# Patient Record
Sex: Female | Born: 1965
Health system: Southern US, Community
[De-identification: ages and names within clinical notes are randomized; demographics above are authoritative.]

## PROBLEM LIST (undated history)

## (undated) DIAGNOSIS — M069 Rheumatoid arthritis, unspecified: Secondary | ICD-10-CM

## (undated) DIAGNOSIS — I10 Essential (primary) hypertension: Secondary | ICD-10-CM

## (undated) DIAGNOSIS — F32A Depression, unspecified: Secondary | ICD-10-CM

## (undated) DIAGNOSIS — M810 Age-related osteoporosis without current pathological fracture: Secondary | ICD-10-CM

## (undated) DIAGNOSIS — D649 Anemia, unspecified: Secondary | ICD-10-CM

## (undated) DIAGNOSIS — F329 Major depressive disorder, single episode, unspecified: Secondary | ICD-10-CM

## (undated) DIAGNOSIS — A599 Trichomoniasis, unspecified: Secondary | ICD-10-CM

## (undated) DIAGNOSIS — G8929 Other chronic pain: Secondary | ICD-10-CM

## (undated) DIAGNOSIS — F4310 Post-traumatic stress disorder, unspecified: Secondary | ICD-10-CM

## (undated) DIAGNOSIS — J449 Chronic obstructive pulmonary disease, unspecified: Secondary | ICD-10-CM

## (undated) DIAGNOSIS — R911 Solitary pulmonary nodule: Secondary | ICD-10-CM

## (undated) HISTORY — DX: Trichomoniasis, unspecified: A59.9

## (undated) HISTORY — PX: TUBAL LIGATION: SHX77

## (undated) HISTORY — PX: ENDOMETRIAL ABLATION: SHX621

## (undated) HISTORY — PX: WISDOM TOOTH EXTRACTION: SHX21

## (undated) HISTORY — DX: Post-traumatic stress disorder, unspecified: F43.10

---

## 1998-01-30 ENCOUNTER — Inpatient Hospital Stay (HOSPITAL_COMMUNITY): Admission: AD | Admit: 1998-01-30 | Discharge: 1998-02-02 | Payer: Self-pay | Admitting: Obstetrics

## 1999-03-04 ENCOUNTER — Inpatient Hospital Stay (HOSPITAL_COMMUNITY): Admission: AD | Admit: 1999-03-04 | Discharge: 1999-03-08 | Payer: Self-pay | Admitting: Otolaryngology

## 2003-02-20 ENCOUNTER — Encounter: Admission: RE | Admit: 2003-02-20 | Discharge: 2003-02-20 | Payer: Self-pay | Admitting: Family Medicine

## 2003-04-06 ENCOUNTER — Emergency Department (HOSPITAL_COMMUNITY): Admission: EM | Admit: 2003-04-06 | Discharge: 2003-04-06 | Payer: Self-pay | Admitting: Emergency Medicine

## 2004-01-22 ENCOUNTER — Ambulatory Visit (HOSPITAL_COMMUNITY): Admission: RE | Admit: 2004-01-22 | Discharge: 2004-01-22 | Payer: Self-pay | Admitting: Family Medicine
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
## 2005-12-28 ENCOUNTER — Emergency Department (HOSPITAL_COMMUNITY): Admission: RE | Admit: 2005-12-28 | Discharge: 2005-12-28 | Payer: Self-pay | Admitting: Emergency Medicine

## 2006-01-26 ENCOUNTER — Emergency Department (HOSPITAL_COMMUNITY): Admission: EM | Admit: 2006-01-26 | Discharge: 2006-01-26 | Payer: Self-pay | Admitting: Emergency Medicine

## 2006-07-02 ENCOUNTER — Emergency Department (HOSPITAL_COMMUNITY): Admission: EM | Admit: 2006-07-02 | Discharge: 2006-07-02 | Payer: Self-pay | Admitting: Emergency Medicine

## 2006-07-14 ENCOUNTER — Ambulatory Visit: Payer: Self-pay | Admitting: Orthopedic Surgery

## 2006-07-28 ENCOUNTER — Emergency Department (HOSPITAL_COMMUNITY): Admission: EM | Admit: 2006-07-28 | Discharge: 2006-07-28 | Payer: Self-pay | Admitting: Emergency Medicine

## 2006-08-04 ENCOUNTER — Ambulatory Visit (HOSPITAL_COMMUNITY): Admission: RE | Admit: 2006-08-04 | Discharge: 2006-08-04 | Payer: Self-pay | Admitting: Family Medicine

## 2006-08-16 ENCOUNTER — Ambulatory Visit (HOSPITAL_COMMUNITY): Admission: RE | Admit: 2006-08-16 | Discharge: 2006-08-16 | Payer: Self-pay | Admitting: Family Medicine

## 2006-09-01 ENCOUNTER — Emergency Department (HOSPITAL_COMMUNITY): Admission: EM | Admit: 2006-09-01 | Discharge: 2006-09-01 | Payer: Self-pay | Admitting: Emergency Medicine

## 2006-10-24 ENCOUNTER — Emergency Department (HOSPITAL_COMMUNITY): Admission: EM | Admit: 2006-10-24 | Discharge: 2006-10-24 | Payer: Self-pay | Admitting: Emergency Medicine

## 2006-11-17 ENCOUNTER — Emergency Department (HOSPITAL_COMMUNITY): Admission: EM | Admit: 2006-11-17 | Discharge: 2006-11-17 | Payer: Self-pay | Admitting: *Deleted

## 2007-04-22 ENCOUNTER — Emergency Department (HOSPITAL_COMMUNITY): Admission: EM | Admit: 2007-04-22 | Discharge: 2007-04-22 | Payer: Self-pay | Admitting: Emergency Medicine

## 2008-01-10 IMAGING — CR DG WRIST COMPLETE 3+V*L*
3 series · 3 of 3 positions shown · non-contrast
Comparison: none

HISTORY: Left wrist pain and swelling

LEFT WRIST 4 VIEWS:
No fracture, dislocation, or bone destruction.
Joint spaces preserved.  
Mineralization normal. 
Soft tissue swelling overlies dorsum of hand on lateral view.

[view not recorded (1 of 3)]
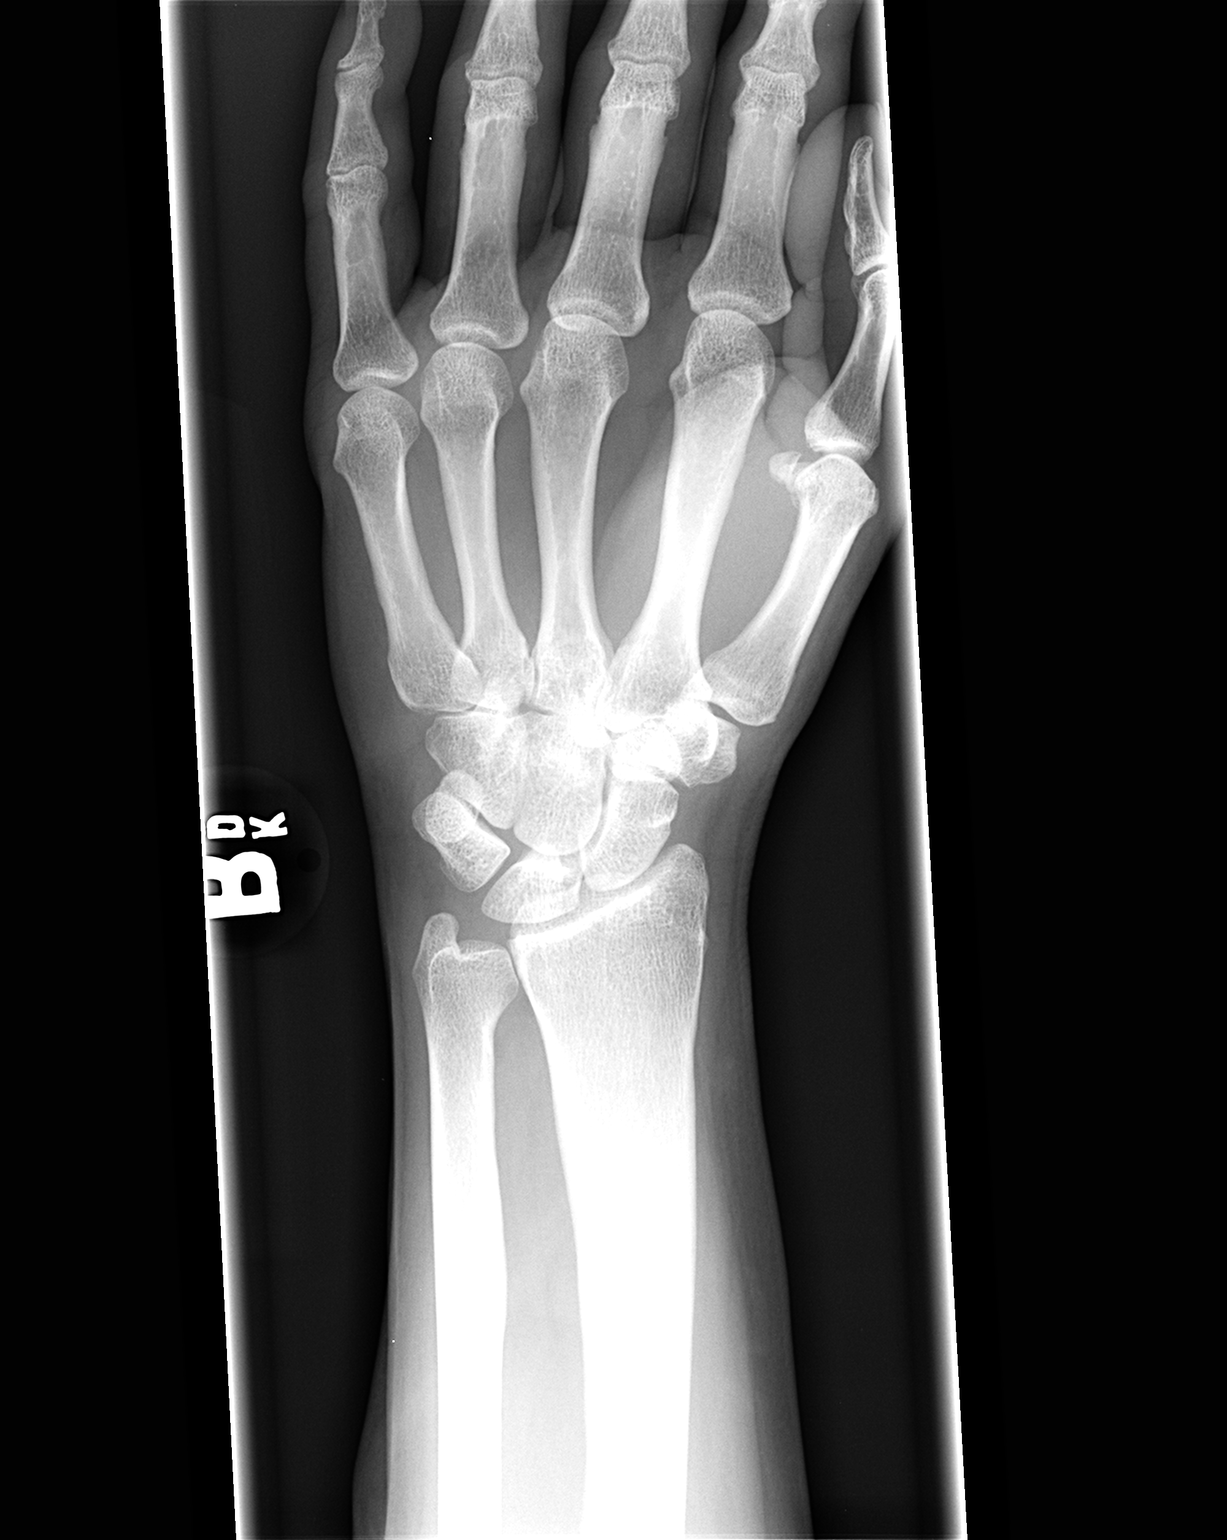

[view not recorded (2 of 3)]
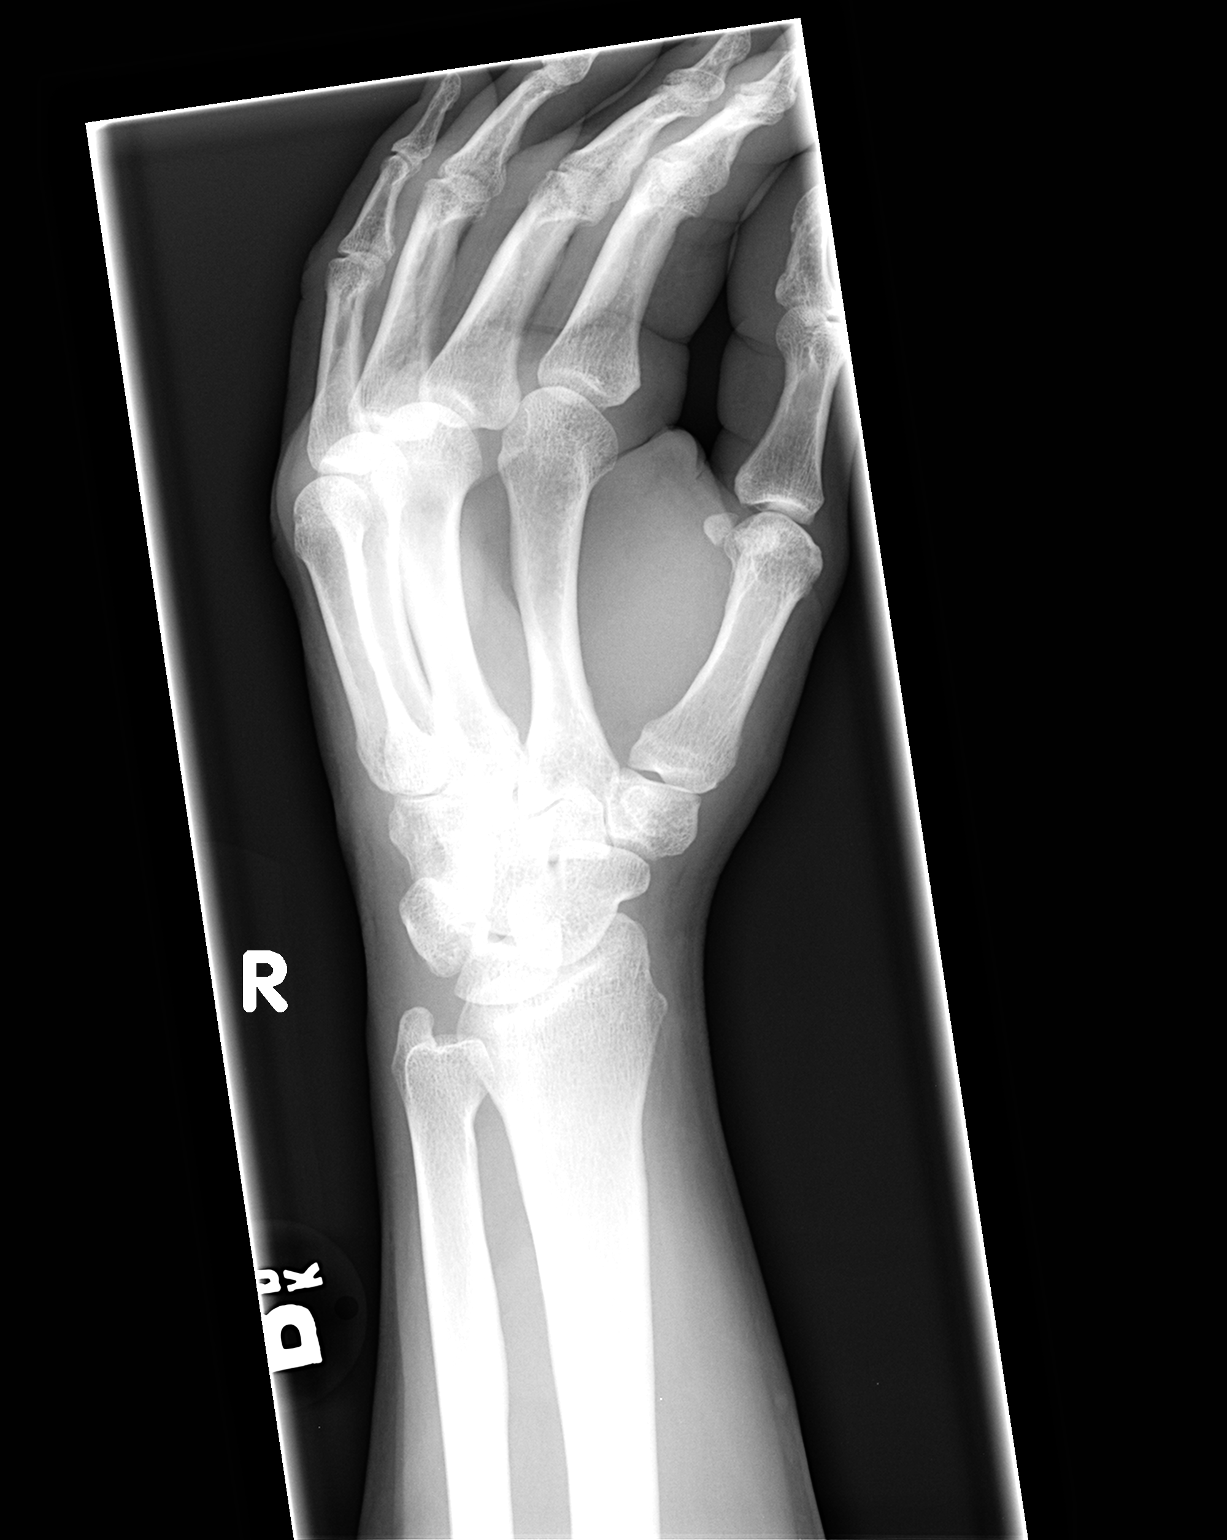

[view not recorded (3 of 3)]
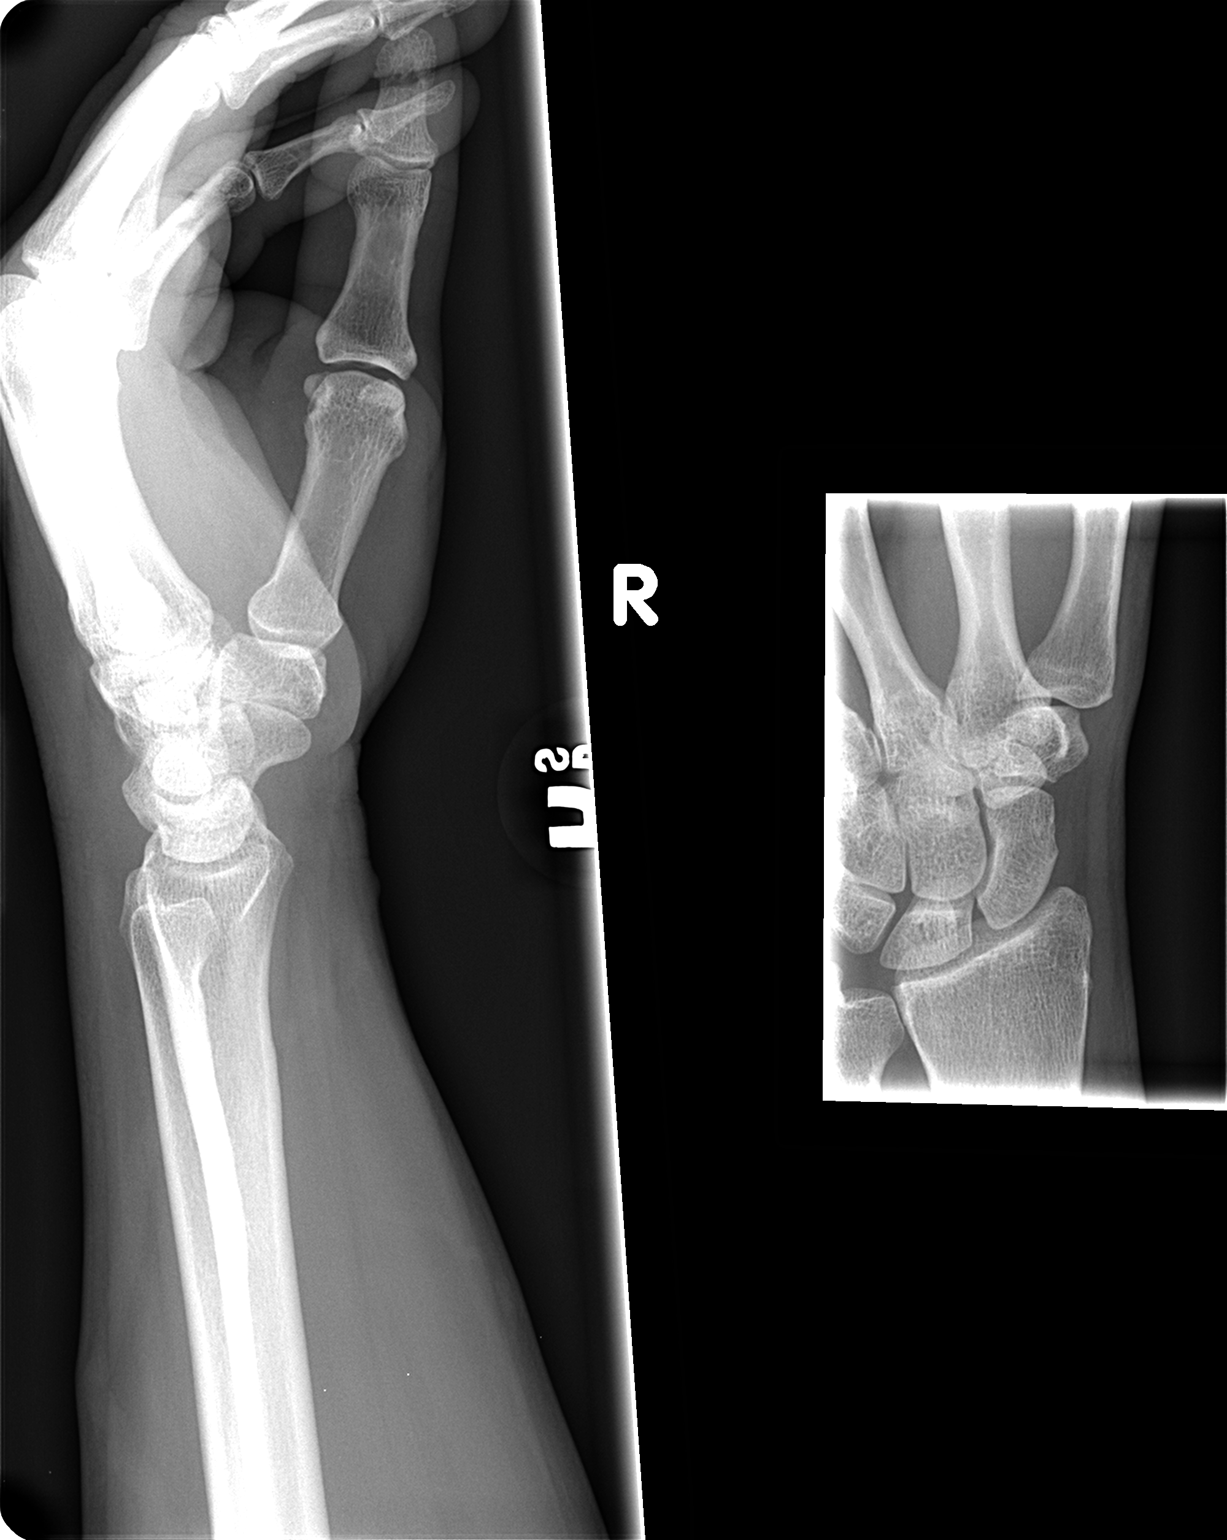

[3 of 3 positions shown; findings below may reference images not displayed]

IMPRESSION: No acute bony abnormalities.

## 2008-03-28 ENCOUNTER — Emergency Department (HOSPITAL_COMMUNITY): Admission: EM | Admit: 2008-03-28 | Discharge: 2008-03-28 | Payer: Self-pay | Admitting: Emergency Medicine

## 2008-05-08 ENCOUNTER — Ambulatory Visit (HOSPITAL_COMMUNITY): Admission: RE | Admit: 2008-05-08 | Discharge: 2008-05-08 | Payer: Self-pay | Admitting: Family Medicine

## 2008-08-16 IMAGING — CR DG HAND COMPLETE 3+V*R*
3 series · 3 of 3 positions shown · non-contrast
Comparison: none

CLINICAL DATA: 41 year-old with arthritis. Bilateral hand and wrist pain and swelling, left greater than right, off and on since [DATE]. No known injury. Possible rheumatoid arthritis.
 RIGHT WRIST ? 4 VIEW:

[view not recorded (1 of 3)]
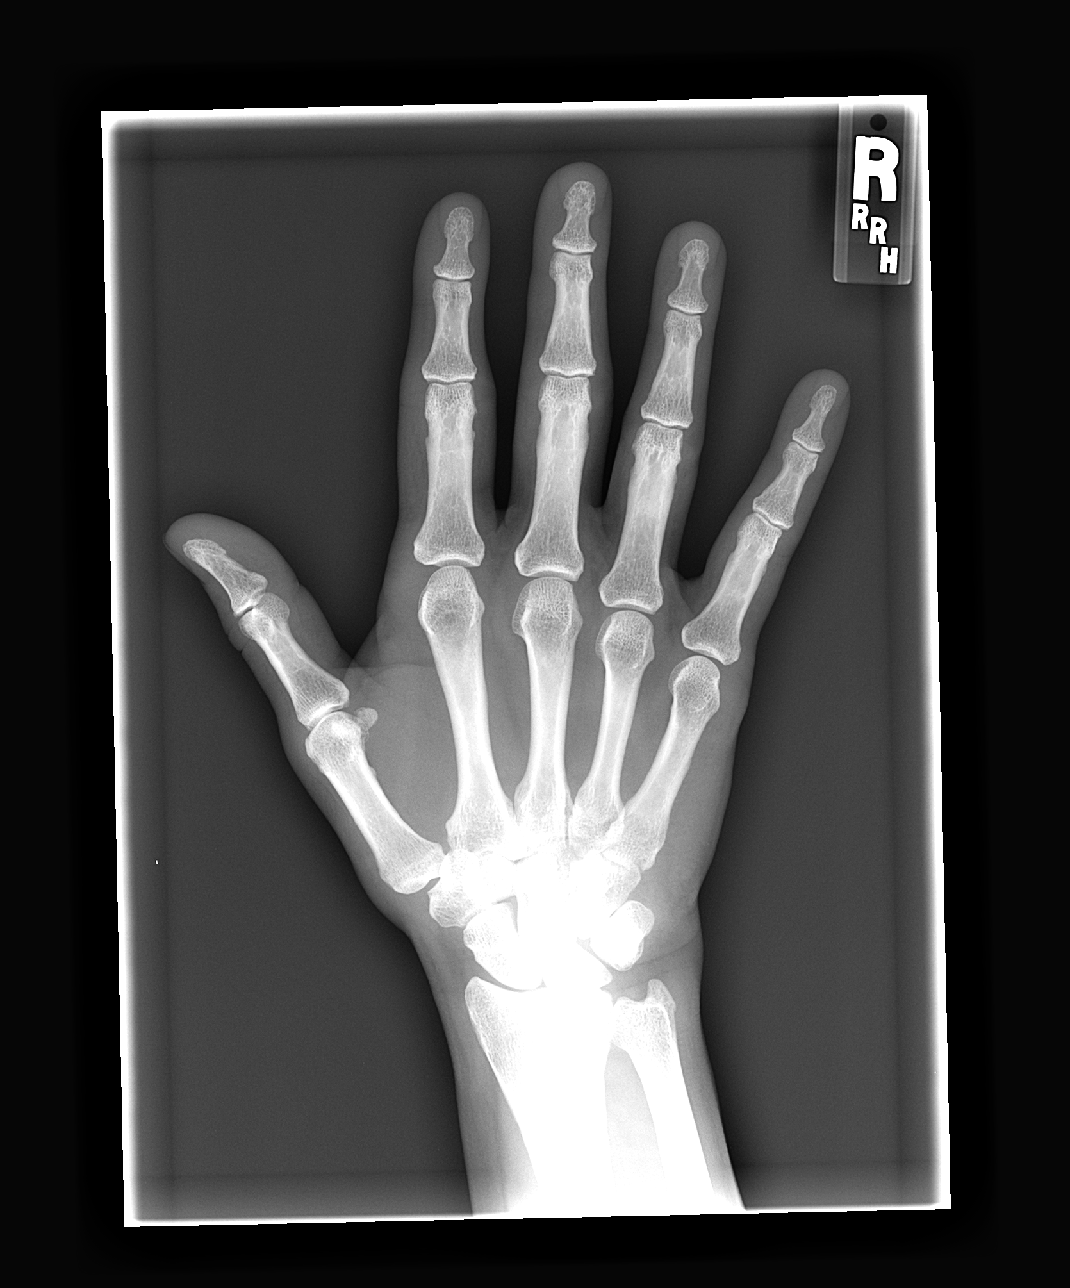

[view not recorded (2 of 3)]
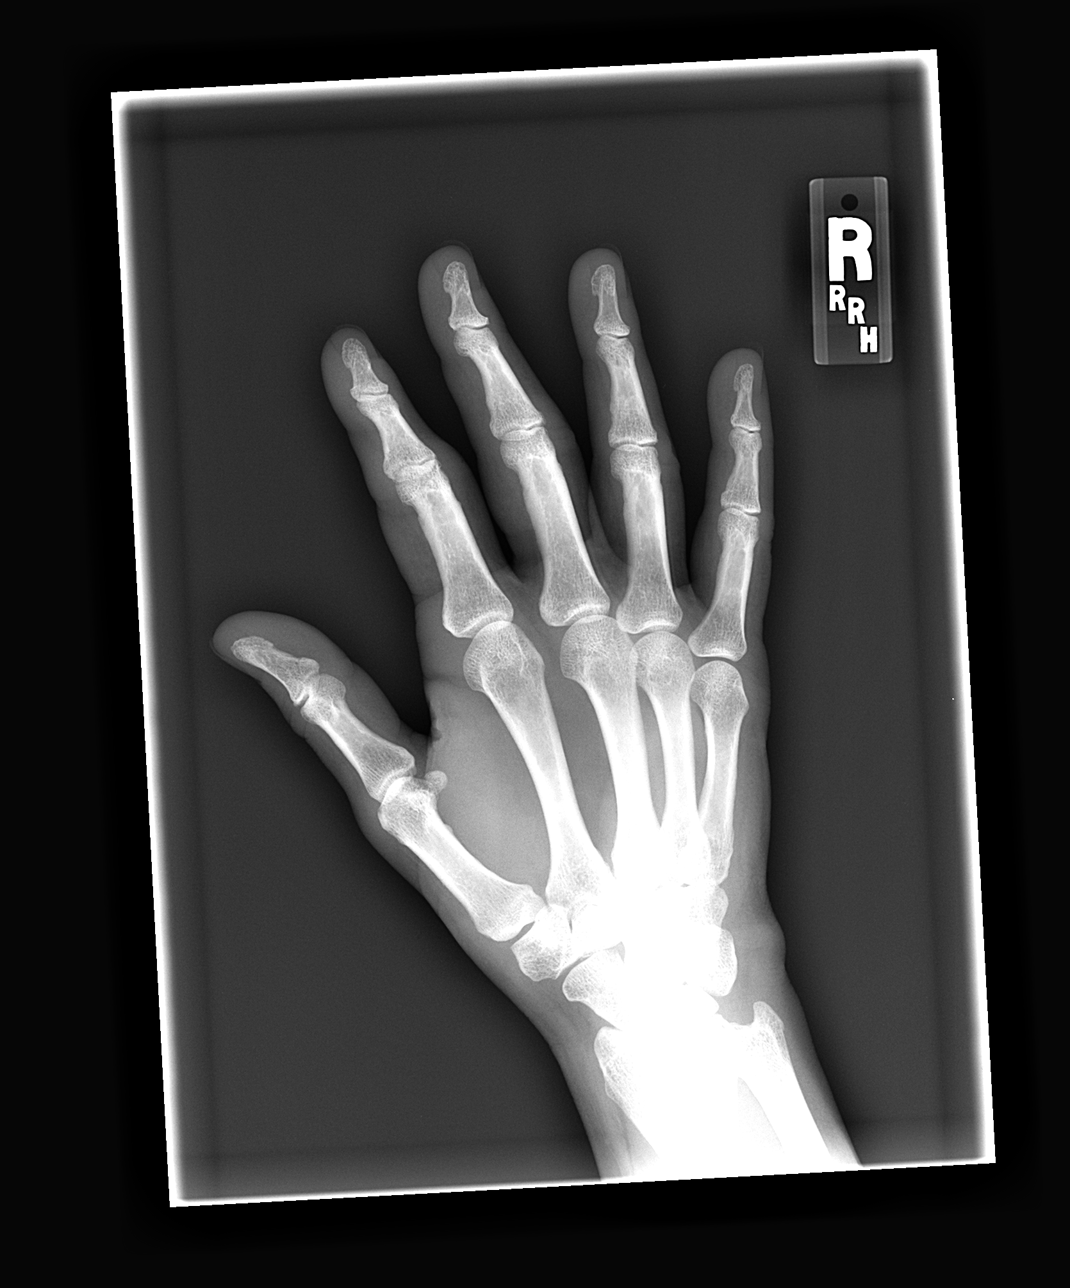

[view not recorded (3 of 3)]
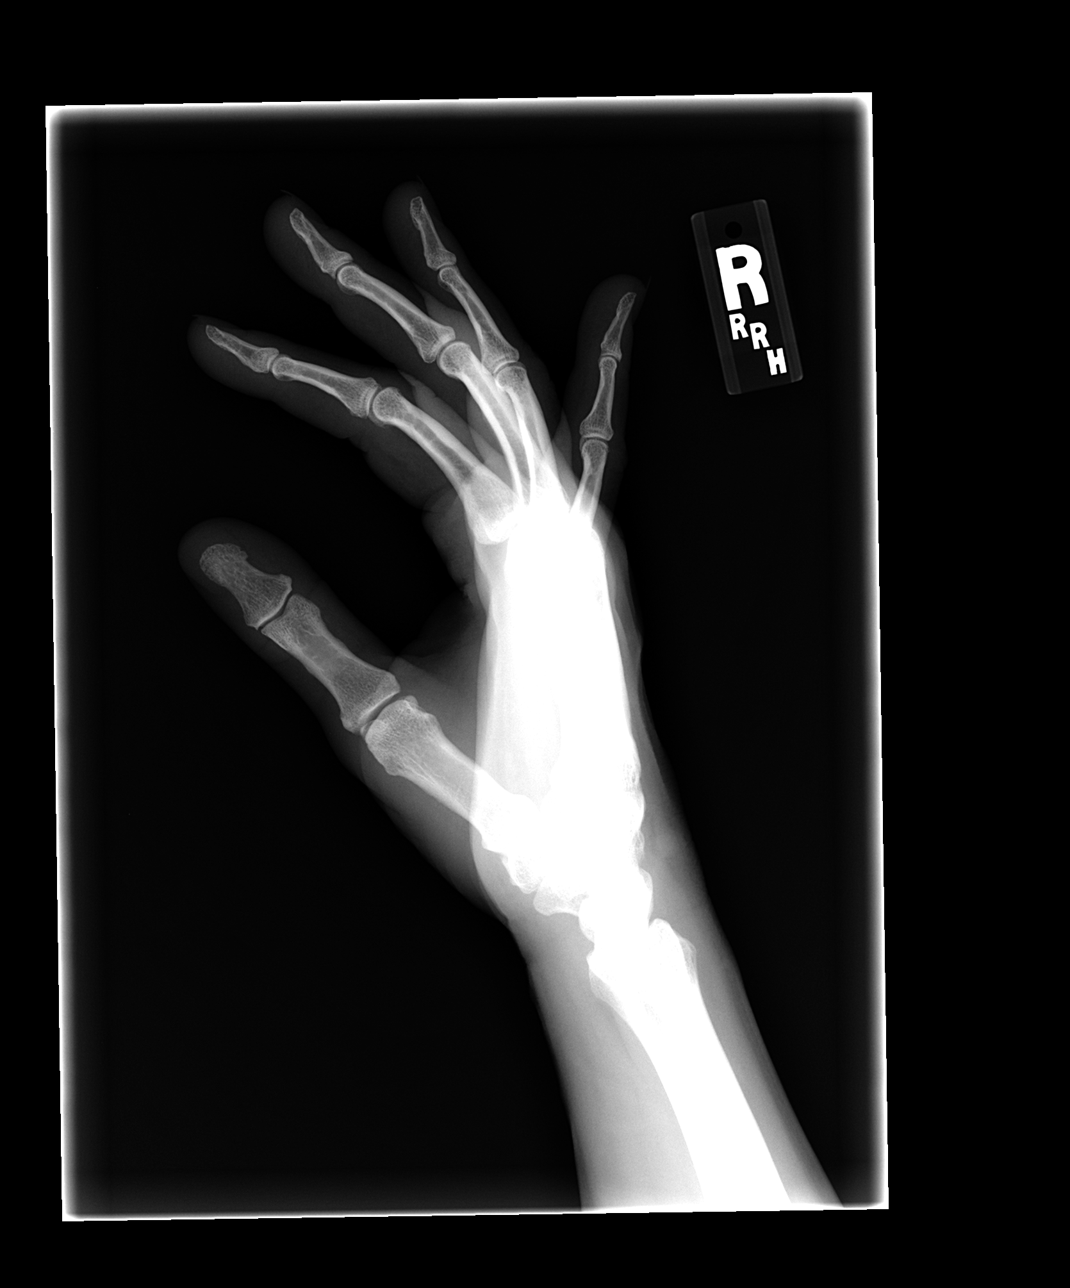

[3 of 3 positions shown; findings below may reference images not displayed]

FINDINGS: There is no evidence of fracture or dislocation.  There is no evidence of arthropathy or other focal bone abnormality.  Soft tissues are unremarkable.
IMPRESSION: Negative.
 RIGHT HAND - 3 VIEW:
 There is no evidence of fracture or dislocation.  There is no evidence of arthropathy or other focal bone abnormality.  Soft tissues are unremarkable.
IMPRESSION: Negative.
 LEFT WRIST - 4 VIEW:
 There is no evidence of fracture or dislocation.  There is no evidence of arthropathy or other focal bone abnormality.  Soft tissues are unremarkable.
IMPRESSION: Negative. 
 LEFT HAND - 3 VIEW:
 There is no evidence of fracture or dislocation.  There is no evidence of arthropathy or other focal bone abnormality.  Soft tissues are unremarkable.
IMPRESSION: Negative.

## 2008-08-16 IMAGING — CR DG WRIST COMPLETE 3+V*R*
2 series · 2 of 2 positions shown · non-contrast
Comparison: none

CLINICAL DATA: 41 year-old with arthritis. Bilateral hand and wrist pain and swelling, left greater than right, off and on since [DATE]. No known injury. Possible rheumatoid arthritis.
 RIGHT WRIST ? 4 VIEW:

[view not recorded (1 of 2)]
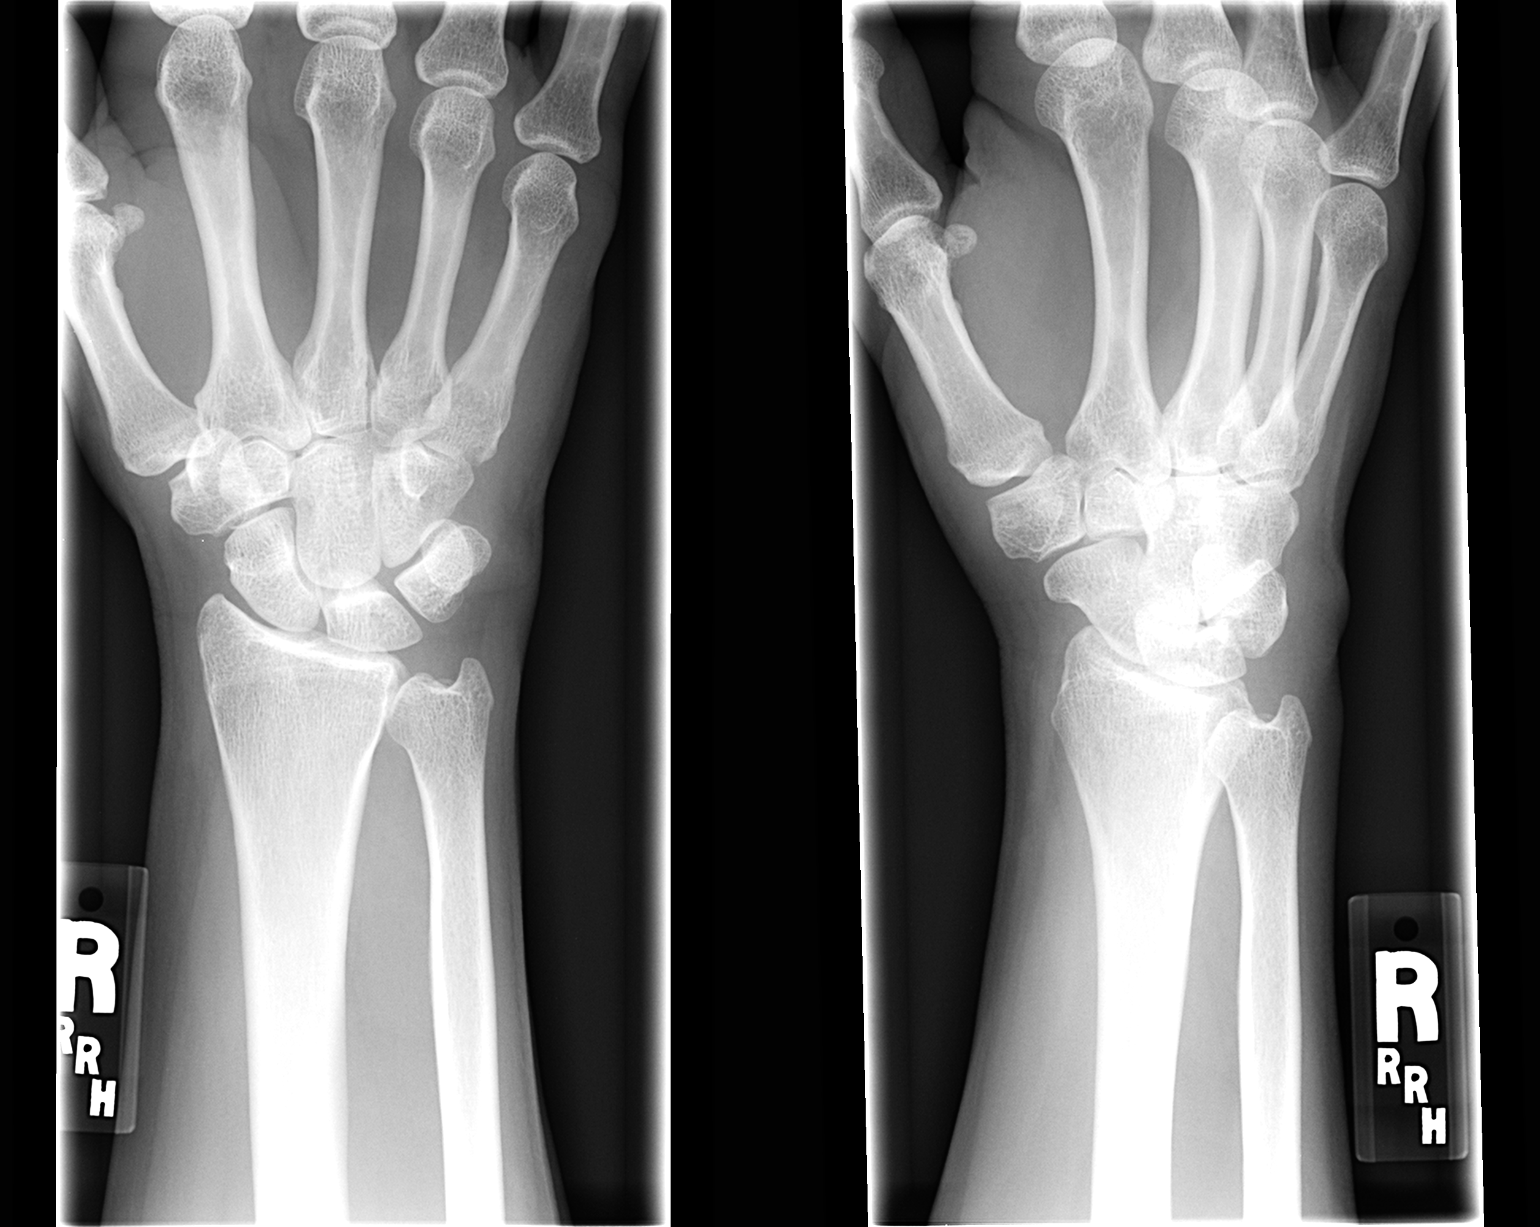

[view not recorded (2 of 2)]
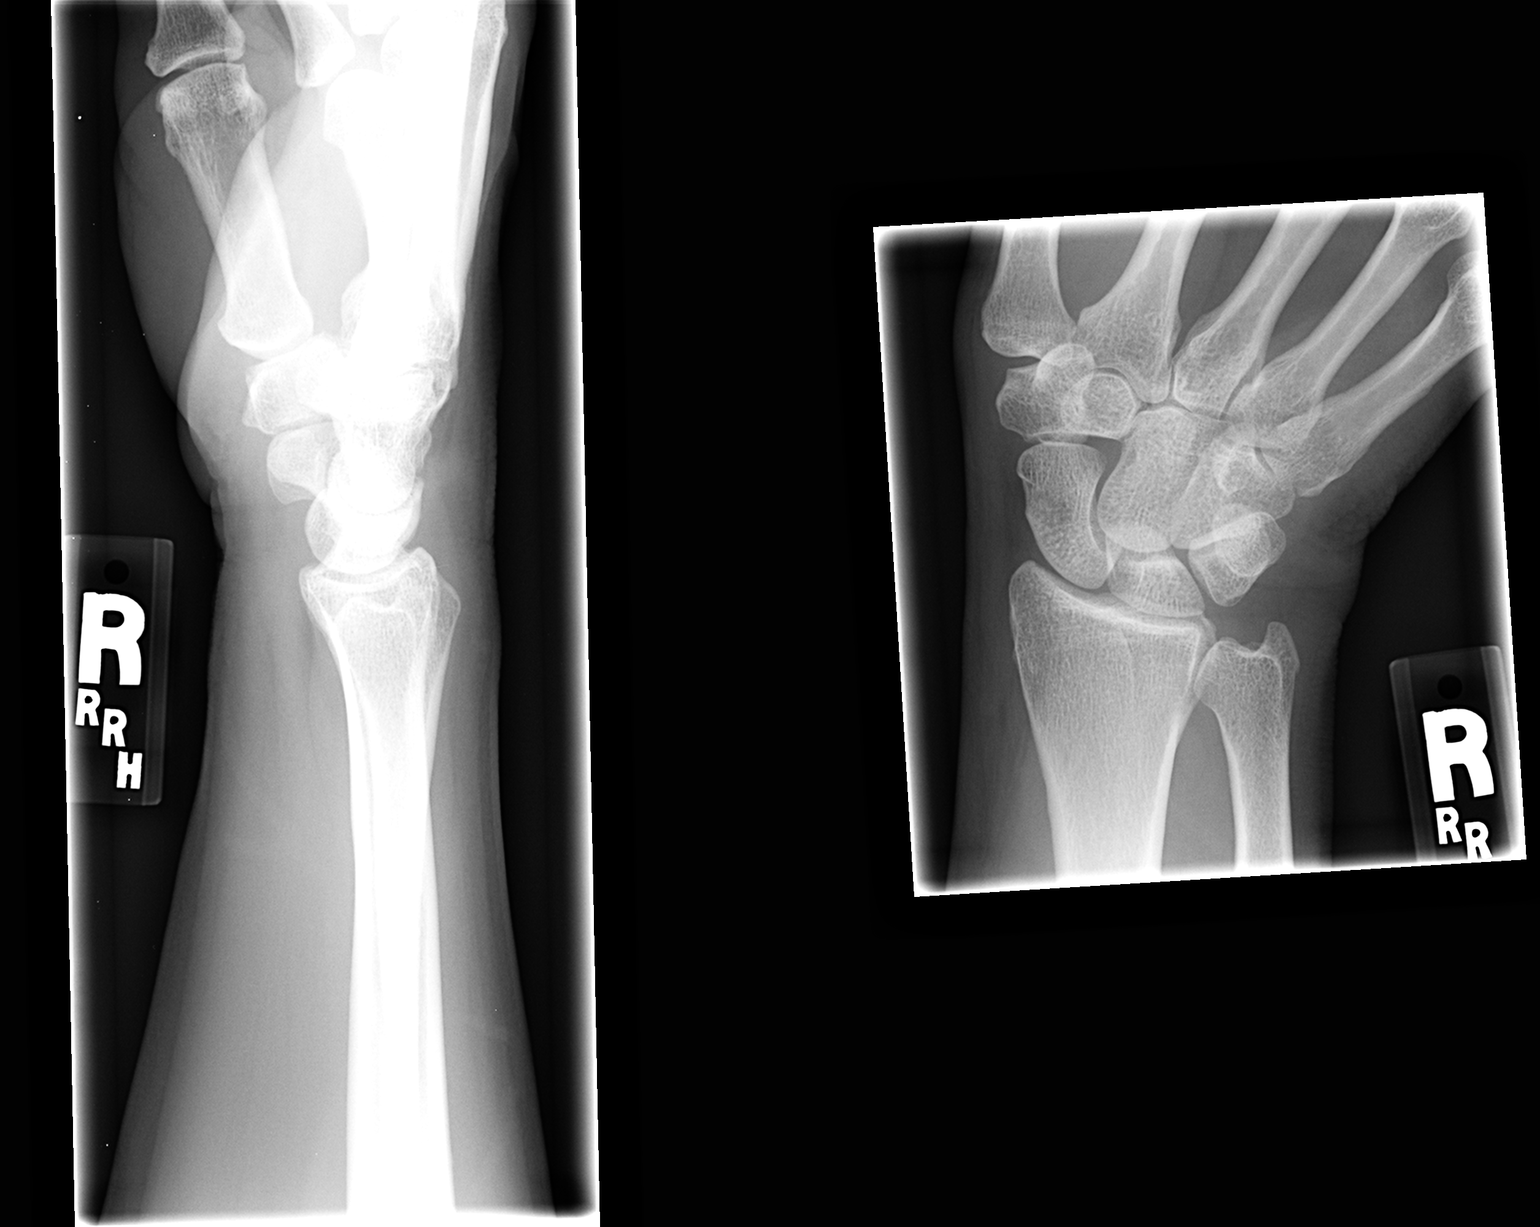

[2 of 2 positions shown; findings below may reference images not displayed]

FINDINGS: There is no evidence of fracture or dislocation.  There is no evidence of arthropathy or other focal bone abnormality.  Soft tissues are unremarkable.
IMPRESSION: Negative.
 RIGHT HAND - 3 VIEW:
 There is no evidence of fracture or dislocation.  There is no evidence of arthropathy or other focal bone abnormality.  Soft tissues are unremarkable.
IMPRESSION: Negative.
 LEFT WRIST - 4 VIEW:
 There is no evidence of fracture or dislocation.  There is no evidence of arthropathy or other focal bone abnormality.  Soft tissues are unremarkable.
IMPRESSION: Negative. 
 LEFT HAND - 3 VIEW:
 There is no evidence of fracture or dislocation.  There is no evidence of arthropathy or other focal bone abnormality.  Soft tissues are unremarkable.
IMPRESSION: Negative.

## 2008-08-16 IMAGING — CR DG HAND COMPLETE 3+V*L*
3 series · 3 of 3 positions shown · non-contrast
Comparison: none

CLINICAL DATA: 41 year-old with arthritis. Bilateral hand and wrist pain and swelling, left greater than right, off and on since [DATE]. No known injury. Possible rheumatoid arthritis.
 RIGHT WRIST ? 4 VIEW:

[view not recorded (1 of 3)]
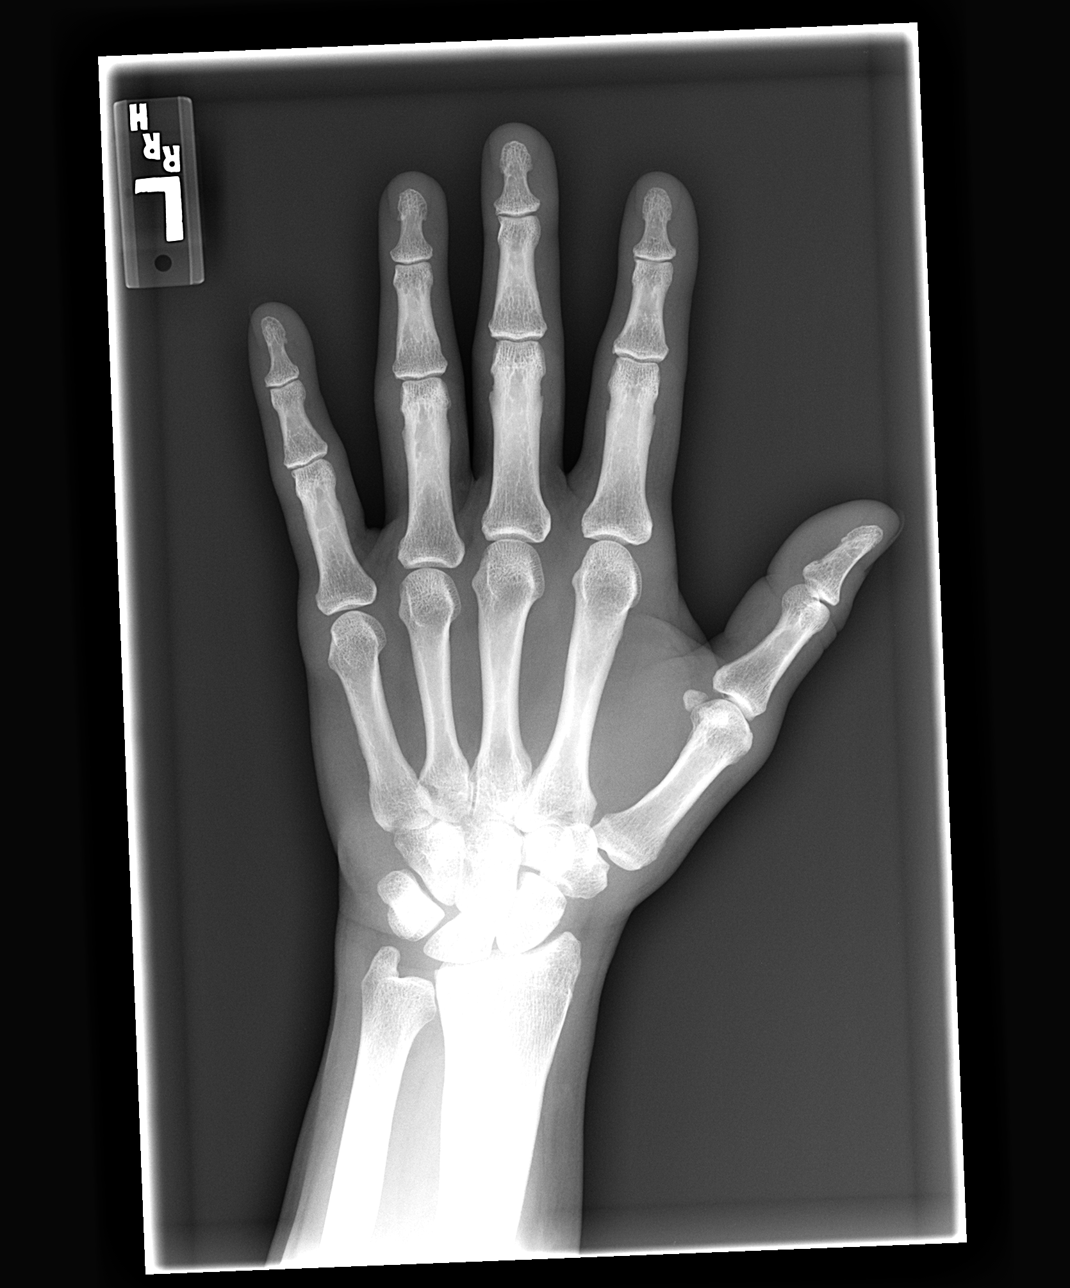

[view not recorded (2 of 3)]
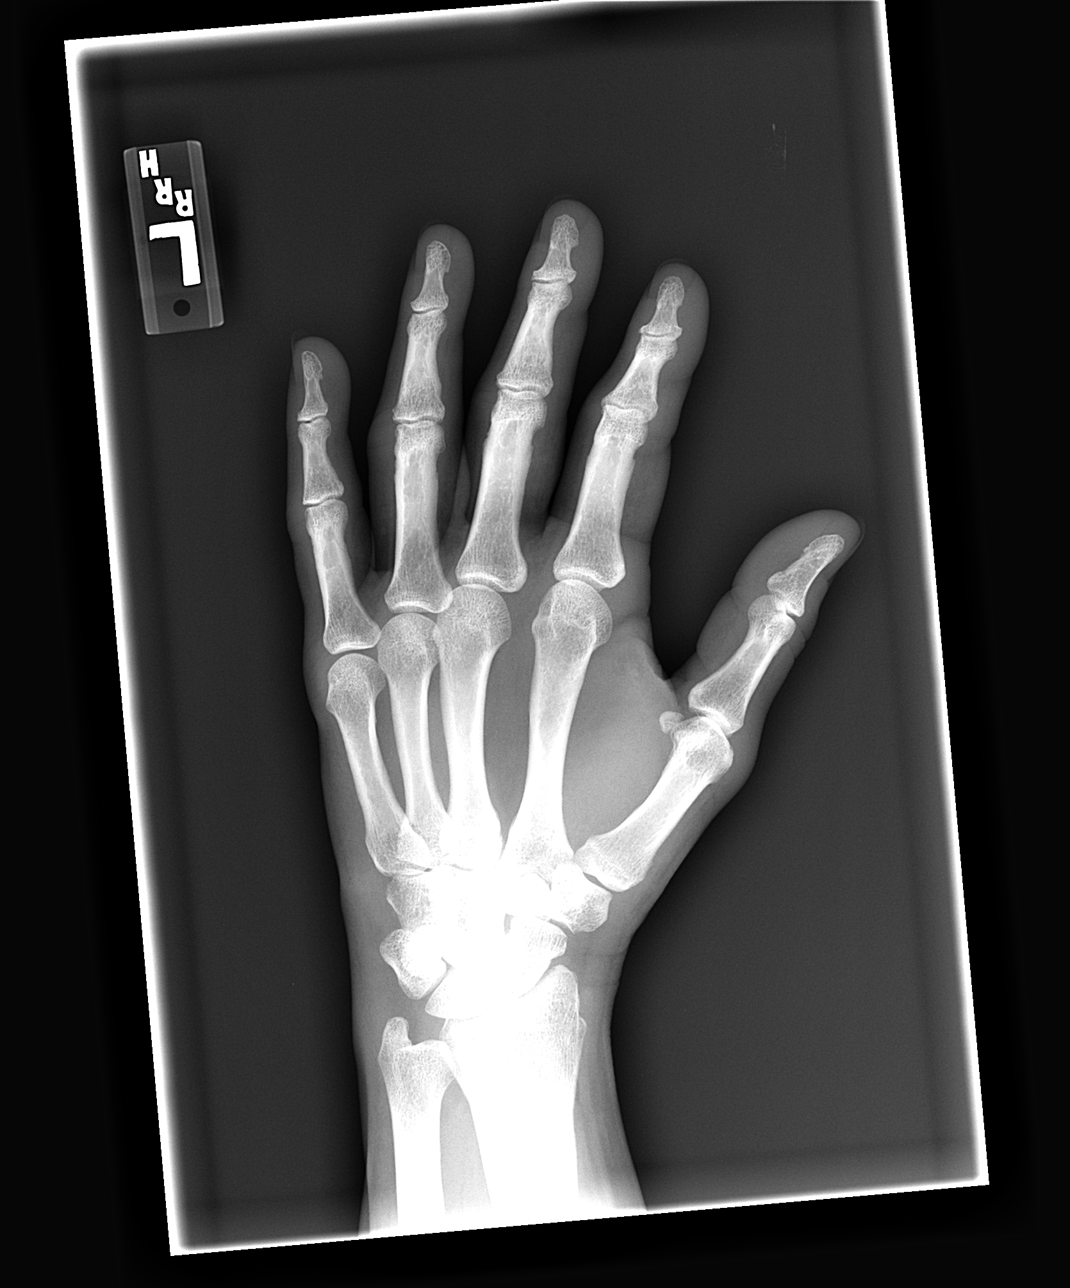

[view not recorded (3 of 3)]
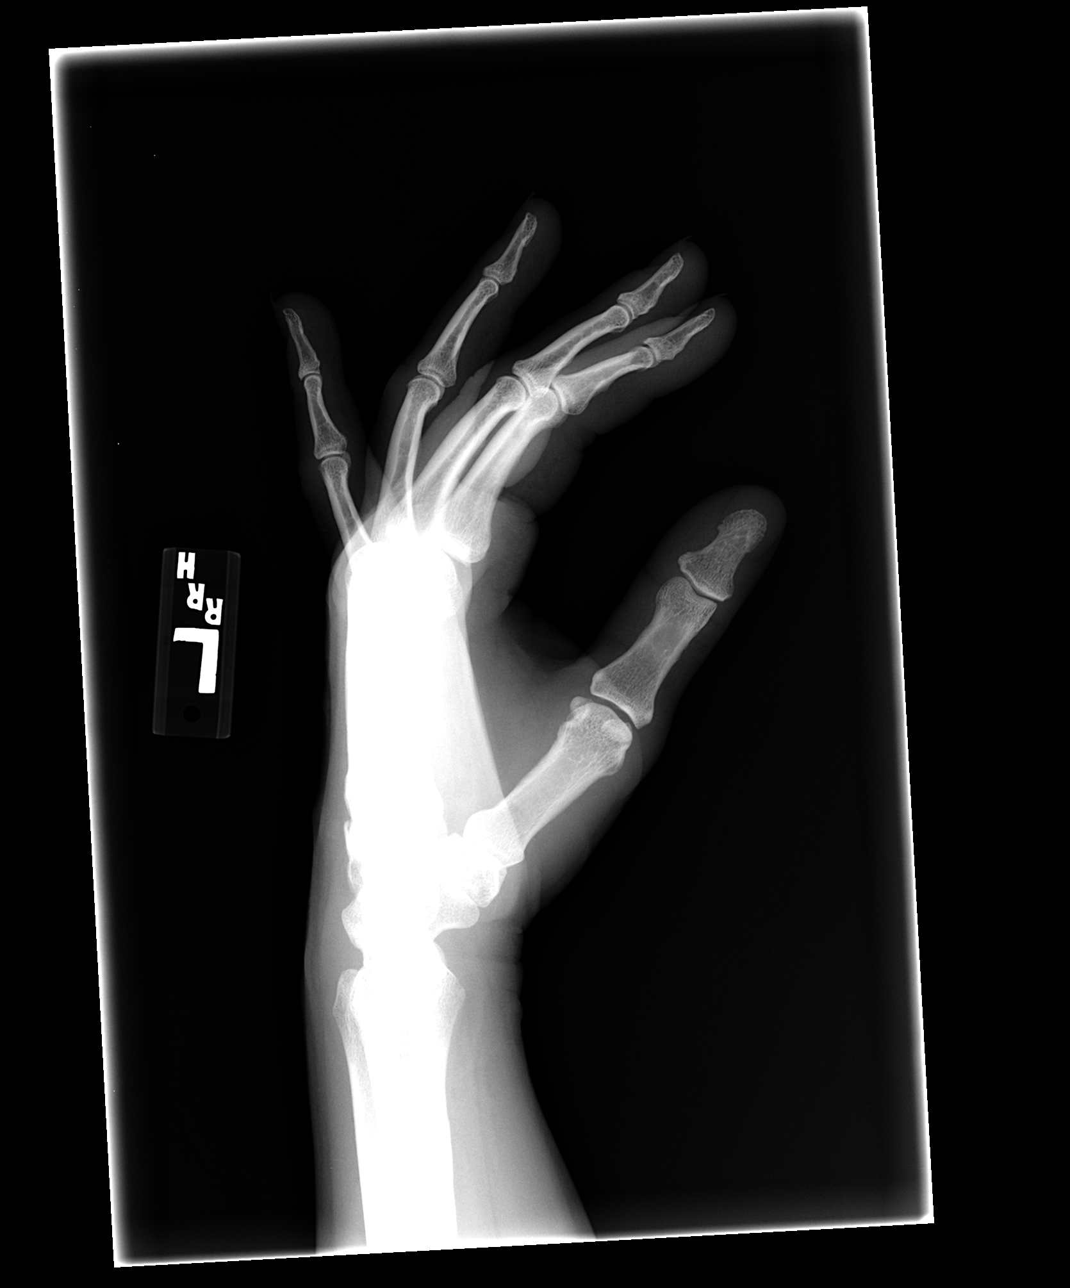

[3 of 3 positions shown; findings below may reference images not displayed]

FINDINGS: There is no evidence of fracture or dislocation.  There is no evidence of arthropathy or other focal bone abnormality.  Soft tissues are unremarkable.
IMPRESSION: Negative.
 RIGHT HAND - 3 VIEW:
 There is no evidence of fracture or dislocation.  There is no evidence of arthropathy or other focal bone abnormality.  Soft tissues are unremarkable.
IMPRESSION: Negative.
 LEFT WRIST - 4 VIEW:
 There is no evidence of fracture or dislocation.  There is no evidence of arthropathy or other focal bone abnormality.  Soft tissues are unremarkable.
IMPRESSION: Negative. 
 LEFT HAND - 3 VIEW:
 There is no evidence of fracture or dislocation.  There is no evidence of arthropathy or other focal bone abnormality.  Soft tissues are unremarkable.
IMPRESSION: Negative.

## 2008-08-28 IMAGING — RF DG BE W/ AIR HIGH DENSITY
12 of 24 series · 12 of 24 positions shown · non-contrast
Comparison: none

HISTORY: Anemia, blood in stool

[Series 2: run · 1 of 1 slices shown (1 of 12)]
[im 1/1]
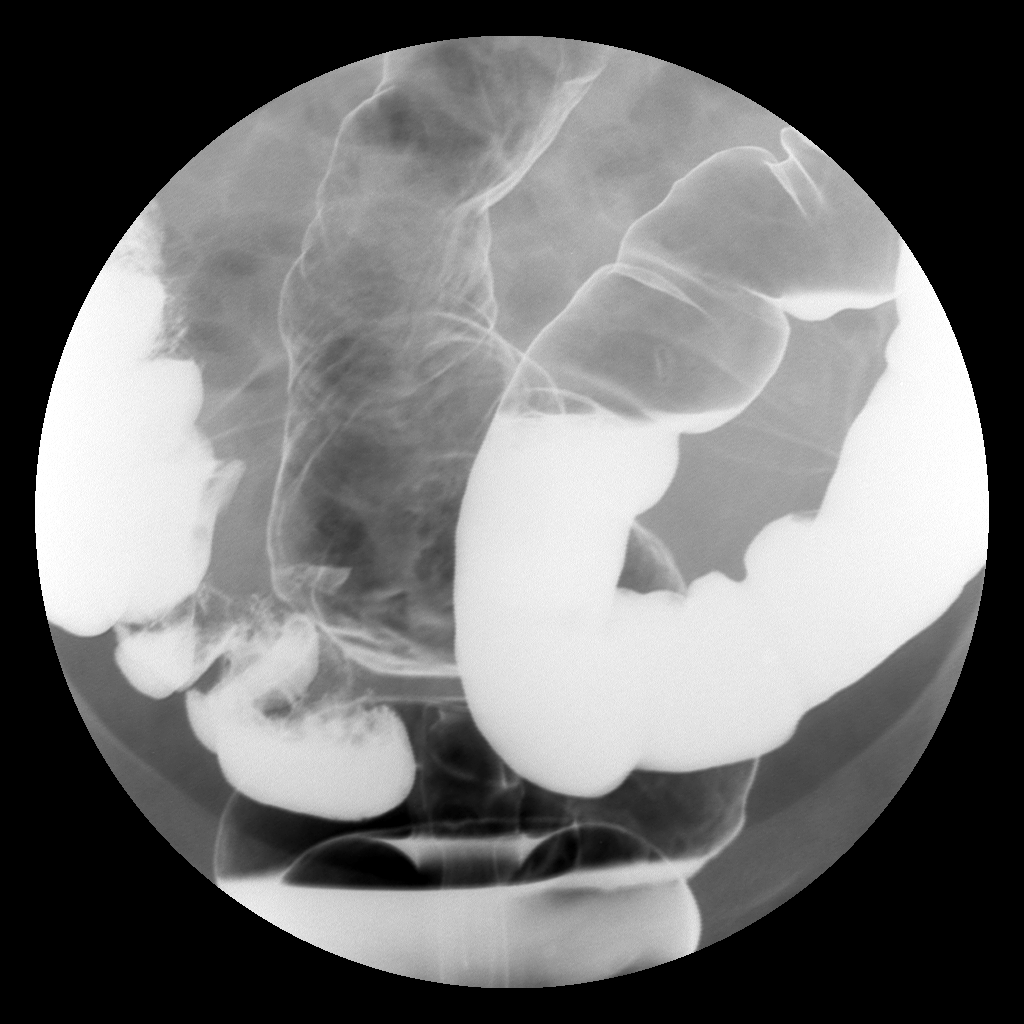

[Series 4: run · 1 of 1 slices shown (2 of 12)]
[im 1/1]
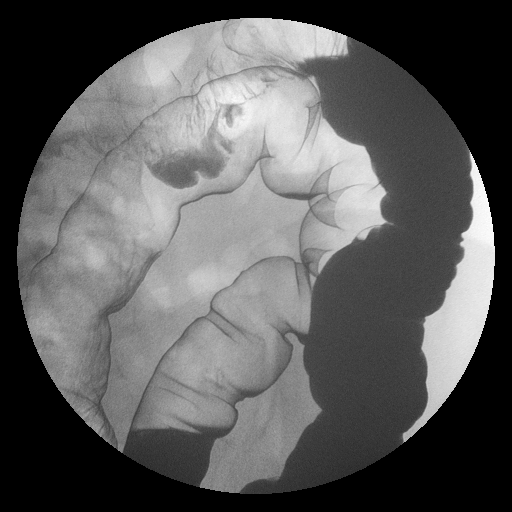

[Series 6: run · 1 of 1 slices shown (3 of 12)]
[im 1/1]
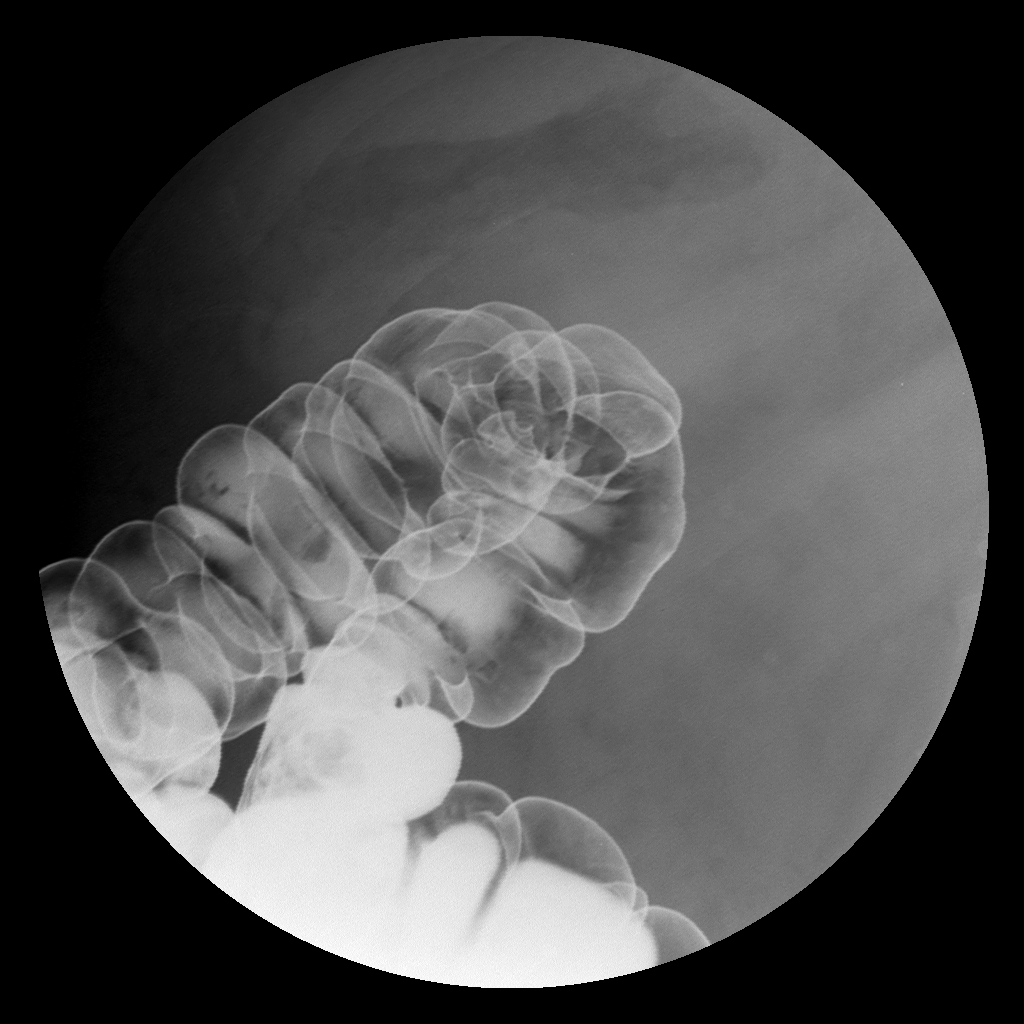

[Series 8: run · 1 of 1 slices shown (4 of 12)]
[im 1/1]
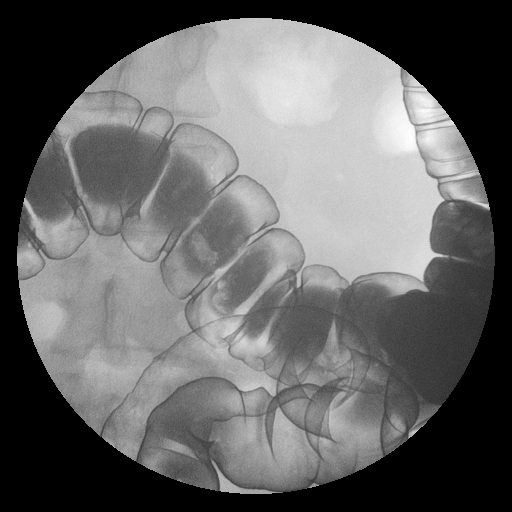

[Series 10: run · 1 of 1 slices shown (5 of 12)]
[im 1/1]
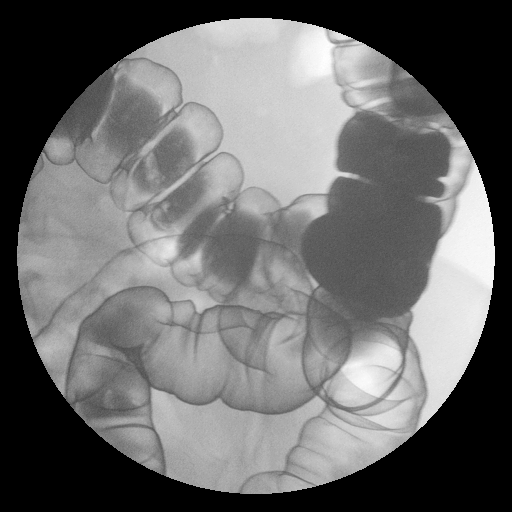

[Series 12: run · 1 of 1 slices shown (6 of 12)]
[im 1/1]
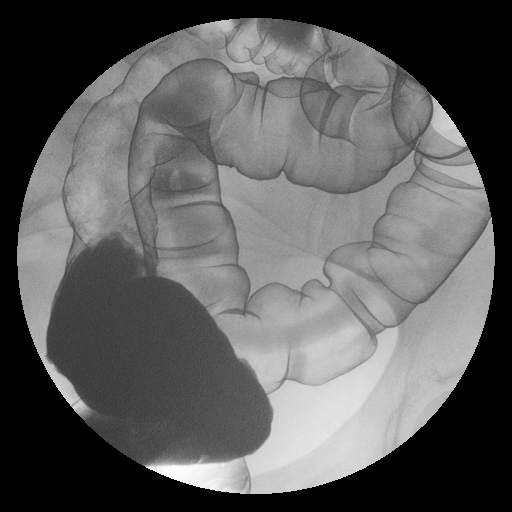

[Series 14: run · 1 of 1 slices shown (7 of 12)]
[im 1/1]
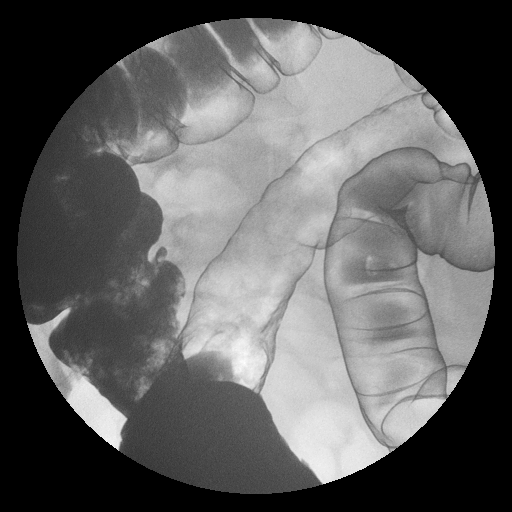

[Series 16: run · 1 of 1 slices shown (8 of 12)]
[im 1/1]
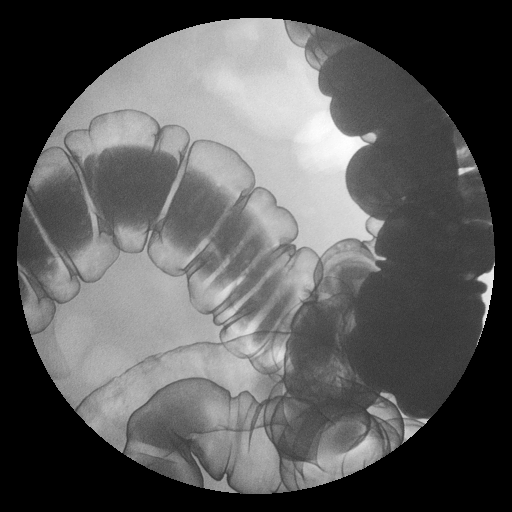

[Series 18: run · 1 of 1 slices shown (9 of 12)]
[im 1/1]
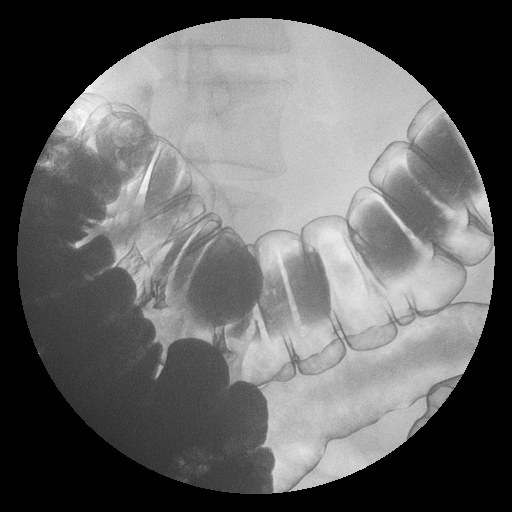

[Series 20: run · 1 of 1 slices shown (10 of 12)]
[im 1/1]
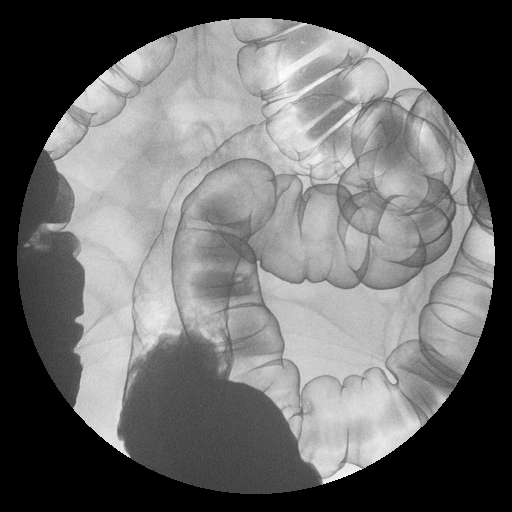

[Series 22: run · 1 of 1 slices shown (11 of 12)]
[im 1/1]
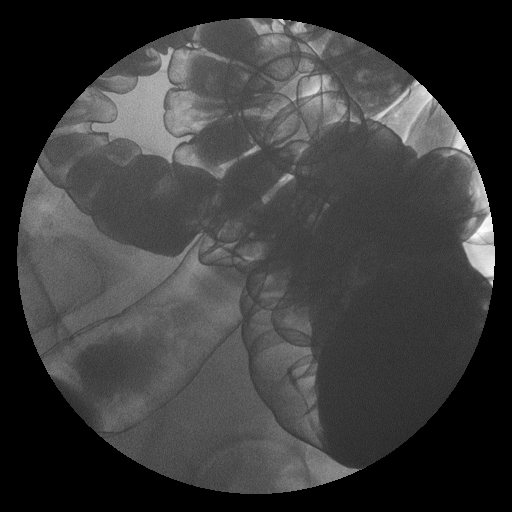

[Series 24: run · 1 of 1 slices shown (12 of 12)]
[im 1/1]
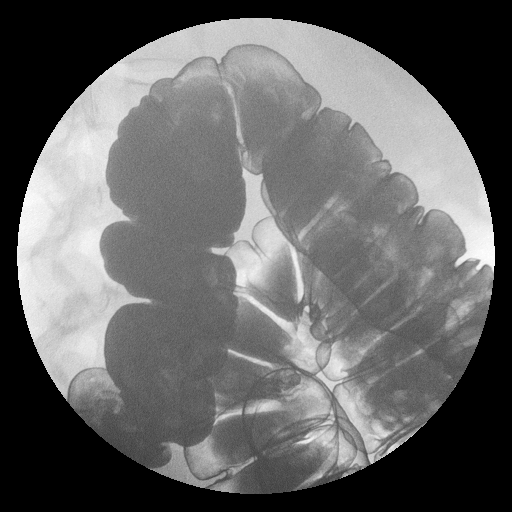

[12 of 24 positions shown; findings below may reference images not displayed]

BARIUM ENEMA WITH AIR WITH HIGH DENSITY:

Normal bowel gas pattern on scout image.
Pelvic phleboliths and question tubal ligation rings.
No bone abnormality.
Routine air-contrast BE performed.

Free reflux of contrast from rectum to tip of cecum without obstruction.
Normal appearing vermiform appendix visualized.
Incompetent ileocecal bowel with reflux of contrast into normal appearing
terminal ileal loops.
Mild stool debris noted in ascending colon through hepatic flexure.
No definite persistent intraluminal filling defects or mass identified.
Mucosa smooth without ulceration or evidence of diverticula.
No significant abnormality seen.
IMPRESSION: Stool retention in right colon.
Otherwise unremarkable air-contrast barium enema.

## 2008-09-13 IMAGING — CR DG SHOULDER 2+V*R*
3 series · 3 of 3 positions shown · non-contrast
Comparison: none

CLINICAL DATA: Fall, right shoulder pain.

RIGHT SHOULDER - 3 VIEW

[view not recorded (1 of 3)]
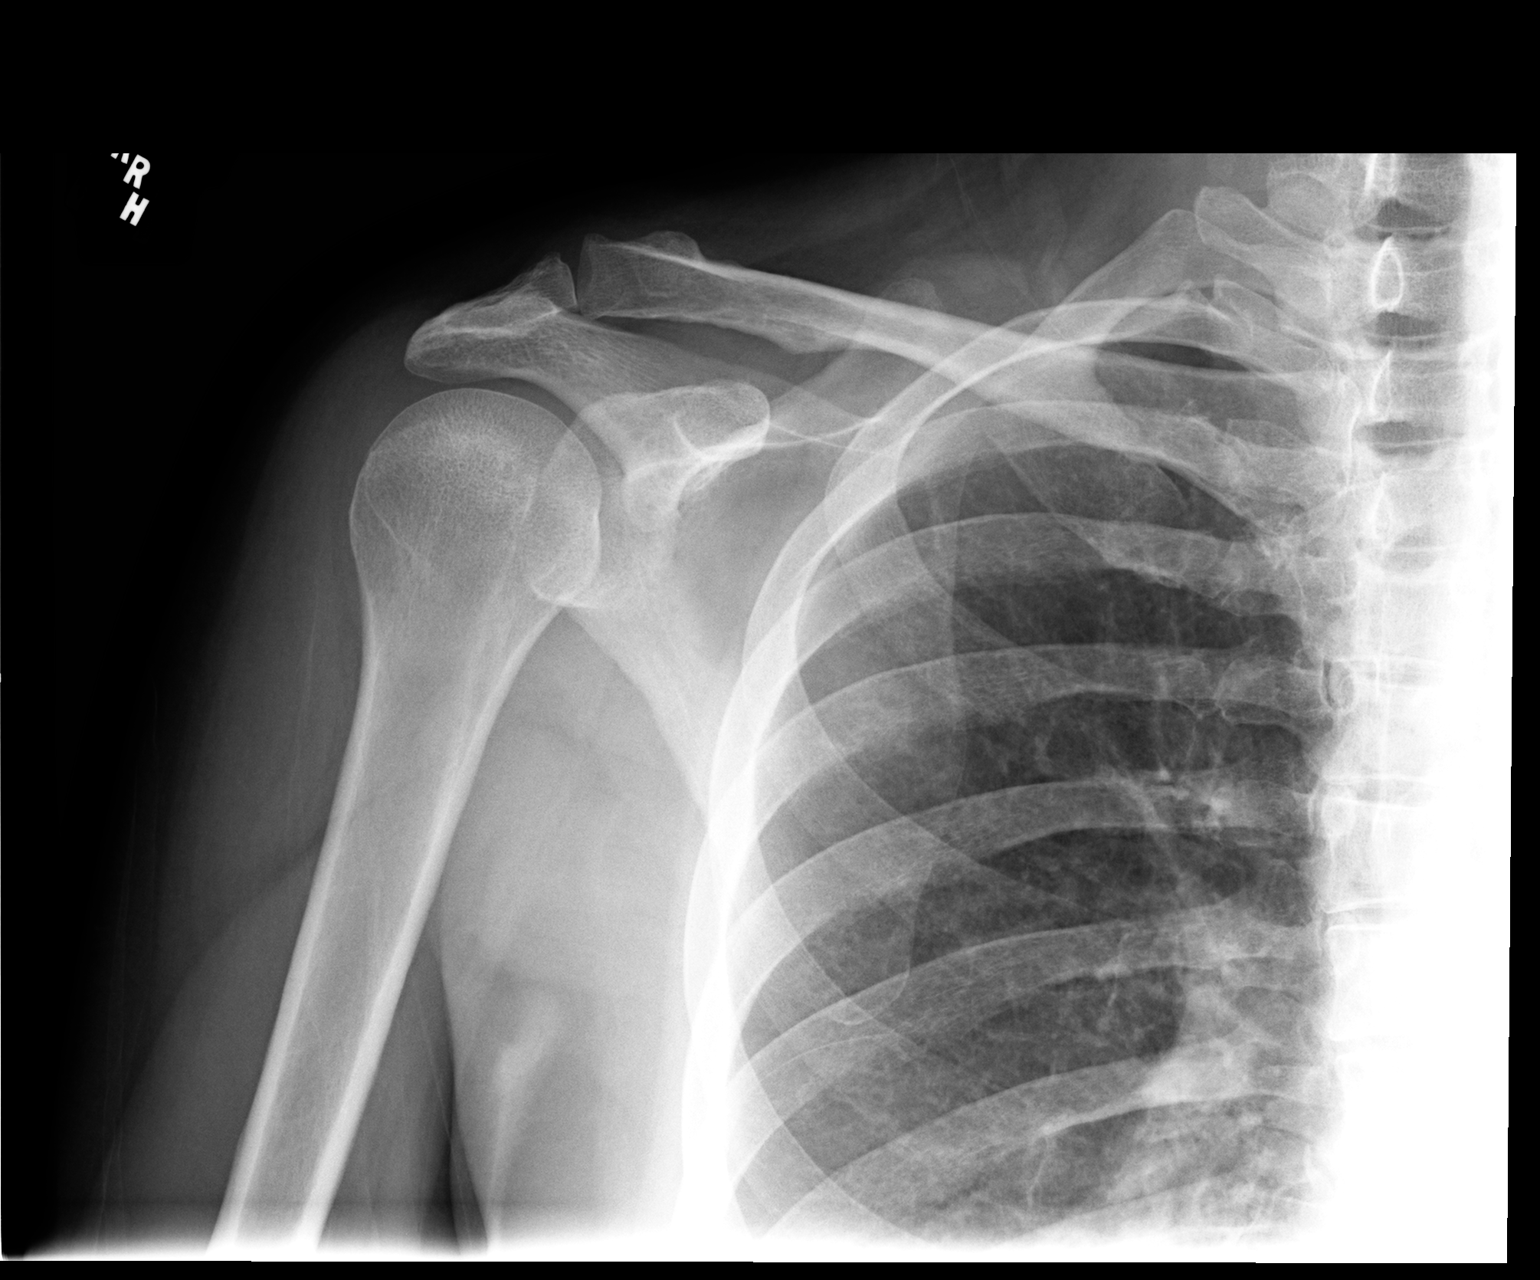

[view not recorded (2 of 3)]
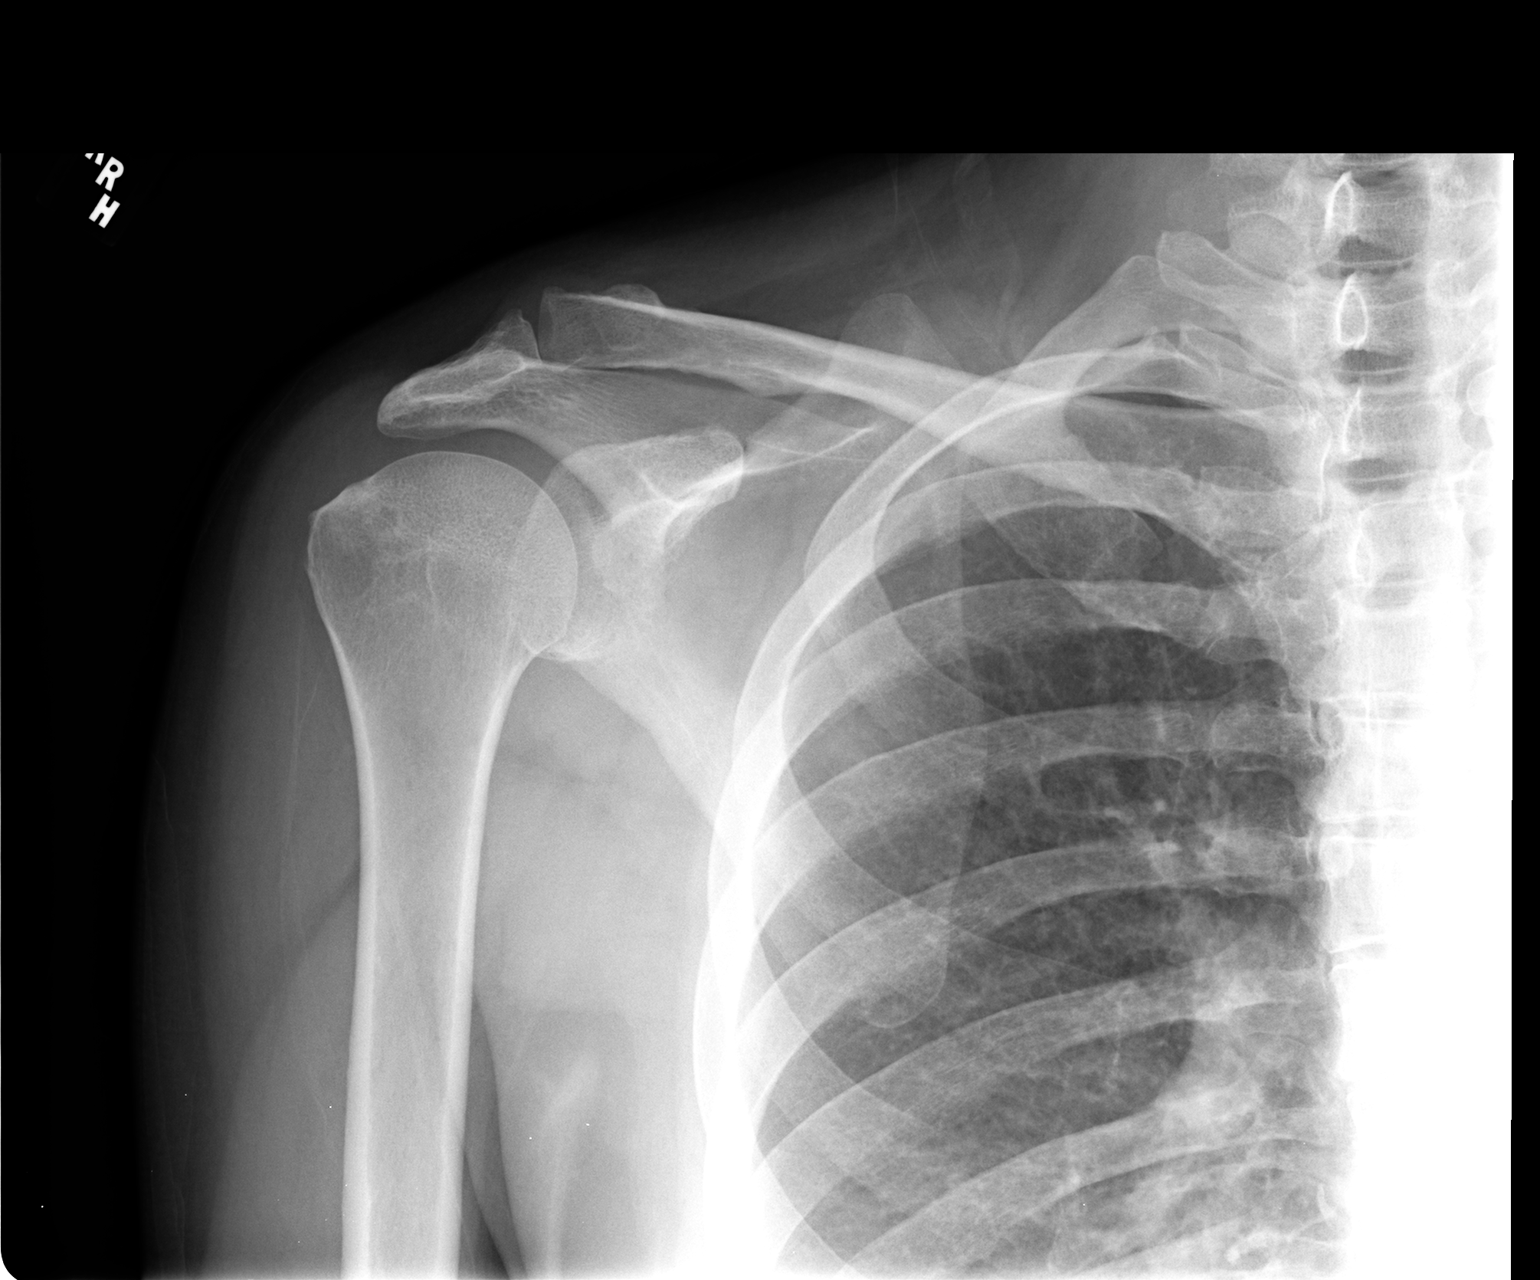

[view not recorded (3 of 3)]
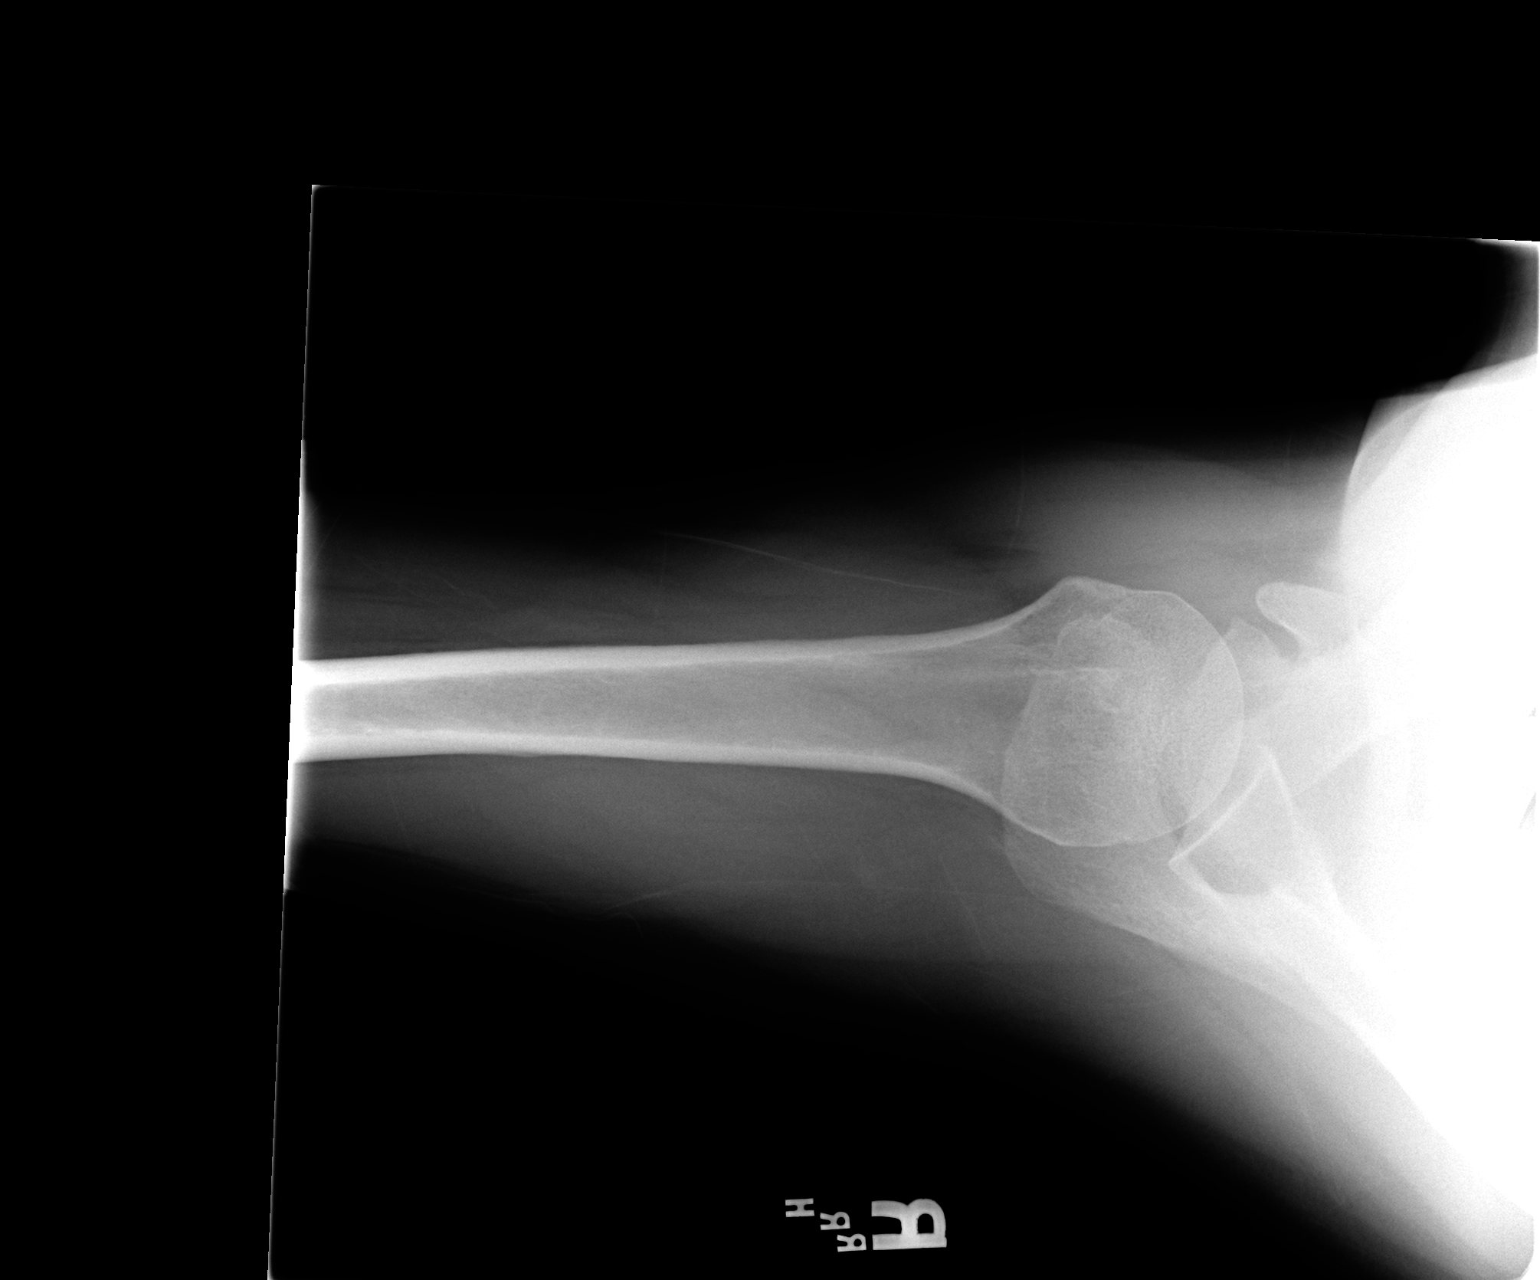

[3 of 3 positions shown; findings below may reference images not displayed]

FINDINGS: Right shoulder located. AC joint osteoarthritis. No fracture.

IMPRESSION

Degenerative change without acute abnormalities.

## 2008-09-13 IMAGING — CR DG KNEE COMPLETE 4+V*L*
4 series · 4 of 4 positions shown · non-contrast
Comparison: None .

CLINICAL DATA: Fall, knee pain.

LEFT KNEE - 4 VIEW

[view not recorded (1 of 4)]
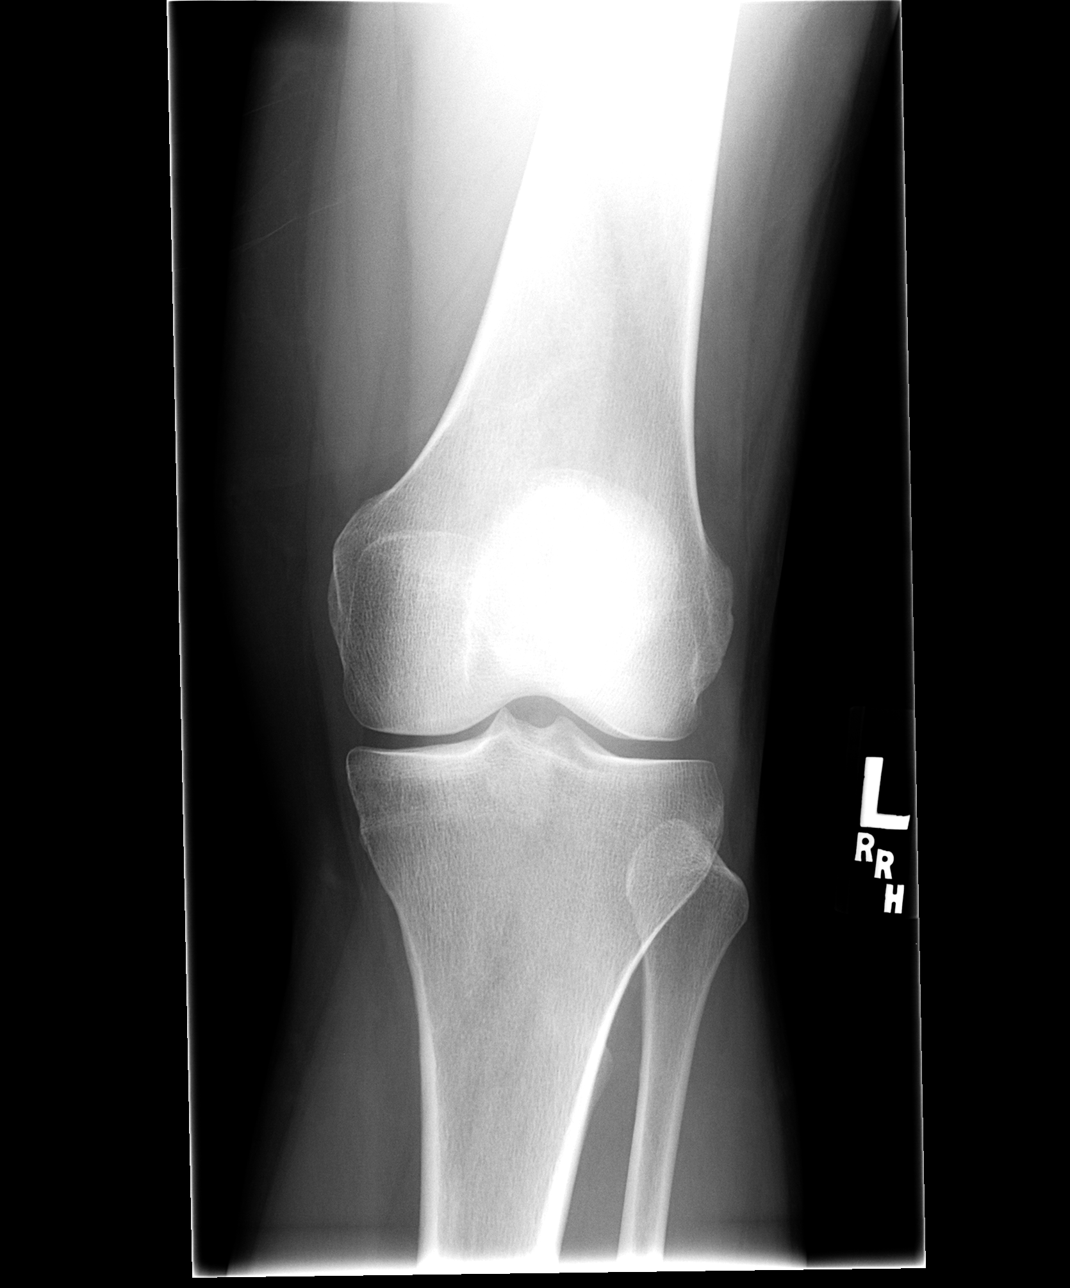

[view not recorded (2 of 4)]
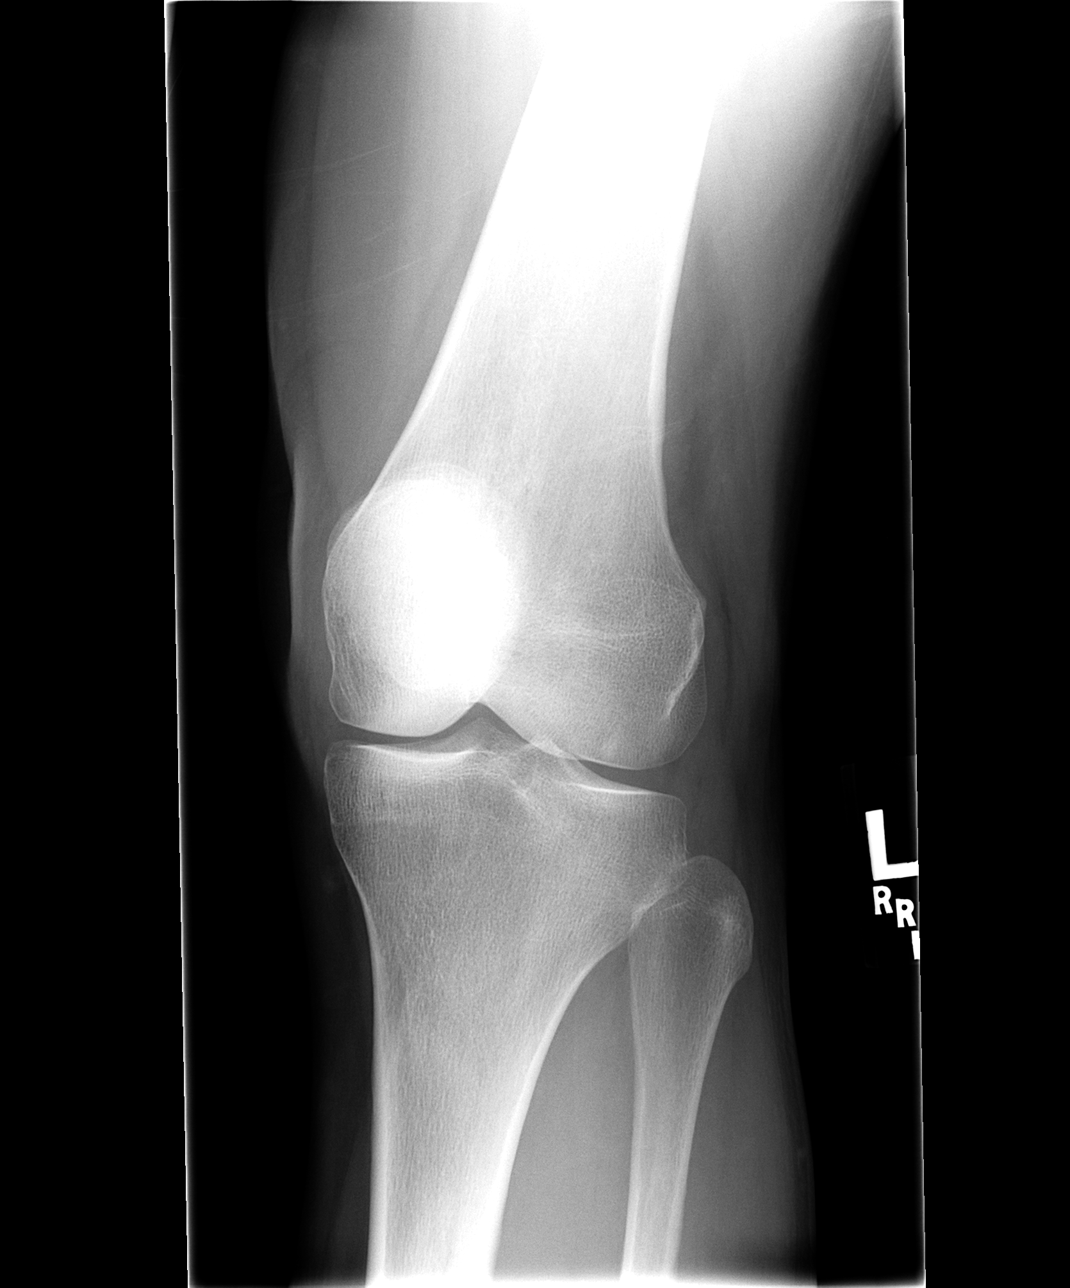

[view not recorded (3 of 4)]
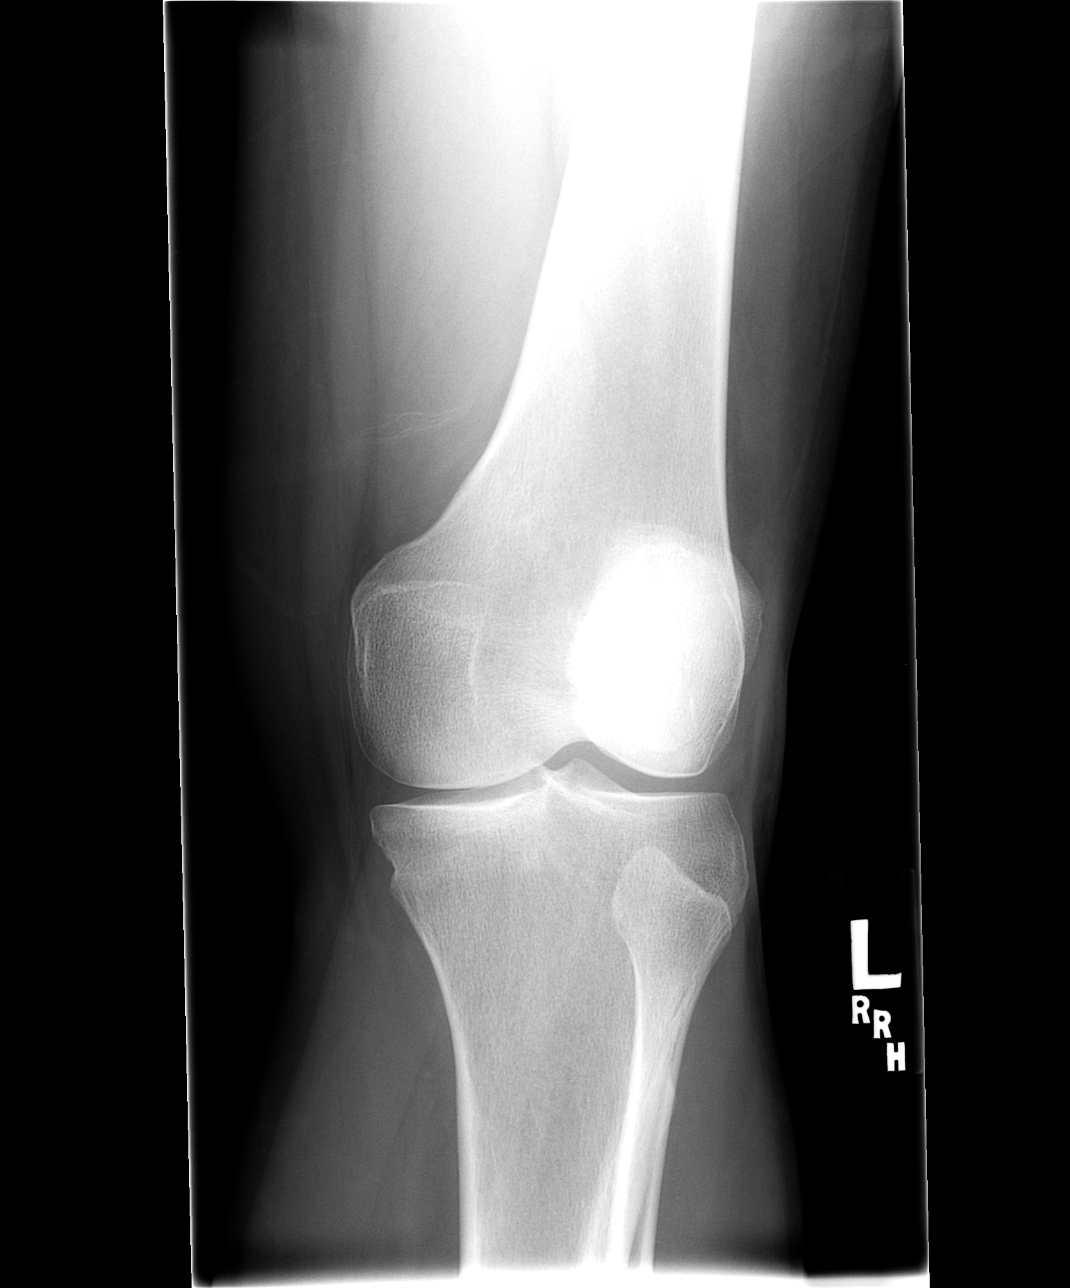

[view not recorded (4 of 4)]
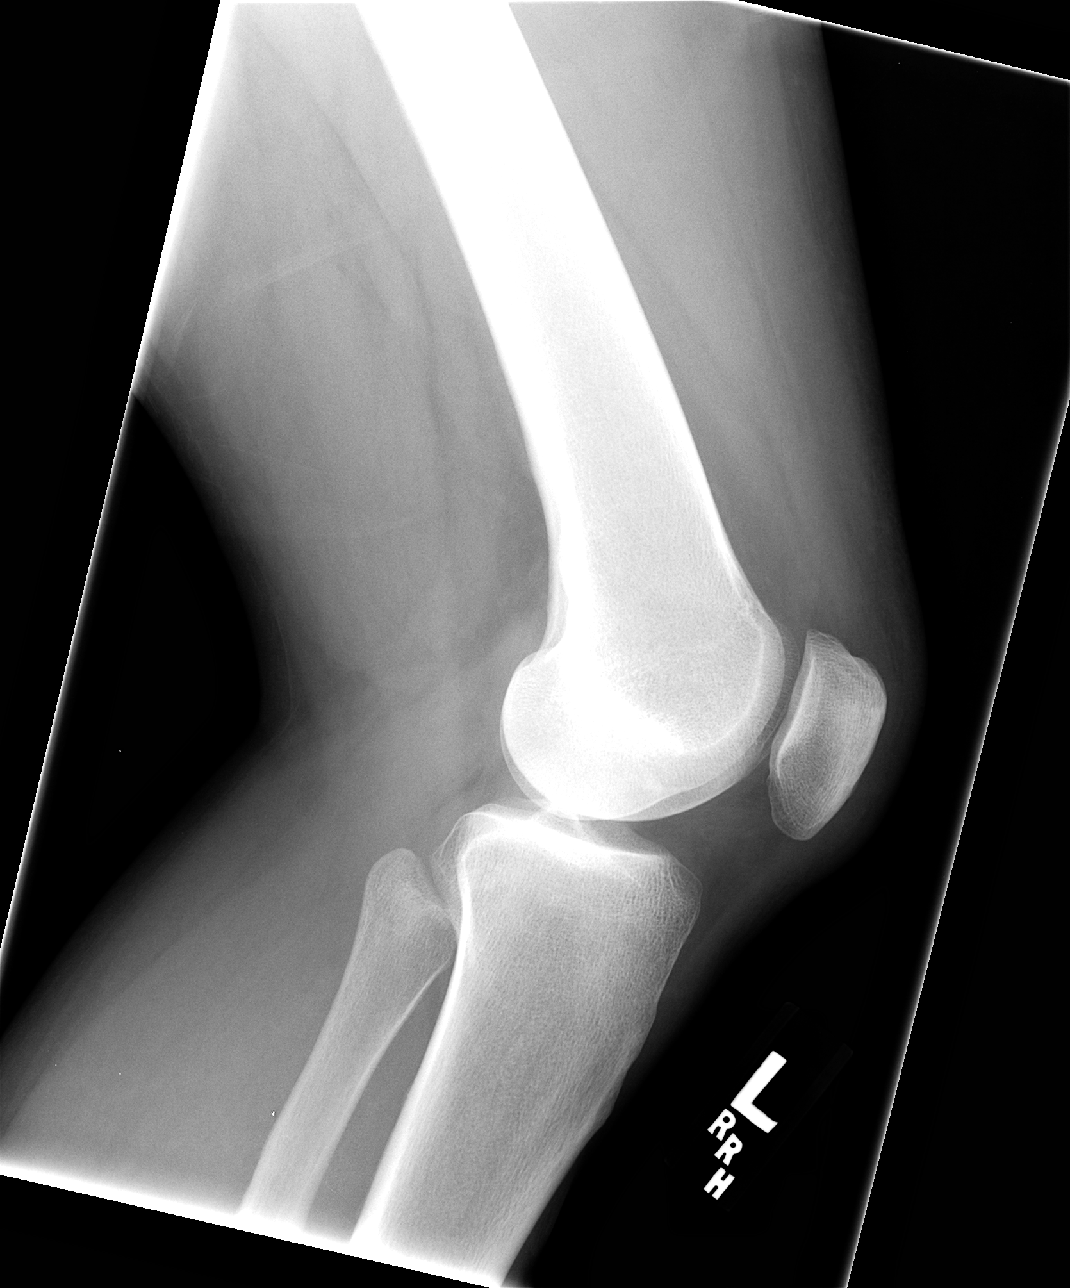

[4 of 4 positions shown; findings below may reference images not displayed]

FINDINGS: Anatomic alignment. No fracture.
**********************************
IMPRESSION
Negative left knee.
**********************************

## 2008-09-30 ENCOUNTER — Emergency Department (HOSPITAL_COMMUNITY): Admission: EM | Admit: 2008-09-30 | Discharge: 2008-09-30 | Payer: Self-pay | Admitting: Emergency Medicine

## 2008-10-28 ENCOUNTER — Emergency Department (HOSPITAL_COMMUNITY): Admission: EM | Admit: 2008-10-28 | Discharge: 2008-10-28 | Payer: Self-pay | Admitting: Emergency Medicine

## 2009-03-23 ENCOUNTER — Emergency Department (HOSPITAL_COMMUNITY): Admission: EM | Admit: 2009-03-23 | Discharge: 2009-03-23 | Payer: Self-pay | Admitting: Emergency Medicine

## 2009-03-28 ENCOUNTER — Emergency Department (HOSPITAL_COMMUNITY): Admission: EM | Admit: 2009-03-28 | Discharge: 2009-03-28 | Payer: Self-pay | Admitting: Emergency Medicine

## 2009-08-29 ENCOUNTER — Emergency Department (HOSPITAL_COMMUNITY): Admission: EM | Admit: 2009-08-29 | Discharge: 2009-08-29 | Payer: Self-pay | Admitting: Emergency Medicine

## 2009-10-23 ENCOUNTER — Emergency Department (HOSPITAL_COMMUNITY): Admission: EM | Admit: 2009-10-23 | Discharge: 2009-10-23 | Payer: Self-pay | Admitting: Emergency Medicine

## 2010-04-10 IMAGING — CR DG HIP (WITH OR WITHOUT PELVIS) 2-3V*L*
3 series · 3 of 3 positions shown · non-contrast
Comparison: None

CLINICAL DATA: Left hip pain

LEFT HIP - COMPLETE 2+ VIEW

[view not recorded (1 of 3)]
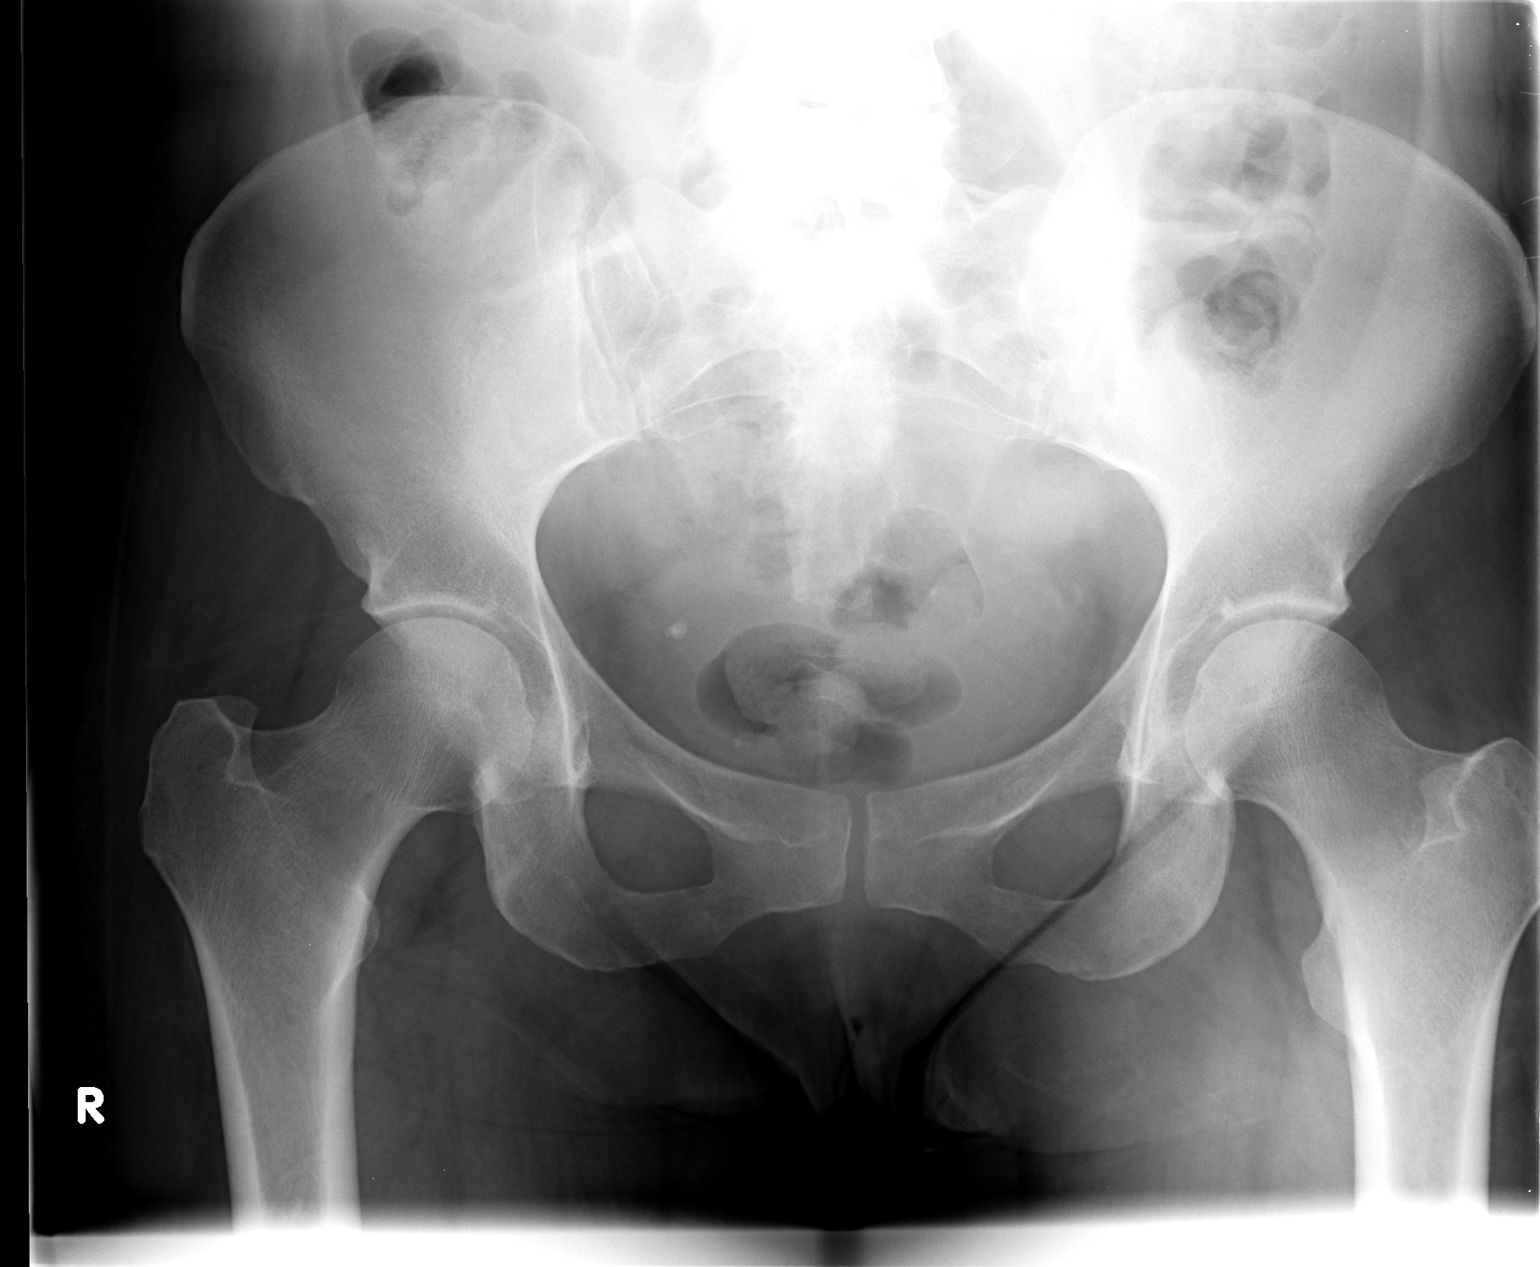

[view not recorded (2 of 3)]
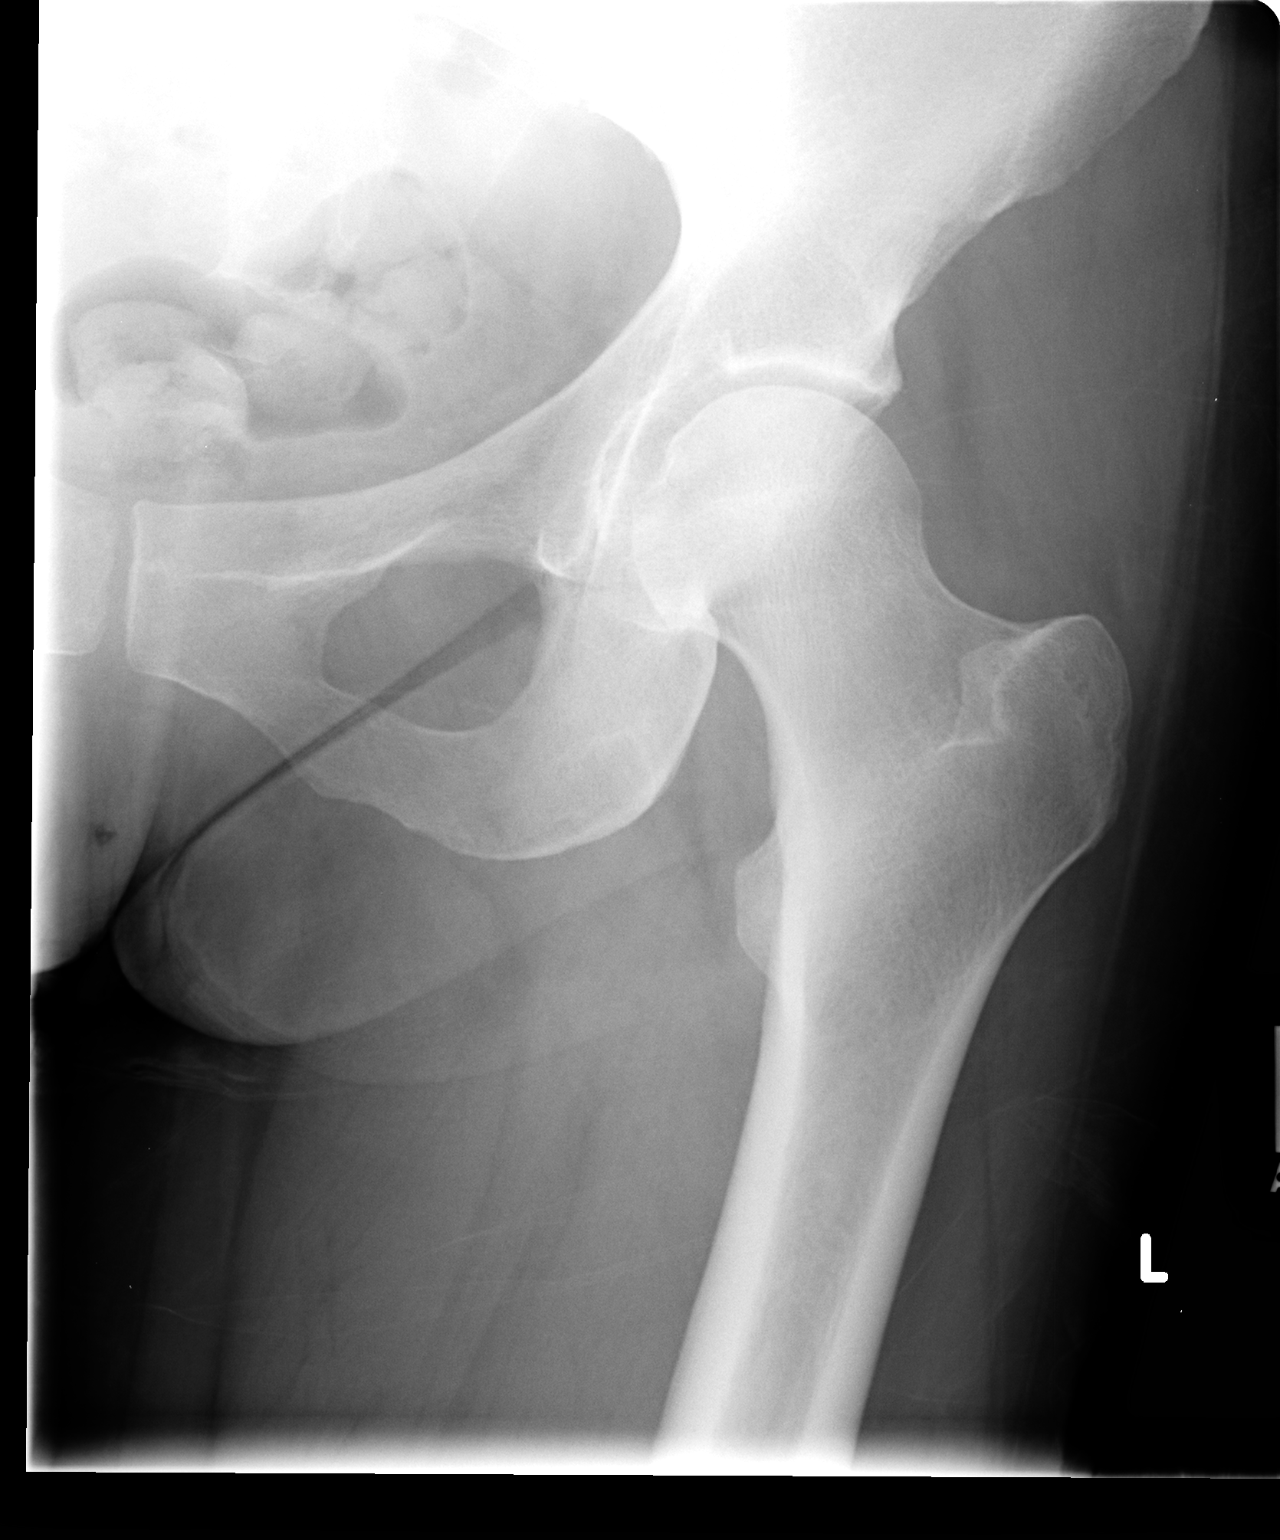

[view not recorded (3 of 3)]
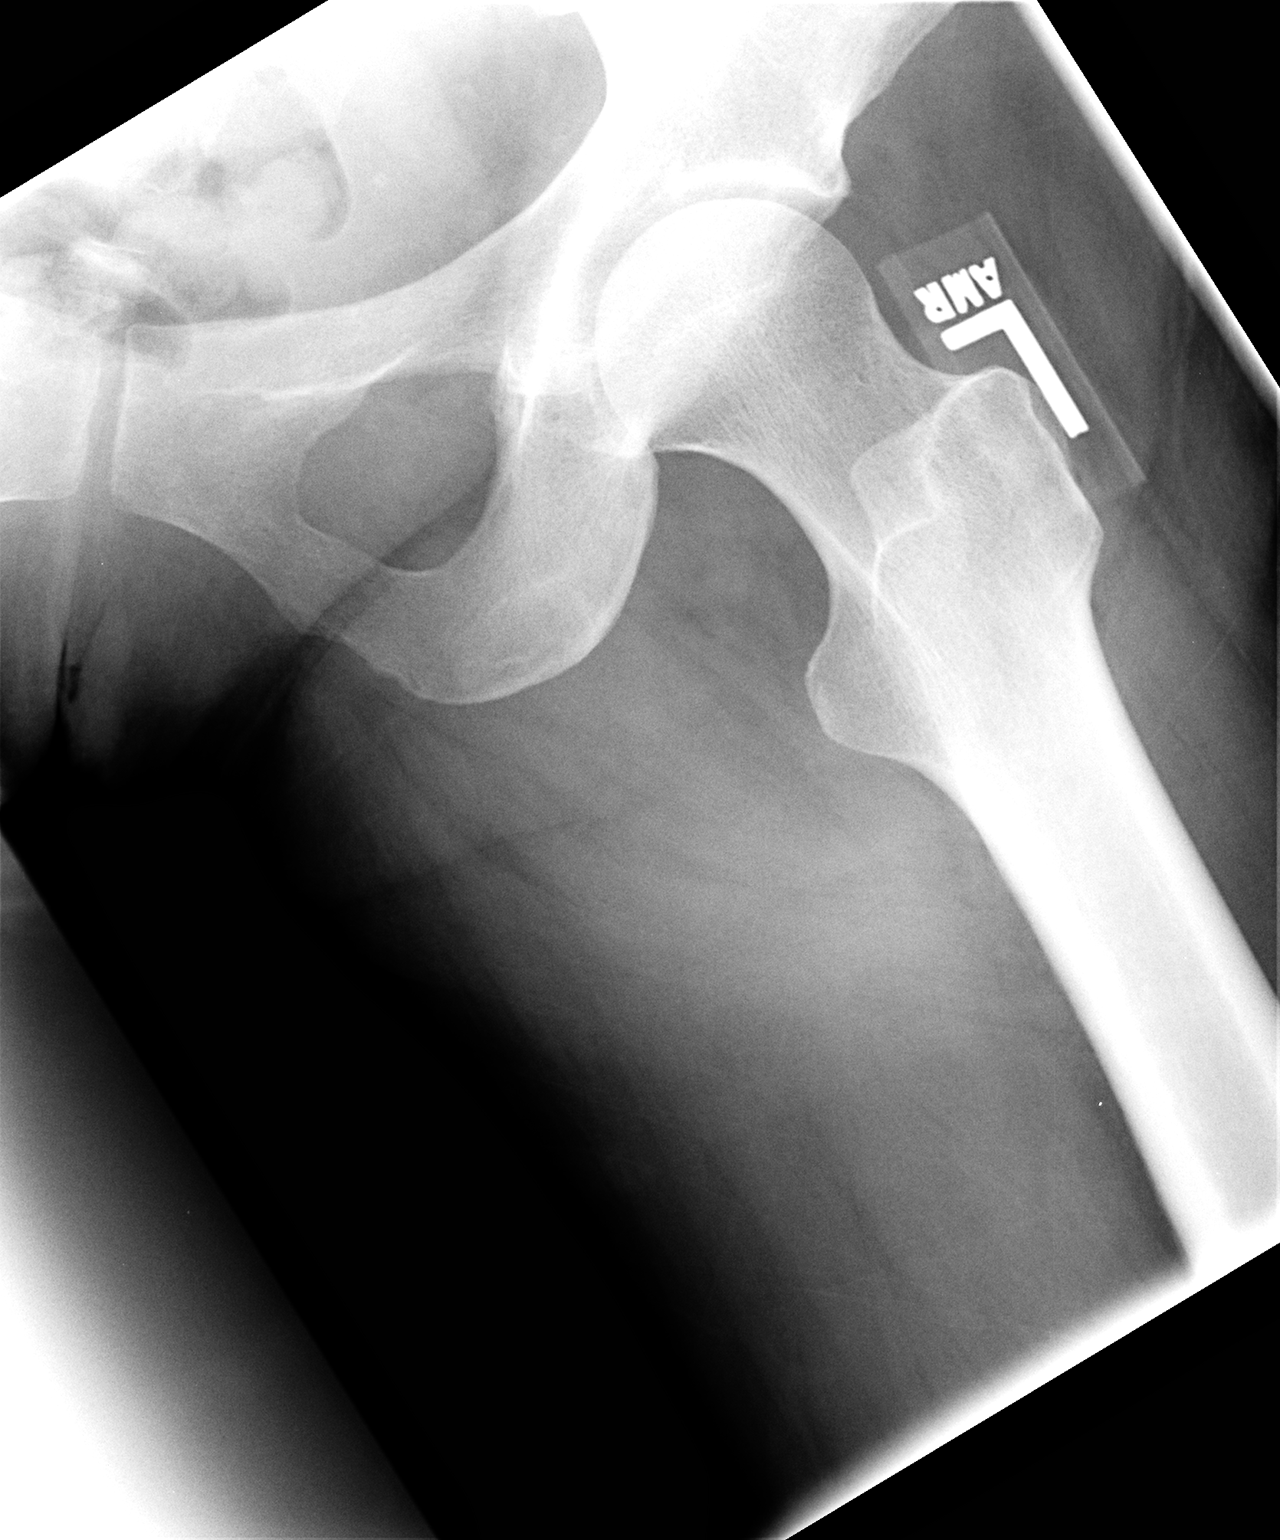

[3 of 3 positions shown; findings below may reference images not displayed]

FINDINGS: Bone mineralization preserved.
Joint spaces symmetric.
No acute fracture, dislocation, or bone destruction.
Bilateral pelvic phleboliths.
Bony pelvis unremarkable.
IMPRESSION: No acute abnormalities.

## 2010-05-05 ENCOUNTER — Emergency Department (HOSPITAL_COMMUNITY): Payer: Medicaid Other

## 2010-05-05 ENCOUNTER — Inpatient Hospital Stay (HOSPITAL_COMMUNITY)
Admission: EM | Admit: 2010-05-05 | Discharge: 2010-05-06 | DRG: 812 | Disposition: A | Discharge status: Home / Self Care | Payer: Medicaid Other | Attending: Obstetrics and Gynecology | Admitting: Obstetrics and Gynecology

## 2010-05-05 DIAGNOSIS — M069 Rheumatoid arthritis, unspecified: Secondary | ICD-10-CM | POA: Diagnosis present

## 2010-05-05 DIAGNOSIS — D500 Iron deficiency anemia secondary to blood loss (chronic): Principal | ICD-10-CM | POA: Diagnosis present

## 2010-05-05 DIAGNOSIS — N920 Excessive and frequent menstruation with regular cycle: Secondary | ICD-10-CM | POA: Diagnosis present

## 2010-05-05 LAB — URINALYSIS, ROUTINE W REFLEX MICROSCOPIC
Ketones, ur: NEGATIVE mg/dL
Leukocytes, UA: NEGATIVE
Nitrite: NEGATIVE
Urobilinogen, UA: 0.2 mg/dL (ref 0.0–1.0)
pH: 6 (ref 5.0–8.0)

## 2010-05-05 LAB — BASIC METABOLIC PANEL
BUN: 6 mg/dL (ref 6–23)
CO2: 25 mEq/L (ref 19–32)
Chloride: 107 mEq/L (ref 96–112)
Creatinine, Ser: 0.49 mg/dL (ref 0.4–1.2)

## 2010-05-05 LAB — CBC
Hemoglobin: 5.8 g/dL — CL (ref 12.0–15.0)
MCH: 17.1 pg — ABNORMAL LOW (ref 26.0–34.0)
MCV: 62.9 fL — ABNORMAL LOW (ref 78.0–100.0)
RBC: 3.4 MIL/uL — ABNORMAL LOW (ref 3.87–5.11)

## 2010-05-05 LAB — DIFFERENTIAL
Lymphocytes Relative: 22 % (ref 12–46)
Lymphs Abs: 1.1 10*3/uL (ref 0.7–4.0)
Monocytes Relative: 8 % (ref 3–12)
Neutro Abs: 3.4 10*3/uL (ref 1.7–7.7)
Neutrophils Relative %: 65 % (ref 43–77)

## 2010-05-05 LAB — POCT PREGNANCY, URINE: Preg Test, Ur: NEGATIVE

## 2010-05-05 LAB — URINE MICROSCOPIC-ADD ON

## 2010-05-05 LAB — ABO/RH: ABO/RH(D): O NEG

## 2010-05-06 LAB — DIFFERENTIAL
Eosinophils Absolute: 0.3 10*3/uL (ref 0.0–0.7)
Eosinophils Relative: 4 % (ref 0–5)
Lymphocytes Relative: 45 % (ref 12–46)
Lymphs Abs: 2.6 10*3/uL (ref 0.7–4.0)
Monocytes Absolute: 0.5 10*3/uL (ref 0.1–1.0)
Monocytes Relative: 9 % (ref 3–12)

## 2010-05-06 LAB — CROSSMATCH
Antibody Screen: NEGATIVE
Unit division: 0

## 2010-05-06 LAB — CBC
HCT: 23.5 % — ABNORMAL LOW (ref 36.0–46.0)
MCH: 19.5 pg — ABNORMAL LOW (ref 26.0–34.0)
MCHC: 29.4 g/dL — ABNORMAL LOW (ref 30.0–36.0)
MCV: 66.6 fL — ABNORMAL LOW (ref 78.0–100.0)
Platelets: 302 10*3/uL (ref 150–400)
RDW: 20.8 % — ABNORMAL HIGH (ref 11.5–15.5)

## 2010-05-27 ENCOUNTER — Other Ambulatory Visit: Payer: Self-pay | Admitting: Obstetrics and Gynecology

## 2010-05-27 ENCOUNTER — Other Ambulatory Visit (HOSPITAL_COMMUNITY)
Admission: RE | Admit: 2010-05-27 | Discharge: 2010-05-27 | Disposition: A | Discharge status: Home / Self Care | Payer: Medicaid Other | Source: Ambulatory Visit | Attending: Obstetrics and Gynecology | Admitting: Obstetrics and Gynecology

## 2010-05-27 DIAGNOSIS — Z01419 Encounter for gynecological examination (general) (routine) without abnormal findings: Secondary | ICD-10-CM | POA: Insufficient documentation

## 2010-05-27 DIAGNOSIS — Z113 Encounter for screening for infections with a predominantly sexual mode of transmission: Secondary | ICD-10-CM | POA: Insufficient documentation

## 2010-05-30 ENCOUNTER — Other Ambulatory Visit: Payer: Self-pay | Admitting: Obstetrics and Gynecology

## 2010-05-30 ENCOUNTER — Encounter (HOSPITAL_COMMUNITY): Payer: Medicaid Other

## 2010-05-30 LAB — BASIC METABOLIC PANEL
BUN: 9 mg/dL (ref 6–23)
Creatinine, Ser: 0.59 mg/dL (ref 0.4–1.2)
GFR calc non Af Amer: >60 mL/min (ref >60)
Glucose, Bld: 86 mg/dL (ref 70–99)

## 2010-05-30 LAB — URINALYSIS, ROUTINE W REFLEX MICROSCOPIC
Bilirubin Urine: NEGATIVE
Ketones, ur: NEGATIVE mg/dL
Nitrite: NEGATIVE
Specific Gravity, Urine: 1.02 (ref 1.005–1.030)
Urobilinogen, UA: 0.2 mg/dL (ref 0.0–1.0)

## 2010-05-30 LAB — URINE MICROSCOPIC-ADD ON

## 2010-05-30 LAB — SURGICAL PCR SCREEN
MRSA, PCR: NEGATIVE
Staphylococcus aureus: NEGATIVE

## 2010-05-30 LAB — CBC
HCT: 32.2 % — ABNORMAL LOW (ref 36.0–46.0)
Hemoglobin: 9.7 g/dL — ABNORMAL LOW (ref 12.0–15.0)
MCHC: 30.1 g/dL (ref 30.0–36.0)
Platelets: 482 10*3/uL — ABNORMAL HIGH (ref 150–400)
WBC: 7.2 10*3/uL (ref 4.0–10.5)

## 2010-05-30 LAB — HCG, QUANTITATIVE, PREGNANCY: hCG, Beta Chain, Quant, S: 1 m[IU]/mL (ref <5)

## 2010-05-30 NOTE — Discharge Summary (Signed)
[facility]  Patient:    [name], [name]                        MRN: [id number] Adm. Date:  [id number] Disc. Date: [id number] Attending:  Lucky Cowboy CC:         Christin Bach, M.D., [place], [place]             Cherly Anderson, D.D.S.             [facility]                           Discharge Summary  DISCHARGE DIAGNOSIS:          Right neck abscess from odontogenic source.  PROCEDURES PERFORMED:         Incision and drainage of right neck abscess, and removal of abscess tooth #31.  HOSPITAL COURSE:              The patient was admitted on March 05, 1999 after progressive swelling in the right neck.  CT scan revealed a 5 cm submandibular abscess.  Patient was admitted to [facility], as she was [redacted] weeks pregnant.  She was taken emergently to the operating room and with close fetal monitoring, incision and drainage of the right submandibular abscess was performed without complication.  A significant amount of purulence was relieved.  Cultures were obtained.  The final culture revealed abundant clostridium Hastiforme.  Patient had drains left in place.  She was observed closely in the intensive care unit.  She was followed by the [facility] teaching service.  She was transferred to a regular room on postoperative day #2.  She was treated with IV Unasyn throughout her hospital stay.  On postoperative day #2, an oral surgery consultation was obtained.  This revealed abscess tooth #31 as being the source of the neck infection.  For this reason, Dr. Retta Mac returned her to the operating suite on March 07, 1999 for removal of tooth #31.  Patients neck continued to improve throughout her hospitalization. Drains were removed on postoperative day #3 and the patient was discharged to home the following morning.  DISCHARGE MEDICATIONS:        She was discharged on Percocet and Augmentin.  DISCHARGE DISPOSITION:        To home.  WOUND CARE:                    She is to keep the wound dry and apply hydrogen peroxide and Bacitracin ointment 2-3 times per day.  FOLLOW-UP:                    She was given an appointment at the [facility] office on March 17, 1999 at 1 p.m. DD:  05/08/99 TD:  05/10/99 Job: [id number]

## 2010-05-30 NOTE — Op Note (Signed)
[facility]  Patient:    [name], [name]                        MRN: [id number] Proc. Date: 03/07/99 Adm. Date:  [id number] Disc. Date: [id number] Attending:  Lucky Cowboy CC:         Cherly Anderson, D.D.S.                           Operative Report  PREOPERATIVE DIAGNOSIS:  Abscessed tooth #31.  POSTOPERATIVE DIAGNOSIS:  Abscessed tooth #31.  PROCEDURE:                    Surgical extraction of tooth #31.  SURGEON:                      Cherly Anderson, D.D.S.  ANESTHESIA:                   General anesthesia.  ESTIMATED BLOOD LOSS:         Minimal.  INDICATIONS:                  I was consulted by the OB/GYN service regarding persistent pain from the right posterior mandible, following drainage of a cervical swelling.  The patient complained of persistent tooth pain which was also sensitive to temperature changes.  She had difficulty sleeping, and therefore it is determined that to provide her with an adequate level of comfort for her final our weeks prior to delivery, the tooth would be extracted under general anesthesia n the operating room at [facility].  DESCRIPTION OF PROCEDURE:     The patient was brought to the operating room and  placed in a supine position.  Once general anesthesia was induced via nasal tracheal intubation, the patient was prepped and draped for a maxillofacial procedure of this type.  Initially 2 cc of 2% Lidocaine with 1:100,000 epinephrine were injected in the right posterior mandible.  Next, a small gingival flap was  reflected.  Tooth #31 was manipulated and extracted without complications.  The  gingiva was sutured over the socket utilizing #3-0 chromic suture.  Gauze was placed over the extraction site.  This essentially concluded the procedure.  The patient was allowed to awaken in the operating room and was extubated without complications.  She was transferred to the post anesthesia care unit in  stable nd satisfactory condition.  From an oral maxillofacial surgery standpoint, the patient is ready to be discharged to home, and she will follow up in my office on a p.r.n. basis. DD:  03/07/99 TD:  03/09/99 Job: [id number]

## 2010-06-01 NOTE — H&P (Signed)
  [name], [name] NO.:  [id number]  MEDICAL RECORD NO.:  [id number]           PATIENT TYPE:  LOCATION:                                 FACILITY:  PHYSICIAN:  Tilda Burrow, M.D. DATE OF BIRTH:  13-Oct-1965  DATE OF ADMISSION: DATE OF DISCHARGE:  LH                             HISTORY & PHYSICAL   Severe menorrhagia with associated anemia.  HISTORY OF PRESENT ILLNESS:  This is a 45 year old female status post tubal ligation in 2001, multipara with a recent hospitalization for severe anemia, requiring transfusion.  She has a heavy menses and chronic anemia, is admitted for hysteroscopy, D and C, endometrial ablation.  She has been seen in our office in follow-up with hemoglobin improved to 9.2 on May 27, 2010, endometrial biopsy has been performed to confirm benign tissue.  She tolerated this quite well.  Plans are to proceed with hysteroscopy, D and C, and endometrial ablation to address the heavy menses.  The patient has had medical review of the procedure with failure rate of 10% quoted to the patient with technical aspects of the procedure reviewed with brochures.  The patient expresses adequate understanding to proceed.  PAST MEDICAL HISTORY:  Positive for attention deficit disorder and chronic anemia.  MEDICATIONS:  No medications at present.  SURGICAL HISTORY:  Childhood wrist surgery, D and C in 1987, oral surgery in 1995, abscess tooth requiring a percutaneous drain through the outer jaw, tubal sterilization in 2001 by Falope rings did by Emelda Fear.  ALLERGIES:  CODEINE causes minor reaction of itching.  FAMILY HISTORY:  Positive for hypertension, diabetes and breast cancer in a maternal cousin at age 27.  PHYSICAL EXAMINATION:  GENERAL:  Shows a healthy Caucasian female, alert and oriented x3. EYES:  Pupils are equal, round, reactive.  Extraocular movements intact. NECK:  Supple. CHEST:  Clear to auscultation. ABDOMEN:  Slim without  masses. EXTERNAL GENITALIA:  Multiparous.  Pap smear recently collected and pending.  Uterus, anteflexed, upper limits normal size, sounding to 10 cm on endometrial biopsy.  Adnexa without masses.  IMPRESSION: 1. Menorrhagia with anemia. 2. Status post permanent sterilization. 3. Status post transfusions x2 units packs cells due to recent severe     menorrhagia.  PLAN:  Hysteroscopy, D and C, endometrial ablation, Jun 04, 2010.     Tilda Burrow, M.D.     JVF/MEDQ  D:  05/29/2010  T:  05/29/2010  Job:  [id number]  cc:   [facility]  Electronically Signed by Christin Bach M.D. on 06/01/2010 95:62:13 PM

## 2010-06-04 ENCOUNTER — Ambulatory Visit (HOSPITAL_COMMUNITY)
Admission: RE | Admit: 2010-06-04 | Discharge: 2010-06-04 | Disposition: A | Discharge status: Home / Self Care | Payer: Medicaid Other | Source: Ambulatory Visit | Attending: Obstetrics and Gynecology | Admitting: Obstetrics and Gynecology

## 2010-06-04 DIAGNOSIS — L909 Atrophic disorder of skin, unspecified: Secondary | ICD-10-CM | POA: Insufficient documentation

## 2010-06-04 DIAGNOSIS — D509 Iron deficiency anemia, unspecified: Secondary | ICD-10-CM | POA: Insufficient documentation

## 2010-06-04 DIAGNOSIS — L919 Hypertrophic disorder of the skin, unspecified: Secondary | ICD-10-CM | POA: Insufficient documentation

## 2010-06-04 DIAGNOSIS — N920 Excessive and frequent menstruation with regular cycle: Secondary | ICD-10-CM | POA: Insufficient documentation

## 2010-06-07 NOTE — Op Note (Signed)
  NAMEDESTINE, ZIRKLE NO.:  [id number]  MEDICAL RECORD NO.:  [id number]           PATIENT TYPE:  O  LOCATION:  [facility]                          FACILITY:  [facility]  PHYSICIAN:  Tilda Burrow, M.D. DATE OF BIRTH:  [birth year]  DATE OF PROCEDURE:  06/04/2010 DATE OF DISCHARGE:                              OPERATIVE REPORT   PREOPERATIVE DIAGNOSES:  Menorrhagia with anemia and upper lip skin tag.  POSTOPERATIVE DIAGNOSES:  Menorrhagia with anemia and upper lip skin tag.  PROCEDURES: 1. Hysteroscopy,  dilatation and curettage, endometrial ablation. 2. Excision of upper lip skin tag.  SURGEON:  Tilda Burrow, MD  ASSISTANT:  None.  ANESTHESIA:  General.  COMPLICATIONS:  General with LMA.  COMPLICATIONS:  None.  FINDINGS:  Uterus sounding to 10 cm preprocedure.  Smooth endometrial cavity with recently negative biopsy in the office.  DETAILS OF PROCEDURE:  The patient was taken to the operating room, prepped and draped, time-out conducted and confirmed by involved parties with procedure to be performed first.  This consisted of prepping the upper lip after time-out, with local anesthesia 1 mL of Marcaine with epinephrine injected beneath the central upper lip skin tag.  This was then trimmed crushing the base of the skin tag and then transecting across it in a vertical fashion.  Silver nitrate was attempted over the spot to address hemostasis, very lightly then a Steri-Strip placed across it for tissue edge control.  The patient tolerated the procedure well with minimal blood loss.  Hysteroscopy, D&C, endometrial ablation was then performed by repositioning, grasping the cervix with single-tooth tenaculum through weighted speculum, sounding the uterus to 10 cm dilating to 23-French, allowing introduction of a rigid 30-degree operative hysteroscope with identification of tubal ostia and no evidence of endometrial cavity pathology.  Curettage was briefly  performed obtaining minimal tissue as expected.  Gynecare Thermachoice III endometrial ablation device was then activated, inserted, and insufflated with size 20 mL of D5W, and the 8-minute thermal ablation sequence completed.  All 28 mL of fluid was recovered.  Paracervical block using 29 mL of Marcaine with epinephrine at 3, 5, 7, and 9 o'clock was then performed and the patient allowed to go to the recovery room in good condition.  Sponge and needle counts correct.     Tilda Burrow, M.D.     JVF/MEDQ  D:  06/04/2010  T:  06/04/2010  Job:  [id number]  cc:   [facility]  Electronically Signed by Christin Bach M.D. on 06/07/2010 11:26:36 PM

## 2010-06-26 ENCOUNTER — Other Ambulatory Visit (HOSPITAL_COMMUNITY): Payer: Self-pay | Admitting: Family Medicine

## 2010-06-26 DIAGNOSIS — Z139 Encounter for screening, unspecified: Secondary | ICD-10-CM

## 2010-07-03 ENCOUNTER — Emergency Department (HOSPITAL_COMMUNITY): Payer: Medicaid Other

## 2010-07-03 ENCOUNTER — Emergency Department (HOSPITAL_COMMUNITY)
Admission: EM | Admit: 2010-07-03 | Discharge: 2010-07-03 | Disposition: A | Discharge status: Home / Self Care | Payer: Medicaid Other | Attending: Emergency Medicine | Admitting: Emergency Medicine

## 2010-07-03 DIAGNOSIS — S1093XA Contusion of unspecified part of neck, initial encounter: Secondary | ICD-10-CM | POA: Insufficient documentation

## 2010-07-03 DIAGNOSIS — M069 Rheumatoid arthritis, unspecified: Secondary | ICD-10-CM | POA: Insufficient documentation

## 2010-07-03 DIAGNOSIS — S0003XA Contusion of scalp, initial encounter: Secondary | ICD-10-CM | POA: Insufficient documentation

## 2010-07-03 DIAGNOSIS — G8929 Other chronic pain: Secondary | ICD-10-CM | POA: Insufficient documentation

## 2010-07-03 DIAGNOSIS — Z862 Personal history of diseases of the blood and blood-forming organs and certain disorders involving the immune mechanism: Secondary | ICD-10-CM | POA: Insufficient documentation

## 2010-07-07 ENCOUNTER — Ambulatory Visit (HOSPITAL_COMMUNITY)
Admission: RE | Admit: 2010-07-07 | Discharge: 2010-07-07 | Disposition: A | Discharge status: Home / Self Care | Payer: Medicaid Other | Source: Ambulatory Visit | Attending: Family Medicine | Admitting: Family Medicine

## 2010-07-07 DIAGNOSIS — Z139 Encounter for screening, unspecified: Secondary | ICD-10-CM

## 2010-07-07 DIAGNOSIS — Z1231 Encounter for screening mammogram for malignant neoplasm of breast: Secondary | ICD-10-CM | POA: Insufficient documentation

## 2010-07-23 NOTE — H&P (Signed)
  NAMEAUNDRA, PUNG NO.:  [id number]  MEDICAL RECORD NO.:  [id number]  LOCATION:                                 FACILITY:  PHYSICIAN:  Tilda Burrow, M.D. DATE OF BIRTH:  05/20/1965  DATE OF ADMISSION: DATE OF DISCHARGE:  LH                             HISTORY & PHYSICAL   ADMISSION DIAGNOSIS:  Menorrhagia, severe anemia.  HISTORY OF PRESENT ILLNESS:  This 45 year old female who presents to the emergency room with a chief complaint of vaginal bleeding.  She has had a progressive history of bright heavy periods that has been documented with anemia in the past.  She is having crampy vaginal bleeding and pain.  Pain rated as 3/10 with moderate discomfort.  She has a history of anemia.  PRIMARY CARE PHYSICIAN:  Melvyn Novas, MD as well as Monica Dubin, MD for arthritis.  FAMILY MEDICAL HISTORY:  Also, notable for chronic pain and anemia.  FAMILY HISTORY:  Benign.  She has a daughter who works at [employer].  SURGICAL HISTORY:  Tubal ligation and dental operation, childhood wrist surgery, D and C in 1987, oral surgery in 1995, abscess teeth requiring percutaneous drain through the outer jaw in the 1990s, tubal sterilization in 2001 by Falope ring application, [facility] physician. She has had no tetanus in those 5 years and barriers to learning.  LABORATORY DATA:  She is admitted with a potassium of 3.6, blood type confirmed as O negative, BUN 6, creatinine 0.49.  Urinalysis negative. Hemoglobin is documented at 5.4, 5.8, hematocrit 21,000, white count 5100.  PHYSICAL EXAMINATION:  GENERAL:  Healthy-appearing Caucasian female, weight approximately 140. EYES:  Pupils equal, round and reactive. NECK:  Supple. CHEST:  Clear to auscultation. ABDOMEN:  Nontender. EXTERNAL GENITALIA:  Normal per emergency room physician.  Uterus nonpurulent.  Cervical exam, uterus seems upper limits normal size. There is a transvaginal  ultrasound which shows uterus 9.1 x 5.3 x 6.4-cm with questionable heterogeneous myometrium, poorly defined endometrium 13 mL in thickness with left and right ovaries normal.  Reason the question of adenomyosis, but otherwise normal.  ASSESSMENT:  Menorrhagia and anemia.  PLAN:  Continue with plans initiated in the emergency room to transfuse 2 units of packed cells and use Megace and a Lysteda (tranexamic acid) to control menses with steady progress toward discharge.     Tilda Burrow, M.D.     JVF/MEDQ  D:  07/13/2010  T:  07/14/2010  Job:  [id number]  Electronically Signed by Christin Bach M.D. on 07/23/2010 02:38:02 PM

## 2010-09-19 ENCOUNTER — Encounter: Payer: Self-pay | Admitting: *Deleted

## 2010-09-19 ENCOUNTER — Emergency Department (HOSPITAL_COMMUNITY)
Admission: EM | Admit: 2010-09-19 | Discharge: 2010-09-19 | Disposition: A | Discharge status: Home / Self Care | Payer: Medicaid Other | Attending: Emergency Medicine | Admitting: Emergency Medicine

## 2010-09-19 ENCOUNTER — Emergency Department (HOSPITAL_COMMUNITY): Payer: Medicaid Other

## 2010-09-19 DIAGNOSIS — A599 Trichomoniasis, unspecified: Secondary | ICD-10-CM | POA: Insufficient documentation

## 2010-09-19 DIAGNOSIS — D649 Anemia, unspecified: Secondary | ICD-10-CM | POA: Insufficient documentation

## 2010-09-19 DIAGNOSIS — M069 Rheumatoid arthritis, unspecified: Secondary | ICD-10-CM | POA: Insufficient documentation

## 2010-09-19 DIAGNOSIS — F17200 Nicotine dependence, unspecified, uncomplicated: Secondary | ICD-10-CM | POA: Insufficient documentation

## 2010-09-19 DIAGNOSIS — R1084 Generalized abdominal pain: Secondary | ICD-10-CM

## 2010-09-19 HISTORY — DX: Anemia, unspecified: D64.9

## 2010-09-19 HISTORY — DX: Rheumatoid arthritis, unspecified: M06.9

## 2010-09-19 LAB — WET PREP, GENITAL: Yeast Wet Prep HPF POC: NONE SEEN

## 2010-09-19 LAB — URINALYSIS, ROUTINE W REFLEX MICROSCOPIC
Bilirubin Urine: NEGATIVE
Nitrite: NEGATIVE
Specific Gravity, Urine: 1.015 (ref 1.005–1.030)
Urobilinogen, UA: 0.2 mg/dL (ref 0.0–1.0)
pH: 5 (ref 5.0–8.0)

## 2010-09-19 LAB — POCT PREGNANCY, URINE: Preg Test, Ur: NEGATIVE

## 2010-09-19 LAB — URINE MICROSCOPIC-ADD ON

## 2010-09-19 MED ORDER — METRONIDAZOLE 500 MG PO TABS
2000.0000 mg | ORAL_TABLET | Freq: Once | ORAL | Status: AC
  Administered 2010-09-19: 2000 mg via ORAL
  Filled 2010-09-19: qty 4

## 2010-09-19 MED ORDER — HYDROMORPHONE HCL 1 MG/ML IJ SOLN
1.0000 mg | Freq: Once | INTRAMUSCULAR | Status: AC
  Administered 2010-09-19: 1 mg via INTRAMUSCULAR
  Filled 2010-09-19: qty 1

## 2010-09-19 MED ORDER — OXYCODONE-ACETAMINOPHEN 5-325 MG PO TABS
1.0000 | ORAL_TABLET | Freq: Once | ORAL | Status: AC
  Administered 2010-09-19: 1 via ORAL
  Filled 2010-09-19: qty 1

## 2010-09-19 MED ORDER — OXYCODONE-ACETAMINOPHEN 5-325 MG PO TABS: 1.0000 | ORAL_TABLET | Freq: Four times a day (QID) | ORAL | Status: AC | PRN

## 2010-09-19 MED ORDER — ONDANSETRON 8 MG PO TBDP
8.0000 mg | ORAL_TABLET | Freq: Once | ORAL | Status: AC
  Administered 2010-09-19: 8 mg via ORAL
  Filled 2010-09-19: qty 1

## 2010-09-19 NOTE — ED Notes (Signed)
Called pt x 1 with no answer at 16:31

## 2010-09-19 NOTE — ED Notes (Signed)
Pt c/o pain in her LLQ abdomen, nausea and vomiting. Pt alert and oriented x 3. Skin warm and dry. Color pink. Breath sounds clear and equal bilaterally. Abdomen soft. Pt c/o tenderness LLQ and states that sometimes it radiates to her back. No active vomiting at this time.

## 2010-09-19 NOTE — ED Provider Notes (Signed)
History     CSN: [id number] Arrival date & time: 09/19/2010  4:34 PM  Chief Complaint  Patient presents with  . Abdominal Pain   Patient is a 45 y.o. female presenting with abdominal pain. The history is provided by the patient.  Abdominal Pain The primary symptoms of the illness include abdominal pain, nausea and vomiting. The primary symptoms of the illness do not include fever, diarrhea, dysuria, vaginal discharge or vaginal bleeding. The current episode started yesterday. The problem has been gradually worsening.  Additional symptoms associated with the illness include frequency. Symptoms associated with the illness do not include constipation.   Pt reports onset of left flank/abd pain yesterday, also reprots it "goes to my rectum at times" but is having normal BM without pain Never had this pain  Past Medical History  Diagnosis Date  . Rheumatoid arthritis   . Chronic anemia     Past Surgical History  Procedure Date  . Tubal ligation   . Endometrial ablation     No family history on file.  History  Substance Use Topics  . Smoking status: Current Everyday Smoker -- 1.0 packs/day    Types: Cigarettes  . Smokeless tobacco: Not on file  . Alcohol Use: No    OB History    Grav Para Term Preterm Abortions TAB SAB Ect Mult Living                  Review of Systems  Constitutional: Negative for fever.  Gastrointestinal: Positive for nausea, vomiting and abdominal pain. Negative for diarrhea and constipation.  Genitourinary: Positive for frequency. Negative for dysuria, vaginal bleeding and vaginal discharge.  All other systems reviewed and are negative.    Physical Exam  BP 119/89  Pulse 81  Temp(Src) 98.4 F (36.9 C) (Oral)  Resp 17  Ht 5\' 6"  (1.676 m)  Wt 150 lb (68.04 kg)  BMI 24.21 kg/m2  SpO2 100%  Physical Exam  CONSTITUTIONAL: Well developed/well nourished HEAD AND FACE: Normocephalic/atraumatic EYES: EOMI/PERRL ENMT: Mucous membranes  moist NECK: supple no meningeal signs SPINE:entire spine nontender CV: S1/S2 noted, no murmurs/rubs/gallops noted LUNGS: Lungs are clear to auscultation bilaterally, no apparent distress ABDOMEN: soft, LLQ  Tenderness, no rebound or guarding is noted WU:JWJX cva tenderness Pelvic - no cmt, no adnexal tenderness/mass.  No vag bleeding/discharge.  Chaperone present NEURO: Pt is awake/alert, moves all extremitiesx4 EXTREMITIES: pulses normal, full ROM SKIN: warm, color normal PSYCH: no abnormalities of mood noted   ED Course  Procedures  MDM Nursing notes reviewed and considered in documentation All labs/vitals reviewed and considered Previous records reviewed and considered   7:09 PM Pt with continued left flank/abd pain, given history, ureteral stone possibility Will get ct imaging  8:24 PM Pt improved Wants to go home Ct shows no acute findings Told about trich, she admits to sexual encounter several months ago with new partner, none since Will f/u on gc/chlam studies  Joya Gaskins, MD 09/19/10 2024

## 2010-09-19 NOTE — ED Notes (Addendum)
C/o onset of sever left-sided abd pain this AM; states pain sharp and radiates to rectum; c/o n/v with pain

## 2010-09-19 NOTE — ED Notes (Signed)
EDP in with pt to do pelvic exam

## 2010-10-20 ENCOUNTER — Emergency Department (HOSPITAL_COMMUNITY): Payer: Medicaid Other

## 2010-10-20 ENCOUNTER — Emergency Department (HOSPITAL_COMMUNITY)
Admission: EM | Admit: 2010-10-20 | Discharge: 2010-10-20 | Disposition: A | Discharge status: Home / Self Care | Payer: Medicaid Other | Attending: Emergency Medicine | Admitting: Emergency Medicine

## 2010-10-20 ENCOUNTER — Encounter (HOSPITAL_COMMUNITY): Payer: Self-pay | Admitting: Emergency Medicine

## 2010-10-20 DIAGNOSIS — M2550 Pain in unspecified joint: Secondary | ICD-10-CM | POA: Insufficient documentation

## 2010-10-20 DIAGNOSIS — F17200 Nicotine dependence, unspecified, uncomplicated: Secondary | ICD-10-CM | POA: Insufficient documentation

## 2010-10-20 DIAGNOSIS — S63509A Unspecified sprain of unspecified wrist, initial encounter: Secondary | ICD-10-CM | POA: Insufficient documentation

## 2010-10-20 DIAGNOSIS — X58XXXA Exposure to other specified factors, initial encounter: Secondary | ICD-10-CM | POA: Insufficient documentation

## 2010-10-20 DIAGNOSIS — IMO0001 Reserved for inherently not codable concepts without codable children: Secondary | ICD-10-CM | POA: Insufficient documentation

## 2010-10-20 DIAGNOSIS — M79609 Pain in unspecified limb: Secondary | ICD-10-CM | POA: Insufficient documentation

## 2010-10-20 DIAGNOSIS — Z79899 Other long term (current) drug therapy: Secondary | ICD-10-CM | POA: Insufficient documentation

## 2010-10-20 DIAGNOSIS — S300XXA Contusion of lower back and pelvis, initial encounter: Secondary | ICD-10-CM | POA: Insufficient documentation

## 2010-10-20 MED ORDER — IBUPROFEN 800 MG PO TABS
800.0000 mg | ORAL_TABLET | Freq: Once | ORAL | Status: AC
  Administered 2010-10-20: 800 mg via ORAL
  Filled 2010-10-20: qty 1

## 2010-10-20 MED ORDER — MORPHINE SULFATE 4 MG/ML IJ SOLN
4.0000 mg | Freq: Once | INTRAMUSCULAR | Status: AC
  Administered 2010-10-20: 4 mg via INTRAMUSCULAR
  Filled 2010-10-20: qty 1

## 2010-10-20 MED ORDER — METHOCARBAMOL 500 MG PO TABS: ORAL_TABLET | ORAL | Status: DC

## 2010-10-20 MED ORDER — HYDROCODONE-ACETAMINOPHEN 7.5-325 MG PO TABS: 1.0000 | ORAL_TABLET | ORAL | Status: AC | PRN

## 2010-10-20 MED ORDER — PROMETHAZINE HCL 12.5 MG PO TABS
12.5000 mg | ORAL_TABLET | Freq: Once | ORAL | Status: AC
  Administered 2010-10-20: 12.5 mg via ORAL
  Filled 2010-10-20: qty 1

## 2010-10-20 NOTE — ED Provider Notes (Signed)
Medical screening examination/treatment/procedure(s) were performed by non-physician practitioner and as supervising physician I was immediately available for consultation/collaboration.   Lakin Rhine L Yazleen Molock, MD 10/20/10 2340 

## 2010-10-20 NOTE — ED Notes (Signed)
Pt slipped and fell pta. Pt c/o pain L hand/elbow and tailbone. Pt uncomfortable sitting. Denies hitting head or loc.

## 2010-10-20 NOTE — ED Provider Notes (Signed)
History     CSN: [id number] Arrival date & time: 10/20/2010  6:32 PM  Chief Complaint  Patient presents with  . Arm Pain    (Consider location/radiation/quality/duration/timing/severity/associated sxs/prior treatment) Patient is a 45 y.o. female presenting with arm pain. The history is provided by the patient.  Arm Pain This is a new problem. The current episode started today. The problem occurs daily. The problem has been gradually worsening. Associated symptoms include arthralgias and myalgias. Pertinent negatives include no abdominal pain, chest pain, coughing or neck pain. The symptoms are aggravated by walking and twisting. She has tried nothing for the symptoms. The treatment provided no relief.    Past Medical History  Diagnosis Date  . Rheumatoid arthritis   . Chronic anemia     Past Surgical History  Procedure Date  . Tubal ligation   . Endometrial ablation     History reviewed. No pertinent family history.  History  Substance Use Topics  . Smoking status: Current Everyday Smoker -- 1.0 packs/day    Types: Cigarettes  . Smokeless tobacco: Not on file  . Alcohol Use: No    OB History    Grav Para Term Preterm Abortions TAB SAB Ect Mult Living                  Review of Systems  Constitutional: Negative for activity change.       All ROS Neg except as noted in HPI  HENT: Negative for nosebleeds and neck pain.   Eyes: Negative for photophobia and discharge.  Respiratory: Negative for cough, shortness of breath and wheezing.   Cardiovascular: Negative for chest pain and palpitations.  Gastrointestinal: Negative for abdominal pain and blood in stool.  Genitourinary: Negative for dysuria, frequency and hematuria.  Musculoskeletal: Positive for myalgias and arthralgias. Negative for back pain.  Skin: Negative.   Neurological: Negative for dizziness, seizures and speech difficulty.  Psychiatric/Behavioral: Negative for hallucinations and confusion.     Allergies  Review of patient's allergies indicates no known allergies.  Home Medications   Current Outpatient Rx  Name Route Sig Dispense Refill  . CALCIUM 600 + D PO Oral Take 1 tablet by mouth daily.      Marland Kitchen FERROUS SULFATE 325 (65 FE) MG PO TABS Oral Take 325 mg by mouth 2 (two) times daily. For anemia     . FOLIC ACID 1 MG PO TABS Oral Take 1 mg by mouth daily.      Marland Kitchen METHOTREXATE 2.5 MG PO TABS Oral Take 2.5 mg by mouth once a week. Take 5 tablets every Friday and Saturday as directed. Caution:Chemotherapy. Protect from light.     Marland Kitchen PREDNISONE 5 MG PO TABS Oral Take 10 mg by mouth daily.        BP 145/83  Pulse 72  Temp(Src) 98.1 F (36.7 C) (Oral)  Resp 20  Ht 5\' 6"  (1.676 m)  Wt 150 lb (68.04 kg)  BMI 24.21 kg/m2  SpO2 100%  Physical Exam  Nursing note and vitals reviewed. Constitutional: She is oriented to person, place, and time. She appears well-developed and well-nourished.  Non-toxic appearance.  HENT:  Head: Normocephalic.  Right Ear: Tympanic membrane and external ear normal.  Left Ear: Tympanic membrane and external ear normal.  Eyes: EOM and lids are normal. Pupils are equal, round, and reactive to light.  Neck: Normal range of motion. Neck supple. Carotid bruit is not present.  Cardiovascular: Normal rate, regular rhythm, normal heart sounds, intact distal pulses and  normal pulses.   Pulmonary/Chest: Breath sounds normal. No respiratory distress.       No rib pain  Abdominal: Soft. Bowel sounds are normal. There is no tenderness. There is no guarding.  Genitourinary:       Pain to palpation of the distal coccyx aarea. No bruising.(chaperone present)  Musculoskeletal: Normal range of motion.       Minimal lumbar area pain. Good ROM of the left shoulder, elbow, and wrist. Mild soreness of the wrist with attempted ROM.  Lymphadenopathy:       Head (right side): No submandibular adenopathy present.       Head (left side): No submandibular adenopathy  present.    She has no cervical adenopathy.  Neurological: She is alert and oriented to person, place, and time. She has normal strength. No cranial nerve deficit or sensory deficit.  Skin: Skin is warm and dry.  Psychiatric: She has a normal mood and affect. Her speech is normal.    ED Course:Xrays  Results reviewed with pt. Plan discussed.  Procedures (including critical care time)  Labs Reviewed - No data to display Dg Sacrum/coccyx  10/20/2010  *RADIOLOGY REPORT*  Clinical Data: Larey Seat 1 hour ago with pain  SACRUM AND COCCYX - 2+ VIEW  Comparison: CT abdomen pelvis of 09/19/2010  Findings: The sacral coccygeal elements are in normal alignment. No acute fracture is seen.  The SI joints appear normal. There is degenerative change in the lower lumbar spine.  IMPRESSION: No fracture.  Degenerative change in the lower lumbar spine.  Original Report Authenticated By: Juline Patch, M.D.   Dg Elbow Complete Left  10/20/2010  *RADIOLOGY REPORT*  Clinical Data: Larey Seat an hour ago with pain  LEFT ELBOW - COMPLETE 3+ VIEW  Comparison: None.  Findings: No acute fracture is seen.  Alignment is normal.  No elbow joint effusion is noted.  IMPRESSION: Negative left elbow.  Original Report Authenticated By: Juline Patch, M.D.   Dg Wrist Complete Left  10/20/2010  *RADIOLOGY REPORT*  Clinical Data: Larey Seat recently with pain  LEFT WRIST - COMPLETE 3+ VIEW  Comparison: None.  Findings: The radiocarpal joint space appears normal.  There is only a slight ulnar negative variance.  The carpal bones are in normal position.  No acute fracture is seen.  IMPRESSION: No acute abnormality.  Original Report Authenticated By: Juline Patch, M.D.     Dx: 1. Contusion to the coccyx   2. Wrist sprain - left   MDM  I have reviewed nursing notes, vital signs, and all appropriate lab and imaging results for this patient.        Kathie Dike, [place] 10/20/10 1946

## 2010-12-23 ENCOUNTER — Encounter (HOSPITAL_COMMUNITY): Payer: Self-pay

## 2010-12-23 ENCOUNTER — Emergency Department (HOSPITAL_COMMUNITY)
Admission: EM | Admit: 2010-12-23 | Discharge: 2010-12-24 | Disposition: A | Discharge status: Home / Self Care | Payer: Medicaid Other | Attending: Emergency Medicine | Admitting: Emergency Medicine

## 2010-12-23 ENCOUNTER — Emergency Department (HOSPITAL_COMMUNITY): Payer: Medicaid Other

## 2010-12-23 DIAGNOSIS — R109 Unspecified abdominal pain: Secondary | ICD-10-CM | POA: Insufficient documentation

## 2010-12-23 DIAGNOSIS — F17200 Nicotine dependence, unspecified, uncomplicated: Secondary | ICD-10-CM | POA: Insufficient documentation

## 2010-12-23 DIAGNOSIS — N9489 Other specified conditions associated with female genital organs and menstrual cycle: Secondary | ICD-10-CM | POA: Insufficient documentation

## 2010-12-23 LAB — URINE MICROSCOPIC-ADD ON

## 2010-12-23 LAB — CBC
HCT: 40.3 % (ref 36.0–46.0)
Hemoglobin: 13.8 g/dL (ref 12.0–15.0)
MCH: 32.3 pg (ref 26.0–34.0)
MCHC: 34.2 g/dL (ref 30.0–36.0)
MCV: 94.4 fL (ref 78.0–100.0)
Platelets: 290 10*3/uL (ref 150–400)
RBC: 4.27 MIL/uL (ref 3.87–5.11)
RDW: 13.8 % (ref 11.5–15.5)
WBC: 5.9 10*3/uL (ref 4.0–10.5)

## 2010-12-23 LAB — URINALYSIS, ROUTINE W REFLEX MICROSCOPIC
Glucose, UA: 250 mg/dL — AB
Ketones, ur: NEGATIVE mg/dL
Leukocytes, UA: NEGATIVE
Nitrite: NEGATIVE
Specific Gravity, Urine: >1.03 — ABNORMAL HIGH (ref 1.005–1.030)
Urobilinogen, UA: 0.2 mg/dL (ref 0.0–1.0)
pH: 5.5 (ref 5.0–8.0)

## 2010-12-23 LAB — COMPREHENSIVE METABOLIC PANEL
ALT: 13 U/L (ref 0–35)
AST: 20 U/L (ref 0–37)
Albumin: 4 g/dL (ref 3.5–5.2)
Alkaline Phosphatase: 69 U/L (ref 39–117)
BUN: 9 mg/dL (ref 6–23)
CO2: 29 mEq/L (ref 19–32)
Calcium: 10 mg/dL (ref 8.4–10.5)
Chloride: 101 mEq/L (ref 96–112)
Creatinine, Ser: 0.65 mg/dL (ref 0.50–1.10)
GFR calc Af Amer: >90 mL/min (ref >90)
GFR calc non Af Amer: >90 mL/min (ref >90)
Glucose, Bld: 96 mg/dL (ref 70–99)
Potassium: 3.5 mEq/L (ref 3.5–5.1)
Sodium: 138 mEq/L (ref 135–145)
Total Bilirubin: 0.4 mg/dL (ref 0.3–1.2)
Total Protein: 7.2 g/dL (ref 6.0–8.3)

## 2010-12-23 LAB — LIPASE, BLOOD: Lipase: 39 U/L (ref 11–59)

## 2010-12-23 MED ORDER — MORPHINE SULFATE 4 MG/ML IJ SOLN
4.0000 mg | Freq: Once | INTRAMUSCULAR | Status: AC
  Administered 2010-12-23: 4 mg via INTRAVENOUS
  Filled 2010-12-23: qty 1

## 2010-12-23 MED ORDER — SODIUM CHLORIDE 0.9 % IV BOLUS (SEPSIS)
1000.0000 mL | Freq: Once | INTRAVENOUS | Status: AC
  Administered 2010-12-23: 1000 mL via INTRAVENOUS

## 2010-12-23 MED ORDER — IOHEXOL 300 MG/ML  SOLN
100.0000 mL | Freq: Once | INTRAMUSCULAR | Status: AC | PRN
  Administered 2010-12-23: 100 mL via INTRAVENOUS

## 2010-12-23 MED ORDER — ONDANSETRON HCL 4 MG/2ML IJ SOLN
4.0000 mg | Freq: Once | INTRAMUSCULAR | Status: AC
  Administered 2010-12-23: 4 mg via INTRAVENOUS
  Filled 2010-12-23: qty 2

## 2010-12-23 NOTE — ED Notes (Signed)
Patient transported to CT 

## 2010-12-23 NOTE — ED Notes (Signed)
Pt returned from CT °

## 2010-12-23 NOTE — ED Notes (Signed)
MD at bedside. 

## 2010-12-23 NOTE — ED Notes (Signed)
Pt reports generalized abdominal pain that started this morning. Pt reports pain radiates down to her vagina and rectum. Pt reports pain 8/10. Positive bowel sounds. Pt reports passing flatus. Pt states that she has burning on urination, and states that she has had a moderate amount of clear discharge. Pt denies any nausea or vomiting at this time.

## 2010-12-23 NOTE — ED Provider Notes (Signed)
History   This chart was scribed for Monica Razor, MD by Clarita Crane. The patient was seen in room APA06/APA06 and the patient's care was started at 8:52PM.   CSN: [id number] Arrival date & time: 12/23/2010  7:22 PM   First MD Initiated Contact with Patient 12/23/10 2009      Chief Complaint  Patient presents with  . Abdominal Pain  . Pelvic Pain    (Consider location/radiation/quality/duration/timing/severity/associated sxs/prior treatment) HPI [name] is a 45 y.o. female who presents to the Emergency Department complaining of waxing and waning moderate to severe lower abdominal radiating to vaginal and rectal regions described as shooting and sharp onset yesterday and persistent since with associated dysuria. States pain is mildly relieved when assuming fetal position and when applying heating pads and is aggravated by nothing. Patient notes she has experienced similar symptoms one time previously several months ago which resolved on its own after several days. Denies fever, chills, vomiting, diarrhea, vaginal bleeding, vaginal discharge. Reports h/o tubal ligation, rheumatoid arthritis, chronic anemia.   [facility]  Past Medical History  Diagnosis Date  . Rheumatoid arthritis   . Chronic anemia     Past Surgical History  Procedure Date  . Tubal ligation   . Endometrial ablation     No family history on file.  History  Substance Use Topics  . Smoking status: Current Everyday Smoker -- 1.0 packs/day    Types: Cigarettes  . Smokeless tobacco: Not on file  . Alcohol Use: No    OB History    Grav Para Term Preterm Abortions TAB SAB Ect Mult Living                  Review of Systems 10 Systems reviewed and are negative for acute change except as noted in the HPI.  Allergies  Review of patient's allergies indicates no known allergies.  Home Medications   Current Outpatient Rx  Name Route Sig Dispense Refill  . CALCIUM 600 + D PO Oral Take 1 tablet  by mouth daily.      Marland Kitchen FERROUS SULFATE 325 (65 FE) MG PO TABS Oral Take 325 mg by mouth 2 (two) times daily. For anemia     . FOLIC ACID 1 MG PO TABS Oral Take 1 mg by mouth daily.      Marland Kitchen HYDROXYCHLOROQUINE SULFATE 200 MG PO TABS Oral Take 600 mg by mouth daily.      Marland Kitchen METHOTREXATE 2.5 MG PO TABS Oral Take 12.5 mg by mouth once a week. Take 5 tablets every Monday and Tuesdays Caution:Chemotherapy. Protect from light.    Marland Kitchen PREDNISONE 5 MG PO TABS Oral Take 10 mg by mouth daily.        BP 125/77  Pulse 75  Temp(Src) 98.2 F (36.8 C) (Oral)  Resp 16  Ht 5\' 6"  (1.676 m)  Wt 135 lb (61.236 kg)  BMI 21.79 kg/m2  SpO2 96%  Physical Exam  Nursing note and vitals reviewed. Constitutional: She is oriented to person, place, and time. She appears well-developed and well-nourished. No distress.  HENT:  Head: Normocephalic and atraumatic.  Eyes: EOM are normal. Pupils are equal, round, and reactive to light.  Neck: Neck supple. No tracheal deviation present.  Cardiovascular: Normal rate and regular rhythm.  Exam reveals no gallop and no friction rub.   No murmur heard. Pulmonary/Chest: Effort normal. No respiratory distress. She has no wheezes.  Abdominal: Soft. She exhibits no distension and no mass. There is tenderness in  the right lower quadrant, suprapubic area and left lower quadrant. There is guarding (voluntary). There is no rebound.  Genitourinary:       Pelvic with normal external genitalia. No concerning lesions noted. Scant vaginal discharge, likely physiologic. Cervix closed without discharge from os or lesions. No cmt, adnexal mass or singificant tenderness. Rectal no external lesions. Good tone. No masses palpation. Soft brown stool. No tenderness.   Musculoskeletal: Normal range of motion. She exhibits no edema.  Neurological: She is alert and oriented to person, place, and time. No sensory deficit.  Skin: Skin is warm and dry.  Psychiatric: She has a normal mood and affect. Her  behavior is normal.    ED Course  Procedures (including critical care time)  DIAGNOSTIC STUDIES: Oxygen Saturation is 99% on room air, normal by my interpretation.    COORDINATION OF CARE:    Labs Reviewed  URINALYSIS, ROUTINE W REFLEX MICROSCOPIC - Abnormal; Notable for the following:    Specific Gravity, Urine >1.030 (*)    Glucose, UA 250 (*)    Hgb urine dipstick MODERATE (*)    Bilirubin Urine SMALL (*)    Protein, ur TRACE (*)    All other components within normal limits  URINE MICROSCOPIC-ADD ON - Abnormal; Notable for the following:    Squamous Epithelial / LPF FEW (*)    All other components within normal limits  WET PREP, GENITAL - Abnormal; Notable for the following:    Clue Cells, Wet Prep FEW (*)    All other components within normal limits  COMPREHENSIVE METABOLIC PANEL  CBC  LIPASE, BLOOD  GC/CHLAMYDIA PROBE AMP, GENITAL   No results found.   1. Abdominal pain       MDM  44yf with abdominal pain. W/u including labs, urine and CT unrevealing. Unclear etiology at this time but feel safe for DC. HD stable. Will refer to GI. Pt already has gyn fu.      I personally preformed the services scribed in my presence. The recorded information has been reviewed and considered. Monica Razor, MD.   Monica Razor, MD 12/24/10 [phone]

## 2010-12-23 NOTE — ED Notes (Signed)
Pt reports lower abd pain that "shoots into her vagina".  Pt reports dysuria when she urinates.  Pt denies any hematuria.

## 2010-12-24 LAB — WET PREP, GENITAL
Trich, Wet Prep: NONE SEEN
WBC, Wet Prep HPF POC: NONE SEEN
Yeast Wet Prep HPF POC: NONE SEEN

## 2010-12-24 NOTE — ED Notes (Signed)
Patient is resting comfortably. 

## 2010-12-25 LAB — GC/CHLAMYDIA PROBE AMP, GENITAL
Chlamydia, DNA Probe: NEGATIVE
GC Probe Amp, Genital: NEGATIVE

## 2010-12-29 ENCOUNTER — Encounter (HOSPITAL_COMMUNITY)
Admission: RE | Admit: 2010-12-29 | Discharge: 2010-12-29 | Disposition: A | Discharge status: Home / Self Care | Payer: Medicaid Other | Source: Ambulatory Visit | Attending: General Surgery | Admitting: General Surgery

## 2010-12-29 ENCOUNTER — Encounter (HOSPITAL_COMMUNITY): Payer: Self-pay

## 2010-12-29 LAB — DIFFERENTIAL
Basophils Absolute: 0 10*3/uL (ref 0.0–0.1)
Basophils Relative: 0 % (ref 0–1)
Eosinophils Relative: 3 % (ref 0–5)
Lymphocytes Relative: 35 % (ref 12–46)
Monocytes Absolute: 0.4 10*3/uL (ref 0.1–1.0)
Neutro Abs: 1.7 10*3/uL (ref 1.7–7.7)

## 2010-12-29 LAB — CBC
HCT: 40.4 % (ref 36.0–46.0)
MCHC: 34.4 g/dL (ref 30.0–36.0)
Platelets: 321 10*3/uL (ref 150–400)
RDW: 13.5 % (ref 11.5–15.5)
WBC: 3.4 10*3/uL — ABNORMAL LOW (ref 4.0–10.5)

## 2010-12-29 LAB — SURGICAL PCR SCREEN: MRSA, PCR: NEGATIVE

## 2010-12-29 LAB — BASIC METABOLIC PANEL
BUN: 5 mg/dL — ABNORMAL LOW (ref 6–23)
Calcium: 9.9 mg/dL (ref 8.4–10.5)
Chloride: 98 mEq/L (ref 96–112)
Creatinine, Ser: 0.65 mg/dL (ref 0.50–1.10)
GFR calc Af Amer: >90 mL/min (ref >90)

## 2010-12-29 NOTE — H&P (Signed)
  NTS SOAP Note  Vital Signs:  Vitals as of: 12/25/2010: Systolic 135: Diastolic 94: Heart Rate 77: Temp 96.50F: Height 3ft 6in: Weight 137Lbs 0 Ounces: Pain Level 8: BMI 22  BMI : 22.11 kg/m2  Subjective: This 45 Years 54 Months old Female presents for of cyst on R breast.  Patient has noted a nodule on the R breast for many months.  Occassionally increases in size and redness.  No sig drainage.  No fever or chills.  Painful when swollen.  No other changes.  No history of breast disease.  Review of Symptoms:  Constitutional:unremarkable Head:unremarkable Eyes:unremarkable Nose/Mouth/Throat:unremarkable Cardiovascular:unremarkable Respiratory:unremarkable Gastrointestinal:unremarkable Genitourinary:unremarkable Musculoskeletal:unremarkable Skin:unremarkable as per HPI Hematolgic/Lymphatic:unremarkable Allergic/Immunologic:unremarkable   Past Medical History:Obtained   Past Medical History  Pregnancy Gravida: 5 Pregnancy Para: 5 Surgical History: Uterin ablation, tubal ligation Medical Problems: RA, HTN Psychiatric History: none Allergies: NKDA Medications: Methotrexate, HCTZ, Folate, Fe, Chantix   Social History:Obtained   Social History  Preferred Language: English ([place]) Race:  White Ethnicity: Not Hispanic / Latino Age: 45 Years 9 Months Marital Status:  S Alcohol: none Recreational drug(s): none   Smoking Status: Current every day smoker reviewed on 12/25/2010 Started Date: 01/12/1985 Packs per day: 1.00   Family History:Obtained   Family History  Is there a family history ZO:XWRUEAV CA, HTN, COPD    Objective Information: General:Well appearing, well nourished in no distress. Skin:no rash or prominent lesions Head:Atraumatic; no masses; no abnormalities Eyes:conjunctiva clear, EOM intact, PERRL Mouth:Mucous membranes moist, no mucosal lesions. Neck:Supple without  lymphadenopathy.  Heart:RRR, no murmur Lungs:CTA bilaterally, no wheezes, rhonchi, rales.  Breathing unlabored.   Superficial nodule or medial R breast.  Nontender.  Mobile. Abdomen:Soft, NT/ND, no HSM, no masses. Extremities:No deformities, clubbing, cyanosis, or edema.   Assessment:  Diagnosis &amp; Procedure: DiagnosisCode: 782.2, ProcedureCode: 40981,    Plan: Soft tissue mass R breast.  Doubt true breast etiology given exam.  More suspicious for cytic change.  Options discussed  Patient Education:Alternative treatments to surgery were discussed with patient (and family).Risks and benefits  of procedure were fully explained to the patient (and family) who gave informed consent. Patient/family questions were addressed.  Follow-up:Pending Surgery

## 2010-12-29 NOTE — Patient Instructions (Signed)
PATIENT INSTRUCTIONS POST-ANESTHESIA  IMMEDIATELY FOLLOWING SURGERY:  Do not drive or operate machinery for the first twenty four hours after surgery.  Do not make any important decisions for twenty four hours after surgery or while taking narcotic pain medications or sedatives.  If you develop intractable nausea and vomiting or a severe headache please notify your doctor immediately.  FOLLOW-UP:  Please make an appointment with your surgeon as instructed. You do not need to follow up with anesthesia unless specifically instructed to do so.  WOUND CARE INSTRUCTIONS (if applicable):  Keep a dry clean dressing on the anesthesia/puncture wound site if there is drainage.  Once the wound has quit draining you may leave it open to air.  Generally you should leave the bandage intact for twenty four hours unless there is drainage.  If the epidural site drains for more than 36-48 hours please call the anesthesia department.  QUESTIONS?:  Please feel free to call your physician or the hospital operator if you have any questions, and they will be happy to assist you.     [facility] Anesthesia Department [address] [place] [phone]    [address]  12/29/2010   Your procedure is scheduled on:  12/31/10  Report to [facility] [place] AM.  Call this number if you have problems the morning of surgery: [phone]   Remember:   Do not eat food:After Midnight.  May have clear liquids:until Midnight .  Clear liquids include soda, tea, black coffee, apple or grape juice, broth.  Take these medicines the morning of surgery with A SIP OF WATER: none   Do not wear jewelry, make-up or nail polish.  Do not wear lotions, powders, or perfumes. You may wear deodorant.  Do not shave 48 hours prior to surgery.  Do not bring valuables to the hospital.  Contacts, dentures or bridgework may not be worn into surgery.  Leave suitcase in the car. After surgery it may be brought to your  room.  For patients admitted to the hospital, checkout time is 11:00 AM the day of discharge.   Patients discharged the day of surgery will not be allowed to drive home.  Name and phone number of your driver: friend  Special Instructions: CHG Shower Use Special Wash: 1/2 bottle night before surgery and 1/2 bottle morning of surgery.   Please read over the following fact sheets that you were given: Pain Booklet, Surgical Site Infection Prevention, Anesthesia Post-op Instructions and Care and Recovery After Surgery

## 2010-12-31 ENCOUNTER — Encounter (HOSPITAL_COMMUNITY): Payer: Self-pay | Admitting: *Deleted

## 2010-12-31 ENCOUNTER — Ambulatory Visit (HOSPITAL_COMMUNITY)
Admission: RE | Admit: 2010-12-31 | Discharge: 2010-12-31 | Disposition: A | Discharge status: Home / Self Care | Payer: Medicaid Other | Source: Ambulatory Visit | Attending: General Surgery | Admitting: General Surgery

## 2010-12-31 ENCOUNTER — Other Ambulatory Visit: Payer: Self-pay | Admitting: General Surgery

## 2010-12-31 ENCOUNTER — Encounter (HOSPITAL_COMMUNITY)
Admission: RE | Disposition: A | Discharge status: Home / Self Care | Payer: Self-pay | Source: Ambulatory Visit | Attending: General Surgery

## 2010-12-31 ENCOUNTER — Ambulatory Visit (HOSPITAL_COMMUNITY): Payer: Medicaid Other | Admitting: Anesthesiology

## 2010-12-31 ENCOUNTER — Encounter (HOSPITAL_COMMUNITY): Payer: Self-pay | Admitting: Anesthesiology

## 2010-12-31 DIAGNOSIS — Z79899 Other long term (current) drug therapy: Secondary | ICD-10-CM | POA: Insufficient documentation

## 2010-12-31 DIAGNOSIS — N6009 Solitary cyst of unspecified breast: Secondary | ICD-10-CM | POA: Insufficient documentation

## 2010-12-31 DIAGNOSIS — N6089 Other benign mammary dysplasias of unspecified breast: Secondary | ICD-10-CM

## 2010-12-31 DIAGNOSIS — I10 Essential (primary) hypertension: Secondary | ICD-10-CM | POA: Insufficient documentation

## 2010-12-31 HISTORY — PX: BREAST CYST EXCISION: SHX579

## 2010-12-31 LAB — POCT I-STAT 4, (NA,K, GLUC, HGB,HCT)
Hemoglobin: 13.9 g/dL (ref 12.0–15.0)
Sodium: 140 mEq/L (ref 135–145)

## 2010-12-31 SURGERY — EXCISION, CYST, BREAST
Anesthesia: General | Site: Breast | Laterality: Right | Wound class: Clean

## 2010-12-31 MED ORDER — ENOXAPARIN SODIUM 40 MG/0.4ML ~~LOC~~ SOLN
SUBCUTANEOUS | Status: AC
  Administered 2010-12-31: 40 mg via SUBCUTANEOUS
  Filled 2010-12-31: qty 0.4

## 2010-12-31 MED ORDER — PROPOFOL 10 MG/ML IV BOLUS
INTRAVENOUS | Status: DC | PRN
  Administered 2010-12-31: 140 mg via INTRAVENOUS

## 2010-12-31 MED ORDER — BACITRACIN ZINC 500 UNIT/GM EX OINT
TOPICAL_OINTMENT | CUTANEOUS | Status: AC
  Filled 2010-12-31: qty 1.8

## 2010-12-31 MED ORDER — FENTANYL CITRATE 0.05 MG/ML IJ SOLN
INTRAMUSCULAR | Status: AC
  Filled 2010-12-31: qty 2

## 2010-12-31 MED ORDER — SODIUM CHLORIDE 0.9 % IR SOLN
Status: DC | PRN
  Administered 2010-12-31: 1000 mL

## 2010-12-31 MED ORDER — CEFAZOLIN SODIUM 1-5 GM-% IV SOLN
INTRAVENOUS | Status: DC | PRN
  Administered 2010-12-31: 1 g via INTRAVENOUS

## 2010-12-31 MED ORDER — MIDAZOLAM HCL 5 MG/5ML IJ SOLN
INTRAMUSCULAR | Status: DC | PRN
  Administered 2010-12-31: 2 mg via INTRAVENOUS

## 2010-12-31 MED ORDER — FENTANYL CITRATE 0.05 MG/ML IJ SOLN: 25.0000 ug | INTRAMUSCULAR | Status: DC | PRN

## 2010-12-31 MED ORDER — PREDNISONE 5 MG PO TABS: 10.0000 mg | ORAL_TABLET | Freq: Every day | ORAL | Status: DC

## 2010-12-31 MED ORDER — ONDANSETRON HCL 4 MG/2ML IJ SOLN: 4.0000 mg | Freq: Once | INTRAMUSCULAR | Status: DC | PRN

## 2010-12-31 MED ORDER — HYDROCODONE-ACETAMINOPHEN 5-325 MG PO TABS: 1.0000 | ORAL_TABLET | ORAL | Status: AC | PRN

## 2010-12-31 MED ORDER — LIDOCAINE HCL (PF) 1 % IJ SOLN
INTRAMUSCULAR | Status: AC
  Filled 2010-12-31: qty 5

## 2010-12-31 MED ORDER — CEFAZOLIN SODIUM 1-5 GM-% IV SOLN
INTRAVENOUS | Status: AC
  Filled 2010-12-31: qty 50

## 2010-12-31 MED ORDER — CELECOXIB 100 MG PO CAPS
ORAL_CAPSULE | ORAL | Status: AC
  Administered 2010-12-31: 400 mg
  Filled 2010-12-31: qty 4

## 2010-12-31 MED ORDER — LACTATED RINGERS IV SOLN
INTRAVENOUS | Status: DC
  Administered 2010-12-31: 08:00:00 via INTRAVENOUS

## 2010-12-31 MED ORDER — PROPOFOL 10 MG/ML IV EMUL
INTRAVENOUS | Status: AC
  Filled 2010-12-31: qty 20

## 2010-12-31 MED ORDER — CELECOXIB 100 MG PO CAPS: 400.0000 mg | ORAL_CAPSULE | Freq: Every day | ORAL | Status: DC

## 2010-12-31 MED ORDER — MIDAZOLAM HCL 2 MG/2ML IJ SOLN
INTRAMUSCULAR | Status: AC
  Filled 2010-12-31: qty 2

## 2010-12-31 MED ORDER — BUPIVACAINE HCL (PF) 0.5 % IJ SOLN
INTRAMUSCULAR | Status: DC | PRN
  Administered 2010-12-31: 10 mL

## 2010-12-31 MED ORDER — LIDOCAINE HCL 1 % IJ SOLN
INTRAMUSCULAR | Status: DC | PRN
  Administered 2010-12-31: 30 mg via INTRADERMAL

## 2010-12-31 MED ORDER — FENTANYL CITRATE 0.05 MG/ML IJ SOLN
INTRAMUSCULAR | Status: DC | PRN
  Administered 2010-12-31 (×2): 50 ug via INTRAVENOUS

## 2010-12-31 MED ORDER — BUPIVACAINE HCL (PF) 0.5 % IJ SOLN
INTRAMUSCULAR | Status: AC
  Filled 2010-12-31: qty 30

## 2010-12-31 MED ORDER — MIDAZOLAM HCL 2 MG/2ML IJ SOLN
INTRAMUSCULAR | Status: AC
  Administered 2010-12-31: 2 mg via INTRAVENOUS
  Filled 2010-12-31: qty 2

## 2010-12-31 MED ORDER — MIDAZOLAM HCL 2 MG/2ML IJ SOLN
1.0000 mg | INTRAMUSCULAR | Status: DC | PRN
  Administered 2010-12-31 (×2): 2 mg via INTRAVENOUS

## 2010-12-31 MED ORDER — ENOXAPARIN SODIUM 40 MG/0.4ML ~~LOC~~ SOLN
40.0000 mg | Freq: Once | SUBCUTANEOUS | Status: AC
  Administered 2010-12-31: 40 mg via SUBCUTANEOUS

## 2010-12-31 MED ORDER — CEFAZOLIN SODIUM 1-5 GM-% IV SOLN: 1.0000 g | INTRAVENOUS | Status: DC

## 2010-12-31 SURGICAL SUPPLY — 40 items
APL SKNCLS STERI-STRIP NONHPOA (GAUZE/BANDAGES/DRESSINGS) ×2
BAG HAMPER (MISCELLANEOUS) ×3 IMPLANT
BENZOIN TINCTURE PRP APPL 2/3 (GAUZE/BANDAGES/DRESSINGS) ×3 IMPLANT
CLOTH BEACON ORANGE TIMEOUT ST (SAFETY) ×3 IMPLANT
COVER LIGHT HANDLE STERIS (MISCELLANEOUS) ×6 IMPLANT
DURAPREP 26ML APPLICATOR (WOUND CARE) ×3 IMPLANT
ELECT NDL TIP 2.8 STRL (NEEDLE) IMPLANT
ELECT NEEDLE TIP 2.8 STRL (NEEDLE) ×3 IMPLANT
ELECT REM PT RETURN 9FT ADLT (ELECTROSURGICAL) ×3
ELECTRODE REM PT RTRN 9FT ADLT (ELECTROSURGICAL) ×2 IMPLANT
FORMALIN 10 PREFIL 120ML (MISCELLANEOUS) ×3 IMPLANT
GLOVE BIOGEL PI IND STRL 7.5 (GLOVE) ×2 IMPLANT
GLOVE BIOGEL PI INDICATOR 7.5 (GLOVE) ×1
GLOVE ECLIPSE 6.5 STRL STRAW (GLOVE) ×2 IMPLANT
GLOVE ECLIPSE 7.0 STRL STRAW (GLOVE) ×3 IMPLANT
GLOVE INDICATOR 7.0 STRL GRN (GLOVE) ×2 IMPLANT
GLOVE INDICATOR 8.5 STRL (GLOVE) ×2 IMPLANT
GLOVE SS BIOGEL STRL SZ 8 (GLOVE) ×1 IMPLANT
GLOVE SUPERSENSE BIOGEL SZ 8 (GLOVE) ×1
GOWN BRE IMP SLV AUR XL STRL (GOWN DISPOSABLE) ×4 IMPLANT
GOWN STRL REIN 3XL LVL4 (GOWN DISPOSABLE) ×2 IMPLANT
GOWN STRL REIN XL XLG (GOWN DISPOSABLE) ×2 IMPLANT
KIT ROOM TURNOVER APOR (KITS) ×3 IMPLANT
MANIFOLD NEPTUNE II (INSTRUMENTS) ×3 IMPLANT
NDL HYPO 18GX1.5 BLUNT FILL (NEEDLE) IMPLANT
NDL HYPO 25X1 1.5 SAFETY (NEEDLE) ×1 IMPLANT
NEEDLE HYPO 18GX1.5 BLUNT FILL (NEEDLE) ×3 IMPLANT
NEEDLE HYPO 25X1 1.5 SAFETY (NEEDLE) ×3 IMPLANT
NS IRRIG 1000ML POUR BTL (IV SOLUTION) ×3 IMPLANT
PACK MINOR (CUSTOM PROCEDURE TRAY) ×3 IMPLANT
PAD ARMBOARD 7.5X6 YLW CONV (MISCELLANEOUS) ×3 IMPLANT
SET BASIN LINEN APH (SET/KITS/TRAYS/PACK) ×3 IMPLANT
SOL PREP PROV IODINE SCRUB 4OZ (MISCELLANEOUS) IMPLANT
STRIP CLOSURE SKIN 1/2X4 (GAUZE/BANDAGES/DRESSINGS) ×3 IMPLANT
SUT MNCRL AB 4-0 PS2 18 (SUTURE) ×3 IMPLANT
SUT PROLENE 3 0 PS 1 (SUTURE) IMPLANT
SUT VIC AB 3-0 SH 27 (SUTURE) ×3
SUT VIC AB 3-0 SH 27X BRD (SUTURE) ×1 IMPLANT
SYR BULB IRRIGATION 50ML (SYRINGE) ×3 IMPLANT
SYR CONTROL 10ML LL (SYRINGE) ×3 IMPLANT

## 2010-12-31 NOTE — Transfer of Care (Signed)
Immediate Anesthesia Transfer of Care Note  Patient: [name]  Procedure(s) Performed:  CYST EXCISION BREAST - Excision Sebaceous Cyst Right Breast  Patient Location: PACU  Anesthesia Type: General  Level of Consciousness: awake, alert , oriented and patient cooperative  Airway & Oxygen Therapy: Patient Spontanous Breathing  Post-op Assessment: Report given to PACU RN, Post -op Vital signs reviewed and stable and Patient moving all extremities  Post vital signs: Reviewed and stable  Complications: No apparent anesthesia complications

## 2010-12-31 NOTE — Anesthesia Postprocedure Evaluation (Signed)
  Anesthesia Post-op Note  Patient: [name]  Procedure(s) Performed:  CYST EXCISION BREAST - Excision Sebaceous Cyst Right Breast  Patient Location: PACU  Anesthesia Type: General  Level of Consciousness: awake, alert , oriented and patient cooperative  Airway and Oxygen Therapy: Patient Spontanous Breathing  Post-op Pain: 3 /10, mild  Post-op Assessment: Post-op Vital signs reviewed, Patient's Cardiovascular Status Stable, Respiratory Function Stable, Patent Airway and No signs of Nausea or vomiting  Post-op Vital Signs: Reviewed and stable  Complications: No apparent anesthesia complications

## 2010-12-31 NOTE — Interval H&P Note (Signed)
History and Physical Interval Note:  12/31/2010 7:45 AM  [name]  has presented today for surgery, with the diagnosis of Soft tissue mass right breast  The various methods of treatment have been discussed with the patient and family. After consideration of risks, benefits and other options for treatment, the patient has consented to  Procedure(s): EXCISION OF BREAST BIOPSY as a surgical intervention .  The patients' history has been reviewed, patient examined, no change in status, stable for surgery.  I have reviewed the patients' chart and labs.  Questions were answered to the patient's satisfaction.     Deane Melick C

## 2010-12-31 NOTE — Anesthesia Procedure Notes (Addendum)
Procedure Name: LMA Insertion Performed by: Despina Hidden Pre-anesthesia Checklist: Suction available, Emergency Drugs available, Patient identified and Patient being monitored Patient Re-evaluated:Patient Re-evaluated prior to inductionOxygen Delivery Method: Circle System Utilized Preoxygenation: Pre-oxygenation with 100% oxygen Intubation Type: IV induction Ventilation: Mask ventilation without difficulty LMA Size: 3.0 Number of attempts: 1 Placement Confirmation: ETT inserted through vocal cords under direct vision,  positive ETCO2 and breath sounds checked- equal and bilateral Tube secured with: Tape Dental Injury: Teeth and Oropharynx as per pre-operative assessment

## 2010-12-31 NOTE — Anesthesia Preprocedure Evaluation (Signed)
Anesthesia Evaluation  Patient identified by MRN, date of birth, ID band Patient awake    Reviewed: Allergy & Precautions, H&P , NPO status , Patient's Chart, lab work & pertinent test results  History of Anesthesia Complications Negative for: history of anesthetic complications  Airway Mallampati: II      Dental  (+) Teeth Intact   Pulmonary Current Smoker,  clear to auscultation        Cardiovascular neg cardio ROS Regular Normal    Neuro/Psych    GI/Hepatic   Endo/Other    Renal/GU      Musculoskeletal  (+) Arthritis -, Rheumatoid disorders,    Abdominal   Peds  Hematology   Anesthesia Other Findings   Reproductive/Obstetrics                           Anesthesia Physical Anesthesia Plan  ASA: II  Anesthesia Plan: General   Post-op Pain Management:    Induction: Intravenous  Airway Management Planned: LMA  Additional Equipment:   Intra-op Plan:   Post-operative Plan:   Informed Consent: I have reviewed the patients History and Physical, chart, labs and discussed the procedure including the risks, benefits and alternatives for the proposed anesthesia with the patient or authorized representative who has indicated his/her understanding and acceptance.     Plan Discussed with:   Anesthesia Plan Comments:         Anesthesia Quick Evaluation

## 2010-12-31 NOTE — Op Note (Signed)
Patient:  [name]  DOB:  24-Apr-1965  MRN:  [id number]   Preop Diagnosis:  Sebaceous cyst right breast  Postop Diagnosis:  The same  Procedure:  Excision of sebaceous cyst via 2 cm incision.  Surgeon:  Dr. Tilford Pillar  Anes:  General via a larger mask airway  Indications:  Patient is a 45 year old female presented to my office with a nodule on her right breast. Evaluation workup was consistent for sebaceous cyst. Risks benefits alternatives of excision were discussed at length patient including but not limited to risk of bleeding, infection, recurrence. Patient's questions and concerns are addressed the patient was consented for the planned procedure.  Procedure note:  Patient states the or was placed in a supine position on the or table. At this point the general anesthetic is administered. At this point she has a laryngeal mask airway placed by the nurse anesthetist. Her right breast is then prepped with DuraPrep solution and draped in standard fashion. A marking pen was utilized to [place] the planned elliptical incision. A 15 blade scalpel utilized to create the initial skin incision an additional dissection down to subcuticular tissue including circumferential dissection around the cyst was carried out using electrocautery. Upon circumferentially dissecting the cyst free is placed in the back table and sent as a permanent specimen to pathology. Hemostasis had been obtained using electrocautery. The wound is irrigated. A 3-0 Vicryl was utilized reapproximate the deep subcutaneous tissue. A 4-0 Monocryl was utilized reapproximate the skin edges in running subcuticular suture. The skin was washed dried moist dry towel. Benzoin is applied around incision. Half-inch Steri-Strips are placed. Patient was allowed to come out of general anesthetic and stretcher the PACU in stable condition. At the conclusion of the procedure all instrument, sponge, needle counts are correct. Patient tolerated  procedure extremely well.  Complications:  None apparent  EBL:  Scant  Specimen:  Cyst

## 2011-01-09 ENCOUNTER — Encounter (HOSPITAL_COMMUNITY): Payer: Self-pay | Admitting: General Surgery

## 2011-02-26 ENCOUNTER — Emergency Department (HOSPITAL_COMMUNITY)
Admission: EM | Admit: 2011-02-26 | Discharge: 2011-02-26 | Disposition: A | Discharge status: Home / Self Care | Payer: Medicaid Other | Attending: Emergency Medicine | Admitting: Emergency Medicine

## 2011-02-26 ENCOUNTER — Encounter (HOSPITAL_COMMUNITY): Payer: Self-pay | Admitting: *Deleted

## 2011-02-26 DIAGNOSIS — M549 Dorsalgia, unspecified: Secondary | ICD-10-CM

## 2011-02-26 DIAGNOSIS — F17200 Nicotine dependence, unspecified, uncomplicated: Secondary | ICD-10-CM | POA: Insufficient documentation

## 2011-02-26 DIAGNOSIS — D649 Anemia, unspecified: Secondary | ICD-10-CM | POA: Insufficient documentation

## 2011-02-26 DIAGNOSIS — I10 Essential (primary) hypertension: Secondary | ICD-10-CM | POA: Insufficient documentation

## 2011-02-26 DIAGNOSIS — M069 Rheumatoid arthritis, unspecified: Secondary | ICD-10-CM | POA: Insufficient documentation

## 2011-02-26 DIAGNOSIS — Z79899 Other long term (current) drug therapy: Secondary | ICD-10-CM | POA: Insufficient documentation

## 2011-02-26 HISTORY — DX: Essential (primary) hypertension: I10

## 2011-02-26 MED ORDER — PREDNISONE 20 MG PO TABS
60.0000 mg | ORAL_TABLET | Freq: Once | ORAL | Status: AC
  Administered 2011-02-26: 60 mg via ORAL
  Filled 2011-02-26: qty 3

## 2011-02-26 MED ORDER — HYDROCODONE-ACETAMINOPHEN 5-325 MG PO TABS: 1.0000 | ORAL_TABLET | ORAL | Status: AC | PRN

## 2011-02-26 MED ORDER — METHOCARBAMOL 500 MG PO TABS: ORAL_TABLET | ORAL | Status: DC

## 2011-02-26 MED ORDER — METHOCARBAMOL 500 MG PO TABS
1000.0000 mg | ORAL_TABLET | Freq: Once | ORAL | Status: AC
  Administered 2011-02-26: 1000 mg via ORAL
  Filled 2011-02-26: qty 2

## 2011-02-26 MED ORDER — ONDANSETRON HCL 4 MG PO TABS
4.0000 mg | ORAL_TABLET | Freq: Once | ORAL | Status: AC
  Administered 2011-02-26: 4 mg via ORAL
  Filled 2011-02-26: qty 1

## 2011-02-26 MED ORDER — HYDROCODONE-ACETAMINOPHEN 5-325 MG PO TABS
2.0000 | ORAL_TABLET | Freq: Once | ORAL | Status: AC
  Administered 2011-02-26: 2 via ORAL
  Filled 2011-02-26: qty 2

## 2011-02-26 NOTE — Discharge Instructions (Signed)
Please call Dr Janna Arch tomorrow for appointment next week concerning your back pains. Please apply heat to your back. Norco and Robaxin may cause drowsiness, use with caution.Lumbosacral Strain Lumbosacral strain is one of the most common causes of back pain. There are many causes of back pain. Most are not serious conditions. CAUSES  Your backbone (spinal column) is made up of 24 main vertebral bodies, the sacrum, and the coccyx. These are held together by muscles and tough, fibrous tissue (ligaments). Nerve roots pass through the openings between the vertebrae. A sudden move or injury to the back may cause injury to, or pressure on, these nerves. This may result in localized back pain or pain movement (radiation) into the buttocks, down the leg, and into the foot. Sharp, shooting pain from the buttock down the back of the leg (sciatica) is frequently associated with a ruptured (herniated) disk. Pain may be caused by muscle spasm alone. Your caregiver can often find the cause of your pain by the details of your symptoms and an exam. In some cases, you may need tests (such as X-rays). Your caregiver will work with you to decide if any tests are needed based on your specific exam. HOME CARE INSTRUCTIONS   Avoid an underactive lifestyle. Active exercise, as directed by your caregiver, is your greatest weapon against back pain.   Avoid hard physical activities (tennis, racquetball, waterskiing) if you are not in proper physical condition for it. This may aggravate or create problems.   If you have a back problem, avoid sports requiring sudden body movements. Swimming and walking are generally safer activities.   Maintain good posture.   Avoid becoming overweight (obese).   Use bed rest for only the most extreme, sudden (acute) episode. Your caregiver will help you determine how much bed rest is necessary.   For acute conditions, you may put ice on the injured area.   Put ice in a plastic bag.    Place a towel between your skin and the bag.   Leave the ice on for 15 to 20 minutes at a time, every 2 hours, or as needed.   After you are improved and more active, it may help to apply heat for 30 minutes before activities.  See your caregiver if you are having pain that lasts longer than expected. Your caregiver can advise appropriate exercises or therapy if needed. With conditioning, most back problems can be avoided. SEEK IMMEDIATE MEDICAL CARE IF:   You have numbness, tingling, weakness, or problems with the use of your arms or legs.   You experience severe back pain not relieved with medicines.   There is a change in bowel or bladder control.   You have increasing pain in any area of the body, including your belly (abdomen).   You notice shortness of breath, dizziness, or feel faint.   You feel sick to your stomach (nauseous), are throwing up (vomiting), or become sweaty.   You notice discoloration of your toes or legs, or your feet get very cold.   Your back pain is getting worse.   You have a fever.  MAKE SURE YOU:   Understand these instructions.   Will watch your condition.   Will get help right away if you are not doing well or get worse.  Document Released: 10/08/2004 Document Revised: 09/10/2010 Document Reviewed: 03/30/2008 [facility] Patient Information 2012 [place], [place].

## 2011-02-26 NOTE — ED Notes (Signed)
Back pain after picking up lumber

## 2011-02-26 NOTE — ED Provider Notes (Signed)
History     CSN: [id number]  Arrival date & time 02/26/11  1744   First MD Initiated Contact with Patient 02/26/11 1913      Chief Complaint  Patient presents with  . Back Pain    (Consider location/radiation/quality/duration/timing/severity/associated sxs/prior treatment) HPI Comments: Patient reports she has been seen in the emergency department on several occasions because of problems with her back. Today she was moving some lumber while making some improvements to her home when she felt something" pop in her lower back" and has been having pain extending into her right thigh since that time. She denies loss of bowel or bladder function. She has not had falls after this incident. She has been able to walk but is walking with a limp. She tried resting the lower back but continued to have " excruciating pain".  Patient is a 46 y.o. female presenting with back pain. The history is provided by the patient.  Back Pain  This is a recurrent problem. The current episode started 3 to 5 hours ago. Pertinent negatives include no chest pain, no abdominal pain and no dysuria.    Past Medical History  Diagnosis Date  . Rheumatoid arthritis   . Chronic anemia   . Hypertension     Past Surgical History  Procedure Date  . Tubal ligation   . Endometrial ablation   . Breast cyst excision 12/31/2010    Procedure: CYST EXCISION BREAST;  Surgeon: Fabio Bering, MD;  Location: [facility];  Service: General;  Laterality: Right;  Excision Sebaceous Cyst Right Breast    Family History  Problem Relation Age of Onset  . Anesthesia problems Neg Hx     History  Substance Use Topics  . Smoking status: Current Everyday Smoker -- 0.5 packs/day    Types: Cigarettes  . Smokeless tobacco: Not on file  . Alcohol Use: No     occassional    OB History    Grav Para Term Preterm Abortions TAB SAB Ect Mult Living                  Review of Systems  Constitutional: Negative for activity change.    All ROS Neg except as noted in HPI  HENT: Negative for nosebleeds and neck pain.   Eyes: Negative for photophobia and discharge.  Respiratory: Negative for cough, shortness of breath and wheezing.   Cardiovascular: Negative for chest pain and palpitations.  Gastrointestinal: Negative for abdominal pain and blood in stool.  Genitourinary: Negative for dysuria, frequency and hematuria.  Musculoskeletal: Positive for back pain. Negative for arthralgias.  Skin: Negative.   Neurological: Negative for dizziness, seizures and speech difficulty.  Psychiatric/Behavioral: Negative for hallucinations and confusion.    Allergies  Review of patient's allergies indicates no known allergies.  Home Medications   Current Outpatient Rx  Name Route Sig Dispense Refill  . CALCIUM 600 + D PO Oral Take 1 tablet by mouth daily.      Marland Kitchen FERROUS SULFATE 325 (65 FE) MG PO TABS Oral Take 325 mg by mouth 2 (two) times daily. For anemia     . FOLIC ACID 1 MG PO TABS Oral Take 1 mg by mouth daily.      Marland Kitchen HYDROXYCHLOROQUINE SULFATE 200 MG PO TABS Oral Take 400 mg by mouth daily.     Marland Kitchen METHOTREXATE 2.5 MG PO TABS Oral Take 12.5 mg by mouth once a week. Take 5 tablets every Monday and Tuesdays Caution:Chemotherapy. Protect from light.    Marland Kitchen  PREDNISONE 5 MG PO TABS Oral Take 5 mg by mouth daily.    Marland Kitchen HYDROCODONE-ACETAMINOPHEN 5-325 MG PO TABS Oral Take 1 tablet by mouth every 4 (four) hours as needed for pain. 15 tablet 0  . METHOCARBAMOL 500 MG PO TABS  2 po tid for spasm 30 tablet 0    BP 134/92  Pulse 82  Temp(Src) 97.9 F (36.6 C) (Oral)  Resp 18  Ht 5\' 6"  (1.676 m)  Wt 136 lb (61.689 kg)  BMI 21.95 kg/m2  SpO2 100%  Physical Exam  Nursing note and vitals reviewed. Constitutional: She is oriented to person, place, and time. She appears well-developed and well-nourished.  Non-toxic appearance.  HENT:  Head: Normocephalic.  Right Ear: Tympanic membrane and external ear normal.  Left Ear: Tympanic  membrane and external ear normal.  Eyes: EOM and lids are normal. Pupils are equal, round, and reactive to light.  Neck: Normal range of motion. Neck supple. Carotid bruit is not present.  Cardiovascular: Normal rate, regular rhythm, normal heart sounds, intact distal pulses and normal pulses.   Pulmonary/Chest: Breath sounds normal. No respiratory distress.  Abdominal: Soft. Bowel sounds are normal. There is no tenderness. There is no guarding.  Musculoskeletal: Normal range of motion.       Pain to palpation and attempted range of motion of the lower back area. No palpable deformity appreciated. No gross neurologic deficits appreciated.  Lymphadenopathy:       Head (right side): No submandibular adenopathy present.       Head (left side): No submandibular adenopathy present.    She has no cervical adenopathy.  Neurological: She is alert and oriented to person, place, and time. She has normal strength. No cranial nerve deficit or sensory deficit. She exhibits normal muscle tone. Coordination normal.  Skin: Skin is warm and dry.  Psychiatric: She has a normal mood and affect. Her speech is normal.    ED Course  Procedures (including critical care time) Pulse oximetry 100% on room air. Within normal limits by my interpretation. Labs Reviewed - No data to display No results found.   1. Back pain       MDM  I have reviewed nursing notes, vital signs, and all appropriate lab and imaging results for this patient.        Kathie Dike, [place] 02/27/11 [phone]

## 2011-02-26 NOTE — ED Notes (Signed)
Pt DC to home in the care of family 

## 2011-02-28 ENCOUNTER — Encounter (HOSPITAL_COMMUNITY): Payer: Self-pay | Admitting: *Deleted

## 2011-02-28 ENCOUNTER — Emergency Department (HOSPITAL_COMMUNITY)
Admission: EM | Admit: 2011-02-28 | Discharge: 2011-02-28 | Disposition: A | Discharge status: Home / Self Care | Payer: Medicaid Other | Attending: Emergency Medicine | Admitting: Emergency Medicine

## 2011-02-28 ENCOUNTER — Emergency Department (HOSPITAL_COMMUNITY): Payer: Medicaid Other

## 2011-02-28 DIAGNOSIS — X500XXA Overexertion from strenuous movement or load, initial encounter: Secondary | ICD-10-CM | POA: Insufficient documentation

## 2011-02-28 DIAGNOSIS — Z9851 Tubal ligation status: Secondary | ICD-10-CM | POA: Insufficient documentation

## 2011-02-28 DIAGNOSIS — R350 Frequency of micturition: Secondary | ICD-10-CM | POA: Insufficient documentation

## 2011-02-28 DIAGNOSIS — Y92009 Unspecified place in unspecified non-institutional (private) residence as the place of occurrence of the external cause: Secondary | ICD-10-CM | POA: Insufficient documentation

## 2011-02-28 DIAGNOSIS — M549 Dorsalgia, unspecified: Secondary | ICD-10-CM | POA: Insufficient documentation

## 2011-02-28 DIAGNOSIS — M069 Rheumatoid arthritis, unspecified: Secondary | ICD-10-CM | POA: Insufficient documentation

## 2011-02-28 DIAGNOSIS — D649 Anemia, unspecified: Secondary | ICD-10-CM | POA: Insufficient documentation

## 2011-02-28 DIAGNOSIS — F17200 Nicotine dependence, unspecified, uncomplicated: Secondary | ICD-10-CM | POA: Insufficient documentation

## 2011-02-28 DIAGNOSIS — S39012A Strain of muscle, fascia and tendon of lower back, initial encounter: Secondary | ICD-10-CM

## 2011-02-28 DIAGNOSIS — I10 Essential (primary) hypertension: Secondary | ICD-10-CM | POA: Insufficient documentation

## 2011-02-28 DIAGNOSIS — S335XXA Sprain of ligaments of lumbar spine, initial encounter: Secondary | ICD-10-CM | POA: Insufficient documentation

## 2011-02-28 LAB — URINALYSIS, ROUTINE W REFLEX MICROSCOPIC
Bilirubin Urine: NEGATIVE
Leukocytes, UA: NEGATIVE
Nitrite: NEGATIVE
Specific Gravity, Urine: 1.01 (ref 1.005–1.030)
Urobilinogen, UA: 0.2 mg/dL (ref 0.0–1.0)

## 2011-02-28 LAB — URINE MICROSCOPIC-ADD ON

## 2011-02-28 MED ORDER — HYDROMORPHONE HCL PF 1 MG/ML IJ SOLN
1.0000 mg | Freq: Once | INTRAMUSCULAR | Status: AC
  Administered 2011-02-28: 1 mg via INTRAMUSCULAR
  Filled 2011-02-28: qty 1

## 2011-02-28 MED ORDER — ONDANSETRON 8 MG PO TBDP
8.0000 mg | ORAL_TABLET | Freq: Once | ORAL | Status: AC
  Administered 2011-02-28: 8 mg via ORAL
  Filled 2011-02-28: qty 1

## 2011-02-28 MED ORDER — OXYCODONE-ACETAMINOPHEN 5-325 MG PO TABS: 1.0000 | ORAL_TABLET | ORAL | Status: AC | PRN

## 2011-02-28 NOTE — ED Notes (Signed)
Pt a/ox4. Resp even and unlabored. NAD at this time. D/C instructions reviewed with pt. Pt verbalized understanding. Pt ambulated to lobby with steady gate.  

## 2011-02-28 NOTE — ED Notes (Signed)
Pt c/o lower back pain since Thursday. Pt seen in ED for same on Thursday. Pt states that the pain is worse.

## 2011-02-28 NOTE — Discharge Instructions (Signed)
Back Pain, Adult Low back pain is very common. About 1 in 5 people have back pain.The cause of low back pain is rarely dangerous. The pain often gets better over time.About half of people with a sudden onset of back pain feel better in just 2 weeks. About 8 in 10 people feel better by 6 weeks.  CAUSES Some common causes of back pain include:  Strain of the muscles or ligaments supporting the spine.   Wear and tear (degeneration) of the spinal discs.   Arthritis.   Direct injury to the back.  DIAGNOSIS Most of the time, the direct cause of low back pain is not known.However, back pain can be treated effectively even when the exact cause of the pain is unknown.Answering your caregiver's questions about your overall health and symptoms is one of the most accurate ways to make sure the cause of your pain is not dangerous. If your caregiver needs more information, he or she may order lab work or imaging tests (X-rays or MRIs).However, even if imaging tests show changes in your back, this usually does not require surgery. HOME CARE INSTRUCTIONS For many people, back pain returns.Since low back pain is rarely dangerous, it is often a condition that people can learn to manageon their own.   Remain active. It is stressful on the back to sit or stand in one place. Do not sit, drive, or stand in one place for more than 30 minutes at a time. Take short walks on level surfaces as soon as pain allows.Try to increase the length of time you walk each day.   Do not stay in bed.Resting more than 1 or 2 days can delay your recovery.   Do not avoid exercise or work.Your body is made to move.It is not dangerous to be active, even though your back may hurt.Your back will likely heal faster if you return to being active before your pain is gone.   Pay attention to your body when you bend and lift. Many people have less discomfortwhen lifting if they bend their knees, keep the load close to their  bodies,and avoid twisting. Often, the most comfortable positions are those that put less stress on your recovering back.   Find a comfortable position to sleep. Use a firm mattress and lie on your side with your knees slightly bent. If you lie on your back, put a pillow under your knees.   Only take over-the-counter or prescription medicines as directed by your caregiver. Over-the-counter medicines to reduce pain and inflammation are often the most helpful.Your caregiver may prescribe muscle relaxant drugs.These medicines help dull your pain so you can more quickly return to your normal activities and healthy exercise.   Put ice on the injured area.   Put ice in a plastic bag.   Place a towel between your skin and the bag.   Leave the ice on for 15 to 20 minutes, 3 to 4 times a day for the first 2 to 3 days. After that, ice and heat may be alternated to reduce pain and spasms.   Ask your caregiver about trying back exercises and gentle massage. This may be of some benefit.   Avoid feeling anxious or stressed.Stress increases muscle tension and can worsen back pain.It is important to recognize when you are anxious or stressed and learn ways to manage it.Exercise is a great option.  SEEK MEDICAL CARE IF:  You have pain that is not relieved with rest or medicine.   You have   pain that does not improve in 1 week.   You have new symptoms.   You are generally not feeling well.  SEEK IMMEDIATE MEDICAL CARE IF:   You have pain that radiates from your back into your legs.   You develop new bowel or bladder control problems.   You have unusual weakness or numbness in your arms or legs.   You develop nausea or vomiting.   You develop abdominal pain.   You feel faint.  Document Released: 12/29/2004 Document Revised: 09/10/2010 Document Reviewed: 05/19/2010 [facility] Patient Information 2012 [place], [place].   Use the medicine prescribed in place of your hydrocodone.  Your xray and  urinalysis are ok today.   Do not drive within 4 hours of taking oxycodone as this will make you drowsy.  Avoid lifting,  Bending,  Twisting or any other activity that worsens your pain over the next week.  Apply an  icepack  to your lower back for 10-15 minutes every 2 hours for the next 2 days.  You should get rechecked if your symptoms are not better over the next 5 days,  Or you develop increased pain,  Weakness in your leg(s) or loss of bladder or bowel function - these are symptoms of a worse injury.

## 2011-02-28 NOTE — ED Provider Notes (Signed)
History     CSN: [id number]  Arrival date & time 02/28/11  1554   First MD Initiated Contact with Patient 02/28/11 1610      Chief Complaint  Patient presents with  . Back Pain    (Consider location/radiation/quality/duration/timing/severity/associated sxs/prior treatment) HPI Comments: Patient reports increased pain to her lower back 2 days ago, when moving some lumber at her home, feeling a pop in her lower back.  She was seen here 2 days ago and was treated with Robaxin and hydrocodone with no relief of her pain, in fact she reports feeling worse.  She states she has pain that now radiates to her right lateral posterior knee.  She denies weakness in her lower extremities, but has difficulty walking secondary to pain.  She denies any loss of bladder or bowel control.  Patient is a 46 y.o. female presenting with back pain. The history is provided by the patient.  Back Pain  This is a new problem. The problem has been gradually worsening. The pain is associated with lifting heavy objects. The quality of the pain is described as shooting. The pain is at a severity of 10/10. The pain is severe. The symptoms are aggravated by bending, twisting and certain positions. The pain is the same all the time. Pertinent negatives include no chest pain, no fever, no numbness, no headaches, no abdominal pain, no bowel incontinence, no perianal numbness, no bladder incontinence, no dysuria, no paresthesias, no paresis, no tingling and no weakness. Associated symptoms comments: She does report increased frequency of urination, no fevers or dysuria.. She has tried muscle relaxants, analgesics and bed rest for the symptoms. The treatment provided no relief.    Past Medical History  Diagnosis Date  . Rheumatoid arthritis   . Chronic anemia   . Hypertension     Past Surgical History  Procedure Date  . Tubal ligation   . Endometrial ablation   . Breast cyst excision 12/31/2010    Procedure: CYST EXCISION  BREAST;  Surgeon: Fabio Bering, MD;  Location: [facility];  Service: General;  Laterality: Right;  Excision Sebaceous Cyst Right Breast    Family History  Problem Relation Age of Onset  . Anesthesia problems Neg Hx     History  Substance Use Topics  . Smoking status: Current Everyday Smoker -- 0.5 packs/day    Types: Cigarettes  . Smokeless tobacco: Not on file  . Alcohol Use: No     occassional    OB History    Grav Para Term Preterm Abortions TAB SAB Ect Mult Living                  Review of Systems  Constitutional: Negative for fever.  HENT: Negative for sore throat and neck pain.   Eyes: Negative.   Respiratory: Negative for chest tightness and shortness of breath.   Cardiovascular: Negative for chest pain.  Gastrointestinal: Negative for nausea, abdominal pain and bowel incontinence.  Genitourinary: Positive for frequency. Negative for bladder incontinence and dysuria.  Musculoskeletal: Positive for back pain. Negative for joint swelling and arthralgias.  Skin: Negative.  Negative for rash and wound.  Neurological: Negative for dizziness, tingling, weakness, light-headedness, numbness, headaches and paresthesias.  Hematological: Negative.   Psychiatric/Behavioral: Negative.     Allergies  Review of patient's allergies indicates no known allergies.  Home Medications   Current Outpatient Rx  Name Route Sig Dispense Refill  . CALCIUM 600 + D PO Oral Take 1 tablet by mouth daily.      Marland Kitchen  FERROUS SULFATE 325 (65 FE) MG PO TABS Oral Take 325 mg by mouth 2 (two) times daily. For anemia     . FOLIC ACID 1 MG PO TABS Oral Take 1 mg by mouth daily.      Marland Kitchen HYDROCODONE-ACETAMINOPHEN 5-325 MG PO TABS Oral Take 1 tablet by mouth every 4 (four) hours as needed for pain. 15 tablet 0  . HYDROXYCHLOROQUINE SULFATE 200 MG PO TABS Oral Take 400 mg by mouth daily.     Marland Kitchen METHOCARBAMOL 500 MG PO TABS  2 po tid for spasm 30 tablet 0  . METHOTREXATE 2.5 MG PO TABS Oral Take 12.5 mg by  mouth once a week. Take 5 tablets every Tuesday and Wednesday Caution:Chemotherapy. Protect from light.    . METHOCARBAMOL 500 MG PO TABS  2 po tid for spasm 30 tablet 0  . OXYCODONE-ACETAMINOPHEN 5-325 MG PO TABS Oral Take 1 tablet by mouth every 4 (four) hours as needed for pain. 15 tablet 0  . PREDNISONE 5 MG PO TABS Oral Take 5 mg by mouth daily.      BP 131/93  Pulse 77  Temp(Src) 97.5 F (36.4 C) (Oral)  Resp 16  Ht 5\' 6"  (1.676 m)  Wt 136 lb (61.689 kg)  BMI 21.95 kg/m2  SpO2 100%  Physical Exam  Nursing note and vitals reviewed. Constitutional: She is oriented to person, place, and time. She appears well-developed and well-nourished.  HENT:  Head: Normocephalic.  Eyes: Conjunctivae are normal.  Neck: Normal range of motion. Neck supple.  Cardiovascular: Regular rhythm and intact distal pulses.        Pedal pulses normal.  Pulmonary/Chest: Effort normal. She has no wheezes.  Abdominal: Soft. Bowel sounds are normal. She exhibits no distension and no mass.  Musculoskeletal: Normal range of motion. She exhibits no edema.       Lumbar back: She exhibits tenderness. She exhibits no swelling, no edema and no spasm.  Neurological: She is alert and oriented to person, place, and time. She has normal strength. She displays no atrophy and no tremor. No cranial nerve deficit or sensory deficit. Gait normal.  Reflex Scores:      Patellar reflexes are 2+ on the right side and 2+ on the left side.      Achilles reflexes are 2+ on the right side and 2+ on the left side.      No strength deficit noted in hip and knee flexor and extensor muscle groups.  Ankle flexion and extension intact.  Skin: Skin is warm and dry.  Psychiatric: She has a normal mood and affect.    ED Course  Procedures (including critical care time)  Labs Reviewed  URINALYSIS, ROUTINE W REFLEX MICROSCOPIC - Abnormal; Notable for the following:    Hgb urine dipstick TRACE (*)    All other components within normal  limits  POCT PREGNANCY, URINE  URINE MICROSCOPIC-ADD ON   Dg Lumbar Spine Complete  02/28/2011  *RADIOLOGY REPORT*  Clinical Data: Back pain following lifting injury.  LUMBAR SPINE - COMPLETE 4+ VIEW  Comparison: 12/23/2010.  Findings: Five lumbar type vertebral bodies are present. The alignment is anatomic.  Vertebral body height is preserved.  L4-L5 lumbar spondylosis with degenerative endplate changes, loss of the disc space and osteophytes.  Compared to the prior exam of 12/23/2010, there is no interval change.  No fracture is identified.  IMPRESSION: L4-L5 the lumbar spondylosis.  No acute osseous abnormality.  Original Report Authenticated By: Andreas Newport, M.D.  1. Lumbar strain       MDM  No neuro deficit on exam or by history to suggest emergent or surgical presentation.  Percocet prescribed.             Candis Musa, PA 02/28/11 2158

## 2011-03-01 NOTE — ED Provider Notes (Signed)
Medical screening examination/treatment/procedure(s) were performed by non-physician practitioner and as supervising physician I was immediately available for consultation/collaboration.  Nicoletta Dress. Colon Branch, MD 03/01/11 [phone]

## 2011-03-01 NOTE — ED Provider Notes (Signed)
Medical screening examination/treatment/procedure(s) were performed by non-physician practitioner and as supervising physician I was immediately available for consultation/collaboration.   Shelda Jakes, MD 03/01/11 [phone]

## 2011-04-09 ENCOUNTER — Encounter (HOSPITAL_COMMUNITY): Payer: Self-pay | Admitting: *Deleted

## 2011-04-09 ENCOUNTER — Other Ambulatory Visit: Payer: Self-pay

## 2011-04-09 ENCOUNTER — Emergency Department (HOSPITAL_COMMUNITY)
Admission: EM | Admit: 2011-04-09 | Discharge: 2011-04-09 | Disposition: A | Discharge status: Home / Self Care | Payer: Medicaid Other | Attending: Emergency Medicine | Admitting: Emergency Medicine

## 2011-04-09 DIAGNOSIS — R55 Syncope and collapse: Secondary | ICD-10-CM | POA: Insufficient documentation

## 2011-04-09 DIAGNOSIS — R404 Transient alteration of awareness: Secondary | ICD-10-CM | POA: Insufficient documentation

## 2011-04-09 DIAGNOSIS — M25469 Effusion, unspecified knee: Secondary | ICD-10-CM | POA: Insufficient documentation

## 2011-04-09 DIAGNOSIS — Z79899 Other long term (current) drug therapy: Secondary | ICD-10-CM | POA: Insufficient documentation

## 2011-04-09 DIAGNOSIS — I10 Essential (primary) hypertension: Secondary | ICD-10-CM | POA: Insufficient documentation

## 2011-04-09 DIAGNOSIS — M069 Rheumatoid arthritis, unspecified: Secondary | ICD-10-CM | POA: Insufficient documentation

## 2011-04-09 DIAGNOSIS — M7989 Other specified soft tissue disorders: Secondary | ICD-10-CM | POA: Insufficient documentation

## 2011-04-09 DIAGNOSIS — R112 Nausea with vomiting, unspecified: Secondary | ICD-10-CM | POA: Insufficient documentation

## 2011-04-09 MED ORDER — MECLIZINE HCL 12.5 MG PO TABS
25.0000 mg | ORAL_TABLET | Freq: Once | ORAL | Status: AC
  Administered 2011-04-09: 25 mg via ORAL
  Filled 2011-04-09: qty 2

## 2011-04-09 MED ORDER — ONDANSETRON 8 MG PO TBDP
8.0000 mg | ORAL_TABLET | Freq: Once | ORAL | Status: AC
  Administered 2011-04-09: 8 mg via ORAL
  Filled 2011-04-09: qty 1

## 2011-04-09 NOTE — ED Notes (Signed)
Walking to car and had syncopal episode, says she felt 'light  Headed".  NVD  X 24 hours,  Swelling of feet and rt knee.

## 2011-04-09 NOTE — ED Notes (Signed)
MD at bedside. 

## 2011-04-09 NOTE — ED Notes (Signed)
Pt tolerated fluid challenge, no nausea reported, dizzy on ambulating. MD aware.

## 2011-04-09 NOTE — ED Provider Notes (Signed)
History   This chart was scribed for Joya Gaskins, MD by Brooks Sailors. The patient was seen in room APA07/APA07. Patient's care was started at 1056.   CSN: [id number]  Arrival date & time 04/09/11  1056   First MD Initiated Contact with Patient 04/09/11 1136      Chief Complaint  Patient presents with  . Loss of Consciousness     HPI  [name] is a 46 y.o. female who presents to the Emergency Department complaining of an moderate episode of syncope this morning which lasted for at least one minute. Patient also notes she has been experiencing moderate to severe nausea and vomiting the past several days which is relieved by rest and lying flat. Patient reports she has had recent contact with coworkers diagnosed with Norovirus. Patient also c/o swelling in bilateral feet and right knee but notes she recently stopped taking rheumatoid arthritis medication.. Denies h/o CAD, denies chest pain, neck pain, and head pain, diarrhea, seizures, or  SOB. Patient with h/io HTN, anemia, rheumatoid arthritis.  No trauma reported from syncopal event Now improved at this time Past Medical History  Diagnosis Date  . Rheumatoid arthritis   . Chronic anemia   . Hypertension     Past Surgical History  Procedure Date  . Tubal ligation   . Endometrial ablation   . Breast cyst excision 12/31/2010    Procedure: CYST EXCISION BREAST;  Surgeon: Fabio Bering, MD;  Location: [facility];  Service: General;  Laterality: Right;  Excision Sebaceous Cyst Right Breast    Family History  Problem Relation Age of Onset  . Anesthesia problems Neg Hx     History  Substance Use Topics  . Smoking status: Current Everyday Smoker -- 0.5 packs/day    Types: Cigarettes  . Smokeless tobacco: Not on file  . Alcohol Use: No     occassional    OB History    Grav Para Term Preterm Abortions TAB SAB Ect Mult Living                  Review of Systems A complete 10 system review of systems was obtained  and all systems are negative except as noted in the HPI and PMH.   Allergies  Review of patient's allergies indicates no known allergies.  Home Medications   Current Outpatient Rx  Name Route Sig Dispense Refill  . CALCIUM 600 + D PO Oral Take 1 tablet by mouth daily.      Marland Kitchen FERROUS SULFATE 325 (65 FE) MG PO TABS Oral Take 325 mg by mouth 2 (two) times daily. For anemia     . FOLIC ACID 1 MG PO TABS Oral Take 1 mg by mouth daily.      Marland Kitchen HYDROXYCHLOROQUINE SULFATE 200 MG PO TABS Oral Take 400 mg by mouth daily.     Marland Kitchen METHOTREXATE 2.5 MG PO TABS Oral Take 12.5 mg by mouth once a week. Take 5 tablets every Tuesday and Wednesday Caution:Chemotherapy. Protect from light.    Marland Kitchen PREDNISONE 5 MG PO TABS Oral Take 5 mg by mouth daily.      BP 135/97  Pulse 93  Temp(Src) 98 F (36.7 C) (Oral)  Resp 17  Ht 5\' 6"  (1.676 m)  Wt 140 lb (63.504 kg)  BMI 22.60 kg/m2  SpO2 100%  Physical Exam CONSTITUTIONAL: Well developed/well nourished HEAD AND FACE: Normocephalic/atraumatic EYES: EOMI/PERRL ENMT: Mucous membranes moist NECK: supple no meningeal signs SPINE:entire spine nontender,  c-spine non tender, No bruising/crepitance/stepoffs noted to spine CV: S1/S2 noted, no murmurs/rubs/gallops noted LUNGS: Lungs are clear to auscultation bilaterally, no apparent distress ABDOMEN: soft, nontender, no rebound or guarding NEURO: Pt is awake/alert, moves all extremitiesx4 EXTREMITIES: pulses normal, full ROM. No edema noted.  No calf tenderness.  No joint effusion noted SKIN: warm, color normal PSYCH: no abnormalities of mood noted   ED Course  Procedures  DIAGNOSTIC STUDIES: Oxygen Saturation is 100% on room air, normal by my interpretation.    COORDINATION OF CARE: 12:01PM - Patient informed of current plan for treatment and evaluation and agrees with plan at this time.   Pt improved ambulatory at this time, steady gait, no focal neuro deficits I doubt acute neurologic/cardiovascular  process She is low risk for serious cardiac event  The patient appears reasonably screened and/or stabilized for discharge and I doubt any other medical condition or other [facility] requiring further screening, evaluation, or treatment in the ED at this time prior to discharge.       MDM  Nursing notes reviewed and considered in documentation     Date: 04/09/2011  Rate: 85  Rhythm: normal sinus rhythm  QRS Axis: normal  Intervals: normal  ST/T Wave abnormalities: normal  Conduction Disutrbances:none     I personally performed the services described in this documentation, which was scribed in my presence. The recorded information has been reviewed and considered.      Joya Gaskins, MD 04/09/11 [phone]

## 2011-05-09 ENCOUNTER — Emergency Department (HOSPITAL_COMMUNITY)
Admission: EM | Admit: 2011-05-09 | Discharge: 2011-05-09 | Disposition: A | Discharge status: Home / Self Care | Payer: Medicaid Other | Attending: Emergency Medicine | Admitting: Emergency Medicine

## 2011-05-09 ENCOUNTER — Encounter (HOSPITAL_COMMUNITY): Payer: Self-pay | Admitting: *Deleted

## 2011-05-09 ENCOUNTER — Emergency Department (HOSPITAL_COMMUNITY): Payer: Medicaid Other

## 2011-05-09 DIAGNOSIS — R109 Unspecified abdominal pain: Secondary | ICD-10-CM | POA: Insufficient documentation

## 2011-05-09 DIAGNOSIS — M069 Rheumatoid arthritis, unspecified: Secondary | ICD-10-CM | POA: Insufficient documentation

## 2011-05-09 DIAGNOSIS — W19XXXA Unspecified fall, initial encounter: Secondary | ICD-10-CM

## 2011-05-09 DIAGNOSIS — Z79899 Other long term (current) drug therapy: Secondary | ICD-10-CM | POA: Insufficient documentation

## 2011-05-09 DIAGNOSIS — M25559 Pain in unspecified hip: Secondary | ICD-10-CM | POA: Insufficient documentation

## 2011-05-09 DIAGNOSIS — W1809XA Striking against other object with subsequent fall, initial encounter: Secondary | ICD-10-CM | POA: Insufficient documentation

## 2011-05-09 DIAGNOSIS — R079 Chest pain, unspecified: Secondary | ICD-10-CM | POA: Insufficient documentation

## 2011-05-09 DIAGNOSIS — I10 Essential (primary) hypertension: Secondary | ICD-10-CM | POA: Insufficient documentation

## 2011-05-09 DIAGNOSIS — T148XXA Other injury of unspecified body region, initial encounter: Secondary | ICD-10-CM | POA: Insufficient documentation

## 2011-05-09 DIAGNOSIS — M79609 Pain in unspecified limb: Secondary | ICD-10-CM | POA: Insufficient documentation

## 2011-05-09 LAB — CREATININE, SERUM
Creatinine, Ser: 0.67 mg/dL (ref 0.50–1.10)
GFR calc Af Amer: >90 mL/min (ref >90)

## 2011-05-09 LAB — BUN: BUN: 10 mg/dL (ref 6–23)

## 2011-05-09 MED ORDER — HYDROCODONE-ACETAMINOPHEN 5-325 MG PO TABS: 1.0000 | ORAL_TABLET | ORAL | Status: AC | PRN

## 2011-05-09 MED ORDER — KETOROLAC TROMETHAMINE 30 MG/ML IJ SOLN
30.0000 mg | Freq: Once | INTRAMUSCULAR | Status: AC
  Administered 2011-05-09: 30 mg via INTRAVENOUS
  Filled 2011-05-09: qty 1

## 2011-05-09 MED ORDER — ONDANSETRON HCL 4 MG/2ML IJ SOLN
4.0000 mg | Freq: Once | INTRAMUSCULAR | Status: AC
  Administered 2011-05-09: 4 mg via INTRAVENOUS
  Filled 2011-05-09: qty 2

## 2011-05-09 MED ORDER — IOHEXOL 300 MG/ML  SOLN
100.0000 mL | Freq: Once | INTRAMUSCULAR | Status: AC | PRN
  Administered 2011-05-09: 100 mL via INTRAVENOUS

## 2011-05-09 MED ORDER — IBUPROFEN 800 MG PO TABS: 800.0000 mg | ORAL_TABLET | Freq: Three times a day (TID) | ORAL | Status: AC

## 2011-05-09 MED ORDER — HYDROMORPHONE HCL PF 1 MG/ML IJ SOLN
1.0000 mg | Freq: Once | INTRAMUSCULAR | Status: AC
  Administered 2011-05-09: 1 mg via INTRAVENOUS
  Filled 2011-05-09: qty 1

## 2011-05-09 MED ORDER — SODIUM CHLORIDE 0.9 % IV SOLN
Freq: Once | INTRAVENOUS | Status: AC
  Administered 2011-05-09: 17:00:00 via INTRAVENOUS

## 2011-05-09 NOTE — ED Notes (Signed)
Reports was attempting to remove a refrigerator off back of truck when it fell on top of her, landing on her abd.  C/o increased pain.

## 2011-05-09 NOTE — Discharge Instructions (Signed)
The CT scans were all normal with no injuries. An incidental finding on the chest CT shows a small nodule. Since you are a smoker, a repeat CT should be done in 6 months for follow up.Apply heat or ice for comfort. Use the medicines as directed.

## 2011-05-09 NOTE — ED Provider Notes (Signed)
This chart was scribed for [employer]. Colon Branch, MD by Wallis Mart. The patient was seen in room APA11/APA11 and the patient's care was started at 4:13 PM.  , CSN: [id number]  Arrival date & time 05/09/11  1454   First MD Initiated Contact with Patient 05/09/11 1527      Chief Complaint  Patient presents with  . Abdominal Pain    (Consider location/radiation/quality/duration/timing/severity/associated sxs/prior treatment) HPI   [name] is a 46 y.o. female who presents to the Emergency Department complaining of sudden onset, persistence of constant, gradually worsening, moderate to severe abdominal pain onset two hours ago.  The pain radiates nowhere.  Pt states that she was attempting to move a refrigerator off of a truck and that the refridgerator fell on top of her, landing on her abdomen. Pt was able to free herself from below the refridgerator.  There are no other associated symptoms and no other alleviating or aggravating factors.   Past Medical History  Diagnosis Date  . Rheumatoid arthritis   . Chronic anemia   . Hypertension     Past Surgical History  Procedure Date  . Tubal ligation   . Endometrial ablation   . Breast cyst excision 12/31/2010    Procedure: CYST EXCISION BREAST;  Surgeon: Fabio Bering, MD;  Location: [facility];  Service: General;  Laterality: Right;  Excision Sebaceous Cyst Right Breast    Family History  Problem Relation Age of Onset  . Anesthesia problems Neg Hx     History  Substance Use Topics  . Smoking status: Current Everyday Smoker -- 0.5 packs/day    Types: Cigarettes  . Smokeless tobacco: Not on file  . Alcohol Use: No     occassional    OB History    Grav Para Term Preterm Abortions TAB SAB Ect Mult Living                  Review of Systems 10 Systems reviewed and all are negative for acute change except as noted in the HPI.   Allergies  Review of patient's allergies indicates no known allergies.  Home Medications    Current Outpatient Rx  Name Route Sig Dispense Refill  . CALCIUM 600 + D PO Oral Take 1 tablet by mouth daily.      Marland Kitchen FERROUS SULFATE 325 (65 FE) MG PO TABS Oral Take 325 mg by mouth 2 (two) times daily. For anemia     . FOLIC ACID 1 MG PO TABS Oral Take 1 mg by mouth daily.      Marland Kitchen HYDROXYCHLOROQUINE SULFATE 200 MG PO TABS Oral Take 400 mg by mouth daily.     Marland Kitchen METHOTREXATE 2.5 MG PO TABS Oral Take 12.5 mg by mouth once a week. Take 5 tablets every Tuesday and Thursday Caution:Chemotherapy. Protect from light.      BP 123/89  Pulse 78  Temp(Src) 97.6 F (36.4 C) (Oral)  Resp 20  Ht 5\' 6"  (1.676 m)  Wt 141 lb (63.957 kg)  BMI 22.76 kg/m2  SpO2 99%  Physical Exam  Nursing note and vitals reviewed. Constitutional: She is oriented to person, place, and time. She appears well-developed and well-nourished. No distress.  HENT:  Head: Normocephalic and atraumatic.  Eyes: EOM are normal. Pupils are equal, round, and reactive to light.  Neck: Normal range of motion. Neck supple. No tracheal deviation present.  Cardiovascular: Normal rate and normal heart sounds.   Pulmonary/Chest: Effort normal and breath sounds normal. No  respiratory distress.       right sided chest with sternal pain  Abdominal: Soft. Bowel sounds are normal. She exhibits no distension. There is tenderness.       Tenderness over right lower ribcage, right upper abdomin to below umbilicus , right lower abd to hip bone, right arm tender to palpation, pulses preset, no bruising or lesions, no deformity  Musculoskeletal: Normal range of motion. She exhibits no edema.  Neurological: She is alert and oriented to person, place, and time. No sensory deficit.  Skin: Skin is warm and dry.       No visible bruises  Psychiatric: She has a normal mood and affect. Her behavior is normal.    ED Course  Procedures (including critical care time) DIAGNOSTIC STUDIES: Oxygen Saturation is 99% on room air, normal by my  interpretation.    COORDINATION OF CARE:  5:43 PM: EDP at bedside. All results reviewed and discussed with pt, questions answered, pt agreeable with plan.     Labs Reviewed  BUN  CREATININE, SERUM   Ct Chest W Contrast  05/09/2011  *RADIOLOGY REPORT*  Clinical Data:  Trauma.  CT CHEST, ABDOMEN AND PELVIS WITH CONTRAST  Technique:  Multidetector CT imaging of the chest, abdomen and pelvis was performed following the standard protocol during bolus administration of intravenous contrast.  Contrast: OMNIPAQUE IOHEXOL 300 MG/ML  SOLN  Comparison:   None.  CT CHEST  Findings:  There is no supraclavicular or axillary adenopathy.  There is no mediastinal or hilar adenopathy.  No pericardial or pleural effusion.  The tracheobronchial tree appears normal.  Calcifications within the LAD coronary artery noted.  No evidence for pulmonary contusion or pneumothorax identified.  Pulmonary nodule in the right middle lobe is identified measuring 5 mm, image number 38.  IMPRESSION:  1.  No acute cardiopulmonary abnormalities. 2.  Right middle lobe pulmonary nodule measures 5 mm. If the patient is at high risk for bronchogenic carcinoma, follow-up chest CT at 6-12 months is recommended.  If the patient is at low risk for bronchogenic carcinoma, follow-up chest CT at 12 months is recommended.  This recommendation follows the consensus statement: Guidelines for Management of Small Pulmonary Nodules Detected on CT Scans: A Statement from the Fleischner Society as published in Radiology 2005; 237:395-400.  CT ABDOMEN AND PELVIS  Findings:  There are no focal liver abnormalities.  The spleen is normal.  The adrenal glands are normal.  Normal appearance of the pancreas.  The gallbladder is normal.  No biliary dilatation.  Normal appearance of the right kidney.  The left kidney is also normal.  No upper abdominal adenopathy.  There is no pelvic or inguinal adenopathy.  Trace amount of low density fluid is noted within the  dependent portion of the pelvis.  The uterus and adnexal structures have a normal physiologic appearance for patient's age.  The urinary bladder appears normal.  The bowel loops within the abdomen and pelvis appear normal.  Review of the visualized osseous structures shows no acute bony abnormalities.  IMPRESSION:  1.  No acute findings.  Original Report Authenticated By: Rosealee Albee, M.D.   Ct Abdomen Pelvis W Contrast  05/09/2011  *RADIOLOGY REPORT*  Clinical Data:  Trauma.  CT CHEST, ABDOMEN AND PELVIS WITH CONTRAST  Technique:  Multidetector CT imaging of the chest, abdomen and pelvis was performed following the standard protocol during bolus administration of intravenous contrast.  Contrast: OMNIPAQUE IOHEXOL 300 MG/ML  SOLN  Comparison:  None.  CT CHEST  Findings:  There is no supraclavicular or axillary adenopathy.  There is no mediastinal or hilar adenopathy.  No pericardial or pleural effusion.  The tracheobronchial tree appears normal.  Calcifications within the LAD coronary artery noted.  No evidence for pulmonary contusion or pneumothorax identified.  Pulmonary nodule in the right middle lobe is identified measuring 5 mm, image number 38.  IMPRESSION:  1.  No acute cardiopulmonary abnormalities. 2.  Right middle lobe pulmonary nodule measures 5 mm. If the patient is at high risk for bronchogenic carcinoma, follow-up chest CT at 6-12 months is recommended.  If the patient is at low risk for bronchogenic carcinoma, follow-up chest CT at 12 months is recommended.  This recommendation follows the consensus statement: Guidelines for Management of Small Pulmonary Nodules Detected on CT Scans: A Statement from the Fleischner Society as published in Radiology 2005; 237:395-400.  CT ABDOMEN AND PELVIS  Findings:  There are no focal liver abnormalities.  The spleen is normal.  The adrenal glands are normal.  Normal appearance of the pancreas.  The gallbladder is normal.  No biliary dilatation.   Normal appearance of the right kidney.  The left kidney is also normal.  No upper abdominal adenopathy.  There is no pelvic or inguinal adenopathy.  Trace amount of low density fluid is noted within the dependent portion of the pelvis.  The uterus and adnexal structures have a normal physiologic appearance for patient's age.  The urinary bladder appears normal.  The bowel loops within the abdomen and pelvis appear normal.  Review of the visualized osseous structures shows no acute bony abnormalities.  IMPRESSION:  1.  No acute findings.  Original Report Authenticated By: Rosealee Albee, M.D.        MDM  Patient presents after having a refrigerator fall on her as she was trying to get off a pickup truck with hand truck. CT scans of chest, abdomen and pelvis are without acute findings. Given analgesics, antiinflammatory and antiemetic. Pt stable in ED with no significant deterioration in condition.The patient appears reasonably screened and/or stabilized for discharge and I doubt any other medical condition or other [facility] requiring further screening, evaluation, or treatment in the ED at this time prior to discharge.  I personally performed the services described in this documentation, which was scribed in my presence. The recorded information has been reviewed and considered.   MDM Reviewed: nursing note and vitals Interpretation: CT scan         Nicoletta Dress. Colon Branch, MD 05/09/11 1752

## 2011-05-21 ENCOUNTER — Encounter (HOSPITAL_COMMUNITY): Payer: Self-pay

## 2011-05-21 ENCOUNTER — Emergency Department (HOSPITAL_COMMUNITY): Payer: Medicaid Other

## 2011-05-21 ENCOUNTER — Emergency Department (HOSPITAL_COMMUNITY)
Admission: EM | Admit: 2011-05-21 | Discharge: 2011-05-22 | Disposition: A | Discharge status: Home / Self Care | Payer: Medicaid Other | Attending: Emergency Medicine | Admitting: Emergency Medicine

## 2011-05-21 DIAGNOSIS — R319 Hematuria, unspecified: Secondary | ICD-10-CM | POA: Insufficient documentation

## 2011-05-21 DIAGNOSIS — M25539 Pain in unspecified wrist: Secondary | ICD-10-CM | POA: Insufficient documentation

## 2011-05-21 DIAGNOSIS — R109 Unspecified abdominal pain: Secondary | ICD-10-CM | POA: Insufficient documentation

## 2011-05-21 DIAGNOSIS — IMO0001 Reserved for inherently not codable concepts without codable children: Secondary | ICD-10-CM | POA: Insufficient documentation

## 2011-05-21 DIAGNOSIS — Z79899 Other long term (current) drug therapy: Secondary | ICD-10-CM | POA: Insufficient documentation

## 2011-05-21 DIAGNOSIS — S0990XA Unspecified injury of head, initial encounter: Secondary | ICD-10-CM | POA: Insufficient documentation

## 2011-05-21 DIAGNOSIS — M79609 Pain in unspecified limb: Secondary | ICD-10-CM | POA: Insufficient documentation

## 2011-05-21 DIAGNOSIS — R0789 Other chest pain: Secondary | ICD-10-CM

## 2011-05-21 DIAGNOSIS — S63509A Unspecified sprain of unspecified wrist, initial encounter: Secondary | ICD-10-CM | POA: Insufficient documentation

## 2011-05-21 DIAGNOSIS — M549 Dorsalgia, unspecified: Secondary | ICD-10-CM | POA: Insufficient documentation

## 2011-05-21 DIAGNOSIS — S5010XA Contusion of unspecified forearm, initial encounter: Secondary | ICD-10-CM | POA: Insufficient documentation

## 2011-05-21 DIAGNOSIS — R071 Chest pain on breathing: Secondary | ICD-10-CM | POA: Insufficient documentation

## 2011-05-21 DIAGNOSIS — R0602 Shortness of breath: Secondary | ICD-10-CM | POA: Insufficient documentation

## 2011-05-21 DIAGNOSIS — M542 Cervicalgia: Secondary | ICD-10-CM | POA: Insufficient documentation

## 2011-05-21 DIAGNOSIS — M069 Rheumatoid arthritis, unspecified: Secondary | ICD-10-CM | POA: Insufficient documentation

## 2011-05-21 DIAGNOSIS — M25439 Effusion, unspecified wrist: Secondary | ICD-10-CM | POA: Insufficient documentation

## 2011-05-21 DIAGNOSIS — S91109A Unspecified open wound of unspecified toe(s) without damage to nail, initial encounter: Secondary | ICD-10-CM | POA: Insufficient documentation

## 2011-05-21 DIAGNOSIS — I10 Essential (primary) hypertension: Secondary | ICD-10-CM | POA: Insufficient documentation

## 2011-05-21 LAB — BASIC METABOLIC PANEL
BUN: 10 mg/dL (ref 6–23)
Chloride: 102 mEq/L (ref 96–112)
GFR calc Af Amer: >90 mL/min (ref >90)
GFR calc non Af Amer: >90 mL/min (ref >90)
Potassium: 3 mEq/L — ABNORMAL LOW (ref 3.5–5.1)
Sodium: 138 mEq/L (ref 135–145)

## 2011-05-21 LAB — URINE MICROSCOPIC-ADD ON

## 2011-05-21 LAB — URINALYSIS, ROUTINE W REFLEX MICROSCOPIC
Leukocytes, UA: NEGATIVE
Nitrite: NEGATIVE
Specific Gravity, Urine: >1.03 — ABNORMAL HIGH (ref 1.005–1.030)
Urobilinogen, UA: 0.2 mg/dL (ref 0.0–1.0)
pH: 5.5 (ref 5.0–8.0)

## 2011-05-21 LAB — CBC
Hemoglobin: 14.8 g/dL (ref 12.0–15.0)
MCH: 32.9 pg (ref 26.0–34.0)
MCHC: 35.4 g/dL (ref 30.0–36.0)
Platelets: 338 10*3/uL (ref 150–400)
RBC: 4.5 MIL/uL (ref 3.87–5.11)

## 2011-05-21 MED ORDER — SODIUM CHLORIDE 0.9 % IV SOLN
INTRAVENOUS | Status: DC
  Administered 2011-05-21: via INTRAVENOUS

## 2011-05-21 MED ORDER — IOHEXOL 300 MG/ML  SOLN
100.0000 mL | Freq: Once | INTRAMUSCULAR | Status: AC | PRN
  Administered 2011-05-21: 100 mL via INTRAVENOUS

## 2011-05-21 MED ORDER — ONDANSETRON HCL 4 MG/2ML IJ SOLN
4.0000 mg | Freq: Once | INTRAMUSCULAR | Status: AC
  Administered 2011-05-21: 4 mg via INTRAVENOUS
  Filled 2011-05-21: qty 2

## 2011-05-21 MED ORDER — SODIUM CHLORIDE 0.9 % IV BOLUS (SEPSIS)
250.0000 mL | Freq: Once | INTRAVENOUS | Status: AC
  Administered 2011-05-21: 250 mL via INTRAVENOUS

## 2011-05-21 MED ORDER — HYDROMORPHONE HCL PF 1 MG/ML IJ SOLN
1.0000 mg | Freq: Once | INTRAMUSCULAR | Status: AC
  Administered 2011-05-22: 1 mg via INTRAVENOUS
  Filled 2011-05-21: qty 1

## 2011-05-21 MED ORDER — HYDROMORPHONE HCL PF 1 MG/ML IJ SOLN
1.0000 mg | Freq: Once | INTRAMUSCULAR | Status: AC
  Administered 2011-05-21: 1 mg via INTRAVENOUS
  Filled 2011-05-21: qty 1

## 2011-05-21 MED ORDER — POTASSIUM CHLORIDE CRYS ER 20 MEQ PO TBCR
40.0000 meq | EXTENDED_RELEASE_TABLET | Freq: Once | ORAL | Status: AC
  Administered 2011-05-21: 40 meq via ORAL
  Filled 2011-05-21: qty 2

## 2011-05-21 MED ORDER — DEXTROSE 5 % IV SOLN
1.0000 g | Freq: Once | INTRAVENOUS | Status: AC
  Administered 2011-05-21: 1 g via INTRAVENOUS
  Filled 2011-05-21: qty 10
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
## 2011-05-21 NOTE — ED Notes (Signed)
Patient states that she needs something for pain and nerves, dr. Saul Fordyce notified.
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
## 2011-05-22 MED ORDER — HYDROCODONE-ACETAMINOPHEN 5-325 MG PO TABS: 1.0000 | ORAL_TABLET | Freq: Four times a day (QID) | ORAL | Status: AC | PRN

## 2011-05-22 MED ORDER — CEPHALEXIN 500 MG PO CAPS: 500.0000 mg | ORAL_CAPSULE | Freq: Four times a day (QID) | ORAL | Status: AC

## 2011-05-22 MED ORDER — CEPHALEXIN 500 MG PO CAPS: 500.0000 mg | ORAL_CAPSULE | Freq: Four times a day (QID) | ORAL | Status: DC

## 2011-05-22 MED ORDER — NAPROXEN 500 MG PO TABS: 500.0000 mg | ORAL_TABLET | Freq: Two times a day (BID) | ORAL | Status: DC
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
## 2011-09-22 ENCOUNTER — Encounter (HOSPITAL_COMMUNITY): Payer: Self-pay | Admitting: *Deleted

## 2011-09-22 ENCOUNTER — Emergency Department (HOSPITAL_COMMUNITY): Payer: Medicaid Other

## 2011-09-22 ENCOUNTER — Emergency Department (HOSPITAL_COMMUNITY)
Admission: EM | Admit: 2011-09-22 | Discharge: 2011-09-22 | Disposition: A | Discharge status: Home / Self Care | Payer: Medicaid Other | Attending: Emergency Medicine | Admitting: Emergency Medicine

## 2011-09-22 DIAGNOSIS — T07XXXA Unspecified multiple injuries, initial encounter: Secondary | ICD-10-CM

## 2011-09-22 DIAGNOSIS — M542 Cervicalgia: Secondary | ICD-10-CM | POA: Insufficient documentation

## 2011-09-22 DIAGNOSIS — M069 Rheumatoid arthritis, unspecified: Secondary | ICD-10-CM | POA: Insufficient documentation

## 2011-09-22 DIAGNOSIS — S161XXA Strain of muscle, fascia and tendon at neck level, initial encounter: Secondary | ICD-10-CM

## 2011-09-22 DIAGNOSIS — F17200 Nicotine dependence, unspecified, uncomplicated: Secondary | ICD-10-CM | POA: Insufficient documentation

## 2011-09-22 DIAGNOSIS — S139XXA Sprain of joints and ligaments of unspecified parts of neck, initial encounter: Secondary | ICD-10-CM | POA: Insufficient documentation

## 2011-09-22 DIAGNOSIS — R51 Headache: Secondary | ICD-10-CM | POA: Insufficient documentation

## 2011-09-22 DIAGNOSIS — I10 Essential (primary) hypertension: Secondary | ICD-10-CM | POA: Insufficient documentation

## 2011-09-22 DIAGNOSIS — D649 Anemia, unspecified: Secondary | ICD-10-CM | POA: Insufficient documentation

## 2011-09-22 LAB — POCT I-STAT, CHEM 8
BUN: 11 mg/dL (ref 6–23)
Calcium, Ion: 1.2 mmol/L (ref 1.12–1.23)
Chloride: 105 mEq/L (ref 96–112)
Creatinine, Ser: 1.1 mg/dL (ref 0.50–1.10)
Glucose, Bld: 113 mg/dL — ABNORMAL HIGH (ref 70–99)
HCT: 40 % (ref 36.0–46.0)
Hemoglobin: 13.6 g/dL (ref 12.0–15.0)
Potassium: 3.6 mEq/L (ref 3.5–5.1)
Sodium: 140 mEq/L (ref 135–145)
TCO2: 22 mmol/L (ref 0–100)

## 2011-09-22 MED ORDER — NAPROXEN 500 MG PO TABS: 500.0000 mg | ORAL_TABLET | Freq: Two times a day (BID) | ORAL | Status: DC | PRN

## 2011-09-22 MED ORDER — HYDROMORPHONE HCL PF 1 MG/ML IJ SOLN
1.0000 mg | Freq: Once | INTRAMUSCULAR | Status: AC
  Administered 2011-09-22: 1 mg via INTRAVENOUS
  Filled 2011-09-22: qty 1

## 2011-09-22 MED ORDER — SODIUM CHLORIDE 0.9 % IV BOLUS (SEPSIS)
1000.0000 mL | Freq: Once | INTRAVENOUS | Status: AC
  Administered 2011-09-22: 1000 mL via INTRAVENOUS

## 2011-09-22 MED ORDER — IOHEXOL 300 MG/ML  SOLN
100.0000 mL | Freq: Once | INTRAMUSCULAR | Status: AC | PRN
  Administered 2011-09-22: 100 mL via INTRAVENOUS

## 2011-09-22 MED ORDER — ONDANSETRON HCL 4 MG/2ML IJ SOLN
4.0000 mg | Freq: Once | INTRAMUSCULAR | Status: AC
  Administered 2011-09-22: 4 mg via INTRAVENOUS
  Filled 2011-09-22: qty 2
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
## 2011-09-22 NOTE — ED Notes (Signed)
Reports increased pain after returning from CT.  Requesting medications for pain.
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
## 2011-09-22 NOTE — ED Notes (Signed)
Left in c/o family for transport home; alert, oriented x 4; in no distress.  Instructions reviewed and f/u information provided.  Verbalizes understanding.

## 2011-10-21 ENCOUNTER — Emergency Department (HOSPITAL_COMMUNITY)
Admission: EM | Admit: 2011-10-21 | Discharge: 2011-10-21 | Disposition: A | Discharge status: Home / Self Care | Payer: Medicaid Other | Attending: Emergency Medicine | Admitting: Emergency Medicine

## 2011-10-21 ENCOUNTER — Encounter (HOSPITAL_COMMUNITY): Payer: Self-pay | Admitting: Emergency Medicine

## 2011-10-21 DIAGNOSIS — M549 Dorsalgia, unspecified: Secondary | ICD-10-CM | POA: Insufficient documentation

## 2011-10-21 DIAGNOSIS — F329 Major depressive disorder, single episode, unspecified: Secondary | ICD-10-CM | POA: Insufficient documentation

## 2011-10-21 DIAGNOSIS — F17200 Nicotine dependence, unspecified, uncomplicated: Secondary | ICD-10-CM | POA: Insufficient documentation

## 2011-10-21 DIAGNOSIS — R52 Pain, unspecified: Secondary | ICD-10-CM | POA: Insufficient documentation

## 2011-10-21 DIAGNOSIS — R911 Solitary pulmonary nodule: Secondary | ICD-10-CM | POA: Insufficient documentation

## 2011-10-21 DIAGNOSIS — I10 Essential (primary) hypertension: Secondary | ICD-10-CM | POA: Insufficient documentation

## 2011-10-21 DIAGNOSIS — F3289 Other specified depressive episodes: Secondary | ICD-10-CM | POA: Insufficient documentation

## 2011-10-21 DIAGNOSIS — D649 Anemia, unspecified: Secondary | ICD-10-CM | POA: Insufficient documentation

## 2011-10-21 DIAGNOSIS — M069 Rheumatoid arthritis, unspecified: Secondary | ICD-10-CM | POA: Insufficient documentation

## 2011-10-21 HISTORY — DX: Rheumatoid arthritis, unspecified: M06.9

## 2011-10-21 HISTORY — DX: Other chronic pain: G89.29

## 2011-10-21 HISTORY — DX: Depression, unspecified: F32.A

## 2011-10-21 HISTORY — DX: Major depressive disorder, single episode, unspecified: F32.9

## 2011-10-21 HISTORY — DX: Solitary pulmonary nodule: R91.1

## 2011-10-21 MED ORDER — OXYCODONE-ACETAMINOPHEN 5-325 MG PO TABS
2.0000 | ORAL_TABLET | Freq: Once | ORAL | Status: AC
  Administered 2011-10-21: 2 via ORAL
  Filled 2011-10-21: qty 2

## 2011-10-21 MED ORDER — HYDROCODONE-ACETAMINOPHEN 5-325 MG PO TABS: ORAL_TABLET | ORAL | Status: DC

## 2011-10-21 MED ORDER — PREDNISONE 10 MG PO TABS: ORAL_TABLET | ORAL | Status: DC

## 2011-10-21 NOTE — ED Notes (Signed)
Patient c/o pain and swelling at all joints. Per patient has rheumatoid arthritis that has progressively getting worse. Per patient pain so bad she was unable to work.

## 2011-10-21 NOTE — ED Notes (Signed)
Patient with no complaints at this time. Respirations even and unlabored. Skin warm/dry. Discharge instructions reviewed with patient at this time. Patient given opportunity to voice concerns/ask questions. Patient discharged at this time and left Emergency Department with steady gait.   

## 2011-10-21 NOTE — ED Provider Notes (Signed)
History     CSN: [id number]  Arrival date & time 10/21/11  1308   First MD Initiated Contact with Patient 10/21/11 1505      Chief Complaint  Patient presents with  . Generalized Body Aches     HPI Pt was seen at 1530.  Per pt, c/o gradual onset and persistence of constant bilat shoulders, wrists, hands, knees and ankles "pain" for the past 1 week. Endorses she has been taking her usual RA meds as previously prescribed.  Denies injury, no fevers, no rash, no swelling, no focal motor weakness, no tingling/numbness in extremities.    Rheum:  Dr. Kellie Simmering Past Medical History  Diagnosis Date  . Rheumatoid arthritis   . Chronic anemia   . Hypertension   . Arthritis, rheumatoid   . Incidental lung nodule   . Depression   . Chronic pain   . Chronic back pain     Past Surgical History  Procedure Date  . Tubal ligation   . Endometrial ablation   . Breast cyst excision 12/31/2010    Procedure: CYST EXCISION BREAST;  Surgeon: Fabio Bering, MD;  Location: [facility];  Service: General;  Laterality: Right;  Excision Sebaceous Cyst Right Breast    Family History  Problem Relation Age of Onset  . Anesthesia problems Neg Hx   . Cancer Father     History  Substance Use Topics  . Smoking status: Current Every Day Smoker -- 0.0 packs/day for 30 years    Types: Cigarettes  . Smokeless tobacco: Never Used  . Alcohol Use: No     occassional    OB History    Grav Para Term Preterm Abortions TAB SAB Ect Mult Living   6 5 5  1  1   5       Review of Systems ROS: Statement: All systems negative except as marked or noted in the HPI; Constitutional: Negative for fever and chills. ; ; Eyes: Negative for eye pain, redness and discharge. ; ; ENMT: Negative for ear pain, hoarseness, nasal congestion, sinus pressure and sore throat. ; ; Cardiovascular: Negative for chest pain, palpitations, diaphoresis, dyspnea and peripheral edema. ; ; Respiratory: Negative for cough, wheezing and stridor.  ; ; Gastrointestinal: Negative for nausea, vomiting, diarrhea, abdominal pain, blood in stool, hematemesis, jaundice and rectal bleeding. . ; ; Genitourinary: Negative for dysuria, flank pain and hematuria. ; ; Musculoskeletal: +joints pain. Negative for back pain and neck pain. Negative for swelling and trauma.; ; Skin: Negative for pruritus, rash, abrasions, blisters, bruising and skin lesion.; ; Neuro: Negative for headache, lightheadedness and neck stiffness. Negative for weakness, altered level of consciousness , altered mental status, extremity weakness, paresthesias, involuntary movement, seizure and syncope.      Allergies  Review of patient's allergies indicates no known allergies.  Home Medications   Current Outpatient Rx  Name Route Sig Dispense Refill  . CALCIUM 600 + D PO Oral Take 1 tablet by mouth every evening.     Marland Kitchen FERROUS SULFATE 325 (65 FE) MG PO TABS Oral Take 325 mg by mouth 2 (two) times daily. For anemia     . FOLIC ACID 1 MG PO TABS Oral Take 1 mg by mouth daily.      Marland Kitchen HYDROXYCHLOROQUINE SULFATE 200 MG PO TABS Oral Take 200 mg by mouth daily.    Marland Kitchen METHOTREXATE 2.5 MG PO TABS Oral Take 12.5 mg by mouth once a week. Take 5 tablets every Monday and Tuesday :Caution:Chemotherapy. Protect  from light.    . ADULT MULTIVITAMIN W/MINERALS CH Oral Take 1 tablet by mouth daily.      BP 123/72  Pulse 86  Temp 98.2 F (36.8 C) (Oral)  Resp 20  Ht 5\' 6"  (1.676 m)  Wt 153 lb (69.4 kg)  BMI 24.69 kg/m2  SpO2 100%  Physical Exam 1535: Physical examination:  Nursing notes reviewed; Vital signs and O2 SAT reviewed;  Constitutional: Well developed, Well nourished, Well hydrated, In no acute distress; Head:  Normocephalic, atraumatic; Eyes: EOMI, PERRL, No scleral icterus; ENMT: Mouth and pharynx normal, Mucous membranes moist; Neck: Supple, Full range of motion, No lymphadenopathy; Cardiovascular: Regular rate and rhythm, No murmur, rub, or gallop; Respiratory: Breath sounds  clear & equal bilaterally, No rales, rhonchi, wheezes.  Speaking full sentences with ease, Normal respiratory effort/excursion; Chest: Nontender, Movement normal; Abdomen: Soft, Nontender, Nondistended, Normal bowel sounds; Extremities: Pulses normal, No tenderness, No UE's or LE's joints edema bilat. No UE's or LE's joints erythema, warmth or deformity bilat. No calf edema or asymmetry.; Neuro: AA&Ox3, Major CN grossly intact.  Speech clear. No gross focal motor or sensory deficits in extremities. Climbs on and off stretcher by herself without distress.  Gait steady and upright.; Skin: Color normal, Warm, Dry, no rash.   ED Course  Procedures   MDM  MDM Reviewed: nursing note, vitals and previous chart       1620: T/C to Rheum Dr. Kellie Simmering, case discussed, including:  HPI, pertinent PM/SHx, VS/PE, dx testing, ED course and treatment:  He has not seen pt in office since January (pt no show for Feb, Mar, April appts), requests not to change plaquenil or methotrexate and to start prednisone taper for 2 weeks, encourage pt to keep her ofc appointments.  Dx testing d/w pt.  Questions answered.  Verb understanding, agreeable to d/c home with outpt f/u. States she has an appt with him in 3 weeks.       Laray Anger, DO 10/24/11 1016

## 2011-11-05 IMAGING — CR DG ELBOW COMPLETE 3+V*L*
3 series · 3 of 3 positions shown · non-contrast
Comparison: None.

CLINICAL DATA: Fall.  Elbow pain.

LEFT ELBOW - COMPLETE 3+ VIEW

[view not recorded (1 of 3)]
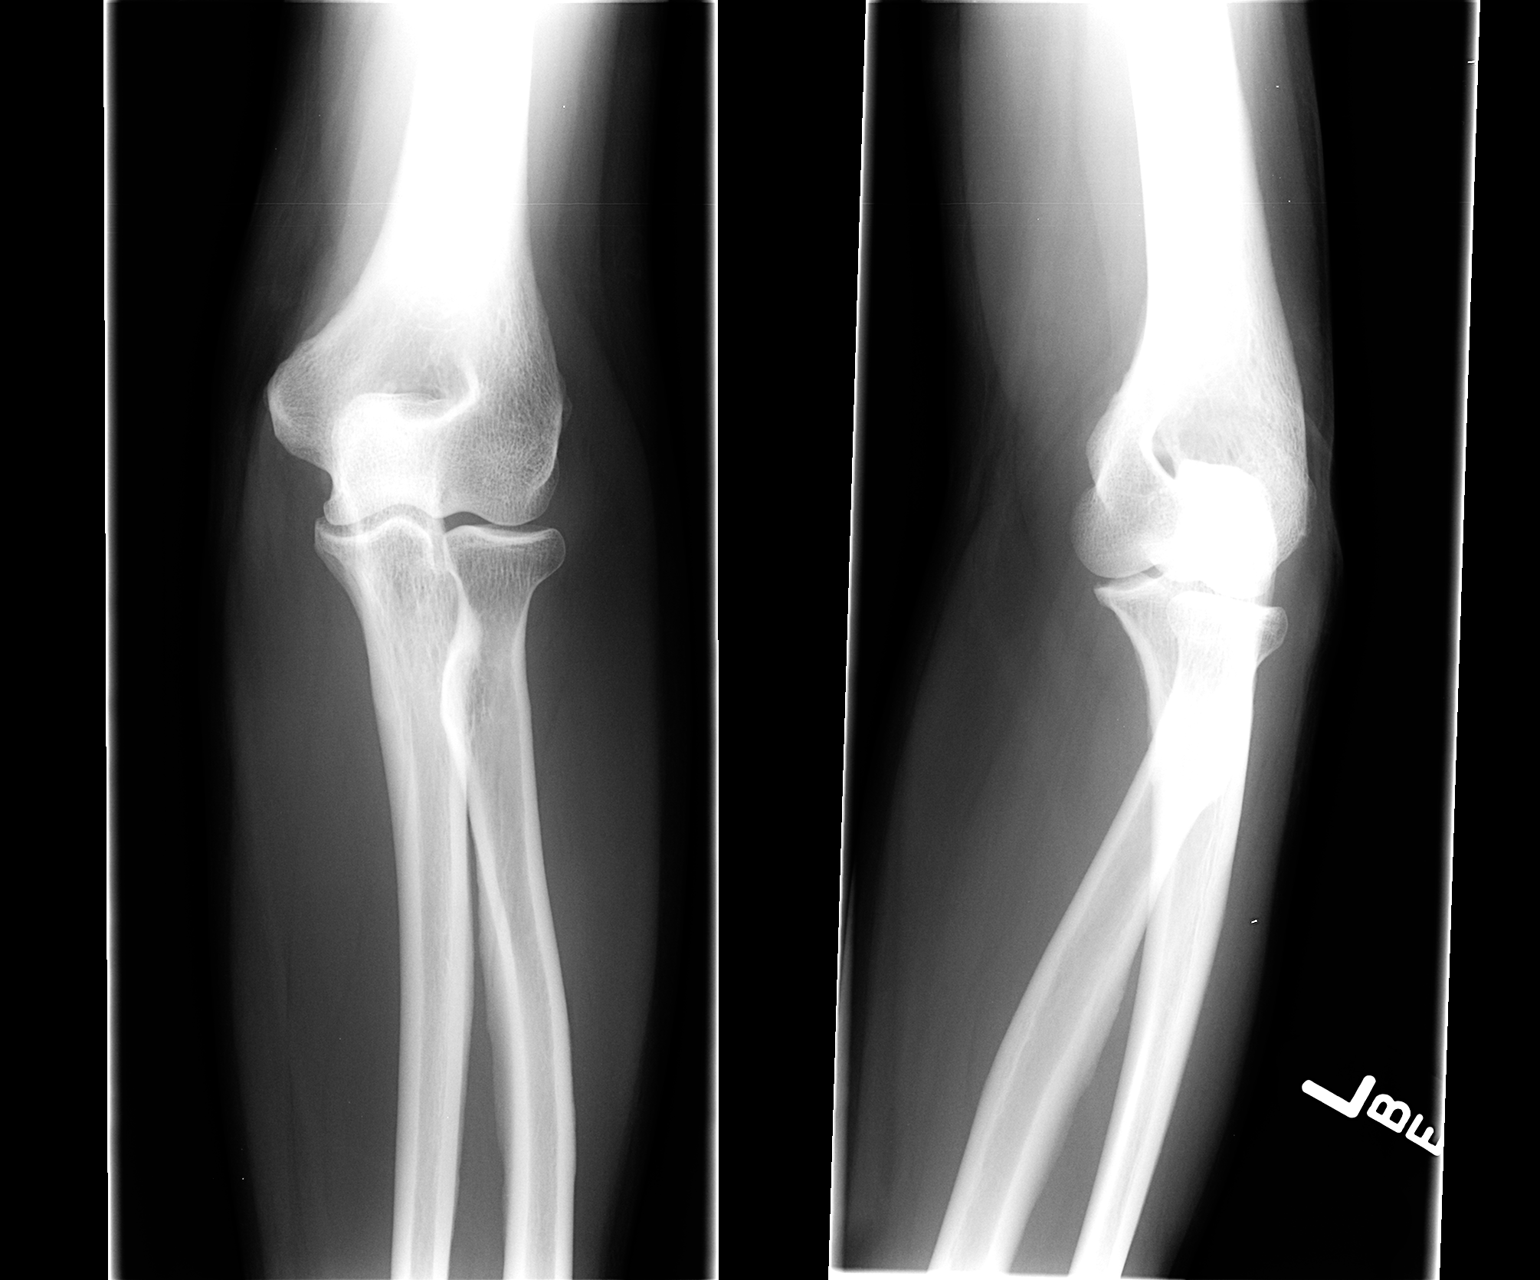

[view not recorded (2 of 3)]
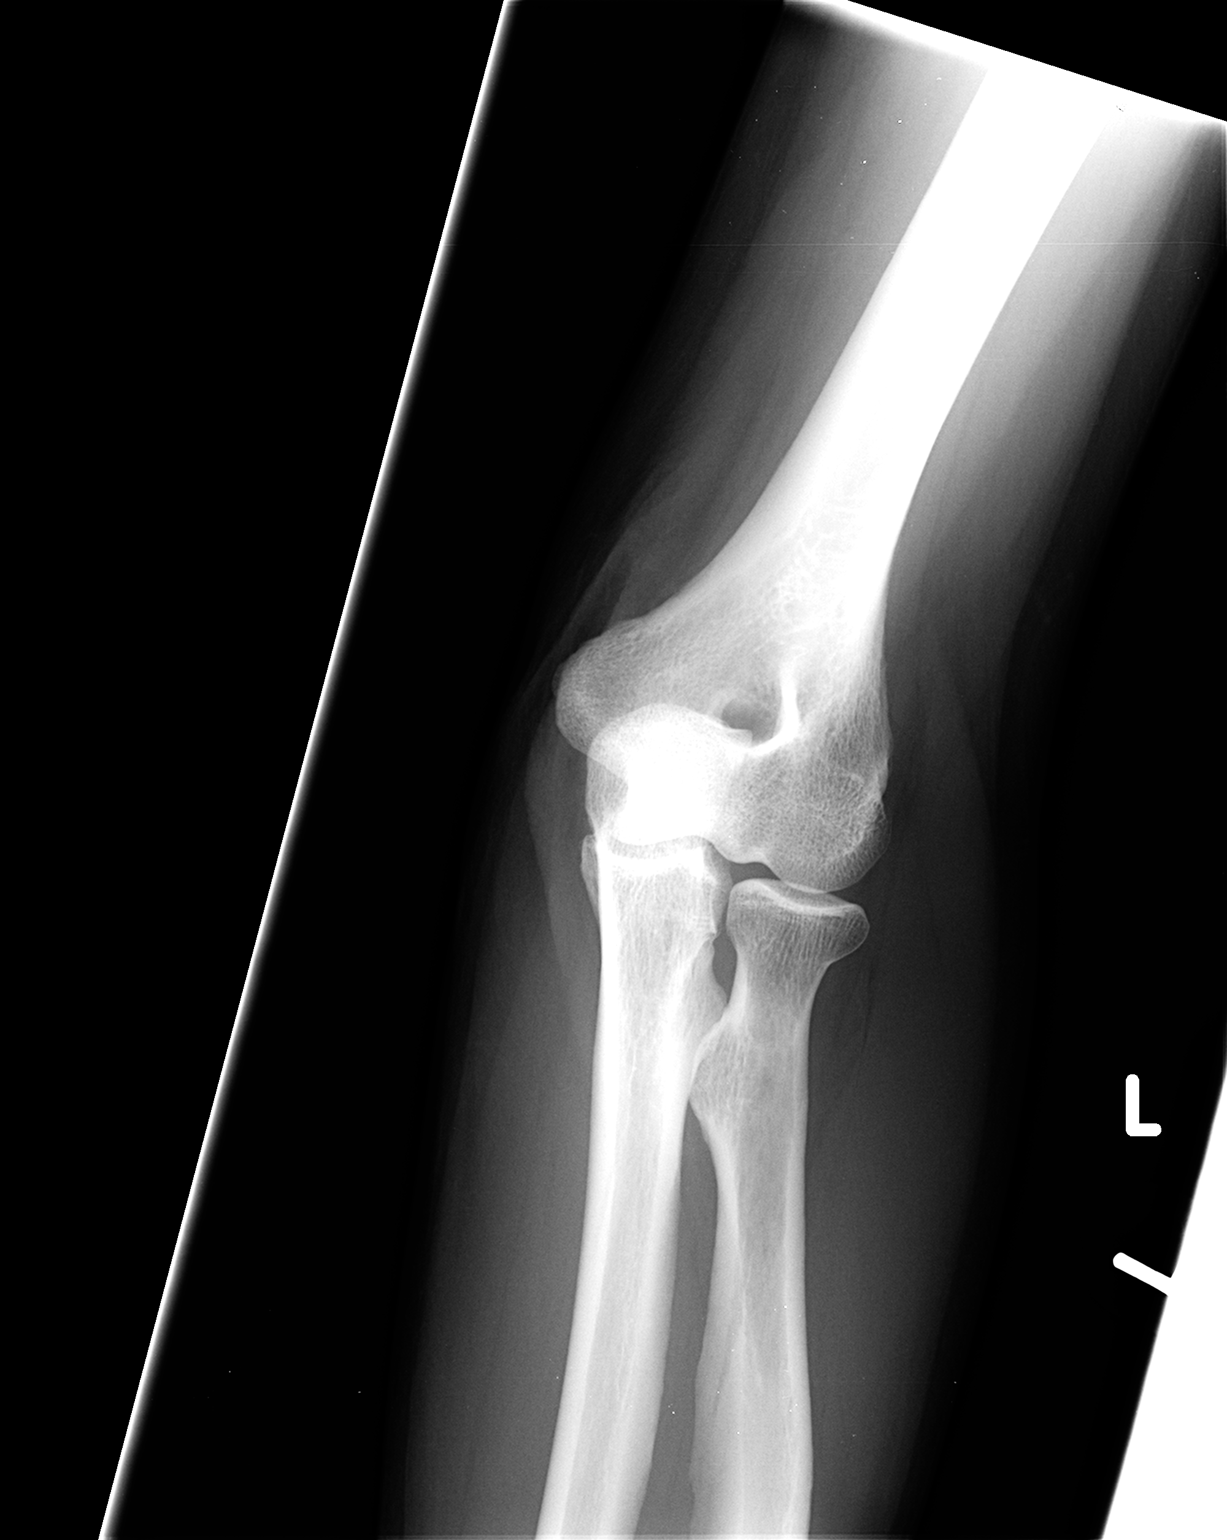

[view not recorded (3 of 3)]
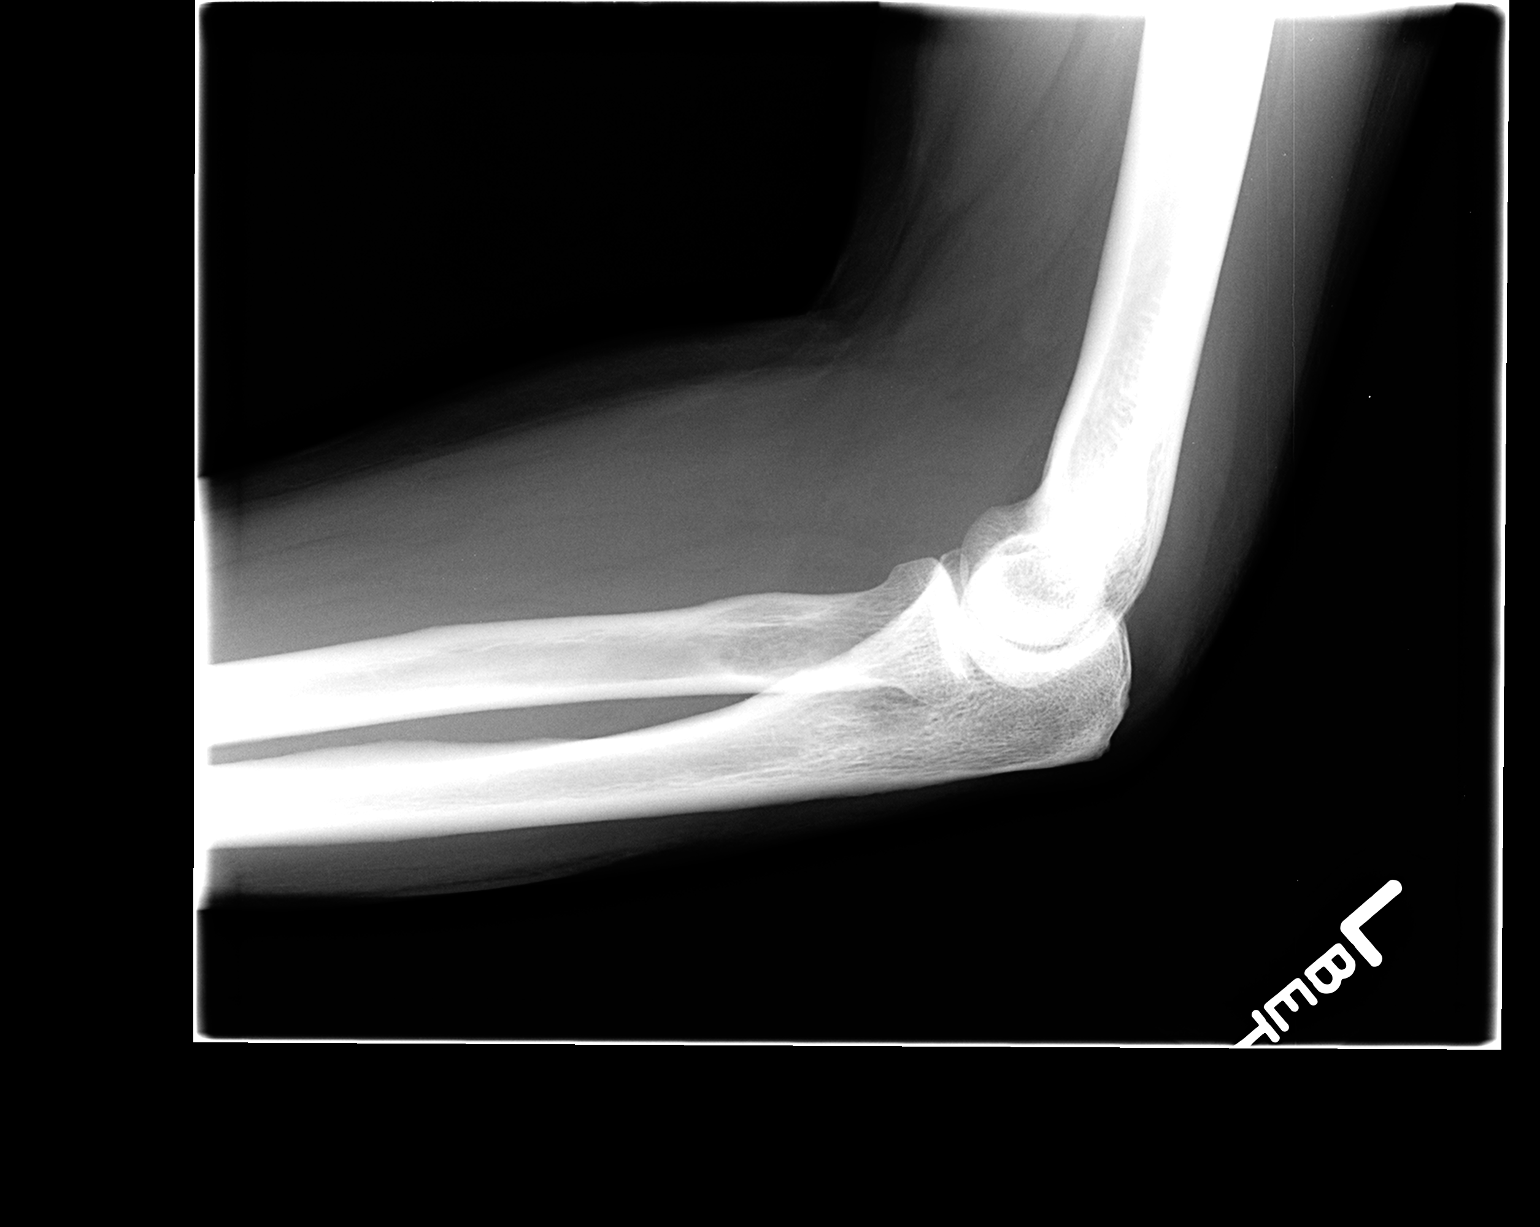

[3 of 3 positions shown; findings below may reference images not displayed]

FINDINGS: No fracture or dislocation.
IMPRESSION: No fracture or dislocation.

## 2011-11-10 ENCOUNTER — Emergency Department (HOSPITAL_COMMUNITY): Payer: Medicaid Other

## 2011-11-10 ENCOUNTER — Encounter (HOSPITAL_COMMUNITY): Payer: Self-pay | Admitting: *Deleted

## 2011-11-10 ENCOUNTER — Emergency Department (HOSPITAL_COMMUNITY)
Admission: EM | Admit: 2011-11-10 | Discharge: 2011-11-10 | Disposition: A | Discharge status: Home / Self Care | Payer: Medicaid Other | Attending: Emergency Medicine | Admitting: Emergency Medicine

## 2011-11-10 DIAGNOSIS — M129 Arthropathy, unspecified: Secondary | ICD-10-CM | POA: Insufficient documentation

## 2011-11-10 DIAGNOSIS — S20219A Contusion of unspecified front wall of thorax, initial encounter: Secondary | ICD-10-CM | POA: Insufficient documentation

## 2011-11-10 DIAGNOSIS — R109 Unspecified abdominal pain: Secondary | ICD-10-CM | POA: Insufficient documentation

## 2011-11-10 DIAGNOSIS — I10 Essential (primary) hypertension: Secondary | ICD-10-CM | POA: Insufficient documentation

## 2011-11-10 DIAGNOSIS — F17200 Nicotine dependence, unspecified, uncomplicated: Secondary | ICD-10-CM | POA: Insufficient documentation

## 2011-11-10 DIAGNOSIS — W19XXXA Unspecified fall, initial encounter: Secondary | ICD-10-CM

## 2011-11-10 DIAGNOSIS — Y92009 Unspecified place in unspecified non-institutional (private) residence as the place of occurrence of the external cause: Secondary | ICD-10-CM | POA: Insufficient documentation

## 2011-11-10 DIAGNOSIS — Y939 Activity, unspecified: Secondary | ICD-10-CM | POA: Insufficient documentation

## 2011-11-10 DIAGNOSIS — F3289 Other specified depressive episodes: Secondary | ICD-10-CM | POA: Insufficient documentation

## 2011-11-10 DIAGNOSIS — M069 Rheumatoid arthritis, unspecified: Secondary | ICD-10-CM | POA: Insufficient documentation

## 2011-11-10 DIAGNOSIS — Z79899 Other long term (current) drug therapy: Secondary | ICD-10-CM | POA: Insufficient documentation

## 2011-11-10 DIAGNOSIS — W11XXXA Fall on and from ladder, initial encounter: Secondary | ICD-10-CM | POA: Insufficient documentation

## 2011-11-10 DIAGNOSIS — F329 Major depressive disorder, single episode, unspecified: Secondary | ICD-10-CM | POA: Insufficient documentation

## 2011-11-10 DIAGNOSIS — S301XXA Contusion of abdominal wall, initial encounter: Secondary | ICD-10-CM | POA: Insufficient documentation

## 2011-11-10 DIAGNOSIS — D649 Anemia, unspecified: Secondary | ICD-10-CM | POA: Insufficient documentation

## 2011-11-10 LAB — URINE MICROSCOPIC-ADD ON

## 2011-11-10 LAB — URINALYSIS, ROUTINE W REFLEX MICROSCOPIC
Bilirubin Urine: NEGATIVE
Specific Gravity, Urine: 1.015 (ref 1.005–1.030)
pH: 6 (ref 5.0–8.0)

## 2011-11-10 LAB — POCT I-STAT, CHEM 8
Chloride: 108 mEq/L (ref 96–112)
HCT: 37 % (ref 36.0–46.0)
Potassium: 3.8 mEq/L (ref 3.5–5.1)
Sodium: 141 mEq/L (ref 135–145)

## 2011-11-10 MED ORDER — OXYCODONE-ACETAMINOPHEN 5-325 MG PO TABS: ORAL_TABLET | ORAL | Status: DC

## 2011-11-10 MED ORDER — FENTANYL CITRATE 0.05 MG/ML IJ SOLN: 50.0000 ug | INTRAMUSCULAR | Status: DC | PRN

## 2011-11-10 MED ORDER — SODIUM CHLORIDE 0.9 % IV SOLN
INTRAVENOUS | Status: DC
  Administered 2011-11-10: 16:00:00 via INTRAVENOUS

## 2011-11-10 MED ORDER — IOHEXOL 300 MG/ML  SOLN
100.0000 mL | Freq: Once | INTRAMUSCULAR | Status: AC | PRN
  Administered 2011-11-10: 100 mL via INTRAVENOUS

## 2011-11-10 NOTE — Discharge Instructions (Signed)
RESOURCE GUIDE  Chronic Pain Problems: Contact [facility]  [phone] Patients need to be referred by their primary care doctor.  Insufficient Money for Medicine: Contact United Way:  call "211" or [facility] [phone].  No Primary Care Doctor: - Call Health Connect  [phone] - can help you locate a primary care doctor that  accepts your insurance, provides certain services, etc. - Physician Referral Service- [phone]  Agencies that provide inexpensive medical care: - [facility]  [phone] - [facility]  [phone] - [facility]  [phone] - Women's Clinic  [phone] - [facility] Parenthood  [phone] Haynes Bast[facility]  [phone]  Medicaid-accepting [facility] Providers: - [facility]- [address], [address]  [phone], Mon-Fri 9am-7pm, Sat 9am-1pm - [facility]- [address], [address] [place]  [phone] - [facility]- [address], [address] [place]  [phone] [facility]- [facility]- [address] [place] [address]  [phone] - Renaye RakersVeita Bland- [address], [address], [phone]  Only accepts [place] patients after they have their name  applied to their card  Self Pay (no insurance) in [place]: - Sickle Cell Patients: Dr Willey BladeEric Dean, [facility]  [address], [phone] - [facility]- [address]  [phone]       Redge Gainer-     [facility] [place]- [address], [address]       -     [facility]- see information above (Speak to Cit[employer]Pam H if you do not have insurance)       -  [facility]- [address], [phone]       -  [facility] [place]- [address],  [phone]       -  [facility]- [address] [place] [address], [phone]       -  Dr Julio Sickssei-Bonsu-  [address], [address], [place], [phone]       -  [facility]- [address], [phone]       -  [facility] - [address] [place] [address], [phone], also [address], [phone]       -    [facility]- [address], [phone], 1st & 3rd Saturday   every month, 10am-1pm  1) Find a Doctor and Pay Out of Pocket Although you won't have to find out who is covered by your insurance plan, it is a good idea to ask around and get recommendations. You will then need to call the office and see if the doctor you have chosen will accept you as a new patient and what types of options they offer for patients who are self-pay. Some doctors offer discounts or will set up payment plans for their patients who do not have insurance, but you will need to ask so you aren't surprised when you get to your appointment.  2) Contact Your Local Health Department Not all health departments have doctors that can see patients for sick visits, but many do, so it is worth a call to see if yours does. If you don't know where your local health department is, you can check in your phone book. The CDC also has a tool to help you locate your state's health department, and many state websites also have  listings of all of their local health departments.  3) Find a Walk-in Clinic If your illness is not likely to be very severe or complicated, you may want to try a walk in clinic. These are popping up all over the country in pharmacies, drugstores, and shopping centers. They're usually staffed by nurse practitioners or physician assistants that have been trained to treat common illnesses and complaints. They're usually fairly quick and inexpensive. However, if you have serious medical issues or chronic medical problems, these are probably not your best option  STD Testing - [facility] Department of [facility] Luis Llorons Torres, STD Clinic, [address] [place], phone [phone] or [phone].  Monday - Friday, call for an appointment. [facility]  Department of [employer], STD Clinic, [address], [place], phone [phone] or [phone].  Monday - Friday, call for an appointment.  Abuse/Neglect: [facility] Child Abuse Hotline [phone]) 293-8840 [facility] Child Abuse Hotline [phone]) 337-1068 (After Hours)  Emergency Shelter:  [facility] [phone]) [phone]  Maternity Homes: - [facility] [place] [facility] [phone] - [facility] [phone]  MRSA Hotline #:   (508)367-7619  [facility] Resources  [facility]  [employer] [facility] Dept. [address]                 [address]         [address] [place] [address]  Blondell Reveal Phone:  [phone]                                  Phone:  [phone]                   Phone:  [phone]  [facility], [phone] - [phone]       -     [facility] in [place], [address],                                  [phone], [facility] Child Abuse Hotline (812)289-2172 or [phone] (After Hours)   Behavioral Health Services  Substance Abuse Resources: - [facility]  [phone] - [facility] [phone] - [facility] [place] [phone] Floydene Flock [phone] - Residential & Outpatient Substance Abuse Program  [phone]  Psychological Services: [facility]  [phone] Services  [phone] - [facility], [phone] [place]. [address], [place], ACCESS LINE: 9311756726 or [phone], [email or web address]  Dental Assistance  If unable to pay or uninsured, contact:  [facility] or [facility]. to become qualified for the adult dental  clinic.  Patients with Medicaid: [facility] [phone] [address]n Qu[address], [phone] [address] [phone]  If unable  to pay, or uninsured, contact [facility] [phone]) or [facility] Department [phone] in [place], [phone] in [facility]) to become qualified for the adult dental clinic  Other Low-Cost Community Dental Services: - [facility]- [address] [place], [place], [place], [address], [phone], Ext. [phone], 2nd and 4th Thursday of the month at 6:30am.  10 clients each day by appointment, can sometimes see walk-in patients if someone does not show for an appointment. [facility]- [address] [place] [address] Ether Griffins [place], [place], [address], [phone] - [facility]- [address] [place], [place], [address], [phone] [facility] Health Department- [phone] [facility] Health Department- [phone] [facility] [phone]     Take the prescription as directed.  Apply moist heat or ice to the area(s) of discomfort, for 15 minutes at a time, several times per day for the next few days.  Do not fall asleep on a heating or ice pack.  Call your regular medical doctor tomorrow to schedule a follow up appointment this week.  Return to the Emergency Department immediately if worsening.

## 2011-11-10 NOTE — ED Notes (Addendum)
Fell off ladder today,onto shrubbery, pain rt side of abd  And rt side of chest..  No head injury,  Alert talking.    Pt was up app 8 ft on ladder , pressure washing her house.

## 2011-11-10 NOTE — ED Notes (Signed)
Pt c/o right side and right hip pain after an injury that occurred earlier today. Pt states she was on ladder when she lost he balance and fell approximately 8 feet to the ground. Pt was ambulatory after fall and denies LOC.

## 2011-11-10 NOTE — ED Provider Notes (Signed)
History     CSN: [id number]  Arrival date & time 11/10/11  1417   First MD Initiated Contact with Patient 11/10/11 1516      Chief Complaint  Patient presents with  . Fall     HPI Pt was seen at 1525.  Per pt, c/o sudden onset and resolution of one episode of slip and fall that occurred today PTA.  Pt states she fell off the top of an 8 foot ladder while pressure washing the house. Pt states she landed on the ground on her right side.  Pt c/o right ribs, back and abd pain.  Denies hitting head, no LOC, no prodromal symptoms before fall, no arms or legs pain, no neck pain, no focal motor weakness, no tingling/numbness in extremities, no CP/SOB, no N/V/D.    Past Medical History  Diagnosis Date  . Rheumatoid arthritis   . Chronic anemia   . Hypertension   . Arthritis, rheumatoid   . Incidental lung nodule   . Depression   . Chronic pain   . Chronic back pain     Past Surgical History  Procedure Date  . Tubal ligation   . Endometrial ablation   . Breast cyst excision 12/31/2010    Procedure: CYST EXCISION BREAST;  Surgeon: Fabio Bering, MD;  Location: [facility];  Service: General;  Laterality: Right;  Excision Sebaceous Cyst Right Breast    Family History  Problem Relation Age of Onset  . Anesthesia problems Neg Hx   . Cancer Father     History  Substance Use Topics  . Smoking status: Current Every Day Smoker -- 0.0 packs/day for 30 years    Types: Cigarettes  . Smokeless tobacco: Never Used  . Alcohol Use: No     occassional    OB History    Grav Para Term Preterm Abortions TAB SAB Ect Mult Living   6 5 5  1  1   5       Review of Systems ROS: Statement: All systems negative except as marked or noted in the HPI; Constitutional: Negative for fever and chills. ; ; Eyes: Negative for eye pain, redness and discharge. ; ; ENMT: Negative for ear pain, hoarseness, nasal congestion, sinus pressure and sore throat. ; ; Cardiovascular: Negative for chest pain,  palpitations, diaphoresis, dyspnea and peripheral edema. ; ; Respiratory: Negative for cough, wheezing and stridor. ; ; Gastrointestinal: +abd pain. Negative for nausea, vomiting, diarrhea, blood in stool, hematemesis, jaundice and rectal bleeding. . ; ; Genitourinary: Negative for dysuria, flank pain and hematuria. ; ; Musculoskeletal: +back pain, chest wall pain. Negative for neck pain. Negative for swelling and trauma.; ; Skin: Negative for pruritus, rash, abrasions, blisters, bruising and skin lesion.; ; Neuro: Negative for headache, lightheadedness and neck stiffness. Negative for weakness, altered level of consciousness , altered mental status, extremity weakness, paresthesias, involuntary movement, seizure and syncope.       Allergies  Review of patient's allergies indicates no known allergies.  Home Medications   Current Outpatient Rx  Name Route Sig Dispense Refill  . CALCIUM 600 + D PO Oral Take 1 tablet by mouth every evening.     Marland Kitchen FERROUS SULFATE 325 (65 FE) MG PO TABS Oral Take 325 mg by mouth 2 (two) times daily. For anemia     . FOLIC ACID 1 MG PO TABS Oral Take 1 mg by mouth daily.      Marland Kitchen HYDROXYCHLOROQUINE SULFATE 200 MG PO TABS Oral Take 200  mg by mouth daily.    . IBUPROFEN 200 MG PO TABS Oral Take 400 mg by mouth daily as needed. For pain. *Took once to ease pain*    . METHOTREXATE 2.5 MG PO TABS Oral Take 12.5 mg by mouth once a week. Take 5 tablets every Monday and Tuesday :Caution:Chemotherapy. Protect from light.    . ADULT MULTIVITAMIN W/MINERALS CH Oral Take 1 tablet by mouth daily.      BP 126/87  Pulse 66  Temp 98.5 F (36.9 C) (Oral)  Resp 18  Ht 5\' 6"  (1.676 m)  Wt 148 lb (67.132 kg)  BMI 23.89 kg/m2  SpO2 100%  Physical Exam 1530: Physical examination: Vital signs and O2 SAT: Reviewed; Constitutional: Well developed, Well nourished, Well hydrated, In no acute distress; Head and Face: Normocephalic, Atraumatic; Eyes: EOMI, PERRL, No scleral icterus;  ENMT: Mouth and pharynx normal, Left TM normal, Right TM normal, Mucous membranes moist; Neck: Supple, Trachea midline; Spine: +TTP right lumbar paraspinal muscles. No midline CS, TS, LS tenderness. No abrasions or ecchymosis.; Cardiovascular: Regular rate and rhythm, No murmur, rub, or gallop; Respiratory: Breath sounds clear & equal bilaterally, No rales, rhonchi, wheezes. Normal respiratory effort/excursion; Chest: Nontender, No deformity, Movement normal, No crepitus, No abrasions or ecchymosis.; Abdomen: Soft, +RUQ and RLQ tender to palp. No rebound or guarding. Nondistended, Normal bowel sounds, No abrasions or ecchymosis.; Genitourinary: No CVA tenderness;; Extremities: No deformity, Full range of motion major/large joints of bilat UE's and LE's without pain or tenderness to palp, Neurovascularly intact, Pulses normal, No tenderness, No edema, Pelvis stable; Neuro: AA&Ox3, GCS 15.  Major CN grossly intact. Speech clear. Gait steady. No gross focal motor or sensory deficits in extremities.; Skin: Color normal, Warm, Dry    ED Course  Procedures    MDM  MDM Reviewed: nursing note, vitals and previous chart Interpretation: labs, x-ray and CT scan   Results for orders placed during the hospital encounter of 11/10/11  PREGNANCY, URINE      Component Value Range   Preg Test, Ur NEGATIVE  NEGATIVE  URINALYSIS, ROUTINE W REFLEX MICROSCOPIC      Component Value Range   Color, Urine YELLOW  YELLOW   APPearance CLEAR  CLEAR   Specific Gravity, Urine 1.015  1.005 - 1.030   pH 6.0  5.0 - 8.0   Glucose, UA NEGATIVE  NEGATIVE mg/dL   Hgb urine dipstick SMALL (*) NEGATIVE   Bilirubin Urine NEGATIVE  NEGATIVE   Ketones, ur NEGATIVE  NEGATIVE mg/dL   Protein, ur NEGATIVE  NEGATIVE mg/dL   Urobilinogen, UA 0.2  0.0 - 1.0 mg/dL   Nitrite NEGATIVE  NEGATIVE   Leukocytes, UA NEGATIVE  NEGATIVE  URINE MICROSCOPIC-ADD ON      Component Value Range   Squamous Epithelial / LPF RARE  RARE   RBC / HPF  0-2  <3 RBC/hpf   Bacteria, UA RARE  RARE  POCT I-STAT, CHEM 8      Component Value Range   Sodium 141  135 - 145 mEq/L   Potassium 3.8  3.5 - 5.1 mEq/L   Chloride 108  96 - 112 mEq/L   BUN 7  6 - 23 mg/dL   Creatinine, Ser 1.61  0.50 - 1.10 mg/dL   Glucose, Bld 89  70 - 99 mg/dL   Calcium, Ion 0.96 (*) 1.12 - 1.23 mmol/L   TCO2 24  0 - 100 mmol/L   Hemoglobin 12.6  12.0 - 15.0 g/dL   HCT  37.0  36.0 - 46.0 %   Ct Chest W Contrast 11/10/2011  *RADIOLOGY REPORT*  Clinical Data:  Fall from ladder, right chest/abdomen pain  CT CHEST, ABDOMEN AND PELVIS WITH CONTRAST  Technique:  Multidetector CT imaging of the chest, abdomen and pelvis was performed following the standard protocol during bolus administration of intravenous contrast.  Contrast:  100 ml Omnipaque-300 IV  Comparison:  Multiple prior CTs, including 09/22/2011 and 05/09/2011  CT CHEST  Findings:  4 x 6 mm nodule in the right lower lobe (series 3/image 43), unchanged from 05/09/2011.  No pleural effusion or pneumothorax.  Stable right thyroid nodule.  The heart is normal in size.  No pericardial effusion.  Age advanced coronary atherosclerosis.  Atherosclerotic calcifications of the aortic arch.  No suspicious mediastinal, hilar, or axillary lymphadenopathy.  Visualized osseous structures are within normal limits.  No fracture is seen.  IMPRESSION: No evidence of traumatic injury to the chest.  4 x 6 mm right lower lobe nodule, unchanged from 05/09/2011, likely benign.  A single follow-up CT is suggested to document 12 month stability (if low risk for bronchogenic carcinoma) or 18-24 month stability (if high risk).  Age advanced coronary atherosclerosis.  This recommendation follows the consensus statement: Guidelines for Management of Small Pulmonary Nodules Detected on CT Scans: A Statement from the Fleischner Society as published in Radiology 2005; 237:395-400.  CT ABDOMEN AND PELVIS  Findings:  Liver, spleen, pancreas, and adrenal glands  are within normal limits.  Gallbladder is underdistended.  No intrahepatic or extrahepatic ductal dilatation.  Kidneys are within normal limits.  No hydronephrosis.  No evidence of bowel obstruction.  Normal appendix.  Atherosclerotic calcifications of the abdominal aorta and branch vessels.  No abdominopelvic ascites.  No hemoperitoneum.  No free air.  No suspicious abdominopelvic lymphadenopathy.  Uterus and bilateral ovaries unremarkable.  Bladder is within normal limits.  Mild degenerative changes at L4-5.  No fracture is seen.  IMPRESSION: No evidence of traumatic injury to the abdomen or pelvis.   Original Report Authenticated By: Charline Bills, M.D.    Ct Abdomen Pelvis W Contrast 11/10/2011  *RADIOLOGY REPORT*  Clinical Data:  Fall from ladder, right chest/abdomen pain  CT CHEST, ABDOMEN AND PELVIS WITH CONTRAST  Technique:  Multidetector CT imaging of the chest, abdomen and pelvis was performed following the standard protocol during bolus administration of intravenous contrast.  Contrast:  100 ml Omnipaque-300 IV  Comparison:  Multiple prior CTs, including 09/22/2011 and 05/09/2011  CT CHEST  Findings:  4 x 6 mm nodule in the right lower lobe (series 3/image 43), unchanged from 05/09/2011.  No pleural effusion or pneumothorax.  Stable right thyroid nodule.  The heart is normal in size.  No pericardial effusion.  Age advanced coronary atherosclerosis.  Atherosclerotic calcifications of the aortic arch.  No suspicious mediastinal, hilar, or axillary lymphadenopathy.  Visualized osseous structures are within normal limits.  No fracture is seen.  IMPRESSION: No evidence of traumatic injury to the chest.  4 x 6 mm right lower lobe nodule, unchanged from 05/09/2011, likely benign.  A single follow-up CT is suggested to document 12 month stability (if low risk for bronchogenic carcinoma) or 18-24 month stability (if high risk).  Age advanced coronary atherosclerosis.  This recommendation follows the  consensus statement: Guidelines for Management of Small Pulmonary Nodules Detected on CT Scans: A Statement from the Fleischner Society as published in Radiology 2005; 237:395-400.  CT ABDOMEN AND PELVIS  Findings:  Liver, spleen, pancreas, and adrenal  glands are within normal limits.  Gallbladder is underdistended.  No intrahepatic or extrahepatic ductal dilatation.  Kidneys are within normal limits.  No hydronephrosis.  No evidence of bowel obstruction.  Normal appendix.  Atherosclerotic calcifications of the abdominal aorta and branch vessels.  No abdominopelvic ascites.  No hemoperitoneum.  No free air.  No suspicious abdominopelvic lymphadenopathy.  Uterus and bilateral ovaries unremarkable.  Bladder is within normal limits.  Mild degenerative changes at L4-5.  No fracture is seen.  IMPRESSION: No evidence of traumatic injury to the abdomen or pelvis.   Original Report Authenticated By: Charline Bills, M.D.    Dg Chest Port 1 View 11/10/2011  *RADIOLOGY REPORT*  Clinical Data: Larey Seat off ladder  PORTABLE CHEST - 1 VIEW  Comparison: CT chest of 09/22/2011  Findings: No active infiltrate or effusion is seen.  No pneumothorax is noted.  There is no evidence of pleural effusion. The heart is within normal limits in size.  No bony abnormality is seen.  IMPRESSION: No active lung disease.   Original Report Authenticated By: Juline Patch, M.D.      Therry.Ripa:  No acute findings on workup today.  Pt has been climbing on and off stretcher by herself easily.  Ambulating in the ED multiple times with steady upright gait.  VS remain stable. Will tx symptomatically.  Pt wants to go home now. Dx and testing d/w pt.  Questions answered.  Verb understanding, agreeable to d/c home with outpt f/u.         Laray Anger, DO 11/12/11 2149

## 2012-01-07 ENCOUNTER — Encounter (HOSPITAL_COMMUNITY): Payer: Self-pay

## 2012-01-07 ENCOUNTER — Emergency Department (HOSPITAL_COMMUNITY)
Admission: EM | Admit: 2012-01-07 | Discharge: 2012-01-07 | Disposition: A | Discharge status: Home / Self Care | Payer: Medicaid Other | Attending: Emergency Medicine | Admitting: Emergency Medicine

## 2012-01-07 DIAGNOSIS — Z862 Personal history of diseases of the blood and blood-forming organs and certain disorders involving the immune mechanism: Secondary | ICD-10-CM | POA: Insufficient documentation

## 2012-01-07 DIAGNOSIS — R911 Solitary pulmonary nodule: Secondary | ICD-10-CM | POA: Insufficient documentation

## 2012-01-07 DIAGNOSIS — F17200 Nicotine dependence, unspecified, uncomplicated: Secondary | ICD-10-CM | POA: Insufficient documentation

## 2012-01-07 DIAGNOSIS — Z8659 Personal history of other mental and behavioral disorders: Secondary | ICD-10-CM | POA: Insufficient documentation

## 2012-01-07 DIAGNOSIS — I10 Essential (primary) hypertension: Secondary | ICD-10-CM | POA: Insufficient documentation

## 2012-01-07 DIAGNOSIS — G8929 Other chronic pain: Secondary | ICD-10-CM | POA: Insufficient documentation

## 2012-01-07 DIAGNOSIS — M069 Rheumatoid arthritis, unspecified: Secondary | ICD-10-CM | POA: Insufficient documentation

## 2012-01-07 DIAGNOSIS — Z79899 Other long term (current) drug therapy: Secondary | ICD-10-CM | POA: Insufficient documentation

## 2012-01-07 MED ORDER — HYDROCODONE-ACETAMINOPHEN 5-325 MG PO TABS: 2.0000 | ORAL_TABLET | ORAL | Status: DC | PRN

## 2012-01-07 MED ORDER — METHYLPREDNISOLONE 4 MG PO KIT: PACK | ORAL | Status: DC

## 2012-01-07 MED ORDER — DEXAMETHASONE SODIUM PHOSPHATE 10 MG/ML IJ SOLN
10.0000 mg | Freq: Once | INTRAMUSCULAR | Status: AC
  Administered 2012-01-07: 10 mg via INTRAMUSCULAR
  Filled 2012-01-07: qty 1

## 2012-01-07 NOTE — ED Provider Notes (Signed)
History     CSN: [id number]  Arrival date & time 01/07/12  1339   First MD Initiated Contact with Patient 01/07/12 1545      Chief Complaint  Patient presents with  . Joint Pain    (Consider location/radiation/quality/duration/timing/severity/associated sxs/prior treatment) HPI Comments: Patient has a long history of rheumatoid arthritis. She sees a specialist in [place] and takes methotrexate. She says her dose was recently increased but in the last few days her joints have become more swollen and painful. She complains of pain in the left hand and wrist more than right. Patient has not had any fever. She denies any injury to the areas.   Past Medical History  Diagnosis Date  . Rheumatoid arthritis   . Chronic anemia   . Hypertension   . Arthritis, rheumatoid   . Incidental lung nodule   . Depression   . Chronic pain   . Chronic back pain     Past Surgical History  Procedure Date  . Tubal ligation   . Endometrial ablation   . Breast cyst excision 12/31/2010    Procedure: CYST EXCISION BREAST;  Surgeon: Fabio Bering, MD;  Location: [facility];  Service: General;  Laterality: Right;  Excision Sebaceous Cyst Right Breast    Family History  Problem Relation Age of Onset  . Anesthesia problems Neg Hx   . Cancer Father     History  Substance Use Topics  . Smoking status: Current Every Day Smoker -- 0.0 packs/day for 30 years    Types: Cigarettes  . Smokeless tobacco: Never Used  . Alcohol Use: No     Comment: occassional    OB History    Grav Para Term Preterm Abortions TAB SAB Ect Mult Living   6 5 5  1  1   5       Review of Systems  Constitutional: Negative for fever.  Musculoskeletal: Positive for joint swelling and arthralgias.  All other systems reviewed and are negative.    Allergies  Review of patient's allergies indicates no known allergies.  Home Medications   Current Outpatient Rx  Name  Route  Sig  Dispense  Refill  . CALCIUM 600 + D  PO   Oral   Take 1 tablet by mouth every evening.          Marland Kitchen FERROUS SULFATE 325 (65 FE) MG PO TABS   Oral   Take 325 mg by mouth 2 (two) times daily. For anemia          . FOLIC ACID 1 MG PO TABS   Oral   Take 1 mg by mouth daily.           Marland Kitchen HYDROXYCHLOROQUINE SULFATE 200 MG PO TABS   Oral   Take 200 mg by mouth daily.         Marland Kitchen METHOTREXATE 2.5 MG PO TABS   Oral   Take 12.5 mg by mouth once a week. Take 5 tablets every Monday and Tuesday :Caution:Chemotherapy. Protect from light.         . ADULT MULTIVITAMIN W/MINERALS CH   Oral   Take 1 tablet by mouth daily.           BP 134/85  Pulse 78  Temp 97.6 F (36.4 C)  Resp 16  Wt 148 lb (67.132 kg)  SpO2 100%  Physical Exam  Constitutional: She is oriented to person, place, and time. She appears well-developed and well-nourished. No distress.  HENT:  Head:  Normocephalic and atraumatic.  Right Ear: Hearing normal.  Nose: Nose normal.  Mouth/Throat: Oropharynx is clear and moist and mucous membranes are normal.  Eyes: Conjunctivae normal and EOM are normal. Pupils are equal, round, and reactive to light.  Neck: Normal range of motion. Neck supple.  Cardiovascular: Normal rate, regular rhythm, S1 normal and S2 normal.  Exam reveals no gallop and no friction rub.   No murmur heard. Pulmonary/Chest: Effort normal and breath sounds normal. No respiratory distress. She exhibits no tenderness.  Abdominal: Soft. Normal appearance and bowel sounds are normal. There is no hepatosplenomegaly. There is no tenderness. There is no rebound, no guarding, no tenderness at McBurney's point and negative Murphy's sign. No hernia.  Musculoskeletal:       Diffuse swelling, tenderness and painful range of motion bilateral fingers and hands, left greater than right.  Neurological: She is alert and oriented to person, place, and time. She has normal strength. No cranial nerve deficit or sensory deficit. Coordination normal. GCS eye  subscore is 4. GCS verbal subscore is 5. GCS motor subscore is 6.  Skin: Skin is warm, dry and intact. No rash noted. No cyanosis.  Psychiatric: She has a normal mood and affect. Her speech is normal and behavior is normal. Thought content normal.    ED Course  Procedures (including critical care time)  Labs Reviewed - No data to display No results found.   1. Rheumatoid arthritis       MDM   Patient presents with complaints of acute exacerbation of her chronic rheumatoid arthritis. She does not have any signs of overlying skin or joint infection. There is no injury. She does require workup at this time, will be treated with a short course of corticosteroids and analgesia.       Gilda Crease, MD 01/07/12 1556

## 2012-01-07 NOTE — ED Notes (Signed)
C/o generalized pain from arthritis per pt. Pt had appt today for Rheumatologist and MD had to reschedule.

## 2012-01-24 ENCOUNTER — Emergency Department (HOSPITAL_COMMUNITY): Payer: Medicaid Other

## 2012-01-24 ENCOUNTER — Emergency Department (HOSPITAL_COMMUNITY)
Admission: EM | Admit: 2012-01-24 | Discharge: 2012-01-24 | Disposition: A | Discharge status: Home / Self Care | Payer: Medicaid Other | Attending: Emergency Medicine | Admitting: Emergency Medicine

## 2012-01-24 ENCOUNTER — Encounter (HOSPITAL_COMMUNITY): Payer: Self-pay | Admitting: *Deleted

## 2012-01-24 DIAGNOSIS — S40021A Contusion of right upper arm, initial encounter: Secondary | ICD-10-CM

## 2012-01-24 DIAGNOSIS — S63501A Unspecified sprain of right wrist, initial encounter: Secondary | ICD-10-CM

## 2012-01-24 DIAGNOSIS — Z8659 Personal history of other mental and behavioral disorders: Secondary | ICD-10-CM | POA: Insufficient documentation

## 2012-01-24 DIAGNOSIS — D649 Anemia, unspecified: Secondary | ICD-10-CM | POA: Insufficient documentation

## 2012-01-24 DIAGNOSIS — Z79899 Other long term (current) drug therapy: Secondary | ICD-10-CM | POA: Insufficient documentation

## 2012-01-24 DIAGNOSIS — I10 Essential (primary) hypertension: Secondary | ICD-10-CM | POA: Insufficient documentation

## 2012-01-24 DIAGNOSIS — S139XXA Sprain of joints and ligaments of unspecified parts of neck, initial encounter: Secondary | ICD-10-CM | POA: Insufficient documentation

## 2012-01-24 DIAGNOSIS — Y9352 Activity, horseback riding: Secondary | ICD-10-CM | POA: Insufficient documentation

## 2012-01-24 DIAGNOSIS — S7000XA Contusion of unspecified hip, initial encounter: Secondary | ICD-10-CM | POA: Insufficient documentation

## 2012-01-24 DIAGNOSIS — S7002XA Contusion of left hip, initial encounter: Secondary | ICD-10-CM

## 2012-01-24 DIAGNOSIS — Z8739 Personal history of other diseases of the musculoskeletal system and connective tissue: Secondary | ICD-10-CM | POA: Insufficient documentation

## 2012-01-24 DIAGNOSIS — R0602 Shortness of breath: Secondary | ICD-10-CM | POA: Insufficient documentation

## 2012-01-24 DIAGNOSIS — S8990XA Unspecified injury of unspecified lower leg, initial encounter: Secondary | ICD-10-CM | POA: Insufficient documentation

## 2012-01-24 DIAGNOSIS — S20219A Contusion of unspecified front wall of thorax, initial encounter: Secondary | ICD-10-CM | POA: Insufficient documentation

## 2012-01-24 DIAGNOSIS — Y9289 Other specified places as the place of occurrence of the external cause: Secondary | ICD-10-CM | POA: Insufficient documentation

## 2012-01-24 DIAGNOSIS — S99929A Unspecified injury of unspecified foot, initial encounter: Secondary | ICD-10-CM | POA: Insufficient documentation

## 2012-01-24 DIAGNOSIS — S63502A Unspecified sprain of left wrist, initial encounter: Secondary | ICD-10-CM

## 2012-01-24 DIAGNOSIS — F17200 Nicotine dependence, unspecified, uncomplicated: Secondary | ICD-10-CM | POA: Insufficient documentation

## 2012-01-24 DIAGNOSIS — S161XXA Strain of muscle, fascia and tendon at neck level, initial encounter: Secondary | ICD-10-CM

## 2012-01-24 DIAGNOSIS — Z8709 Personal history of other diseases of the respiratory system: Secondary | ICD-10-CM | POA: Insufficient documentation

## 2012-01-24 DIAGNOSIS — S63509A Unspecified sprain of unspecified wrist, initial encounter: Secondary | ICD-10-CM | POA: Insufficient documentation

## 2012-01-24 DIAGNOSIS — S40029A Contusion of unspecified upper arm, initial encounter: Secondary | ICD-10-CM | POA: Insufficient documentation

## 2012-01-24 DIAGNOSIS — S0990XA Unspecified injury of head, initial encounter: Secondary | ICD-10-CM | POA: Insufficient documentation

## 2012-01-24 MED ORDER — FENTANYL CITRATE 0.05 MG/ML IJ SOLN: 100.0000 ug | Freq: Once | INTRAMUSCULAR | Status: DC

## 2012-01-24 MED ORDER — OXYCODONE-ACETAMINOPHEN 5-325 MG PO TABS: 2.0000 | ORAL_TABLET | Freq: Four times a day (QID) | ORAL | Status: DC | PRN

## 2012-01-24 MED ORDER — OXYCODONE-ACETAMINOPHEN 5-325 MG PO TABS
2.0000 | ORAL_TABLET | Freq: Once | ORAL | Status: AC
  Administered 2012-01-24: 2 via ORAL
  Filled 2012-01-24: qty 2

## 2012-01-24 MED ORDER — FENTANYL CITRATE 0.05 MG/ML IJ SOLN
100.0000 ug | Freq: Once | INTRAMUSCULAR | Status: AC
  Administered 2012-01-24: 100 ug via NASAL
  Filled 2012-01-24: qty 2

## 2012-01-24 MED ORDER — HYDROMORPHONE HCL PF 2 MG/ML IJ SOLN
2.0000 mg | Freq: Once | INTRAMUSCULAR | Status: AC
  Administered 2012-01-24: 2 mg via INTRAMUSCULAR
  Filled 2012-01-24: qty 1

## 2012-01-24 NOTE — ED Notes (Signed)
Pt was riding her horse and the horse lost it's footing when they tried to climb a bank causing the horse to fall landing on pt. Pt c/o pain to right rib arealeft hip, sob, bilateral hands, admits to loc, unsure of how long pt was "out", neck is stiff. Pt denies any loc. Lung sound present bilateral in triage.

## 2012-01-24 NOTE — ED Provider Notes (Signed)
History  This chart was scribed for Hurman Horn, MD by Madaline Brilliant, ED Scribe. The patient was seen in room [facility]. Patient's care was started at 1805.   CSN: [id number]  Arrival date & time 01/24/12  1748   First MD Initiated Contact with Patient 01/24/12 1805      Chief Complaint  Patient presents with  . Fall    The history is provided by the patient. No language interpreter was used.   [name] is a 47 y.o. female who presents to the Emergency Department complaining of a fall today while riding horse about two hours ago. She was riding a horse and the horse lost it's footing causing it to fall backwards partially onto pt. She states that she has constant, moderate pain in bilateral hands, right wrist, right arm, right chest, right abdomen, and bilateral knee pain that is worse in the right knee compared to her left knee. She states that she has mild SOB. She reports that she was not wearing a helmet. She seems somewhat unsure that she remembers everything but thinks that she does. She denies LOC, headache, new neck pain, confusion, new weakness, numbness and any other pain. She walked following the fall and at first wasn't going to come in to the ED. She states that she has a hx of chronic neck and back pain. She denies recent or chronic use of pain medications such as hydrocodone.  Past Medical History  Diagnosis Date  . Rheumatoid arthritis   . Chronic anemia   . Hypertension   . Arthritis, rheumatoid   . Incidental lung nodule   . Depression   . Chronic pain   . Chronic back pain     Past Surgical History  Procedure Date  . Tubal ligation   . Endometrial ablation   . Breast cyst excision 12/31/2010    Procedure: CYST EXCISION BREAST;  Surgeon: Fabio Bering, MD;  Location: [facility];  Service: General;  Laterality: Right;  Excision Sebaceous Cyst Right Breast    Family History  Problem Relation Age of Onset  . Anesthesia problems Neg Hx   . Cancer Father      History  Substance Use Topics  . Smoking status: Current Every Day Smoker -- 0.0 packs/day for 30 years    Types: Cigarettes  . Smokeless tobacco: Never Used  . Alcohol Use: No     Comment: occassional    OB History    Grav Para Term Preterm Abortions TAB SAB Ect Mult Living   6 5 5  1  1   5       Review of Systems  All other systems reviewed and are negative.   10 Systems reviewed and all are negative for acute change except as noted in the HPI.  Allergies  Review of patient's allergies indicates no known allergies.  Home Medications   Current Outpatient Rx  Name  Route  Sig  Dispense  Refill  . CALCIUM 600 + D PO   Oral   Take 1 tablet by mouth every evening.          Marland Kitchen FERROUS SULFATE 325 (65 FE) MG PO TABS   Oral   Take 325 mg by mouth 2 (two) times daily. For anemia          . FOLIC ACID 1 MG PO TABS   Oral   Take 1 mg by mouth daily.           Marland Kitchen  HYDROXYCHLOROQUINE SULFATE 200 MG PO TABS   Oral   Take 200 mg by mouth daily.         . ADULT MULTIVITAMIN W/MINERALS CH   Oral   Take 1 tablet by mouth daily.         Marland Kitchen METHOTREXATE 2.5 MG PO TABS   Oral   Take 12.5 mg by mouth once a week. Take 5 tablets every Monday and Tuesday :Caution:Chemotherapy. Protect from light.         . OXYCODONE-ACETAMINOPHEN 5-325 MG PO TABS   Oral   Take 2 tablets by mouth every 6 (six) hours as needed for pain.   20 tablet   0     Triage Vitals:  BP 135/84  Pulse 97  Temp 98 F (36.7 C) (Oral)  Resp 20  Ht 5\' 6"  (1.676 m)  Wt 150 lb (68.04 kg)  BMI 24.21 kg/m2  SpO2 100%  Physical Exam  Nursing note and vitals reviewed. Constitutional:       Awake, alert, nontoxic appearance with baseline speech for patient.  HENT:  Head: Atraumatic.  Mouth/Throat: No oropharyngeal exudate.  Eyes: EOM are normal. Pupils are equal, round, and reactive to light. Right eye exhibits no discharge. Left eye exhibits no discharge.  Neck: Neck supple.       Mild  diffuse and cervical and  paracervical ternderness  Cardiovascular: Normal rate and regular rhythm.   No murmur heard. Pulmonary/Chest: Effort normal and breath sounds normal. No stridor. No respiratory distress. She has no wheezes. She has no rales. She exhibits tenderness.       Right lateral chest wall tenderness w/o deformity.  Abdominal: Soft. Bowel sounds are normal. She exhibits no mass. There is no tenderness. There is no rebound.  Musculoskeletal: She exhibits no tenderness.       Baseline ROM, moves extremities with no obvious new focal weakness.  Mild diffuse left wrist and left hand tenderness with no deformity noted.  Entire right arm diffusely tender, including wrist and hand with intact light touch and motor. Distribution of right median, right radial, and right ulnar nerve function.  Billaterial diffuse soreness of both knees (mildly tender with flexion to 90 degrees and full extension. Negative Lachman's sign. No laxity on varus or valgus stress testing. Negative McMurray's sign.  The left hip is tender.  The back has no midline tenderness.  Lymphadenopathy:    She has no cervical adenopathy.  Neurological:       Awake, alert, cooperative and aware of situation; motor strength bilaterally; sensation normal to light touch bilaterally; peripheral visual fields full to confrontation; no facial asymmetry; tongue midline; major cranial nerves appear intact; no pronator drift, normal finger to nose bilaterally, baseline gait without new ataxia.  Skin: No rash noted.  Psychiatric: She has a normal mood and affect.     ED Course  Procedures (including critical care time)  DIAGNOSTIC STUDIES: Oxygen Saturation is 100% on room air, normal by my interpretation.    COORDINATION OF CARE:  1830 Patient / Family / Caregiver understand and agree with initial ED impression and plan with expectations set for ED visit. 1830 Ordered:   Medications  oxyCODONE-acetaminophen  (PERCOCET) 5-325 MG per tablet (not administered)  oxyCODONE-acetaminophen (PERCOCET/ROXICET) 5-325 MG per tablet 2 tablet (2 tablet Oral Given 01/24/12 1905)  fentaNYL (SUBLIMAZE) injection 100 mcg (100 mcg Nasal Given 01/24/12 1905)  HYDROmorphone (DILAUDID) injection 2 mg (2 mg Intramuscular Given 01/24/12 2134)      Labs  Reviewed - No data to display Dg Chest 2 View  01/24/2012  *RADIOLOGY REPORT*  Clinical Data: Pain post fall.  CHEST - 2 VIEW  Comparison: CT 11/10/2011  Findings: No pneumothorax. Lungs clear.  Heart size and pulmonary vascularity normal.  No effusion.  Visualized bones unremarkable.  IMPRESSION: No acute disease   Original Report Authenticated By: D. Andria Rhein, MD    Dg Pelvis 1-2 Views  01/24/2012  *RADIOLOGY REPORT*  Clinical Data: Pain post fall.  PELVIS - 1-2 VIEW  Comparison: CT 11/10/2011  Findings: Bilateral pelvic phleboliths. Negative for fracture, dislocation, or other acute abnormality.  Normal alignment and mineralization. No significant degenerative change.  Regional soft tissues unremarkable.  IMPRESSION:  Negative   Original Report Authenticated By: D. Andria Rhein, MD    Dg Shoulder Right  01/24/2012  *RADIOLOGY REPORT*  Clinical Data: Pain post fall.  RIGHT SHOULDER - 2+ VIEW  Comparison: 08/29/2009  Findings: Negative for fracture, dislocation, or other acute abnormality.  Normal alignment and mineralization. No significant degenerative change.  Regional soft tissues unremarkable.  IMPRESSION:  Negative   Original Report Authenticated By: D. Andria Rhein, MD    Dg Elbow Complete Right  01/24/2012  *RADIOLOGY REPORT*  Clinical Data: Pain post fall.  RIGHT ELBOW - COMPLETE 3+ VIEW  Comparison: None.  Findings: No effusion. Negative for fracture, dislocation, or other acute abnormality.  Normal alignment and mineralization. No significant degenerative change.  Regional soft tissues unremarkable.  IMPRESSION:  Negative   Original Report Authenticated By: D.  Andria Rhein, MD    Dg Forearm Right  01/24/2012  *RADIOLOGY REPORT*  Clinical Data: Pain post fall.  RIGHT FOREARM - 2 VIEW  Comparison: None.  Findings: Negative for fracture, dislocation, or other acute abnormality.  Normal alignment and mineralization. No significant degenerative change.  Regional soft tissues unremarkable.  IMPRESSION:  Negative   Original Report Authenticated By: D. Andria Rhein, MD    Dg Wrist Complete Left  01/24/2012  *RADIOLOGY REPORT*  Clinical Data: Pain post fall  LEFT WRIST - COMPLETE 3+ VIEW  Comparison: 05/21/2011 and earlier studies  Findings: Widening of the scapholunate space suggesting ligamentous injury, evident on films dating back to 12/28/2005.  No acute abnormality.  Regional soft tissues unremarkable.  Normal mineralization. No fracture.  IMPRESSION:  1.  Chronic widening of the scapholunate space suggesting ligamentous injury. 2.  No acute bony abnormality.   Original Report Authenticated By: D. Andria Rhein, MD    Dg Wrist Complete Right  01/24/2012  *RADIOLOGY REPORT*  Clinical Data: Pain post fall.  RIGHT WRIST - COMPLETE 3+ VIEW  Comparison: 08/04/2006  Findings:  There is mild widening of the scapholunate space. Negative for fracture, dislocation, or other acute bony abnormality.  Normal mineralization.  Regional soft tissues unremarkable.  IMPRESSION: 1.  Scapholunate widening suggesting ligamentous injury.   Original Report Authenticated By: D. Andria Rhein, MD    Ct Head Wo Contrast  01/24/2012  *RADIOLOGY REPORT*  Clinical Data: Headache and right-sided neck pain secondary to a fall from horse.  The horse then fell on her.  CT HEAD WITHOUT CONTRAST  Technique:  Contiguous axial images were obtained from the base of the skull through the vertex without contrast.  Comparison: CT scan dated 09/22/2011  Findings: There is no acute intracranial hemorrhage, infarction, or mass lesion.  Brain parenchyma is normal except for a faint area of white matter lucency in  the right frontal lobe adjacent to the frontal horn the right lateral  ventricle which probably represents a small focal area of chronic ischemic disease.  It is unchanged since prior exams.  Osseous structures are normal.  IMPRESSION: No acute abnormality.   Original Report Authenticated By: Francene Boyers, M.D.    Ct Cervical Spine Wo Contrast  01/24/2012  *RADIOLOGY REPORT*  Clinical Data: Neck pain secondary to a fall from horse with the horse then landing on the patient.  CT CERVICAL SPINE WITHOUT CONTRAST  Technique:  Multidetector CT imaging of the cervical spine was performed. Multiplanar CT image reconstructions were also generated.  Comparison: 09/22/2011  Findings: There is no fracture, subluxation, prevertebral soft tissue swelling, or arthritis. Broad-based disc bulges C5-6, unchanged.  IMPRESSION: No acute abnormality.  Broad-based disc bulge at C5-6, stable.   Original Report Authenticated By: Francene Boyers, M.D.    Dg Humerus Right  01/24/2012  *RADIOLOGY REPORT*  Clinical Data: Pain post fall.  RIGHT HUMERUS - 2+ VIEW  Comparison: 08/29/2009  Findings: Negative for fracture, dislocation, or other acute abnormality.  Normal alignment and mineralization. No significant degenerative change.  Regional soft tissues unremarkable.  IMPRESSION:  Negative   Original Report Authenticated By: D. Andria Rhein, MD    Dg Hand Complete Left  01/24/2012  *RADIOLOGY REPORT*  Clinical Data: Pain post fall.  LEFT HAND - COMPLETE 3+ VIEW  Comparison: 05/21/2011  Findings: Negative for fracture, dislocation, or other acute abnormality.  Normal alignment and mineralization. No significant degenerative change.  Regional soft tissues unremarkable.  IMPRESSION:  Negative   Original Report Authenticated By: D. Andria Rhein, MD    Dg Hand Complete Right  01/24/2012  *RADIOLOGY REPORT*  Clinical Data: Pain post fall.  RIGHT HAND - COMPLETE 3+ VIEW  Comparison: 08/04/2006  Findings: Negative for fracture, dislocation,  or other acute abnormality.  Normal alignment and mineralization. No significant degenerative change.  Regional soft tissues unremarkable.  IMPRESSION:  Negative   Original Report Authenticated By: D. Andria Rhein, MD      1. Minor head injury   2. Cervical strain   3. Left wrist sprain   4. Right wrist sprain   5. Contusion of right arm   6. Contusion of hip, left   7. Contusion, chest wall      MDM  I personally performed the services described in this documentation, which was scribed in my presence. The recorded information has been reviewed and is accurate.  Patient / Family / Caregiver informed of clinical course, understand medical decision-making process, and agree with plan.I doubt any other EMC precluding discharge at this time including, but not necessarily limited to the following:[facility].   Hurman Horn, MD 01/25/12 [phone]

## 2012-01-27 ENCOUNTER — Emergency Department (HOSPITAL_COMMUNITY)
Admission: EM | Admit: 2012-01-27 | Discharge: 2012-01-27 | Disposition: A | Discharge status: Home / Self Care | Payer: Medicaid Other | Attending: Emergency Medicine | Admitting: Emergency Medicine

## 2012-01-27 ENCOUNTER — Encounter (HOSPITAL_COMMUNITY): Payer: Self-pay | Admitting: *Deleted

## 2012-01-27 ENCOUNTER — Emergency Department (HOSPITAL_COMMUNITY): Payer: Medicaid Other

## 2012-01-27 DIAGNOSIS — G8929 Other chronic pain: Secondary | ICD-10-CM | POA: Insufficient documentation

## 2012-01-27 DIAGNOSIS — R109 Unspecified abdominal pain: Secondary | ICD-10-CM

## 2012-01-27 DIAGNOSIS — R911 Solitary pulmonary nodule: Secondary | ICD-10-CM | POA: Insufficient documentation

## 2012-01-27 DIAGNOSIS — F17200 Nicotine dependence, unspecified, uncomplicated: Secondary | ICD-10-CM | POA: Insufficient documentation

## 2012-01-27 DIAGNOSIS — Z8739 Personal history of other diseases of the musculoskeletal system and connective tissue: Secondary | ICD-10-CM | POA: Insufficient documentation

## 2012-01-27 DIAGNOSIS — I10 Essential (primary) hypertension: Secondary | ICD-10-CM | POA: Insufficient documentation

## 2012-01-27 DIAGNOSIS — N83209 Unspecified ovarian cyst, unspecified side: Secondary | ICD-10-CM | POA: Insufficient documentation

## 2012-01-27 DIAGNOSIS — R112 Nausea with vomiting, unspecified: Secondary | ICD-10-CM | POA: Insufficient documentation

## 2012-01-27 DIAGNOSIS — Z862 Personal history of diseases of the blood and blood-forming organs and certain disorders involving the immune mechanism: Secondary | ICD-10-CM | POA: Insufficient documentation

## 2012-01-27 DIAGNOSIS — Z79899 Other long term (current) drug therapy: Secondary | ICD-10-CM | POA: Insufficient documentation

## 2012-01-27 DIAGNOSIS — Z8659 Personal history of other mental and behavioral disorders: Secondary | ICD-10-CM | POA: Insufficient documentation

## 2012-01-27 LAB — CBC WITH DIFFERENTIAL/PLATELET
Eosinophils Absolute: 0.2 10*3/uL (ref 0.0–0.7)
Eosinophils Relative: 4 % (ref 0–5)
HCT: 33.5 % — ABNORMAL LOW (ref 36.0–46.0)
Hemoglobin: 11.3 g/dL — ABNORMAL LOW (ref 12.0–15.0)
Lymphocytes Relative: 28 % (ref 12–46)
Lymphs Abs: 1.4 10*3/uL (ref 0.7–4.0)
MCH: 30.4 pg (ref 26.0–34.0)
MCV: 90.1 fL (ref 78.0–100.0)
Monocytes Relative: 8 % (ref 3–12)
RBC: 3.72 MIL/uL — ABNORMAL LOW (ref 3.87–5.11)
WBC: 5.1 10*3/uL (ref 4.0–10.5)

## 2012-01-27 LAB — COMPREHENSIVE METABOLIC PANEL
ALT: 13 U/L (ref 0–35)
Alkaline Phosphatase: 70 U/L (ref 39–117)
BUN: 9 mg/dL (ref 6–23)
CO2: 26 mEq/L (ref 19–32)
Calcium: 9.2 mg/dL (ref 8.4–10.5)
GFR calc Af Amer: >90 mL/min (ref >90)
GFR calc non Af Amer: >90 mL/min (ref >90)
Glucose, Bld: 90 mg/dL (ref 70–99)
Potassium: 3.9 mEq/L (ref 3.5–5.1)
Sodium: 139 mEq/L (ref 135–145)
Total Protein: 6.7 g/dL (ref 6.0–8.3)

## 2012-01-27 LAB — URINALYSIS, ROUTINE W REFLEX MICROSCOPIC
Bilirubin Urine: NEGATIVE
Ketones, ur: NEGATIVE mg/dL
Leukocytes, UA: NEGATIVE
Nitrite: NEGATIVE
Urobilinogen, UA: 0.2 mg/dL (ref 0.0–1.0)
pH: 7 (ref 5.0–8.0)

## 2012-01-27 MED ORDER — HYDROMORPHONE HCL PF 1 MG/ML IJ SOLN
1.0000 mg | Freq: Once | INTRAMUSCULAR | Status: AC
  Administered 2012-01-27: 1 mg via INTRAVENOUS
  Filled 2012-01-27: qty 1

## 2012-01-27 MED ORDER — ACETAMINOPHEN-CODEINE #3 300-30 MG PO TABS: 1.0000 | ORAL_TABLET | Freq: Four times a day (QID) | ORAL | Status: DC | PRN

## 2012-01-27 MED ORDER — PROMETHAZINE HCL 25 MG PO TABS: 25.0000 mg | ORAL_TABLET | Freq: Four times a day (QID) | ORAL | Status: DC | PRN

## 2012-01-27 MED ORDER — ONDANSETRON HCL 4 MG/2ML IJ SOLN
4.0000 mg | Freq: Once | INTRAMUSCULAR | Status: AC
  Administered 2012-01-27: 4 mg via INTRAVENOUS
  Filled 2012-01-27: qty 2

## 2012-01-27 MED ORDER — SODIUM CHLORIDE 0.9 % IV BOLUS (SEPSIS)
1000.0000 mL | Freq: Once | INTRAVENOUS | Status: AC
  Administered 2012-01-27: 1000 mL via INTRAVENOUS

## 2012-01-27 MED ORDER — IOHEXOL 300 MG/ML  SOLN
100.0000 mL | Freq: Once | INTRAMUSCULAR | Status: AC | PRN
  Administered 2012-01-27: 100 mL via INTRAVENOUS

## 2012-01-27 MED ORDER — IOHEXOL 300 MG/ML  SOLN
50.0000 mL | Freq: Once | INTRAMUSCULAR | Status: AC | PRN
  Administered 2012-01-27: 50 mL via ORAL

## 2012-01-27 NOTE — ED Provider Notes (Signed)
History     CSN: [id number]  Arrival date & time 01/27/12  1226   First MD Initiated Contact with Patient 01/27/12 1340      Chief Complaint  Patient presents with  . Abdominal Pain    (Consider location/radiation/quality/duration/timing/severity/associated sxs/prior treatment) HPI  47 yo f seen for fall from horse 1/13 returns to ED w/co worsening neck pain and severe rlq abdominal pain. Patient states that she awoke Monday morning with severe pain.  She states that pain is constant and colicky.  SHe describes it as "the worst pajn of her life." She has associated N/V since last night.  Last BM this morning without diarrhea.  The patient denies urinary or vaginal sxs. She is not sexually active.  She has PMH RA.  There is a known bulging disk at c5/6.   Past Medical History  Diagnosis Date  . Rheumatoid arthritis   . Chronic anemia   . Hypertension   . Arthritis, rheumatoid   . Incidental lung nodule   . Depression   . Chronic pain   . Chronic back pain     Past Surgical History  Procedure Date  . Tubal ligation   . Endometrial ablation   . Breast cyst excision 12/31/2010    Procedure: CYST EXCISION BREAST;  Surgeon: Fabio Bering, MD;  Location: [facility];  Service: General;  Laterality: Right;  Excision Sebaceous Cyst Right Breast    Family History  Problem Relation Age of Onset  . Anesthesia problems Neg Hx   . Cancer Father     History  Substance Use Topics  . Smoking status: Current Every Day Smoker -- 0.0 packs/day for 30 years    Types: Cigarettes  . Smokeless tobacco: Never Used  . Alcohol Use: No     Comment: occassional    OB History    Grav Para Term Preterm Abortions TAB SAB Ect Mult Living   6 5 5  1  1   5       Review of Systems Ten systems reviewed and are negative for acute change, except as noted in the HPI.   Allergies  Review of patient's allergies indicates no known allergies.  Home Medications   Current Outpatient Rx  Name   Route  Sig  Dispense  Refill  . CALCIUM 600 + D PO   Oral   Take 1 tablet by mouth every evening.          Marland Kitchen FERROUS SULFATE 325 (65 FE) MG PO TABS   Oral   Take 325 mg by mouth 2 (two) times daily. For anemia          . FOLIC ACID 1 MG PO TABS   Oral   Take 1 mg by mouth daily.           Marland Kitchen HYDROXYCHLOROQUINE SULFATE 200 MG PO TABS   Oral   Take 200 mg by mouth daily.         Marland Kitchen METHOTREXATE 2.5 MG PO TABS   Oral   Take 12.5 mg by mouth once a week. Take 5 tablets every Monday and Tuesday :Caution:Chemotherapy. Protect from light.         . ADULT MULTIVITAMIN W/MINERALS CH   Oral   Take 1 tablet by mouth daily.         . OXYCODONE-ACETAMINOPHEN 5-325 MG PO TABS   Oral   Take 2 tablets by mouth every 6 (six) hours as needed for pain.   20 tablet  0     BP 125/84  Pulse 90  Temp 98.2 F (36.8 C) (Oral)  Resp 18  Ht 5\' 6"  (1.676 m)  Wt 150 lb (68.04 kg)  BMI 24.21 kg/m2  SpO2 99%  Physical Exam Physical Exam  Nursing note and vitals reviewed. Constitutional: She is oriented to person, place, and time. She appears well-developed and well-nourished. No distress.  HENT:  Head: Normocephalic and atraumatic.  Eyes: Conjunctivae normal and EOM are normal. Pupils are equal, round, and reactive to light. No scleral icterus.  Neck: Normal range of motion.  Cardiovascular: Normal rate, regular rhythm and normal heart sounds.  Exam reveals no gallop and no friction rub.   No murmur heard. Pulmonary/Chest: Effort normal and breath sounds normal. No respiratory distress.  Abdominal: Patient is exquisetly ttp in RLQ . She has Postitve peritoneal signs with heal strikes on Right. Neurological: She is alert and oriented to person, place, and time.  Skin: Skin is warm and dry. She is not diaphoretic.    ED Course  Procedures (including critical care time)  Labs Reviewed  CBC WITH DIFFERENTIAL - Abnormal; Notable for the following:    RBC 3.72 (*)      Hemoglobin 11.3 (*)     HCT 33.5 (*)     Platelets 412 (*)     All other components within normal limits  COMPREHENSIVE METABOLIC PANEL - Abnormal; Notable for the following:    Total Bilirubin 0.1 (*)     All other components within normal limits  URINALYSIS, ROUTINE W REFLEX MICROSCOPIC  POCT PREGNANCY, URINE   No results found.   No diagnosis found.    MDM  4:24 PM I suspect that patient neck/ arm pain is an exacerbation of existing disk protrusion. I am concerned for possible appendicitis.  I doubt ovarian torsion or PID.   I have given report to PA Beverely Pace who will assume care of the patient.        Arthor Captain, PA-C 01/27/12 1626

## 2012-01-27 NOTE — ED Notes (Signed)
Seen here 1/12 after fell off horse. Cont to have abd,pain, neck pain, arms "tingling".  Has splints on both wrists. Vomiting,

## 2012-01-27 NOTE — ED Notes (Signed)
Pt presents c/o right lower abdominal pain. Pt states fell from horse on Sunday and now her back, neck and abdominal pain. Pt has bilateral thumb spica splints on and intact. Pt  Reports is having difficulty raising arms up.  No deformities noted. NAD noted

## 2012-01-28 NOTE — ED Provider Notes (Signed)
Medical screening examination/treatment/procedure(s) were performed by non-physician practitioner and as supervising physician I was immediately available for consultation/collaboration.   Shelda Jakes, MD 01/28/12 [phone]

## 2012-03-01 ENCOUNTER — Emergency Department (HOSPITAL_COMMUNITY): Payer: Medicaid Other

## 2012-03-01 ENCOUNTER — Encounter (HOSPITAL_COMMUNITY): Payer: Self-pay | Admitting: *Deleted

## 2012-03-01 ENCOUNTER — Emergency Department (HOSPITAL_COMMUNITY)
Admission: EM | Admit: 2012-03-01 | Discharge: 2012-03-01 | Disposition: A | Discharge status: Home / Self Care | Payer: Medicaid Other | Attending: Emergency Medicine | Admitting: Emergency Medicine

## 2012-03-01 DIAGNOSIS — W1809XA Striking against other object with subsequent fall, initial encounter: Secondary | ICD-10-CM | POA: Insufficient documentation

## 2012-03-01 DIAGNOSIS — W010XXA Fall on same level from slipping, tripping and stumbling without subsequent striking against object, initial encounter: Secondary | ICD-10-CM | POA: Insufficient documentation

## 2012-03-01 DIAGNOSIS — S0993XA Unspecified injury of face, initial encounter: Secondary | ICD-10-CM | POA: Insufficient documentation

## 2012-03-01 DIAGNOSIS — I10 Essential (primary) hypertension: Secondary | ICD-10-CM | POA: Insufficient documentation

## 2012-03-01 DIAGNOSIS — Z8659 Personal history of other mental and behavioral disorders: Secondary | ICD-10-CM | POA: Insufficient documentation

## 2012-03-01 DIAGNOSIS — Y9389 Activity, other specified: Secondary | ICD-10-CM | POA: Insufficient documentation

## 2012-03-01 DIAGNOSIS — M069 Rheumatoid arthritis, unspecified: Secondary | ICD-10-CM | POA: Insufficient documentation

## 2012-03-01 DIAGNOSIS — W108XXA Fall (on) (from) other stairs and steps, initial encounter: Secondary | ICD-10-CM | POA: Insufficient documentation

## 2012-03-01 DIAGNOSIS — Z79899 Other long term (current) drug therapy: Secondary | ICD-10-CM | POA: Insufficient documentation

## 2012-03-01 DIAGNOSIS — Z8709 Personal history of other diseases of the respiratory system: Secondary | ICD-10-CM | POA: Insufficient documentation

## 2012-03-01 DIAGNOSIS — S199XXA Unspecified injury of neck, initial encounter: Secondary | ICD-10-CM | POA: Insufficient documentation

## 2012-03-01 DIAGNOSIS — Y9289 Other specified places as the place of occurrence of the external cause: Secondary | ICD-10-CM | POA: Insufficient documentation

## 2012-03-01 DIAGNOSIS — S46919A Strain of unspecified muscle, fascia and tendon at shoulder and upper arm level, unspecified arm, initial encounter: Secondary | ICD-10-CM

## 2012-03-01 DIAGNOSIS — D649 Anemia, unspecified: Secondary | ICD-10-CM | POA: Insufficient documentation

## 2012-03-01 DIAGNOSIS — IMO0002 Reserved for concepts with insufficient information to code with codable children: Secondary | ICD-10-CM | POA: Insufficient documentation

## 2012-03-01 DIAGNOSIS — S6990XA Unspecified injury of unspecified wrist, hand and finger(s), initial encounter: Secondary | ICD-10-CM | POA: Insufficient documentation

## 2012-03-01 DIAGNOSIS — Z8739 Personal history of other diseases of the musculoskeletal system and connective tissue: Secondary | ICD-10-CM | POA: Insufficient documentation

## 2012-03-01 DIAGNOSIS — F17200 Nicotine dependence, unspecified, uncomplicated: Secondary | ICD-10-CM | POA: Insufficient documentation

## 2012-03-01 MED ORDER — KETOROLAC TROMETHAMINE 60 MG/2ML IM SOLN
60.0000 mg | Freq: Once | INTRAMUSCULAR | Status: AC
  Administered 2012-03-01: 60 mg via INTRAMUSCULAR
  Filled 2012-03-01: qty 2

## 2012-03-01 MED ORDER — HYDROCODONE-ACETAMINOPHEN 5-325 MG PO TABS: 1.0000 | ORAL_TABLET | ORAL | Status: DC | PRN

## 2012-03-01 NOTE — ED Notes (Addendum)
Pt slipped on ice coming down steps, had grabbed  Rail with lt arm , Now pain lt shoulder and down lt arm.  Alert, No LOC.  Good radial pulse.

## 2012-03-01 NOTE — ED Provider Notes (Signed)
History     CSN: [id number]  Arrival date & time 03/01/12  1037   First MD Initiated Contact with Patient 03/01/12 1053      Chief Complaint  Patient presents with  . Fall     HPI [name] is a 47 y.o. female who presents to the ED with arm pain. The pain started yesterday. After shoveling snow was going down the steps and slipped and grabbed the rail with left hand. Began having pain in the left shoulder with radiation to the arm. Pain increases with movement. Nothing makes it better. Taking BC powder without relief. She denies nausea or vomiting. The history was provided by the patient.   Past Medical History  Diagnosis Date  . Rheumatoid arthritis   . Chronic anemia   . Hypertension   . Arthritis, rheumatoid   . Incidental lung nodule   . Depression   . Chronic pain   . Chronic back pain     Past Surgical History  Procedure Laterality Date  . Tubal ligation    . Endometrial ablation    . Breast cyst excision  12/31/2010    Procedure: CYST EXCISION BREAST;  Surgeon: Fabio Bering, MD;  Location: [facility];  Service: General;  Laterality: Right;  Excision Sebaceous Cyst Right Breast    Family History  Problem Relation Age of Onset  . Anesthesia problems Neg Hx   . Cancer Father     History  Substance Use Topics  . Smoking status: Current Every Day Smoker -- 0.03 packs/day for 30 years    Types: Cigarettes  . Smokeless tobacco: Never Used  . Alcohol Use: No     Comment: occassional    OB History   Grav Para Term Preterm Abortions TAB SAB Ect Mult Living   6 5 5  1  1   5       Review of Systems  Constitutional: Negative for fever, chills, diaphoresis and fatigue.  HENT: Positive for neck pain. Negative for ear pain, congestion, sore throat, facial swelling, dental problem and sinus pressure.   Eyes: Negative for photophobia, pain, discharge and visual disturbance.  Respiratory: Negative for cough, chest tightness and wheezing.   Cardiovascular:  Negative for chest pain.  Gastrointestinal: Negative for nausea, vomiting, abdominal pain, diarrhea, constipation and abdominal distention.  Genitourinary: Negative for dysuria, frequency, flank pain and difficulty urinating.  Musculoskeletal: Negative for myalgias, back pain and gait problem.       Shoulder and arm pain.  Skin: Negative for color change and rash.  Neurological: Negative for dizziness, speech difficulty, weakness, light-headedness, numbness and headaches.  Psychiatric/Behavioral: Negative for confusion and agitation.    Allergies  Codeine  Home Medications   Current Outpatient Rx  Name  Route  Sig  Dispense  Refill  . Calcium Carbonate-Vitamin D (CALCIUM 600 + D PO)   Oral   Take 1 tablet by mouth every evening.          . ferrous sulfate 325 (65 FE) MG tablet   Oral   Take 325 mg by mouth 2 (two) times daily. For anemia          . folic acid (FOLVITE) 1 MG tablet   Oral   Take 1 mg by mouth daily.           . hydroxychloroquine (PLAQUENIL) 200 MG tablet   Oral   Take 200 mg by mouth daily.         . methotrexate (RHEUMATREX)  2.5 MG tablet   Oral   Take 12.5 mg by mouth once a week. Take 5 tablets every Monday and Tuesday :Caution:Chemotherapy. Protect from light.         . Multiple Vitamin (MULTIVITAMIN WITH MINERALS) TABS   Oral   Take 1 tablet by mouth daily.           BP 126/93  Pulse 94  Temp(Src) 97.4 F (36.3 C) (Oral)  Ht 5\' 6"  (1.676 m)  Wt 150 lb (68.04 kg)  BMI 24.22 kg/m2  SpO2 100%  Physical Exam  Nursing note and vitals reviewed. Constitutional: She is oriented to person, place, and time. She appears well-developed and well-nourished. No distress.  HENT:  Head: Normocephalic and atraumatic.  Eyes: EOM are normal. Pupils are equal, round, and reactive to light.  Neck: Normal range of motion. Neck supple. Muscular tenderness present.  Cardiovascular: Normal rate and regular rhythm.   Pulmonary/Chest: No respiratory  distress. She has no wheezes.  Abdominal: Soft. There is no tenderness.  Musculoskeletal:       Left shoulder: She exhibits decreased range of motion and tenderness. She exhibits no deformity, no spasm and normal strength.       Arms: Radial pulse present, adequate circulation. Good touch sensation. Good strength.  Neurological: She is alert and oriented to person, place, and time. No cranial nerve deficit.  Skin: Skin is warm and dry.  Psychiatric: She has a normal mood and affect. Her behavior is normal. Judgment and thought content normal.   Procedures  Dg Shoulder Left  03/01/2012  *RADIOLOGY REPORT*  Clinical Data: Pain post trauma  LEFT SHOULDER - 2+ VIEW  Comparison: None.  Findings: Internal rotation, external rotation, and Y scapular views were obtained.  There is no fracture or dislocation.  Joint spaces appear intact.  No erosive change or intra-articular calcifications.  IMPRESSION: No abnormality noted.   Original Report Authenticated By: Bretta Bang, M.D.     Assessment: 47 y.o. female with muscle strain to left shoulder  Plan:  Pain management, follow up with ortho Discussed with the patient and all questioned fully answered.    Medication List    TAKE these medications       HYDROcodone-acetaminophen 5-325 MG per tablet  Commonly known as:  NORCO/VICODIN  Take 1 tablet by mouth every 4 (four) hours as needed for pain.      ASK your doctor about these medications       CALCIUM 600 + D PO  Take 1 tablet by mouth every evening.     ferrous sulfate 325 (65 FE) MG tablet  Take 325 mg by mouth 2 (two) times daily. For anemia     folic acid 1 MG tablet  Commonly known as:  FOLVITE  Take 1 mg by mouth daily.     hydroxychloroquine 200 MG tablet  Commonly known as:  PLAQUENIL  Take 200 mg by mouth daily.     methotrexate 2.5 MG tablet  Commonly known as:  RHEUMATREX  Take 12.5 mg by mouth once a week. Take 5 tablets every Monday and Tuesday  :Caution:Chemotherapy. Protect from light.     multivitamin with minerals Tabs  Take 1 tablet by mouth daily.           [place], NP 03/01/12 [address] [place], [place] 03/01/12 1309

## 2012-03-02 NOTE — ED Provider Notes (Signed)
Medical screening examination/treatment/procedure(s) were performed by non-physician practitioner and as supervising physician I was immediately available for consultation/collaboration.  Avary Pitsenbarger, MD 03/02/12 0716 

## 2012-05-17 IMAGING — US US PELVIS COMPLETE
1 series · 14 of 25 positions shown · non-contrast
Comparison: 01/22/2004.

CLINICAL DATA: Vaginal bleeding.



[Series 1: us pelvis complete · 0.23mm/px · 14 of 56 slices shown]
[im 1/56]
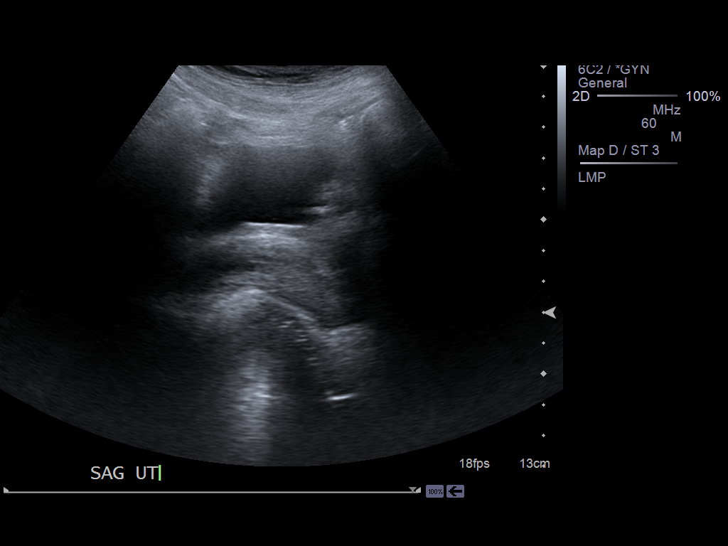
[im 5/56]
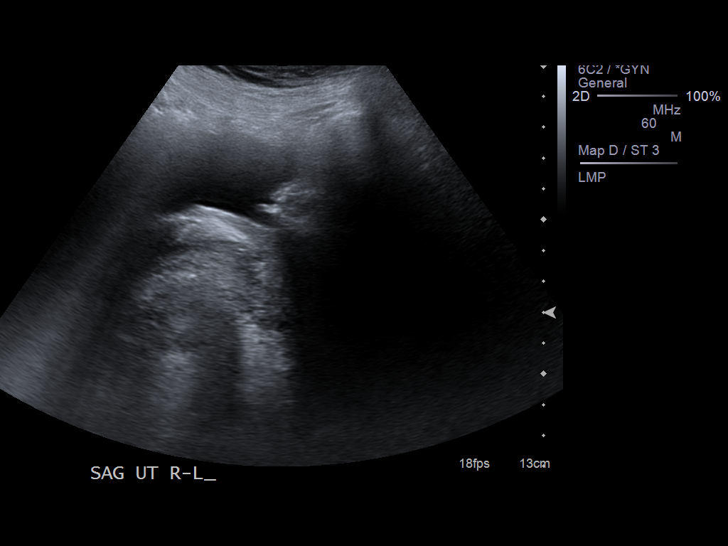
[im 10/56]
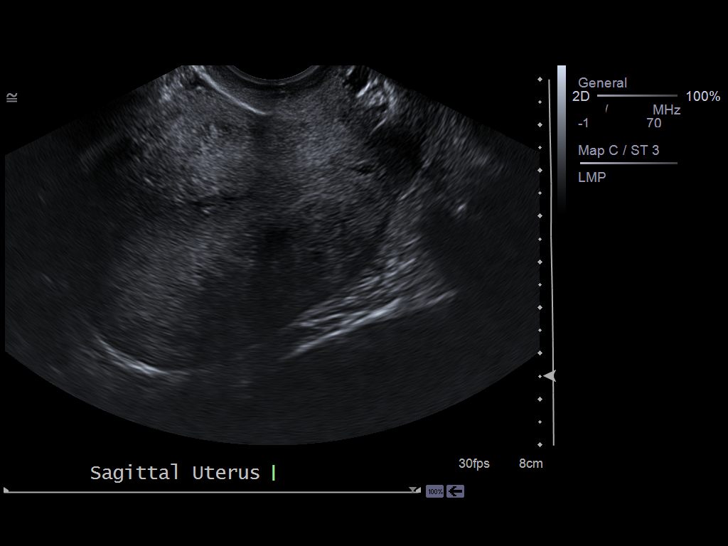
[im 14/56]
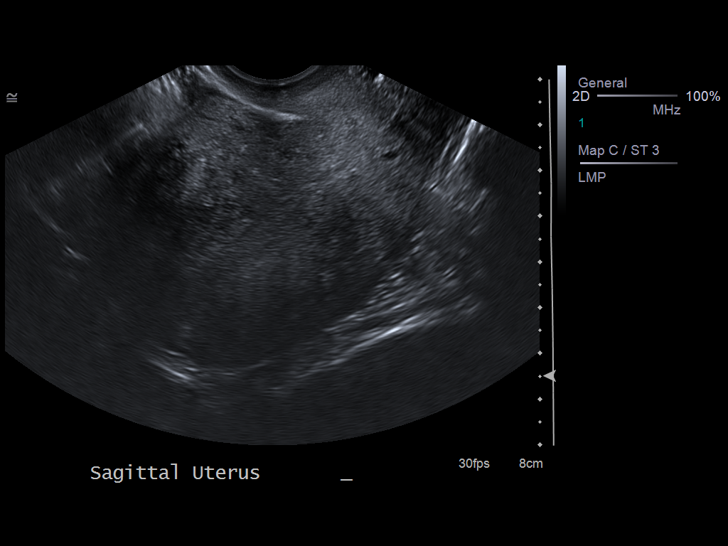
[im 19/56]
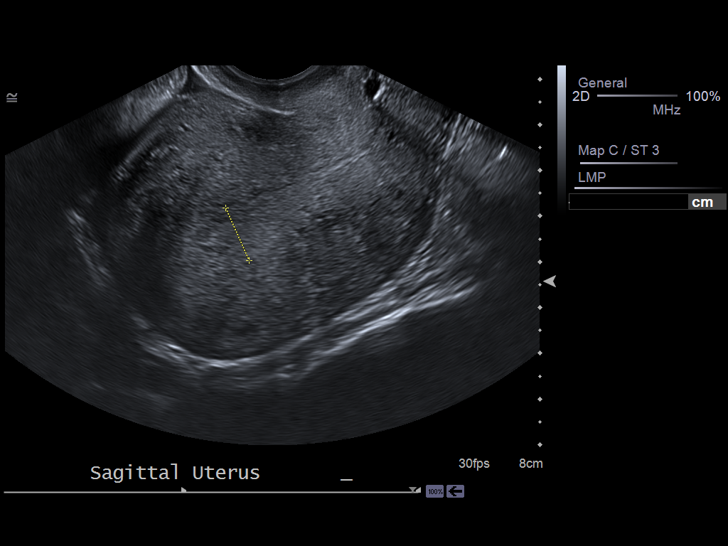
[im 21/56]
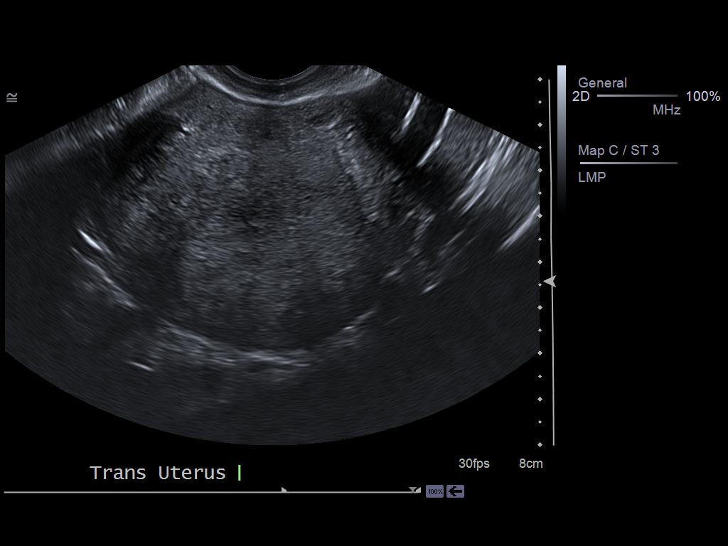
[im 26/56]
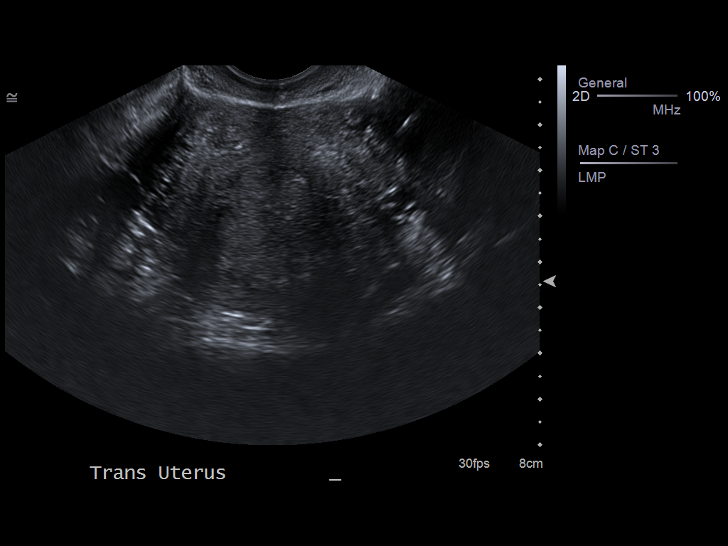
[im 30/56]
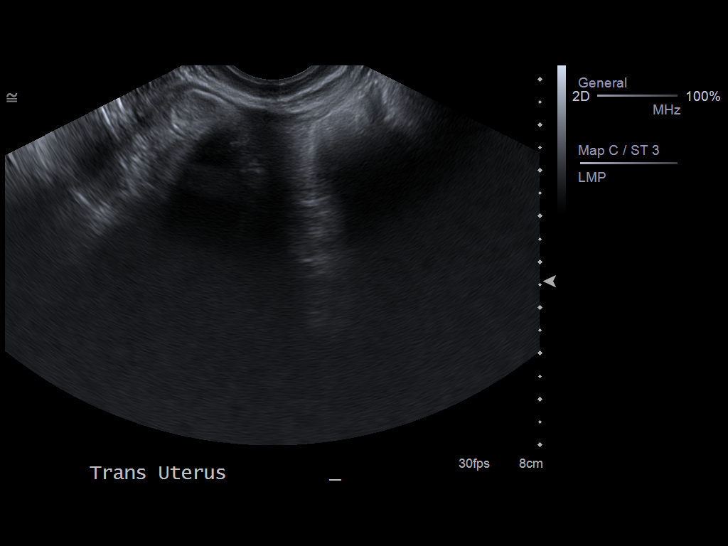
[im 35/56]
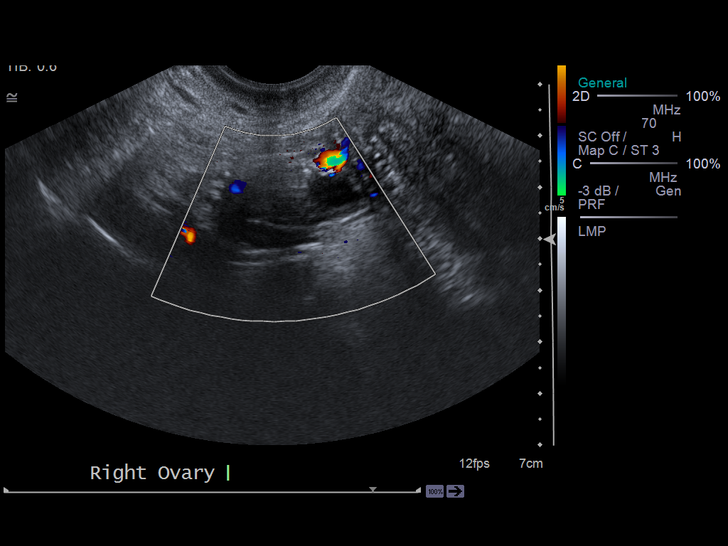
[im 37/56]
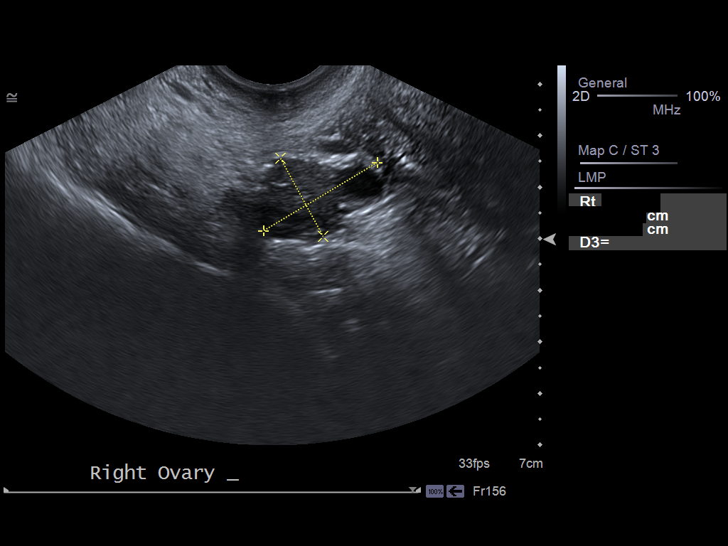
[im 42/56]
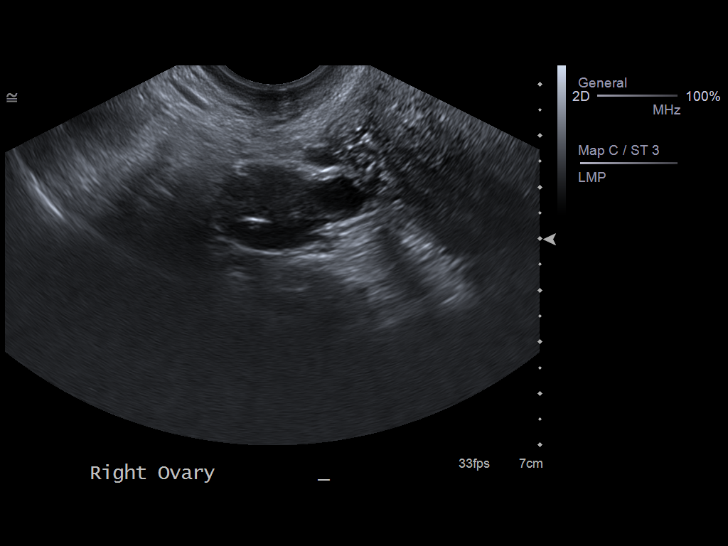
[im 46/56]
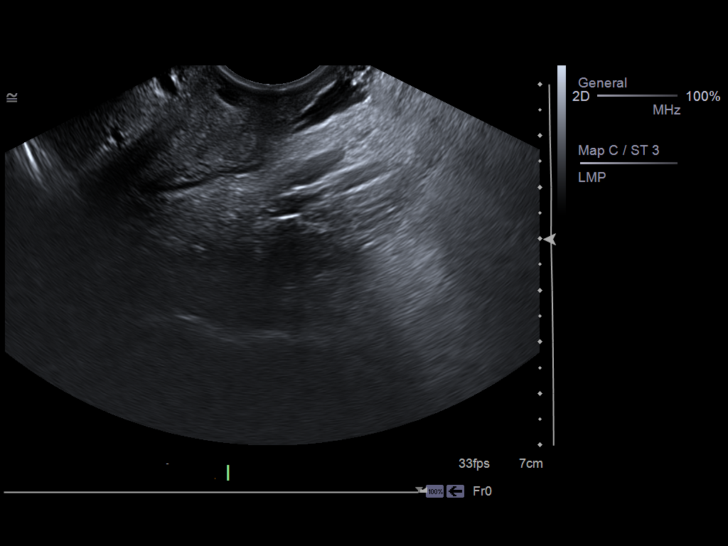
[im 51/56]
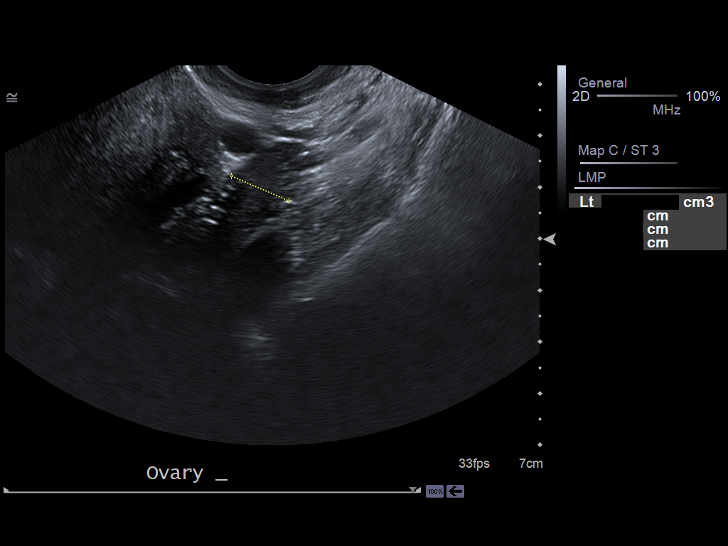
[im 56/56]
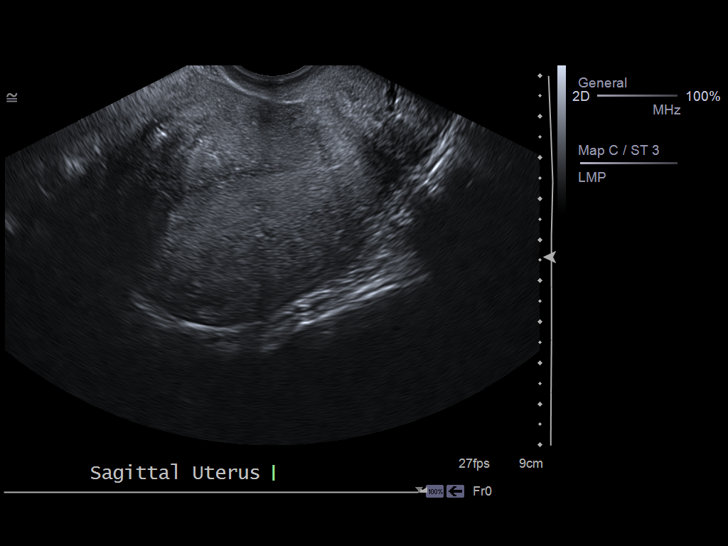

[14 of 25 positions shown; findings below may reference images not displayed]

FINDINGS: Uterus:  9.1 x 5.3 x 6.4.  No evidence for uterine fibroids.
Myometrium is heterogeneous, raising the question of adenomyosis.

Endometrium:  Poorly defined.  Approximately 13 mm in thickness.

Right ovary:  2.6 x 1.7 x 2.2 cm.  Normal appearance/no adnexal
mass.

Left ovary:  1.9 x 1.4 x 1.2 cm.  Normal appearance/no adnexal
mass.

Other findings:
IMPRESSION: Question of adenomyosis.  Otherwise unremarkable study.

## 2012-05-27 ENCOUNTER — Emergency Department (HOSPITAL_COMMUNITY)
Admission: EM | Admit: 2012-05-27 | Discharge: 2012-05-27 | Disposition: A | Discharge status: Home / Self Care | Payer: Self-pay | Attending: Emergency Medicine | Admitting: Emergency Medicine

## 2012-05-27 ENCOUNTER — Encounter (HOSPITAL_COMMUNITY): Payer: Self-pay | Admitting: *Deleted

## 2012-05-27 DIAGNOSIS — M791 Myalgia, unspecified site: Secondary | ICD-10-CM

## 2012-05-27 DIAGNOSIS — G8929 Other chronic pain: Secondary | ICD-10-CM | POA: Insufficient documentation

## 2012-05-27 DIAGNOSIS — I10 Essential (primary) hypertension: Secondary | ICD-10-CM | POA: Insufficient documentation

## 2012-05-27 DIAGNOSIS — D649 Anemia, unspecified: Secondary | ICD-10-CM | POA: Insufficient documentation

## 2012-05-27 DIAGNOSIS — IMO0001 Reserved for inherently not codable concepts without codable children: Secondary | ICD-10-CM | POA: Insufficient documentation

## 2012-05-27 DIAGNOSIS — M2550 Pain in unspecified joint: Secondary | ICD-10-CM

## 2012-05-27 DIAGNOSIS — Z8709 Personal history of other diseases of the respiratory system: Secondary | ICD-10-CM | POA: Insufficient documentation

## 2012-05-27 DIAGNOSIS — Z3202 Encounter for pregnancy test, result negative: Secondary | ICD-10-CM | POA: Insufficient documentation

## 2012-05-27 DIAGNOSIS — M069 Rheumatoid arthritis, unspecified: Secondary | ICD-10-CM | POA: Insufficient documentation

## 2012-05-27 DIAGNOSIS — Z79899 Other long term (current) drug therapy: Secondary | ICD-10-CM | POA: Insufficient documentation

## 2012-05-27 DIAGNOSIS — F17200 Nicotine dependence, unspecified, uncomplicated: Secondary | ICD-10-CM | POA: Insufficient documentation

## 2012-05-27 LAB — CBC WITH DIFFERENTIAL/PLATELET
Basophils Absolute: 0 10*3/uL (ref 0.0–0.1)
HCT: 33.8 % — ABNORMAL LOW (ref 36.0–46.0)
Hemoglobin: 11.1 g/dL — ABNORMAL LOW (ref 12.0–15.0)
Lymphocytes Relative: 22 % (ref 12–46)
Monocytes Absolute: 0.5 10*3/uL (ref 0.1–1.0)
Neutro Abs: 4 10*3/uL (ref 1.7–7.7)
RBC: 3.95 MIL/uL (ref 3.87–5.11)
RDW: 15.2 % (ref 11.5–15.5)
WBC: 6.1 10*3/uL (ref 4.0–10.5)

## 2012-05-27 LAB — BASIC METABOLIC PANEL
CO2: 28 mEq/L (ref 19–32)
Chloride: 103 mEq/L (ref 96–112)
Creatinine, Ser: 0.51 mg/dL (ref 0.50–1.10)
Sodium: 139 mEq/L (ref 135–145)

## 2012-05-27 LAB — URINALYSIS, ROUTINE W REFLEX MICROSCOPIC
Glucose, UA: NEGATIVE mg/dL
Leukocytes, UA: NEGATIVE
pH: 5.5 (ref 5.0–8.0)

## 2012-05-27 LAB — URINE MICROSCOPIC-ADD ON

## 2012-05-27 MED ORDER — OXYCODONE-ACETAMINOPHEN 5-325 MG PO TABS: 1.0000 | ORAL_TABLET | ORAL | Status: DC | PRN

## 2012-05-27 MED ORDER — OXYCODONE-ACETAMINOPHEN 5-325 MG PO TABS
2.0000 | ORAL_TABLET | Freq: Once | ORAL | Status: AC
  Administered 2012-05-27: 2 via ORAL
  Filled 2012-05-27: qty 2

## 2012-05-27 MED ORDER — SODIUM CHLORIDE 0.9 % IV SOLN
INTRAVENOUS | Status: DC
  Administered 2012-05-27: 16:00:00 via INTRAVENOUS

## 2012-05-27 NOTE — ED Notes (Addendum)
Says she cannot urinate, feels weak,  Body aches.

## 2012-05-27 NOTE — ED Notes (Signed)
Pt was not dizzy at any time had no complaints. [name]
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
## 2012-05-27 NOTE — ED Notes (Signed)
Pt ambulated to restroom at this time with minimal assistance. Adrienna Karis 

## 2012-05-29 LAB — URINE CULTURE: Colony Count: NO GROWTH

## 2012-07-02 ENCOUNTER — Encounter (HOSPITAL_COMMUNITY): Payer: Self-pay

## 2012-07-02 ENCOUNTER — Emergency Department (HOSPITAL_COMMUNITY): Payer: Self-pay

## 2012-07-02 ENCOUNTER — Emergency Department (HOSPITAL_COMMUNITY)
Admission: EM | Admit: 2012-07-02 | Discharge: 2012-07-02 | Disposition: A | Discharge status: Home / Self Care | Payer: Self-pay | Attending: Emergency Medicine | Admitting: Emergency Medicine

## 2012-07-02 DIAGNOSIS — I10 Essential (primary) hypertension: Secondary | ICD-10-CM | POA: Insufficient documentation

## 2012-07-02 DIAGNOSIS — F17200 Nicotine dependence, unspecified, uncomplicated: Secondary | ICD-10-CM | POA: Insufficient documentation

## 2012-07-02 DIAGNOSIS — Z8659 Personal history of other mental and behavioral disorders: Secondary | ICD-10-CM | POA: Insufficient documentation

## 2012-07-02 DIAGNOSIS — T07XXXA Unspecified multiple injuries, initial encounter: Secondary | ICD-10-CM | POA: Insufficient documentation

## 2012-07-02 DIAGNOSIS — D649 Anemia, unspecified: Secondary | ICD-10-CM | POA: Insufficient documentation

## 2012-07-02 DIAGNOSIS — G8929 Other chronic pain: Secondary | ICD-10-CM | POA: Insufficient documentation

## 2012-07-02 DIAGNOSIS — Z79899 Other long term (current) drug therapy: Secondary | ICD-10-CM | POA: Insufficient documentation

## 2012-07-02 DIAGNOSIS — Z23 Encounter for immunization: Secondary | ICD-10-CM | POA: Insufficient documentation

## 2012-07-02 DIAGNOSIS — Z8709 Personal history of other diseases of the respiratory system: Secondary | ICD-10-CM | POA: Insufficient documentation

## 2012-07-02 DIAGNOSIS — M069 Rheumatoid arthritis, unspecified: Secondary | ICD-10-CM | POA: Insufficient documentation

## 2012-07-02 MED ORDER — OXYCODONE-ACETAMINOPHEN 5-325 MG PO TABS: 1.0000 | ORAL_TABLET | ORAL | Status: DC | PRN

## 2012-07-02 MED ORDER — OXYCODONE-ACETAMINOPHEN 5-325 MG PO TABS
1.0000 | ORAL_TABLET | Freq: Once | ORAL | Status: AC
  Administered 2012-07-02: 1 via ORAL
  Filled 2012-07-02: qty 1

## 2012-07-02 MED ORDER — TETANUS-DIPHTH-ACELL PERTUSSIS 5-2.5-18.5 LF-MCG/0.5 IM SUSP
0.5000 mL | Freq: Once | INTRAMUSCULAR | Status: AC
  Administered 2012-07-02: 0.5 mL via INTRAMUSCULAR
  Filled 2012-07-02: qty 0.5

## 2012-07-02 MED ORDER — ONDANSETRON 8 MG PO TBDP
8.0000 mg | ORAL_TABLET | Freq: Once | ORAL | Status: AC
  Administered 2012-07-02: 8 mg via ORAL
  Filled 2012-07-02: qty 1
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
## 2012-09-29 ENCOUNTER — Emergency Department (HOSPITAL_COMMUNITY): Payer: Self-pay

## 2012-09-29 ENCOUNTER — Encounter (HOSPITAL_COMMUNITY): Payer: Self-pay | Admitting: *Deleted

## 2012-09-29 ENCOUNTER — Emergency Department (HOSPITAL_COMMUNITY)
Admission: EM | Admit: 2012-09-29 | Discharge: 2012-09-29 | Disposition: A | Discharge status: Home / Self Care | Payer: Self-pay | Attending: Emergency Medicine | Admitting: Emergency Medicine

## 2012-09-29 DIAGNOSIS — W108XXA Fall (on) (from) other stairs and steps, initial encounter: Secondary | ICD-10-CM | POA: Insufficient documentation

## 2012-09-29 DIAGNOSIS — F17200 Nicotine dependence, unspecified, uncomplicated: Secondary | ICD-10-CM | POA: Insufficient documentation

## 2012-09-29 DIAGNOSIS — Z79899 Other long term (current) drug therapy: Secondary | ICD-10-CM | POA: Insufficient documentation

## 2012-09-29 DIAGNOSIS — IMO0002 Reserved for concepts with insufficient information to code with codable children: Secondary | ICD-10-CM | POA: Insufficient documentation

## 2012-09-29 DIAGNOSIS — M069 Rheumatoid arthritis, unspecified: Secondary | ICD-10-CM | POA: Insufficient documentation

## 2012-09-29 DIAGNOSIS — D649 Anemia, unspecified: Secondary | ICD-10-CM | POA: Insufficient documentation

## 2012-09-29 DIAGNOSIS — S161XXA Strain of muscle, fascia and tendon at neck level, initial encounter: Secondary | ICD-10-CM

## 2012-09-29 DIAGNOSIS — G8929 Other chronic pain: Secondary | ICD-10-CM | POA: Insufficient documentation

## 2012-09-29 DIAGNOSIS — Y939 Activity, unspecified: Secondary | ICD-10-CM | POA: Insufficient documentation

## 2012-09-29 DIAGNOSIS — Z8659 Personal history of other mental and behavioral disorders: Secondary | ICD-10-CM | POA: Insufficient documentation

## 2012-09-29 DIAGNOSIS — Y929 Unspecified place or not applicable: Secondary | ICD-10-CM | POA: Insufficient documentation

## 2012-09-29 DIAGNOSIS — I10 Essential (primary) hypertension: Secondary | ICD-10-CM | POA: Insufficient documentation

## 2012-09-29 DIAGNOSIS — T07XXXA Unspecified multiple injuries, initial encounter: Secondary | ICD-10-CM | POA: Insufficient documentation

## 2012-09-29 DIAGNOSIS — S139XXA Sprain of joints and ligaments of unspecified parts of neck, initial encounter: Secondary | ICD-10-CM | POA: Insufficient documentation

## 2012-09-29 DIAGNOSIS — Z8709 Personal history of other diseases of the respiratory system: Secondary | ICD-10-CM | POA: Insufficient documentation

## 2012-09-29 MED ORDER — HYDROCODONE-ACETAMINOPHEN 5-325 MG PO TABS: 1.0000 | ORAL_TABLET | ORAL | Status: DC | PRN

## 2012-09-29 MED ORDER — BACLOFEN 10 MG PO TABS: 10.0000 mg | ORAL_TABLET | Freq: Three times a day (TID) | ORAL | Status: DC

## 2012-09-29 MED ORDER — METHOCARBAMOL 500 MG PO TABS
1000.0000 mg | ORAL_TABLET | Freq: Once | ORAL | Status: AC
  Administered 2012-09-29: 1000 mg via ORAL
  Filled 2012-09-29: qty 2

## 2012-09-29 MED ORDER — HYDROCODONE-ACETAMINOPHEN 5-325 MG PO TABS
2.0000 | ORAL_TABLET | Freq: Once | ORAL | Status: AC
  Administered 2012-09-29: 2 via ORAL
  Filled 2012-09-29: qty 2

## 2012-09-29 MED ORDER — DIPHENHYDRAMINE HCL 12.5 MG/5ML PO ELIX
12.5000 mg | ORAL_SOLUTION | Freq: Once | ORAL | Status: AC
  Administered 2012-09-29: 12.5 mg via ORAL
  Filled 2012-09-29: qty 5

## 2012-09-29 NOTE — ED Notes (Signed)
Pt reports that she fell down unknown # of stairs this am when her flip-flop broke, denies any loc.  C/o neck pain, c-collar placed in exam room.  Pt having left elbow pain. And generalized pain all over

## 2012-09-29 NOTE — ED Notes (Signed)
Pt states she fell down several steps an hour PTA. Pain to right elbow, bilateral knee pain, and neck pain.

## 2012-09-29 NOTE — ED Provider Notes (Signed)
CSN: [id number]     Arrival date & time 09/29/12  4098 History   First MD Initiated Contact with Patient 09/29/12 1006     Chief Complaint  Patient presents with  . Fall   (Consider location/radiation/quality/duration/timing/severity/associated sxs/prior Treatment) Patient is a 47 y.o. female presenting with fall. The history is provided by the patient.  Fall This is a new problem. The current episode started today. The problem occurs constantly. The problem has been gradually worsening. Associated symptoms include arthralgias. Pertinent negatives include no abdominal pain, chest pain, coughing or neck pain.    Past Medical History  Diagnosis Date  . Rheumatoid arthritis(714.0)   . Chronic anemia   . Hypertension   . Arthritis, rheumatoid   . Incidental lung nodule   . Depression   . Chronic pain   . Chronic back pain    Past Surgical History  Procedure Laterality Date  . Tubal ligation    . Endometrial ablation    . Breast cyst excision  12/31/2010    Procedure: CYST EXCISION BREAST;  Surgeon: Fabio Bering, MD;  Location: [facility];  Service: General;  Laterality: Right;  Excision Sebaceous Cyst Right Breast   Family History  Problem Relation Age of Onset  . Anesthesia problems Neg Hx   . Cancer Father    History  Substance Use Topics  . Smoking status: Current Every Day Smoker -- 0.03 packs/day for 30 years    Types: Cigarettes  . Smokeless tobacco: Never Used  . Alcohol Use: No     Comment: occassional   OB History   Grav Para Term Preterm Abortions TAB SAB Ect Mult Living   6 5 5  1  1   5      Review of Systems  Constitutional: Negative for activity change.       All ROS Neg except as noted in HPI  HENT: Negative for nosebleeds and neck pain.   Eyes: Negative for photophobia and discharge.  Respiratory: Negative for cough, shortness of breath and wheezing.   Cardiovascular: Negative for chest pain and palpitations.  Gastrointestinal: Negative for abdominal  pain and blood in stool.  Genitourinary: Negative for dysuria, frequency and hematuria.  Musculoskeletal: Positive for back pain and arthralgias.  Skin: Negative.   Neurological: Negative for dizziness, seizures and speech difficulty.  Psychiatric/Behavioral: Negative for hallucinations and confusion.       Depression    Allergies  Codeine  Home Medications   Current Outpatient Rx  Name  Route  Sig  Dispense  Refill  . Aspirin-Caffeine (BC FAST PAIN RELIEF ARTHRITIS PO)   Oral   Take 1 Package by mouth every 6 (six) hours as needed (Pain).         . Calcium Carbonate-Vitamin D (CALCIUM 600 + D PO)   Oral   Take 1 tablet by mouth every evening.          . ferrous sulfate 325 (65 FE) MG tablet   Oral   Take 325 mg by mouth 2 (two) times daily. For anemia          . folic acid (FOLVITE) 1 MG tablet   Oral   Take 1 mg by mouth daily.           . hydroxychloroquine (PLAQUENIL) 200 MG tablet   Oral   Take 200 mg by mouth daily.         . methotrexate (RHEUMATREX) 2.5 MG tablet   Oral   Take 12.5 mg  by mouth once a week. Take 5 tablets every Monday and Tuesday :Caution:Chemotherapy. Protect from light.         . Multiple Vitamin (MULTIVITAMIN WITH MINERALS) TABS   Oral   Take 1 tablet by mouth daily.         . baclofen (LIORESAL) 10 MG tablet   Oral   Take 1 tablet (10 mg total) by mouth 3 (three) times daily.   21 each   0   . HYDROcodone-acetaminophen (NORCO/VICODIN) 5-325 MG per tablet   Oral   Take 1 tablet by mouth every 4 (four) hours as needed for pain.   15 tablet   0    BP 117/77  Pulse 102  Temp(Src) 98.3 F (36.8 C) (Oral)  SpO2 99% Physical Exam  Nursing note and vitals reviewed. Constitutional: She is oriented to person, place, and time. She appears well-developed and well-nourished.  Non-toxic appearance.  HENT:  Head: Normocephalic.  Right Ear: Tympanic membrane and external ear normal.  Left Ear: Tympanic membrane and external  ear normal.  Eyes: EOM and lids are normal. Pupils are equal, round, and reactive to light.  Neck: Trachea normal and normal range of motion. Neck supple. Normal carotid pulses present. Spinous process tenderness present. Carotid bruit is not present.  Cardiovascular: Normal rate, regular rhythm, normal heart sounds, intact distal pulses and normal pulses.   Pulmonary/Chest: Breath sounds normal. No respiratory distress.  Abdominal: Soft. Bowel sounds are normal. There is no tenderness. There is no guarding.  Musculoskeletal: Normal range of motion.       Right elbow: She exhibits swelling. She exhibits no deformity. Tenderness found. Olecranon process tenderness noted. No radial head tenderness noted.  Few minor abrasions of the knees. No effusions. No deformity. Achilles intact bilat. DP 2+.   Lymphadenopathy:       Head (right side): No submandibular adenopathy present.       Head (left side): No submandibular adenopathy present.    She has no cervical adenopathy.  Neurological: She is alert and oriented to person, place, and time. She has normal strength. No cranial nerve deficit or sensory deficit.  Skin: Skin is warm and dry.  Psychiatric: She has a normal mood and affect. Her speech is normal.    ED Course  Procedures (including critical care time) Labs Review Labs Reviewed - No data to display Imaging Review Dg Cervical Spine Complete  09/29/2012   CLINICAL DATA:  Status post fall. Neck pain.  EXAM: CERVICAL SPINE  4+ VIEWS  COMPARISON:  Plain film cervical spine 07/02/2012 and CT cervical spine 01/24/2012.  FINDINGS: There is no evidence of cervical spine fracture or prevertebral soft tissue swelling. Alignment is normal. No other significant bone abnormalities are identified.  IMPRESSION: Negative cervical spine radiographs.   Electronically Signed   By: Drusilla Kanner M.D.   On: 09/29/2012 11:21   Dg Elbow Complete Right  09/29/2012   CLINICAL DATA:  Status post fall. Right  elbow pain.  EXAM: RIGHT ELBOW - COMPLETE 3+ VIEW  COMPARISON:  Plain films right elbow 01/24/2012.  FINDINGS: No fracture, dislocation or joint effusion is identified. There is some soft tissue swelling posterior to the elbow.  IMPRESSION: Posterior soft tissue swelling without underlying acute bony or joint abnormality.   Electronically Signed   By: Drusilla Kanner M.D.   On: 09/29/2012 11:22    MDM   1. Cervical strain, initial encounter   2. Contusion, multiple sites    *I have reviewed  nursing notes, vital signs, and all appropriate lab and imaging results for this patient.**  Pt sustained a fall down steps. C spine is neg for fracture. Right elbow reveals soft tissue swelling, but no bone abnormality. Pt fitted with sling, and rx given for baclofen and norco. Pt to follow up with orthopedics.  Kathie Dike, PA-C 10/01/12 [phone]

## 2012-10-01 IMAGING — CT CT ABD-PELV W/O CM
2 of 3 series · 9 of 46 positions shown, 11 images · non-contrast
Comparison: CT abdomen pelvis of 04/06/2003

CLINICAL DATA: Left sided abdomen and flank pain for 1 day

CT ABDOMEN AND PELVIS WITHOUT CONTRAST
TECHNIQUE: Multidetector CT imaging of the abdomen and pelvis was
performed following the standard protocol without intravenous
contrast.

[Series 4: mpr coronal (id) · coronal · 0.70mm/px · 8 of 77 slices shown, 9 images]
[im 9/77  soft-tissue]
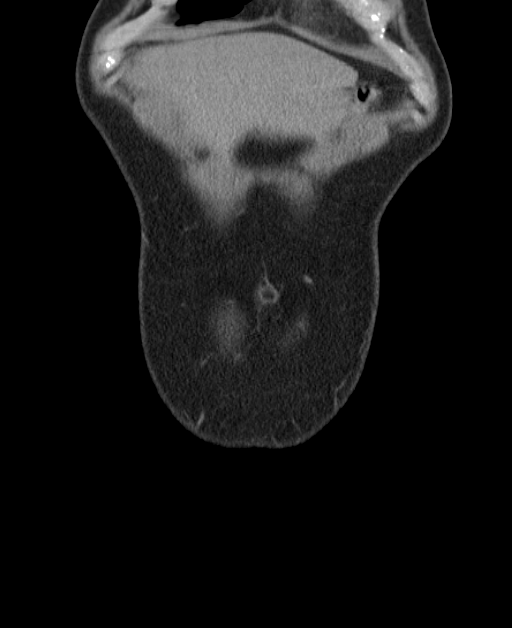
[im 9/77  bone]
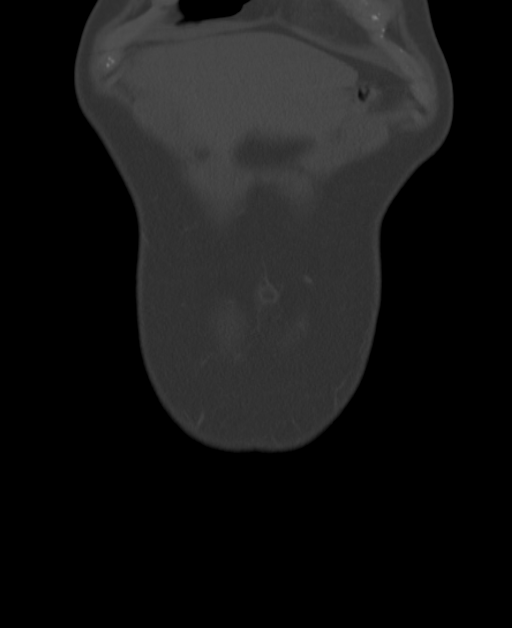
[im 17/77  soft-tissue]
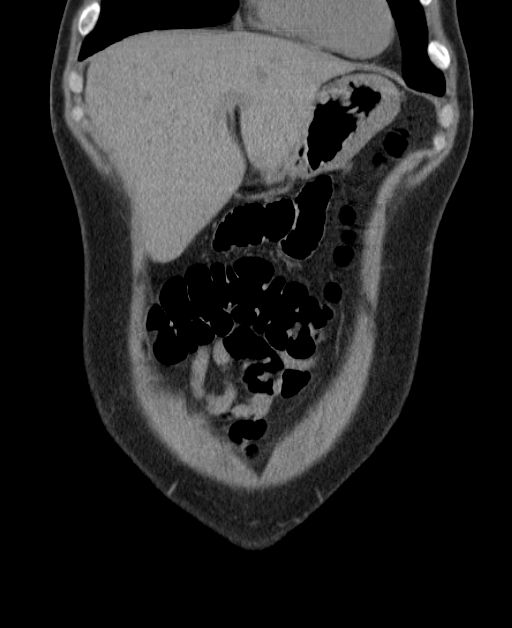
[im 26/77  soft-tissue]
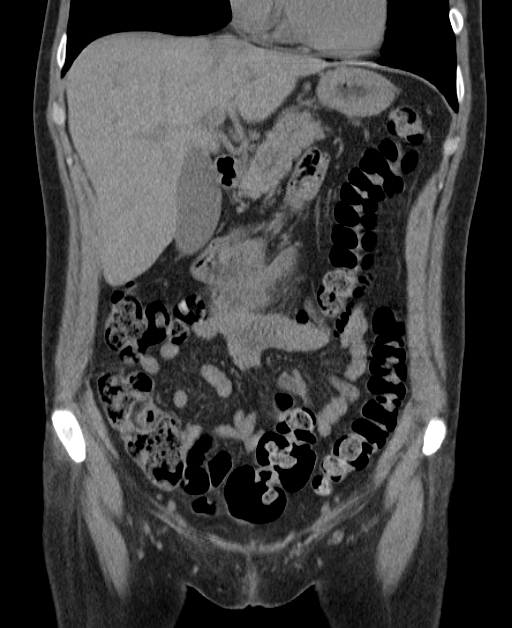
[im 34/77  soft-tissue]
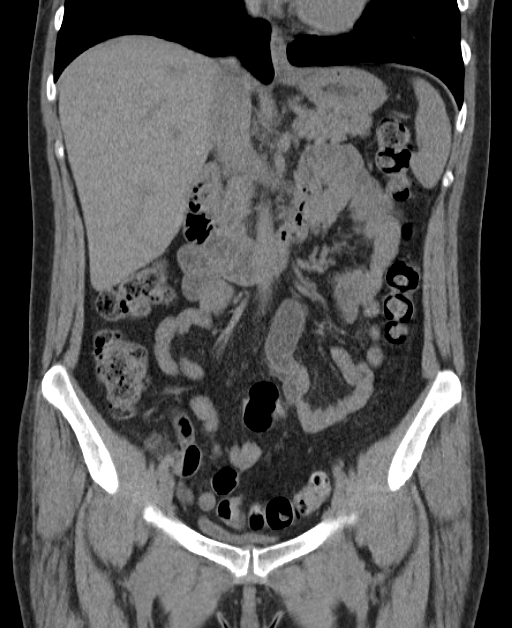
[im 43/77  soft-tissue]
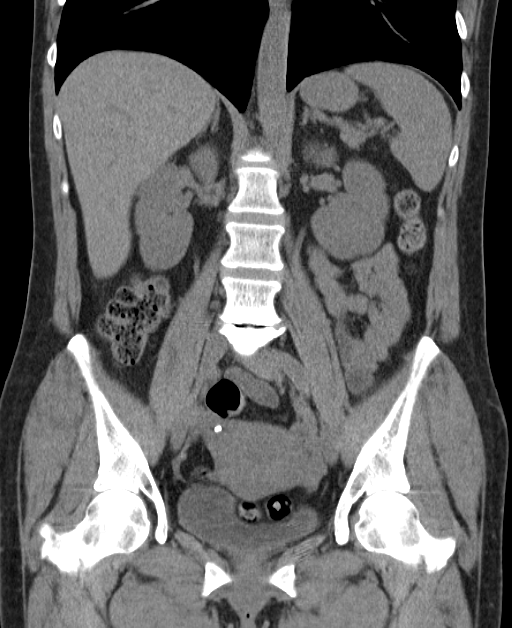
[im 51/77  soft-tissue]
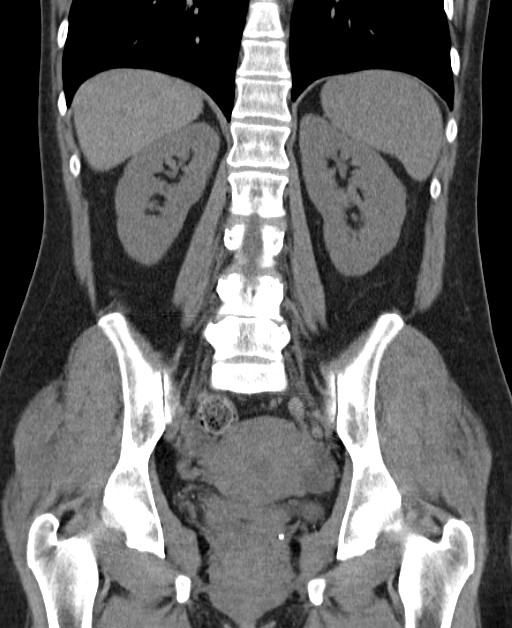
[im 60/77  soft-tissue]
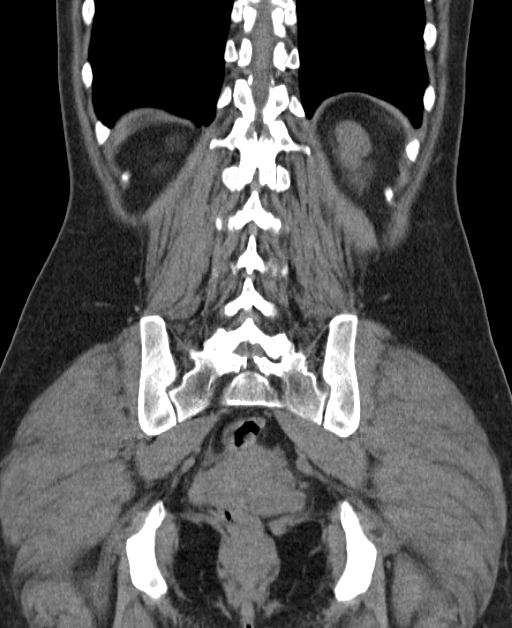
[im 68/77  soft-tissue]
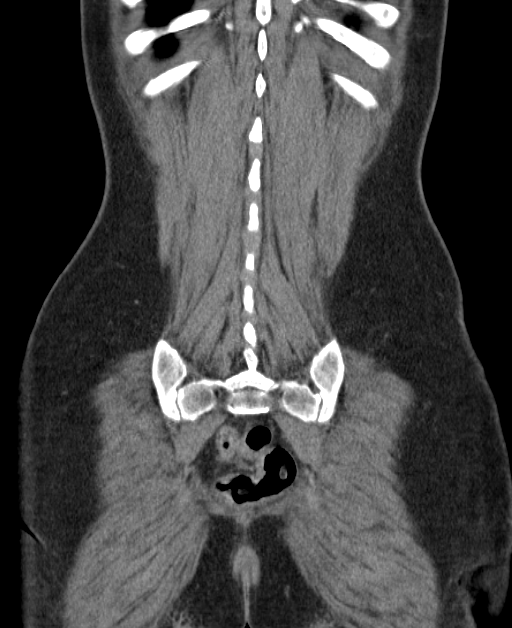

[Series 5: mpr sagittal (id) · sagittal · 0.50mm/px · 1 of 110 slices shown, 2 images]
[im 37/110  soft-tissue]
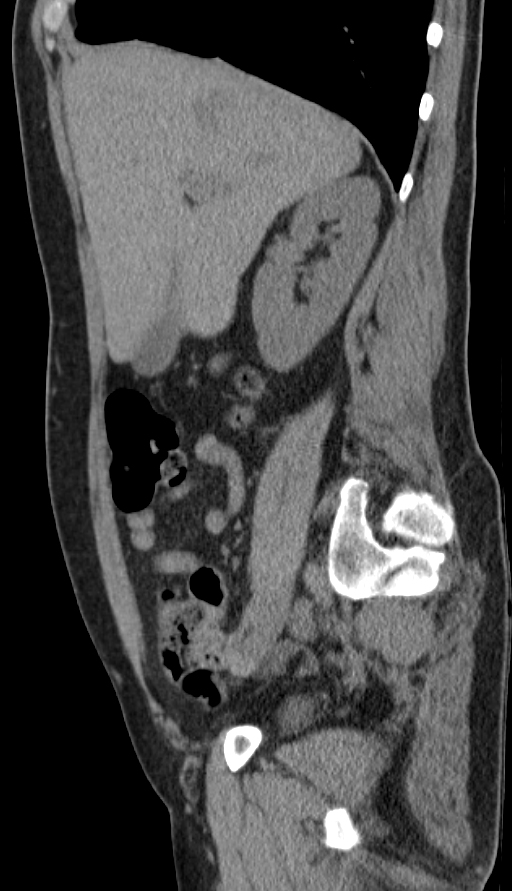
[im 37/110  bone]
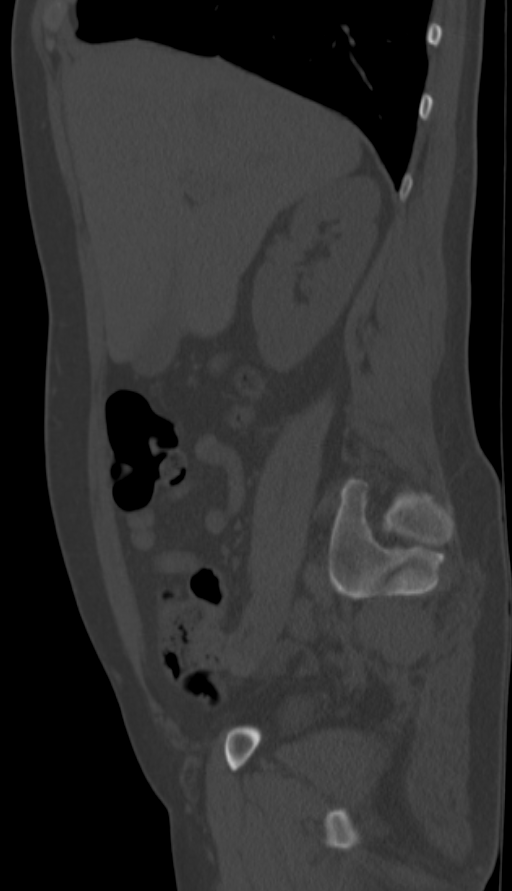

[9 of 46 positions shown; findings below may reference images not displayed]

FINDINGS: The lung bases are clear.  The liver is unremarkable in
the unenhanced state.  No calcified gallstones are seen.  The
pancreas is normal in size and the pancreatic duct is not dilated.
The adrenal glands and spleen are unremarkable.  The stomach is not
well distended.  No renal calculi are seen and there is no evidence
of hydronephrosis.  The abdominal aorta is normal in caliber.

The urinary bladder is not well distended and cannot be evaluated.
The uterus is normal in size.  Small ovarian follicles are present
and only a tiny amount of free fluid is noted in the pelvis.  The
appendix is visualized in the right lower quadrant and appears to
have some retained contrast but no appendicitis is noted.  The
terminal ileum is unremarkable.  There is degenerative disc disease
at the L4-5 level.
IMPRESSION: 1.  No explanation for the patient's pain is seen.  No renal
calculi or hydronephrosis is noted.
2.  Small ovarian follicles.  Small amount of free fluid in the
pelvis.
3.  The appendix and terminal ileum appear normal.

## 2012-10-01 NOTE — ED Notes (Signed)
[facility] pharmacist called and stated the pt was attempting to fill a hydrocodone prescription that looked like it had been altered.  Advised not to fill the script due to being altered.

## 2012-10-02 ENCOUNTER — Encounter (HOSPITAL_COMMUNITY): Payer: Self-pay

## 2012-10-02 ENCOUNTER — Emergency Department (HOSPITAL_COMMUNITY)
Admission: EM | Admit: 2012-10-02 | Discharge: 2012-10-02 | Disposition: A | Discharge status: Home / Self Care | Payer: Self-pay | Attending: Emergency Medicine | Admitting: Emergency Medicine

## 2012-10-02 DIAGNOSIS — M549 Dorsalgia, unspecified: Secondary | ICD-10-CM | POA: Insufficient documentation

## 2012-10-02 DIAGNOSIS — M069 Rheumatoid arthritis, unspecified: Secondary | ICD-10-CM | POA: Insufficient documentation

## 2012-10-02 DIAGNOSIS — F17200 Nicotine dependence, unspecified, uncomplicated: Secondary | ICD-10-CM | POA: Insufficient documentation

## 2012-10-02 DIAGNOSIS — Z760 Encounter for issue of repeat prescription: Secondary | ICD-10-CM | POA: Insufficient documentation

## 2012-10-02 DIAGNOSIS — G8929 Other chronic pain: Secondary | ICD-10-CM | POA: Insufficient documentation

## 2012-10-02 DIAGNOSIS — D649 Anemia, unspecified: Secondary | ICD-10-CM | POA: Insufficient documentation

## 2012-10-02 DIAGNOSIS — Z8659 Personal history of other mental and behavioral disorders: Secondary | ICD-10-CM | POA: Insufficient documentation

## 2012-10-02 DIAGNOSIS — Z79899 Other long term (current) drug therapy: Secondary | ICD-10-CM | POA: Insufficient documentation

## 2012-10-02 DIAGNOSIS — I10 Essential (primary) hypertension: Secondary | ICD-10-CM | POA: Insufficient documentation

## 2012-10-02 MED ORDER — HYDROCODONE-ACETAMINOPHEN 5-325 MG PO TABS: 1.0000 | ORAL_TABLET | ORAL | Status: DC | PRN

## 2012-10-02 MED ORDER — BACLOFEN 10 MG PO TABS: 10.0000 mg | ORAL_TABLET | Freq: Three times a day (TID) | ORAL | Status: AC

## 2012-10-02 NOTE — ED Provider Notes (Signed)
CSN: [id number]     Arrival date & time 10/02/12  1252 History   First MD Initiated Contact with Patient 10/02/12 1444     Chief Complaint  Patient presents with  . Medication Refill   (Consider location/radiation/quality/duration/timing/severity/associated sxs/prior Treatment) HPI Comments: Patient was seen in the emergency department recently after having sustained a fall down steps. She had x-rays done which were negative. She had some abrasions from the fall. The patient was prescribed Norco and baclofen. The patient cut the prescription and presented only one half of it to the pharmacist. The prescription was rejected, the patient presents now to the emergency department for assistance with a new prescription for her pain.  The history is provided by the patient.    Past Medical History  Diagnosis Date  . Rheumatoid arthritis(714.0)   . Chronic anemia   . Hypertension   . Arthritis, rheumatoid   . Incidental lung nodule   . Depression   . Chronic pain   . Chronic back pain    Past Surgical History  Procedure Laterality Date  . Tubal ligation    . Endometrial ablation    . Breast cyst excision  12/31/2010    Procedure: CYST EXCISION BREAST;  Surgeon: Fabio Bering, MD;  Location: [facility];  Service: General;  Laterality: Right;  Excision Sebaceous Cyst Right Breast   Family History  Problem Relation Age of Onset  . Anesthesia problems Neg Hx   . Cancer Father    History  Substance Use Topics  . Smoking status: Current Every Day Smoker -- 0.03 packs/day for 30 years    Types: Cigarettes  . Smokeless tobacco: Never Used  . Alcohol Use: No     Comment: occassional   OB History   Grav Para Term Preterm Abortions TAB SAB Ect Mult Living   6 5 5  1  1   5      Review of Systems  Constitutional: Negative for activity change.       All ROS Neg except as noted in HPI  HENT: Negative for nosebleeds and neck pain.   Eyes: Negative for photophobia and discharge.   Respiratory: Negative for cough, shortness of breath and wheezing.   Cardiovascular: Negative for chest pain and palpitations.  Gastrointestinal: Negative for abdominal pain and blood in stool.  Genitourinary: Negative for dysuria, frequency and hematuria.  Musculoskeletal: Positive for back pain and arthralgias.  Skin: Negative.   Neurological: Negative for dizziness, seizures and speech difficulty.  Psychiatric/Behavioral: Negative for hallucinations and confusion.    Allergies  Codeine  Home Medications   Current Outpatient Rx  Name  Route  Sig  Dispense  Refill  . Aspirin-Caffeine (BC FAST PAIN RELIEF ARTHRITIS PO)   Oral   Take 1 Package by mouth every 6 (six) hours as needed (Pain).         . baclofen (LIORESAL) 10 MG tablet   Oral   Take 1 tablet (10 mg total) by mouth 3 (three) times daily.   21 each   0   . Calcium Carbonate-Vitamin D (CALCIUM 600 + D PO)   Oral   Take 1 tablet by mouth every evening.          . ferrous sulfate 325 (65 FE) MG tablet   Oral   Take 325 mg by mouth 2 (two) times daily. For anemia          . folic acid (FOLVITE) 1 MG tablet   Oral   Take  1 mg by mouth daily.           Marland Kitchen HYDROcodone-acetaminophen (NORCO/VICODIN) 5-325 MG per tablet   Oral   Take 1 tablet by mouth every 4 (four) hours as needed for pain.   15 tablet   0   . hydroxychloroquine (PLAQUENIL) 200 MG tablet   Oral   Take 200 mg by mouth daily.         . methotrexate (RHEUMATREX) 2.5 MG tablet   Oral   Take 12.5 mg by mouth once a week. Take 5 tablets every Monday and Tuesday :Caution:Chemotherapy. Protect from light.         . Multiple Vitamin (MULTIVITAMIN WITH MINERALS) TABS   Oral   Take 1 tablet by mouth daily.          BP 134/89  Pulse 82  Temp(Src) 98.3 F (36.8 C) (Oral)  Resp 16  SpO2 99% Physical Exam  Nursing note and vitals reviewed. Constitutional: She is oriented to person, place, and time. She appears well-developed and  well-nourished.  Non-toxic appearance.  HENT:  Head: Normocephalic.  Right Ear: Tympanic membrane and external ear normal.  Left Ear: Tympanic membrane and external ear normal.  Eyes: EOM and lids are normal. Pupils are equal, round, and reactive to light.  Neck: Normal range of motion. Neck supple. Carotid bruit is not present.  There is some stiffness of the neck, but improved from previous examination by me.  Cardiovascular: Normal rate, regular rhythm, normal heart sounds, intact distal pulses and normal pulses.   Pulmonary/Chest: Breath sounds normal. No respiratory distress.  Abdominal: Soft. Bowel sounds are normal. There is no tenderness. There is no guarding.  Musculoskeletal: Normal range of motion.  There is stiffness and soreness of the right elbow, patient is no longer wearing the sling. The swelling of the right elbow that was previously identified has begun to improve, but has not completely resolved.  Lymphadenopathy:       Head (right side): No submandibular adenopathy present.       Head (left side): No submandibular adenopathy present.    She has no cervical adenopathy.  Neurological: She is alert and oriented to person, place, and time. She has normal strength. No cranial nerve deficit or sensory deficit.  Skin: Skin is warm and dry.  Psychiatric: She has a normal mood and affect. Her speech is normal.    ED Course  Procedures (including critical care time) Labs Review Labs Reviewed - No data to display Imaging Review No results found.  MDM   1. Medication refill    **I have reviewed nursing notes, vital signs, and all appropriate lab and imaging results for this patient.*   Patient altered a prescription that was written a few days ago. She presents now to see she can get a replacement prescription as the pharmacist would not on her the altered prescription.  I have explained to the patient in detail the danger and importance of not tapering with  prescriptions, particularly narcotic prescriptions. As a courtesy I am going to refill this on prescription, but I have explained to the patient that this is should never happen again. Patient states that she is scheduled to see the orthopedic specialist in 2 days.   Kathie Dike, PA-C 10/02/12 1459

## 2012-10-02 NOTE — ED Notes (Signed)
Pt reports was seen here recently and was given a prescription for baclofen and hydrocodone.  Pt says she was initially only going to have the baclofen filled because she thought it was an antibiotic.  [facility] pharmacy staff said pt had cut the prescription in half and they would not fill either one because they had been altered.  Pt says was told by pharmacy if she needed another prescription for pain she had to be reevaluated.

## 2012-10-02 NOTE — ED Provider Notes (Signed)
Medical screening examination/treatment/procedure(s) were performed by non-physician practitioner and as supervising physician I was immediately available for consultation/collaboration.    Candyce Churn, MD 10/02/12 [phone]

## 2012-10-05 NOTE — ED Provider Notes (Signed)
Medical screening examination/treatment/procedure(s) were performed by non-physician practitioner and as supervising physician I was immediately available for consultation/collaboration.   Anushri Casalino J Bronwen Pendergraft, MD 10/05/12 1439 

## 2012-11-01 IMAGING — CR DG WRIST COMPLETE 3+V*L*
2 series · 2 of 2 positions shown · non-contrast
Comparison: None.

CLINICAL DATA: Fell recently with pain

LEFT WRIST - COMPLETE 3+ VIEW

[view not recorded (1 of 2)]
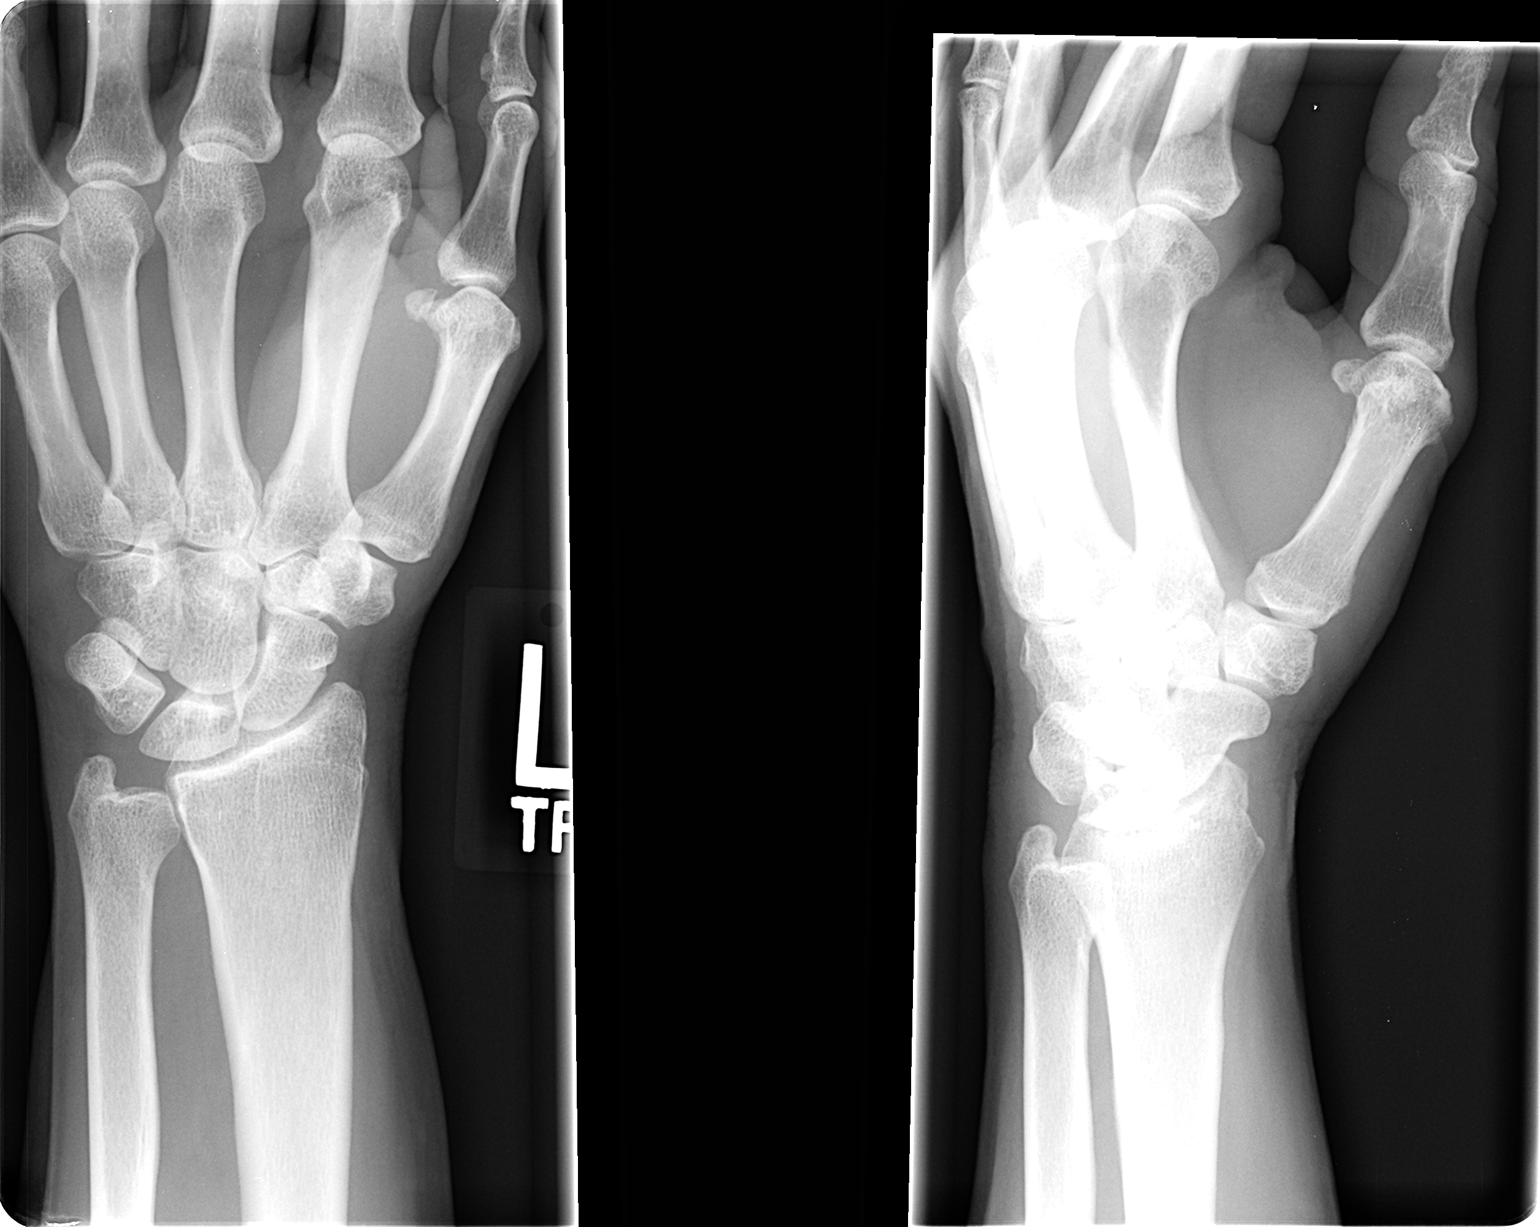

[view not recorded (2 of 2)]
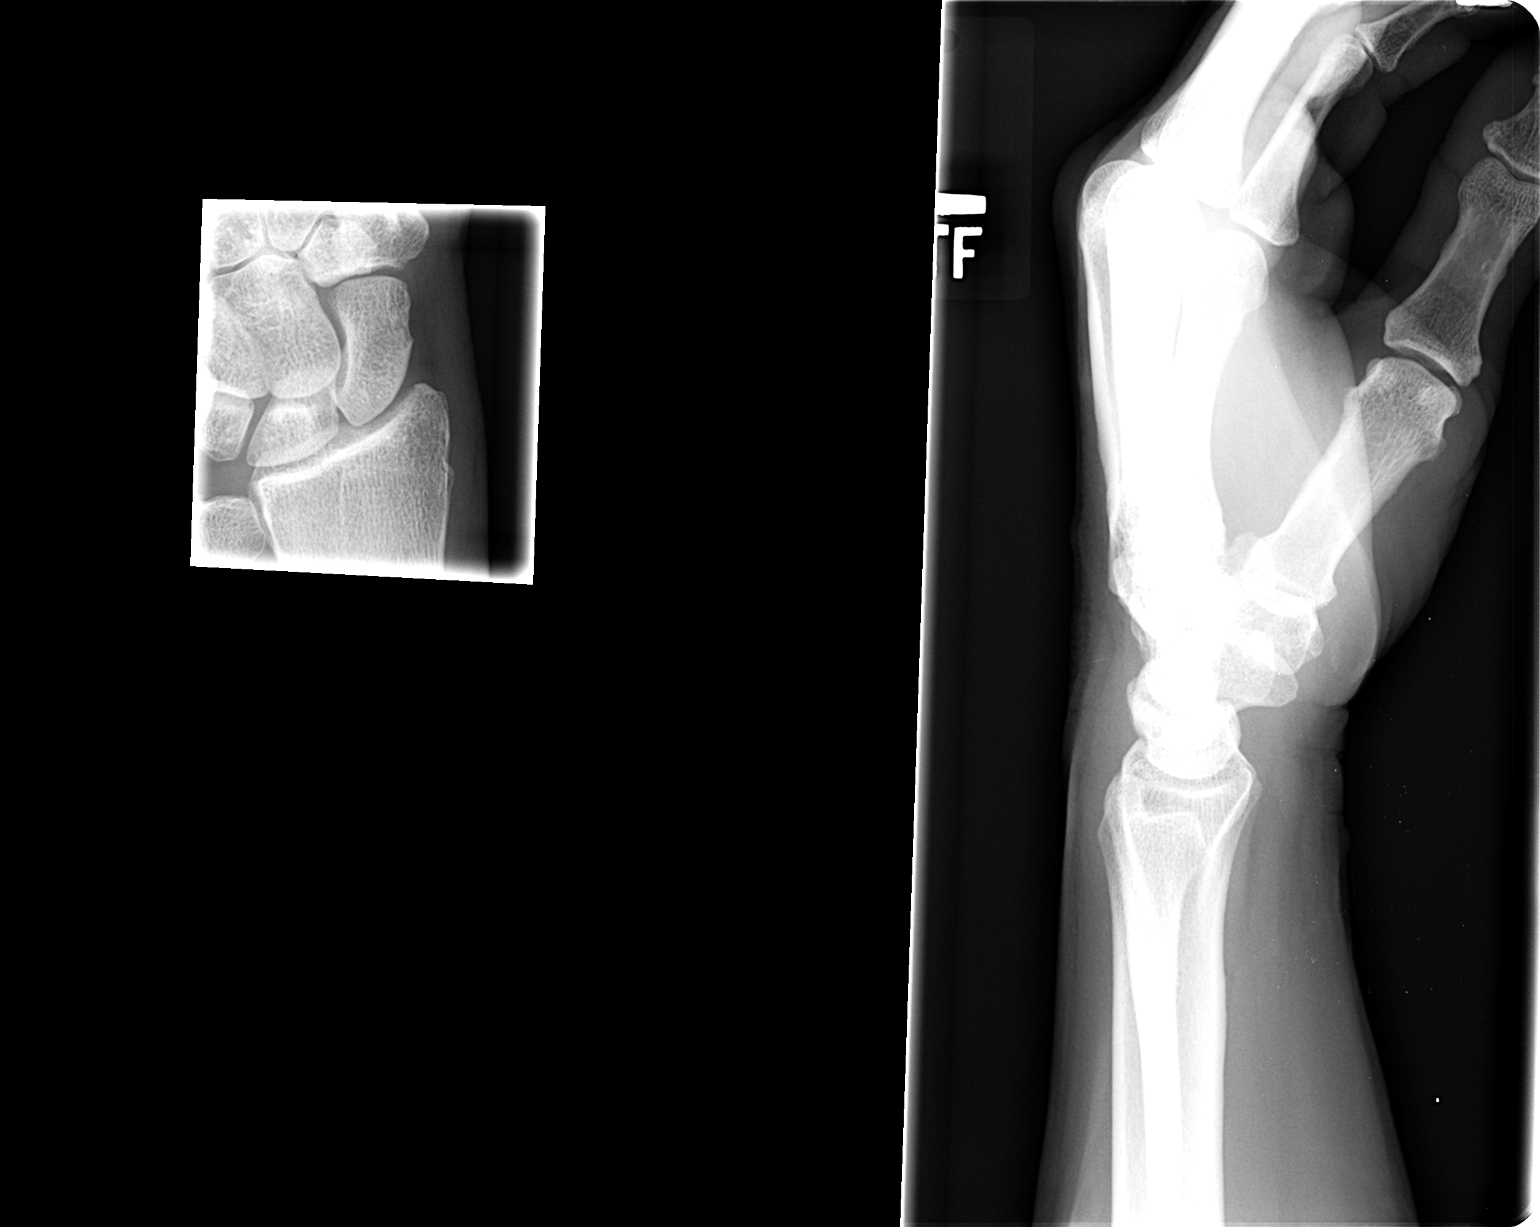

[2 of 2 positions shown; findings below may reference images not displayed]

FINDINGS: The radiocarpal joint space appears normal.  There is
only a slight ulnar negative variance.  The carpal bones are in
normal position.  No acute fracture is seen.
IMPRESSION: No acute abnormality.

## 2012-11-01 IMAGING — CR DG ELBOW COMPLETE 3+V*L*
2 series · 2 of 2 positions shown · non-contrast
Comparison: None.

CLINICAL DATA: Fell an hour ago with pain

LEFT ELBOW - COMPLETE 3+ VIEW

[view not recorded (1 of 2)]
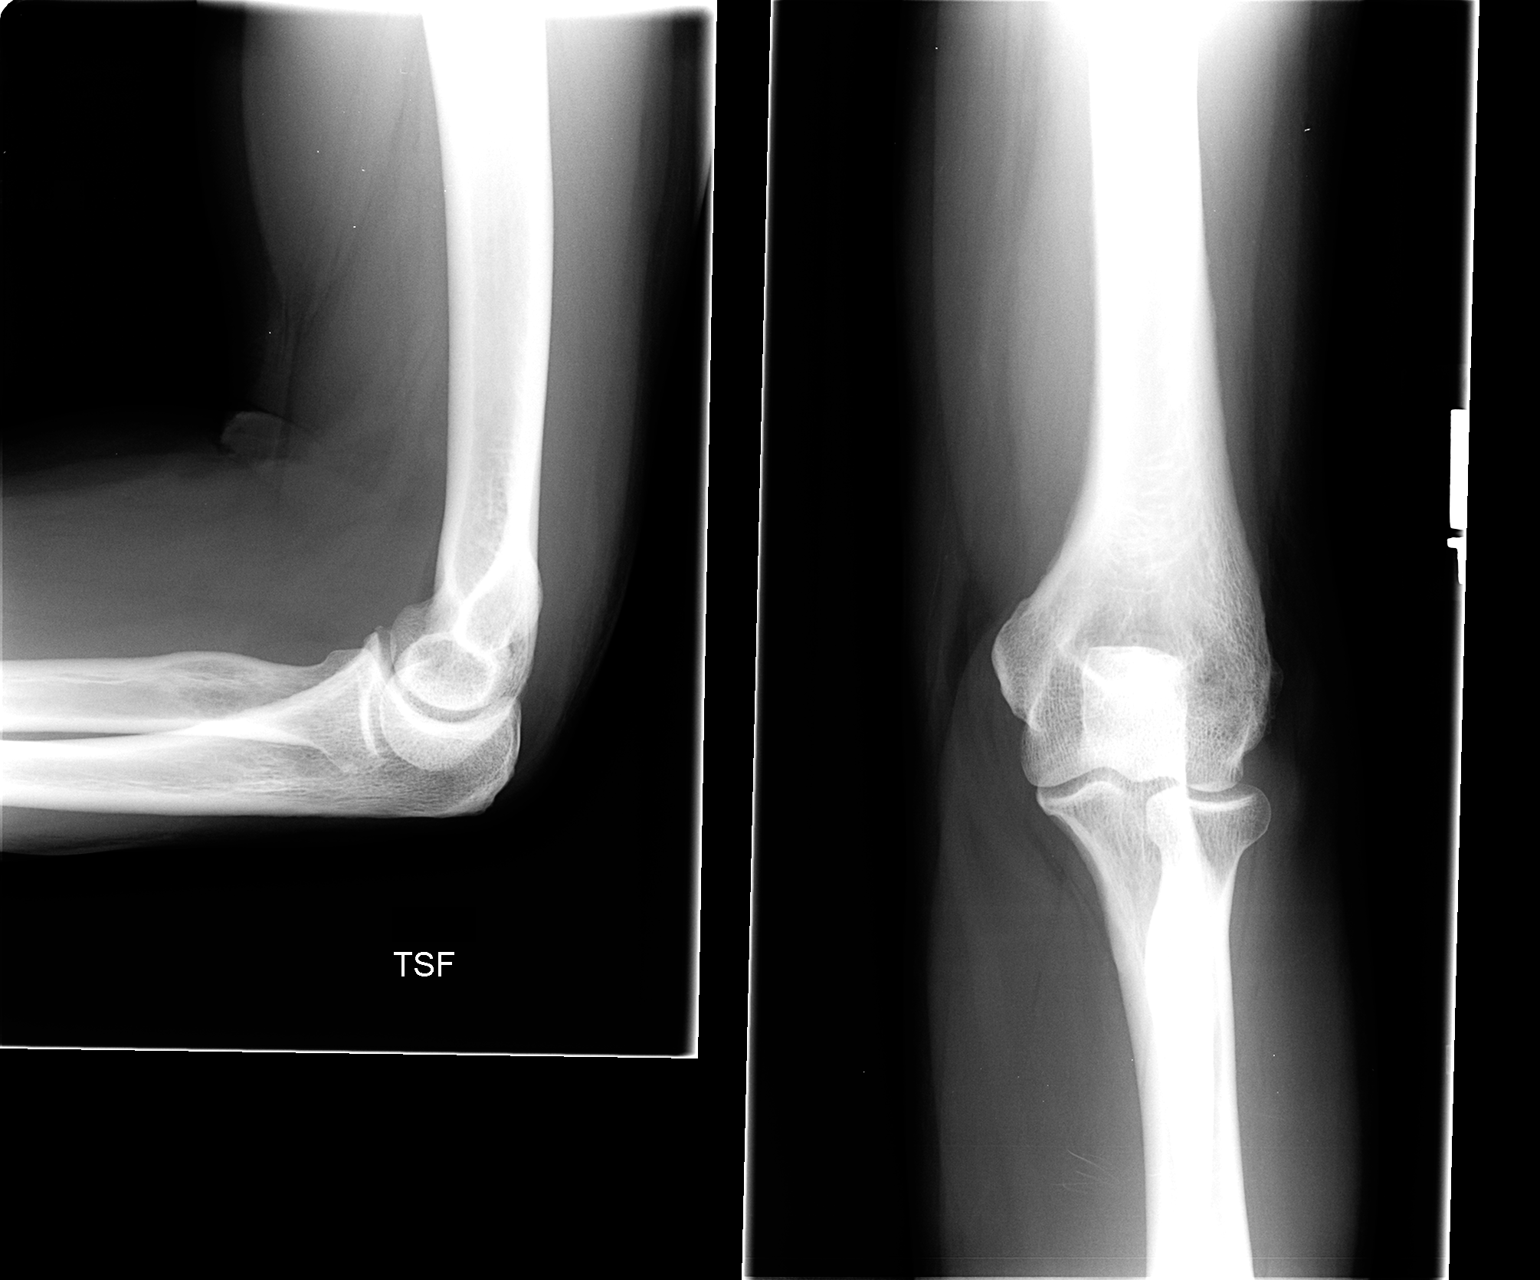

[view not recorded (2 of 2)]
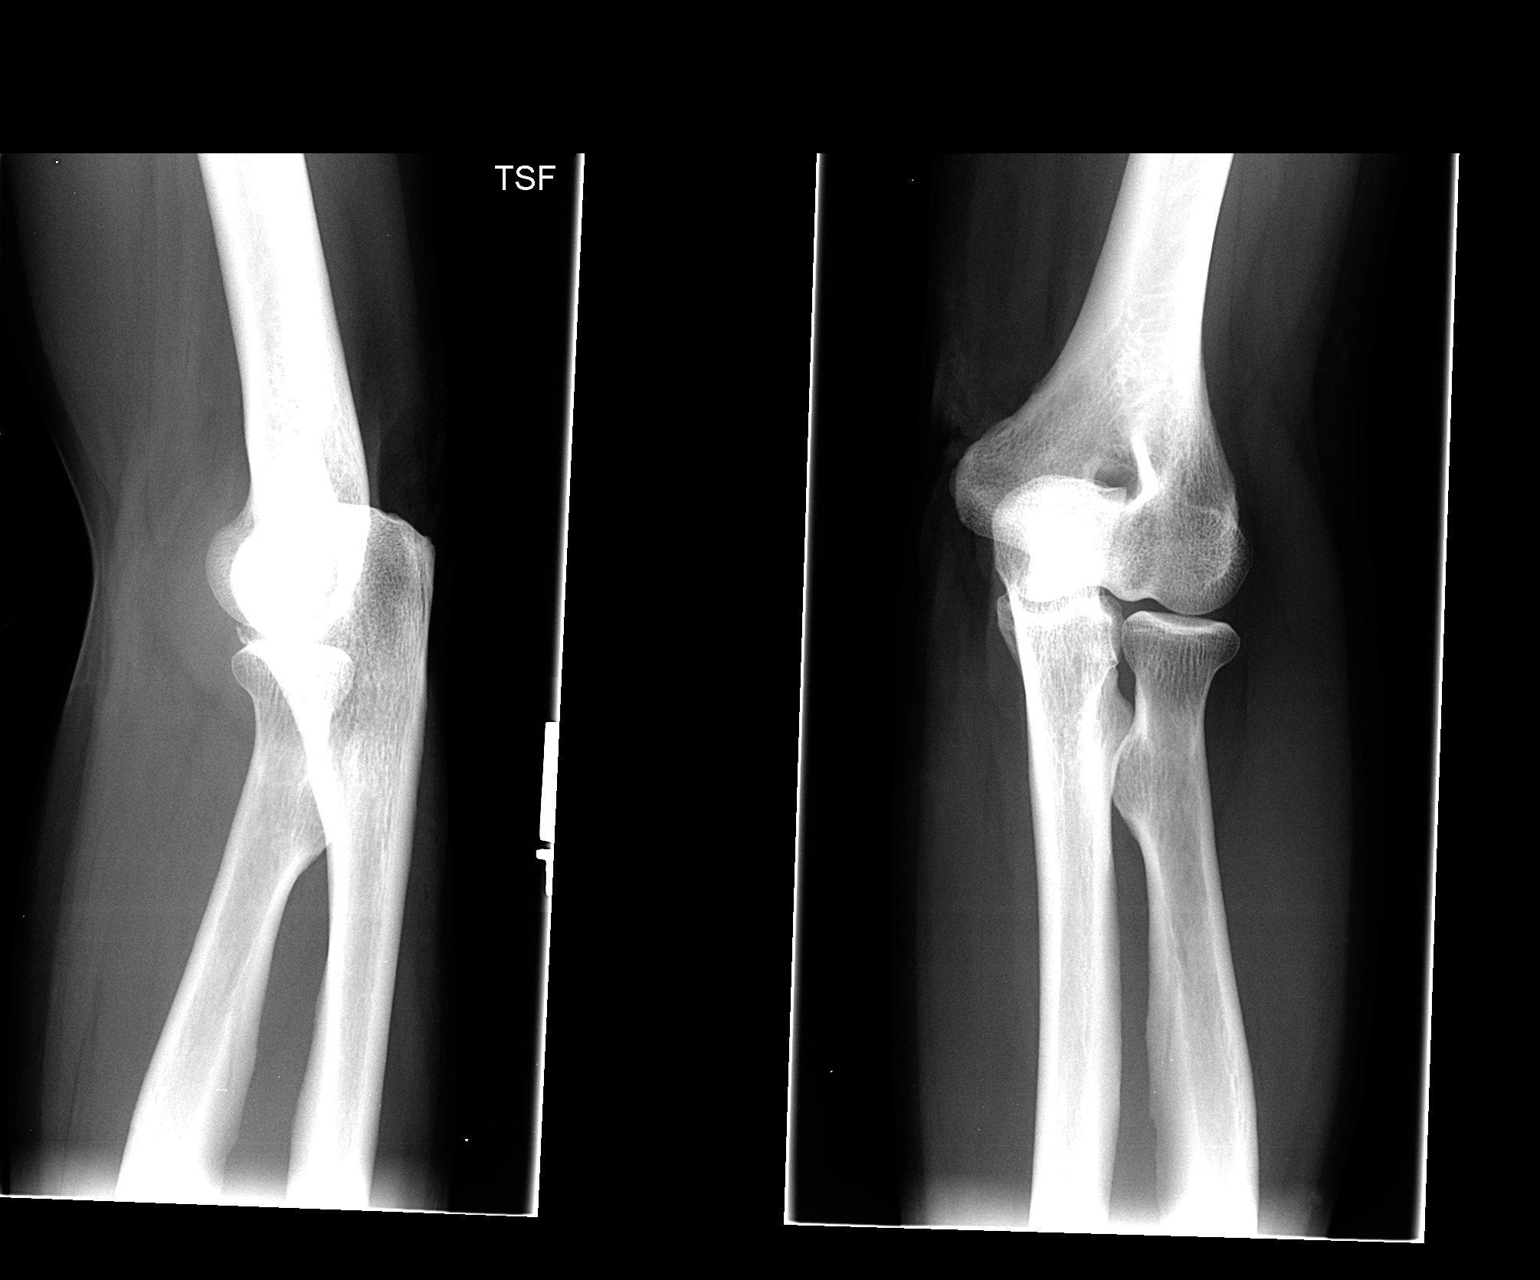

[2 of 2 positions shown; findings below may reference images not displayed]

FINDINGS: No acute fracture is seen.  Alignment is normal.  No
elbow joint effusion is noted.
IMPRESSION: Negative left elbow.

## 2012-11-01 IMAGING — CR DG SACRUM/COCCYX 2+V
3 series · 3 of 3 positions shown · non-contrast
Comparison: CT abdomen pelvis of 09/19/2010

CLINICAL DATA: Fell 1 hour ago with pain

SACRUM AND COCCYX - 2+ VIEW

[view not recorded (1 of 3)]
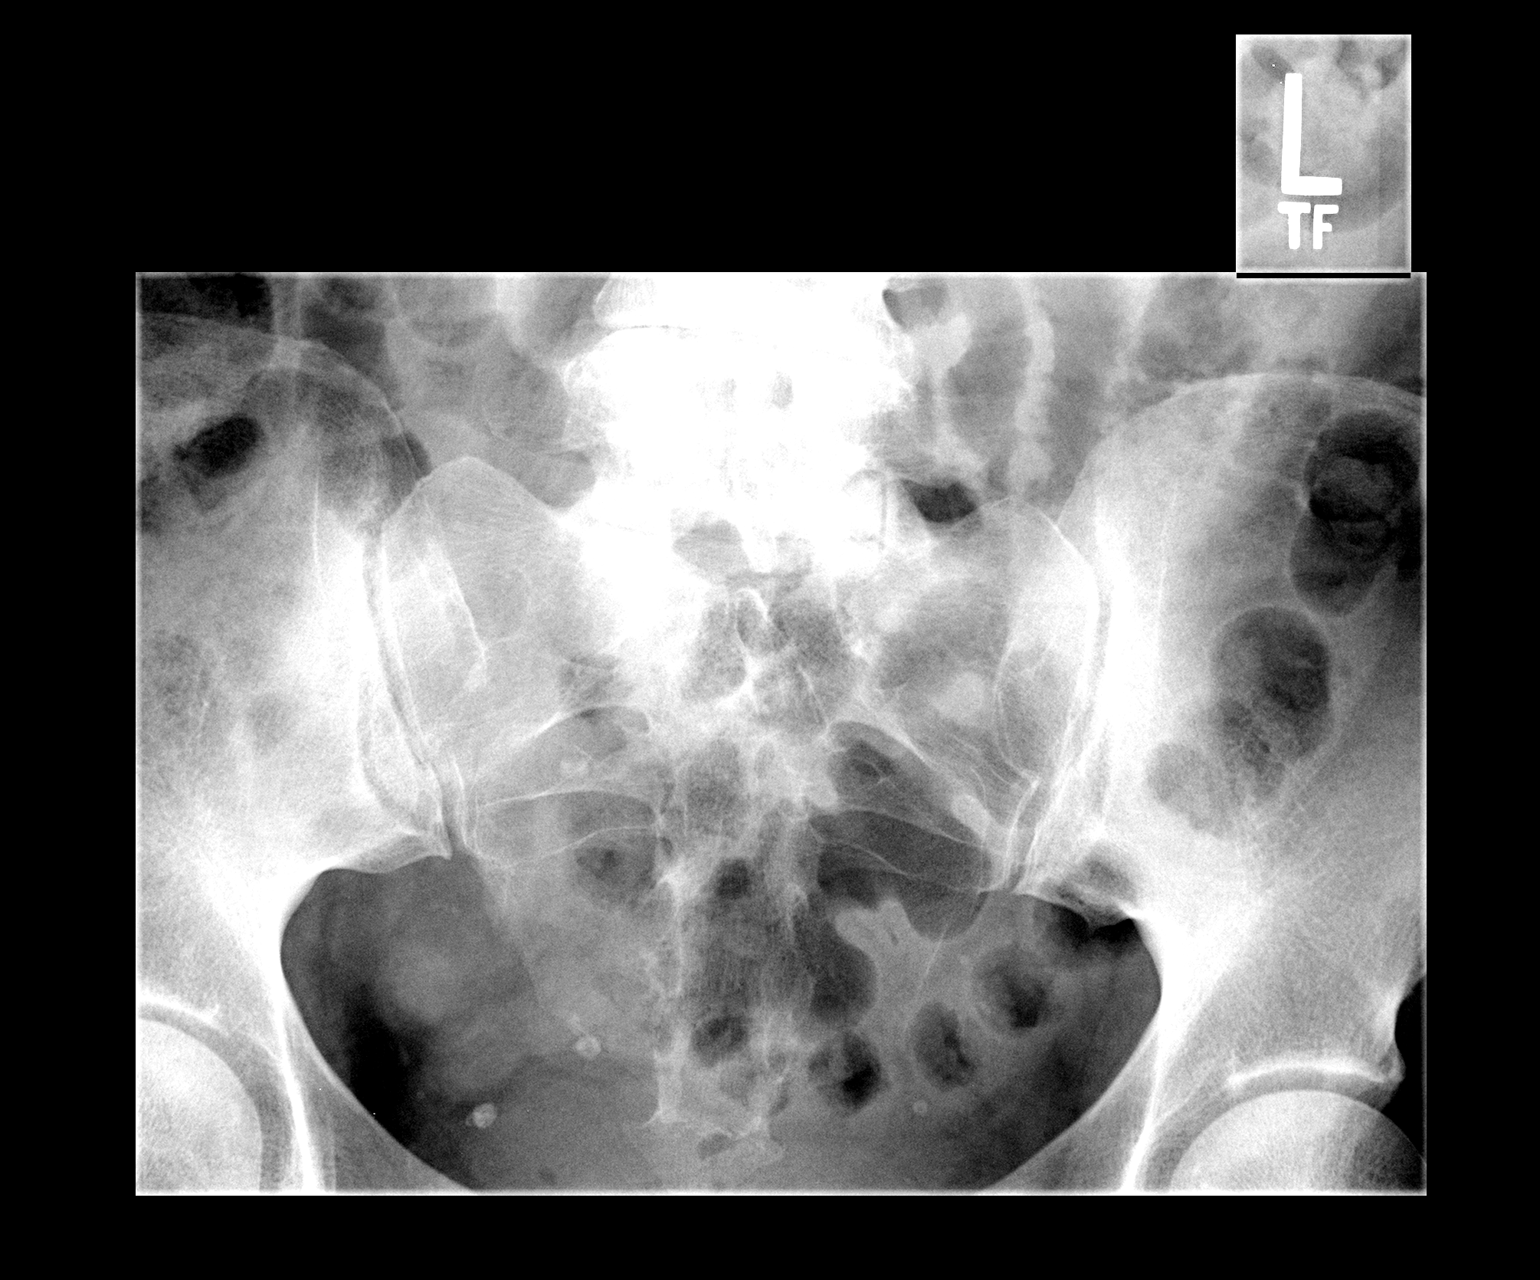

[view not recorded (2 of 3)]
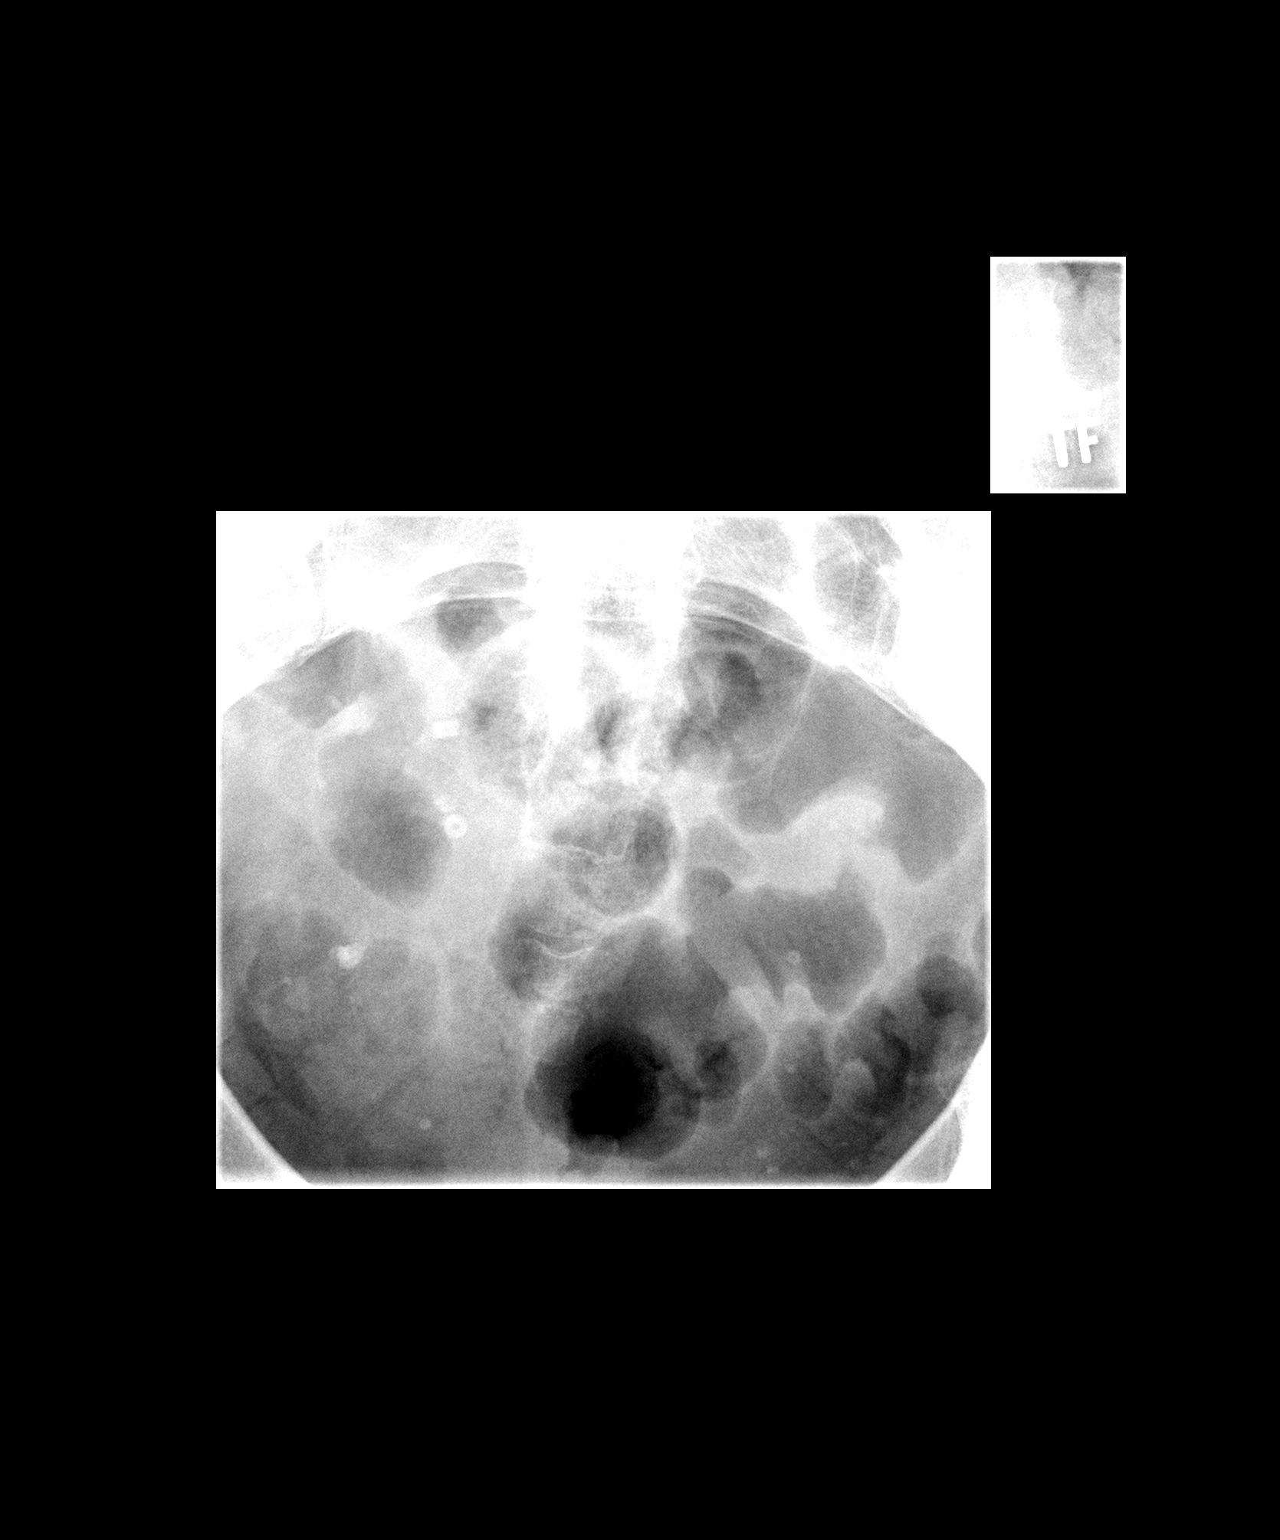

[view not recorded (3 of 3)]
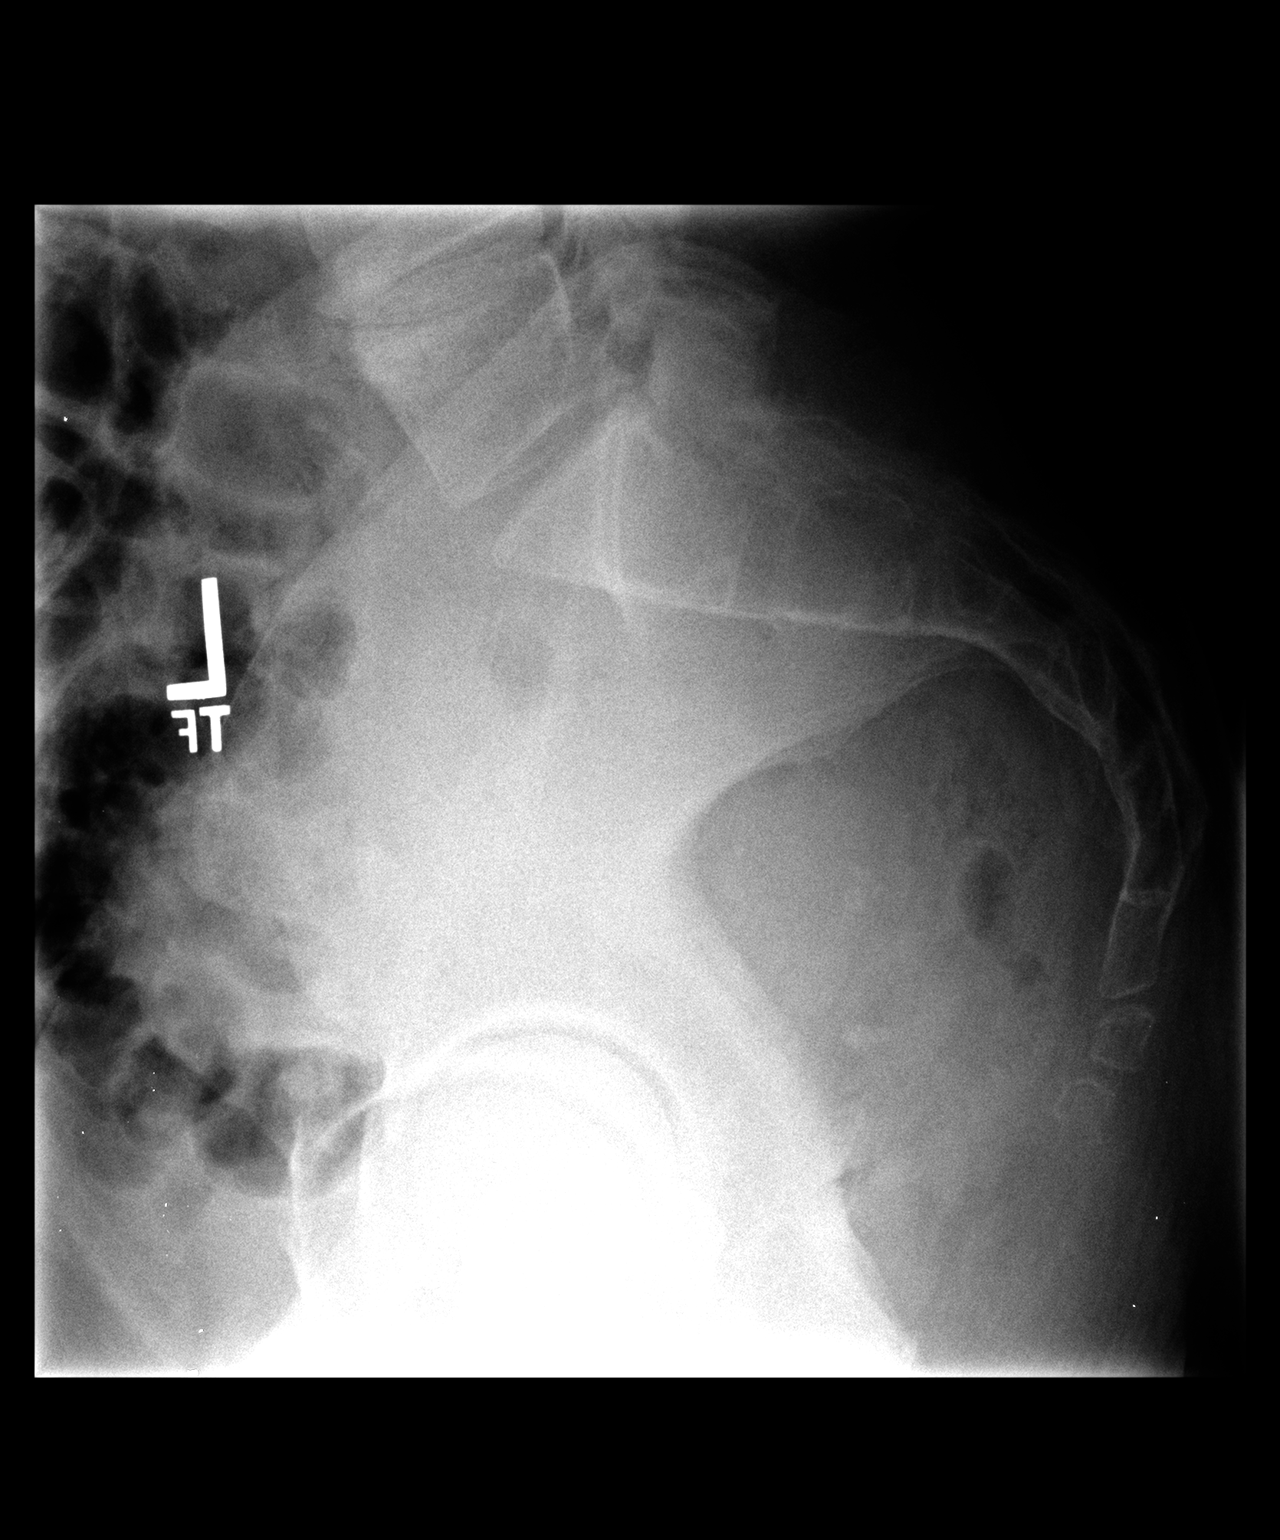

[3 of 3 positions shown; findings below may reference images not displayed]

FINDINGS: The sacral coccygeal elements are in normal alignment.
No acute fracture is seen.  The SI joints appear normal. There is
degenerative change in the lower lumbar spine.
IMPRESSION: No fracture.  Degenerative change in the lower lumbar spine.

## 2012-12-08 ENCOUNTER — Emergency Department (HOSPITAL_COMMUNITY)
Admission: EM | Admit: 2012-12-08 | Discharge: 2012-12-08 | Disposition: A | Discharge status: Home / Self Care | Payer: Self-pay | Attending: Emergency Medicine | Admitting: Emergency Medicine

## 2012-12-08 ENCOUNTER — Encounter (HOSPITAL_COMMUNITY): Payer: Self-pay | Admitting: Emergency Medicine

## 2012-12-08 DIAGNOSIS — Z79899 Other long term (current) drug therapy: Secondary | ICD-10-CM | POA: Insufficient documentation

## 2012-12-08 DIAGNOSIS — D649 Anemia, unspecified: Secondary | ICD-10-CM | POA: Insufficient documentation

## 2012-12-08 DIAGNOSIS — M2540 Effusion, unspecified joint: Secondary | ICD-10-CM | POA: Insufficient documentation

## 2012-12-08 DIAGNOSIS — M069 Rheumatoid arthritis, unspecified: Secondary | ICD-10-CM | POA: Insufficient documentation

## 2012-12-08 DIAGNOSIS — E860 Dehydration: Secondary | ICD-10-CM | POA: Insufficient documentation

## 2012-12-08 DIAGNOSIS — I10 Essential (primary) hypertension: Secondary | ICD-10-CM | POA: Insufficient documentation

## 2012-12-08 DIAGNOSIS — M2550 Pain in unspecified joint: Secondary | ICD-10-CM

## 2012-12-08 DIAGNOSIS — F17200 Nicotine dependence, unspecified, uncomplicated: Secondary | ICD-10-CM | POA: Insufficient documentation

## 2012-12-08 DIAGNOSIS — G8929 Other chronic pain: Secondary | ICD-10-CM | POA: Insufficient documentation

## 2012-12-08 DIAGNOSIS — R110 Nausea: Secondary | ICD-10-CM | POA: Insufficient documentation

## 2012-12-08 DIAGNOSIS — Z8659 Personal history of other mental and behavioral disorders: Secondary | ICD-10-CM | POA: Insufficient documentation

## 2012-12-08 DIAGNOSIS — R269 Unspecified abnormalities of gait and mobility: Secondary | ICD-10-CM | POA: Insufficient documentation

## 2012-12-08 DIAGNOSIS — R34 Anuria and oliguria: Secondary | ICD-10-CM | POA: Insufficient documentation

## 2012-12-08 LAB — CBC WITH DIFFERENTIAL/PLATELET
Basophils Relative: 0 % (ref 0–1)
Hemoglobin: 10.1 g/dL — ABNORMAL LOW (ref 12.0–15.0)
Lymphs Abs: 1.8 10*3/uL (ref 0.7–4.0)
MCHC: 30.6 g/dL (ref 30.0–36.0)
Monocytes Relative: 6 % (ref 3–12)
Neutro Abs: 5.5 10*3/uL (ref 1.7–7.7)
Neutrophils Relative %: 68 % (ref 43–77)
Platelets: 539 10*3/uL — ABNORMAL HIGH (ref 150–400)
RBC: 4.48 MIL/uL (ref 3.87–5.11)

## 2012-12-08 LAB — URINALYSIS, ROUTINE W REFLEX MICROSCOPIC
Leukocytes, UA: NEGATIVE
Nitrite: NEGATIVE
Specific Gravity, Urine: 1.01 (ref 1.005–1.030)
pH: 7 (ref 5.0–8.0)

## 2012-12-08 LAB — BASIC METABOLIC PANEL
BUN: 5 mg/dL — ABNORMAL LOW (ref 6–23)
Chloride: 99 mEq/L (ref 96–112)
GFR calc Af Amer: >90 mL/min (ref >90)
Potassium: 4.2 mEq/L (ref 3.5–5.1)
Sodium: 135 mEq/L (ref 135–145)

## 2012-12-08 LAB — URINE MICROSCOPIC-ADD ON

## 2012-12-08 MED ORDER — OXYCODONE-ACETAMINOPHEN 5-325 MG PO TABS
1.0000 | ORAL_TABLET | Freq: Once | ORAL | Status: AC
  Administered 2012-12-08: 1 via ORAL
  Filled 2012-12-08: qty 1

## 2012-12-08 MED ORDER — KETOROLAC TROMETHAMINE 30 MG/ML IJ SOLN
30.0000 mg | Freq: Once | INTRAMUSCULAR | Status: AC
  Administered 2012-12-08: 30 mg via INTRAVENOUS
  Filled 2012-12-08: qty 1

## 2012-12-08 MED ORDER — ONDANSETRON HCL 4 MG/2ML IJ SOLN
4.0000 mg | Freq: Once | INTRAMUSCULAR | Status: AC
  Administered 2012-12-08: 4 mg via INTRAVENOUS
  Filled 2012-12-08: qty 2

## 2012-12-08 MED ORDER — PROMETHAZINE HCL 12.5 MG PO TABS: 12.5000 mg | ORAL_TABLET | Freq: Four times a day (QID) | ORAL | Status: DC | PRN

## 2012-12-08 MED ORDER — NAPROXEN 500 MG PO TABS: 500.0000 mg | ORAL_TABLET | Freq: Two times a day (BID) | ORAL | Status: DC

## 2012-12-08 MED ORDER — SODIUM CHLORIDE 0.9 % IV SOLN
INTRAVENOUS | Status: DC
  Administered 2012-12-08: 13:00:00 via INTRAVENOUS

## 2012-12-08 NOTE — ED Provider Notes (Signed)
Medical screening examination/treatment/procedure(s) were performed by non-physician practitioner and as supervising physician I was immediately available for consultation/collaboration.  EKG Interpretation   None         Joya Gaskins, MD 12/08/12 1701

## 2012-12-08 NOTE — ED Provider Notes (Signed)
CSN: [id number]     Arrival date & time 12/08/12  1211 History   First MD Initiated Contact with Patient 12/08/12 1225     Chief Complaint  Patient presents with  . Joint Pain   (Consider location/radiation/quality/duration/timing/severity/associated sxs/prior Treatment) The history is provided by the patient.   [name] is a 47 y.o. female who presents to the ED via EMS with joint pain. Hx of Rheumatoid arthritis and has been out of her medication x 9 months because of loss of insurance. Aching in all joints but worse in knees, elbows, wrist and hands. Nausea and dry heaves no vomiting. Feels dehydrated.   Past Medical History  Diagnosis Date  . Rheumatoid arthritis(714.0)   . Chronic anemia   . Hypertension   . Arthritis, rheumatoid   . Incidental lung nodule   . Depression   . Chronic pain   . Chronic back pain    Past Surgical History  Procedure Laterality Date  . Tubal ligation    . Endometrial ablation    . Breast cyst excision  12/31/2010    Procedure: CYST EXCISION BREAST;  Surgeon: Fabio Bering, MD;  Location: [facility];  Service: General;  Laterality: Right;  Excision Sebaceous Cyst Right Breast   Family History  Problem Relation Age of Onset  . Anesthesia problems Neg Hx   . Cancer Father    History  Substance Use Topics  . Smoking status: Current Every Day Smoker -- 0.03 packs/day for 30 years    Types: Cigarettes  . Smokeless tobacco: Never Used  . Alcohol Use: No     Comment: occassional   OB History   Grav Para Term Preterm Abortions TAB SAB Ect Mult Living   6 5 5  1  1   5      Review of Systems  Constitutional: Negative for fever and chills.  HENT: Negative.   Eyes: Negative for visual disturbance.  Gastrointestinal: Positive for nausea. Negative for vomiting.  Genitourinary: Positive for decreased urine volume. Negative for dysuria, urgency and frequency.  Musculoskeletal: Positive for arthralgias, gait problem (due to pain) and joint  swelling.  Skin: Negative for rash.  Neurological: Negative for syncope and headaches.  Psychiatric/Behavioral: The patient is not nervous/anxious.     Allergies  Codeine  Home Medications   Current Outpatient Rx  Name  Route  Sig  Dispense  Refill  . Aspirin-Caffeine (BC FAST PAIN RELIEF ARTHRITIS PO)   Oral   Take 1 Package by mouth every 6 (six) hours as needed (Pain).         . Calcium Carbonate-Vitamin D (CALCIUM 600 + D PO)   Oral   Take 1 tablet by mouth every evening.          . ferrous sulfate 325 (65 FE) MG tablet   Oral   Take 325 mg by mouth 2 (two) times daily. For anemia          . folic acid (FOLVITE) 1 MG tablet   Oral   Take 1 mg by mouth daily.           Marland Kitchen HYDROcodone-acetaminophen (NORCO/VICODIN) 5-325 MG per tablet   Oral   Take 1 tablet by mouth every 4 (four) hours as needed for pain.   15 tablet   0   . hydroxychloroquine (PLAQUENIL) 200 MG tablet   Oral   Take 200 mg by mouth daily.         . methotrexate (RHEUMATREX) 2.5 MG  tablet   Oral   Take 12.5 mg by mouth once a week. Take 5 tablets every Monday and Tuesday :Caution:Chemotherapy. Protect from light.         . Multiple Vitamin (MULTIVITAMIN WITH MINERALS) TABS   Oral   Take 1 tablet by mouth daily.          BP 132/92  Pulse 81  Temp(Src) 98.1 F (36.7 C) (Oral)  Resp 18  Ht 5\' 6"  (1.676 m)  Wt 148 lb (67.132 kg)  BMI 23.90 kg/m2  SpO2 100% Physical Exam  Nursing note and vitals reviewed. Constitutional: She is oriented to person, place, and time. She appears well-developed and well-nourished. No distress.  HENT:  Head: Normocephalic and atraumatic.  Eyes: Conjunctivae and EOM are normal.  Neck: Neck supple.  Cardiovascular: Normal rate and regular rhythm.   Pulmonary/Chest: Effort normal and breath sounds normal.  Abdominal: Soft. There is no tenderness.  Musculoskeletal:  Joints of fingers and wrist with swelling and tenderness. Knees and ankles tender  with palpation. Radial and pedal pulses strong, adequate circulation, good touch sensation.  Neurological: She is alert and oriented to person, place, and time. She has normal strength. No cranial nerve deficit or sensory deficit. Abnormal gait: due to pain.  Skin: Skin is warm and dry.  Psychiatric: She has a normal mood and affect. Her behavior is normal.   Results for orders placed during the hospital encounter of 12/08/12 (from the past 24 hour(s))  URINALYSIS, ROUTINE W REFLEX MICROSCOPIC     Status: Abnormal   Collection Time    12/08/12  1:14 PM      Result Value Range   Color, Urine YELLOW  YELLOW   APPearance CLEAR  CLEAR   Specific Gravity, Urine 1.010  1.005 - 1.030   pH 7.0  5.0 - 8.0   Glucose, UA NEGATIVE  NEGATIVE mg/dL   Hgb urine dipstick TRACE (*) NEGATIVE   Bilirubin Urine NEGATIVE  NEGATIVE   Ketones, ur NEGATIVE  NEGATIVE mg/dL   Protein, ur NEGATIVE  NEGATIVE mg/dL   Urobilinogen, UA 0.2  0.0 - 1.0 mg/dL   Nitrite NEGATIVE  NEGATIVE   Leukocytes, UA NEGATIVE  NEGATIVE  URINE MICROSCOPIC-ADD ON     Status: None   Collection Time    12/08/12  1:14 PM      Result Value Range   Squamous Epithelial / LPF RARE  RARE   WBC, UA 0-2  <3 WBC/hpf  CBC WITH DIFFERENTIAL     Status: Abnormal   Collection Time    12/08/12  1:25 PM      Result Value Range   WBC 8.1  4.0 - 10.5 K/uL   RBC 4.48  3.87 - 5.11 MIL/uL   Hemoglobin 10.1 (*) 12.0 - 15.0 g/dL   HCT 16.1 (*) 09.6 - 04.5 %   MCV 73.7 (*) 78.0 - 100.0 fL   MCH 22.5 (*) 26.0 - 34.0 pg   MCHC 30.6  30.0 - 36.0 g/dL   RDW 40.9 (*) 81.1 - 91.4 %   Platelets 539 (*) 150 - 400 K/uL   Neutrophils Relative % 68  43 - 77 %   Neutro Abs 5.5  1.7 - 7.7 K/uL   Lymphocytes Relative 23  12 - 46 %   Lymphs Abs 1.8  0.7 - 4.0 K/uL   Monocytes Relative 6  3 - 12 %   Monocytes Absolute 0.5  0.1 - 1.0 K/uL   Eosinophils Relative 4  0 - 5 %   Eosinophils Absolute 0.3  0.0 - 0.7 K/uL   Basophils Relative 0  0 - 1 %    Basophils Absolute 0.0  0.0 - 0.1 K/uL    ED Course  Procedures  EKG Interpretation   None       MDM: Patient feeling better after Zofran and pain medication.   47 y.o. female with arthritis flair up and nausea. Has not been on medication due to no health insurance. Will give NSAIDS and medication for nausea. Patient stable for discharge without any immediate complications. [facility] and other [facility]. Patient awaiting disability.  Discussed with the patient findings and plan of care. All questioned fully answered. She will return if any problems arise.    Medication List    TAKE these medications       naproxen 500 MG tablet  Commonly known as:  NAPROSYN  Take 1 tablet (500 mg total) by mouth 2 (two) times daily.     promethazine 12.5 MG tablet  Commonly known as:  PHENERGAN  Take 1 tablet (12.5 mg total) by mouth every 6 (six) hours as needed for nausea or vomiting.      ASK your doctor about these medications       BC FAST PAIN RELIEF ARTHRITIS PO  Take 1 Package by mouth every 6 (six) hours as needed (Pain).     CALCIUM 600 + D PO  Take 1 tablet by mouth every evening.     ferrous sulfate 325 (65 FE) MG tablet  Take 325 mg by mouth 2 (two) times daily. For anemia     folic acid 1 MG tablet  Commonly known as:  FOLVITE  Take 1 mg by mouth daily.     multivitamin with minerals Tabs tablet  Take 1 tablet by mouth daily.           Janne Napoleon, NP 12/08/12 [phone]

## 2012-12-08 NOTE — ED Notes (Signed)
Pt reports has been off of her rheumatoid arthritis medication since she lost her insurance and could no longer afford it.  Pt reports joint pain has been severe for the past 2 days.  Reports pain has been so severe she is nauseated and unable to eat.

## 2013-01-04 IMAGING — CT CT ABD-PELV W/ CM
2 of 5 series · 16 of 46 positions shown, 18 images · IV contrast (Omnipaque 300)
Comparison: 09/19/2010

CLINICAL DATA: Lower abdominal and pelvic pain.

CT ABDOMEN AND PELVIS WITH CONTRAST
TECHNIQUE: Multidetector CT imaging of the abdomen and pelvis was
performed following the standard protocol during bolus
administration of intravenous contrast.
Contrast: 100mL OMNIPAQUE IOHEXOL 300 MG/ML IV SOLN

[Series 2: abd_pel_with 5.0 b40f · axial · 0.69mm/px · z∈[-476,-116]mm · 13 of 82 slices shown, 15 images]
[im 5/82  soft-tissue]
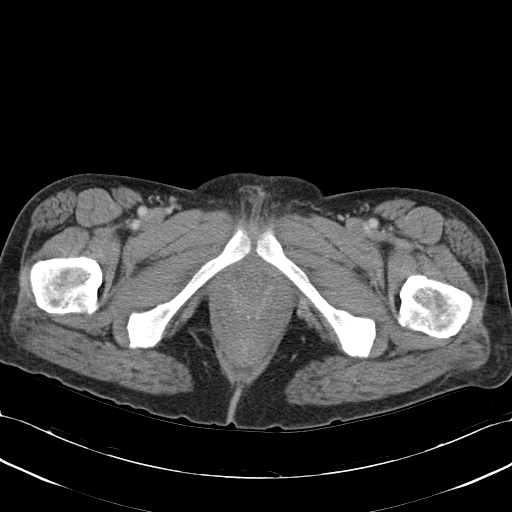
[im 5/82  bone]
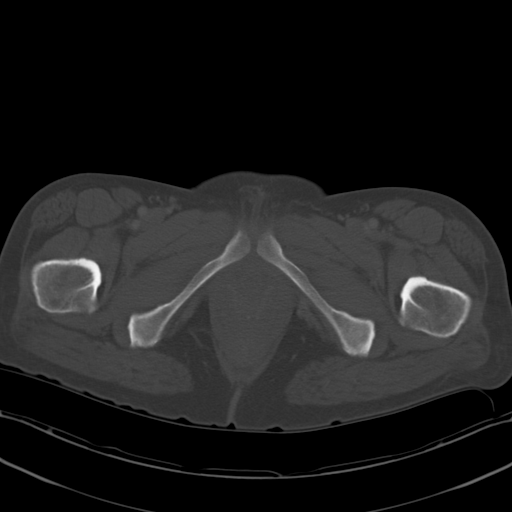
[im 13/82  soft-tissue]
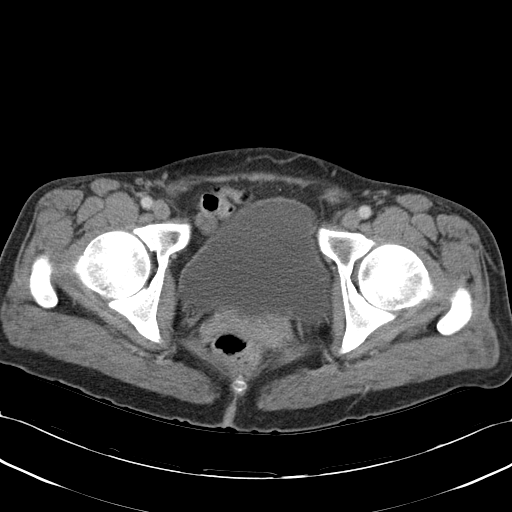
[im 18/82  soft-tissue]
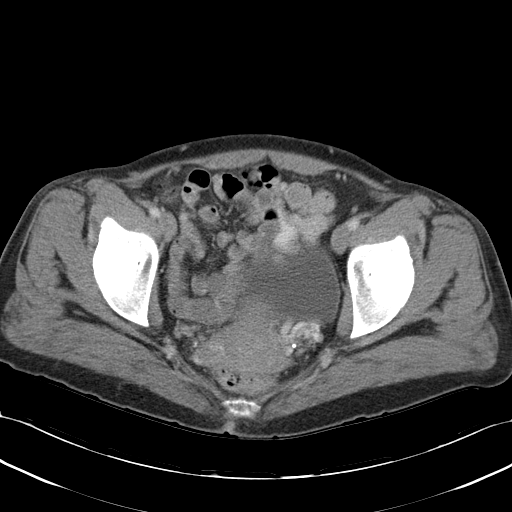
[im 22/82  soft-tissue]
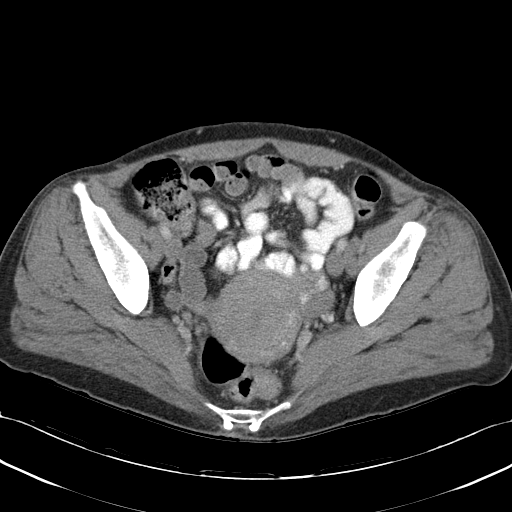
[im 30/82  soft-tissue]
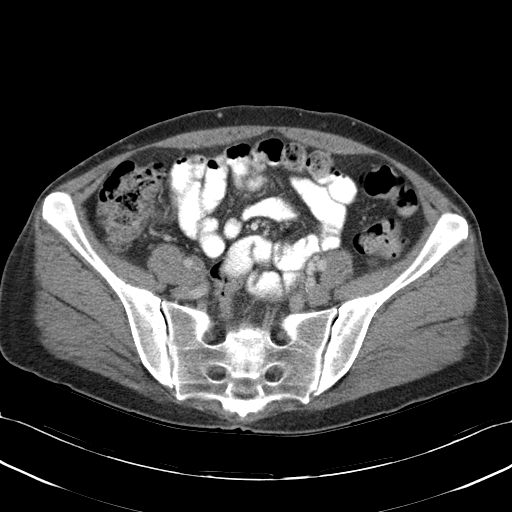
[im 35/82  soft-tissue]
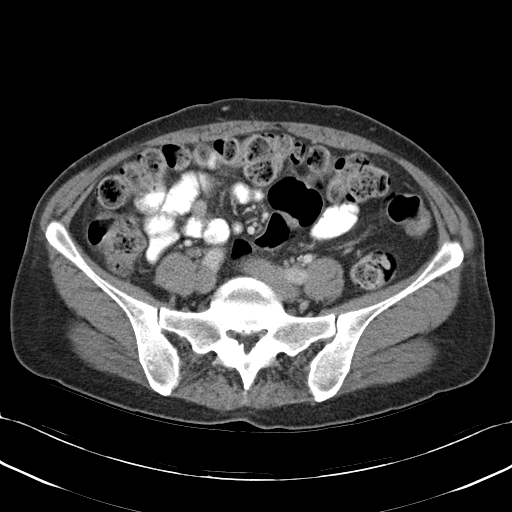
[im 43/82  soft-tissue]
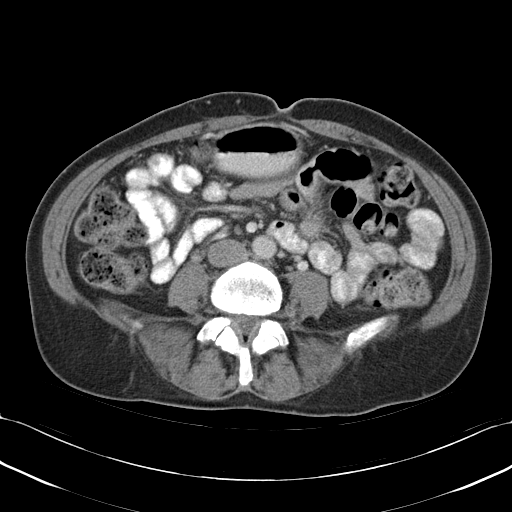
[im 47/82  soft-tissue]
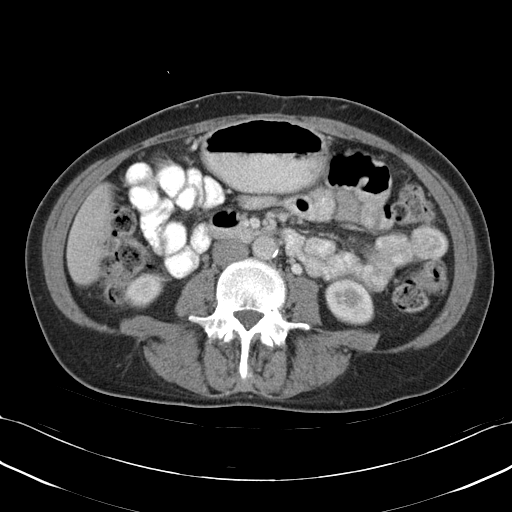
[im 52/82  soft-tissue]
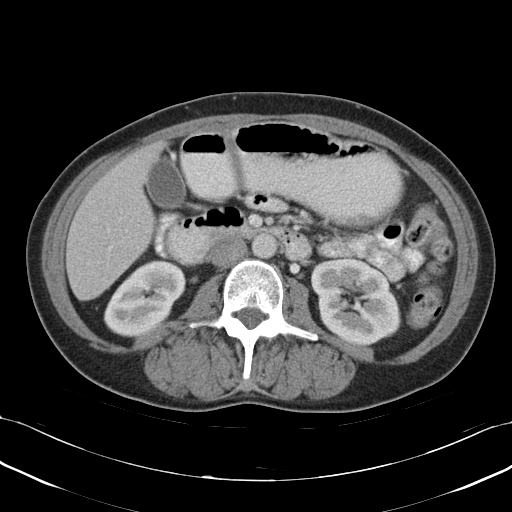
[im 52/82  bone]
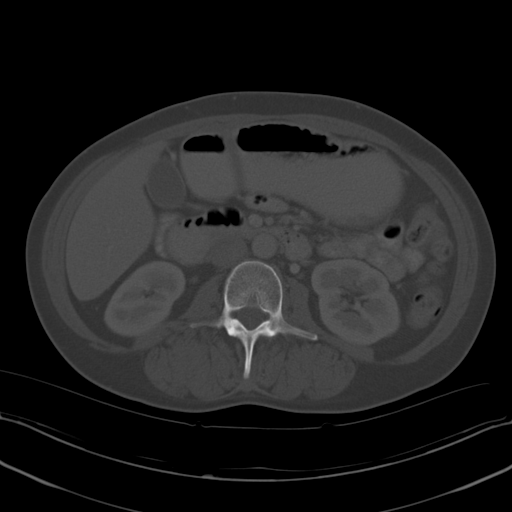
[im 60/82  soft-tissue]
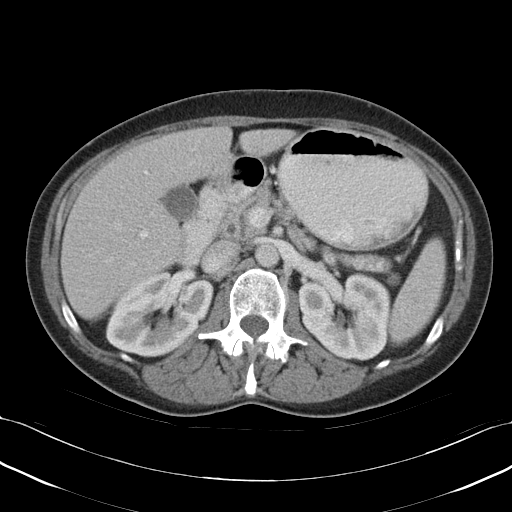
[im 64/82  soft-tissue]
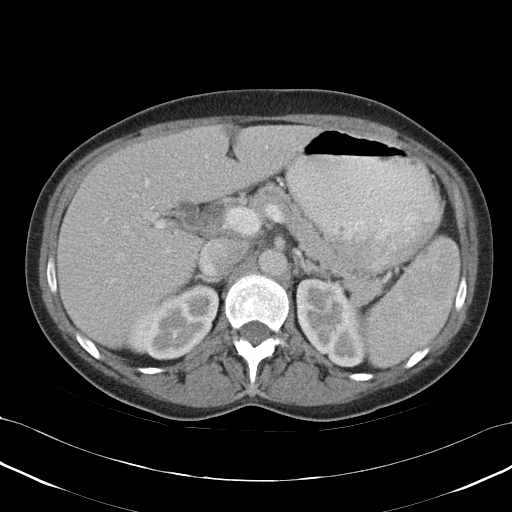
[im 69/82  soft-tissue]
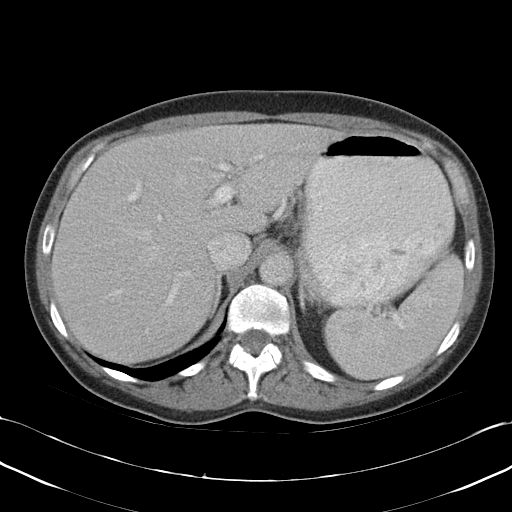
[im 77/82  soft-tissue]
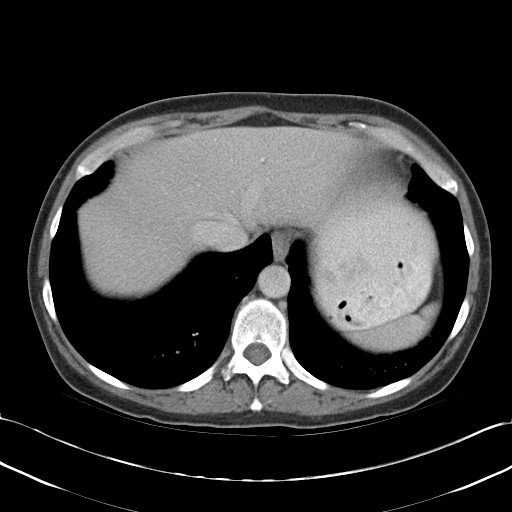

[Series 4: abd_pel_with 3.0 spo · coronal · 0.66mm/px · 3 of 71 slices shown]
[im 24/71  soft-tissue]
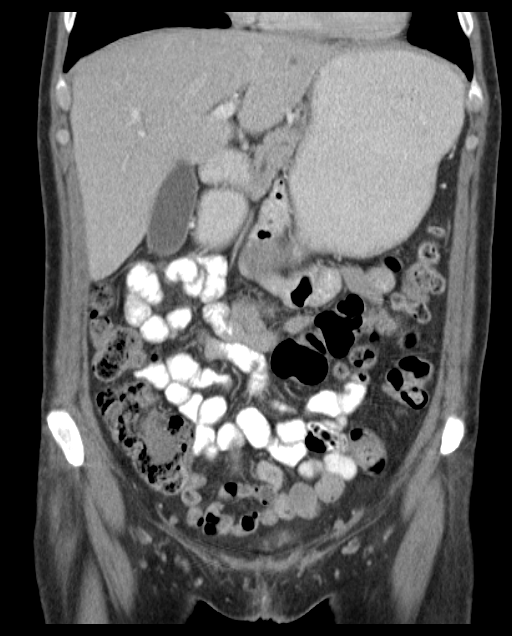
[im 32/71  soft-tissue]
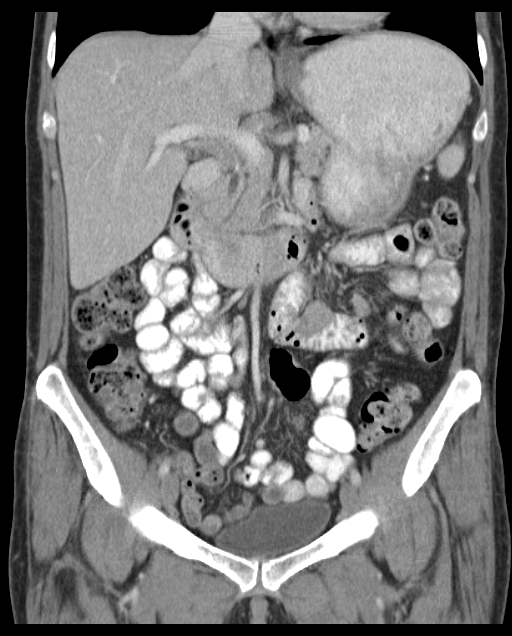
[im 39/71  soft-tissue]
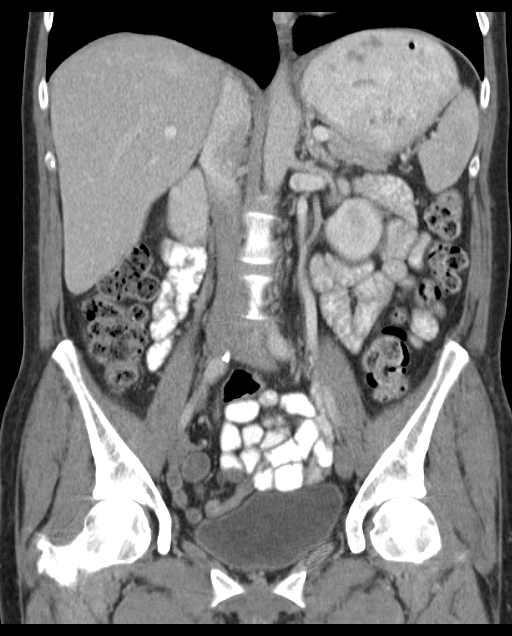

[16 of 46 positions shown; findings below may reference images not displayed]

FINDINGS: No focal abnormalities seen in the liver or spleen.  The
stomach, duodenum, pancreas, gallbladder, and adrenal glands are
normal.  The kidneys are normal bilaterally.

No abdominal aortic aneurysm.  No lymphadenopathy in the abdomen.
No free fluid in the abdomen.  Abdominal bowel loops are
unremarkable.

Imaging through the pelvis shows no free fluid.  Bladder is normal.
Uterus is unremarkable.  There is no adnexal mass.  No pelvic
sidewall lymphadenopathy.

No evidence for colonic diverticulitis.  No substantial
diverticular changes seen in the colon.  The terminal ileum is
normal.  Appendix is normal.

Bone windows reveal no worrisome lytic or sclerotic osseous
lesions.
IMPRESSION: Stable exam.  No acute findings in the abdomen or pelvis.
Specifically, no findings to explain the patient's history of
abdominal and pelvic pain.

## 2013-02-02 ENCOUNTER — Encounter (HOSPITAL_COMMUNITY): Payer: Self-pay | Admitting: Emergency Medicine

## 2013-02-02 ENCOUNTER — Emergency Department (HOSPITAL_COMMUNITY)
Admission: EM | Admit: 2013-02-02 | Discharge: 2013-02-02 | Disposition: A | Discharge status: Home / Self Care | Payer: Self-pay | Attending: Emergency Medicine | Admitting: Emergency Medicine

## 2013-02-02 DIAGNOSIS — D649 Anemia, unspecified: Secondary | ICD-10-CM | POA: Insufficient documentation

## 2013-02-02 DIAGNOSIS — F17200 Nicotine dependence, unspecified, uncomplicated: Secondary | ICD-10-CM | POA: Insufficient documentation

## 2013-02-02 DIAGNOSIS — M069 Rheumatoid arthritis, unspecified: Secondary | ICD-10-CM | POA: Insufficient documentation

## 2013-02-02 DIAGNOSIS — I10 Essential (primary) hypertension: Secondary | ICD-10-CM | POA: Insufficient documentation

## 2013-02-02 DIAGNOSIS — M2550 Pain in unspecified joint: Secondary | ICD-10-CM | POA: Insufficient documentation

## 2013-02-02 DIAGNOSIS — Z8659 Personal history of other mental and behavioral disorders: Secondary | ICD-10-CM | POA: Insufficient documentation

## 2013-02-02 DIAGNOSIS — Z79899 Other long term (current) drug therapy: Secondary | ICD-10-CM | POA: Insufficient documentation

## 2013-02-02 DIAGNOSIS — G8929 Other chronic pain: Secondary | ICD-10-CM | POA: Insufficient documentation

## 2013-02-02 MED ORDER — OXYCODONE-ACETAMINOPHEN 5-325 MG PO TABS
ORAL_TABLET | ORAL | Status: AC
  Filled 2013-02-02: qty 1

## 2013-02-02 MED ORDER — OXYCODONE-ACETAMINOPHEN 5-325 MG PO TABS: ORAL_TABLET | ORAL | Status: DC

## 2013-02-02 MED ORDER — OXYCODONE-ACETAMINOPHEN 5-325 MG PO TABS
2.0000 | ORAL_TABLET | Freq: Once | ORAL | Status: AC
  Administered 2013-02-02: 2 via ORAL
  Filled 2013-02-02: qty 2

## 2013-02-02 MED ORDER — NAPROXEN 250 MG PO TABS: 250.0000 mg | ORAL_TABLET | Freq: Two times a day (BID) | ORAL | Status: DC

## 2013-02-02 NOTE — Discharge Instructions (Signed)
°Emergency Department Resource Guide °1) Find a Doctor and Pay Out of Pocket °Although you won't have to find out who is covered by your insurance plan, it is a good idea to ask around and get recommendations. You will then need to call the office and see if the doctor you have chosen will accept you as a new patient and what types of options they offer for patients who are self-pay. Some doctors offer discounts or will set up payment plans for their patients who do not have insurance, but you will need to ask so you aren't surprised when you get to your appointment. ° °2) Contact Your Local Health Department °Not all health departments have doctors that can see patients for sick visits, but many do, so it is worth a call to see if yours does. If you don't know where your local health department is, you can check in your phone book. The CDC also has a tool to help you locate your state's health department, and many state websites also have listings of all of their local health departments. ° °3) Find a Walk-in Clinic °If your illness is not likely to be very severe or complicated, you may want to try a walk in clinic. These are popping up all over the country in pharmacies, drugstores, and shopping centers. They're usually staffed by nurse practitioners or physician assistants that have been trained to treat common illnesses and complaints. They're usually fairly quick and inexpensive. However, if you have serious medical issues or chronic medical problems, these are probably not your best option. ° °No Primary Care Doctor: °- Call [facility] at  832-8000 - they can help you locate a primary care doctor that  accepts your insurance, provides certain services, etc. °- Physician Referral Service- 1-800-533-3463 ° °Chronic Pain Problems: °Organization         Address  Phone   Notes  °[facility]  (336) [phone] Patients need to be referred by their primary care doctor.  ° °Medication  Assistance: °Organization         Address  Phone   Notes  °[place] [facility] [address] °[place], [place] [address] (336) 641-8030 --Must be a resident of [place] °-- Must have NO insurance coverage whatsoever (no Medicaid/ Medicare, etc.) °-- The pt. MUST have a primary care doctor that directs their care regularly and follows them in the community °  °[facility]  (866) 331-1348   °[employer]  (888) 892-1162   ° °Agencies that provide inexpensive medical care: °Organization         Address  Phone   Notes  °[facility]  (336) 832-8035   °[facility]    (336) 832-7272   °[facility] [address] °[place], [place] [address] (336) 832-4777   °[facility] [address], °[place] (336) 271-4999   °[facility]    (336) 373-0678   °[facility]    (336) 272-1050   °[facility] ° [address], [place] Phone:  (336) 832-4444, Fax:  (336) 832-4440 Hours of Operation:  9 am - 6 pm, M-F.  Also accepts Medicaid/Medicare and self-pay.  °[facility] ° [address], [place] Phone: (336) [phone], Fax: (336) [phone]. Hours of Operation:  8:30 am - 5:30 pm, M-F.  Also accepts Medicaid and self-pay.  °[facility] [place] [address], [place] Phone: (336) 878-6027   °[facility] [address], [place], [place] (336)723-1848, Ext. 123 Mondays & Thursdays: 7-9 AM.  First 15 patients are seen on a first come, first serve basis. °  ° °Medicaid-accepting [place] Providers: ° °Organization         Address  Phone   Notes  °[facility] [address], [place] (336) 641-2100 Also accepts self-pay patients.  °[facility] [address], [place] ° (336) 856-9996   °[facility] [address], [place]  (336) 288-8857   °[facility] [address] [place] [address], [place] (336) 299-7000   °Veita Bland [address], [place]  ° (336) 373-1557 Only accepts [place] Access Medicaid patients after they have their name applied to their card.  ° °Self-Pay (no insurance) in [place]: ° °Organization         Address  Phone   Notes  °Sickle Cell Patients, [facility] [address], [place] (336) 832-1970   °[facility] [address], [place] (336) 832-4400   ° [facility] ° [address], [place] (336) 992-4800   °[facility]/Dr. Osei-Bonsu ° [address] [place] [address], [place] or [address], [place] (336) [phone] Phone number for both [place] and [place] locations is the same.  °[facility] [address], [place] (336) 299-0000   °[facility] [address] [place] [address], [place] or [address] (336) 852-7530 °(336) 878-2260   °[facility] [address], [place] (336) 350-1642, phone; (336) 294-5005, fax Sees patients 1st and 3rd Saturday of every month.  Must not qualify for public or private insurance (i.e. Medicaid, Medicare, [place] Health Choice, Veterans' Benefits) • Household income should be no more than 200% of the poverty level •The clinic cannot treat you if you are pregnant or think you are pregnant • Sexually transmitted diseases are not treated at the clinic.  ° ° °Dental Care: °Organization         Address  Phone  Notes  °[place] [facility] [address], [place] (336) 641-6152 Accepts children up to age 21 who are enrolled in Medicaid or [place] Health Choice; pregnant women with a Medicaid card; and children who have applied for Medicaid or [place] Health Choice, but were declined, whose parents can pay a reduced fee at time of service.  °[place]  [facility] [place]  [address], [place] (336) 641-7733 Accepts children up to age 21 who are enrolled in Medicaid or [place] Health Choice; pregnant women with a Medicaid card; and children who have applied for Medicaid or [place] Health Choice, but were declined, whose parents can pay a reduced fee at time of service.  °[facility] ° [address], [place] (336) 641-4533 Patients are seen by appointment only. Walk-ins are not accepted. [facility] will see patients 18 years of age and older. °Monday - Tuesday (8am-5pm) °Most Wednesdays (8:30-5pm) °$30 per visit, cash only  °[facility] ° [address], [place] (336) 641-4533 Patients are seen by appointment only. Walk-ins are not accepted. [facility] will see patients 18 years of age and older. °One   Wednesday Evening (Monthly: Volunteer Based).  $30 per visit, cash only  °[facility]  (919) 537-3737 for adults; Children under age 4, call Graduate Pediatric Dentistry at ([phone]) [phone]. Children aged 4-14, please call ([phone]) [phone] to request a pediatric application. ° Dental services are provided in all areas of dental care including fillings, crowns and bridges, complete and partial dentures, implants, gum treatment, root canals, and extractions. Preventive care is also provided. Treatment is provided to both adults and children. °Patients are selected via a lottery and there is often a waiting list. °  °[facility] [address], °[place] ° (336) 763-8833 [email or web address] °  °[facility] [address], [place], [place] (336)723-1848, Ext. 123 Second and Fourth Thursday of each month, opens at 6:30 AM; Clinic ends at 9 AM.  Patients are seen on a first-come first-served basis, and a limited number are seen during each clinic.  ° °[facility] ° [address], [place], [place] (336) 723-7904    Eligibility Requirements °You must have lived in [place], [place], or [place] counties for at least the last three months. °  You cannot be eligible for state or federal sponsored healthcare insurance, including Veterans Administration, Medicaid, or Medicare. °  You generally cannot be eligible for healthcare insurance through your employer.  °  How to apply: °Eligibility screenings are held every Tuesday and Wednesday afternoon from 1:00 pm until 4:00 pm. You do not need an appointment for the interview!  °[facility] [address], [place], [place] 336-631-2330   °[facility] [facility]  336-342-8273   °[facility]  336-703-3100   °[facility]  336-570-6415   ° °Behavioral Health Resources in the Community: °Intensive Outpatient Programs °Organization         Address  Phone  Notes  °[facility] [address], [place], [place] 336-878-6098   °[facility] [address], [place], [place] 336-832-9800   °[facility] [address], [place], [place] ° 336-882-2125   °[facility] [address],  °[place], [place] 1-800-853-5163 or 336-641-4981   °Substance Abuse Resources °Organization         Address  Phone  Notes  °[facility]  336-882-2125   °[facility]  336-784-9470   °[facility]  336-285-9073   °[facility]  336-845-3988   °Residential & Outpatient Substance Abuse Program  1-800-659-3381   °Psychological Services °Organization         Address  Phone  Notes  °[facility]  336- 832-9600   °[facility]  336- 378-7881   °[facility] [address], [place] 1-800-853-5163 or 336-641-4981   ° °Mobile Crisis Teams °Organization         Address  Phone  Notes  °[facility], Mobile Crisis Care Unit  1-877-626-1772   °Assertive °[facility] ° [address]  [place], [place] 336-834-9664   °Sharon DeEsch [address] °[place] 336-554-5454   ° °Self-Help/Support Groups °Organization         Address  Phone             Notes  °[facility] [place] - variety of support groups  336- 373-1402 Call for more information  °Narcotics Anonymous (NA), [facility] [address], °[place]  2 meetings at this location  ° °  Residential Treatment Programs °Organization         Address  Phone  Notes  °[facility] [address],    °[place]  1-866-801-8205   °[facility] ° [address], [place], [place] 704-293-8524   °[facility] [address], [place] 336-845-3988 Admissions: 8am-3pm M-F  °[facility] [address]    °[place], [place] 336-841-1104   °[facility] [address], [place], [place] 336-379-7146   °[facility] [address]  °[place], [place] 336-285-9073   °[facility] - Intensive Outpatient [address], [place], [place] 336-852-3033   °[facility] [address]  °[place], [place] 1-877-615-2722 or 336-784-9470   °[facility] [address] [place], [place] 336-227-7417 Accepts Medicaid  °[facility] [address]  °[place] 1-800-659-3381 Substance Abuse/Addiction Treatment  ° °[place] Behavioral Health Resources °Organization         Address  Phone  Notes  °[facility]  (888) 581-9988   °Julie Brannon, PhD [address] [place], [place]   (336) 349-5553 or (336) 951-0000   °[facility]   [address] °[place], [place] (336) 349-4454   °[facility] [address], [place], [place] (336) 342-8316 Insurance/Medicaid/sponsorship through [facility]  °[facility] [address]                                    [place], [place] (336) 342-8316 Therapy/tele-psych/case    °[facility] [address]  ° [place], [place] (336) 349-2233    °Dr. Arfeen  (336) 349-4544   °[facility] [place]  [employer] [place] [facility] 1) [address], [place] °2) [address], [place] °3)  [address], [place] (336) 349-3220 °(336) 342-7768 ° °(336) 342-8140   °[place] [facility] (336) 342-1394 or (336) 342-3537 (After Hours)    ° ° ° °Take the prescriptions as directed.  Apply moist heat or ice to the area(s) of discomfort, for 15 minutes at a time, several times per day for the next few days.  Do not fall asleep on a heating or ice pack.  Call your regular medical doctor today to schedule a follow up appointment within the next 3 days.  Return to the Emergency Department immediately if worsening. ° °

## 2013-02-02 NOTE — ED Notes (Signed)
Pt states she has been out of her methotrexate for two months. States she lost her insurance and could not afford the medication. Complain of joint pain all over

## 2013-02-02 NOTE — ED Notes (Signed)
Pt reports has rheumatoid arthritis and has been out of her meds x 2 months.   Reports started a new job this week and says has made pain worse.

## 2013-02-02 NOTE — ED Provider Notes (Signed)
CSN: [id number]     Arrival date & time 02/02/13  7564 History   First MD Initiated Contact with Patient 02/02/13 [phone]     Chief Complaint  Patient presents with  . chronic pain     HPI Pt was seen at 0745. Per pt, c/o gradual onset and persistence of constant acute flair of her chronic "pains all over" for the past 1 year, worse over the past 2 months. Pt states her pains began after she ran out of her usual meds for her RA. Describes the pains as "aching" in all her joints, and per her usual chronic pain pattern. States she has a PMD appt in 5 days and "just need some pain meds until then." Denies fevers, no specific joint pain, no joint swelling/erythema/warmth, no focal motor weakness, no tingling/numbness in extremities. The symptoms have been associated with no other complaints. The patient has a significant history of similar symptoms previously, recently being evaluated for this complaint and multiple prior evals for same.       Past Medical History  Diagnosis Date  . Rheumatoid arthritis(714.0)   . Chronic anemia   . Hypertension   . Arthritis, rheumatoid   . Incidental lung nodule   . Depression   . Chronic pain   . Chronic back pain    Past Surgical History  Procedure Laterality Date  . Tubal ligation    . Endometrial ablation    . Breast cyst excision  12/31/2010    Procedure: CYST EXCISION BREAST;  Surgeon: Fabio Bering, MD;  Location: [facility];  Service: General;  Laterality: Right;  Excision Sebaceous Cyst Right Breast   Family History  Problem Relation Age of Onset  . Anesthesia problems Neg Hx   . Cancer Father    History  Substance Use Topics  . Smoking status: Current Every Day Smoker -- 0.03 packs/day for 30 years    Types: Cigarettes  . Smokeless tobacco: Never Used  . Alcohol Use: No     Comment: occassional   OB History   Grav Para Term Preterm Abortions TAB SAB Ect Mult Living   6 5 5  1  1   5      Review of Systems ROS: Statement: All  systems negative except as marked or noted in the HPI; Constitutional: Negative for fever and chills. ; ; Eyes: Negative for eye pain, redness and discharge. ; ; ENMT: Negative for ear pain, hoarseness, nasal congestion, sinus pressure and sore throat. ; ; Cardiovascular: Negative for chest pain, palpitations, diaphoresis, dyspnea and peripheral edema. ; ; Respiratory: Negative for cough, wheezing and stridor. ; ; Gastrointestinal: Negative for nausea, vomiting, diarrhea, abdominal pain, blood in stool, hematemesis, jaundice and rectal bleeding. . ; ; Genitourinary: Negative for dysuria, flank pain and hematuria. ; ; Musculoskeletal: +chronic joint pain. Negative for back pain and neck pain. Negative for swelling and trauma.; ; Skin: Negative for pruritus, rash, abrasions, blisters, bruising and skin lesion.; ; Neuro: Negative for headache, lightheadedness and neck stiffness. Negative for weakness, altered level of consciousness , altered mental status, extremity weakness, paresthesias, involuntary movement, seizure and syncope.       Allergies  Codeine  Home Medications   Current Outpatient Rx  Name  Route  Sig  Dispense  Refill  . Aspirin-Caffeine (BC FAST PAIN RELIEF ARTHRITIS PO)   Oral   Take 1 Package by mouth every 6 (six) hours as needed (Pain).         . Calcium Carbonate-Vitamin  D (CALCIUM 600 + D PO)   Oral   Take 1 tablet by mouth every evening.          . ferrous sulfate 325 (65 FE) MG tablet   Oral   Take 325 mg by mouth 2 (two) times daily. For anemia          . folic acid (FOLVITE) 1 MG tablet   Oral   Take 1 mg by mouth daily.           . Multiple Vitamin (MULTIVITAMIN WITH MINERALS) TABS   Oral   Take 1 tablet by mouth daily.         . naproxen (NAPROSYN) 250 MG tablet   Oral   Take 1 tablet (250 mg total) by mouth 2 (two) times daily with a meal.   14 tablet   0   . naproxen (NAPROSYN) 500 MG tablet   Oral   Take 1 tablet (500 mg total) by mouth 2  (two) times daily.   20 tablet   0   . oxyCODONE-acetaminophen (PERCOCET/ROXICET) 5-325 MG per tablet      1 or 2 tabs PO q6h prn pain   25 tablet   0   . promethazine (PHENERGAN) 12.5 MG tablet   Oral   Take 1 tablet (12.5 mg total) by mouth every 6 (six) hours as needed for nausea or vomiting.   30 tablet   0    BP 144/96  Pulse 107  Temp(Src) 98.7 F (37.1 C) (Oral)  Resp 20  Ht 5\' 6"  (1.676 m)  Wt 140 lb (63.504 kg)  BMI 22.61 kg/m2  SpO2 100% Physical Exam 0750: Physical examination:  Nursing notes reviewed; Vital signs and O2 SAT reviewed;  Constitutional: Well developed, Well nourished, Well hydrated, In no acute distress; Head:  Normocephalic, atraumatic; Eyes: EOMI, PERRL, No scleral icterus; ENMT: TM's clear bilat. Mouth and pharynx normal, Mucous membranes moist; Neck: Supple, Full range of motion, No lymphadenopathy; Cardiovascular: Regular rate and rhythm, No murmur, rub, or gallop; Respiratory: Breath sounds clear & equal bilaterally, No rales, rhonchi, wheezes.  Speaking full sentences with ease, Normal respiratory effort/excursion; Chest: Nontender, Movement normal; Abdomen: Soft, Nontender, Nondistended, Normal bowel sounds; Genitourinary: No CVA tenderness; Spine:  No midline CS, TS, LS tenderness.;; Extremities: Pulses normal, No deformity. No specific joints edema, erythema, or warmth. No tenderness, No calf edema or asymmetry.; Neuro: AA&Ox3, Major CN grossly intact.  Speech clear. No gross focal motor or sensory deficits in extremities. Climbs on and off stretcher easily by herself. Gait steady.; Skin: Color normal, Warm, Dry. No rash.   ED Course  Procedures    EKG Interpretation   None       MDM  MDM Reviewed: previous chart, nursing note and vitals Reviewed previous: labs     0800:  Long hx of chronic pain with multiple ED visits for same.  Pt endorses acute flair of her usual long standing chronic pain today, no change from her usual chronic  pain pattern.  Pt encouraged to keep her previously scheduled f/u appt in 5 days with her PMD, Rheum MD, and Pain Management doctor for good continuity of care and control of her chronic pain. Aware she will only be rx a limited amount of narcotic pain meds today.  Verb understanding.     , DO 02/05/13 1156

## 2013-03-12 IMAGING — CR DG LUMBAR SPINE COMPLETE 4+V
5 series · 5 of 5 positions shown · non-contrast
Comparison: 12/23/2010.

CLINICAL DATA: Back pain following lifting injury.

LUMBAR SPINE - COMPLETE 4+ VIEW

[view not recorded (1 of 5)]
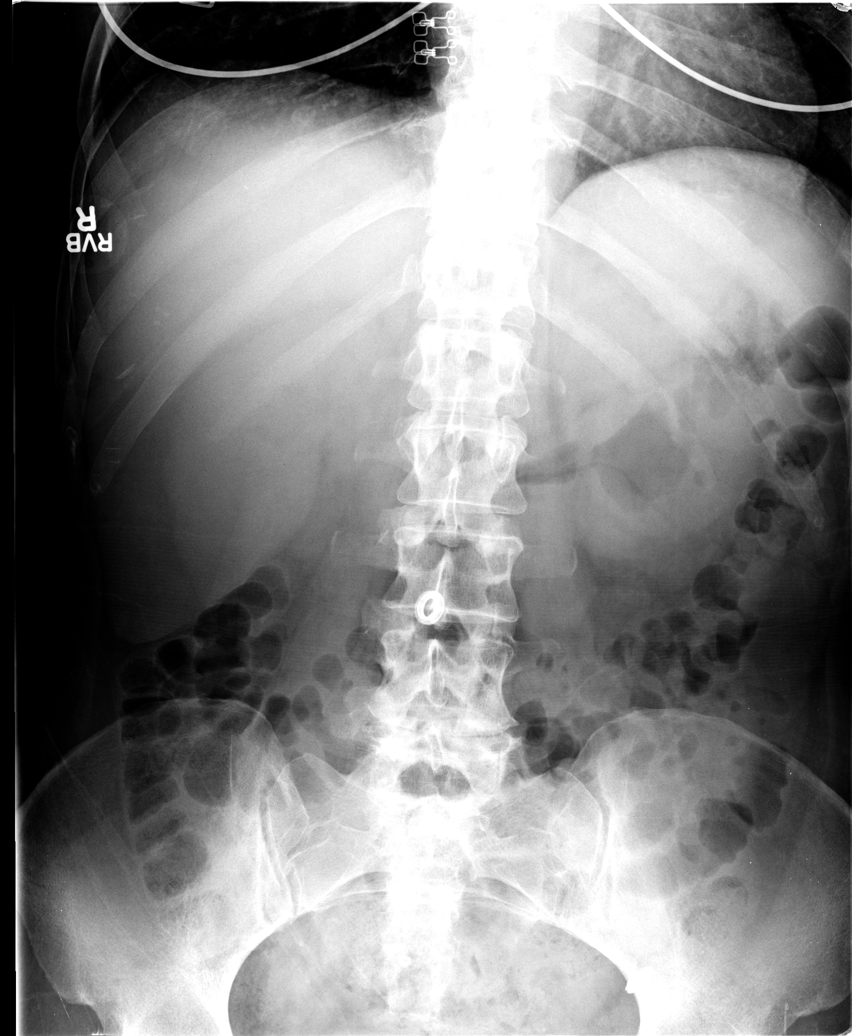

[view not recorded (2 of 5)]
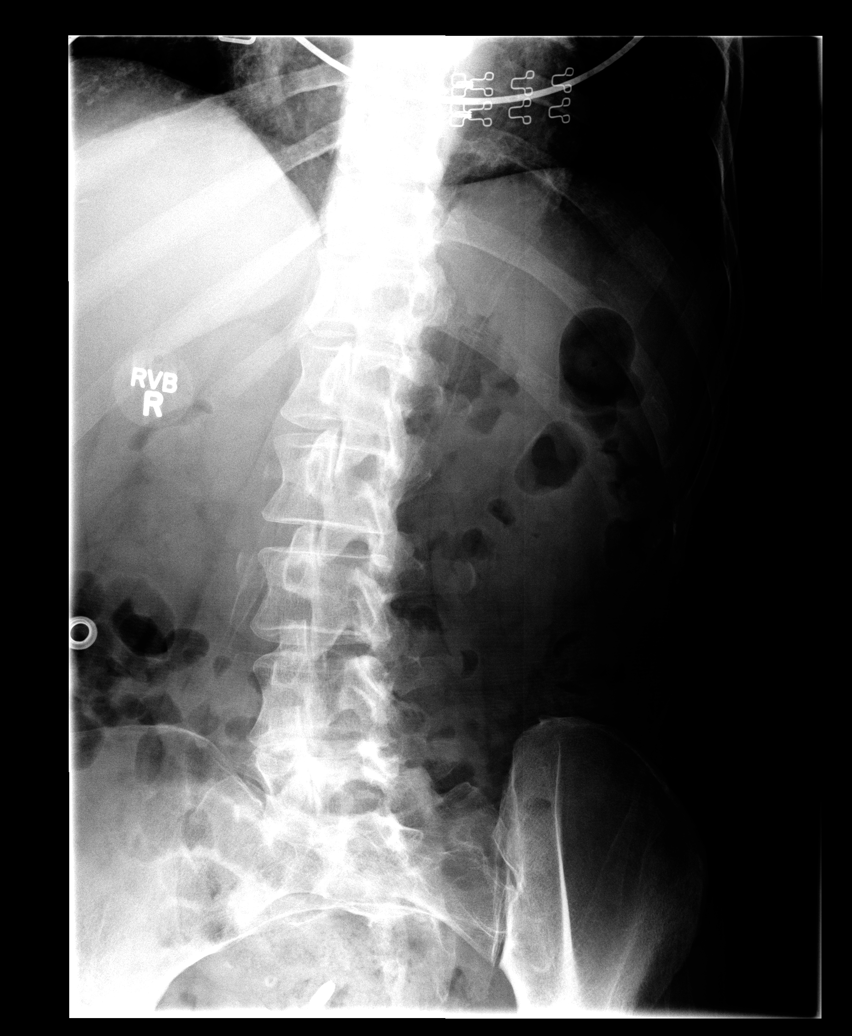

[view not recorded (3 of 5)]
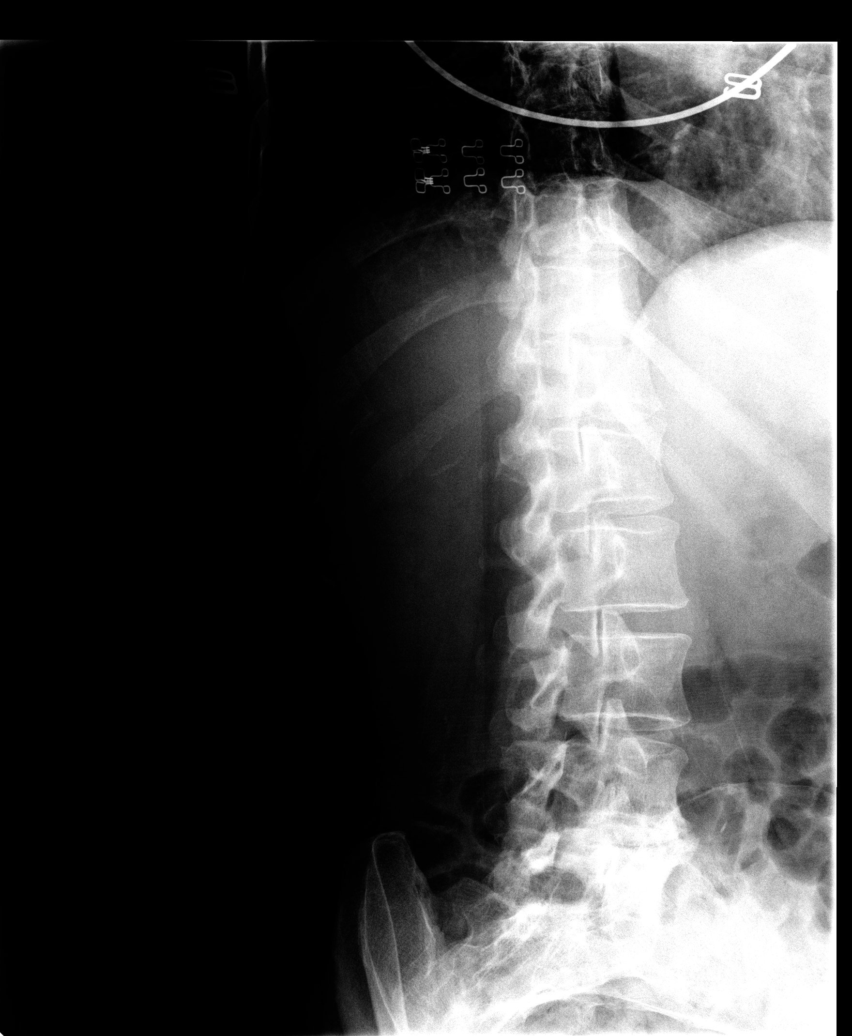

[view not recorded (4 of 5)]
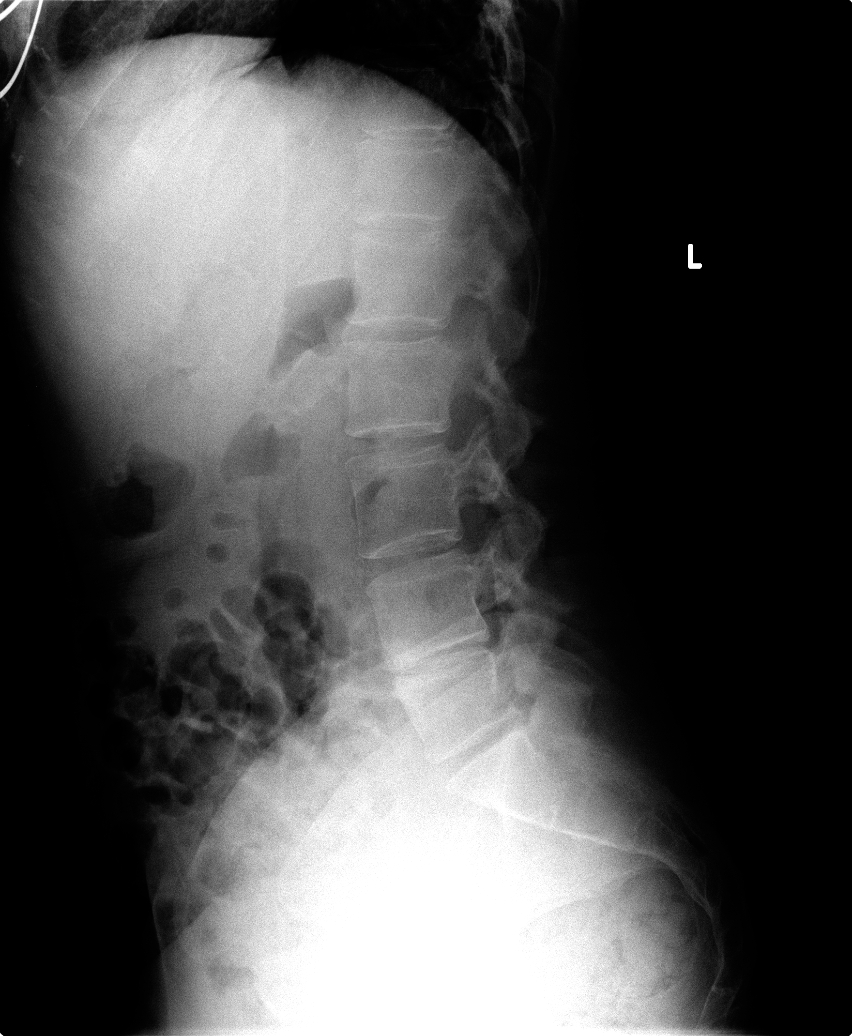

[view not recorded (5 of 5)]
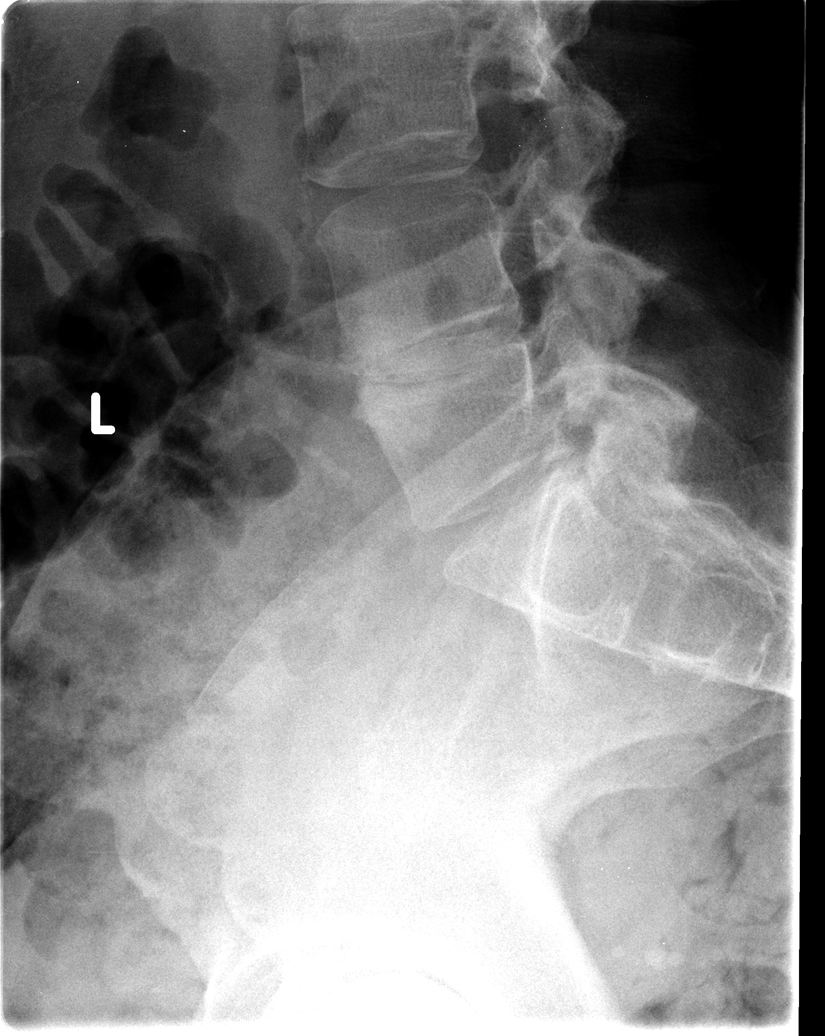

[5 of 5 positions shown; findings below may reference images not displayed]

FINDINGS: Five lumbar type vertebral bodies are present. The
alignment is anatomic.  Vertebral body height is preserved.  L4-L5
lumbar spondylosis with degenerative endplate changes, loss of the
disc space and osteophytes.  Compared to the prior exam of
12/23/2010, there is no interval change.  No fracture is
identified.
IMPRESSION: L4-L5 the lumbar spondylosis.  No acute osseous abnormality.

## 2013-05-01 ENCOUNTER — Emergency Department (HOSPITAL_COMMUNITY): Payer: Self-pay

## 2013-05-01 ENCOUNTER — Encounter (HOSPITAL_COMMUNITY): Payer: Self-pay | Admitting: Emergency Medicine

## 2013-05-01 ENCOUNTER — Emergency Department (HOSPITAL_COMMUNITY)
Admission: EM | Admit: 2013-05-01 | Discharge: 2013-05-01 | Disposition: A | Discharge status: Home / Self Care | Payer: Self-pay | Attending: Emergency Medicine | Admitting: Emergency Medicine

## 2013-05-01 DIAGNOSIS — S199XXA Unspecified injury of neck, initial encounter: Secondary | ICD-10-CM

## 2013-05-01 DIAGNOSIS — M549 Dorsalgia, unspecified: Secondary | ICD-10-CM

## 2013-05-01 DIAGNOSIS — M069 Rheumatoid arthritis, unspecified: Secondary | ICD-10-CM | POA: Insufficient documentation

## 2013-05-01 DIAGNOSIS — Z8659 Personal history of other mental and behavioral disorders: Secondary | ICD-10-CM | POA: Insufficient documentation

## 2013-05-01 DIAGNOSIS — Z79899 Other long term (current) drug therapy: Secondary | ICD-10-CM | POA: Insufficient documentation

## 2013-05-01 DIAGNOSIS — D649 Anemia, unspecified: Secondary | ICD-10-CM | POA: Insufficient documentation

## 2013-05-01 DIAGNOSIS — G8929 Other chronic pain: Secondary | ICD-10-CM | POA: Insufficient documentation

## 2013-05-01 DIAGNOSIS — S298XXA Other specified injuries of thorax, initial encounter: Secondary | ICD-10-CM | POA: Insufficient documentation

## 2013-05-01 DIAGNOSIS — I10 Essential (primary) hypertension: Secondary | ICD-10-CM | POA: Insufficient documentation

## 2013-05-01 DIAGNOSIS — Z3202 Encounter for pregnancy test, result negative: Secondary | ICD-10-CM | POA: Insufficient documentation

## 2013-05-01 DIAGNOSIS — IMO0002 Reserved for concepts with insufficient information to code with codable children: Secondary | ICD-10-CM | POA: Insufficient documentation

## 2013-05-01 DIAGNOSIS — Y9389 Activity, other specified: Secondary | ICD-10-CM | POA: Insufficient documentation

## 2013-05-01 DIAGNOSIS — W11XXXA Fall on and from ladder, initial encounter: Secondary | ICD-10-CM | POA: Insufficient documentation

## 2013-05-01 DIAGNOSIS — F17200 Nicotine dependence, unspecified, uncomplicated: Secondary | ICD-10-CM | POA: Insufficient documentation

## 2013-05-01 DIAGNOSIS — S0993XA Unspecified injury of face, initial encounter: Secondary | ICD-10-CM | POA: Insufficient documentation

## 2013-05-01 DIAGNOSIS — Y92009 Unspecified place in unspecified non-institutional (private) residence as the place of occurrence of the external cause: Secondary | ICD-10-CM | POA: Insufficient documentation

## 2013-05-01 DIAGNOSIS — T07XXXA Unspecified multiple injuries, initial encounter: Secondary | ICD-10-CM | POA: Insufficient documentation

## 2013-05-01 DIAGNOSIS — W19XXXA Unspecified fall, initial encounter: Secondary | ICD-10-CM

## 2013-05-01 LAB — BASIC METABOLIC PANEL
BUN: 11 mg/dL (ref 6–23)
CHLORIDE: 100 meq/L (ref 96–112)
CO2: 26 mEq/L (ref 19–32)
CREATININE: 0.6 mg/dL (ref 0.50–1.10)
Calcium: 9.9 mg/dL (ref 8.4–10.5)
GFR calc Af Amer: >90 mL/min (ref >90)
GFR calc non Af Amer: >90 mL/min (ref >90)
Glucose, Bld: 93 mg/dL (ref 70–99)
Potassium: 3.8 mEq/L (ref 3.7–5.3)
Sodium: 139 mEq/L (ref 137–147)

## 2013-05-01 LAB — URINE MICROSCOPIC-ADD ON

## 2013-05-01 LAB — URINALYSIS, ROUTINE W REFLEX MICROSCOPIC
Bilirubin Urine: NEGATIVE
GLUCOSE, UA: NEGATIVE mg/dL
KETONES UR: NEGATIVE mg/dL
Leukocytes, UA: NEGATIVE
Nitrite: NEGATIVE
PROTEIN: NEGATIVE mg/dL
Specific Gravity, Urine: 1.025 (ref 1.005–1.030)
Urobilinogen, UA: 0.2 mg/dL (ref 0.0–1.0)
pH: 5.5 (ref 5.0–8.0)

## 2013-05-01 LAB — PREGNANCY, URINE: PREG TEST UR: NEGATIVE

## 2013-05-01 MED ORDER — ONDANSETRON HCL 4 MG/2ML IJ SOLN
4.0000 mg | Freq: Once | INTRAMUSCULAR | Status: AC
  Administered 2013-05-01: 4 mg via INTRAVENOUS
  Filled 2013-05-01: qty 2

## 2013-05-01 MED ORDER — MORPHINE SULFATE 4 MG/ML IJ SOLN
4.0000 mg | Freq: Once | INTRAMUSCULAR | Status: AC
  Administered 2013-05-01: 4 mg via INTRAVENOUS
  Filled 2013-05-01: qty 1

## 2013-05-01 MED ORDER — HYDROCODONE-ACETAMINOPHEN 5-325 MG PO TABS: 2.0000 | ORAL_TABLET | ORAL | Status: DC | PRN

## 2013-05-01 MED ORDER — IOHEXOL 300 MG/ML  SOLN
100.0000 mL | Freq: Once | INTRAMUSCULAR | Status: AC | PRN
  Administered 2013-05-01: 100 mL via INTRAVENOUS

## 2013-05-01 MED ORDER — IBUPROFEN 800 MG PO TABS: 800.0000 mg | ORAL_TABLET | Freq: Three times a day (TID) | ORAL | Status: DC

## 2013-05-01 NOTE — ED Notes (Signed)
Fell from ladder app 12 ft, trying to remove tree that fell on her house.  Alert, talking.  Pain low back, lt arm, lt leg.  ?LOC

## 2013-05-01 NOTE — ED Provider Notes (Signed)
CSN: [id number]     Arrival date & time 05/01/13  1647 History   First MD Initiated Contact with Patient 05/01/13 1954     Chief Complaint  Patient presents with  . Fall     (Consider location/radiation/quality/duration/timing/severity/associated sxs/prior Treatment) HPI Comments:  Patient states she fell from the top of a 10 foot ladder was trying to remove a tree from her house. She landed on her butt and has pain in her low back, left hand, left ribs and left foot. Denies losing consciousness but states she was stunned. Uncertain if she hit her head. She is not on any anticoagulation. She denies any previous history of back pain thought it is listed on her chart. No focal weakness, numbness or tingling. No bowel or bladder incontinence. She complains of pain in her left ribs on that she try to smoke a cigarette. Denies any chest pain or abdominal pain.  The history is provided by the patient.    Past Medical History  Diagnosis Date  . Rheumatoid arthritis(714.0)   . Chronic anemia   . Hypertension   . Arthritis, rheumatoid   . Incidental lung nodule   . Depression   . Chronic pain   . Chronic back pain    Past Surgical History  Procedure Laterality Date  . Tubal ligation    . Endometrial ablation    . Breast cyst excision  12/31/2010    Procedure: CYST EXCISION BREAST;  Surgeon: Fabio BeringBrent C Ziegler, MD;  Location: [facility];  Service: General;  Laterality: Right;  Excision Sebaceous Cyst Right Breast   Family History  Problem Relation Age of Onset  . Anesthesia problems Neg Hx   . Cancer Father    History  Substance Use Topics  . Smoking status: Current Every Day Smoker -- 0.03 packs/day for 30 years    Types: Cigarettes  . Smokeless tobacco: Never Used  . Alcohol Use: No     Comment: occassional   OB History   Grav Para Term Preterm Abortions TAB SAB Ect Mult Living   6 5 5  1  1   5      Review of Systems  Constitutional: Negative for fever, activity change and  appetite change.  HENT: Negative for congestion and rhinorrhea.   Respiratory: Negative for cough, chest tightness and shortness of breath.   Cardiovascular: Negative for chest pain.  Gastrointestinal: Negative for nausea, vomiting and abdominal pain.  Genitourinary: Negative for dysuria, hematuria, vaginal bleeding and vaginal discharge.  Musculoskeletal: Positive for arthralgias, back pain, myalgias and neck pain.  Skin: Negative for rash.  Neurological: Negative for dizziness, weakness and headaches.  A complete 10 system review of systems was obtained and all systems are negative except as noted in the HPI and PMH.      Allergies  Codeine  Home Medications   Prior to Admission medications   Medication Sig Start Date End Date Taking? Authorizing Provider  acetaminophen (TYLENOL) 325 MG tablet Take 650 mg by mouth every 6 (six) hours as needed.   Yes Historical Provider, MD  Calcium Carbonate-Vitamin D (CALCIUM 600 + D PO) Take 1 tablet by mouth every morning.    Yes Historical Provider, MD  ferrous sulfate 325 (65 FE) MG tablet Take 325 mg by mouth 2 (two) times daily. For anemia    Yes Historical Provider, MD  folic acid (FOLVITE) 1 MG tablet Take 1 mg by mouth daily.     Yes Historical Provider, MD  hydroxychloroquine (PLAQUENIL) 200  MG tablet Take 400 mg by mouth 2 (two) times daily.   Yes Historical Provider, MD  methotrexate (RHEUMATREX) 2.5 MG tablet Take 12.5 mg by mouth 2 (two) times a week. THURSDAYS AND FRIDAYSCaution:Chemotherapy. Protect from light.   Yes Historical Provider, MD  Multiple Vitamin (MULTIVITAMIN WITH MINERALS) TABS Take 1 tablet by mouth daily.   Yes Historical Provider, MD  predniSONE (DELTASONE) 20 MG tablet Take 20 mg by mouth 2 (two) times daily.   Yes Historical Provider, MD   BP 127/85  Pulse 83  Temp(Src) 98.3 F (36.8 C) (Oral)  Resp 18  Ht 5\' 6"  (1.676 m)  Wt 142 lb 4 oz (64.524 kg)  BMI 22.97 kg/m2  SpO2 100% Physical Exam   Constitutional: She is oriented to person, place, and time. She appears well-developed and well-nourished. No distress.  HENT:  Head: Normocephalic and atraumatic.  Mouth/Throat: Oropharynx is clear and moist. No oropharyngeal exudate.  Eyes: Conjunctivae and EOM are normal. Pupils are equal, round, and reactive to light.  Neck: Normal range of motion. Neck supple.  Paraspinal C-spine tenderness  Cardiovascular: Normal rate, regular rhythm, normal heart sounds and intact distal pulses.   No murmur heard. Pulmonary/Chest: Effort normal and breath sounds normal. No respiratory distress. She exhibits tenderness.  Tenderness to left anterior lateral ribs, no ecchymosis or crepitance  Abdominal: Soft. There is no tenderness. There is no rebound and no guarding.  Musculoskeletal: Normal range of motion. She exhibits tenderness. She exhibits no edema.  Tenderness to lumbar spine and sacrum in the midline. No step-offs. 5/5 strength in bilateral lower extremities. Ankle plantar and dorsiflexion intact. Great toe extension intact bilaterally. +2 DP and PT pulses. +2 patellar reflexes bilaterally. Normal gait. Pain with range of motion of left hip   Neurological: She is alert and oriented to person, place, and time. No cranial nerve deficit. She exhibits normal muscle tone. Coordination normal.    ED Course  Procedures (including critical care time) Labs Review Labs Reviewed  URINALYSIS, ROUTINE W REFLEX MICROSCOPIC - Abnormal; Notable for the following:    Hgb urine dipstick MODERATE (*)    All other components within normal limits  PREGNANCY, URINE  BASIC METABOLIC PANEL  URINE MICROSCOPIC-ADD ON    Imaging Review Ct Head Wo Contrast  05/01/2013   CLINICAL DATA:  Fall from ladder  EXAM: CT HEAD WITHOUT CONTRAST  CT CERVICAL SPINE WITHOUT CONTRAST  TECHNIQUE: Multidetector CT imaging of the head and cervical spine was performed following the standard protocol without intravenous contrast.  Multiplanar CT image reconstructions of the cervical spine were also generated.  COMPARISON:  CT CHEST W/CM dated 05/01/2013; CT HEAD W/O CM dated 01/24/2012  FINDINGS: CT HEAD FINDINGS  No mass effect, midline shift, or acute hemorrhage. Brain parenchyma and ventricular system are unremarkable. Mastoid air cells are clear. Intact cranium  CT CERVICAL SPINE FINDINGS  No acute fracture.  No dislocation.  Lung apices clear. Normal thyroid gland. No obvious soft tissue injury. No obvious spinal hematoma. Mild degenerative changes. Broad-based C5-C6 disc herniation causes spinal stenosis.  IMPRESSION: No acute intracranial pathology.  No acute cervical spine injury.  C5-C6 disc herniation with spinal stenosis.   Electronically Signed   By: 03/23/2012 M.D.   On: 05/01/2013 22:14   Ct Chest W Contrast  05/01/2013   CLINICAL DATA:  Fall. Headache, neck pain, chest pain, buttock pain.  EXAM: CT CHEST, ABDOMEN, AND PELVIS WITH CONTRAST  TECHNIQUE: Multidetector CT imaging of the chest, abdomen and  pelvis was performed following the standard protocol during bolus administration of intravenous contrast.  CONTRAST:  OMNIPAQUE IOHEXOL 300 MG/ML  SOLN  COMPARISON:  DG SACRUM/COCCYX dated 07/02/2012; CT ABD - PELV W/ CM dated 01/27/2012  FINDINGS: CT CHEST FINDINGS  The heart and pericardium. Are nonsuspicious, normal size with mild Coronary artery calcifications. Thoracic aorta is normal in course and caliber, unremarkable.  No pleural effusions or focal consolidations. 4 mm ground-glass nodule in the left upper lobe is below size surveillance for recommendations. Stable 6 mm right lower lobe pulmonary nodule. Tracheobronchial tree is patent and midline. No pneumothorax.  No lymphadenopathy by CT size criteria. Thoracic esophagus is unremarkable. Included soft tissues and osseous structures are nonsuspicious. There is  CT ABDOMEN AND PELVIS FINDINGS  The liver, spleen, gallbladder, pancreas and adrenal glands are  unremarkable.  The stomach, small and large bowel are normal in course and caliber without inflammatory changes. The appendix is not discretely identified, however there are no inflammatory changes in the right lower quadrant. No intraperitoneal free fluid nor free air.  Kidneys are orthotopic, demonstrating symmetric enhancement without nephrolithiasis, hydronephrosis or solid renal masses. Too small to characterize hypodensities in the in the cortical aspect of the kidneys bilaterally. The unopacified ureters are normal in course and caliber. Delayed imaging through the kidneys demonstrates symmetric prompt excretion to the proximal urinary collecting system. Urinary bladder is predominantly decompressed and unremarkable.  Aortoiliac vessels are normal in course and caliber with mild calcific atherosclerosis. No lymphadenopathy by CT size criteria. Heterogeneously enhancing uterus could reflect leiomyomata. Calcified granuloma in left abdomen. Phleboliths in the pelvis. The soft tissues and included osseous structures are nonsuspicious. Severe L4-5 and moderate-to-severe L5-S1 degenerative disc disease resulting in moderate left L5-S1 neural foraminal narrowing.  IMPRESSION: CT chest: No acute cardiopulmonary process or CT findings of thoracic trauma.  Stable 7 mm right lower lobe pulmonary nodule  CT abdomen and pelvis: No acute intra-abdominal or pelvic process; no CT findings of abdominal/pelvic trauma.   Electronically Signed   By: Awilda Metro   On: 05/01/2013 22:19   Ct Cervical Spine Wo Contrast  05/01/2013   CLINICAL DATA:  Fall from ladder  EXAM: CT HEAD WITHOUT CONTRAST  CT CERVICAL SPINE WITHOUT CONTRAST  TECHNIQUE: Multidetector CT imaging of the head and cervical spine was performed following the standard protocol without intravenous contrast. Multiplanar CT image reconstructions of the cervical spine were also generated.  COMPARISON:  CT CHEST W/CM dated 05/01/2013; CT HEAD W/O CM dated  01/24/2012  FINDINGS: CT HEAD FINDINGS  No mass effect, midline shift, or acute hemorrhage. Brain parenchyma and ventricular system are unremarkable. Mastoid air cells are clear. Intact cranium  CT CERVICAL SPINE FINDINGS  No acute fracture.  No dislocation.  Lung apices clear. Normal thyroid gland. No obvious soft tissue injury. No obvious spinal hematoma. Mild degenerative changes. Broad-based C5-C6 disc herniation causes spinal stenosis.  IMPRESSION: No acute intracranial pathology.  No acute cervical spine injury.  C5-C6 disc herniation with spinal stenosis.   Electronically Signed   By: Maryclare Bean M.D.   On: 05/01/2013 22:14   Ct Abdomen Pelvis W Contrast  05/01/2013   CLINICAL DATA:  Fall. Headache, neck pain, chest pain, buttock pain.  EXAM: CT CHEST, ABDOMEN, AND PELVIS WITH CONTRAST  TECHNIQUE: Multidetector CT imaging of the chest, abdomen and pelvis was performed following the standard protocol during bolus administration of intravenous contrast.  CONTRAST:  OMNIPAQUE IOHEXOL 300 MG/ML  SOLN  COMPARISON:  DG SACRUM/COCCYX dated 07/02/2012; CT ABD - PELV W/ CM dated 01/27/2012  FINDINGS: CT CHEST FINDINGS  The heart and pericardium. Are nonsuspicious, normal size with mild Coronary artery calcifications. Thoracic aorta is normal in course and caliber, unremarkable.  No pleural effusions or focal consolidations. 4 mm ground-glass nodule in the left upper lobe is below size surveillance for recommendations. Stable 6 mm right lower lobe pulmonary nodule. Tracheobronchial tree is patent and midline. No pneumothorax.  No lymphadenopathy by CT size criteria. Thoracic esophagus is unremarkable. Included soft tissues and osseous structures are nonsuspicious. There is  CT ABDOMEN AND PELVIS FINDINGS  The liver, spleen, gallbladder, pancreas and adrenal glands are unremarkable.  The stomach, small and large bowel are normal in course and caliber without inflammatory changes. The appendix is not discretely  identified, however there are no inflammatory changes in the right lower quadrant. No intraperitoneal free fluid nor free air.  Kidneys are orthotopic, demonstrating symmetric enhancement without nephrolithiasis, hydronephrosis or solid renal masses. Too small to characterize hypodensities in the in the cortical aspect of the kidneys bilaterally. The unopacified ureters are normal in course and caliber. Delayed imaging through the kidneys demonstrates symmetric prompt excretion to the proximal urinary collecting system. Urinary bladder is predominantly decompressed and unremarkable.  Aortoiliac vessels are normal in course and caliber with mild calcific atherosclerosis. No lymphadenopathy by CT size criteria. Heterogeneously enhancing uterus could reflect leiomyomata. Calcified granuloma in left abdomen. Phleboliths in the pelvis. The soft tissues and included osseous structures are nonsuspicious. Severe L4-5 and moderate-to-severe L5-S1 degenerative disc disease resulting in moderate left L5-S1 neural foraminal narrowing.  IMPRESSION: CT chest: No acute cardiopulmonary process or CT findings of thoracic trauma.  Stable 7 mm right lower lobe pulmonary nodule  CT abdomen and pelvis: No acute intra-abdominal or pelvic process; no CT findings of abdominal/pelvic trauma.   Electronically Signed   By: Awilda Metro   On: 05/01/2013 22:19   Dg Hand Complete Left  05/01/2013   CLINICAL DATA:  Fall with left hand injury and pain.  EXAM: LEFT HAND - COMPLETE 3+ VIEW  COMPARISON:  None.  FINDINGS: There is no evidence of acute fracture, subluxation or dislocation.  Probable erosive changes primarily along the PIP joints noted.  No other focal bony abnormalities are noted.  IMPRESSION: No evidence of acute bony abnormalities.  Probable erosive changes along the PIP joints likely representing inflammatory arthritis such as rheumatoid.   Electronically Signed   By: Laveda Abbe M.D.   On: 05/01/2013 22:17   Dg Foot Complete  Left  05/01/2013   CLINICAL DATA:  Patient fell off a ladder.  Pain in the left foot.  EXAM: LEFT FOOT - COMPLETE 3+ VIEW  COMPARISON:  None.  FINDINGS: Degenerative cyst demonstrated in the fourth metatarsal head and in the first proximal phalanx. No evidence of acute fracture or dislocation. No focal bone lesion or bone destruction. Soft tissues are unremarkable.  IMPRESSION: Degenerative changes.  No acute fractures.   Electronically Signed   By: Burman Nieves M.D.   On: 05/01/2013 22:22     EKG Interpretation None      MDM   Final diagnoses:  Fall  Back pain  Multiple contusions   Fall from 10 feet pain to ribs, low back, left hand and left foot. Vital stable. No distress. GCS 15. ABCs intact.  No neuro deficits.  CT head and C spine negative.  Uncertain LOC. Imaging negative for acute traumatic pathology. Patient able to ambulate.  Tolerating PO.  Speaking on phone, no distress.  Supportive care for multiple contusions.  Return precautions discussed.  BP 127/85  Pulse 83  Temp(Src) 98.3 F (36.8 C) (Oral)  Resp 18  Ht 5\' 6"  (1.676 m)  Wt 142 lb 4 oz (64.524 kg)  BMI 22.97 kg/m2  SpO2 100%       , MD 05/01/13 2333

## 2013-05-01 NOTE — Discharge Instructions (Signed)
Back Pain, Adult Low back pain is very common. About 1 in 5 people have back pain.The cause of low back pain is rarely dangerous. The pain often gets better over time.About half of people with a sudden onset of back pain feel better in just 2 weeks. About 8 in 10 people feel better by 6 weeks.  CAUSES Some common causes of back pain include:  Strain of the muscles or ligaments supporting the spine.  Wear and tear (degeneration) of the spinal discs.  Arthritis.  Direct injury to the back. DIAGNOSIS Most of the time, the direct cause of low back pain is not known.However, back pain can be treated effectively even when the exact cause of the pain is unknown.Answering your caregiver's questions about your overall health and symptoms is one of the most accurate ways to make sure the cause of your pain is not dangerous. If your caregiver needs more information, he or she may order lab work or imaging tests (X-rays or MRIs).However, even if imaging tests show changes in your back, this usually does not require surgery. HOME CARE INSTRUCTIONS For many people, back pain returns.Since low back pain is rarely dangerous, it is often a condition that people can learn to manageon their own.   Remain active. It is stressful on the back to sit or stand in one place. Do not sit, drive, or stand in one place for more than 30 minutes at a time. Take short walks on level surfaces as soon as pain allows.Try to increase the length of time you walk each day.  Do not stay in bed.Resting more than 1 or 2 days can delay your recovery.  Do not avoid exercise or work.Your body is made to move.It is not dangerous to be active, even though your back may hurt.Your back will likely heal faster if you return to being active before your pain is gone.  Pay attention to your body when you bend and lift. Many people have less discomfortwhen lifting if they bend their knees, keep the load close to their bodies,and  avoid twisting. Often, the most comfortable positions are those that put less stress on your recovering back.  Find a comfortable position to sleep. Use a firm mattress and lie on your side with your knees slightly bent. If you lie on your back, put a pillow under your knees.  Only take over-the-counter or prescription medicines as directed by your caregiver. Over-the-counter medicines to reduce pain and inflammation are often the most helpful.Your caregiver may prescribe muscle relaxant drugs.These medicines help dull your pain so you can more quickly return to your normal activities and healthy exercise.  Put ice on the injured area.  Put ice in a plastic bag.  Place a towel between your skin and the bag.  Leave the ice on for 15-20 minutes, 03-04 times a day for the first 2 to 3 days. After that, ice and heat may be alternated to reduce pain and spasms.  Ask your caregiver about trying back exercises and gentle massage. This may be of some benefit.  Avoid feeling anxious or stressed.Stress increases muscle tension and can worsen back pain.It is important to recognize when you are anxious or stressed and learn ways to manage it.Exercise is a great option. SEEK MEDICAL CARE IF:  You have pain that is not relieved with rest or medicine.  You have pain that does not improve in 1 week.  You have new symptoms.  You are generally not feeling well. SEEK   IMMEDIATE MEDICAL CARE IF:   You have pain that radiates from your back into your legs.  You develop new bowel or bladder control problems.  You have unusual weakness or numbness in your arms or legs.  You develop nausea or vomiting.  You develop abdominal pain.  You feel faint. Document Released: 12/29/2004 Document Revised: 06/30/2011 Document Reviewed: 05/19/2010 [employer] Patient Information 2014 [employer].  

## 2013-05-21 IMAGING — CT CT CHEST W/ CM
2 of 4 series · 13 of 36 positions shown, 16 images · IV contrast (Omnipaque 300)
Comparison: None.

CT CHEST

CLINICAL DATA: Trauma.

CT CHEST, ABDOMEN AND PELVIS WITH CONTRAST
TECHNIQUE: Multidetector CT imaging of the chest, abdomen and
pelvis was performed following the standard protocol during bolus
administration of intravenous contrast.
Contrast: 100mL OMNIPAQUE IOHEXOL 300 MG/ML  SOLN

[Series 2: cap with 5.0 b40f · axial · 0.68mm/px · z∈[-581,-41]mm · 10 of 128 slices shown, 13 images]
[im 13/128  mediastinal]
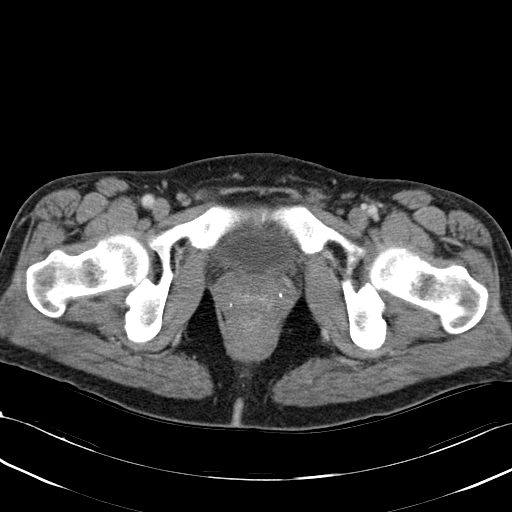
[im 13/128  lung]
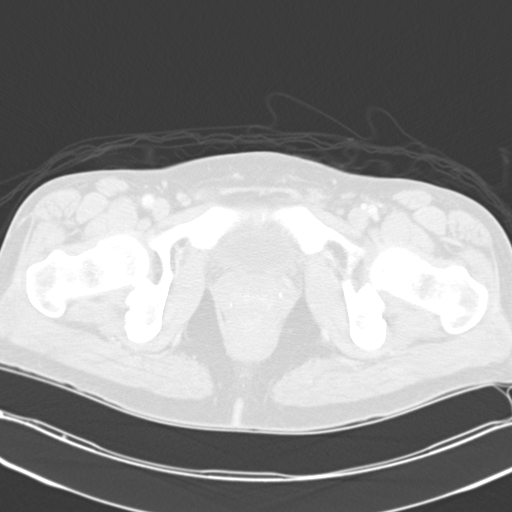
[im 25/128  lung]
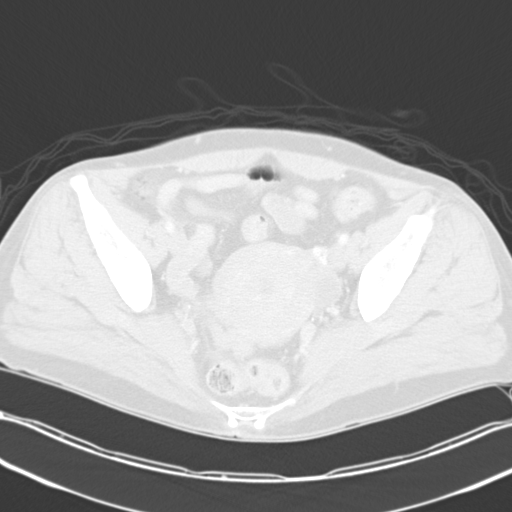
[im 37/128  lung]
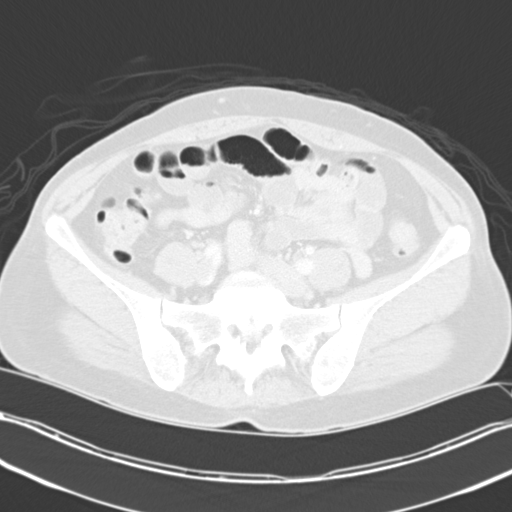
[im 49/128  lung]
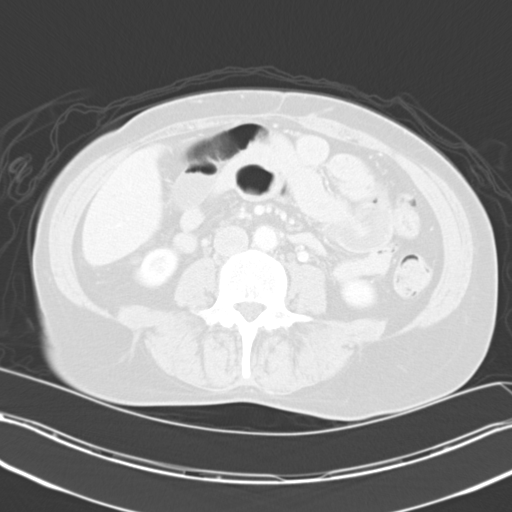
[im 61/128  mediastinal]
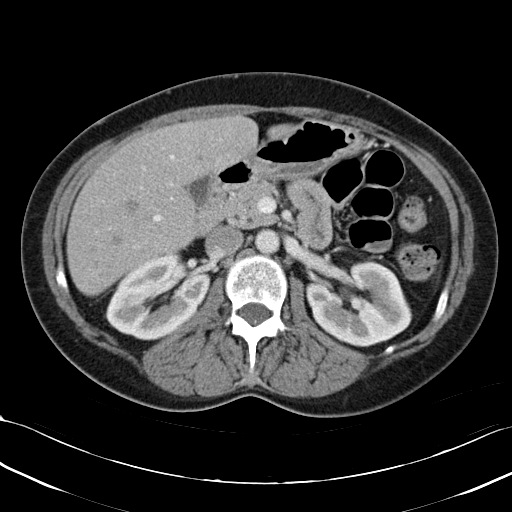
[im 61/128  lung]
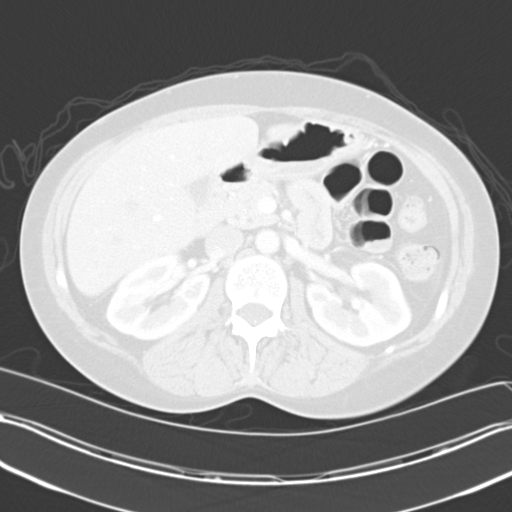
[im 73/128  lung]
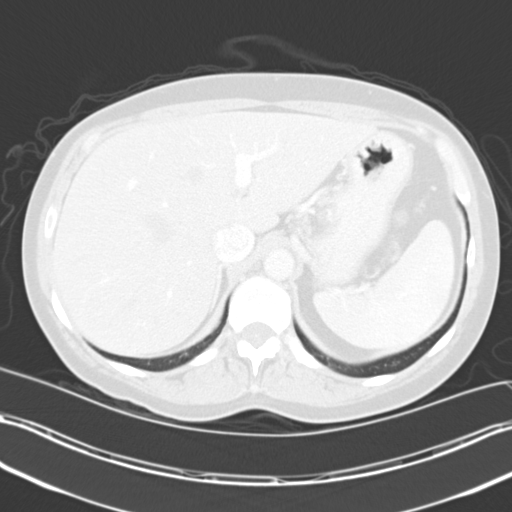
[im 85/128  lung]
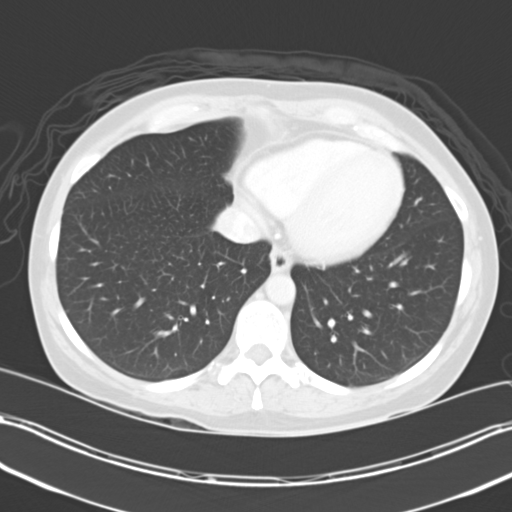
[im 97/128  lung]
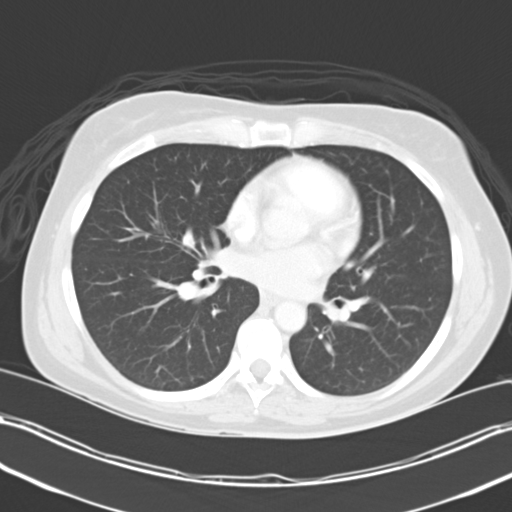
[im 109/128  mediastinal]
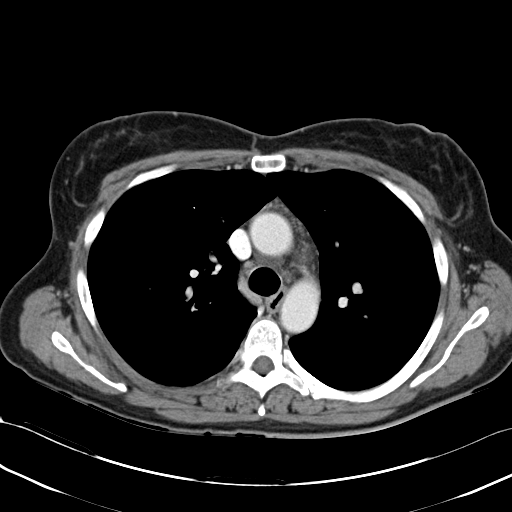
[im 109/128  lung]
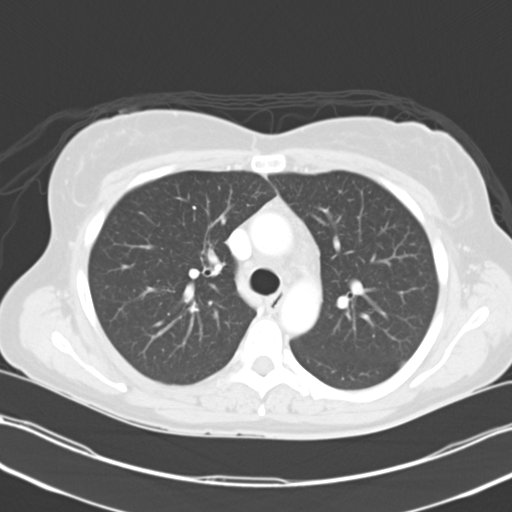
[im 121/128  lung]
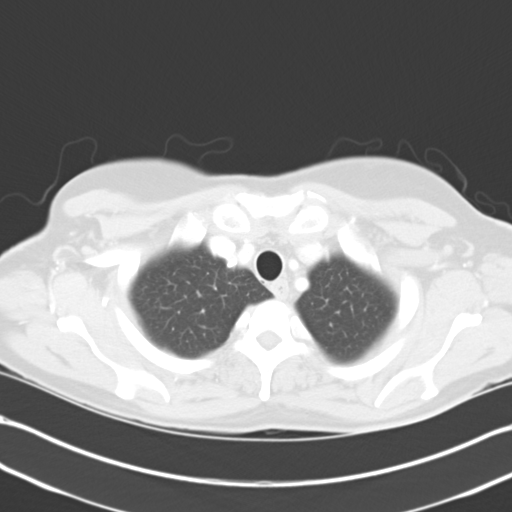

[Series 4: mpr cor post contrast (id) · coronal · 0.73mm/px · 3 of 79 slices shown]
[im 16/79  lung]
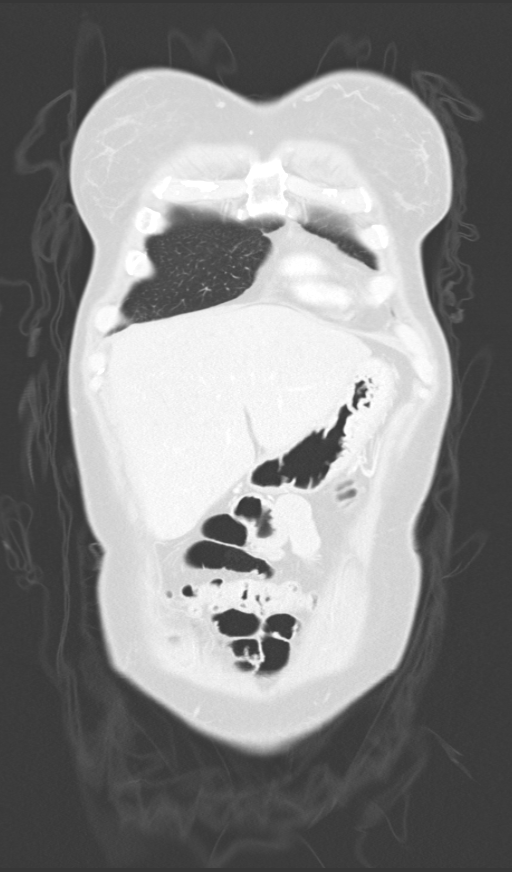
[im 32/79  lung]
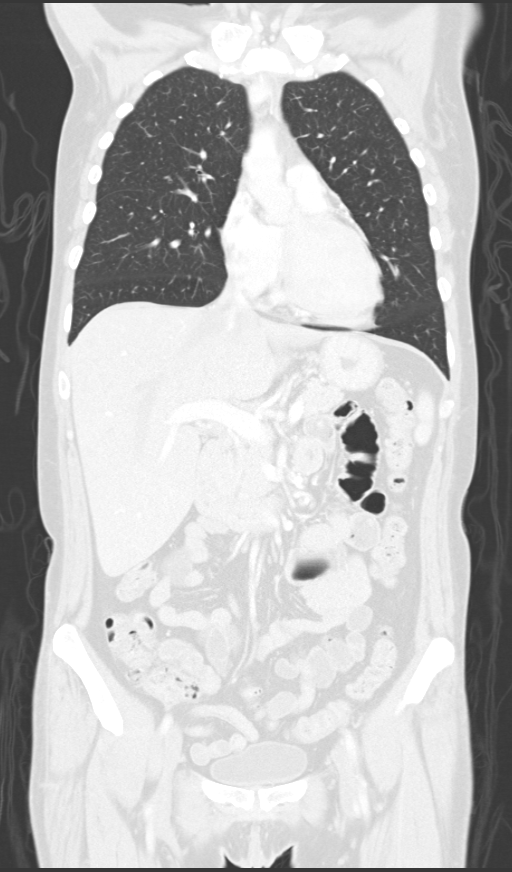
[im 47/79  lung]
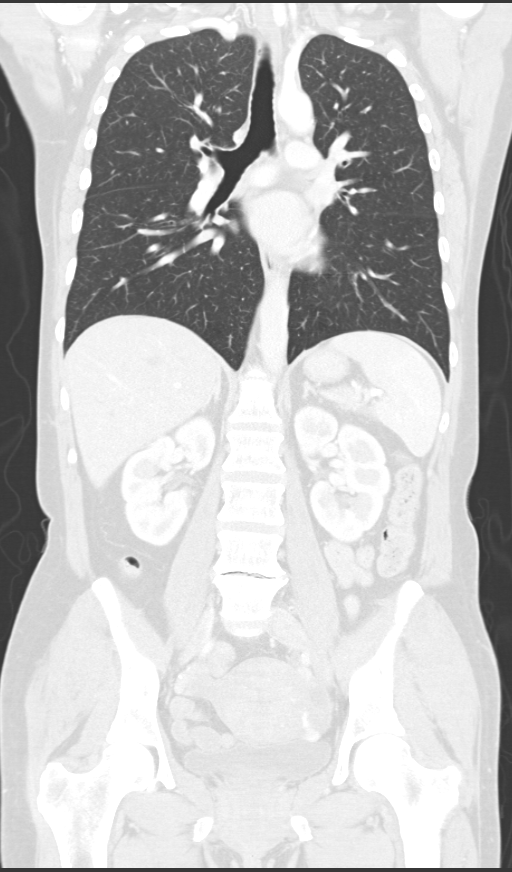

[13 of 36 positions shown; findings below may reference images not displayed]

FINDINGS: There is no supraclavicular or axillary adenopathy.

There is no mediastinal or hilar adenopathy.

No pericardial or pleural effusion.

The tracheobronchial tree appears normal.

Calcifications within the LAD coronary artery noted.

No evidence for pulmonary contusion or pneumothorax identified.

Pulmonary nodule in the right middle lobe is identified measuring 5
mm, image number 38.
IMPRESSION: 1.  No acute cardiopulmonary abnormalities.
2.  Right middle lobe pulmonary nodule measures 5 mm. If the
patient is at high risk for bronchogenic carcinoma, follow-up chest
CT at 6-12 months is recommended.  If the patient is at low risk
for bronchogenic carcinoma, follow-up chest CT at 12 months is
recommended.  This recommendation follows the consensus statement:
Guidelines for Management of Small Pulmonary Nodules Detected on CT
Scans: A Statement from the [HOSPITAL] as published in

CT ABDOMEN AND PELVIS
FINDINGS: There are no focal liver abnormalities.

The spleen is normal.

The adrenal glands are normal.

Normal appearance of the pancreas.

The gallbladder is normal.

No biliary dilatation.

Normal appearance of the right kidney.

The left kidney is also normal.

No upper abdominal adenopathy.

There is no pelvic or inguinal adenopathy.

Trace amount of low density fluid is noted within the dependent
portion of the pelvis.  The uterus and adnexal structures have a
normal physiologic appearance for patient's age.

The urinary bladder appears normal.

The bowel loops within the abdomen and pelvis appear normal.

Review of the visualized osseous structures shows no acute bony
abnormalities.
IMPRESSION: 1.  No acute findings.

## 2013-05-23 ENCOUNTER — Encounter (HOSPITAL_COMMUNITY): Payer: Self-pay | Admitting: Emergency Medicine

## 2013-05-23 ENCOUNTER — Emergency Department (HOSPITAL_COMMUNITY)
Admission: EM | Admit: 2013-05-23 | Discharge: 2013-05-23 | Disposition: A | Discharge status: Home / Self Care | Payer: Self-pay | Attending: Emergency Medicine | Admitting: Emergency Medicine

## 2013-05-23 DIAGNOSIS — G8929 Other chronic pain: Secondary | ICD-10-CM | POA: Insufficient documentation

## 2013-05-23 DIAGNOSIS — M1990 Unspecified osteoarthritis, unspecified site: Secondary | ICD-10-CM | POA: Insufficient documentation

## 2013-05-23 DIAGNOSIS — Z79899 Other long term (current) drug therapy: Secondary | ICD-10-CM | POA: Insufficient documentation

## 2013-05-23 DIAGNOSIS — M549 Dorsalgia, unspecified: Secondary | ICD-10-CM | POA: Insufficient documentation

## 2013-05-23 DIAGNOSIS — D649 Anemia, unspecified: Secondary | ICD-10-CM | POA: Insufficient documentation

## 2013-05-23 DIAGNOSIS — F17200 Nicotine dependence, unspecified, uncomplicated: Secondary | ICD-10-CM | POA: Insufficient documentation

## 2013-05-23 DIAGNOSIS — I10 Essential (primary) hypertension: Secondary | ICD-10-CM | POA: Insufficient documentation

## 2013-05-23 DIAGNOSIS — M069 Rheumatoid arthritis, unspecified: Secondary | ICD-10-CM | POA: Insufficient documentation

## 2013-05-23 DIAGNOSIS — IMO0002 Reserved for concepts with insufficient information to code with codable children: Secondary | ICD-10-CM | POA: Insufficient documentation

## 2013-05-23 DIAGNOSIS — Z8659 Personal history of other mental and behavioral disorders: Secondary | ICD-10-CM | POA: Insufficient documentation

## 2013-05-23 MED ORDER — KETOROLAC TROMETHAMINE 60 MG/2ML IM SOLN
60.0000 mg | Freq: Once | INTRAMUSCULAR | Status: AC
  Administered 2013-05-23: 60 mg via INTRAMUSCULAR
  Filled 2013-05-23: qty 2

## 2013-05-23 MED ORDER — HYDROCODONE-ACETAMINOPHEN 5-325 MG PO TABS
1.0000 | ORAL_TABLET | Freq: Once | ORAL | Status: AC
  Administered 2013-05-23: 1 via ORAL
  Filled 2013-05-23: qty 1

## 2013-05-23 MED ORDER — ONDANSETRON HCL 4 MG PO TABS
4.0000 mg | ORAL_TABLET | Freq: Once | ORAL | Status: AC
  Administered 2013-05-23: 4 mg via ORAL
  Filled 2013-05-23: qty 1

## 2013-05-23 MED ORDER — HYDROCODONE-ACETAMINOPHEN 5-325 MG PO TABS: 1.0000 | ORAL_TABLET | ORAL | Status: DC | PRN

## 2013-05-23 NOTE — ED Notes (Signed)
Pt c/o pain in both feet, hands, and elbows since late last night. Pt reports hx of arthritis. Pt states she is currently taking prednisone but denies relief.

## 2013-05-23 NOTE — ED Provider Notes (Signed)
CSN: [id number]     Arrival date & time 05/23/13  1821 History   First MD Initiated Contact with Patient 05/23/13 2109     Chief Complaint  Patient presents with  . Joint Pain     (Consider location/radiation/quality/duration/timing/severity/associated sxs/prior Treatment) HPI Comments: Pt has hx of rheumatoid arthritis. She has been working extra hours and doing more lifting, pushing, and pulling. She c/o pain to the feet, hands, back, and elbows. She denies fever. No reported injury. She has tried her arthritis meds, but pain is increasing. She is scheduled to see a joint specialist soon, but states the pain is getting more difficult.  The history is provided by the patient.    Past Medical History  Diagnosis Date  . Rheumatoid arthritis(714.0)   . Chronic anemia   . Hypertension   . Arthritis, rheumatoid   . Incidental lung nodule   . Depression   . Chronic pain   . Chronic back pain    Past Surgical History  Procedure Laterality Date  . Tubal ligation    . Endometrial ablation    . Breast cyst excision  12/31/2010    Procedure: CYST EXCISION BREAST;  Surgeon: Fabio Bering, MD;  Location: [facility];  Service: General;  Laterality: Right;  Excision Sebaceous Cyst Right Breast   Family History  Problem Relation Age of Onset  . Anesthesia problems Neg Hx   . Cancer Father    History  Substance Use Topics  . Smoking status: Current Every Day Smoker -- 0.03 packs/day for 30 years    Types: Cigarettes  . Smokeless tobacco: Never Used  . Alcohol Use: No     Comment: occassional   OB History   Grav Para Term Preterm Abortions TAB SAB Ect Mult Living   6 5 5  1  1   5      Review of Systems  Constitutional: Negative for fever and activity change.       All ROS Neg except as noted in HPI  HENT: Negative for nosebleeds.   Eyes: Negative for photophobia and discharge.  Respiratory: Negative for cough, shortness of breath and wheezing.   Cardiovascular: Negative for  chest pain and palpitations.  Gastrointestinal: Negative for abdominal pain and blood in stool.  Genitourinary: Negative for dysuria, frequency and hematuria.  Musculoskeletal: Positive for arthralgias and back pain. Negative for neck pain.  Skin: Negative.  Negative for rash.  Neurological: Negative for dizziness, seizures and speech difficulty.  Psychiatric/Behavioral: Negative for hallucinations and confusion.      Allergies  Codeine  Home Medications   Prior to Admission medications   Medication Sig Start Date End Date Taking? Authorizing Provider  acetaminophen (TYLENOL) 325 MG tablet Take 650 mg by mouth every 6 (six) hours as needed.   Yes Historical Provider, MD  Calcium Carbonate-Vitamin D (CALCIUM 600 + D PO) Take 1 tablet by mouth every morning.    Yes Historical Provider, MD  ferrous sulfate 325 (65 FE) MG tablet Take 325 mg by mouth 2 (two) times daily. For anemia    Yes Historical Provider, MD  folic acid (FOLVITE) 1 MG tablet Take 1 mg by mouth daily.     Yes Historical Provider, MD  hydroxychloroquine (PLAQUENIL) 200 MG tablet Take 400 mg by mouth 2 (two) times daily.   Yes Historical Provider, MD  methotrexate (RHEUMATREX) 2.5 MG tablet Take 12.5 mg by mouth 2 (two) times a week. THURSDAYS AND FRIDAYSCaution:Chemotherapy. Protect from light.   Yes Historical  Provider, MD  Multiple Vitamin (MULTIVITAMIN WITH MINERALS) TABS Take 1 tablet by mouth daily.   Yes Historical Provider, MD  predniSONE (DELTASONE) 10 MG tablet Take 10-60 mg by mouth See admin instructions. 6,5,4,3,2,1   Yes Historical Provider, MD  HYDROcodone-acetaminophen (NORCO/VICODIN) 5-325 MG per tablet Take 2 tablets by mouth every 4 (four) hours as needed. 05/01/13   Glynn Octave, MD   BP 140/90  Pulse 83  Temp(Src) 98.4 F (36.9 C) (Oral)  Resp 16  SpO2 100% Physical Exam  Nursing note and vitals reviewed. Constitutional: She is oriented to person, place, and time. She appears well-developed and  well-nourished.  Non-toxic appearance.  HENT:  Head: Normocephalic.  Right Ear: Tympanic membrane and external ear normal.  Left Ear: Tympanic membrane and external ear normal.  Eyes: EOM and lids are normal. Pupils are equal, round, and reactive to light.  Neck: Normal range of motion. Neck supple. Carotid bruit is not present.  Cardiovascular: Normal rate, regular rhythm, normal heart sounds, intact distal pulses and normal pulses.   Pulmonary/Chest: Breath sounds normal. No respiratory distress.  Abdominal: Soft. Bowel sounds are normal. There is no tenderness. There is no guarding.  Musculoskeletal: Normal range of motion.  And a you in degenerative changes of the hands, knees, and feet. No hot joints appreciated. Stiffness and pain with attempted range of motion of the joints.  Lymphadenopathy:       Head (right side): No submandibular adenopathy present.       Head (left side): No submandibular adenopathy present.    She has no cervical adenopathy.  Neurological: She is alert and oriented to person, place, and time. She has normal strength. No cranial nerve deficit or sensory deficit.  Skin: Skin is warm and dry.  Psychiatric: She has a normal mood and affect. Her speech is normal.    ED Course  Procedures (including critical care time) Labs Review Labs Reviewed - No data to display  Imaging Review No results found.   EKG Interpretation None      MDM Patient has history of degenerative joint disease and rheumatoid arthritis. She is currently on him medications for her rheumatoid arthritis, but states that she has been taking on increased hours at her job and also has been exposed to a lot of humidity due to the weather. She states that her pain is so severe she can't rest at night. She spoke with her physician put her on a prednisone taper, and told her that if her pain continued to come to the emergency department.  No evidence of septic joint appreciated. Suspect that the  patient is having an exacerbation of her rheumatoid arthritis. Patient treated with intramuscular Toradol in the emergency department. Patient advised to continue her prednisone taper. Norco 12 tablets given to the patient. Patient advised to see her primary physician for any additional pain management.    Final diagnoses:  None    *I have reviewed nursing notes, vital signs, and all appropriate lab and imaging results for this patient.Kathie Dike, PA-C 05/24/13 2000

## 2013-05-23 NOTE — ED Notes (Signed)
Pain in feet, ankles, elbows, No injury. Hx of arthritis.  Has appt at [facility]  In June to start new therapy.

## 2013-05-23 NOTE — Discharge Instructions (Signed)
Please continue your prednisone taper and your other arthritis related medicines. May add Norco for pain if needed. This medication may cause drowsiness, please use with caution. Please see Dr. Delbert Harness or your joint specialist for any additional pain management. Osteoarthritis Osteoarthritis is a disease that causes soreness and swelling (inflammation) of a joint. It occurs when the cartilage at the affected joint wears down. Cartilage acts as a cushion, covering the ends of bones where they meet to form a joint. Osteoarthritis is the most common form of arthritis. It often occurs in older people. The joints affected most often by this condition include those in the:  Ends of the fingers.  Thumbs.  Neck.  Lower back.  Knees.  Hips. CAUSES  Over time, the cartilage that covers the ends of bones begins to wear away. This causes bone to rub on bone, producing pain and stiffness in the affected joints.  RISK FACTORS Certain factors can increase your chances of having osteoarthritis, including:  Older age.  Excessive body weight.  Overuse of joints. SIGNS AND SYMPTOMS   Pain, swelling, and stiffness in the joint.  Over time, the joint may lose its normal shape.  Small deposits of bone (osteophytes) may grow on the edges of the joint.  Bits of bone or cartilage can break off and float inside the joint space. This may cause more pain and damage. DIAGNOSIS  Your health care provider will do a physical exam and ask about your symptoms. Various tests may be ordered, such as:  X-rays of the affected joint.  An MRI scan.  Blood tests to rule out other types of arthritis.  Joint fluid tests. This involves using a needle to draw fluid from the joint and examining the fluid under a microscope. TREATMENT  Goals of treatment are to control pain and improve joint function. Treatment plans may include:  A prescribed exercise program that allows for rest and joint relief.  A weight  control plan.  Pain relief techniques, such as:  Properly applied heat and cold.  Electric pulses delivered to nerve endings under the skin (transcutaneous electrical nerve stimulation, TENS).  Massage.  Certain nutritional supplements.  Medicines to control pain, such as:  Acetaminophen.  Nonsteroidal anti-inflammatory drugs (NSAIDs), such as naproxen.  Narcotic or central-acting agents, such as tramadol.  Corticosteroids. These can be given orally or as an injection.  Surgery to reposition the bones and relieve pain (osteotomy) or to remove loose pieces of bone and cartilage. Joint replacement may be needed in advanced states of osteoarthritis. HOME CARE INSTRUCTIONS   Only take over-the-counter or prescription medicines as directed by your health care provider. Take all medicines exactly as instructed.  Maintain a healthy weight. Follow your health care provider's instructions for weight control. This may include dietary instructions.  Exercise as directed. Your health care provider can recommend specific types of exercise. These may include:  Strengthening exercises These are done to strengthen the muscles that support joints affected by arthritis. They can be performed with weights or with exercise bands to add resistance.  Aerobic activities These are exercises, such as brisk walking or low-impact aerobics, that get your heart pumping.  Range-of-motion activities These keep your joints limber.  Balance and agility exercises These help you maintain daily living skills.  Rest your affected joints as directed by your health care provider.  Follow up with your health care provider as directed. SEEK MEDICAL CARE IF:   Your skin turns red.  You develop a rash  in addition to your joint pain.  You have worsening joint pain. SEEK IMMEDIATE MEDICAL CARE IF:  You have a significant loss of weight or appetite.  You have a fever along with joint or muscle aches.  You  have night sweats. FOR MORE INFORMATION  National Institute of Arthritis and Musculoskeletal and Skin Diseases: www.niams.http://www.myers.net/ [employer] on Aging: https://walker.com/ American College of Rheumatology: www.rheumatology.org Document Released: 12/29/2004 Document Revised: 10/19/2012 Document Reviewed: 09/05/2012 [facility] Patient Information 2014 [place], [place].

## 2013-05-30 NOTE — ED Provider Notes (Signed)
Medical screening examination/treatment/procedure(s) were performed by non-physician practitioner and as supervising physician I was immediately available for consultation/collaboration.   EKG Interpretation None      Devoria Albe, MD, Armando Gang   Ward Givens, MD 05/30/13 [phone]
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
## 2013-06-07 ENCOUNTER — Encounter (HOSPITAL_COMMUNITY): Payer: Self-pay | Admitting: Emergency Medicine

## 2013-06-07 ENCOUNTER — Emergency Department (HOSPITAL_COMMUNITY)
Admission: EM | Admit: 2013-06-07 | Discharge: 2013-06-08 | Disposition: A | Discharge status: Home / Self Care | Payer: Self-pay | Attending: Emergency Medicine | Admitting: Emergency Medicine

## 2013-06-07 DIAGNOSIS — M069 Rheumatoid arthritis, unspecified: Secondary | ICD-10-CM | POA: Insufficient documentation

## 2013-06-07 DIAGNOSIS — G8929 Other chronic pain: Secondary | ICD-10-CM | POA: Insufficient documentation

## 2013-06-07 DIAGNOSIS — D649 Anemia, unspecified: Secondary | ICD-10-CM | POA: Insufficient documentation

## 2013-06-07 DIAGNOSIS — R296 Repeated falls: Secondary | ICD-10-CM | POA: Insufficient documentation

## 2013-06-07 DIAGNOSIS — Y9389 Activity, other specified: Secondary | ICD-10-CM | POA: Insufficient documentation

## 2013-06-07 DIAGNOSIS — F17200 Nicotine dependence, unspecified, uncomplicated: Secondary | ICD-10-CM | POA: Insufficient documentation

## 2013-06-07 DIAGNOSIS — Z8659 Personal history of other mental and behavioral disorders: Secondary | ICD-10-CM | POA: Insufficient documentation

## 2013-06-07 DIAGNOSIS — R0602 Shortness of breath: Secondary | ICD-10-CM | POA: Insufficient documentation

## 2013-06-07 DIAGNOSIS — IMO0002 Reserved for concepts with insufficient information to code with codable children: Secondary | ICD-10-CM | POA: Insufficient documentation

## 2013-06-07 DIAGNOSIS — Y929 Unspecified place or not applicable: Secondary | ICD-10-CM | POA: Insufficient documentation

## 2013-06-07 DIAGNOSIS — Z8709 Personal history of other diseases of the respiratory system: Secondary | ICD-10-CM | POA: Insufficient documentation

## 2013-06-07 DIAGNOSIS — I10 Essential (primary) hypertension: Secondary | ICD-10-CM | POA: Insufficient documentation

## 2013-06-07 DIAGNOSIS — Z79899 Other long term (current) drug therapy: Secondary | ICD-10-CM | POA: Insufficient documentation

## 2013-06-07 DIAGNOSIS — S20219A Contusion of unspecified front wall of thorax, initial encounter: Secondary | ICD-10-CM | POA: Insufficient documentation

## 2013-06-07 DIAGNOSIS — S20212A Contusion of left front wall of thorax, initial encounter: Secondary | ICD-10-CM

## 2013-06-07 NOTE — ED Provider Notes (Signed)
CSN: [id number]     Arrival date & time 06/07/13  2230 History   This chart was scribed for Derwood Kaplan, MD by Charline Bills, ED Scribe. The patient was seen in room APA14/APA14. Patient's care was started at 11:50 PM.   Chief Complaint  Patient presents with  . Rheumatoid Arthritis   The history is provided by the patient. No language interpreter was used.   HPI Comments: [name] is a 48 y.o. female who presents to the Emergency Department complaining of gradually worsening rheumatoid arthritis flare up. Pt states that she fell today at 8:30/9 PM and reports associated pain in L thoracic region. Pain is worse with cough and breathing. She also reports mild SOB. Pt states that she has an appointment on 06/13/13 with [facility].  Past Medical History  Diagnosis Date  . Rheumatoid arthritis(714.0)   . Chronic anemia   . Hypertension   . Arthritis, rheumatoid   . Incidental lung nodule   . Depression   . Chronic pain   . Chronic back pain    Past Surgical History  Procedure Laterality Date  . Tubal ligation    . Endometrial ablation    . Breast cyst excision  12/31/2010    Procedure: CYST EXCISION BREAST;  Surgeon: Fabio Bering, MD;  Location: [facility];  Service: General;  Laterality: Right;  Excision Sebaceous Cyst Right Breast   Family History  Problem Relation Age of Onset  . Anesthesia problems Neg Hx   . Cancer Father    History  Substance Use Topics  . Smoking status: Current Every Day Smoker -- 0.03 packs/day for 30 years    Types: Cigarettes  . Smokeless tobacco: Never Used  . Alcohol Use: No     Comment: occassional   OB History   Grav Para Term Preterm Abortions TAB SAB Ect Mult Living   6 5 5  1  1   5      Review of Systems  Respiratory: Positive for shortness of breath.   Musculoskeletal: Positive for arthralgias.  All other systems reviewed and are negative.  Allergies  Codeine  Home Medications   Prior to Admission medications    Medication Sig Start Date End Date Taking? Authorizing Provider  acetaminophen (TYLENOL) 325 MG tablet Take 650 mg by mouth every 6 (six) hours as needed.    Historical Provider, MD  Calcium Carbonate-Vitamin D (CALCIUM 600 + D PO) Take 1 tablet by mouth every morning.     Historical Provider, MD  ferrous sulfate 325 (65 FE) MG tablet Take 325 mg by mouth 2 (two) times daily. For anemia     Historical Provider, MD  folic acid (FOLVITE) 1 MG tablet Take 1 mg by mouth daily.      Historical Provider, MD  HYDROcodone-acetaminophen (NORCO/VICODIN) 5-325 MG per tablet Take 2 tablets by mouth every 4 (four) hours as needed. 05/01/13   05/03/13, MD  HYDROcodone-acetaminophen (NORCO/VICODIN) 5-325 MG per tablet Take 1 tablet by mouth every 4 (four) hours as needed for moderate pain. 05/23/13   07/23/13, PA-C  hydroxychloroquine (PLAQUENIL) 200 MG tablet Take 400 mg by mouth 2 (two) times daily.    Historical Provider, MD  methotrexate (RHEUMATREX) 2.5 MG tablet Take 12.5 mg by mouth 2 (two) times a week. THURSDAYS AND FRIDAYSCaution:Chemotherapy. Protect from light.    Historical Provider, MD  Multiple Vitamin (MULTIVITAMIN WITH MINERALS) TABS Take 1 tablet by mouth daily.    Historical Provider, MD  predniSONE (DELTASONE) 10 MG tablet Take 10-60 mg by mouth See admin instructions. 6,5,4,3,2,1    Historical Provider, MD   Triage Vitals: BP 132/89  Pulse 97  Temp(Src) 98.3 F (36.8 C) (Oral)  Resp 20  Ht 5\' 6"  (1.676 m)  Wt 150 lb (68.04 kg)  BMI 24.22 kg/m2  SpO2 100% Physical Exam  Nursing note and vitals reviewed. Constitutional: She is oriented to person, place, and time. She appears well-developed and well-nourished. No distress.  HENT:  Head: Normocephalic and atraumatic.  Eyes: EOM are normal.  Neck: Neck supple. No tracheal deviation present.  Cardiovascular: Normal rate and regular rhythm.   Pulmonary/Chest: Effort normal. No respiratory distress.  Bilateral equal breath  sounds  Abdominal: Soft.  Musculoskeletal: Normal range of motion.  L lower lateral ribs are tender to palpation No ecchymosis  Neurological: She is alert and oriented to person, place, and time.  Skin: Skin is warm and dry.  Psychiatric: She has a normal mood and affect. Her behavior is normal.    ED Course  Procedures (including critical care time) DIAGNOSTIC STUDIES: Oxygen Saturation is 100% on RA, normal by my interpretation.    COORDINATION OF CARE: 11:55 PM-Discussed treatment plan with pt at bedside and pt agreed to plan.   Labs Review Labs Reviewed - No data to display  Imaging Review Dg Ribs Unilateral W/chest Left  06/08/2013   CLINICAL DATA:  LEFT lateral rib pain after falling tonight, history smoking, rheumatoid arthritis, hypertension  EXAM: LEFT RIBS AND CHEST - 3+ VIEW  COMPARISON:  Chest radiograph 01/24/2012  FINDINGS: Normal heart size, mediastinal contours, and pulmonary vascularity.  Lungs appear hyperinflated but clear.  No pleural effusion or pneumothorax.  Osseous mineralization grossly normal for technique.  BBs placed at site of symptoms lower LEFT chest.  No definite rib fracture or bone destruction.  IMPRESSION: No acute abnormalities.   Electronically Signed   By: 03/23/2012 M.D.   On: 06/08/2013 01:13     EKG Interpretation None      MDM   Final diagnoses:  Contusion of rib on left side    Pt comes in with cc of mechanical fall and resultant chest pain. Has resultant pain on the left side of her chest, which is pleuritic in nature. XRays neg. Will treat as contusion.  I personally performed the services described in this documentation, which was scribed in my presence. The recorded information has been reviewed and is accurate.    06/10/2013, MD 06/08/13 [id number]

## 2013-06-07 NOTE — ED Notes (Signed)
Pt has Rheumatoid Arthritis flare-up and fell today.

## 2013-06-08 ENCOUNTER — Emergency Department (HOSPITAL_COMMUNITY): Payer: Self-pay

## 2013-06-08 MED ORDER — IBUPROFEN 400 MG PO TABS: 400.0000 mg | ORAL_TABLET | Freq: Four times a day (QID) | ORAL | Status: DC | PRN

## 2013-06-08 MED ORDER — METHOCARBAMOL 500 MG PO TABS: 500.0000 mg | ORAL_TABLET | Freq: Two times a day (BID) | ORAL | Status: DC

## 2013-06-08 MED ORDER — HYDROMORPHONE HCL PF 1 MG/ML IJ SOLN
1.0000 mg | Freq: Once | INTRAMUSCULAR | Status: AC
  Administered 2013-06-08: 1 mg via INTRAMUSCULAR
  Filled 2013-06-08: qty 1

## 2013-06-08 NOTE — ED Notes (Signed)
Patient ambulated to bathroom with no difficulty.

## 2013-06-08 NOTE — Discharge Instructions (Signed)
Blunt Chest Trauma Blunt chest trauma is an injury caused by a blow to the chest. These chest injuries can be very painful. Blunt chest trauma often results in bruised or broken (fractured) ribs. Most cases of bruised and fractured ribs from blunt chest traumas get better after 1 to 3 weeks of rest and pain medicine. Often, the soft tissue in the chest wall is also injured, causing pain and bruising. Internal organs, such as the heart and lungs, may also be injured. Blunt chest trauma can lead to serious medical problems. This injury requires immediate medical care. CAUSES   Motor vehicle collisions.  Falls.  Physical violence.  Sports injuries. SYMPTOMS   Chest pain. The pain may be worse when you move or breathe deeply.  Shortness of breath.  Lightheadedness.  Bruising.  Tenderness.  Swelling. DIAGNOSIS  Your caregiver will do a physical exam. X-rays may be taken to look for fractures. However, minor rib fractures may not show up on X-rays until a few days after the injury. If a more serious injury is suspected, further imaging tests may be done. This may include ultrasounds, computed tomography (CT) scans, or magnetic resonance imaging (MRI). TREATMENT  Treatment depends on the severity of your injury. Your caregiver may prescribe pain medicines and deep breathing exercises. HOME CARE INSTRUCTIONS  Limit your activities until you can move around without much pain.  Do not do any strenuous work until your injury is healed.  Put ice on the injured area.  Put ice in a plastic bag.  Place a towel between your skin and the bag.  Leave the ice on for 15-20 minutes, 03-04 times a day.  You may wear a rib belt as directed by your caregiver to reduce pain.  Practice deep breathing as directed by your caregiver to keep your lungs clear.  Only take over-the-counter or prescription medicines for pain, fever, or discomfort as directed by your caregiver. SEEK IMMEDIATE MEDICAL  CARE IF:   You have increasing pain or shortness of breath.  You cough up blood.  You have nausea, vomiting, or abdominal pain.  You have a fever.  You feel dizzy, weak, or you faint. MAKE SURE YOU:  Understand these instructions.  Will watch your condition.  Will get help right away if you are not doing well or get worse. Document Released: 02/06/2004 Document Revised: 03/23/2011 Document Reviewed: 10/15/2010 [facility] Patient Information 2014 [place], [place].  Chest Contusion A chest contusion is a deep bruise on your chest area. Contusions are the result of an injury that caused bleeding under the skin. A chest contusion may involve bruising of the skin, muscles, or ribs. The contusion may turn blue, purple, or yellow. Minor injuries will give you a painless contusion, but more severe contusions may stay painful and swollen for a few weeks. CAUSES  A contusion is usually caused by a blow, trauma, or direct force to an area of the body. SYMPTOMS   Swelling and redness of the injured area.  Discoloration of the injured area.  Tenderness and soreness of the injured area.  Pain. DIAGNOSIS  The diagnosis can be made by taking a history and performing a physical exam. An X-ray, CT scan, or MRI may be needed to determine if there were any associated injuries, such as broken bones (fractures) or internal injuries. TREATMENT  Often, the best treatment for a chest contusion is resting, icing, and applying cold compresses to the injured area. Deep breathing exercises may be recommended to reduce the risk  of pneumonia. Over-the-counter medicines may also be recommended for pain control. HOME CARE INSTRUCTIONS   Put ice on the injured area.  Put ice in a plastic bag.  Place a towel between your skin and the bag.  Leave the ice on for 15-20 minutes, 03-04 times a day.  Only take over-the-counter or prescription medicines as directed by your caregiver. Your caregiver may recommend  avoiding anti-inflammatory medicines (aspirin, ibuprofen, and naproxen) for 48 hours because these medicines may increase bruising.  Rest the injured area.  Perform deep-breathing exercises as directed by your caregiver.  Stop smoking if you smoke.  Do not lift objects over 5 pounds (2.3 kg) for 3 days or longer if recommended by your caregiver. SEEK IMMEDIATE MEDICAL CARE IF:   You have increased bruising or swelling.  You have pain that is getting worse.  You have difficulty breathing.  You have dizziness, weakness, or fainting.  You have blood in your urine or stool.  You cough up or vomit blood.  Your swelling or pain is not relieved with medicines. MAKE SURE YOU:   Understand these instructions.  Will watch your condition.  Will get help right away if you are not doing well or get worse. Document Released: 09/23/2000 Document Revised: 09/23/2011 Document Reviewed: 06/22/2011 [facility] Patient Information 2014 [place], [place].

## 2013-06-13 ENCOUNTER — Emergency Department (HOSPITAL_COMMUNITY)
Admission: EM | Admit: 2013-06-13 | Discharge: 2013-06-13 | Disposition: A | Discharge status: Home / Self Care | Payer: Self-pay | Attending: Emergency Medicine | Admitting: Emergency Medicine

## 2013-06-13 ENCOUNTER — Encounter (HOSPITAL_COMMUNITY): Payer: Self-pay | Admitting: Emergency Medicine

## 2013-06-13 DIAGNOSIS — M19079 Primary osteoarthritis, unspecified ankle and foot: Secondary | ICD-10-CM | POA: Insufficient documentation

## 2013-06-13 DIAGNOSIS — M1990 Unspecified osteoarthritis, unspecified site: Secondary | ICD-10-CM

## 2013-06-13 DIAGNOSIS — G8929 Other chronic pain: Secondary | ICD-10-CM | POA: Insufficient documentation

## 2013-06-13 DIAGNOSIS — F17200 Nicotine dependence, unspecified, uncomplicated: Secondary | ICD-10-CM | POA: Insufficient documentation

## 2013-06-13 DIAGNOSIS — Z8659 Personal history of other mental and behavioral disorders: Secondary | ICD-10-CM | POA: Insufficient documentation

## 2013-06-13 DIAGNOSIS — IMO0002 Reserved for concepts with insufficient information to code with codable children: Secondary | ICD-10-CM | POA: Insufficient documentation

## 2013-06-13 DIAGNOSIS — Z79899 Other long term (current) drug therapy: Secondary | ICD-10-CM | POA: Insufficient documentation

## 2013-06-13 DIAGNOSIS — D649 Anemia, unspecified: Secondary | ICD-10-CM | POA: Insufficient documentation

## 2013-06-13 DIAGNOSIS — I10 Essential (primary) hypertension: Secondary | ICD-10-CM | POA: Insufficient documentation

## 2013-06-13 MED ORDER — HYDROCODONE-ACETAMINOPHEN 5-325 MG PO TABS: 1.0000 | ORAL_TABLET | Freq: Four times a day (QID) | ORAL | Status: DC | PRN

## 2013-06-13 NOTE — ED Notes (Signed)
Pt states that she has not been taking anything for her arthritis and has an appt this week with new doctor

## 2013-06-13 NOTE — ED Provider Notes (Signed)
Medical screening examination/treatment/procedure(s) were performed by non-physician practitioner and as supervising physician I was immediately available for consultation/collaboration.   EKG Interpretation None       Deonne Rooks R. Ishaaq Penna, MD 06/13/13 1528 

## 2013-06-13 NOTE — ED Provider Notes (Signed)
CSN: [id number]     Arrival date & time 06/13/13  1311 History   First MD Initiated Contact with Patient 06/13/13 1320     No chief complaint on file.    (Consider location/radiation/quality/duration/timing/severity/associated sxs/prior Treatment) HPI Comments: Pt states that she is here today for a flare or her rheumatoid arthritidis. She states that she came off all her maintenance meds but she is starting to see a new rheumatologist at [facility] on 6/2. Denies fever. States that the majority of her pain is in her right foot today. States that she has been working [employer] and whenever she works a couple of days in a row she has problems. Denies redness or warmth to the area   Past Medical History  Diagnosis Date  . Rheumatoid arthritis(714.0)   . Chronic anemia   . Hypertension   . Arthritis, rheumatoid   . Incidental lung nodule   . Depression   . Chronic pain   . Chronic back pain    Past Surgical History  Procedure Laterality Date  . Tubal ligation    . Endometrial ablation    . Breast cyst excision  12/31/2010    Procedure: CYST EXCISION BREAST;  Surgeon: Fabio Bering, MD;  Location: [facility];  Service: General;  Laterality: Right;  Excision Sebaceous Cyst Right Breast   Family History  Problem Relation Age of Onset  . Anesthesia problems Neg Hx   . Cancer Father    History  Substance Use Topics  . Smoking status: Current Every Day Smoker -- 0.03 packs/day for 30 years    Types: Cigarettes  . Smokeless tobacco: Never Used  . Alcohol Use: No     Comment: occassional   OB History   Grav Para Term Preterm Abortions TAB SAB Ect Mult Living   6 5 5  1  1   5      Review of Systems  Constitutional: Negative.   Respiratory: Negative.   Cardiovascular: Negative.       Allergies  Codeine  Home Medications   Prior to Admission medications   Medication Sig Start Date End Date Taking? Authorizing Provider  acetaminophen (TYLENOL) 325 MG tablet Take 650 mg by  mouth every 6 (six) hours as needed.    Historical Provider, MD  Calcium Carbonate-Vitamin D (CALCIUM 600 + D PO) Take 1 tablet by mouth every morning.     Historical Provider, MD  ferrous sulfate 325 (65 FE) MG tablet Take 325 mg by mouth 2 (two) times daily. For anemia     Historical Provider, MD  folic acid (FOLVITE) 1 MG tablet Take 1 mg by mouth daily.      Historical Provider, MD  HYDROcodone-acetaminophen (NORCO/VICODIN) 5-325 MG per tablet Take 2 tablets by mouth every 4 (four) hours as needed. 05/01/13   05/03/13, MD  HYDROcodone-acetaminophen (NORCO/VICODIN) 5-325 MG per tablet Take 1 tablet by mouth every 4 (four) hours as needed for moderate pain. 05/23/13   07/23/13, PA-C  HYDROcodone-acetaminophen (NORCO/VICODIN) 5-325 MG per tablet Take 1-2 tablets by mouth every 6 (six) hours as needed. 06/13/13   08/13/13, NP  hydroxychloroquine (PLAQUENIL) 200 MG tablet Take 400 mg by mouth 2 (two) times daily.    Historical Provider, MD  ibuprofen (ADVIL,MOTRIN) 400 MG tablet Take 1 tablet (400 mg total) by mouth every 6 (six) hours as needed. 06/08/13   06/10/13, MD  methocarbamol (ROBAXIN) 500 MG tablet Take 1 tablet (500 mg total) by mouth 2 (  two) times daily. 06/08/13   Derwood Kaplan, MD  methotrexate (RHEUMATREX) 2.5 MG tablet Take 12.5 mg by mouth 2 (two) times a week. THURSDAYS AND FRIDAYSCaution:Chemotherapy. Protect from light.    Historical Provider, MD  Multiple Vitamin (MULTIVITAMIN WITH MINERALS) TABS Take 1 tablet by mouth daily.    Historical Provider, MD  predniSONE (DELTASONE) 10 MG tablet Take 10-60 mg by mouth See admin instructions. 6,5,4,3,2,1    Historical Provider, MD   BP 122/92  Pulse 99  Temp(Src) 98.4 F (36.9 C) (Oral)  Resp 19  SpO2 100%  LMP 06/08/2009 Physical Exam  Nursing note and vitals reviewed. Constitutional: She appears well-developed and well-nourished.  Cardiovascular: Normal rate and regular rhythm.   Pulmonary/Chest: Effort  normal and breath sounds normal.  Musculoskeletal:  No gross deformity or swelling noted to the joints. No redness. Pt has full rom  Neurological: She is alert.  Skin: Skin is warm and dry. No erythema.    ED Course  Procedures (including critical care time) Labs Review Labs Reviewed - No data to display  Imaging Review No results found.   EKG Interpretation None      MDM   Final diagnoses:  Arthritis    Will treat symptomatically. Pt is following up at [facility] and hopefully being back on maintenance medications with help with pain    Teressa Lower, NP 06/13/13 1342

## 2013-06-13 NOTE — ED Notes (Signed)
Pain both feet. , hands and elbows, that pt says is due to arthritis.

## 2013-06-13 NOTE — ED Notes (Signed)
Pt verbalized understanding to use caution and no driving within 4 hours of taking Vicodin due to med causes drowsiness  

## 2013-06-13 NOTE — Discharge Instructions (Signed)

## 2013-06-26 ENCOUNTER — Encounter (HOSPITAL_COMMUNITY): Payer: Self-pay | Admitting: Emergency Medicine

## 2013-06-26 ENCOUNTER — Emergency Department (HOSPITAL_COMMUNITY): Payer: Self-pay

## 2013-06-26 ENCOUNTER — Emergency Department (HOSPITAL_COMMUNITY)
Admission: EM | Admit: 2013-06-26 | Discharge: 2013-06-26 | Disposition: A | Discharge status: Home / Self Care | Payer: Self-pay | Attending: Emergency Medicine | Admitting: Emergency Medicine

## 2013-06-26 DIAGNOSIS — F17200 Nicotine dependence, unspecified, uncomplicated: Secondary | ICD-10-CM | POA: Insufficient documentation

## 2013-06-26 DIAGNOSIS — F3289 Other specified depressive episodes: Secondary | ICD-10-CM | POA: Insufficient documentation

## 2013-06-26 DIAGNOSIS — Z885 Allergy status to narcotic agent status: Secondary | ICD-10-CM | POA: Insufficient documentation

## 2013-06-26 DIAGNOSIS — F329 Major depressive disorder, single episode, unspecified: Secondary | ICD-10-CM | POA: Insufficient documentation

## 2013-06-26 DIAGNOSIS — D649 Anemia, unspecified: Secondary | ICD-10-CM | POA: Insufficient documentation

## 2013-06-26 DIAGNOSIS — G8929 Other chronic pain: Secondary | ICD-10-CM | POA: Insufficient documentation

## 2013-06-26 DIAGNOSIS — R911 Solitary pulmonary nodule: Secondary | ICD-10-CM | POA: Insufficient documentation

## 2013-06-26 DIAGNOSIS — I10 Essential (primary) hypertension: Secondary | ICD-10-CM | POA: Insufficient documentation

## 2013-06-26 DIAGNOSIS — Y939 Activity, unspecified: Secondary | ICD-10-CM | POA: Insufficient documentation

## 2013-06-26 DIAGNOSIS — Y33XXXA Other specified events, undetermined intent, initial encounter: Secondary | ICD-10-CM | POA: Insufficient documentation

## 2013-06-26 DIAGNOSIS — Y9289 Other specified places as the place of occurrence of the external cause: Secondary | ICD-10-CM | POA: Insufficient documentation

## 2013-06-26 DIAGNOSIS — T07XXXA Unspecified multiple injuries, initial encounter: Secondary | ICD-10-CM | POA: Insufficient documentation

## 2013-06-26 DIAGNOSIS — M069 Rheumatoid arthritis, unspecified: Secondary | ICD-10-CM | POA: Insufficient documentation

## 2013-06-26 DIAGNOSIS — M549 Dorsalgia, unspecified: Secondary | ICD-10-CM | POA: Insufficient documentation

## 2013-06-26 DIAGNOSIS — Z79899 Other long term (current) drug therapy: Secondary | ICD-10-CM | POA: Insufficient documentation

## 2013-06-26 MED ORDER — IBUPROFEN 400 MG PO TABS
400.0000 mg | ORAL_TABLET | Freq: Once | ORAL | Status: DC
  Filled 2013-06-26: qty 1
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
## 2013-06-26 NOTE — Discharge Instructions (Signed)
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
## 2013-08-13 ENCOUNTER — Emergency Department (HOSPITAL_COMMUNITY)
Admission: EM | Admit: 2013-08-13 | Discharge: 2013-08-13 | Disposition: A | Discharge status: Home / Self Care | Payer: Self-pay | Attending: Emergency Medicine | Admitting: Emergency Medicine

## 2013-08-13 ENCOUNTER — Encounter (HOSPITAL_COMMUNITY): Payer: Self-pay | Admitting: Emergency Medicine

## 2013-08-13 DIAGNOSIS — IMO0002 Reserved for concepts with insufficient information to code with codable children: Secondary | ICD-10-CM | POA: Insufficient documentation

## 2013-08-13 DIAGNOSIS — D649 Anemia, unspecified: Secondary | ICD-10-CM | POA: Insufficient documentation

## 2013-08-13 DIAGNOSIS — M069 Rheumatoid arthritis, unspecified: Secondary | ICD-10-CM | POA: Insufficient documentation

## 2013-08-13 DIAGNOSIS — F17200 Nicotine dependence, unspecified, uncomplicated: Secondary | ICD-10-CM | POA: Insufficient documentation

## 2013-08-13 DIAGNOSIS — G8929 Other chronic pain: Secondary | ICD-10-CM | POA: Insufficient documentation

## 2013-08-13 DIAGNOSIS — Z8659 Personal history of other mental and behavioral disorders: Secondary | ICD-10-CM | POA: Insufficient documentation

## 2013-08-13 DIAGNOSIS — Z791 Long term (current) use of non-steroidal anti-inflammatories (NSAID): Secondary | ICD-10-CM | POA: Insufficient documentation

## 2013-08-13 DIAGNOSIS — M2550 Pain in unspecified joint: Secondary | ICD-10-CM | POA: Insufficient documentation

## 2013-08-13 DIAGNOSIS — I10 Essential (primary) hypertension: Secondary | ICD-10-CM | POA: Insufficient documentation

## 2013-08-13 DIAGNOSIS — Z79899 Other long term (current) drug therapy: Secondary | ICD-10-CM | POA: Insufficient documentation

## 2013-08-13 MED ORDER — HYDROCODONE-ACETAMINOPHEN 5-325 MG PO TABS: 1.0000 | ORAL_TABLET | ORAL | Status: DC | PRN

## 2013-08-13 MED ORDER — HYDROMORPHONE HCL PF 1 MG/ML IJ SOLN
1.0000 mg | Freq: Once | INTRAMUSCULAR | Status: AC
  Administered 2013-08-13: 1 mg via INTRAMUSCULAR
  Filled 2013-08-13: qty 1

## 2013-08-13 NOTE — ED Notes (Signed)
Pt reports being placed back on maintenance medications for her arthritis. States the pain has been getting worse, mainly in pt's hands.

## 2013-08-13 NOTE — Discharge Instructions (Signed)
Rheumatoid Arthritis Rheumatoid arthritis is a long-term (chronic) inflammatory disease that causes pain, swelling, and stiffness of the joints. It can affect the entire body, including the eyes and lungs. The effects of rheumatoid arthritis vary widely among those with the condition. CAUSES  The cause of rheumatoid arthritis is not known. It tends to run in families and is more common in women. Certain cells of the body's natural defense system (immune system) do not work properly and begin to attack healthy joints. It primarily involves the connective tissue that lines the joints (synovial membrane). This can cause damage to the joint. SYMPTOMS   Pain, stiffness, swelling, and decreased motion of many joints, especially in the hands and feet.  Stiffness that is worse in the morning. It may last 1-2 hours or longer.  Numbness and tingling in the hands.  Fatigue.  Loss of appetite.  Weight loss.  Low-grade fever.  Dry eyes and mouth.  Firm lumps (rheumatoid nodules) that grow beneath the skin in areas such as the elbows and hands. DIAGNOSIS  Diagnosis is based on the symptoms described, an exam, and blood tests. Sometimes, X-rays are helpful. TREATMENT  The goals of treatment are to relieve pain, reduce inflammation, and to slow down or stop joint damage and disability. Methods vary and may include:  Maintaining a balance of rest, exercise, and proper nutrition.  Medicines:  Pain relievers (analgesics).  Corticosteroids and nonsteroidal anti-inflammatory drugs (NSAIDs) to reduce inflammation.  Disease-modifying antirheumatic drugs (DMARDs) to try to slow the course of the disease.  Biologic response modifiers to reduce inflammation and damage.  Physical therapy and occupational therapy.  Surgery for patients with severe joint damage. Joint replacement or fusing of joints may be needed.  Routine monitoring and ongoing care, such as office visits, blood and urine tests, and  X-rays. HOME CARE INSTRUCTIONS   Remain physically active and reduce activity when the disease gets worse.  Eat a well-balanced diet.  Put heat on affected joints when you wake up and before activities. Keep the heat on the affected joint for as long as directed by your health care provider.  Put ice on affected joints following activities or exercising.  Put ice in a plastic bag.  Place a towel between your skin and the bag.  Leave the ice on for 15-20 minutes, 3-4 times per day, or as directed by your health care provider.  Take medicines and supplements only as directed by your health care provider.  Use splints as directed by your health care provider. Splints help maintain joint position and function.  Do not sleep with pillows under your knees. This may lead to spasms.  Participate in a self-management program to keep current with the latest treatment and coping skills. SEEK IMMEDIATE MEDICAL CARE IF:  You have fainting episodes.  You have periods of extreme weakness.  You rapidly develop a hot, painful joint that is more severe than usual joint aches.  You have chills.  You have a fever. FOR MORE INFORMATION   American College of Rheumatology: www.rheumatology.org  Arthritis Foundation: www.arthritis.org Document Released: 12/27/1999 Document Revised: 05/15/2013 Document Reviewed: 02/04/2011 [facility] Patient Information 2015 [place], [place]. This information is not intended to replace advice given to you by your health care provider. Make sure you discuss any questions you have with your health care provider.   You may take the hydrocodone prescribed for pain relief.  This will make you drowsy - do not drive within 4 hours of taking this medication.

## 2013-08-14 NOTE — ED Provider Notes (Signed)
Medical screening examination/treatment/procedure(s) were performed by non-physician practitioner and as supervising physician I was immediately available for consultation/collaboration.   EKG Interpretation None       Hurman Horn, MD 08/14/13 1318

## 2013-08-14 NOTE — ED Provider Notes (Signed)
CSN: [id number]     Arrival date & time 08/13/13  2106 History   First MD Initiated Contact with Patient 08/13/13 2145     Chief Complaint  Patient presents with  . Joint Pain     (Consider location/radiation/quality/duration/timing/severity/associated sxs/prior Treatment) HPI  [name] is a 48 y.o. female presenting for assistance with chronic intermittent joint pain associated with her rheumatoid arthritis.  She has was started back on methotrexate 3 weeks ago after being off this medicines for a while, and it has not fully kicked in yet.  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  She denies fevers or chills and has no specific injury,  Simply believes her pain is from overuse.  She has taken tylenol and naproxen without relief.      Past Medical History  Diagnosis Date  . Rheumatoid arthritis(714.0)   . Chronic anemia   . Hypertension   . Arthritis, rheumatoid   . Incidental lung nodule   . Depression   . Chronic pain   . Chronic back pain    Past Surgical History  Procedure Laterality Date  . Tubal ligation    . Endometrial ablation    . Breast cyst excision  12/31/2010    Procedure: CYST EXCISION BREAST;  Surgeon: Fabio Bering, MD;  Location: [facility];  Service: General;  Laterality: Right;  Excision Sebaceous Cyst Right Breast   Family History  Problem Relation Age of Onset  . Anesthesia problems Neg Hx   . Cancer Father    History  Substance Use Topics  . Smoking status: Current Every Day Smoker -- 0.03 packs/day for 30 years    Types: Cigarettes  . Smokeless tobacco: Never Used  . Alcohol Use: No     Comment: occassional   OB History   Grav Para Term Preterm Abortions TAB SAB Ect Mult Living   6 5 5  1  1   5      Review of Systems  Constitutional: Negative for fever.  HENT: Negative.  Negative for sore throat.   Eyes: Negative.   Respiratory:  Negative.   Cardiovascular: Negative for chest pain.  Gastrointestinal: Negative for nausea and abdominal pain.  Genitourinary: Negative.   Musculoskeletal: Positive for arthralgias. Negative for joint swelling and neck pain.  Skin: Negative.  Negative for rash and wound.  Neurological: Negative for dizziness, weakness, light-headedness, numbness and headaches.  Psychiatric/Behavioral: Negative.       Allergies  Codeine  Home Medications   Prior to Admission medications   Medication Sig Start Date End Date Taking? Authorizing Provider  acetaminophen (TYLENOL) 325 MG tablet Take 650 mg by mouth every 6 (six) hours as needed. pain   Yes Historical Provider, MD  Calcium Carbonate-Vitamin D (CALCIUM 600 + D PO) Take 1 tablet by mouth every morning.    Yes Historical Provider, MD  ferrous sulfate 325 (65 FE) MG tablet Take 325 mg by mouth 2 (two) times daily. For anemia    Yes Historical Provider, MD  folic acid (FOLVITE) 1 MG tablet Take 1 mg by mouth daily.     Yes Historical Provider, MD  hydroxychloroquine (PLAQUENIL) 200 MG tablet Take 400 mg by mouth 2 (two) times daily.   Yes Historical Provider, MD  Multiple Vitamin (MULTIVITAMIN WITH MINERALS) TABS Take 1 tablet by mouth daily.   Yes Historical Provider, MD  naproxen (  NAPROSYN) 500 MG tablet Take 500 mg by mouth 2 (two) times daily with a meal.   Yes Historical Provider, MD  predniSONE (DELTASONE) 10 MG tablet Take 10 mg by mouth 2 (two) times daily.    Yes Historical Provider, MD  HYDROcodone-acetaminophen (NORCO/VICODIN) 5-325 MG per tablet Take 1 tablet by mouth every 4 (four) hours as needed. 08/13/13   Burgess Amor, PA-C  methotrexate (RHEUMATREX) 2.5 MG tablet Take 12.5 mg by mouth once a week. Monday Caution:Chemotherapy. Protect from light.    Historical Provider, MD   BP 141/95  Pulse 64  Temp(Src) 98.2 F (36.8 C) (Oral)  Resp 22  Ht 5\' 6"  (1.676 m)  Wt 158 lb (71.668 kg)  BMI 25.51 kg/m2  SpO2 100% Physical Exam   Constitutional: She appears well-developed and well-nourished.  HENT:  Head: Atraumatic.  Neck: Normal range of motion.  Cardiovascular:  Pulses equal bilaterally  Musculoskeletal: She exhibits edema and tenderness.  ulner deviation of bilateral fingers with with MCP joint enlargement.  TTP.  No erythema or increased warmth in hands.  Distal sensation intact with less than 2 sec cap refill.   Neurological: She is alert. She has normal strength. She displays normal reflexes. No sensory deficit.  Skin: Skin is warm and dry.  Psychiatric: She has a normal mood and affect.    ED Course  Procedures (including critical care time) Labs Review Labs Reviewed - No data to display  Imaging Review No results found.   EKG Interpretation None      MDM   Final diagnoses:  Rheumatoid arthritis flare    Dilaudid 1 mg IM given.  Prescription for hydrocodone.  Encouraged f/u with her pcp and/or rheumatologist prn worsened sx.  Sx tx given.  No acute findings.  No exam findings to suggest gout, no h/o trauma.    The patient appears reasonably screened and/or stabilized for discharge and I doubt any other medical condition or other [facility] requiring further screening, evaluation, or treatment in the ED at this time prior to discharge.     [facility], PA-C 08/14/13 [phone]

## 2013-08-28 ENCOUNTER — Encounter (HOSPITAL_COMMUNITY): Payer: Self-pay | Admitting: Emergency Medicine

## 2013-08-28 DIAGNOSIS — R1110 Vomiting, unspecified: Secondary | ICD-10-CM | POA: Insufficient documentation

## 2013-08-28 DIAGNOSIS — Z79899 Other long term (current) drug therapy: Secondary | ICD-10-CM | POA: Insufficient documentation

## 2013-08-28 DIAGNOSIS — Z791 Long term (current) use of non-steroidal anti-inflammatories (NSAID): Secondary | ICD-10-CM | POA: Insufficient documentation

## 2013-08-28 DIAGNOSIS — D649 Anemia, unspecified: Secondary | ICD-10-CM | POA: Insufficient documentation

## 2013-08-28 DIAGNOSIS — M2550 Pain in unspecified joint: Secondary | ICD-10-CM | POA: Insufficient documentation

## 2013-08-28 DIAGNOSIS — G8929 Other chronic pain: Secondary | ICD-10-CM | POA: Insufficient documentation

## 2013-08-28 DIAGNOSIS — IMO0002 Reserved for concepts with insufficient information to code with codable children: Secondary | ICD-10-CM | POA: Insufficient documentation

## 2013-08-28 DIAGNOSIS — I10 Essential (primary) hypertension: Secondary | ICD-10-CM | POA: Insufficient documentation

## 2013-08-28 DIAGNOSIS — F17200 Nicotine dependence, unspecified, uncomplicated: Secondary | ICD-10-CM | POA: Insufficient documentation

## 2013-08-28 DIAGNOSIS — M069 Rheumatoid arthritis, unspecified: Secondary | ICD-10-CM | POA: Insufficient documentation

## 2013-08-28 DIAGNOSIS — Z8659 Personal history of other mental and behavioral disorders: Secondary | ICD-10-CM | POA: Insufficient documentation

## 2013-08-28 NOTE — ED Notes (Signed)
Having vomiting this am.  Have not taken anything for nausea.

## 2013-08-29 ENCOUNTER — Emergency Department (HOSPITAL_COMMUNITY)
Admission: EM | Admit: 2013-08-29 | Discharge: 2013-08-29 | Disposition: A | Discharge status: Home / Self Care | Payer: Self-pay | Attending: Emergency Medicine | Admitting: Emergency Medicine

## 2013-08-29 DIAGNOSIS — R1111 Vomiting without nausea: Secondary | ICD-10-CM

## 2013-08-29 LAB — URINALYSIS, ROUTINE W REFLEX MICROSCOPIC
BILIRUBIN URINE: NEGATIVE
Glucose, UA: NEGATIVE mg/dL
Ketones, ur: NEGATIVE mg/dL
Nitrite: NEGATIVE
Protein, ur: NEGATIVE mg/dL
Specific Gravity, Urine: 1.01 (ref 1.005–1.030)
UROBILINOGEN UA: 0.2 mg/dL (ref 0.0–1.0)
pH: 6 (ref 5.0–8.0)

## 2013-08-29 LAB — COMPREHENSIVE METABOLIC PANEL
ALK PHOS: 88 U/L (ref 39–117)
ALT: 9 U/L (ref 0–35)
AST: 15 U/L (ref 0–37)
Albumin: 3.6 g/dL (ref 3.5–5.2)
Anion gap: 14 (ref 5–15)
BUN: 8 mg/dL (ref 6–23)
CO2: 28 meq/L (ref 19–32)
Calcium: 9.2 mg/dL (ref 8.4–10.5)
Chloride: 101 mEq/L (ref 96–112)
Creatinine, Ser: 0.64 mg/dL (ref 0.50–1.10)
GFR calc non Af Amer: >90 mL/min (ref >90)
GLUCOSE: 92 mg/dL (ref 70–99)
Potassium: 3.8 mEq/L (ref 3.7–5.3)
SODIUM: 143 meq/L (ref 137–147)
Total Bilirubin: <0.2 mg/dL — ABNORMAL LOW (ref 0.3–1.2)
Total Protein: 7.2 g/dL (ref 6.0–8.3)

## 2013-08-29 LAB — CBC WITH DIFFERENTIAL/PLATELET
Basophils Absolute: 0 10*3/uL (ref 0.0–0.1)
Basophils Relative: 0 % (ref 0–1)
EOS PCT: 3 % (ref 0–5)
Eosinophils Absolute: 0.3 10*3/uL (ref 0.0–0.7)
HEMATOCRIT: 33.3 % — AB (ref 36.0–46.0)
Hemoglobin: 10.8 g/dL — ABNORMAL LOW (ref 12.0–15.0)
LYMPHS ABS: 2 10*3/uL (ref 0.7–4.0)
LYMPHS PCT: 20 % (ref 12–46)
MCH: 25.8 pg — ABNORMAL LOW (ref 26.0–34.0)
MCHC: 32.4 g/dL (ref 30.0–36.0)
MCV: 79.5 fL (ref 78.0–100.0)
MONO ABS: 0.6 10*3/uL (ref 0.1–1.0)
MONOS PCT: 6 % (ref 3–12)
Neutro Abs: 7 10*3/uL (ref 1.7–7.7)
Neutrophils Relative %: 71 % (ref 43–77)
PLATELETS: 432 10*3/uL — AB (ref 150–400)
RBC: 4.19 MIL/uL (ref 3.87–5.11)
RDW: 17.5 % — ABNORMAL HIGH (ref 11.5–15.5)
WBC: 9.9 10*3/uL (ref 4.0–10.5)

## 2013-08-29 LAB — URINE MICROSCOPIC-ADD ON

## 2013-08-29 MED ORDER — HYDROMORPHONE HCL PF 1 MG/ML IJ SOLN
1.0000 mg | Freq: Once | INTRAMUSCULAR | Status: AC
Start: 2013-08-29 — End: 2013-08-29
  Administered 2013-08-29: 1 mg via INTRAVENOUS
  Filled 2013-08-29: qty 1

## 2013-08-29 MED ORDER — HYDROMORPHONE HCL PF 1 MG/ML IJ SOLN
1.0000 mg | Freq: Once | INTRAMUSCULAR | Status: AC
  Administered 2013-08-29: 1 mg via INTRAVENOUS
  Filled 2013-08-29: qty 1

## 2013-08-29 MED ORDER — SODIUM CHLORIDE 0.9 % IV BOLUS (SEPSIS)
1000.0000 mL | Freq: Once | INTRAVENOUS | Status: AC
  Administered 2013-08-29: 1000 mL via INTRAVENOUS

## 2013-08-29 MED ORDER — HYDROCODONE-ACETAMINOPHEN 5-325 MG PO TABS: 1.0000 | ORAL_TABLET | Freq: Four times a day (QID) | ORAL | Status: DC | PRN

## 2013-08-29 MED ORDER — ONDANSETRON HCL 4 MG/2ML IJ SOLN
4.0000 mg | Freq: Once | INTRAMUSCULAR | Status: AC
  Administered 2013-08-29: 4 mg via INTRAVENOUS
  Filled 2013-08-29: qty 2

## 2013-08-29 NOTE — Discharge Instructions (Signed)
Take 20 mg of prednisone twice a day for 3 days

## 2013-08-29 NOTE — ED Provider Notes (Signed)
CSN: [id number]     Arrival date & time 08/28/13  2253 History   First MD Initiated Contact with Patient 08/29/13 0115     Chief Complaint  Patient presents with  . Emesis     (Consider location/radiation/quality/duration/timing/severity/associated sxs/prior Treatment) Patient is a 48 y.o. female presenting with vomiting. The history is provided by the patient (the pt complains of vomiting and joint pain).  Emesis Severity:  Moderate Quality:  Bilious material Able to tolerate:  Liquids Progression:  Worsening Chronicity:  Recurrent Recent urination:  Normal Relieved by:  Nothing Associated symptoms: no abdominal pain, no diarrhea and no headaches     Past Medical History  Diagnosis Date  . Rheumatoid arthritis(714.0)   . Chronic anemia   . Hypertension   . Arthritis, rheumatoid   . Incidental lung nodule   . Depression   . Chronic pain   . Chronic back pain    Past Surgical History  Procedure Laterality Date  . Tubal ligation    . Endometrial ablation    . Breast cyst excision  12/31/2010    Procedure: CYST EXCISION BREAST;  Surgeon: Fabio Bering, MD;  Location: [facility];  Service: General;  Laterality: Right;  Excision Sebaceous Cyst Right Breast   Family History  Problem Relation Age of Onset  . Anesthesia problems Neg Hx   . Cancer Father    History  Substance Use Topics  . Smoking status: Current Every Day Smoker -- 0.03 packs/day for 30 years    Types: Cigarettes  . Smokeless tobacco: Never Used  . Alcohol Use: No     Comment: occassional   OB History   Grav Para Term Preterm Abortions TAB SAB Ect Mult Living   6 5 5  1  1   5      Review of Systems  Constitutional: Negative for appetite change and fatigue.  HENT: Negative for congestion, ear discharge and sinus pressure.   Eyes: Negative for discharge.  Respiratory: Negative for cough.   Cardiovascular: Negative for chest pain.  Gastrointestinal: Positive for vomiting. Negative for abdominal pain  and diarrhea.  Genitourinary: Negative for frequency and hematuria.  Musculoskeletal: Negative for back pain.       Joint pain  Skin: Negative for rash.  Neurological: Negative for seizures and headaches.  Psychiatric/Behavioral: Negative for hallucinations.      Allergies  Codeine  Home Medications   Prior to Admission medications   Medication Sig Start Date End Date Taking? Authorizing Provider  acetaminophen (TYLENOL) 325 MG tablet Take 650 mg by mouth every 6 (six) hours as needed. pain    Historical Provider, MD  Calcium Carbonate-Vitamin D (CALCIUM 600 + D PO) Take 1 tablet by mouth every morning.     Historical Provider, MD  ferrous sulfate 325 (65 FE) MG tablet Take 325 mg by mouth 2 (two) times daily. For anemia     Historical Provider, MD  folic acid (FOLVITE) 1 MG tablet Take 1 mg by mouth daily.      Historical Provider, MD  HYDROcodone-acetaminophen (NORCO/VICODIN) 5-325 MG per tablet Take 1 tablet by mouth every 4 (four) hours as needed. 08/13/13   10/13/13, PA-C  HYDROcodone-acetaminophen (NORCO/VICODIN) 5-325 MG per tablet Take 1 tablet by mouth every 6 (six) hours as needed. 08/29/13   08/31/13, MD  hydroxychloroquine (PLAQUENIL) 200 MG tablet Take 400 mg by mouth 2 (two) times daily.    Historical Provider, MD  methotrexate (RHEUMATREX) 2.5 MG tablet Take 12.5  mg by mouth once a week. Monday Caution:Chemotherapy. Protect from light.    Historical Provider, MD  Multiple Vitamin (MULTIVITAMIN WITH MINERALS) TABS Take 1 tablet by mouth daily.    Historical Provider, MD  naproxen (NAPROSYN) 500 MG tablet Take 500 mg by mouth 2 (two) times daily with a meal.    Historical Provider, MD  predniSONE (DELTASONE) 10 MG tablet Take 10 mg by mouth 2 (two) times daily.     Historical Provider, MD   BP 140/95  Pulse 91  Temp(Src) 99.2 F (37.3 C) (Oral)  Resp 20  Ht 5\' 6"  (1.676 m)  Wt 158 lb (71.668 kg)  BMI 25.51 kg/m2  SpO2 100% Physical Exam  Constitutional: She  is oriented to person, place, and time. She appears well-developed.  HENT:  Head: Normocephalic.  Eyes: Conjunctivae and EOM are normal. No scleral icterus.  Neck: Neck supple. No thyromegaly present.  Cardiovascular: Normal rate and regular rhythm.  Exam reveals no gallop and no friction rub.   No murmur heard. Pulmonary/Chest: No stridor. She has no wheezes. She has no rales. She exhibits no tenderness.  Abdominal: She exhibits no distension. There is no tenderness. There is no rebound.  Musculoskeletal: She exhibits no edema.  Swelling to ankles with pain  Lymphadenopathy:    She has no cervical adenopathy.  Neurological: She is oriented to person, place, and time. She exhibits normal muscle tone. Coordination normal.  Skin: No rash noted. No erythema.  Psychiatric: She has a normal mood and affect. Her behavior is normal.    ED Course  Procedures (including critical care time) Labs Review Labs Reviewed  CBC WITH DIFFERENTIAL - Abnormal; Notable for the following:    Hemoglobin 10.8 (*)    HCT 33.3 (*)    MCH 25.8 (*)    RDW 17.5 (*)    Platelets 432 (*)    All other components within normal limits  COMPREHENSIVE METABOLIC PANEL - Abnormal; Notable for the following:    Total Bilirubin <0.2 (*)    All other components within normal limits  URINALYSIS, ROUTINE W REFLEX MICROSCOPIC - Abnormal; Notable for the following:    Hgb urine dipstick TRACE (*)    Leukocytes, UA TRACE (*)    All other components within normal limits  URINE MICROSCOPIC-ADD ON - Abnormal; Notable for the following:    Squamous Epithelial / LPF MANY (*)    Bacteria, UA FEW (*)    All other components within normal limits    Imaging Review No results found.   EKG Interpretation None      MDM   Final diagnoses:  Non-intractable vomiting without nausea, vomiting of unspecified type    Vomiting improved.  For RA pt is to increase prednisone    , MD 08/29/13 [phone]

## 2013-09-01 ENCOUNTER — Emergency Department (HOSPITAL_COMMUNITY)
Admission: EM | Admit: 2013-09-01 | Discharge: 2013-09-01 | Disposition: A | Discharge status: Home / Self Care | Payer: Self-pay | Attending: Emergency Medicine | Admitting: Emergency Medicine

## 2013-09-01 ENCOUNTER — Encounter (HOSPITAL_COMMUNITY): Payer: Self-pay | Admitting: Emergency Medicine

## 2013-09-01 DIAGNOSIS — Z8659 Personal history of other mental and behavioral disorders: Secondary | ICD-10-CM | POA: Insufficient documentation

## 2013-09-01 DIAGNOSIS — G8929 Other chronic pain: Secondary | ICD-10-CM | POA: Insufficient documentation

## 2013-09-01 DIAGNOSIS — M069 Rheumatoid arthritis, unspecified: Secondary | ICD-10-CM | POA: Insufficient documentation

## 2013-09-01 DIAGNOSIS — R34 Anuria and oliguria: Secondary | ICD-10-CM | POA: Insufficient documentation

## 2013-09-01 DIAGNOSIS — Z791 Long term (current) use of non-steroidal anti-inflammatories (NSAID): Secondary | ICD-10-CM | POA: Insufficient documentation

## 2013-09-01 DIAGNOSIS — IMO0002 Reserved for concepts with insufficient information to code with codable children: Secondary | ICD-10-CM | POA: Insufficient documentation

## 2013-09-01 DIAGNOSIS — Z79899 Other long term (current) drug therapy: Secondary | ICD-10-CM | POA: Insufficient documentation

## 2013-09-01 DIAGNOSIS — I10 Essential (primary) hypertension: Secondary | ICD-10-CM | POA: Insufficient documentation

## 2013-09-01 DIAGNOSIS — F17200 Nicotine dependence, unspecified, uncomplicated: Secondary | ICD-10-CM | POA: Insufficient documentation

## 2013-09-01 DIAGNOSIS — R110 Nausea: Secondary | ICD-10-CM | POA: Insufficient documentation

## 2013-09-01 DIAGNOSIS — D649 Anemia, unspecified: Secondary | ICD-10-CM | POA: Insufficient documentation

## 2013-09-01 LAB — BASIC METABOLIC PANEL
ANION GAP: 15 (ref 5–15)
BUN: 8 mg/dL (ref 6–23)
CO2: 26 meq/L (ref 19–32)
CREATININE: 0.63 mg/dL (ref 0.50–1.10)
Calcium: 9.6 mg/dL (ref 8.4–10.5)
Chloride: 100 mEq/L (ref 96–112)
GFR calc Af Amer: >90 mL/min (ref >90)
GFR calc non Af Amer: >90 mL/min (ref >90)
Glucose, Bld: 92 mg/dL (ref 70–99)
Potassium: 3.8 mEq/L (ref 3.7–5.3)
Sodium: 141 mEq/L (ref 137–147)

## 2013-09-01 LAB — CBC WITH DIFFERENTIAL/PLATELET
Basophils Absolute: 0 10*3/uL (ref 0.0–0.1)
Basophils Relative: 0 % (ref 0–1)
Eosinophils Absolute: 0.2 10*3/uL (ref 0.0–0.7)
Eosinophils Relative: 4 % (ref 0–5)
HEMATOCRIT: 32.4 % — AB (ref 36.0–46.0)
HEMOGLOBIN: 10.3 g/dL — AB (ref 12.0–15.0)
Lymphocytes Relative: 27 % (ref 12–46)
Lymphs Abs: 1.7 10*3/uL (ref 0.7–4.0)
MCH: 25.2 pg — ABNORMAL LOW (ref 26.0–34.0)
MCHC: 31.8 g/dL (ref 30.0–36.0)
MCV: 79.2 fL (ref 78.0–100.0)
MONO ABS: 0.2 10*3/uL (ref 0.1–1.0)
MONOS PCT: 3 % (ref 3–12)
Neutro Abs: 4.2 10*3/uL (ref 1.7–7.7)
Neutrophils Relative %: 66 % (ref 43–77)
Platelets: 449 10*3/uL — ABNORMAL HIGH (ref 150–400)
RBC: 4.09 MIL/uL (ref 3.87–5.11)
RDW: 17.6 % — ABNORMAL HIGH (ref 11.5–15.5)
WBC: 6.4 10*3/uL (ref 4.0–10.5)

## 2013-09-01 LAB — URINALYSIS, ROUTINE W REFLEX MICROSCOPIC
BILIRUBIN URINE: NEGATIVE
Glucose, UA: NEGATIVE mg/dL
Ketones, ur: NEGATIVE mg/dL
Leukocytes, UA: NEGATIVE
NITRITE: NEGATIVE
PH: 6 (ref 5.0–8.0)
Protein, ur: NEGATIVE mg/dL
SPECIFIC GRAVITY, URINE: 1.01 (ref 1.005–1.030)
Urobilinogen, UA: 0.2 mg/dL (ref 0.0–1.0)

## 2013-09-01 LAB — URINE MICROSCOPIC-ADD ON

## 2013-09-01 MED ORDER — MORPHINE SULFATE 4 MG/ML IJ SOLN
4.0000 mg | Freq: Once | INTRAMUSCULAR | Status: AC
  Administered 2013-09-01: 4 mg via INTRAVENOUS
  Filled 2013-09-01: qty 1

## 2013-09-01 MED ORDER — SODIUM CHLORIDE 0.9 % IV BOLUS (SEPSIS)
1000.0000 mL | Freq: Once | INTRAVENOUS | Status: AC
  Administered 2013-09-01: 1000 mL via INTRAVENOUS

## 2013-09-01 NOTE — ED Notes (Signed)
Pt alert & oriented x4, stable gait. Patient given discharge instructions, paperwork & prescription(s). Patient  instructed to stop at the registration desk to finish any additional paperwork. Patient verbalized understanding. Pt left department w/ no further questions. 

## 2013-09-01 NOTE — ED Notes (Signed)
Bladder scan should 202 ml of fluid in bladder.

## 2013-09-01 NOTE — Discharge Instructions (Signed)
Continue your medications as before.  Followup with your primary Dr. to be reevaluated sometime this week. Return to the emergency department if your symptoms substantially worsen or change.

## 2013-09-01 NOTE — ED Provider Notes (Signed)
CSN: [id number]     Arrival date & time 09/01/13  1628 History   This chart was scribed for Geoffery Lyons, MD, by Yevette Edwards, ED Scribe. This patient was seen in room [facility] and the patient's care was started at 5:51 PM.  First MD Initiated Contact with Patient 09/01/13 1749     Chief Complaint  Patient presents with  . not voiding     The history is provided by the patient. No language interpreter was used.   HPI Comments: [name] is a 48 y.o. female who presents to the Emergency Department complaining of decreased urination. The pt also reports she feels dehydrated and she experienced an episode of emesis yesterday evening. She also complains of color changes and pain to her knuckles and ankles which she attributes to Rheumatoid arthritis; she has used Vicodin without relief. She began taking Methotrexate six weeks ago; she also has a h/o using methotrexate several years ago for seven years. [name] denies dysuria, back pain, or abdominal pain. She was treated for emesis and joint pain in the ED three days ago.   Past Medical History  Diagnosis Date  . Rheumatoid arthritis(714.0)   . Chronic anemia   . Hypertension   . Arthritis, rheumatoid   . Incidental lung nodule   . Depression   . Chronic pain   . Chronic back pain    Past Surgical History  Procedure Laterality Date  . Tubal ligation    . Endometrial ablation    . Breast cyst excision  12/31/2010    Procedure: CYST EXCISION BREAST;  Surgeon: Fabio Bering, MD;  Location: [facility];  Service: General;  Laterality: Right;  Excision Sebaceous Cyst Right Breast   Family History  Problem Relation Age of Onset  . Anesthesia problems Neg Hx   . Cancer Father    History  Substance Use Topics  . Smoking status: Current Every Day Smoker -- 0.03 packs/day for 30 years    Types: Cigarettes  . Smokeless tobacco: Never Used  . Alcohol Use: No     Comment: occassional   OB History   Grav Para Term Preterm Abortions  TAB SAB Ect Mult Living   6 5 5  1  1   5      Review of Systems  Gastrointestinal: Positive for vomiting. Negative for abdominal pain.  Genitourinary: Positive for decreased urine volume. Negative for dysuria.  Musculoskeletal: Positive for arthralgias and joint swelling. Negative for back pain.    Allergies  Codeine  Home Medications   Prior to Admission medications   Medication Sig Start Date End Date Taking? Authorizing Provider  acetaminophen (TYLENOL) 325 MG tablet Take 650 mg by mouth every 6 (six) hours as needed. pain    Historical Provider, MD  Calcium Carbonate-Vitamin D (CALCIUM 600 + D PO) Take 1 tablet by mouth every morning.     Historical Provider, MD  ferrous sulfate 325 (65 FE) MG tablet Take 325 mg by mouth 2 (two) times daily. For anemia     Historical Provider, MD  folic acid (FOLVITE) 1 MG tablet Take 1 mg by mouth daily.      Historical Provider, MD  HYDROcodone-acetaminophen (NORCO/VICODIN) 5-325 MG per tablet Take 1 tablet by mouth every 4 (four) hours as needed. 08/13/13   10/13/13, PA-C  HYDROcodone-acetaminophen (NORCO/VICODIN) 5-325 MG per tablet Take 1 tablet by mouth every 6 (six) hours as needed. 08/29/13   08/31/13, MD  hydroxychloroquine (PLAQUENIL) 200  MG tablet Take 400 mg by mouth 2 (two) times daily.    Historical Provider, MD  methotrexate (RHEUMATREX) 2.5 MG tablet Take 12.5 mg by mouth once a week. Monday Caution:Chemotherapy. Protect from light.    Historical Provider, MD  Multiple Vitamin (MULTIVITAMIN WITH MINERALS) TABS Take 1 tablet by mouth daily.    Historical Provider, MD  naproxen (NAPROSYN) 500 MG tablet Take 500 mg by mouth 2 (two) times daily with a meal.    Historical Provider, MD  predniSONE (DELTASONE) 10 MG tablet Take 10 mg by mouth 2 (two) times daily.     Historical Provider, MD   Triage Vitals: BP 130/97  Pulse 81  Temp(Src) 99 F (37.2 C) (Oral)  Resp 20  SpO2 99%  Physical Exam  Nursing note and vitals  reviewed. Constitutional: She is oriented to person, place, and time. She appears well-developed and well-nourished. No distress.  HENT:  Head: Normocephalic and atraumatic.  Eyes: Conjunctivae and EOM are normal.  Neck: Neck supple. No tracheal deviation present.  Cardiovascular: Normal rate, regular rhythm and normal heart sounds.   Pulmonary/Chest: Effort normal and breath sounds normal. No respiratory distress. She has no wheezes.  Abdominal: Soft. She exhibits no distension. There is no tenderness.  Musculoskeletal: Normal range of motion.  Neurological: She is alert and oriented to person, place, and time.  Skin: Skin is warm and dry.  Psychiatric: She has a normal mood and affect. Her behavior is normal.    ED Course  Procedures (including critical care time)  DIAGNOSTIC STUDIES: Oxygen Saturation is 99% on room air, normal by my interpretation.    COORDINATION OF CARE:  6:02 PM- Discussed treatment plan with patient, and the patient agreed to the plan. The plan includes IV fluids. The pt requested pain medication.   8:37 PM- Rechecked pt. Pt requests pain medication.   Labs Review Labs Reviewed  CBC WITH DIFFERENTIAL - Abnormal; Notable for the following:    Hemoglobin 10.3 (*)    HCT 32.4 (*)    MCH 25.2 (*)    RDW 17.6 (*)    Platelets 449 (*)    All other components within normal limits  URINALYSIS, ROUTINE W REFLEX MICROSCOPIC - Abnormal; Notable for the following:    Hgb urine dipstick TRACE (*)    All other components within normal limits  BASIC METABOLIC PANEL  URINE MICROSCOPIC-ADD ON    Imaging Review No results found.   EKG Interpretation None      MDM   Final diagnoses:  None    Patient presents with complaints of nausea and decreased by mouth intake since restarting her arthritis medications. She is concerned that she is becoming dehydrated as she has not urinated all day. Laboratory studies reveal no electrolyte abnormalities suggestive of  dehydration. She was hydrated with 1 L of normal saline and urinated in the emergency department. This reveals no evidence for infection. At this point I feel as though she is appropriate for discharge. She is to followup with her primary Dr.  I personally performed the services described in this documentation, which was scribed in my presence. The recorded information has been reviewed and is accurate.       Geoffery Lyons, MD 09/01/13 2040

## 2013-09-01 NOTE — ED Notes (Signed)
Seen here for same x 5 days ago - reports is not any better.  C/o pain and swelling to both hands and both feet.  States nausea is better.  Drinking plenty of fluids with scant amount of urine output.

## 2013-09-16 ENCOUNTER — Emergency Department (HOSPITAL_COMMUNITY)
Admission: EM | Admit: 2013-09-16 | Discharge: 2013-09-17 | Disposition: A | Discharge status: Home / Self Care | Payer: Self-pay | Attending: Emergency Medicine | Admitting: Emergency Medicine

## 2013-09-16 ENCOUNTER — Encounter (HOSPITAL_COMMUNITY): Payer: Self-pay | Admitting: Emergency Medicine

## 2013-09-16 DIAGNOSIS — Z79899 Other long term (current) drug therapy: Secondary | ICD-10-CM | POA: Insufficient documentation

## 2013-09-16 DIAGNOSIS — R1032 Left lower quadrant pain: Secondary | ICD-10-CM | POA: Insufficient documentation

## 2013-09-16 DIAGNOSIS — F17200 Nicotine dependence, unspecified, uncomplicated: Secondary | ICD-10-CM | POA: Insufficient documentation

## 2013-09-16 DIAGNOSIS — R109 Unspecified abdominal pain: Secondary | ICD-10-CM

## 2013-09-16 DIAGNOSIS — I10 Essential (primary) hypertension: Secondary | ICD-10-CM | POA: Insufficient documentation

## 2013-09-16 DIAGNOSIS — G8929 Other chronic pain: Secondary | ICD-10-CM | POA: Insufficient documentation

## 2013-09-16 DIAGNOSIS — Z8659 Personal history of other mental and behavioral disorders: Secondary | ICD-10-CM | POA: Insufficient documentation

## 2013-09-16 DIAGNOSIS — M549 Dorsalgia, unspecified: Secondary | ICD-10-CM | POA: Insufficient documentation

## 2013-09-16 DIAGNOSIS — Z9889 Other specified postprocedural states: Secondary | ICD-10-CM | POA: Insufficient documentation

## 2013-09-16 DIAGNOSIS — Z3202 Encounter for pregnancy test, result negative: Secondary | ICD-10-CM | POA: Insufficient documentation

## 2013-09-16 DIAGNOSIS — R34 Anuria and oliguria: Secondary | ICD-10-CM | POA: Insufficient documentation

## 2013-09-16 DIAGNOSIS — Z9851 Tubal ligation status: Secondary | ICD-10-CM | POA: Insufficient documentation

## 2013-09-16 DIAGNOSIS — D649 Anemia, unspecified: Secondary | ICD-10-CM | POA: Insufficient documentation

## 2013-09-16 DIAGNOSIS — IMO0002 Reserved for concepts with insufficient information to code with codable children: Secondary | ICD-10-CM | POA: Insufficient documentation

## 2013-09-16 DIAGNOSIS — M069 Rheumatoid arthritis, unspecified: Secondary | ICD-10-CM | POA: Insufficient documentation

## 2013-09-16 NOTE — ED Provider Notes (Signed)
CSN: [id number]     Arrival date & time 09/16/13  2324 History  This chart was scribed for Monica Booze, MD by Modena Jansky, ED Scribe. This patient was seen in room [facility]10/[facility]10 and the patient's care was started at 12:14 AM.   Chief Complaint  Patient presents with  . Back Pain  . Urinary Retention   The history is provided by the patient. No language interpreter was used.   HPI Comments: [name] is a 48 y.o. female who presents to the Emergency Department complaining of constant moderate RUQ pain that started yesterday. She states that she also as left flank pain that started yesterday. She currently rates the pain as a 8/10 in severity. She reports that she took ibuprofen without relief. She reports that she has not urinated or voided since lunch time today. She states that the dose of her RA medication recently. She denies any nausea, emesis, or diarrhea.   PCP- Janna Arch Past Medical History  Diagnosis Date  . Rheumatoid arthritis(714.0)   . Chronic anemia   . Hypertension   . Arthritis, rheumatoid   . Incidental lung nodule   . Depression   . Chronic pain   . Chronic back pain    Past Surgical History  Procedure Laterality Date  . Tubal ligation    . Endometrial ablation    . Breast cyst excision  12/31/2010    Procedure: CYST EXCISION BREAST;  Surgeon: Fabio Bering, MD;  Location: [facility];  Service: General;  Laterality: Right;  Excision Sebaceous Cyst Right Breast   Family History  Problem Relation Age of Onset  . Anesthesia problems Neg Hx   . Cancer Father    History  Substance Use Topics  . Smoking status: Current Every Day Smoker -- 0.03 packs/day for 30 years    Types: Cigarettes  . Smokeless tobacco: Never Used  . Alcohol Use: No     Comment: occassional   OB History   Grav Para Term Preterm Abortions TAB SAB Ect Mult Living   6 5 5  1  1   5      Review of Systems  Gastrointestinal: Positive for abdominal pain. Negative for nausea, vomiting and  diarrhea.  Genitourinary: Positive for flank pain.  Musculoskeletal: Positive for back pain.  All other systems reviewed and are negative.  Allergies  Codeine  Home Medications   Prior to Admission medications   Medication Sig Start Date End Date Taking? Authorizing Provider  Calcium Carbonate-Vitamin D (CALCIUM 600 + D PO) Take 1 tablet by mouth every morning.     Historical Provider, MD  ferrous sulfate 325 (65 FE) MG tablet Take 325 mg by mouth 2 (two) times daily. For anemia     Historical Provider, MD  folic acid (FOLVITE) 1 MG tablet Take 1 mg by mouth daily.      Historical Provider, MD  HYDROcodone-acetaminophen (NORCO/VICODIN) 5-325 MG per tablet Take 1 tablet by mouth every 6 (six) hours as needed. 08/29/13   08/31/13, MD  hydroxychloroquine (PLAQUENIL) 200 MG tablet Take 400 mg by mouth 2 (two) times daily.    Historical Provider, MD  ibuprofen (ADVIL,MOTRIN) 800 MG tablet Take 800 mg by mouth every 8 (eight) hours as needed for headache, mild pain or moderate pain.    Historical Provider, MD  methotrexate (RHEUMATREX) 2.5 MG tablet Take 12.5 mg by mouth once a week. Monday Caution:Chemotherapy. Protect from light.    Historical Provider, MD  Multiple Vitamin (MULTIVITAMIN  WITH MINERALS) TABS Take 1 tablet by mouth daily.    Historical Provider, MD  predniSONE (DELTASONE) 10 MG tablet Take 20 mg by mouth 2 (two) times daily.     Historical Provider, MD   BP 128/89  Pulse 98  Temp(Src) 99.1 F (37.3 C) (Oral)  Resp 20  Ht 5\' 7"  (1.702 m)  Wt 158 lb (71.668 kg)  BMI 24.74 kg/m2  SpO2 97% Physical Exam  Nursing note and vitals reviewed. Constitutional: She is oriented to person, place, and time. She appears well-developed and well-nourished. No distress.  HENT:  Head: Normocephalic and atraumatic.  Eyes: Pupils are equal, round, and reactive to light.  Neck: Normal range of motion. Neck supple. No JVD present. No tracheal deviation present.  Cardiovascular: Normal  rate and regular rhythm.   No murmur heard. Pulmonary/Chest: Effort normal and breath sounds normal. She has no wheezes. She has no rales.  Abdominal: Soft. She exhibits no distension and no mass. There is tenderness.  Mildly distended. Mild left CVA and RLQ tenderness. Bladder not distended.   Musculoskeletal: Normal range of motion. She exhibits no edema.  Lymphadenopathy:    She has no cervical adenopathy.  Neurological: She is alert and oriented to person, place, and time. She has normal reflexes. No cranial nerve deficit. Coordination normal.  Skin: Skin is warm and dry. No rash noted.  Psychiatric: She has a normal mood and affect. Her behavior is normal. Thought content normal.    ED Course  Procedures (including critical care time) DIAGNOSTIC STUDIES: Oxygen Saturation is 97% on RA, normal by my interpretation.    COORDINATION OF CARE: 12:18 AM- Pt advised of plan for treatment which includes medication and labs and pt agrees.  Labs Review Results for orders placed during the hospital encounter of 09/16/13  CBC WITH DIFFERENTIAL      Result Value Ref Range   WBC 7.1  4.0 - 10.5 K/uL   RBC 4.12  3.87 - 5.11 MIL/uL   Hemoglobin 10.7 (*) 12.0 - 15.0 g/dL   HCT 11/16/13 (*) 82.9 - 56.2 %   MCV 78.2  78.0 - 100.0 fL   MCH 26.0  26.0 - 34.0 pg   MCHC 33.2  30.0 - 36.0 g/dL   RDW 13.0 (*) 86.5 - 78.4 %   Platelets 512 (*) 150 - 400 K/uL   Neutrophils Relative % 49  43 - 77 %   Neutro Abs 3.5  1.7 - 7.7 K/uL   Lymphocytes Relative 36  12 - 46 %   Lymphs Abs 2.5  0.7 - 4.0 K/uL   Monocytes Relative 8  3 - 12 %   Monocytes Absolute 0.5  0.1 - 1.0 K/uL   Eosinophils Relative 7 (*) 0 - 5 %   Eosinophils Absolute 0.5  0.0 - 0.7 K/uL   Basophils Relative 0  0 - 1 %   Basophils Absolute 0.0  0.0 - 0.1 K/uL  COMPREHENSIVE METABOLIC PANEL      Result Value Ref Range   Sodium 138  137 - 147 mEq/L   Potassium 3.3 (*) 3.7 - 5.3 mEq/L   Chloride 99  96 - 112 mEq/L   CO2 25  19 - 32  mEq/L   Glucose, Bld 92  70 - 99 mg/dL   BUN 10  6 - 23 mg/dL   Creatinine, Ser 69.6  0.50 - 1.10 mg/dL   Calcium 9.3  8.4 - 2.95 mg/dL   Total Protein 7.6  6.0 -  8.3 g/dL   Albumin 3.9  3.5 - 5.2 g/dL   AST 18  0 - 37 U/L   ALT 15  0 - 35 U/L   Alkaline Phosphatase 102  39 - 117 U/L   Total Bilirubin 0.2 (*) 0.3 - 1.2 mg/dL   GFR calc non Af Amer >90  >90 mL/min   GFR calc Af Amer >90  >90 mL/min   Anion gap 14  5 - 15  LIPASE, BLOOD      Result Value Ref Range   Lipase 29  11 - 59 U/L  URINALYSIS, ROUTINE W REFLEX MICROSCOPIC      Result Value Ref Range   Color, Urine YELLOW  YELLOW   APPearance CLEAR  CLEAR   Specific Gravity, Urine <1.005 (*) 1.005 - 1.030   pH 6.0  5.0 - 8.0   Glucose, UA NEGATIVE  NEGATIVE mg/dL   Hgb urine dipstick SMALL (*) NEGATIVE   Bilirubin Urine NEGATIVE  NEGATIVE   Ketones, ur NEGATIVE  NEGATIVE mg/dL   Protein, ur NEGATIVE  NEGATIVE mg/dL   Urobilinogen, UA 0.2  0.0 - 1.0 mg/dL   Nitrite NEGATIVE  NEGATIVE   Leukocytes, UA TRACE (*) NEGATIVE  URINE MICROSCOPIC-ADD ON      Result Value Ref Range   Squamous Epithelial / LPF RARE  RARE   WBC, UA 0-2  <3 WBC/hpf   RBC / HPF 0-2  <3 RBC/hpf   Bacteria, UA RARE  RARE  POC URINE PREG, ED      Result Value Ref Range   Preg Test, Ur NEGATIVE  NEGATIVE   MDM   Final diagnoses:  Oliguria  Left flank pain    Decreased urine output. She does not clinically have significant urinary retention. On review of old records, she had a similar presentation several months ago with no findings. Bladder scan does show 462 mL of urine present. Urinalysis is unremarkable and renal function is normal. I do not see any evidence of any significant acute process. Because of her flank and abdominal pain is unclear. She is discharged with a prescription for hydrocodone acetaminophen and is to follow up with her PCP.  I personally performed the services described in this documentation, which was scribed in my presence.  The recorded information has been reviewed and is accurate.      Monica Booze, MD 09/17/13 [phone]

## 2013-09-17 LAB — LIPASE, BLOOD: Lipase: 29 U/L (ref 11–59)

## 2013-09-17 LAB — URINE MICROSCOPIC-ADD ON

## 2013-09-17 LAB — URINALYSIS, ROUTINE W REFLEX MICROSCOPIC
Bilirubin Urine: NEGATIVE
Glucose, UA: NEGATIVE mg/dL
KETONES UR: NEGATIVE mg/dL
Nitrite: NEGATIVE
PROTEIN: NEGATIVE mg/dL
Specific Gravity, Urine: <1.005 — ABNORMAL LOW (ref 1.005–1.030)
Urobilinogen, UA: 0.2 mg/dL (ref 0.0–1.0)
pH: 6 (ref 5.0–8.0)

## 2013-09-17 LAB — COMPREHENSIVE METABOLIC PANEL
ALK PHOS: 102 U/L (ref 39–117)
ALT: 15 U/L (ref 0–35)
AST: 18 U/L (ref 0–37)
Albumin: 3.9 g/dL (ref 3.5–5.2)
Anion gap: 14 (ref 5–15)
BILIRUBIN TOTAL: 0.2 mg/dL — AB (ref 0.3–1.2)
BUN: 10 mg/dL (ref 6–23)
CHLORIDE: 99 meq/L (ref 96–112)
CO2: 25 meq/L (ref 19–32)
Calcium: 9.3 mg/dL (ref 8.4–10.5)
Creatinine, Ser: 0.55 mg/dL (ref 0.50–1.10)
GLUCOSE: 92 mg/dL (ref 70–99)
POTASSIUM: 3.3 meq/L — AB (ref 3.7–5.3)
SODIUM: 138 meq/L (ref 137–147)
Total Protein: 7.6 g/dL (ref 6.0–8.3)

## 2013-09-17 LAB — CBC WITH DIFFERENTIAL/PLATELET
Basophils Absolute: 0 10*3/uL (ref 0.0–0.1)
Basophils Relative: 0 % (ref 0–1)
EOS PCT: 7 % — AB (ref 0–5)
Eosinophils Absolute: 0.5 10*3/uL (ref 0.0–0.7)
HCT: 32.2 % — ABNORMAL LOW (ref 36.0–46.0)
Hemoglobin: 10.7 g/dL — ABNORMAL LOW (ref 12.0–15.0)
LYMPHS ABS: 2.5 10*3/uL (ref 0.7–4.0)
LYMPHS PCT: 36 % (ref 12–46)
MCH: 26 pg (ref 26.0–34.0)
MCHC: 33.2 g/dL (ref 30.0–36.0)
MCV: 78.2 fL (ref 78.0–100.0)
Monocytes Absolute: 0.5 10*3/uL (ref 0.1–1.0)
Monocytes Relative: 8 % (ref 3–12)
NEUTROS ABS: 3.5 10*3/uL (ref 1.7–7.7)
Neutrophils Relative %: 49 % (ref 43–77)
PLATELETS: 512 10*3/uL — AB (ref 150–400)
RBC: 4.12 MIL/uL (ref 3.87–5.11)
RDW: 18.1 % — ABNORMAL HIGH (ref 11.5–15.5)
WBC: 7.1 10*3/uL (ref 4.0–10.5)

## 2013-09-17 LAB — POC URINE PREG, ED: Preg Test, Ur: NEGATIVE

## 2013-09-17 MED ORDER — HYDROCODONE-ACETAMINOPHEN 5-325 MG PO TABS
1.0000 | ORAL_TABLET | Freq: Once | ORAL | Status: AC
  Administered 2013-09-17: 1 via ORAL
  Filled 2013-09-17: qty 1

## 2013-09-17 MED ORDER — SODIUM CHLORIDE 0.9 % IV SOLN
1000.0000 mL | INTRAVENOUS | Status: DC
  Administered 2013-09-17: 1000 mL via INTRAVENOUS

## 2013-09-17 MED ORDER — HYDROCODONE-ACETAMINOPHEN 5-325 MG PO TABS: 1.0000 | ORAL_TABLET | ORAL | Status: DC | PRN

## 2013-09-17 MED ORDER — SODIUM CHLORIDE 0.9 % IV SOLN
1000.0000 mL | Freq: Once | INTRAVENOUS | Status: AC
  Administered 2013-09-17: 1000 mL via INTRAVENOUS

## 2013-09-17 NOTE — ED Notes (Signed)
Pt requesting pain meds. EDP aware.

## 2013-09-17 NOTE — Discharge Instructions (Signed)
Flank Pain Flank pain refers to pain that is located on the side of the body between the upper abdomen and the back. The pain may occur over a short period of time (acute) or may be long-term or reoccurring (chronic). It may be mild or severe. Flank pain can be caused by many things. CAUSES  Some of the more common causes of flank pain include:  Muscle strains.   Muscle spasms.   A disease of your spine (vertebral disk disease).   A lung infection (pneumonia).   Fluid around your lungs (pulmonary edema).   A kidney infection.   Kidney stones.   A very painful skin rash caused by the chickenpox virus (shingles).   Gallbladder disease.  HOME CARE INSTRUCTIONS  Home care will depend on the cause of your pain. In general,  Rest as directed by your caregiver.  Drink enough fluids to keep your urine clear or pale yellow.  Only take over-the-counter or prescription medicines as directed by your caregiver. Some medicines may help relieve the pain.  Tell your caregiver about any changes in your pain.  Follow up with your caregiver as directed. SEEK IMMEDIATE MEDICAL CARE IF:   Your pain is not controlled with medicine.   You have new or worsening symptoms.  Your pain increases.   You have abdominal pain.   You have shortness of breath.   You have persistent nausea or vomiting.   You have swelling in your abdomen.   You feel faint or pass out.   You have blood in your urine.  You have a fever or persistent symptoms for more than 2-3 days.  You have a fever and your symptoms suddenly get worse. MAKE SURE YOU:   Understand these instructions.  Will watch your condition.  Will get help right away if you are not doing well or get worse. Document Released: 02/19/2005 Document Revised: 09/23/2011 Document Reviewed: 08/13/2011 [facility] Patient Information 2015 [place], [place]. This information is not intended to replace advice given to you by your  health care provider. Make sure you discuss any questions you have with your health care provider.  Acetaminophen; Hydrocodone tablets or capsules What is this medicine? ACETAMINOPHEN; HYDROCODONE (a set a MEE noe fen; hye droe KOE done) is a pain reliever. It is used to treat mild to moderate pain. This medicine may be used for other purposes; ask your health care provider or pharmacist if you have questions. COMMON BRAND NAME(S): Anexsia, Bancap HC, Ceta-Plus, Co-Gesic, Comfortpak, Dolagesic, [employer], [address], [address], Hydrogesic, Over[place]a, Lorcet HD, Lorcet Plus, Lortab, Margesic H, Maxidone, Slaughter, Polygesic, [place], Akins, Retail buyer, Vicodin, Vicodin ES, Vicodin HP, [place] Baseman What should I tell my health care provider before I take this medicine? They need to know if you have any of these conditions: -brain tumor -Crohn's disease, inflammatory bowel disease, or ulcerative colitis -drug abuse or addiction -head injury -heart or circulation problems -if you often drink alcohol -kidney disease or problems going to the bathroom -liver disease -lung disease, asthma, or breathing problems -an unusual or allergic reaction to acetaminophen, hydrocodone, other opioid analgesics, other medicines, foods, dyes, or preservatives -pregnant or trying to get pregnant -breast-feeding How should I use this medicine? Take this medicine by mouth. Swallow it with a full glass of water. Follow the directions on the prescription label. If the medicine upsets your stomach, take the medicine with food or milk. Do not take more than you are told to take. Talk to your pediatrician regarding the use  of this medicine in children. This medicine is not approved for use in children. Overdosage: If you think you have taken too much of this medicine contact a poison control center or emergency room at once. NOTE: This medicine is only for you. Do not share this medicine with others. What if I miss a  dose? If you miss a dose, take it as soon as you can. If it is almost time for your next dose, take only that dose. Do not take double or extra doses. What may interact with this medicine? -alcohol -antihistamines -isoniazid -medicines for depression, anxiety, or psychotic disturbances -medicines for sleep -muscle relaxants -naltrexone -narcotic medicines (opiates) for pain -phenobarbital -ritonavir -tramadol This list may not describe all possible interactions. Give your health care provider a list of all the medicines, herbs, non-prescription drugs, or dietary supplements you use. Also tell them if you smoke, drink alcohol, or use illegal drugs. Some items may interact with your medicine. What should I watch for while using this medicine? Tell your doctor or health care professional if your pain does not go away, if it gets worse, or if you have new or a different type of pain. You may develop tolerance to the medicine. Tolerance means that you will need a higher dose of the medicine for pain relief. Tolerance is normal and is expected if you take the medicine for a long time. Do not suddenly stop taking your medicine because you may develop a severe reaction. Your body becomes used to the medicine. This does NOT mean you are addicted. Addiction is a behavior related to getting and using a drug for a non-medical reason. If you have pain, you have a medical reason to take pain medicine. Your doctor will tell you how much medicine to take. If your doctor wants you to stop the medicine, the dose will be slowly lowered over time to avoid any side effects. You may get drowsy or dizzy when you first start taking the medicine or change doses. Do not drive, use machinery, or do anything that may be dangerous until you know how the medicine affects you. Stand or sit up slowly. There are different types of narcotic medicines (opiates) for pain. If you take more than one type at the same time, you may have  more side effects. Give your health care provider a list of all medicines you use. Your doctor will tell you how much medicine to take. Do not take more medicine than directed. Call emergency for help if you have problems breathing. The medicine will cause constipation. Try to have a bowel movement at least every 2 to 3 days. If you do not have a bowel movement for 3 days, call your doctor or health care professional. Too much acetaminophen can be very dangerous. Do not take Tylenol (acetaminophen) or medicines that contain acetaminophen with this medicine. Many non-prescription medicines contain acetaminophen. Always read the labels carefully. What side effects may I notice from receiving this medicine? Side effects that you should report to your doctor or health care professional as soon as possible: -allergic reactions like skin rash, itching or hives, swelling of the face, lips, or tongue -breathing problems -confusion -feeling faint or lightheaded, falls -stomach pain -yellowing of the eyes or skin Side effects that usually do not require medical attention (report to your doctor or health care professional if they continue or are bothersome): -nausea, vomiting -stomach upset This list may not describe all possible side effects. Call your doctor for medical  advice about side effects. You may report side effects to FDA at 1-800-FDA-1088. Where should I keep my medicine? Keep out of the reach of children. This medicine can be abused. Keep your medicine in a safe place to protect it from theft. Do not share this medicine with anyone. Selling or giving away this medicine is dangerous and against the law. Store at room temperature between 15 and 30 degrees C (59 and 86 degrees F). Protect from light. Keep container tightly closed. Throw away any unused medicine after the expiration date. Discard unused medicine and used packaging carefully. Pets and children can be harmed if they find used or lost  packages. NOTE: This sheet is a summary. It may not cover all possible information. If you have questions about this medicine, talk to your doctor, pharmacist, or health care provider.  2015, Elsevier/Gold Standard. (2012-08-22 13:15:56)

## 2013-09-27 ENCOUNTER — Emergency Department (HOSPITAL_COMMUNITY)
Admission: EM | Admit: 2013-09-27 | Discharge: 2013-09-27 | Disposition: A | Discharge status: Home / Self Care | Payer: Self-pay | Attending: Emergency Medicine | Admitting: Emergency Medicine

## 2013-09-27 ENCOUNTER — Encounter (HOSPITAL_COMMUNITY): Payer: Self-pay | Admitting: Emergency Medicine

## 2013-09-27 DIAGNOSIS — L02411 Cutaneous abscess of right axilla: Secondary | ICD-10-CM

## 2013-09-27 DIAGNOSIS — I10 Essential (primary) hypertension: Secondary | ICD-10-CM | POA: Insufficient documentation

## 2013-09-27 DIAGNOSIS — G8929 Other chronic pain: Secondary | ICD-10-CM | POA: Insufficient documentation

## 2013-09-27 DIAGNOSIS — IMO0002 Reserved for concepts with insufficient information to code with codable children: Secondary | ICD-10-CM | POA: Insufficient documentation

## 2013-09-27 DIAGNOSIS — M069 Rheumatoid arthritis, unspecified: Secondary | ICD-10-CM | POA: Insufficient documentation

## 2013-09-27 DIAGNOSIS — Z792 Long term (current) use of antibiotics: Secondary | ICD-10-CM | POA: Insufficient documentation

## 2013-09-27 DIAGNOSIS — Z79899 Other long term (current) drug therapy: Secondary | ICD-10-CM | POA: Insufficient documentation

## 2013-09-27 DIAGNOSIS — Z8659 Personal history of other mental and behavioral disorders: Secondary | ICD-10-CM | POA: Insufficient documentation

## 2013-09-27 DIAGNOSIS — D649 Anemia, unspecified: Secondary | ICD-10-CM | POA: Insufficient documentation

## 2013-09-27 MED ORDER — SULFAMETHOXAZOLE-TRIMETHOPRIM 800-160 MG PO TABS: 1.0000 | ORAL_TABLET | Freq: Two times a day (BID) | ORAL | Status: AC

## 2013-09-27 MED ORDER — LIDOCAINE HCL (PF) 1 % IJ SOLN
INTRAMUSCULAR | Status: AC
  Administered 2013-09-27: 17:00:00
  Filled 2013-09-27: qty 5

## 2013-09-27 MED ORDER — HYDROCODONE-ACETAMINOPHEN 5-325 MG PO TABS: 1.0000 | ORAL_TABLET | ORAL | Status: DC | PRN

## 2013-09-27 MED ORDER — OXYCODONE-ACETAMINOPHEN 5-325 MG PO TABS
2.0000 | ORAL_TABLET | Freq: Once | ORAL | Status: AC
  Administered 2013-09-27: 2 via ORAL
  Filled 2013-09-27: qty 2

## 2013-09-27 NOTE — ED Notes (Signed)
Abscess to right axilla x 1 wk.

## 2013-09-27 NOTE — ED Notes (Signed)
Pt verbalized understanding of no driving within 4 hours of taking pain meds due to meds cause drowsiness  

## 2013-09-27 NOTE — Discharge Instructions (Signed)
Take antibiotic to completion. Take Vicodin for severe pain only. No driving or operating heavy machinery while taking vicodin. This medication may cause drowsiness. Apply warm compresses throughout the day. Change dressing twice daily.  Abscess An abscess is an infected area that contains a collection of pus and debris.It can occur in almost any part of the body. An abscess is also known as a furuncle or boil. CAUSES  An abscess occurs when tissue gets infected. This can occur from blockage of oil or sweat glands, infection of hair follicles, or a minor injury to the skin. As the body tries to fight the infection, pus collects in the area and creates pressure under the skin. This pressure causes pain. People with weakened immune systems have difficulty fighting infections and get certain abscesses more often.  SYMPTOMS Usually an abscess develops on the skin and becomes a painful mass that is red, warm, and tender. If the abscess forms under the skin, you may feel a moveable soft area under the skin. Some abscesses break open (rupture) on their own, but most will continue to get worse without care. The infection can spread deeper into the body and eventually into the bloodstream, causing you to feel ill.  DIAGNOSIS  Your caregiver will take your medical history and perform a physical exam. A sample of fluid may also be taken from the abscess to determine what is causing your infection. TREATMENT  Your caregiver may prescribe antibiotic medicines to fight the infection. However, taking antibiotics alone usually does not cure an abscess. Your caregiver may need to make a small cut (incision) in the abscess to drain the pus. In some cases, gauze is packed into the abscess to reduce pain and to continue draining the area. HOME CARE INSTRUCTIONS   Only take over-the-counter or prescription medicines for pain, discomfort, or fever as directed by your caregiver.  If you were prescribed antibiotics, take  them as directed. Finish them even if you start to feel better.  If gauze is used, follow your caregiver's directions for changing the gauze.  To avoid spreading the infection:  Keep your draining abscess covered with a bandage.  Wash your hands well.  Do not share personal care items, towels, or whirlpools with others.  Avoid skin contact with others.  Keep your skin and clothes clean around the abscess.  Keep all follow-up appointments as directed by your caregiver. SEEK MEDICAL CARE IF:   You have increased pain, swelling, redness, fluid drainage, or bleeding.  You have muscle aches, chills, or a general ill feeling.  You have a fever. MAKE SURE YOU:   Understand these instructions.  Will watch your condition.  Will get help right away if you are not doing well or get worse. Document Released: 10/08/2004 Document Revised: 06/30/2011 Document Reviewed: 03/13/2011 [facility] Patient Information 2015 [place], [place]. This information is not intended to replace advice given to you by your health care provider. Make sure you discuss any questions you have with your health care provider.  Abscess Care After An abscess (also called a boil or furuncle) is an infected area that contains a collection of pus. Signs and symptoms of an abscess include pain, tenderness, redness, or hardness, or you may feel a moveable soft area under your skin. An abscess can occur anywhere in the body. The infection may spread to surrounding tissues causing cellulitis. A cut (incision) by the surgeon was made over your abscess and the pus was drained out. Gauze may have been packed into  the space to provide a drain that will allow the cavity to heal from the inside outwards. The boil may be painful for 5 to 7 days. Most people with a boil do not have high fevers. Your abscess, if seen early, may not have localized, and may not have been lanced. If not, another appointment may be required for this if it does  not get better on its own or with medications. HOME CARE INSTRUCTIONS   Only take over-the-counter or prescription medicines for pain, discomfort, or fever as directed by your caregiver.  When you bathe, soak and then remove gauze or iodoform packs at least daily or as directed by your caregiver. You may then wash the wound gently with mild soapy water. Repack with gauze or do as your caregiver directs. SEEK IMMEDIATE MEDICAL CARE IF:   You develop increased pain, swelling, redness, drainage, or bleeding in the wound site.  You develop signs of generalized infection including muscle aches, chills, fever, or a general ill feeling.  An oral temperature above 102 F (38.9 C) develops, not controlled by medication. See your caregiver for a recheck if you develop any of the symptoms described above. If medications (antibiotics) were prescribed, take them as directed. Document Released: 07/17/2004 Document Revised: 03/23/2011 Document Reviewed: 03/14/2007 [facility] Patient Information 2015 [place], [place]. This information is not intended to replace advice given to you by your health care provider. Make sure you discuss any questions you have with your health care provider.

## 2013-09-27 NOTE — ED Notes (Signed)
Pt reports abscess to right axilla x 4 days. Has been applying warm compress with no relief.

## 2013-09-27 NOTE — ED Provider Notes (Signed)
CSN: [id number]     Arrival date & time 09/27/13  1548 History   First MD Initiated Contact with Patient 09/27/13 1709     Chief Complaint  Patient presents with  . Abscess     (Consider location/radiation/quality/duration/timing/severity/associated sxs/prior Treatment) HPI Comments: Patient is a 48 y/o female with a past medical history of rheumatoid arthritis, chronic anemia, hypertension, depression and chronic pain who presents to the emergency department with an abscess under her right axilla x1 week, worsening over the past 2-3 days. She reports she had an abscess under her left axilla which she was able to "pop" with a warm compress to make it go away. She reports she's been applying warm compresses to her right armpit, however it is not going away. Pain increases with any pressure to the area. States there were 2 smaller ones in the area that didn't pop. Her daughter recently had a similar abscess and was told she had a staph infection. States she's had intermittent subjective fevers and chills over the past week. She called her PCP today who advised her to go to the emergency department.  Patient is a 48 y.o. female presenting with abscess. The history is provided by the patient.  Abscess Associated symptoms: fever     Past Medical History  Diagnosis Date  . Rheumatoid arthritis(714.0)   . Chronic anemia   . Hypertension   . Arthritis, rheumatoid   . Incidental lung nodule   . Depression   . Chronic pain   . Chronic back pain    Past Surgical History  Procedure Laterality Date  . Tubal ligation    . Endometrial ablation    . Breast cyst excision  12/31/2010    Procedure: CYST EXCISION BREAST;  Surgeon: Fabio Bering, MD;  Location: [facility];  Service: General;  Laterality: Right;  Excision Sebaceous Cyst Right Breast   Family History  Problem Relation Age of Onset  . Anesthesia problems Neg Hx   . Cancer Father    History  Substance Use Topics  . Smoking status:  Current Every Day Smoker -- 0.03 packs/day for 30 years    Types: Cigarettes  . Smokeless tobacco: Never Used  . Alcohol Use: No     Comment: occassional   OB History   Grav Para Term Preterm Abortions TAB SAB Ect Mult Living   6 5 5  1  1   5      Review of Systems  Constitutional: Positive for fever and chills.  Skin:       + abscess to right axilla.  All other systems reviewed and are negative.     Allergies  Codeine  Home Medications   Prior to Admission medications   Medication Sig Start Date End Date Taking? Authorizing Provider  Calcium Carbonate-Vitamin D (CALCIUM 600 + D PO) Take 1 tablet by mouth every morning.     Historical Provider, MD  ferrous sulfate 325 (65 FE) MG tablet Take 325 mg by mouth 2 (two) times daily. For anemia     Historical Provider, MD  folic acid (FOLVITE) 1 MG tablet Take 1 mg by mouth daily.      Historical Provider, MD  HYDROcodone-acetaminophen (NORCO) 5-325 MG per tablet Take 1 tablet by mouth every 4 (four) hours as needed for moderate pain. 09/17/13   11/17/13, MD  HYDROcodone-acetaminophen (NORCO/VICODIN) 5-325 MG per tablet Take 1 tablet by mouth every 6 (six) hours as needed. 08/29/13   08/31/13, MD  HYDROcodone-acetaminophen (NORCO/VICODIN) 5-325 MG per tablet Take 1-2 tablets by mouth every 4 (four) hours as needed. 09/27/13   Trevor Mace, PA-C  hydroxychloroquine (PLAQUENIL) 200 MG tablet Take 400 mg by mouth 2 (two) times daily.    Historical Provider, MD  ibuprofen (ADVIL,MOTRIN) 800 MG tablet Take 800 mg by mouth every 8 (eight) hours as needed for headache, mild pain or moderate pain.    Historical Provider, MD  methotrexate (RHEUMATREX) 2.5 MG tablet Take 12.5 mg by mouth once a week. Monday Caution:Chemotherapy. Protect from light.    Historical Provider, MD  Multiple Vitamin (MULTIVITAMIN WITH MINERALS) TABS Take 1 tablet by mouth daily.    Historical Provider, MD  predniSONE (DELTASONE) 10 MG tablet Take 20 mg by mouth  2 (two) times daily.     Historical Provider, MD  sulfamethoxazole-trimethoprim (BACTRIM DS,SEPTRA DS) 800-160 MG per tablet Take 1 tablet by mouth 2 (two) times daily. 09/27/13 10/04/13  Nada Boozer Albert, PA-C   BP 132/98  Pulse 97  Temp(Src) 99 F (37.2 C) (Oral)  Resp 16  Ht 5\' 9"  (1.753 m)  Wt 158 lb (71.668 kg)  BMI 23.32 kg/m2  SpO2 100% Physical Exam  Nursing note and vitals reviewed. Constitutional: She is oriented to person, place, and time. She appears well-developed and well-nourished. No distress.  HENT:  Head: Normocephalic and atraumatic.  Mouth/Throat: Oropharynx is clear and moist.  Eyes: Conjunctivae and EOM are normal.  Neck: Normal range of motion. Neck supple.  Cardiovascular: Normal rate, regular rhythm and normal heart sounds.   Pulmonary/Chest: Effort normal and breath sounds normal. No respiratory distress.  Musculoskeletal: Normal range of motion. She exhibits no edema.  Neurological: She is alert and oriented to person, place, and time. No sensory deficit.  Skin: Skin is warm and dry.  2 cm area of fluctuance under right axilla. Tender. No drainage. No surrounding cellulitis. Smaller 1 cm area of induration lateral to larger area, no fluctuance or drainage. 5 mm surrounding erythema.  Psychiatric: She has a normal mood and affect. Her behavior is normal.    ED Course  Procedures (including critical care time) INCISION AND DRAINAGE Performed by: Consent: Verbal consent obtained. Risks and benefits: risks, benefits and alternatives were discussed Type: abscess  Body area: right axilla  Anesthesia: local infiltration  Incision was made with a scalpel.  Local anesthetic: lidocaine 1% without epinephrine  Anesthetic total: 2 ml  Complexity: complex Blunt dissection to break up loculations  Drainage: purulent  Drainage amount: large  Packing material: none  Patient tolerance: Patient tolerated the procedure well with no immediate  complications.    Labs Review Labs Reviewed - No data to display  Imaging Review No results found.   EKG Interpretation None      MDM   Final diagnoses:  Abscess of right axilla   Patient presenting with abscess. Afebrile, vital signs stable. She appears in no apparent distress. Larger abscess drained. Smaller abscess unable to be drained. Advised warm compresses. Will discharge with Bactrim and pain medication. Followup with PCP in 2-3 days for recheck. Stable for discharge. Return precautions given. Patient states understanding of treatment care plan and is agreeable.   Johnnette Gourd, PA-C 09/27/13 1744

## 2013-09-28 NOTE — ED Provider Notes (Signed)
Medical screening examination/treatment/procedure(s) were performed by non-physician practitioner and as supervising physician I was immediately available for consultation/collaboration.   EKG Interpretation None        Vanetta Mulders, MD 09/28/13 [phone]

## 2013-09-29 ENCOUNTER — Emergency Department (HOSPITAL_COMMUNITY)
Admission: EM | Admit: 2013-09-29 | Discharge: 2013-09-29 | Disposition: A | Discharge status: Home / Self Care | Payer: Self-pay | Attending: Emergency Medicine | Admitting: Emergency Medicine

## 2013-09-29 ENCOUNTER — Encounter (HOSPITAL_COMMUNITY): Payer: Self-pay | Admitting: Emergency Medicine

## 2013-09-29 DIAGNOSIS — M549 Dorsalgia, unspecified: Secondary | ICD-10-CM | POA: Insufficient documentation

## 2013-09-29 DIAGNOSIS — D649 Anemia, unspecified: Secondary | ICD-10-CM | POA: Insufficient documentation

## 2013-09-29 DIAGNOSIS — IMO0002 Reserved for concepts with insufficient information to code with codable children: Secondary | ICD-10-CM | POA: Insufficient documentation

## 2013-09-29 DIAGNOSIS — F3289 Other specified depressive episodes: Secondary | ICD-10-CM | POA: Insufficient documentation

## 2013-09-29 DIAGNOSIS — Z79899 Other long term (current) drug therapy: Secondary | ICD-10-CM | POA: Insufficient documentation

## 2013-09-29 DIAGNOSIS — F17200 Nicotine dependence, unspecified, uncomplicated: Secondary | ICD-10-CM | POA: Insufficient documentation

## 2013-09-29 DIAGNOSIS — Z792 Long term (current) use of antibiotics: Secondary | ICD-10-CM | POA: Insufficient documentation

## 2013-09-29 DIAGNOSIS — G8929 Other chronic pain: Secondary | ICD-10-CM | POA: Insufficient documentation

## 2013-09-29 DIAGNOSIS — M069 Rheumatoid arthritis, unspecified: Secondary | ICD-10-CM | POA: Insufficient documentation

## 2013-09-29 DIAGNOSIS — F329 Major depressive disorder, single episode, unspecified: Secondary | ICD-10-CM | POA: Insufficient documentation

## 2013-09-29 DIAGNOSIS — L732 Hidradenitis suppurativa: Secondary | ICD-10-CM | POA: Insufficient documentation

## 2013-09-29 MED ORDER — KETOROLAC TROMETHAMINE 10 MG PO TABS
10.0000 mg | ORAL_TABLET | Freq: Once | ORAL | Status: AC
  Administered 2013-09-29: 10 mg via ORAL
  Filled 2013-09-29: qty 1

## 2013-09-29 MED ORDER — HYDROCODONE-ACETAMINOPHEN 5-325 MG PO TABS
2.0000 | ORAL_TABLET | Freq: Once | ORAL | Status: AC
  Administered 2013-09-29: 2 via ORAL
  Filled 2013-09-29: qty 2

## 2013-09-29 MED ORDER — HYDROCODONE-ACETAMINOPHEN 5-325 MG PO TABS: 1.0000 | ORAL_TABLET | ORAL | Status: DC | PRN

## 2013-09-29 MED ORDER — CEFTRIAXONE SODIUM 1 G IJ SOLR
1.0000 g | Freq: Once | INTRAMUSCULAR | Status: AC
  Administered 2013-09-29: 1 g via INTRAMUSCULAR
  Filled 2013-09-29: qty 10

## 2013-09-29 MED ORDER — LIDOCAINE HCL (PF) 1 % IJ SOLN
INTRAMUSCULAR | Status: AC
  Administered 2013-09-29: 5 mL
  Filled 2013-09-29: qty 5

## 2013-09-29 NOTE — ED Notes (Addendum)
Pt reports has right axilla abscess that was I&D on Wednesday and was told to come back for follow-up in a few days. Pt reports increased pain. nad noted.

## 2013-09-29 NOTE — ED Provider Notes (Signed)
CSN: [id number]     Arrival date & time 09/29/13  1338 History   First MD Initiated Contact with Patient 09/29/13 1506     Chief Complaint  Patient presents with  . Abscess     (Consider location/radiation/quality/duration/timing/severity/associated sxs/prior Treatment) HPI Comments: Patient is a 48 year old female who presents to the emergency department with complaint of abscess of the right under arm. The patient states that this has been going on for nearly a week. She was seen in the emergency apartment on September 16 at which time she had an incision and drainage of this area. She now states that the area is are quite sore that she had drained, she has at least 2 of the new satellite areas that are very sore to touch. She has not had fever. She states she does not" feel right". She has not had any unusual rash, there has been no diarrhea. She has not noted red streaks from under the arm or into the breast area. She has tried warm compresses to the area, 2 days of Bactrim, and has been doubling up on her Norco. Patient states she is still having excruciating pain.  The history is provided by the patient.    Past Medical History  Diagnosis Date  . Rheumatoid arthritis(714.0)   . Chronic anemia   . Hypertension   . Arthritis, rheumatoid   . Incidental lung nodule   . Depression   . Chronic pain   . Chronic back pain    Past Surgical History  Procedure Laterality Date  . Tubal ligation    . Endometrial ablation    . Breast cyst excision  12/31/2010    Procedure: CYST EXCISION BREAST;  Surgeon: Fabio Bering, MD;  Location: [facility];  Service: General;  Laterality: Right;  Excision Sebaceous Cyst Right Breast   Family History  Problem Relation Age of Onset  . Anesthesia problems Neg Hx   . Cancer Father    History  Substance Use Topics  . Smoking status: Current Every Day Smoker -- 0.03 packs/day for 30 years    Types: Cigarettes  . Smokeless tobacco: Never Used  .  Alcohol Use: No     Comment: occassional   OB History   Grav Para Term Preterm Abortions TAB SAB Ect Mult Living   6 5 5  1  1   5      Review of Systems  Constitutional: Negative for activity change.       All ROS Neg except as noted in HPI  Eyes: Negative for photophobia and discharge.  Respiratory: Negative for cough, shortness of breath and wheezing.   Cardiovascular: Negative for chest pain and palpitations.  Gastrointestinal: Negative for abdominal pain and blood in stool.  Genitourinary: Negative for dysuria, frequency and hematuria.  Musculoskeletal: Positive for back pain. Negative for arthralgias and neck pain.  Skin: Positive for wound.  Neurological: Negative for dizziness, seizures and speech difficulty.  Psychiatric/Behavioral: Negative for hallucinations and confusion.       Depression      Allergies  Codeine  Home Medications   Prior to Admission medications   Medication Sig Start Date End Date Taking? Authorizing Provider  Calcium Carbonate-Vitamin D (CALCIUM 600 + D PO) Take 1 tablet by mouth every morning.    Yes Historical Provider, MD  ferrous sulfate 325 (65 FE) MG tablet Take 325 mg by mouth 2 (two) times daily. For anemia    Yes Historical Provider, MD  folic acid (FOLVITE) 1  MG tablet Take 1 mg by mouth daily.     Yes Historical Provider, MD  hydroxychloroquine (PLAQUENIL) 200 MG tablet Take 400 mg by mouth 2 (two) times daily.   Yes Historical Provider, MD  ibuprofen (ADVIL,MOTRIN) 800 MG tablet Take 800 mg by mouth every 8 (eight) hours as needed for headache, mild pain or moderate pain.   Yes Historical Provider, MD  methotrexate (RHEUMATREX) 2.5 MG tablet Take 12.5 mg by mouth 2 (two) times a week. Sundays and Mondays: Caution:Chemotherapy. Protect from light.   Yes Historical Provider, MD  Multiple Vitamin (MULTIVITAMIN WITH MINERALS) TABS Take 1 tablet by mouth daily.   Yes Historical Provider, MD  predniSONE (DELTASONE) 10 MG tablet Take 10 mg by  mouth 2 (two) times daily.    Yes Historical Provider, MD  sulfamethoxazole-trimethoprim (BACTRIM DS,SEPTRA DS) 800-160 MG per tablet Take 1 tablet by mouth 2 (two) times daily. 09/27/13 10/04/13 Yes Robyn Judeth Cornfield, PA-C  HYDROcodone-acetaminophen (NORCO/VICODIN) 5-325 MG per tablet Take 1 tablet by mouth every 6 (six) hours as needed. 08/29/13   Benny Lennert, MD   BP 125/87  Pulse 86  Temp(Src) 98.4 F (36.9 C) (Oral)  Resp 18  Ht 5\' 6"  (1.676 m)  Wt 158 lb (71.668 kg)  BMI 25.51 kg/m2  SpO2 100% Physical Exam  Nursing note and vitals reviewed. Constitutional: She is oriented to person, place, and time. She appears well-developed and well-nourished.  Non-toxic appearance.  HENT:  Head: Normocephalic.  Right Ear: Tympanic membrane and external ear normal.  Left Ear: Tympanic membrane and external ear normal.  Eyes: EOM and lids are normal. Pupils are equal, round, and reactive to light.  Neck: Normal range of motion. Neck supple. Carotid bruit is not present.  Cardiovascular: Normal rate, regular rhythm, normal heart sounds, intact distal pulses and normal pulses.   Pulmonary/Chest: Breath sounds normal. No respiratory distress.  Abdominal: Soft. Bowel sounds are normal. There is no tenderness. There is no guarding.  Musculoskeletal: Normal range of motion.  There is increased pain and tenderness of the right axilla. There is mild to moderate increased redness present. There are 3 raised red fluctuant areas that are tender to touch. There is no red streaking of the axilla. One of the red raised areas is draining. At pus like material and is the site of her previous incision and drainage.  Lymphadenopathy:       Head (right side): No submandibular adenopathy present.       Head (left side): No submandibular adenopathy present.    She has no cervical adenopathy.  Neurological: She is alert and oriented to person, place, and time. She has normal strength. No cranial nerve deficit or  sensory deficit.  Skin: Skin is warm and dry.  Psychiatric: She has a normal mood and affect. Her speech is normal.    ED Course  Procedures (including critical care time) Labs Review Labs Reviewed - No data to display  Imaging Review No results found.   EKG Interpretation None      MDM  The vital signs are well within normal limits. Pulse oximetry is 100% on room air. The patient has 3 red fluctuant type areas in the right axilla. There is one small red raised area that is tender and nonfluctuant also in the right axilla. A culture of the drainage was sent to the lab.  I discussed with patient that she may need more than 2 days of antibiotics before seeing results. I've also suggested to  the patient that with many areas under the axilla area that she may need surgical intervention instead of just incision and drainage here in the emergency department. Pt referred to Dr Lovell Sheehan.  The patient was given 1 g of Rocephin here in the emergency department to assist or an accident the Bactrim and she is currently taking. Will ask patient to continue the plaquenil and Motrin. Prescription for Norco, one or 2 tablets every 4 hours as needed for pain given to the patient.    Final diagnoses:  None    *I have reviewed nursing notes, vital signs, and all appropriate lab and imaging results for this patient.Kathie Dike, PA-C 09/29/13 [phone]

## 2013-09-29 NOTE — Discharge Instructions (Signed)
Please soak in a tub of warm Epsom salt water for 15-20 minutes daily. Please change bandage to your right under arm daily. Please continue your rheumatoid arthritis medication. Please increase your ibuprofen to 3 times daily with food for 7 days. Please use 1 or 2 Norco tablets every 4 hours as needed for pain. This medication may cause drowsiness, please use with caution. Please see Dr. Delbert Harness next week for recheck. Hidradenitis Suppurativa, Sweat Gland Abscess Hidradenitis suppurativa is a long lasting (chronic), uncommon disease of the sweat glands. With this, boil-like lumps and scarring develop in the groin, some times under the arms (axillae), and under the breasts. It may also uncommonly occur behind the ears, in the crease of the buttocks, and around the genitals.  CAUSES  The cause is from a blocking of the sweat glands. They then become infected. It may cause drainage and odor. It is not contagious. So it cannot be given to someone else. It most often shows up in puberty (about 32 to 46 years of age). But it may happen much later. It is similar to acne which is a disease of the sweat glands. This condition is slightly more common in African-Americans and women. SYMPTOMS   Hidradenitis usually starts as one or more red, tender, swellings in the groin or under the arms (axilla).  Over a period of hours to days the lesions get larger. They often open to the skin surface, draining clear to yellow-colored fluid.  The infected area heals with scarring. DIAGNOSIS  Your caregiver makes this diagnosis by looking at you. Sometimes cultures (growing germs on plates in the lab) may be taken. This is to see what germ (bacterium) is causing the infection.  TREATMENT   Topical germ killing medicine applied to the skin (antibiotics) are the treatment of choice. Antibiotics taken by mouth (systemic) are sometimes needed when the condition is getting worse or is severe.  Avoid tight-fitting clothing  which traps moisture in.  Dirt does not cause hidradenitis and it is not caused by poor hygiene.  Involved areas should be cleaned daily using an antibacterial soap. Some patients find that the liquid form of Lever 2000, applied to the involved areas as a lotion after bathing, can help reduce the odor related to this condition.  Sometimes surgery is needed to drain infected areas or remove scarred tissue. Removal of large amounts of tissue is used only in severe cases.  Birth control pills may be helpful.  Oral retinoids (vitamin A derivatives) for 6 to 12 months which are effective for acne may also help this condition.  Weight loss will improve but not cure hidradenitis. It is made worse by being overweight. But the condition is not caused by being overweight.  This condition is more common in people who have had acne.  It may become worse under stress. There is no medical cure for hidradenitis. It can be controlled, but not cured. The condition usually continues for years with periods of getting worse and getting better (remission). Document Released: 08/13/2003 Document Revised: 03/23/2011 Document Reviewed: 03/31/2013 [facility] Patient Information 2015 [place], [place]. This information is not intended to replace advice given to you by your health care provider. Make sure you discuss any questions you have with your health care provider.

## 2013-09-29 NOTE — ED Notes (Signed)
Axilla cleansed , dressed.

## 2013-10-02 ENCOUNTER — Telehealth (HOSPITAL_BASED_OUTPATIENT_CLINIC_OR_DEPARTMENT_OTHER): Payer: Self-pay | Admitting: Emergency Medicine

## 2013-10-02 ENCOUNTER — Emergency Department (HOSPITAL_COMMUNITY)
Admission: EM | Admit: 2013-10-02 | Discharge: 2013-10-02 | Disposition: A | Discharge status: Home / Self Care | Payer: Self-pay | Attending: Emergency Medicine | Admitting: Emergency Medicine

## 2013-10-02 ENCOUNTER — Encounter (HOSPITAL_COMMUNITY): Payer: Self-pay | Admitting: Emergency Medicine

## 2013-10-02 DIAGNOSIS — Z79899 Other long term (current) drug therapy: Secondary | ICD-10-CM | POA: Insufficient documentation

## 2013-10-02 DIAGNOSIS — A4902 Methicillin resistant Staphylococcus aureus infection, unspecified site: Secondary | ICD-10-CM | POA: Insufficient documentation

## 2013-10-02 DIAGNOSIS — L732 Hidradenitis suppurativa: Secondary | ICD-10-CM | POA: Insufficient documentation

## 2013-10-02 DIAGNOSIS — Z8659 Personal history of other mental and behavioral disorders: Secondary | ICD-10-CM | POA: Insufficient documentation

## 2013-10-02 DIAGNOSIS — M069 Rheumatoid arthritis, unspecified: Secondary | ICD-10-CM | POA: Insufficient documentation

## 2013-10-02 DIAGNOSIS — D649 Anemia, unspecified: Secondary | ICD-10-CM | POA: Insufficient documentation

## 2013-10-02 DIAGNOSIS — I10 Essential (primary) hypertension: Secondary | ICD-10-CM | POA: Insufficient documentation

## 2013-10-02 DIAGNOSIS — G8929 Other chronic pain: Secondary | ICD-10-CM | POA: Insufficient documentation

## 2013-10-02 DIAGNOSIS — F17200 Nicotine dependence, unspecified, uncomplicated: Secondary | ICD-10-CM | POA: Insufficient documentation

## 2013-10-02 DIAGNOSIS — Z792 Long term (current) use of antibiotics: Secondary | ICD-10-CM | POA: Insufficient documentation

## 2013-10-02 DIAGNOSIS — IMO0002 Reserved for concepts with insufficient information to code with codable children: Secondary | ICD-10-CM | POA: Insufficient documentation

## 2013-10-02 LAB — CULTURE, ROUTINE-ABSCESS: SPECIAL REQUESTS: NORMAL

## 2013-10-02 LAB — BASIC METABOLIC PANEL
Anion gap: 11 (ref 5–15)
BUN: 5 mg/dL — ABNORMAL LOW (ref 6–23)
CALCIUM: 9.1 mg/dL (ref 8.4–10.5)
CO2: 28 mEq/L (ref 19–32)
Chloride: 103 mEq/L (ref 96–112)
Creatinine, Ser: 0.59 mg/dL (ref 0.50–1.10)
GFR calc Af Amer: >90 mL/min (ref >90)
GFR calc non Af Amer: >90 mL/min (ref >90)
GLUCOSE: 89 mg/dL (ref 70–99)
Potassium: 3.5 mEq/L — ABNORMAL LOW (ref 3.7–5.3)
SODIUM: 142 meq/L (ref 137–147)

## 2013-10-02 LAB — CBC WITH DIFFERENTIAL/PLATELET
Basophils Absolute: 0 10*3/uL (ref 0.0–0.1)
Basophils Relative: 0 % (ref 0–1)
EOS ABS: 0.3 10*3/uL (ref 0.0–0.7)
EOS PCT: 4 % (ref 0–5)
HCT: 32.5 % — ABNORMAL LOW (ref 36.0–46.0)
Hemoglobin: 10.6 g/dL — ABNORMAL LOW (ref 12.0–15.0)
Lymphocytes Relative: 28 % (ref 12–46)
Lymphs Abs: 2.2 10*3/uL (ref 0.7–4.0)
MCH: 26.5 pg (ref 26.0–34.0)
MCHC: 32.6 g/dL (ref 30.0–36.0)
MCV: 81.3 fL (ref 78.0–100.0)
MONOS PCT: 6 % (ref 3–12)
Monocytes Absolute: 0.5 10*3/uL (ref 0.1–1.0)
Neutro Abs: 5 10*3/uL (ref 1.7–7.7)
Neutrophils Relative %: 62 % (ref 43–77)
PLATELETS: 517 10*3/uL — AB (ref 150–400)
RBC: 4 MIL/uL (ref 3.87–5.11)
RDW: 19.7 % — ABNORMAL HIGH (ref 11.5–15.5)
WBC: 8 10*3/uL (ref 4.0–10.5)

## 2013-10-02 MED ORDER — SULFAMETHOXAZOLE-TRIMETHOPRIM 800-160 MG PO TABS: 1.0000 | ORAL_TABLET | Freq: Two times a day (BID) | ORAL | Status: DC

## 2013-10-02 MED ORDER — OXYCODONE-ACETAMINOPHEN 5-325 MG PO TABS
1.0000 | ORAL_TABLET | Freq: Once | ORAL | Status: AC
  Administered 2013-10-02: 1 via ORAL
  Filled 2013-10-02: qty 1

## 2013-10-02 MED ORDER — HYDROCODONE-ACETAMINOPHEN 5-325 MG PO TABS: 1.0000 | ORAL_TABLET | ORAL | Status: DC | PRN

## 2013-10-02 NOTE — ED Provider Notes (Signed)
Medical screening examination/treatment/procedure(s) were performed by non-physician practitioner and as supervising physician I was immediately available for consultation/collaboration.   EKG Interpretation None       Donnetta Hutching, MD 10/02/13 [phone]

## 2013-10-02 NOTE — Discharge Instructions (Signed)
Hidradenitis Suppurativa, Sweat Gland Abscess Hidradenitis suppurativa is a long lasting (chronic), uncommon disease of the sweat glands. With this, boil-like lumps and scarring develop in the groin, some times under the arms (axillae), and under the breasts. It may also uncommonly occur behind the ears, in the crease of the buttocks, and around the genitals.  CAUSES  The cause is from a blocking of the sweat glands. They then become infected. It may cause drainage and odor. It is not contagious. So it cannot be given to someone else. It most often shows up in puberty (about 46 to 46 years of age). But it may happen much later. It is similar to acne which is a disease of the sweat glands. This condition is slightly more common in African-Americans and women. SYMPTOMS   Hidradenitis usually starts as one or more red, tender, swellings in the groin or under the arms (axilla).  Over a period of hours to days the lesions get larger. They often open to the skin surface, draining clear to yellow-colored fluid.  The infected area heals with scarring. DIAGNOSIS  Your caregiver makes this diagnosis by looking at you. Sometimes cultures (growing germs on plates in the lab) may be taken. This is to see what germ (bacterium) is causing the infection.  TREATMENT   Topical germ killing medicine applied to the skin (antibiotics) are the treatment of choice. Antibiotics taken by mouth (systemic) are sometimes needed when the condition is getting worse or is severe.  Avoid tight-fitting clothing which traps moisture in.  Dirt does not cause hidradenitis and it is not caused by poor hygiene.  Involved areas should be cleaned daily using an antibacterial soap. Some patients find that the liquid form of Lever 2000, applied to the involved areas as a lotion after bathing, can help reduce the odor related to this condition.  Sometimes surgery is needed to drain infected areas or remove scarred tissue. Removal of  large amounts of tissue is used only in severe cases.  Birth control pills may be helpful.  Oral retinoids (vitamin A derivatives) for 6 to 12 months which are effective for acne may also help this condition.  Weight loss will improve but not cure hidradenitis. It is made worse by being overweight. But the condition is not caused by being overweight.  This condition is more common in people who have had acne.  It may become worse under stress. There is no medical cure for hidradenitis. It can be controlled, but not cured. The condition usually continues for years with periods of getting worse and getting better (remission). Document Released: 08/13/2003 Document Revised: 03/23/2011 Document Reviewed: 03/31/2013 [facility] Patient Information 2015 [place], [place]. This information is not intended to replace advice given to you by your health care provider. Make sure you discuss any questions you have with your health care provider.  Community-Associated MRSA CA-MRSA stands for community-associated methicillin-resistant Staphylococcus aureus. MRSA is a type of bacteria that is resistant to some common antibiotics. It can cause infections in the skin and many other places in the body. Staphylococcus aureus, often called "staph," is a bacteria that normally lives on the skin or in the nose. Staph on the surface of the skin or in the nose does not cause problems. However, if the staph enters the body through a cut, wound, or break in the skin, an infection can happen. Up until recently, infections with the MRSA type of staph mainly occurred in hospitals and other health care settings. There are now  increasing problems with MRSA infections in the community as well. Infections with MRSA may be very serious or even life threatening. CA-MRSA is becoming more common. It is known to spread in crowded settings, in jails and prisons, and in situations where there is close skin-to-skin contact, such as during  sporting events or in locker rooms. MRSA can be spread through shared items, such as children's toys, razors, towels, or sports equipment.  CAUSES All staph, including MRSA, are normally harmless unless they enter the body through a scratch, cut, or wound, such as with surgery. All staph, including MRSA, can be spread from person-to-person by touching contaminated objects or through direct contact.  MRSA now causes illness in people who have not been in hospitals or other health care facilities. Cases of MRSA diseases in the community have been associated with:  Recent antibiotic use.  Sharing contaminated towels or clothes.  Having active skin diseases.  Participating in contact sports.  Living in crowded settings.  Intravenous (IV) drug use.  Community-associated MRSA infections are usually skin infections, but may cause other severe illnesses.  Staph bacteria are one of the most common causes of skin infection. However, they are also a common cause of pneumonia, bone or joint infections, and bloodstream infections. DIAGNOSIS Diagnosis of MRSA is done by cultures of fluid samples that may come from:  Swabs taken from cuts or wounds in infected areas.  Nasal swabs.  Saliva or deep cough specimens from the lungs (sputum).  Urine.  Blood. Many people are "colonized" with MRSA but have no signs of infection. This means that people carry the MRSA germ on their skin or in their nose and may never develop MRSA infection.  TREATMENT  Treatment varies and is based on how serious, how deep, or how extensive the infection is. For example:  Some skin infections, such as a small boil or abscess, may be treated by draining yellowish-white fluid (pus) from the site of the infection.  Deeper or more widespread soft tissue infections are usually treated with surgery to drain pus and with antibiotic medicine given by vein or by mouth. This may be recommended even if you are  pregnant.  Serious infections may require a hospital stay. If antibiotics are given, they may be needed for several weeks. PREVENTION Because many people are colonized with staph, including MRSA, preventing the spread of the bacteria from person-to-person is most important. The best way to prevent the spread of bacteria and other germs is through proper hand washing or by using alcohol-based hand disinfectants. The following are other ways to help prevent MRSA infection within community settings.   Wash your hands frequently with soap and water for at least 15 seconds. Otherwise, use alcohol-based hand disinfectants when soap and water is not available.  Make sure people who live with you wash their hands often, too.  Do not share personal items. For example, avoid sharing razors and other personal hygiene items, towels, clothing, and athletic equipment.  Wash and dry your clothes and bedding at the warmest temperatures recommended on the labels.  Keep wounds covered. Pus from infected sores may contain MRSA and other bacteria. Keep cuts and abrasions clean and covered with germ-free (sterile), dry bandages until they are healed.  If you have a wound that appears infected, ask your caregiver if a culture for MRSA and other bacteria should be done.  If you are breastfeeding, talk to your caregiver about MRSA. You may be asked to temporarily stop breastfeeding. HOME CARE  INSTRUCTIONS   Take your antibiotics as directed. Finish them even if you start to feel better.  Avoid close contact with those around you as much as possible. Do not use towels, razors, toothbrushes, bedding, or other items that will be used by others.  To fight the infection, follow your caregiver's instructions for wound care. Wash your hands before and after changing your bandages.  If you have an intravascular device, such as a catheter, make sure you know how to care for it.  Be sure to tell any health care  providers that you have MRSA so they are aware of your infection. SEEK IMMEDIATE MEDICAL CARE IF:  The infection appears to be getting worse. Signs include:  Increased warmth, redness, or tenderness around the wound site.  A red line that extends from the infection site.  A dark color in the area around the infection.  Wound drainage that is tan, yellow, or green.  A bad smell coming from the wound.  You feel sick to your stomach (nauseous) and throw up (vomit) or cannot keep medicine down.  You have a fever.  Your baby is older than 3 months with a rectal temperature of 102F (38.9C) or higher.  Your baby is 75 months old or younger with a rectal temperature of 100.9F (38C) or higher.  You have difficulty breathing. MAKE SURE YOU:   Understand these instructions.  Will watch your condition.  Will get help right away if you are not doing well or get worse. Document Released: 04/03/2005 Document Revised: 05/15/2013 Document Reviewed: 04/03/2010 [facility] Patient Information 2015 [place], [place]. This information is not intended to replace advice given to you by your health care provider. Make sure you discuss any questions you have with your health care provider.

## 2013-10-02 NOTE — ED Notes (Signed)
Abscesses to bil axilla Has been seen here for same.

## 2013-10-02 NOTE — ED Notes (Signed)
Abscess under arms per pt. Has had several lanced under arms recently.

## 2013-10-03 NOTE — Telephone Encounter (Signed)
Post ED Visit - Positive Culture Follow-up  Culture report reviewed by antimicrobial stewardship pharmacist: []  Wes Dulaney, Pharm.D., BCPS []  , Pharm.D., BCPS []  , Pharm.D., BCPS []  [place], .D., BCPS, AAHIVP []  Georgina Pillion, Pharm.D., BCPS, AAHIVP []  Carly Sabat, Pharm.D. [x]  , Pharm.D.  Positive abcess culture Treated with Bactrim 1 tab bid for 7 days, no further patient follow-up is required at this time.   [place] 10/03/2013, 12:54 PM
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
## 2013-10-05 NOTE — ED Provider Notes (Signed)
CSN: [id number]     Arrival date & time 10/02/13  1936 History   First MD Initiated Contact with Patient 10/02/13 2032     Chief Complaint  Patient presents with  . Abscess     (Consider location/radiation/quality/duration/timing/severity/associated sxs/prior Treatment) The history is provided by the patient.   [name] is a 48 y.o. female presenting with persistent bilateral axillary abscesses since she last underwent I & D of an abscess in the right axilla here 5 days ago.  She is currently on day 5 of a 7 day course of bactrim.  She denies fevers, chills, nausea, vomiting, flu like symptoms.  She continues to use warm compresses but the areas continue to be painful and now has several "pimples" in the left axilla.  She has been in contact with general surgery, Dr Lovell Sheehan office but is now awaiting an appointment for further care of this problem.  She denies radiation of pain, is taking hydrocodone with some relief of pain.  She is on plaquenil and methotrexate for her rheumatoid arthitis and is concerned these medications are affecting her response to this infection.     Past Medical History  Diagnosis Date  . Rheumatoid arthritis(714.0)   . Chronic anemia   . Hypertension   . Arthritis, rheumatoid   . Incidental lung nodule   . Depression   . Chronic pain   . Chronic back pain    Past Surgical History  Procedure Laterality Date  . Tubal ligation    . Endometrial ablation    . Breast cyst excision  12/31/2010    Procedure: CYST EXCISION BREAST;  Surgeon: Fabio Bering, MD;  Location: [facility];  Service: General;  Laterality: Right;  Excision Sebaceous Cyst Right Breast   Family History  Problem Relation Age of Onset  . Anesthesia problems Neg Hx   . Cancer Father    History  Substance Use Topics  . Smoking status: Current Every Day Smoker -- 0.03 packs/day for 30 years    Types: Cigarettes  . Smokeless tobacco: Never Used  . Alcohol Use: No     Comment:  occassional   OB History   Grav Para Term Preterm Abortions TAB SAB Ect Mult Living   6 5 5  1  1   5      Review of Systems  Constitutional: Negative for fever and chills.  Respiratory: Negative for shortness of breath and wheezing.   Skin: Positive for color change and wound.  Neurological: Negative for numbness.      Allergies  Codeine  Home Medications   Prior to Admission medications   Medication Sig Start Date End Date Taking? Authorizing Provider  Calcium Carbonate-Vitamin D (CALCIUM 600 + D PO) Take 1 tablet by mouth every morning.    Yes Historical Provider, MD  ferrous sulfate 325 (65 FE) MG tablet Take 325 mg by mouth 2 (two) times daily. For anemia    Yes Historical Provider, MD  folic acid (FOLVITE) 1 MG tablet Take 1 mg by mouth daily.     Yes Historical Provider, MD  HYDROcodone-acetaminophen (NORCO/VICODIN) 5-325 MG per tablet Take 1 tablet by mouth every 4 (four) hours as needed. 09/29/13  Yes 10/01/13, PA-C  hydroxychloroquine (PLAQUENIL) 200 MG tablet Take 400 mg by mouth 2 (two) times daily.   Yes Historical Provider, MD  ibuprofen (ADVIL,MOTRIN) 800 MG tablet Take 800 mg by mouth every 8 (eight) hours as needed for headache, mild pain or moderate pain.  Yes Historical Provider, MD  methotrexate (RHEUMATREX) 2.5 MG tablet Take 12.5 mg by mouth 2 (two) times a week. Sundays and Mondays: Caution:Chemotherapy. Protect from light.   Yes Historical Provider, MD  Multiple Vitamin (MULTIVITAMIN WITH MINERALS) TABS Take 1 tablet by mouth daily.   Yes Historical Provider, MD  HYDROcodone-acetaminophen (NORCO/VICODIN) 5-325 MG per tablet Take 1 tablet by mouth every 4 (four) hours as needed. 10/02/13   Burgess Amor, PA-C  predniSONE (DELTASONE) 10 MG tablet Take 10 mg by mouth 2 (two) times daily.     Historical Provider, MD  sulfamethoxazole-trimethoprim (SEPTRA DS) 800-160 MG per tablet Take 1 tablet by mouth every 12 (twelve) hours. 10/02/13   Burgess Amor, PA-C   BP  132/88  Pulse 87  Temp(Src) 99.1 F (37.3 C) (Oral)  Resp 20  Ht 5' 6.5" (1.689 m)  Wt 158 lb (71.668 kg)  BMI 25.12 kg/m2  SpO2 100% Physical Exam  Constitutional: She is oriented to person, place, and time. She appears well-developed and well-nourished.  HENT:  Head: Normocephalic.  Cardiovascular: Normal rate.   Pulmonary/Chest: Effort normal.  Neurological: She is alert and oriented to person, place, and time. No sensory deficit.  Skin: Skin is warm and dry.  Erythema without red streaking right axilla.  Pt has 2 linear areas of induration without fluctuance or drainage. The third lesion which was previously I & D'd appears to be receding.   Left axilla reveals 3 scattered small indurated papules, no fluctuance, no red streaking.    ED Course  Procedures (including critical care time) Labs Review Labs Reviewed  CBC WITH DIFFERENTIAL - Abnormal; Notable for the following:    Hemoglobin 10.6 (*)    HCT 32.5 (*)    RDW 19.7 (*)    Platelets 517 (*)    All other components within normal limits  BASIC METABOLIC PANEL - Abnormal; Notable for the following:    Potassium 3.5 (*)    BUN 5 (*)    All other components within normal limits    Imaging Review No results found.   EKG Interpretation None      MDM   Final diagnoses:  Hidradenitis axillaris  MRSA infection    Prior culture reviewed, mrsa positive, sensitive to bactrim.  Extended bactrim x 7 days.  Gave additional pain medication.  Encouraged continued warm compresses, gave referral to [facility] in [place] in event she cannot get in with local surgeon expeditiously.    Labs reviewed,  Pt does not appear to be immunosuppressed, wbc normal range which is reassuring given other home meds.  Pt discussed with Dr. Adriana Simas prior to dc home.    Burgess Amor, PA-C 10/05/13 1206

## 2013-10-06 NOTE — ED Provider Notes (Signed)
Medical screening examination/treatment/procedure(s) were conducted as a shared visit with non-physician practitioner(s) and myself.  I personally evaluated the patient during the encounter.   EKG Interpretation None     Recurrent axillary abscesses. Referral to general surgery.  Donnetta Hutching, MD 10/06/13 2019

## 2013-11-13 ENCOUNTER — Encounter (HOSPITAL_COMMUNITY): Payer: Self-pay | Admitting: Emergency Medicine

## 2013-11-20 ENCOUNTER — Emergency Department (HOSPITAL_COMMUNITY): Payer: Self-pay

## 2013-11-20 ENCOUNTER — Emergency Department (HOSPITAL_COMMUNITY)
Admission: EM | Admit: 2013-11-20 | Discharge: 2013-11-20 | Disposition: A | Discharge status: Home / Self Care | Payer: Self-pay | Attending: Emergency Medicine | Admitting: Emergency Medicine

## 2013-11-20 ENCOUNTER — Encounter (HOSPITAL_COMMUNITY): Payer: Self-pay | Admitting: *Deleted

## 2013-11-20 DIAGNOSIS — T1490XA Injury, unspecified, initial encounter: Secondary | ICD-10-CM

## 2013-11-20 DIAGNOSIS — S3991XA Unspecified injury of abdomen, initial encounter: Secondary | ICD-10-CM | POA: Insufficient documentation

## 2013-11-20 DIAGNOSIS — M069 Rheumatoid arthritis, unspecified: Secondary | ICD-10-CM | POA: Insufficient documentation

## 2013-11-20 DIAGNOSIS — I10 Essential (primary) hypertension: Secondary | ICD-10-CM | POA: Insufficient documentation

## 2013-11-20 DIAGNOSIS — S29001A Unspecified injury of muscle and tendon of front wall of thorax, initial encounter: Secondary | ICD-10-CM | POA: Insufficient documentation

## 2013-11-20 DIAGNOSIS — S79911A Unspecified injury of right hip, initial encounter: Secondary | ICD-10-CM | POA: Insufficient documentation

## 2013-11-20 DIAGNOSIS — S199XXA Unspecified injury of neck, initial encounter: Secondary | ICD-10-CM | POA: Insufficient documentation

## 2013-11-20 DIAGNOSIS — Z7952 Long term (current) use of systemic steroids: Secondary | ICD-10-CM | POA: Insufficient documentation

## 2013-11-20 DIAGNOSIS — Z79899 Other long term (current) drug therapy: Secondary | ICD-10-CM | POA: Insufficient documentation

## 2013-11-20 DIAGNOSIS — D649 Anemia, unspecified: Secondary | ICD-10-CM | POA: Insufficient documentation

## 2013-11-20 DIAGNOSIS — G8929 Other chronic pain: Secondary | ICD-10-CM | POA: Insufficient documentation

## 2013-11-20 DIAGNOSIS — R1011 Right upper quadrant pain: Secondary | ICD-10-CM

## 2013-11-20 DIAGNOSIS — Z720 Tobacco use: Secondary | ICD-10-CM | POA: Insufficient documentation

## 2013-11-20 DIAGNOSIS — M25551 Pain in right hip: Secondary | ICD-10-CM

## 2013-11-20 DIAGNOSIS — R079 Chest pain, unspecified: Secondary | ICD-10-CM

## 2013-11-20 DIAGNOSIS — Y9389 Activity, other specified: Secondary | ICD-10-CM | POA: Insufficient documentation

## 2013-11-20 DIAGNOSIS — Z8659 Personal history of other mental and behavioral disorders: Secondary | ICD-10-CM | POA: Insufficient documentation

## 2013-11-20 DIAGNOSIS — M25511 Pain in right shoulder: Secondary | ICD-10-CM

## 2013-11-20 DIAGNOSIS — Y998 Other external cause status: Secondary | ICD-10-CM | POA: Insufficient documentation

## 2013-11-20 DIAGNOSIS — S4991XA Unspecified injury of right shoulder and upper arm, initial encounter: Secondary | ICD-10-CM | POA: Insufficient documentation

## 2013-11-20 DIAGNOSIS — Y9289 Other specified places as the place of occurrence of the external cause: Secondary | ICD-10-CM | POA: Insufficient documentation

## 2013-11-20 DIAGNOSIS — W19XXXA Unspecified fall, initial encounter: Secondary | ICD-10-CM

## 2013-11-20 LAB — CBC WITH DIFFERENTIAL/PLATELET
BASOS PCT: 0 % (ref 0–1)
Basophils Absolute: 0 10*3/uL (ref 0.0–0.1)
Eosinophils Absolute: 0.2 10*3/uL (ref 0.0–0.7)
Eosinophils Relative: 3 % (ref 0–5)
HEMATOCRIT: 35.5 % — AB (ref 36.0–46.0)
HEMOGLOBIN: 11.3 g/dL — AB (ref 12.0–15.0)
Lymphocytes Relative: 21 % (ref 12–46)
Lymphs Abs: 1.6 10*3/uL (ref 0.7–4.0)
MCH: 26 pg (ref 26.0–34.0)
MCHC: 31.8 g/dL (ref 30.0–36.0)
MCV: 81.8 fL (ref 78.0–100.0)
Monocytes Absolute: 0.4 10*3/uL (ref 0.1–1.0)
Monocytes Relative: 5 % (ref 3–12)
Neutro Abs: 5.7 10*3/uL (ref 1.7–7.7)
Neutrophils Relative %: 71 % (ref 43–77)
Platelets: 642 10*3/uL — ABNORMAL HIGH (ref 150–400)
RBC: 4.34 MIL/uL (ref 3.87–5.11)
RDW: 18.9 % — ABNORMAL HIGH (ref 11.5–15.5)
WBC: 8 10*3/uL (ref 4.0–10.5)

## 2013-11-20 LAB — COMPREHENSIVE METABOLIC PANEL
ALBUMIN: 4.1 g/dL (ref 3.5–5.2)
ALT: 10 U/L (ref 0–35)
ANION GAP: 14 (ref 5–15)
AST: 18 U/L (ref 0–37)
Alkaline Phosphatase: 159 U/L — ABNORMAL HIGH (ref 39–117)
BUN: 7 mg/dL (ref 6–23)
CO2: 24 mEq/L (ref 19–32)
CREATININE: 0.63 mg/dL (ref 0.50–1.10)
Calcium: 9.5 mg/dL (ref 8.4–10.5)
Chloride: 104 mEq/L (ref 96–112)
GFR calc Af Amer: >90 mL/min (ref >90)
GFR calc non Af Amer: >90 mL/min (ref >90)
Glucose, Bld: 102 mg/dL — ABNORMAL HIGH (ref 70–99)
Potassium: 3.5 mEq/L — ABNORMAL LOW (ref 3.7–5.3)
Sodium: 142 mEq/L (ref 137–147)
Total Bilirubin: 0.2 mg/dL — ABNORMAL LOW (ref 0.3–1.2)
Total Protein: 8.6 g/dL — ABNORMAL HIGH (ref 6.0–8.3)

## 2013-11-20 LAB — URINALYSIS, ROUTINE W REFLEX MICROSCOPIC
BILIRUBIN URINE: NEGATIVE
Glucose, UA: NEGATIVE mg/dL
Ketones, ur: NEGATIVE mg/dL
Leukocytes, UA: NEGATIVE
NITRITE: NEGATIVE
UROBILINOGEN UA: 0.2 mg/dL (ref 0.0–1.0)
pH: 5.5 (ref 5.0–8.0)

## 2013-11-20 LAB — URINE MICROSCOPIC-ADD ON

## 2013-11-20 LAB — I-STAT BETA HCG BLOOD, ED (MC, WL, AP ONLY): I-stat hCG, quantitative: <5 m[IU]/mL (ref <5)

## 2013-11-20 LAB — LIPASE, BLOOD: LIPASE: 41 U/L (ref 11–59)

## 2013-11-20 MED ORDER — OXYCODONE-ACETAMINOPHEN 5-325 MG PO TABS: 1.0000 | ORAL_TABLET | Freq: Three times a day (TID) | ORAL | Status: DC | PRN

## 2013-11-20 MED ORDER — HYDROMORPHONE HCL 1 MG/ML IJ SOLN
1.0000 mg | Freq: Once | INTRAMUSCULAR | Status: AC
  Administered 2013-11-20: 1 mg via INTRAVENOUS
  Filled 2013-11-20: qty 1

## 2013-11-20 MED ORDER — IOHEXOL 300 MG/ML  SOLN
100.0000 mL | Freq: Once | INTRAMUSCULAR | Status: AC | PRN
  Administered 2013-11-20: 100 mL via INTRAVENOUS
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
## 2013-11-20 NOTE — ED Notes (Signed)
Patient returned from radiology. Patient states pain was better, but increased during xrays. Patient resting in bed at this time.

## 2013-11-20 NOTE — ED Notes (Signed)
Patient verbalizes understanding of discharge instructions, prescription medications, and home care. Patient ambulatory out of department at this time.
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
## 2013-11-22 IMAGING — CT CT ABD-PELV W/ CM
2 of 5 series · 13 of 36 positions shown, 16 images · IV contrast (Omnipaque 300)
Comparison: Multiple prior CTs, including 09/22/2011 and
05/09/2011

CT CHEST

CLINICAL DATA: Fall from ladder, right chest/abdomen pain

CT CHEST, ABDOMEN AND PELVIS WITH CONTRAST
TECHNIQUE: Multidetector CT imaging of the chest, abdomen and
pelvis was performed following the standard protocol during bolus
administration of intravenous contrast.
Contrast:  100 ml Jmnipaque-HJJ IV

[Series 2: cap with 5.0 b40f · axial · 0.68mm/px · z∈[-652,-57]mm · 10 of 137 slices shown, 13 images]
[im 9/137  mediastinal]
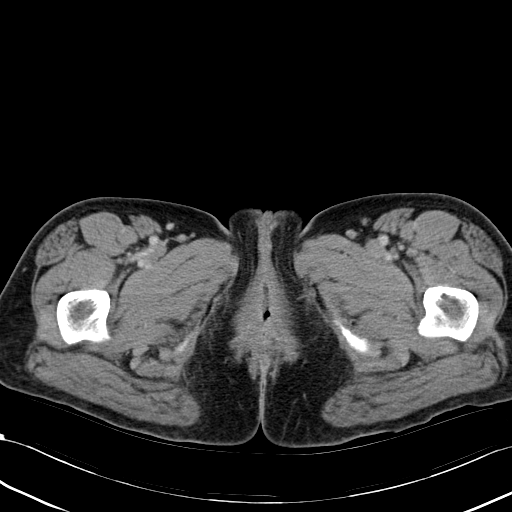
[im 9/137  lung]
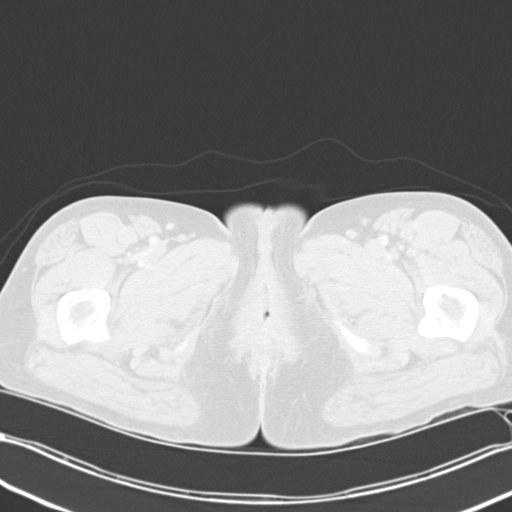
[im 26/137  lung]
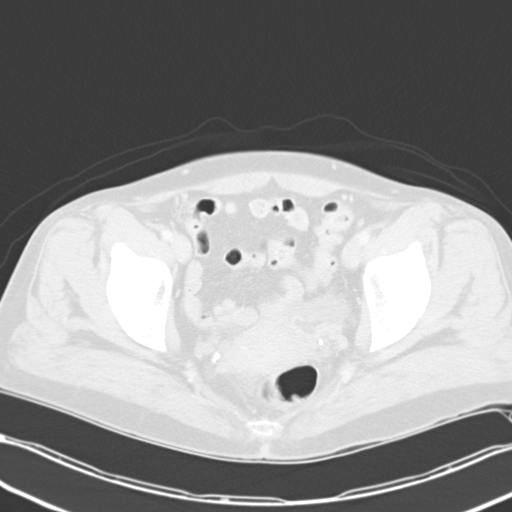
[im 35/137  lung]
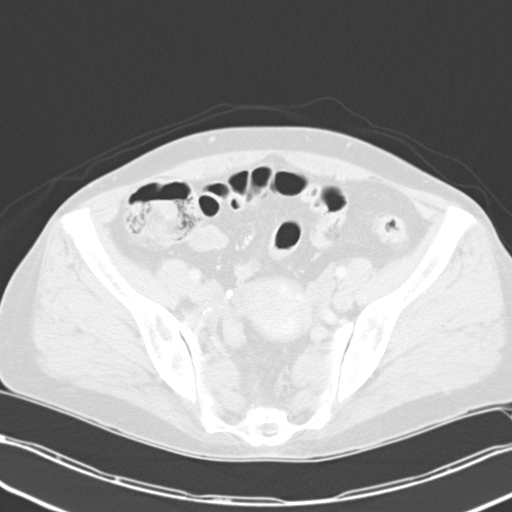
[im 52/137  lung]
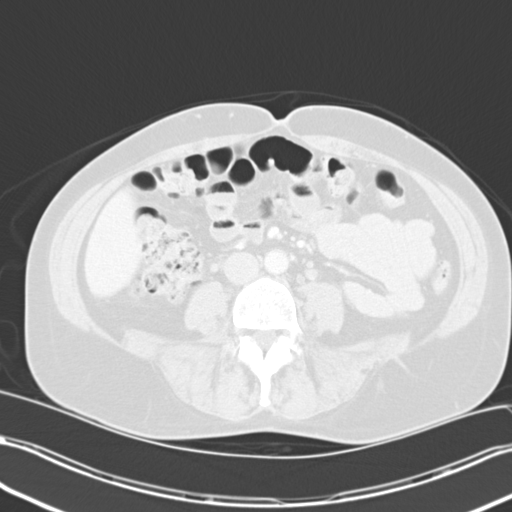
[im 60/137  mediastinal]
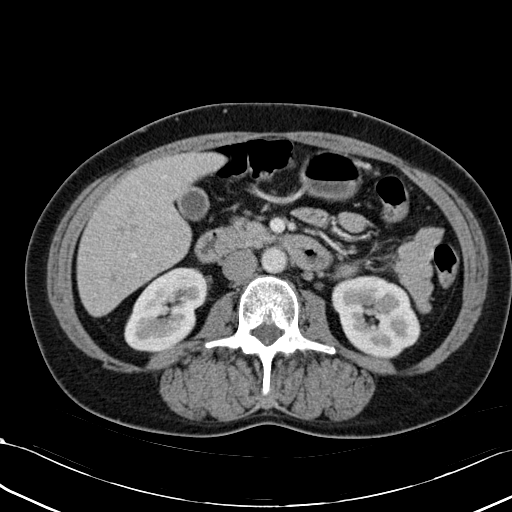
[im 60/137  lung]
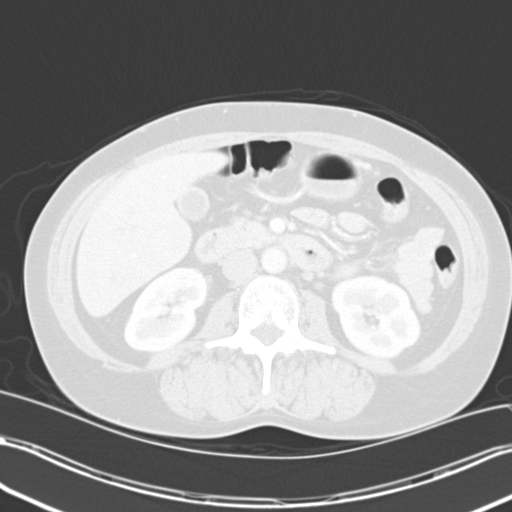
[im 77/137  lung]
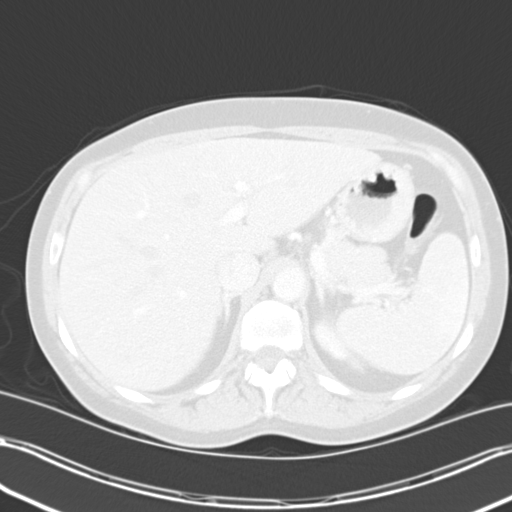
[im 86/137  lung]
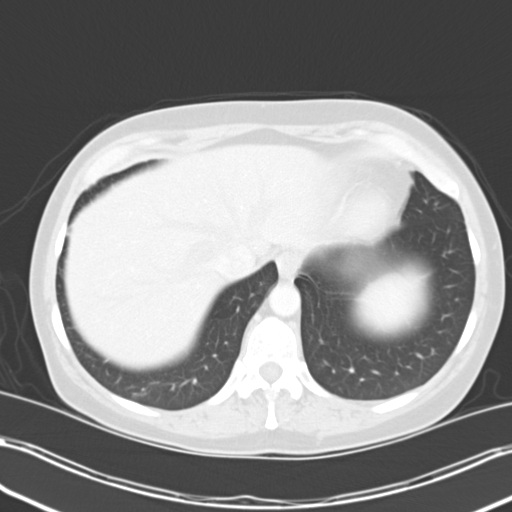
[im 103/137  lung]
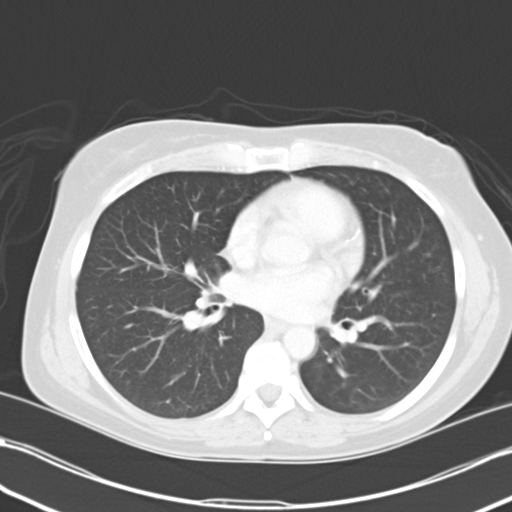
[im 111/137  mediastinal]
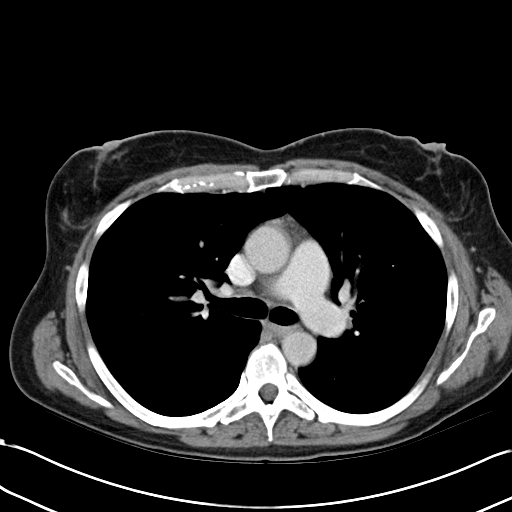
[im 111/137  lung]
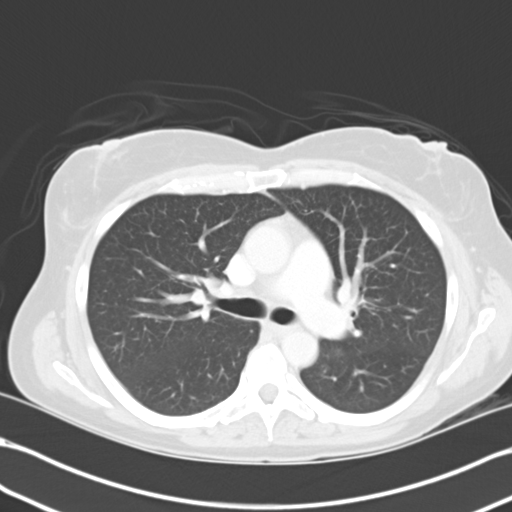
[im 128/137  lung]
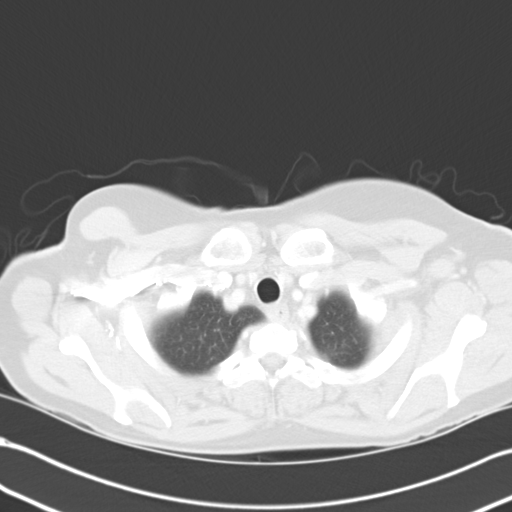

[Series 4: mpr cor post contrast (id) · coronal · 0.67mm/px · 3 of 84 slices shown]
[im 17/84  lung]
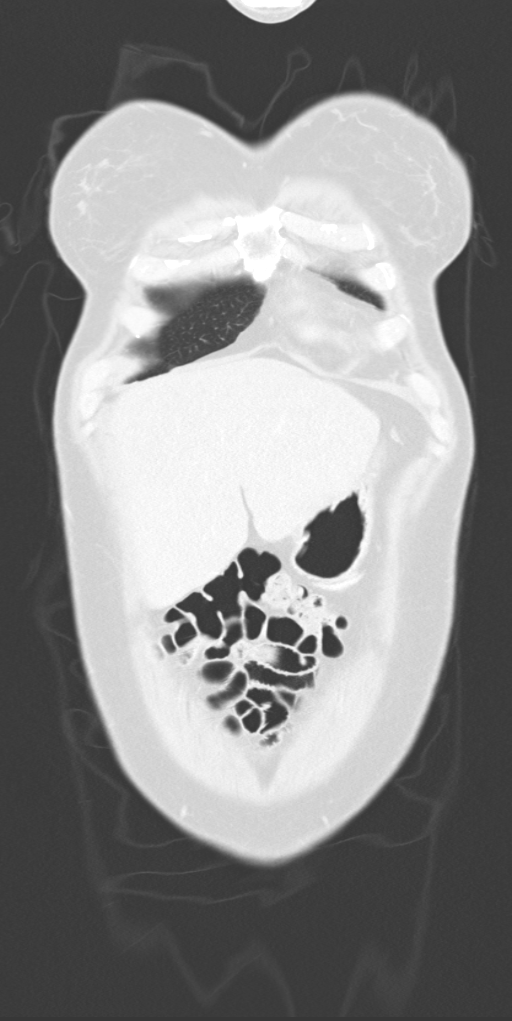
[im 34/84  lung]
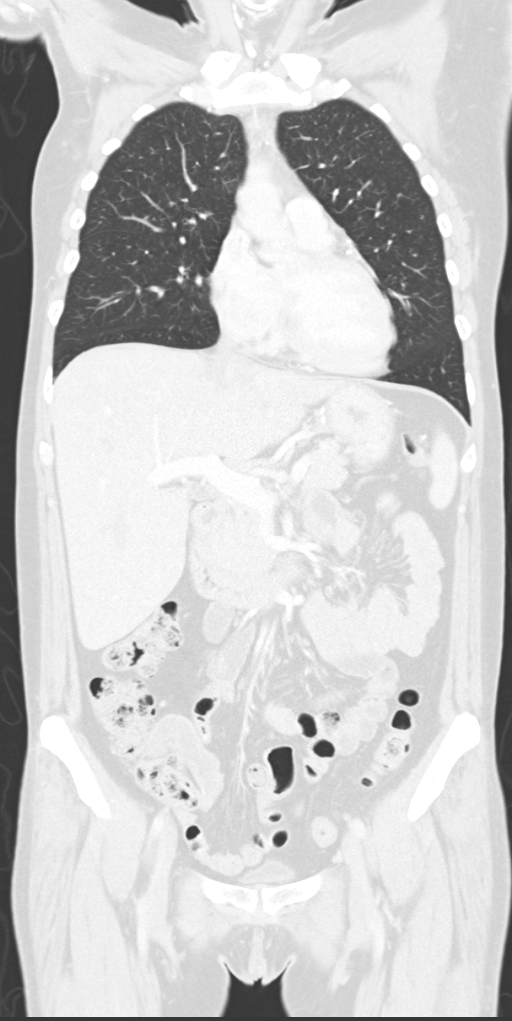
[im 50/84  lung]
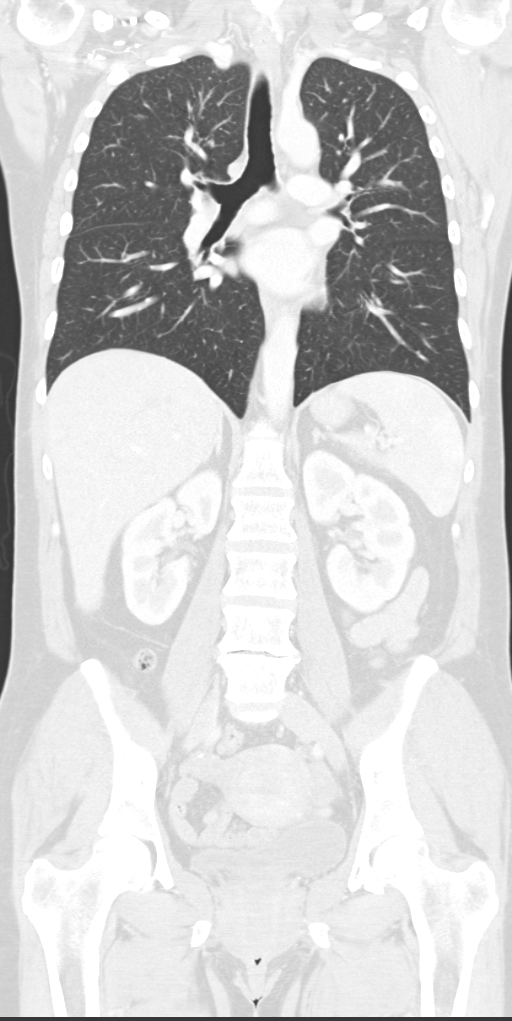

[13 of 36 positions shown; findings below may reference images not displayed]

FINDINGS: 4 x 6 mm nodule in the right lower lobe (series 3/image
43), unchanged from 05/09/2011.

No pleural effusion or pneumothorax.

Stable right thyroid nodule.

The heart is normal in size.  No pericardial effusion.  Age
advanced coronary atherosclerosis.  Atherosclerotic calcifications
of the aortic arch.

No suspicious mediastinal, hilar, or axillary lymphadenopathy.

Visualized osseous structures are within normal limits.  No
fracture is seen.
IMPRESSION: No evidence of traumatic injury to the chest.

4 x 6 mm right lower lobe nodule, unchanged from 05/09/2011, likely
benign.  A single follow-up CT is suggested to document 12 month
stability (if low risk for bronchogenic carcinoma) or 18-24 month
stability (if high risk).

Age advanced coronary atherosclerosis.

This recommendation follows the consensus statement: Guidelines for
Management of Small Pulmonary Nodules Detected on CT Scans: A
Statement from the [HOSPITAL] as published in Radiology
6221; [DATE].

CT ABDOMEN AND PELVIS
FINDINGS: Liver, spleen, pancreas, and adrenal glands are within
normal limits.

Gallbladder is underdistended.  No intrahepatic or extrahepatic
ductal dilatation.

Kidneys are within normal limits.  No hydronephrosis.

No evidence of bowel obstruction.  Normal appendix.

Atherosclerotic calcifications of the abdominal aorta and branch
vessels.

No abdominopelvic ascites.

No hemoperitoneum.  No free air.

No suspicious abdominopelvic lymphadenopathy.

Uterus and bilateral ovaries unremarkable.

Bladder is within normal limits.

Mild degenerative changes at L4-5.  No fracture is seen.
IMPRESSION: No evidence of traumatic injury to the abdomen or pelvis.

## 2013-11-22 IMAGING — CR DG CHEST 1V PORT
1 series · 1 of 1 positions shown · non-contrast
Comparison: CT chest of 09/22/2011

CLINICAL DATA: Fell off ladder

PORTABLE CHEST - 1 VIEW

[view not recorded]
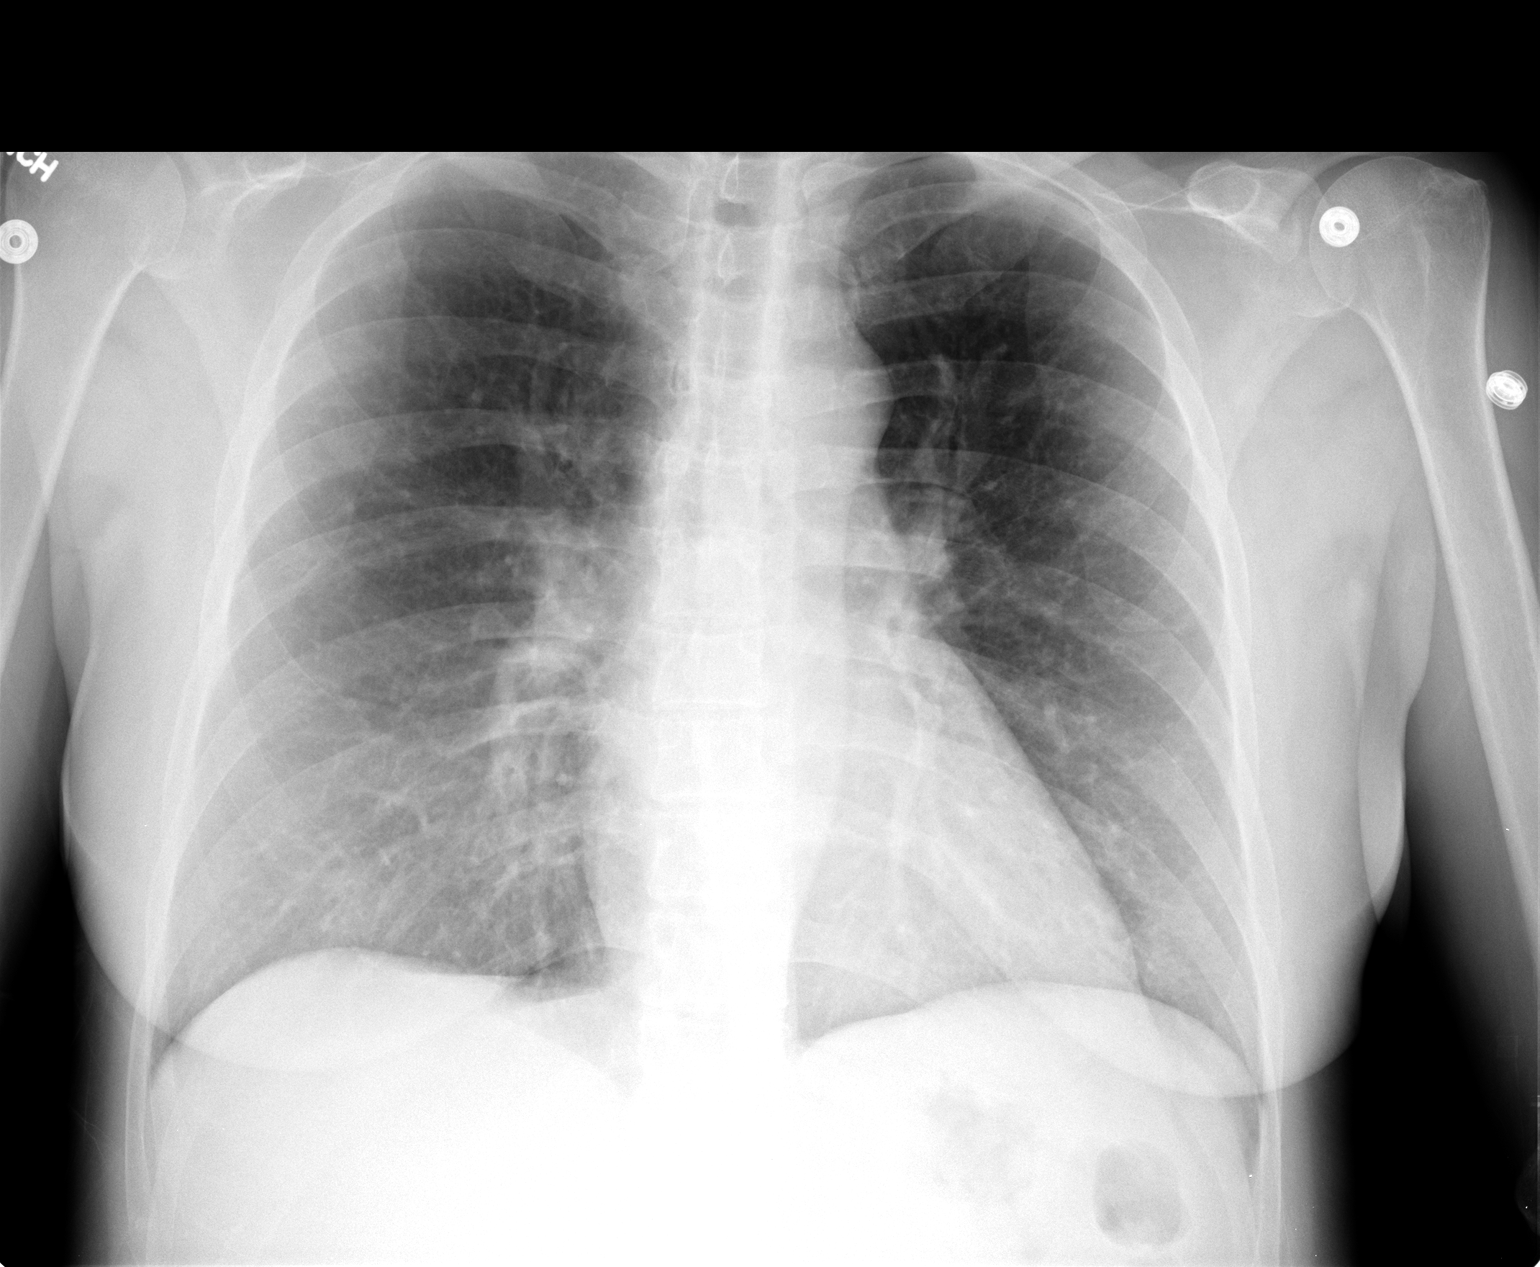

[1 of 1 positions shown; findings below may reference images not displayed]

FINDINGS: No active infiltrate or effusion is seen.  No
pneumothorax is noted.  There is no evidence of pleural effusion.
The heart is within normal limits in size.  No bony abnormality is
seen.
IMPRESSION: No active lung disease.

## 2013-12-29 ENCOUNTER — Encounter (HOSPITAL_COMMUNITY): Payer: Self-pay | Admitting: Cardiology

## 2013-12-29 ENCOUNTER — Emergency Department (HOSPITAL_COMMUNITY)
Admission: EM | Admit: 2013-12-29 | Discharge: 2013-12-29 | Disposition: A | Discharge status: Home / Self Care | Payer: Self-pay | Attending: Emergency Medicine | Admitting: Emergency Medicine

## 2013-12-29 DIAGNOSIS — G8929 Other chronic pain: Secondary | ICD-10-CM | POA: Insufficient documentation

## 2013-12-29 DIAGNOSIS — Z720 Tobacco use: Secondary | ICD-10-CM | POA: Insufficient documentation

## 2013-12-29 DIAGNOSIS — M79641 Pain in right hand: Secondary | ICD-10-CM | POA: Insufficient documentation

## 2013-12-29 DIAGNOSIS — M25522 Pain in left elbow: Secondary | ICD-10-CM | POA: Insufficient documentation

## 2013-12-29 DIAGNOSIS — D649 Anemia, unspecified: Secondary | ICD-10-CM | POA: Insufficient documentation

## 2013-12-29 DIAGNOSIS — Z8659 Personal history of other mental and behavioral disorders: Secondary | ICD-10-CM | POA: Insufficient documentation

## 2013-12-29 DIAGNOSIS — M25521 Pain in right elbow: Secondary | ICD-10-CM | POA: Insufficient documentation

## 2013-12-29 DIAGNOSIS — Z7952 Long term (current) use of systemic steroids: Secondary | ICD-10-CM | POA: Insufficient documentation

## 2013-12-29 DIAGNOSIS — M79645 Pain in left finger(s): Secondary | ICD-10-CM | POA: Insufficient documentation

## 2013-12-29 DIAGNOSIS — Z79899 Other long term (current) drug therapy: Secondary | ICD-10-CM | POA: Insufficient documentation

## 2013-12-29 DIAGNOSIS — I10 Essential (primary) hypertension: Secondary | ICD-10-CM | POA: Insufficient documentation

## 2013-12-29 DIAGNOSIS — M79642 Pain in left hand: Secondary | ICD-10-CM | POA: Insufficient documentation

## 2013-12-29 DIAGNOSIS — M069 Rheumatoid arthritis, unspecified: Secondary | ICD-10-CM | POA: Insufficient documentation

## 2013-12-29 DIAGNOSIS — M79644 Pain in right finger(s): Secondary | ICD-10-CM | POA: Insufficient documentation

## 2013-12-29 MED ORDER — OXYCODONE-ACETAMINOPHEN 5-325 MG PO TABS
1.0000 | ORAL_TABLET | Freq: Once | ORAL | Status: AC
  Administered 2013-12-29: 1 via ORAL
  Filled 2013-12-29: qty 1

## 2013-12-29 MED ORDER — OXYCODONE-ACETAMINOPHEN 5-325 MG PO TABS: 1.0000 | ORAL_TABLET | ORAL | Status: DC | PRN

## 2013-12-29 NOTE — ED Notes (Signed)
Patient denies pain and is resting comfortably.  

## 2013-12-29 NOTE — Discharge Instructions (Signed)
Rheumatoid Arthritis Rheumatoid arthritis is a disease that causes pain, puffiness (swelling), and stiffness of the joints. It is a long-term (chronic) disease. It can affect the whole body, even the eyes and lungs.  HOME CARE  Stay active, but lessen activity when the disease gets worse.  Eat healthy foods.  Put heat on the affected joints when you wake up and before activity. Keep the heat on for as long as told by your doctor.  Put ice on the affected joints after activity or exercise.  Put ice in a plastic bag.  Place a towel between your skin and the bag.  Leave the ice on for 15-20 minutes, 03-04 times a day.  Take all medicine and other dietary pills (supplements) as told by your doctor.  Use a splint as told by your doctor. Splints help keep joints in a certain position to keep the joint working right.  Do not sleep with pillows under your knees.  Go to programs that can keep you updated on treatments and ways to deal with your disease. GET HELP RIGHT AWAY IF:  You have times where you pass out (faint).  You have times where you are really weak.  You suddenly have a hot, painful joint that feels worse than your normal joint ache.  You have chills.  You have a fever. Document Released: 03/23/2011 Document Revised: 05/15/2013 Document Reviewed: 03/23/2011 [facility] Patient Information 2015 [place], [place]. This information is not intended to replace advice given to you by your health care provider. Make sure you discuss any questions you have with your health care provider.

## 2013-12-29 NOTE — ED Notes (Signed)
Bilateral arm and hand pain.  States its her RA.  Can't get into see rheumatologist until next week.

## 2013-12-29 NOTE — ED Notes (Signed)
Pain both hands and up into arms.  Hx of arthritis with pain and swelling.

## 2013-12-30 NOTE — ED Provider Notes (Signed)
CSN: [id number]     Arrival date & time 12/29/13  1705 History   First MD Initiated Contact with Patient 12/29/13 1747     Chief Complaint  Patient presents with  . Rheumatoid Arthritis     (Consider location/radiation/quality/duration/timing/severity/associated sxs/prior Treatment) HPI   [name] is a 48 y.o. female with a history of rheumatoid arthritis who presents to the Emergency Department complaining of acute on chronic pain to the joints of her fingers and both elbows.  She states that the pain has been worse today. She reports the pain feels similar to previous RA flares.  She states that she works at [employer] and believes the excessive use of her hands has exacerbated her pain. She states that she has been taking her prednisone and methotrexate as directed without relief. She denies any numbness or weakness of her extremities, fever, chills, excessive heat of the joints or recent injury.   Past Medical History  Diagnosis Date  . Rheumatoid arthritis(714.0)   . Chronic anemia   . Hypertension   . Arthritis, rheumatoid   . Incidental lung nodule   . Depression   . Chronic pain   . Chronic back pain    Past Surgical History  Procedure Laterality Date  . Tubal ligation    . Endometrial ablation    . Breast cyst excision  12/31/2010    Procedure: CYST EXCISION BREAST;  Surgeon: Fabio Bering, MD;  Location: [facility];  Service: General;  Laterality: Right;  Excision Sebaceous Cyst Right Breast   Family History  Problem Relation Age of Onset  . Anesthesia problems Neg Hx   . Cancer Father    History  Substance Use Topics  . Smoking status: Current Every Day Smoker -- 0.03 packs/day for 30 years    Types: Cigarettes  . Smokeless tobacco: Never Used  . Alcohol Use: No     Comment: occassional   OB History    Gravida Para Term Preterm AB TAB SAB Ectopic Multiple Living   6 5 5  1  1   5      Review of Systems  Constitutional: Negative for  fever and chills.  HENT: Negative for trouble swallowing.   Respiratory: Negative for shortness of breath.   Cardiovascular: Negative for chest pain.  Gastrointestinal: Negative for nausea, vomiting and abdominal pain.  Genitourinary: Negative for dysuria and difficulty urinating.  Musculoskeletal: Positive for joint swelling and arthralgias (Pain to her fingers of both hands and both elbows). Negative for neck pain and neck stiffness.  Skin: Negative for color change and wound.  Neurological: Negative for dizziness, syncope, weakness, numbness and headaches.  All other systems reviewed and are negative.     Allergies  Codeine  Home Medications   Prior to Admission medications   Medication Sig Start Date End Date Taking? Authorizing Provider  Aspirin-Salicylamide-Caffeine (BC HEADACHE) 325-95-16 MG TABS Take 1 packet by mouth once as needed (for headache).   Yes Historical Provider, MD  Calcium Carbonate-Vitamin D (CALCIUM 600 + D PO) Take 1 tablet by mouth every morning.    Yes Historical Provider, MD  ferrous sulfate 325 (65 FE) MG tablet Take 325 mg by mouth 2 (two) times daily. For anemia    Yes Historical Provider, MD  folic acid (FOLVITE) 1 MG tablet Take 1 mg by mouth daily.     Yes Historical Provider, MD  hydroxychloroquine (PLAQUENIL) 200 MG tablet Take 200 mg by mouth 3 (three) times daily.  Yes Historical Provider, MD  methotrexate (RHEUMATREX) 2.5 MG tablet Take 12.5 mg by mouth 2 (two) times a week. Mondays_TuesdaysCaution:Chemotherapy. Protect from light.   Yes Historical Provider, MD  Multiple Vitamin (MULTIVITAMIN WITH MINERALS) TABS Take 1 tablet by mouth daily.   Yes Historical Provider, MD  predniSONE (DELTASONE) 10 MG tablet Take 20 mg by mouth 2 (two) times daily.    Yes Historical Provider, MD  ibuprofen (ADVIL,MOTRIN) 800 MG tablet Take 800 mg by mouth every 8 (eight) hours as needed for headache, mild pain or moderate pain.    Historical Provider, MD   oxyCODONE-acetaminophen (PERCOCET/ROXICET) 5-325 MG per tablet Take 1 tablet by mouth every 4 (four) hours as needed. 12/29/13   Nissa Stannard L. Sherif Millspaugh, PA-C   BP 128/86 mmHg  Pulse 77  Temp(Src) 98 F (36.7 C) (Oral)  Resp 16  SpO2 100% Physical Exam  Constitutional: She is oriented to person, place, and time. She appears well-developed and well-nourished. No distress.  HENT:  Head: Normocephalic and atraumatic.  Neck: Normal range of motion. Neck supple.  Cardiovascular: Normal rate, regular rhythm, normal heart sounds and intact distal pulses.   No murmur heard. Pulmonary/Chest: Effort normal and breath sounds normal. No respiratory distress. She exhibits no tenderness.  Musculoskeletal: She exhibits tenderness. She exhibits no edema.  Tenderness of the PIP joints of the fingers of both hands with moderate soft tissue swelling.  No excessive warmth or erythema Radial pulse is brisk bilaterally, distal sensation intact.  CR< 2 sec.   no erythema or soft tissue swelling noted to the elbows, patient has full range of motion with pain upon full extension on the right  Lymphadenopathy:    She has no cervical adenopathy.  Neurological: She is alert and oriented to person, place, and time. She exhibits normal muscle tone. Coordination normal.  Skin: Skin is warm and dry.  Nursing note and vitals reviewed.   ED Course  Procedures (including critical care time) Labs Review Labs Reviewed - No data to display  Imaging Review No results found.   EKG Interpretation None      MDM   Final diagnoses:  Rheumatoid arthritis flare    Patient with known history of RA. Reporting pain to bilateral fingers and elbows. No concerning symptoms for septic joint or infectious process at this time has an appointment with her rheumatologist on Tuesday of next week is here due to exacerbation of pain. Vital signs are stable she is non-toxic-appearing. Ambulates with a steady gait she agrees to continue  her current medication regimen and I will prescribe Percocet with the understanding that patient will follow-up with her rheumatologist next week.    Astria Jordahl L. Trisha Mangle, PA-C 12/30/13 0119  Layla Maw Ward, DO 12/30/13 1800

## 2014-01-10 ENCOUNTER — Encounter (HOSPITAL_COMMUNITY): Payer: Self-pay | Admitting: Emergency Medicine

## 2014-01-10 ENCOUNTER — Emergency Department (HOSPITAL_COMMUNITY)
Admission: EM | Admit: 2014-01-10 | Discharge: 2014-01-10 | Disposition: A | Discharge status: Home / Self Care | Payer: Self-pay | Attending: Emergency Medicine | Admitting: Emergency Medicine

## 2014-01-10 ENCOUNTER — Emergency Department (HOSPITAL_COMMUNITY): Payer: Self-pay

## 2014-01-10 DIAGNOSIS — S299XXA Unspecified injury of thorax, initial encounter: Secondary | ICD-10-CM | POA: Insufficient documentation

## 2014-01-10 DIAGNOSIS — Y9389 Activity, other specified: Secondary | ICD-10-CM | POA: Insufficient documentation

## 2014-01-10 DIAGNOSIS — Z720 Tobacco use: Secondary | ICD-10-CM | POA: Insufficient documentation

## 2014-01-10 DIAGNOSIS — G8929 Other chronic pain: Secondary | ICD-10-CM | POA: Insufficient documentation

## 2014-01-10 DIAGNOSIS — S161XXA Strain of muscle, fascia and tendon at neck level, initial encounter: Secondary | ICD-10-CM | POA: Insufficient documentation

## 2014-01-10 DIAGNOSIS — S3992XA Unspecified injury of lower back, initial encounter: Secondary | ICD-10-CM | POA: Insufficient documentation

## 2014-01-10 DIAGNOSIS — F329 Major depressive disorder, single episode, unspecified: Secondary | ICD-10-CM | POA: Insufficient documentation

## 2014-01-10 DIAGNOSIS — Z79899 Other long term (current) drug therapy: Secondary | ICD-10-CM | POA: Insufficient documentation

## 2014-01-10 DIAGNOSIS — Z7952 Long term (current) use of systemic steroids: Secondary | ICD-10-CM | POA: Insufficient documentation

## 2014-01-10 DIAGNOSIS — M069 Rheumatoid arthritis, unspecified: Secondary | ICD-10-CM | POA: Insufficient documentation

## 2014-01-10 DIAGNOSIS — S0003XA Contusion of scalp, initial encounter: Secondary | ICD-10-CM | POA: Insufficient documentation

## 2014-01-10 DIAGNOSIS — Y998 Other external cause status: Secondary | ICD-10-CM | POA: Insufficient documentation

## 2014-01-10 DIAGNOSIS — D649 Anemia, unspecified: Secondary | ICD-10-CM | POA: Insufficient documentation

## 2014-01-10 DIAGNOSIS — I10 Essential (primary) hypertension: Secondary | ICD-10-CM | POA: Insufficient documentation

## 2014-01-10 DIAGNOSIS — Y92410 Unspecified street and highway as the place of occurrence of the external cause: Secondary | ICD-10-CM | POA: Insufficient documentation

## 2014-01-10 MED ORDER — BACLOFEN 10 MG PO TABS: 10.0000 mg | ORAL_TABLET | Freq: Three times a day (TID) | ORAL | Status: AC

## 2014-01-10 MED ORDER — HYDROCODONE-ACETAMINOPHEN 5-325 MG PO TABS: 1.0000 | ORAL_TABLET | ORAL | Status: DC | PRN

## 2014-01-10 MED ORDER — DIAZEPAM 5 MG PO TABS
5.0000 mg | ORAL_TABLET | Freq: Once | ORAL | Status: AC
  Administered 2014-01-10: 5 mg via ORAL
  Filled 2014-01-10: qty 1

## 2014-01-10 MED ORDER — HYDROCODONE-ACETAMINOPHEN 5-325 MG PO TABS
2.0000 | ORAL_TABLET | Freq: Once | ORAL | Status: AC
  Administered 2014-01-10: 2 via ORAL
  Filled 2014-01-10: qty 2
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
## 2014-02-05 IMAGING — CR DG PELVIS 1-2V
1 series · 1 of 1 positions shown · non-contrast
Comparison: CT 11/10/2011

CLINICAL DATA: Pain post fall.

PELVIS - 1-2 VIEW

[view not recorded]
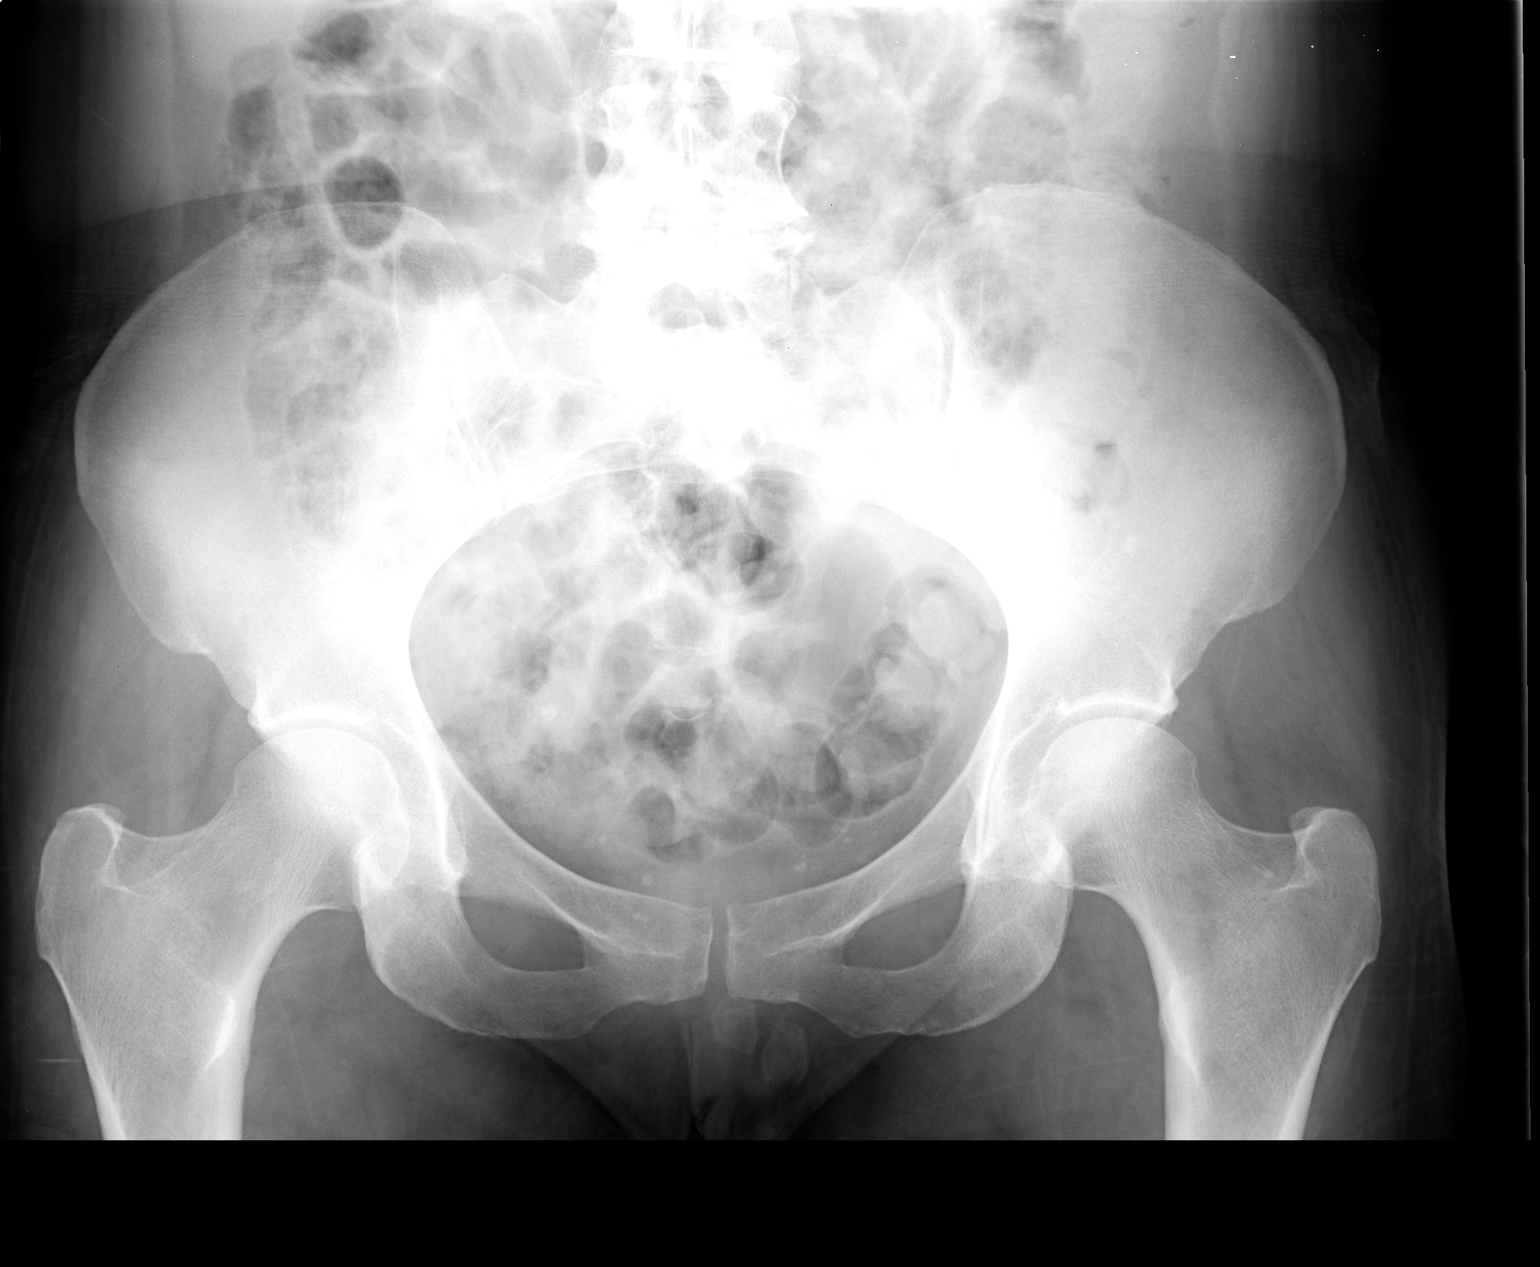

[1 of 1 positions shown; findings below may reference images not displayed]

FINDINGS: Bilateral pelvic phleboliths. Negative for fracture,
dislocation, or other acute abnormality.  Normal alignment and
mineralization. No significant degenerative change.  Regional soft
tissues unremarkable.
IMPRESSION: Negative

## 2014-02-05 IMAGING — CR DG SHOULDER 2+V*R*
3 series · 3 of 3 positions shown · non-contrast
Comparison: 08/29/2009

CLINICAL DATA: Pain post fall.

RIGHT SHOULDER - 2+ VIEW

[view not recorded (1 of 3)]
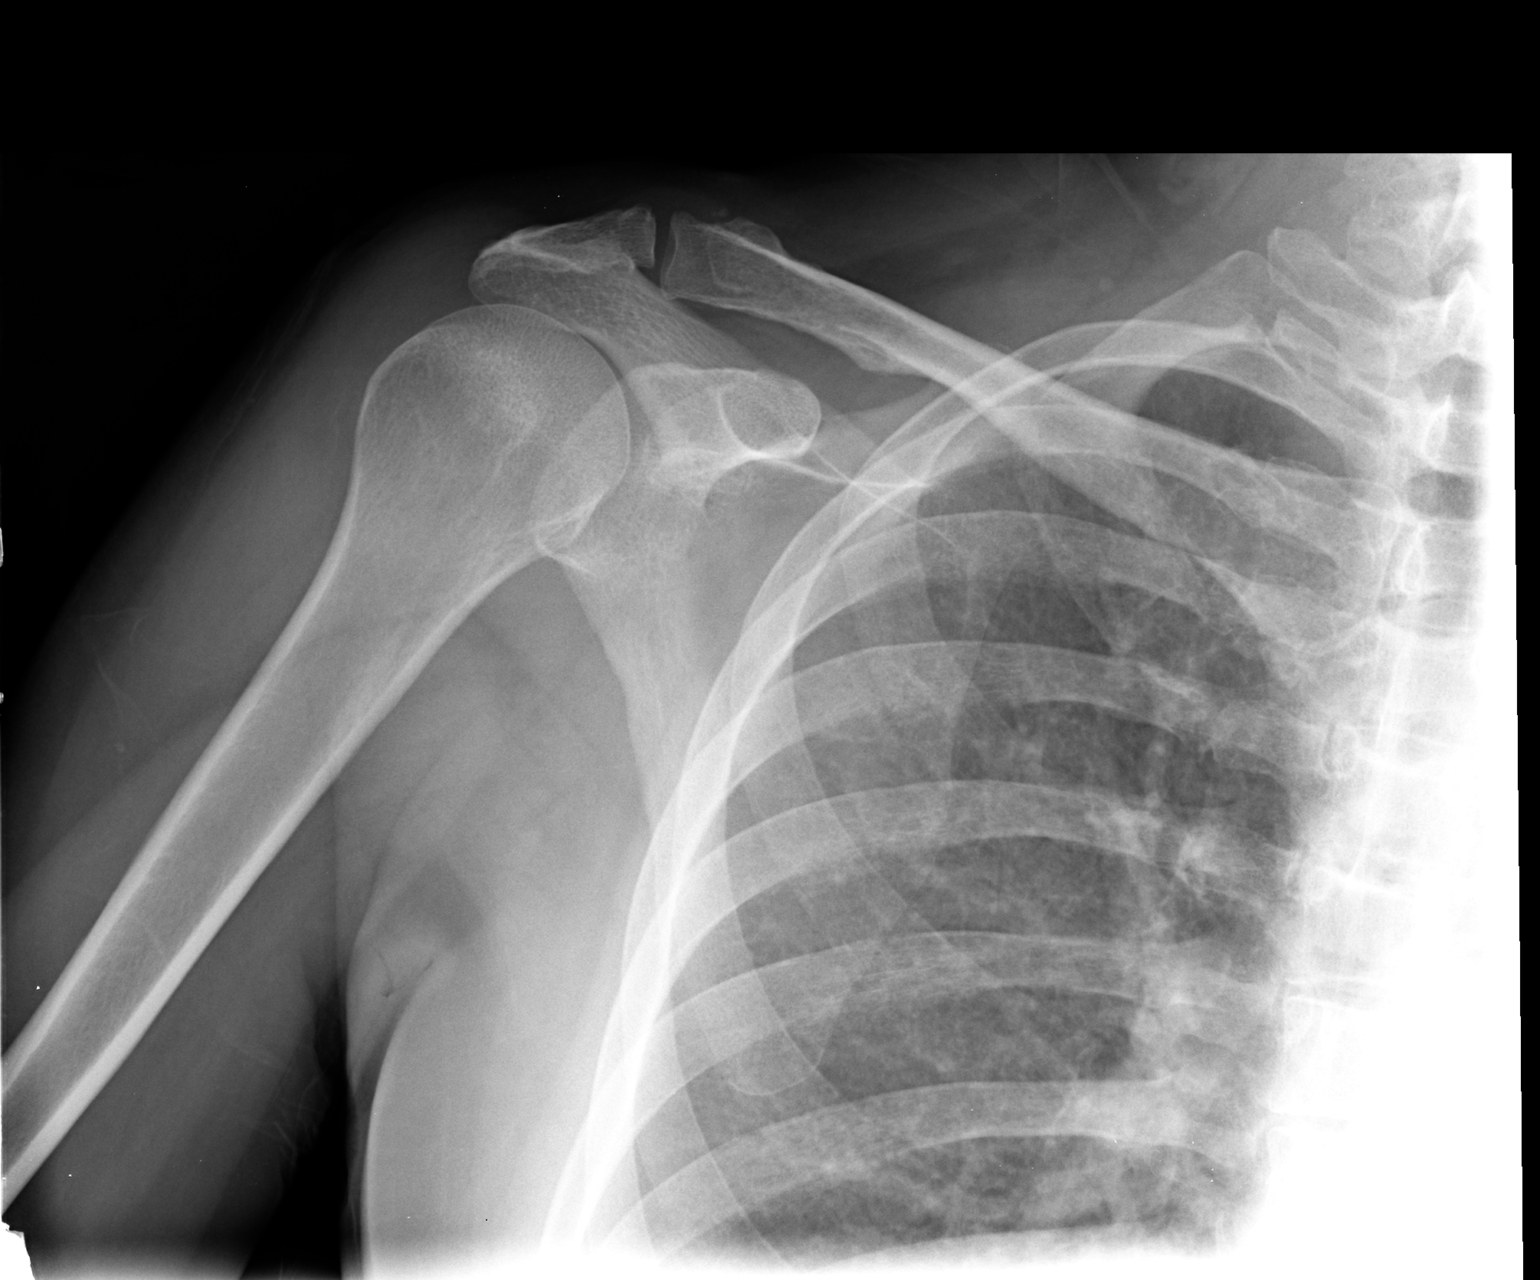

[view not recorded (2 of 3)]
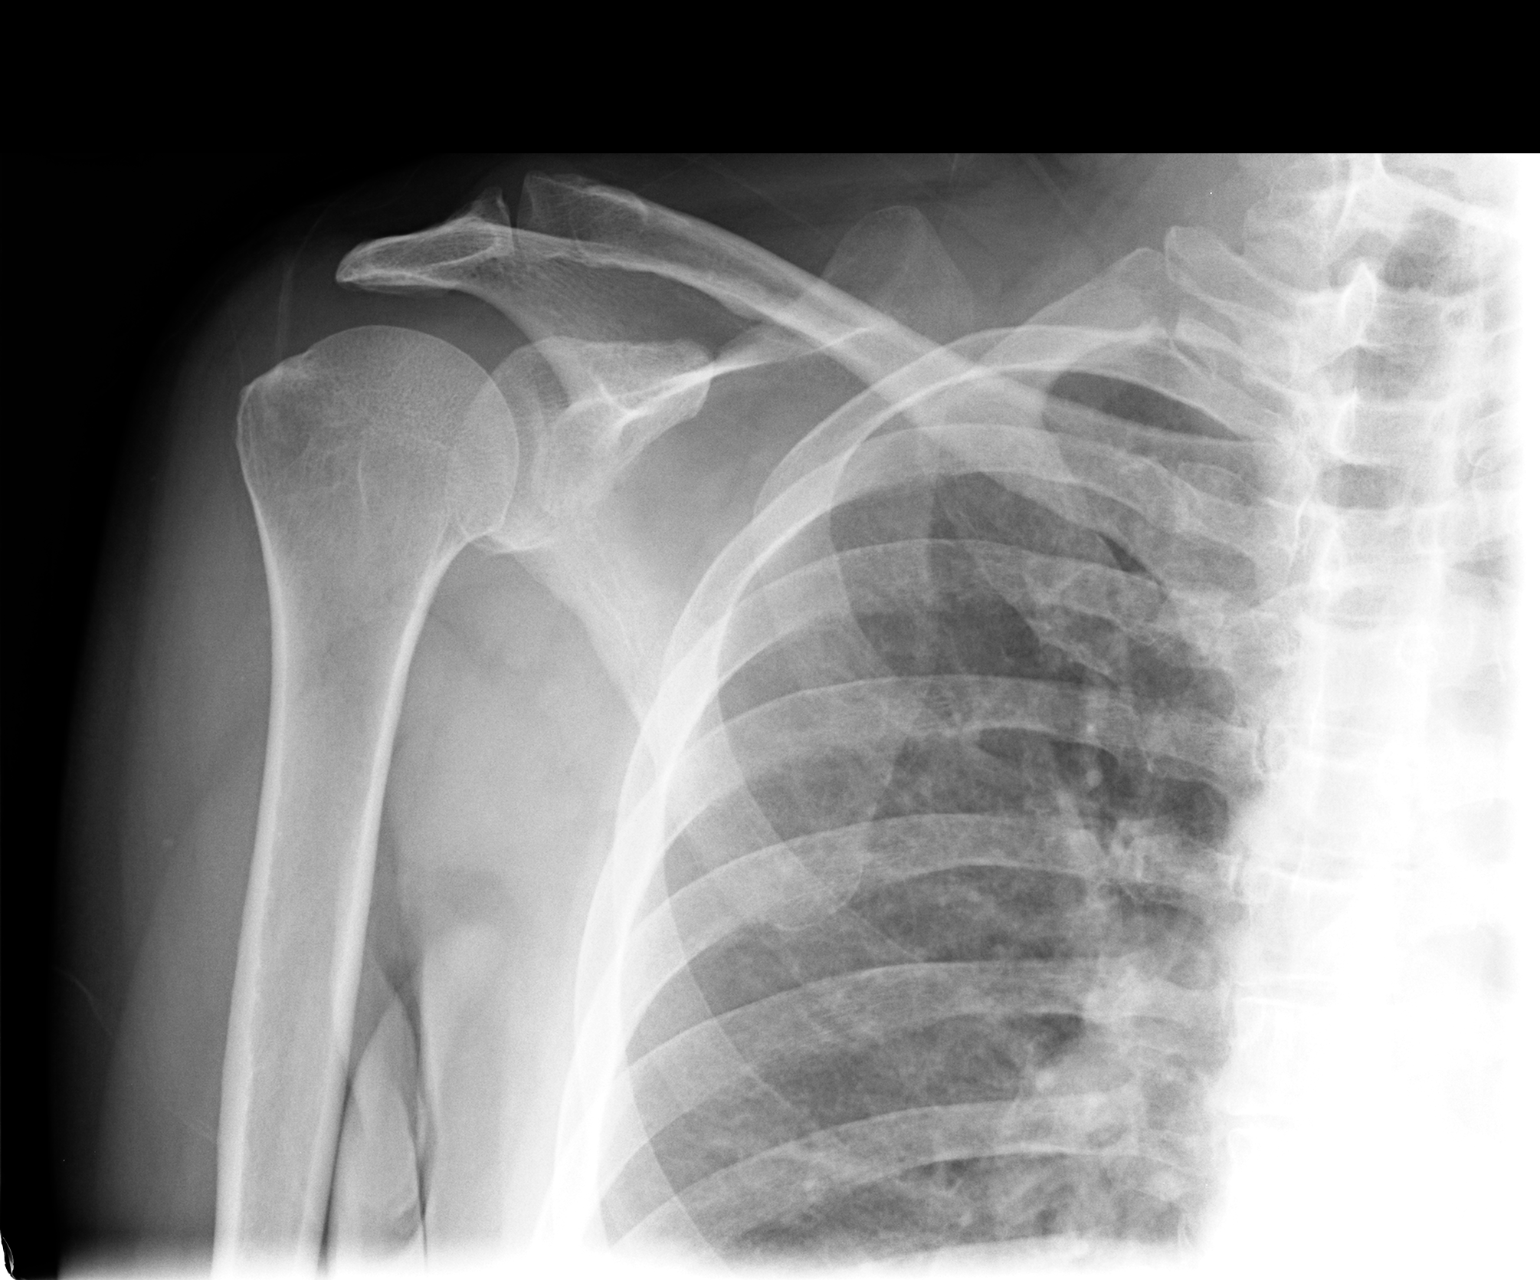

[view not recorded (3 of 3)]
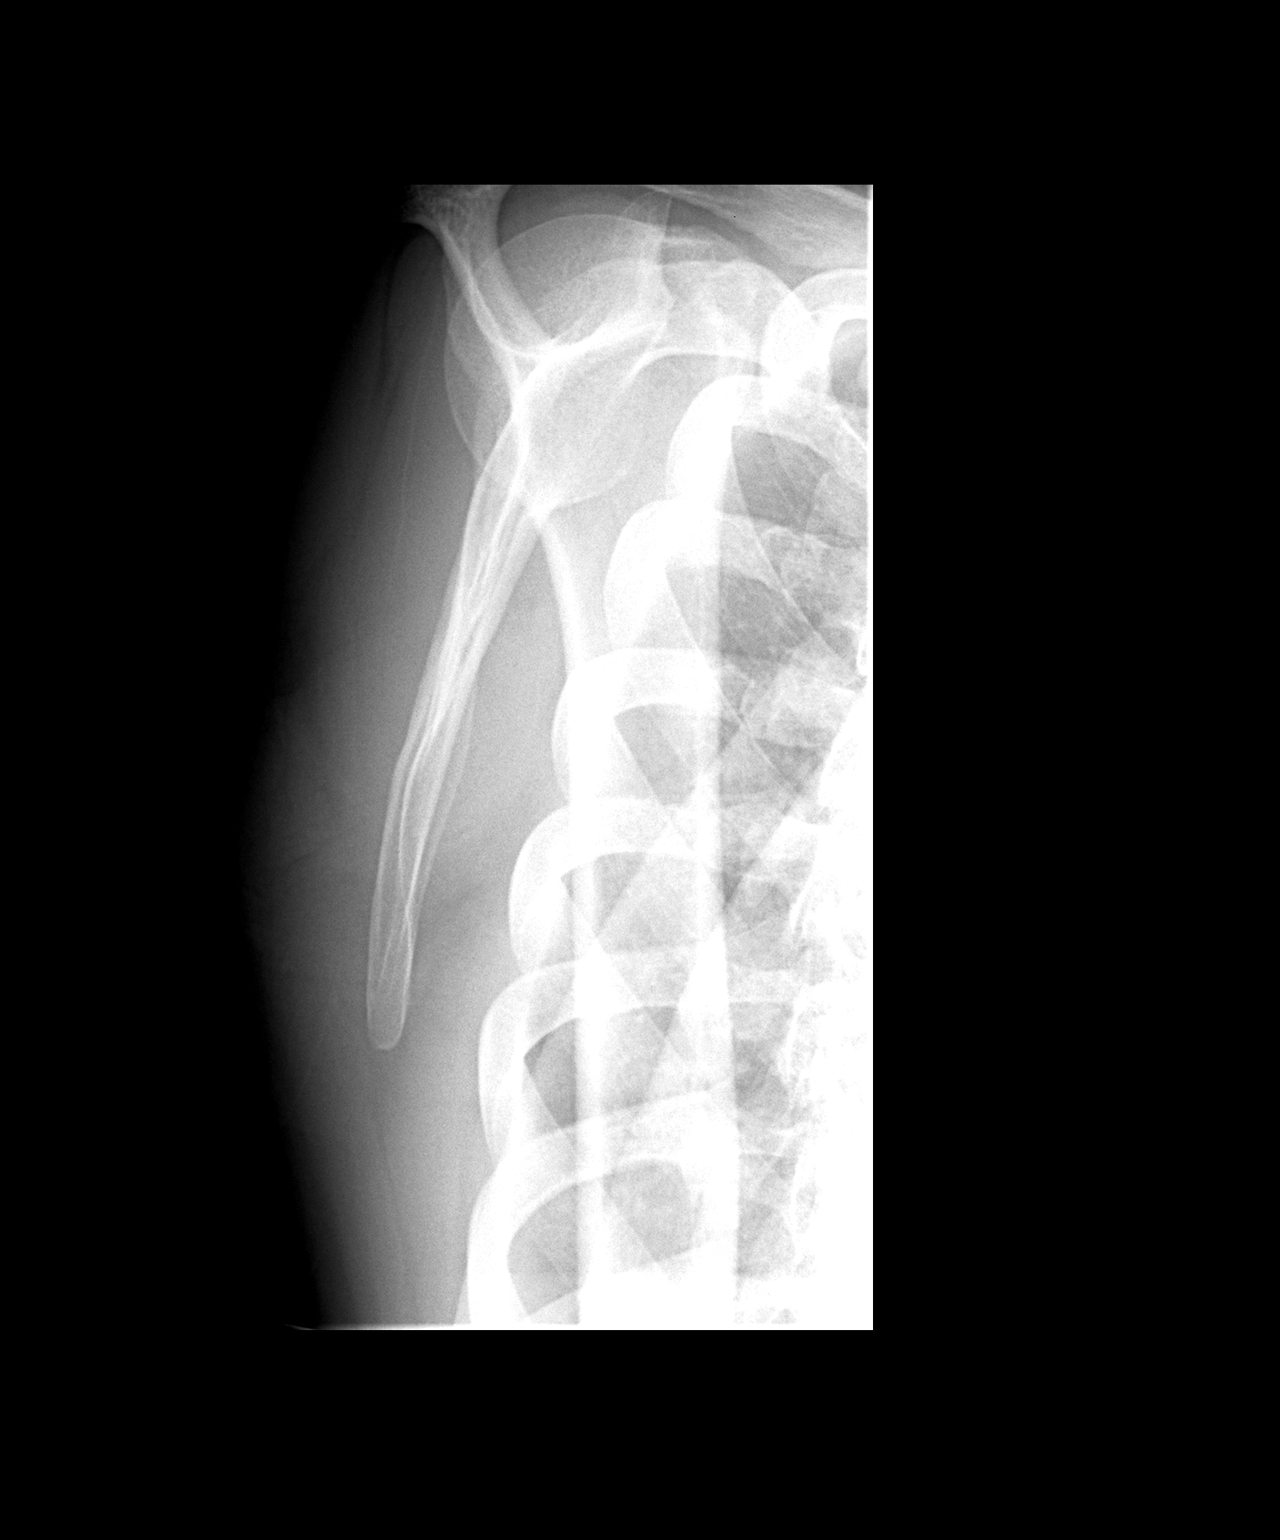

[3 of 3 positions shown; findings below may reference images not displayed]

FINDINGS: Negative for fracture, dislocation, or other acute
abnormality.  Normal alignment and mineralization. No significant
degenerative change.  Regional soft tissues unremarkable.
IMPRESSION: Negative

## 2014-02-05 IMAGING — CR DG HAND COMPLETE 3+V*R*
3 series · 3 of 3 positions shown · non-contrast
Comparison: 08/04/2006

CLINICAL DATA: Pain post fall.

RIGHT HAND - COMPLETE 3+ VIEW

[view not recorded (1 of 3)]
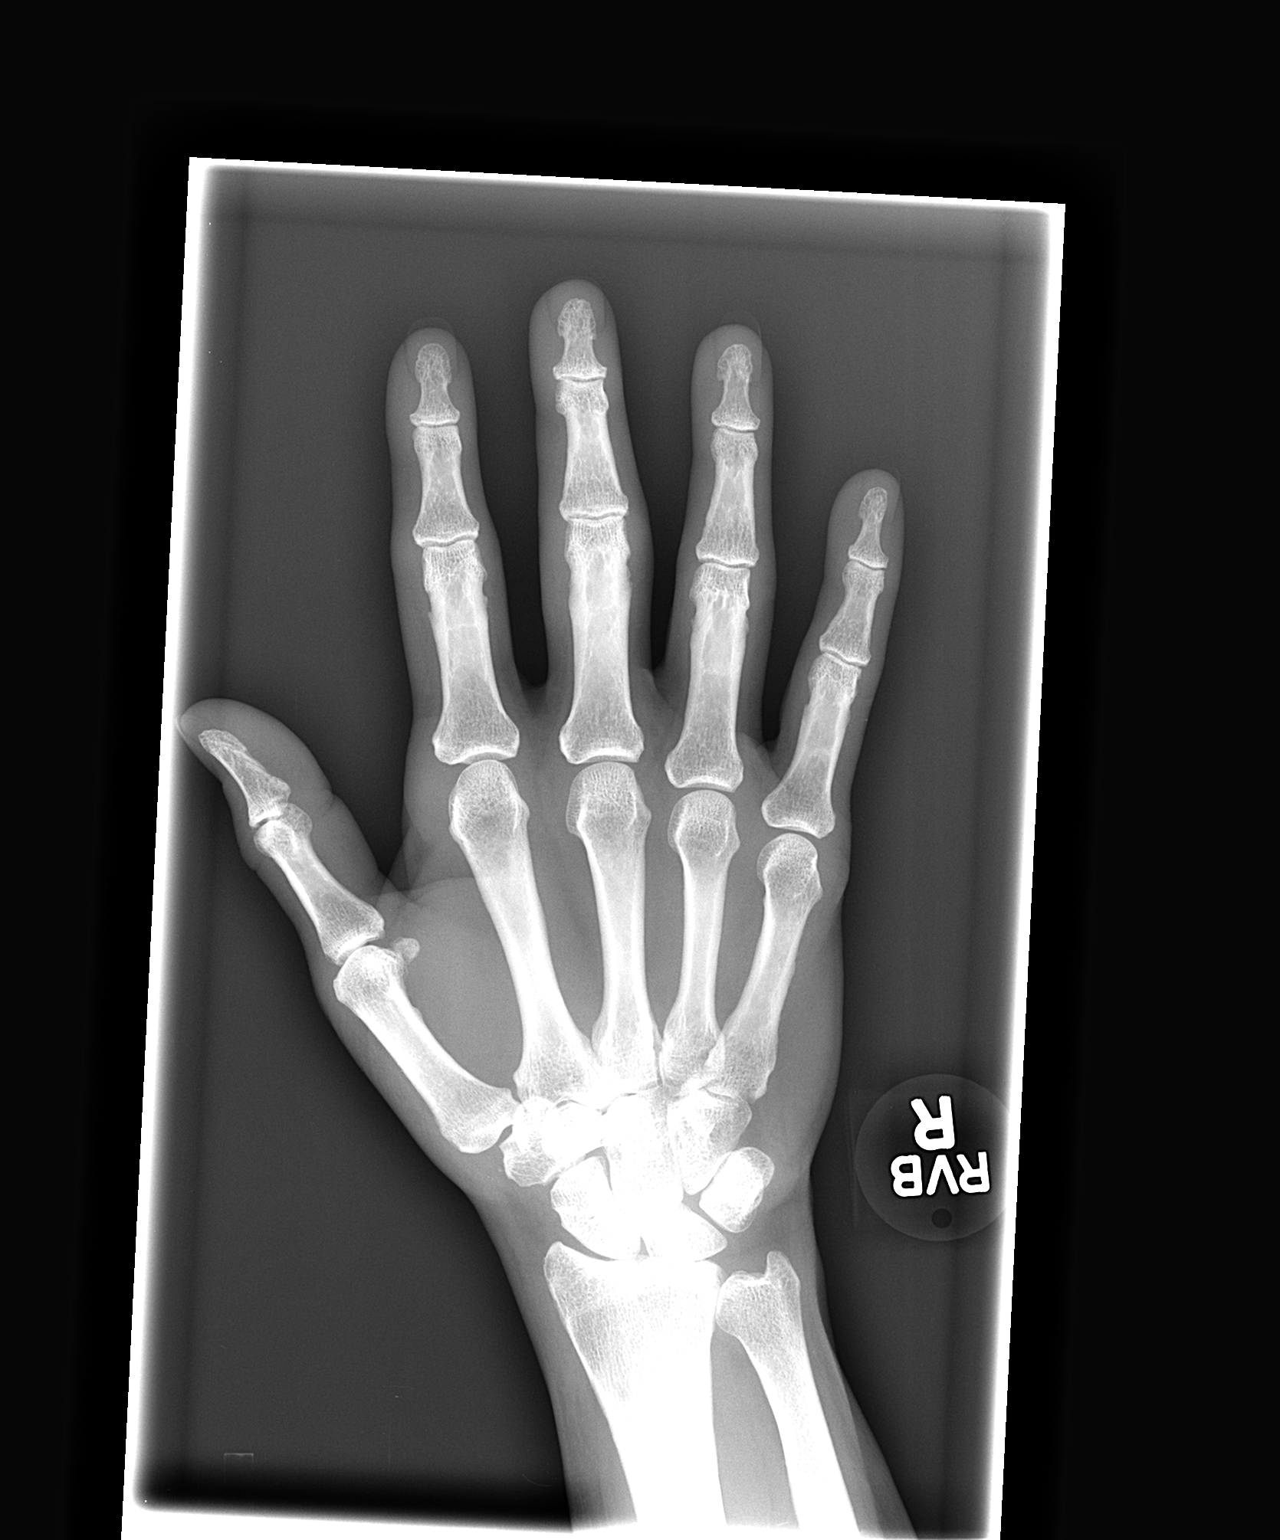

[view not recorded (2 of 3)]
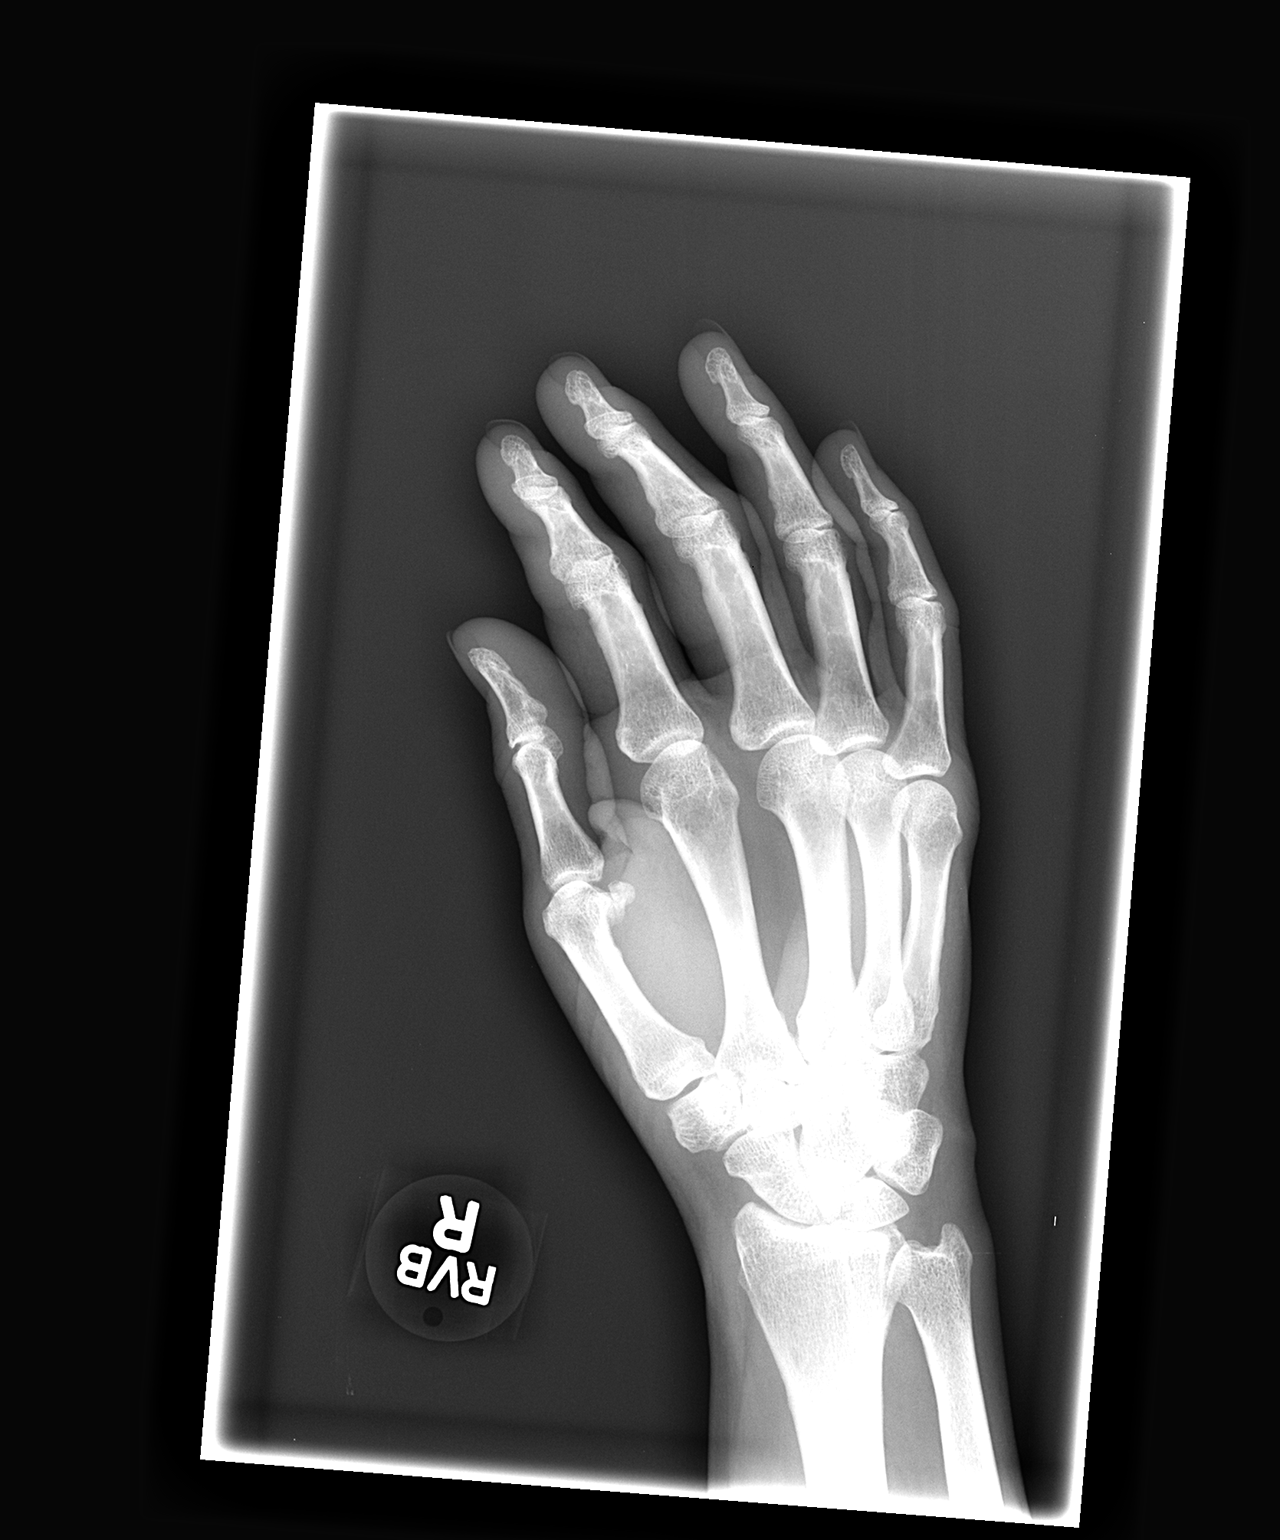

[view not recorded (3 of 3)]
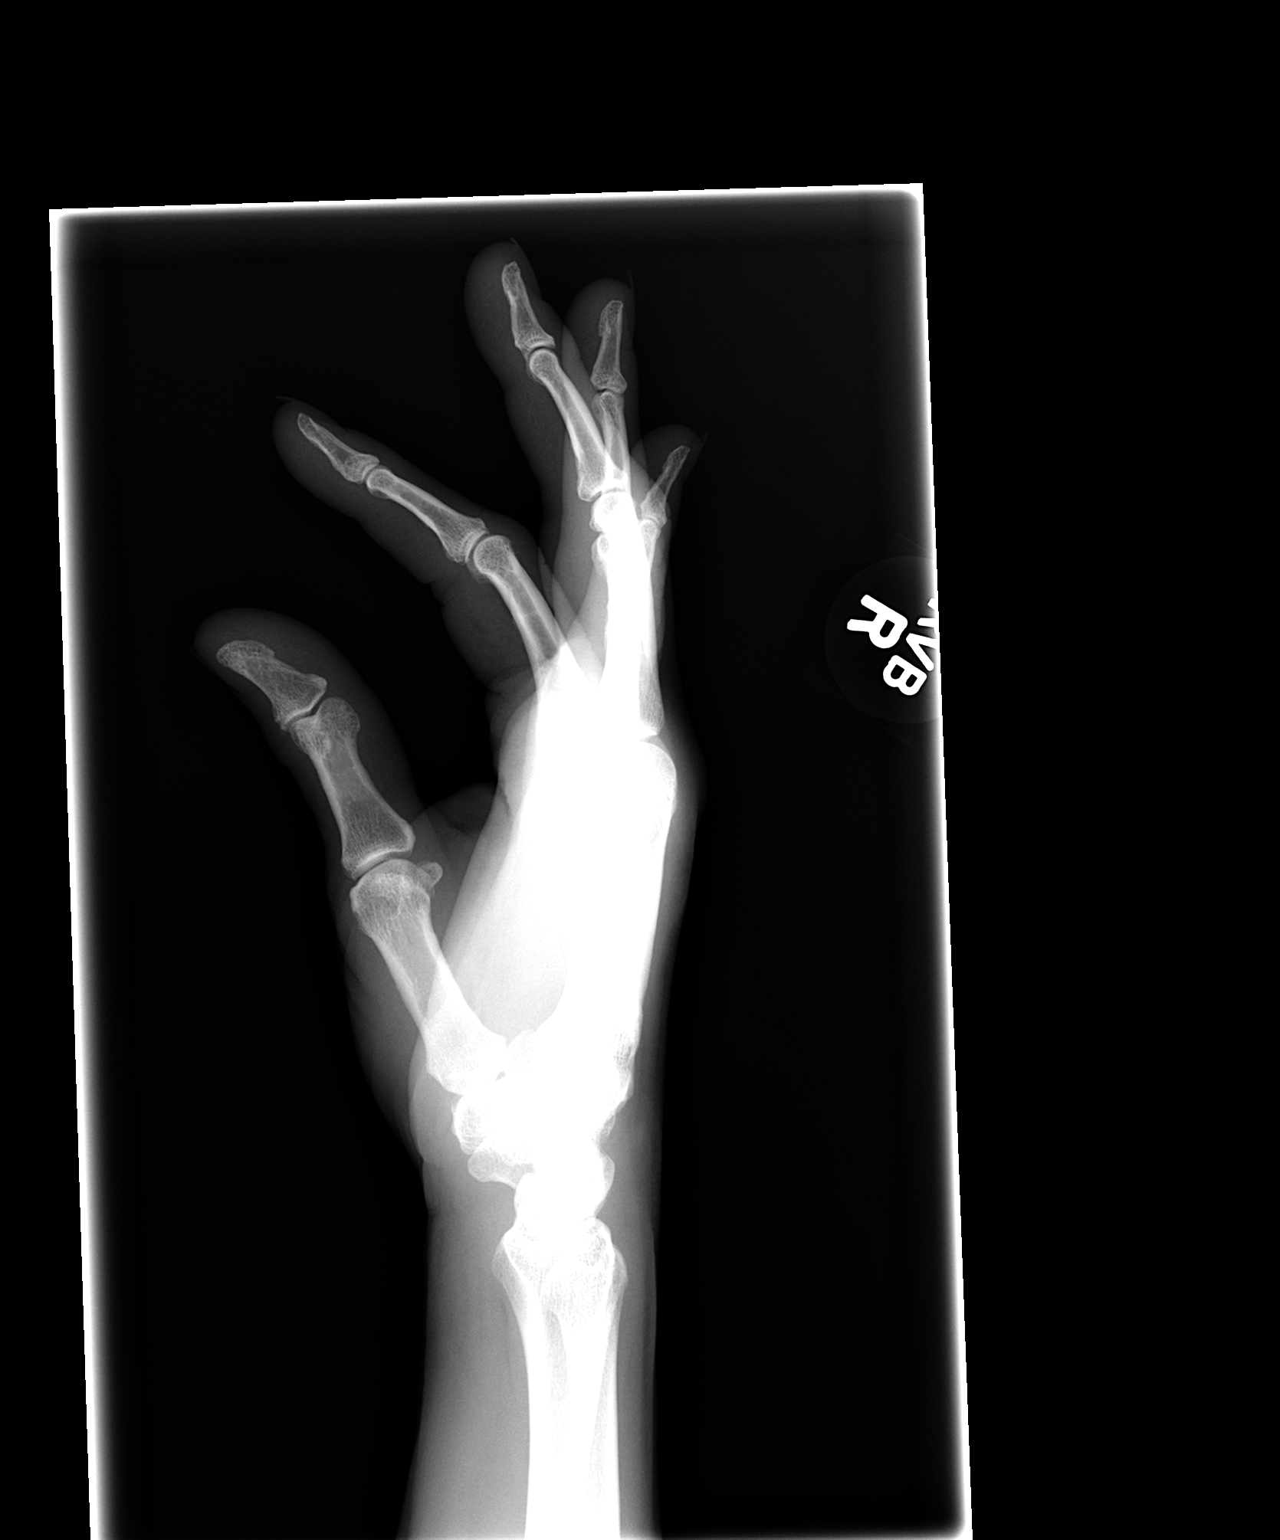

[3 of 3 positions shown; findings below may reference images not displayed]

FINDINGS: Negative for fracture, dislocation, or other acute
abnormality.  Normal alignment and mineralization. No significant
degenerative change.  Regional soft tissues unremarkable.
IMPRESSION: Negative

## 2014-02-05 IMAGING — CR DG ELBOW COMPLETE 3+V*R*
2 series · 2 of 2 positions shown · non-contrast
Comparison: None.

CLINICAL DATA: Pain post fall.

RIGHT ELBOW - COMPLETE 3+ VIEW

[view not recorded (1 of 2)]
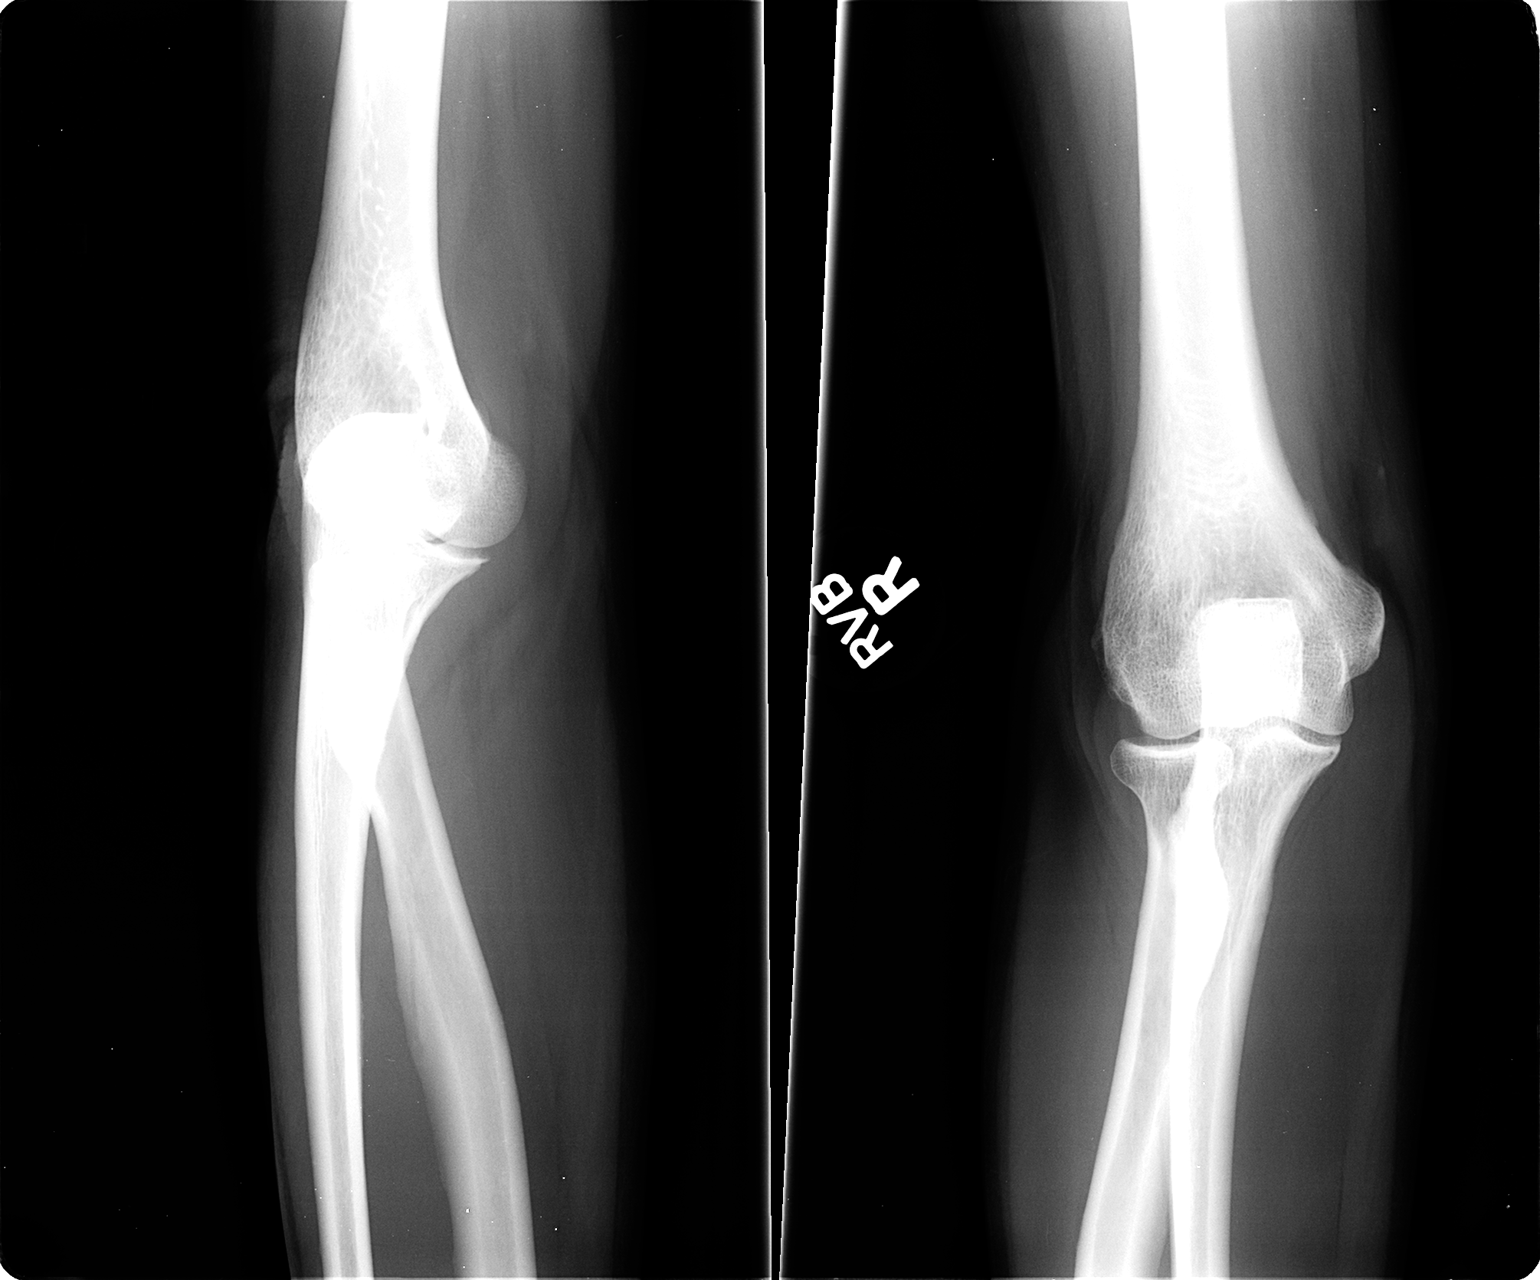

[view not recorded (2 of 2)]
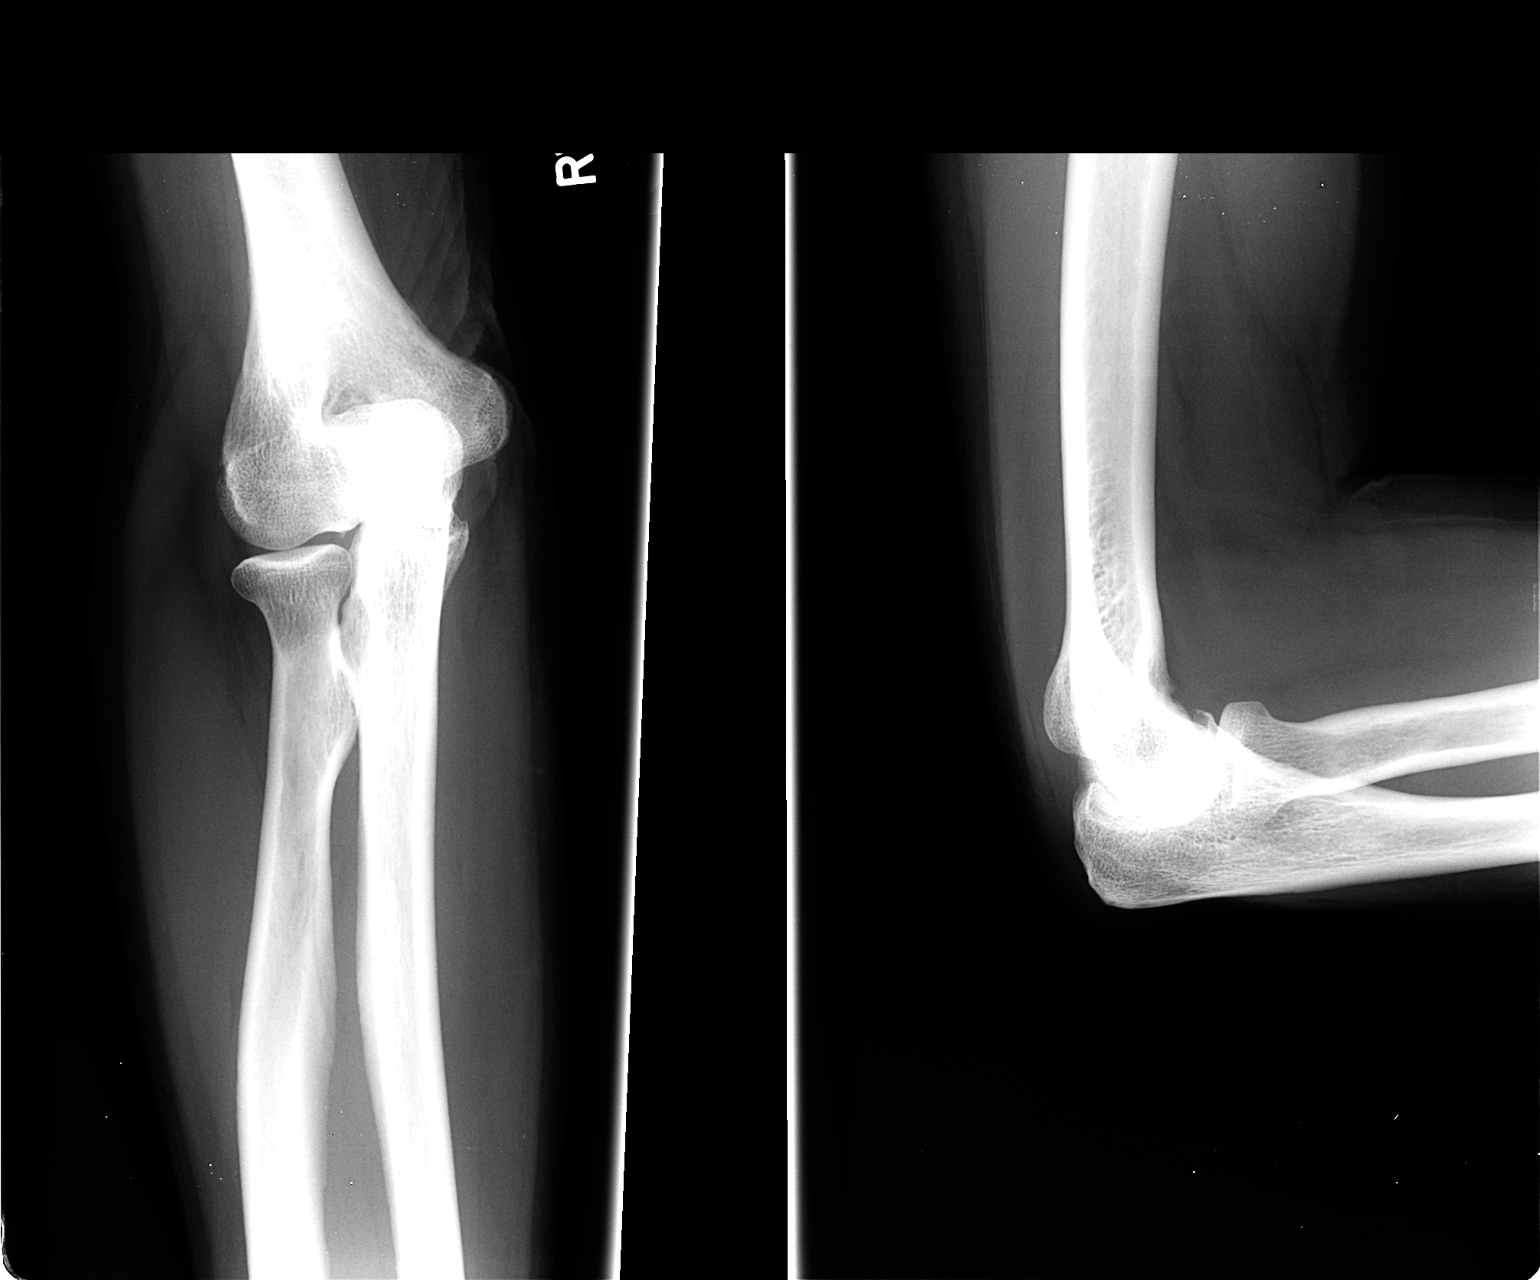

[2 of 2 positions shown; findings below may reference images not displayed]

FINDINGS: No effusion. Negative for fracture, dislocation, or other
acute abnormality.  Normal alignment and mineralization. No
significant degenerative change.  Regional soft tissues
unremarkable.
IMPRESSION: Negative
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
## 2014-02-05 IMAGING — CR DG HUMERUS 2V *R*
2 series · 2 of 2 positions shown · non-contrast
Comparison: 08/29/2009

CLINICAL DATA: Pain post fall.

RIGHT HUMERUS - 2+ VIEW

[view not recorded (1 of 2)]
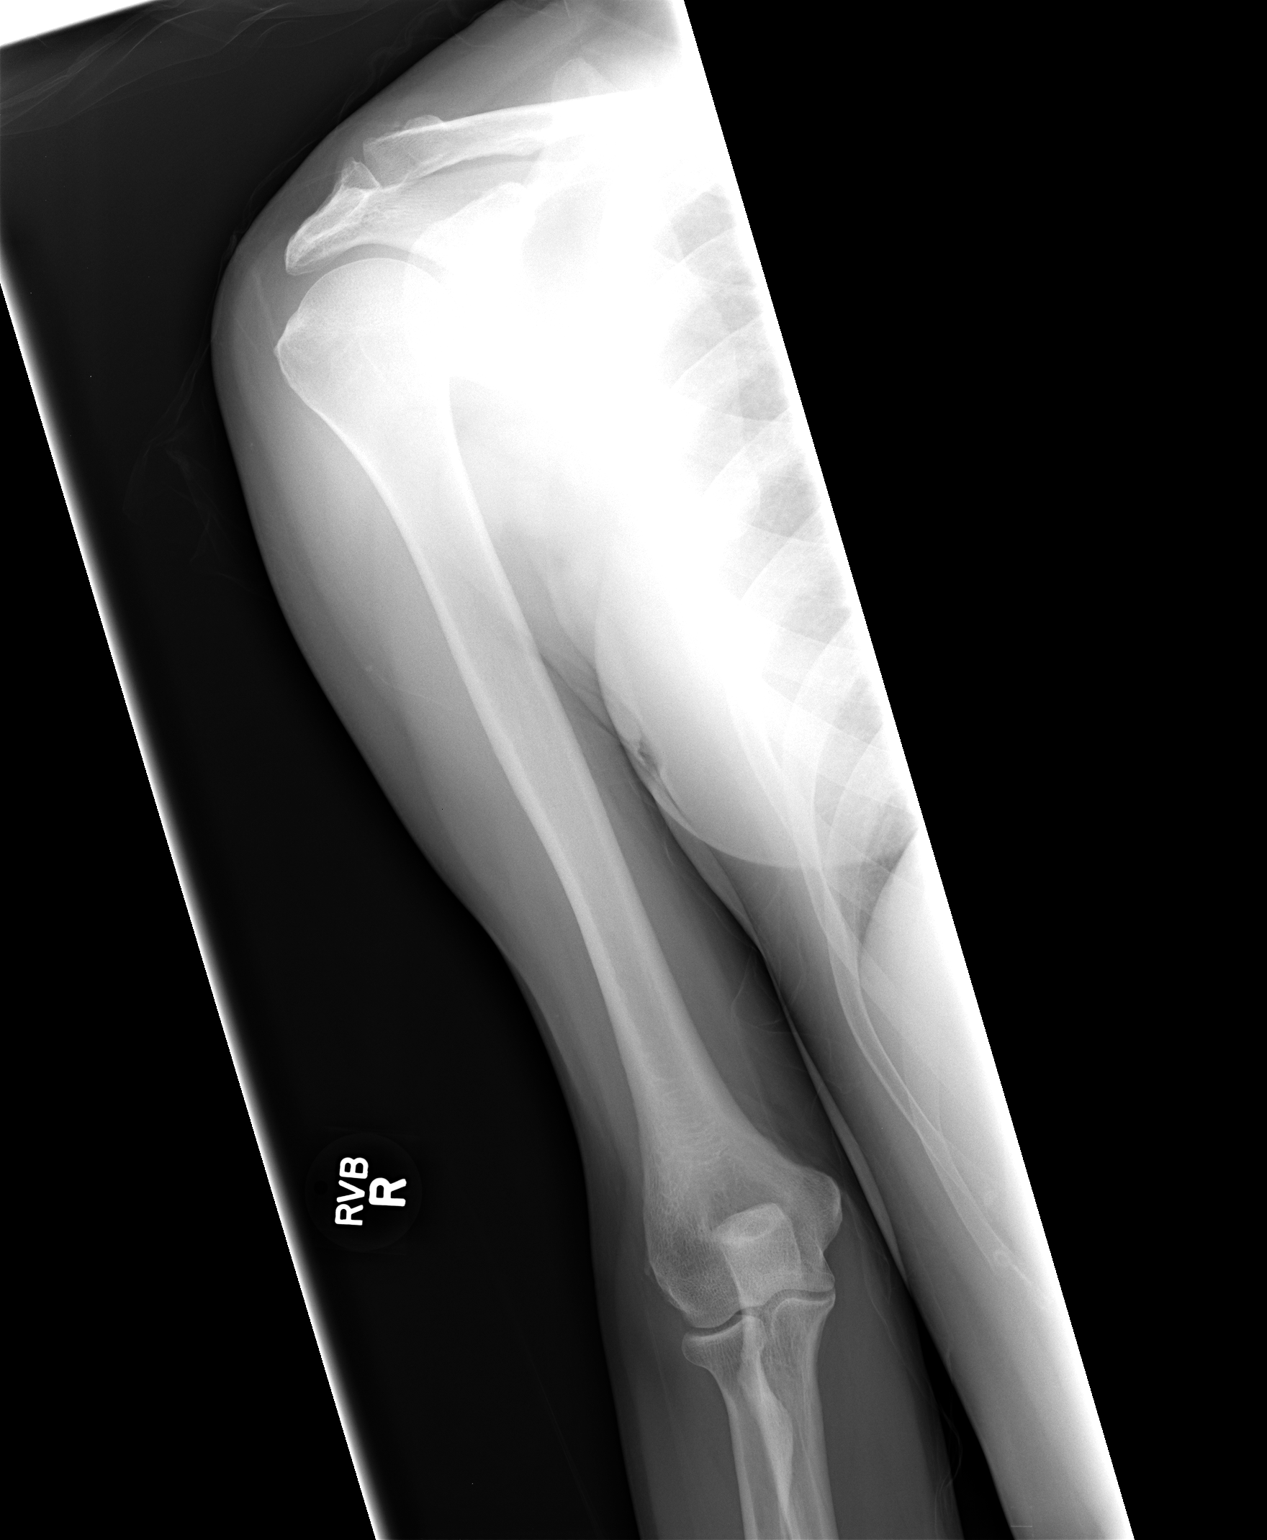

[view not recorded (2 of 2)]
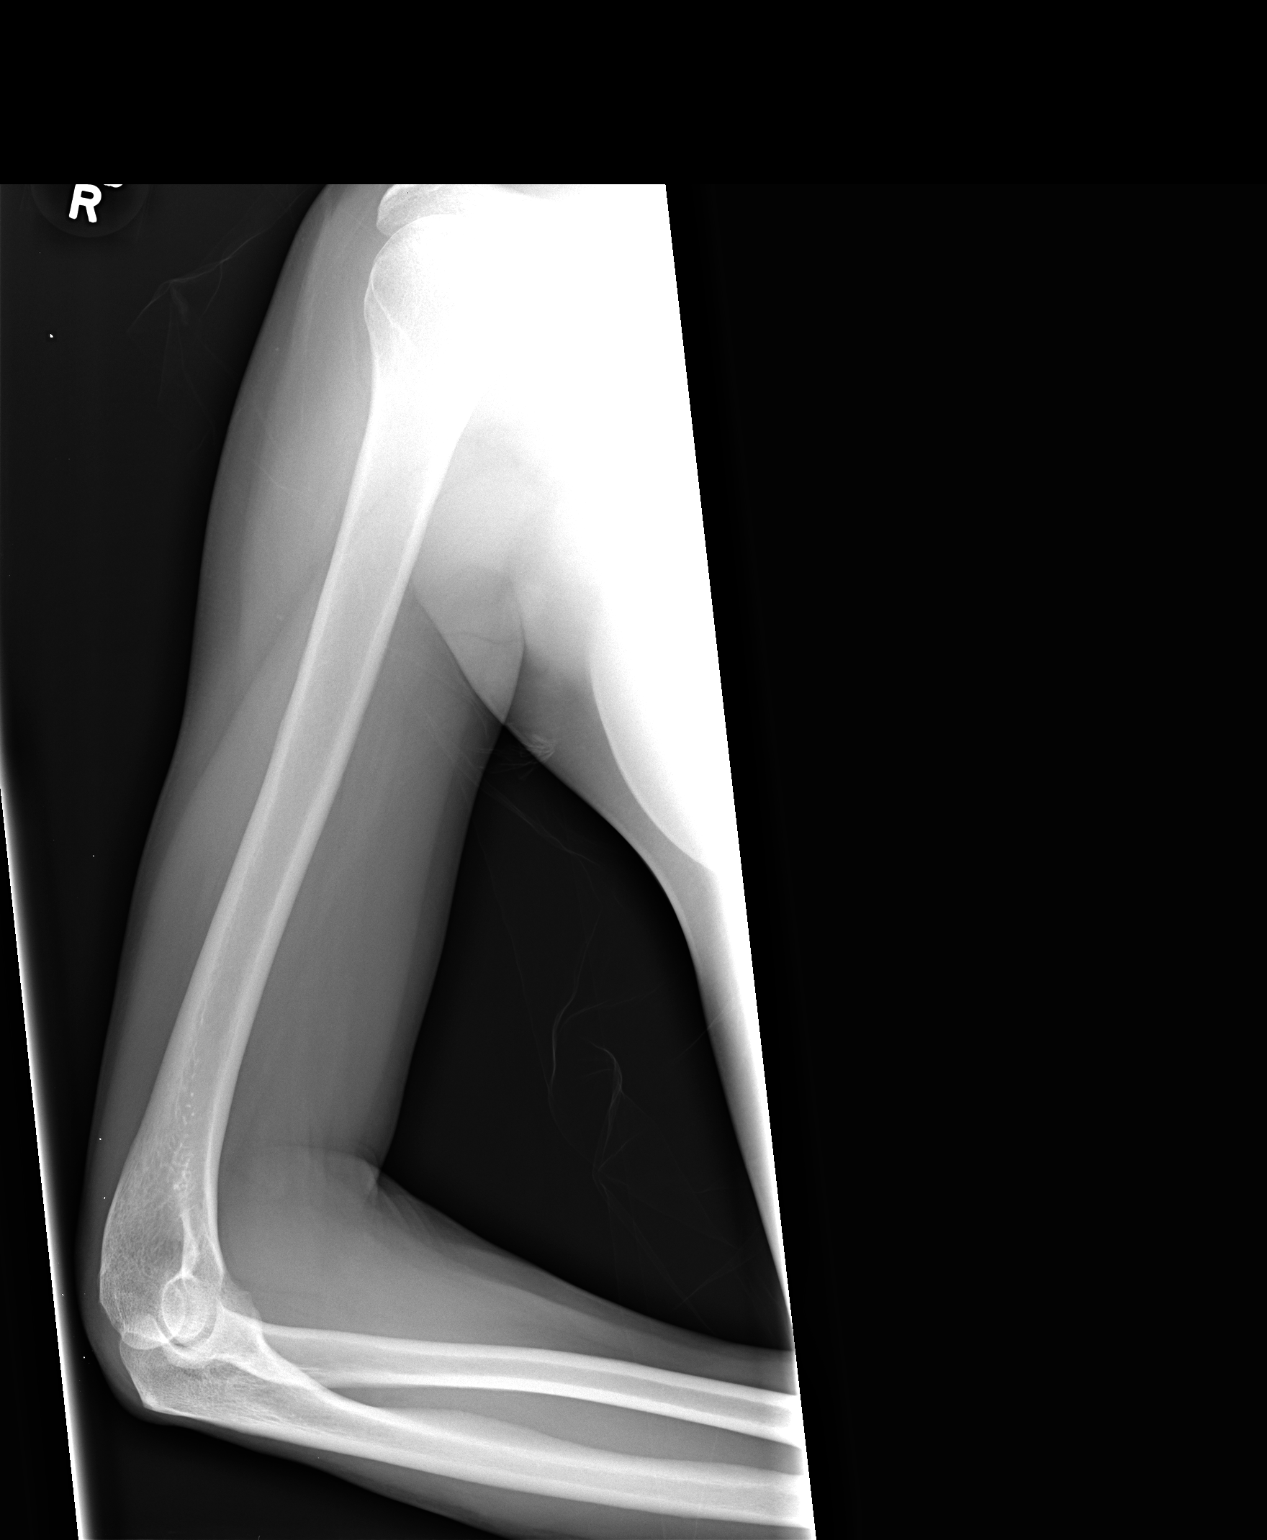

[2 of 2 positions shown; findings below may reference images not displayed]

FINDINGS: Negative for fracture, dislocation, or other acute
abnormality.  Normal alignment and mineralization. No significant
degenerative change.  Regional soft tissues unremarkable.
IMPRESSION: Negative

## 2014-02-08 IMAGING — CT CT ABD-PELV W/ CM
2 of 4 series · 15 of 46 positions shown, 17 images · IV contrast (omnipaque)
Comparison: 11/10/2011, 09/19/2010

CLINICAL DATA: Right lower quadrant abdominal pain, nausea.
Clinical concern for appendicitis.

CT ABDOMEN AND PELVIS WITH CONTRAST
TECHNIQUE: Multidetector CT imaging of the abdomen and pelvis was
performed following the standard protocol during bolus
administration of intravenous contrast.
Contrast: 50mL OMNIPAQUE IOHEXOL 300 MG/ML  SOLN, 100mL OMNIPAQUE
IOHEXOL 300 MG/ML  SOLN

[Series 2: abd_pel_with 5.0 b40f · axial · 0.74mm/px · z∈[+416,+826]mm · 12 of 92 slices shown, 14 images]
[im 5/92  soft-tissue]
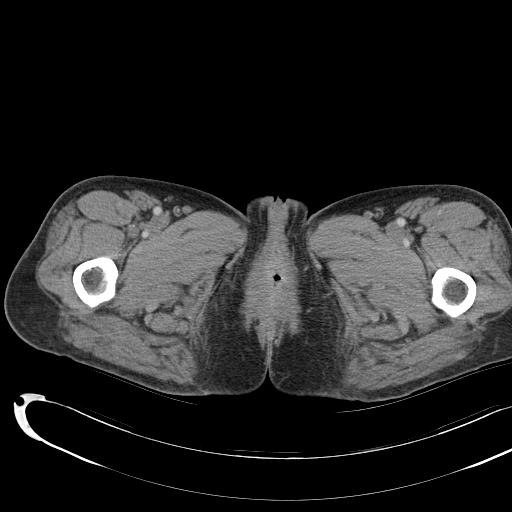
[im 5/92  bone]
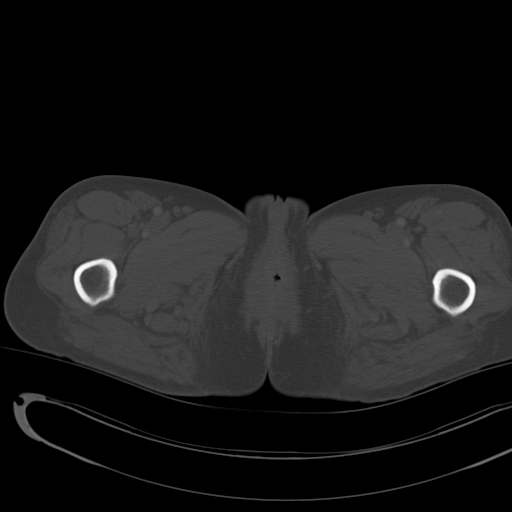
[im 14/92  soft-tissue]
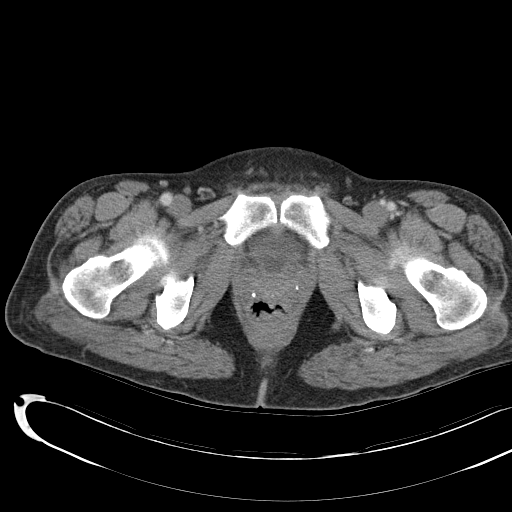
[im 22/92  soft-tissue]
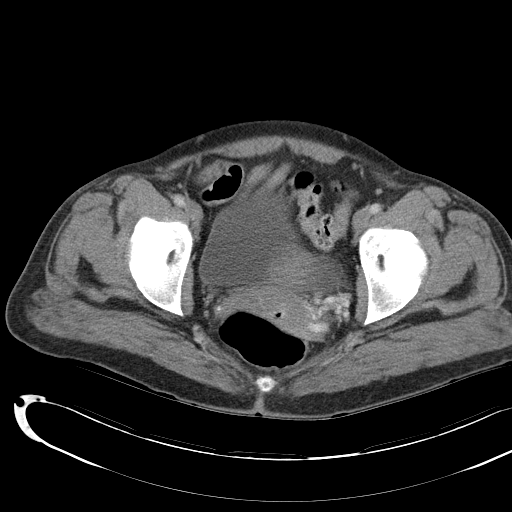
[im 27/92  soft-tissue]
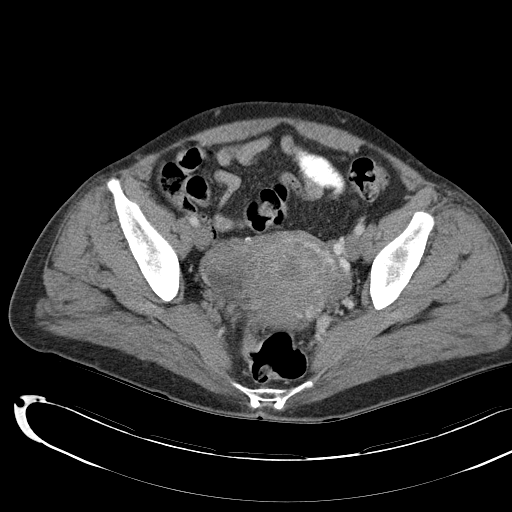
[im 35/92  soft-tissue]
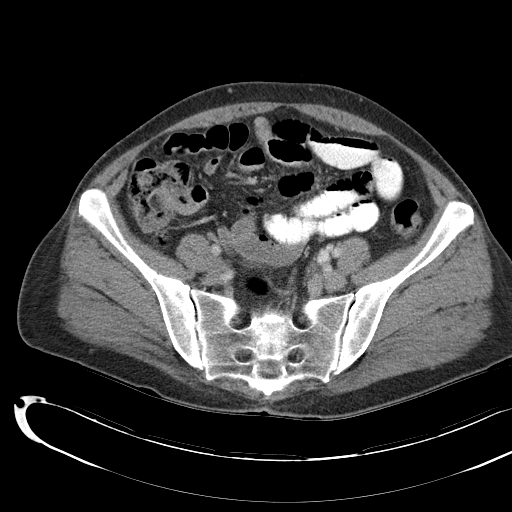
[im 44/92  soft-tissue]
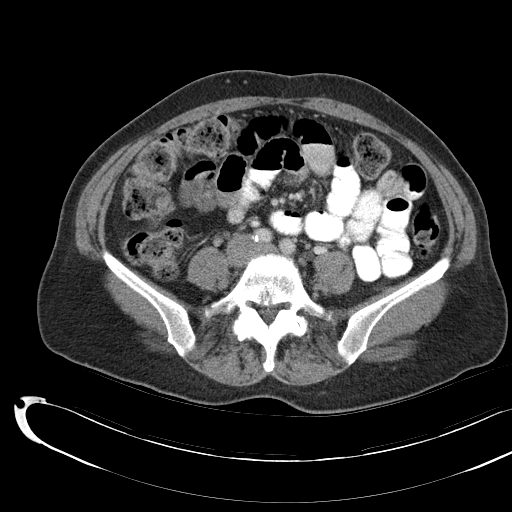
[im 48/92  soft-tissue]
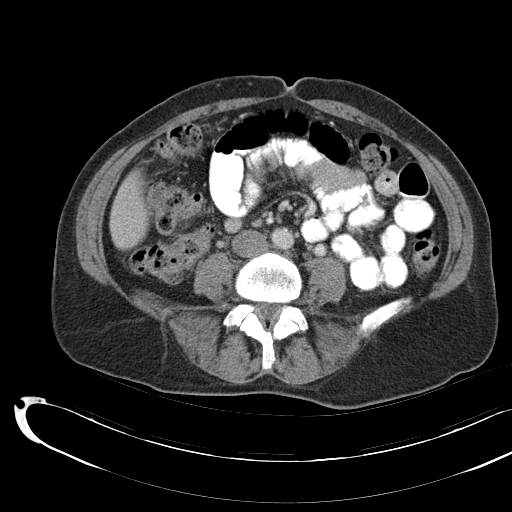
[im 57/92  soft-tissue]
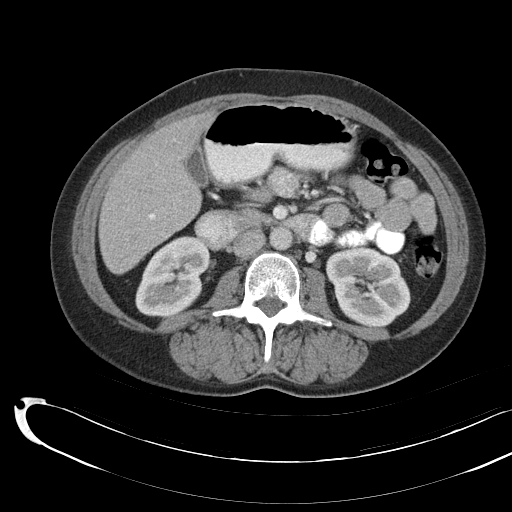
[im 66/92  soft-tissue]
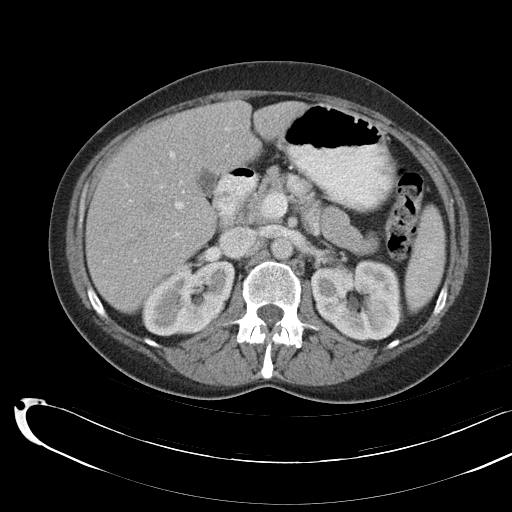
[im 66/92  bone]
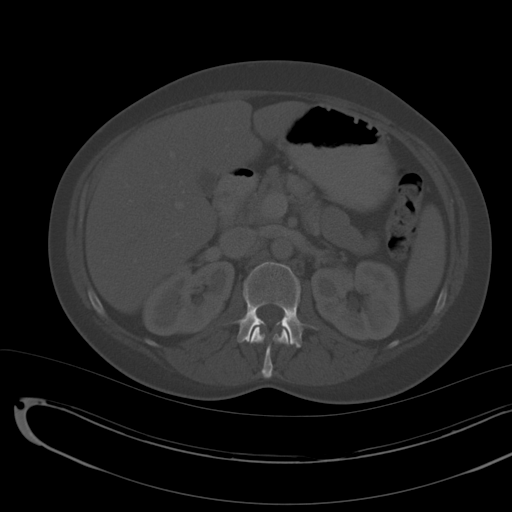
[im 70/92  soft-tissue]
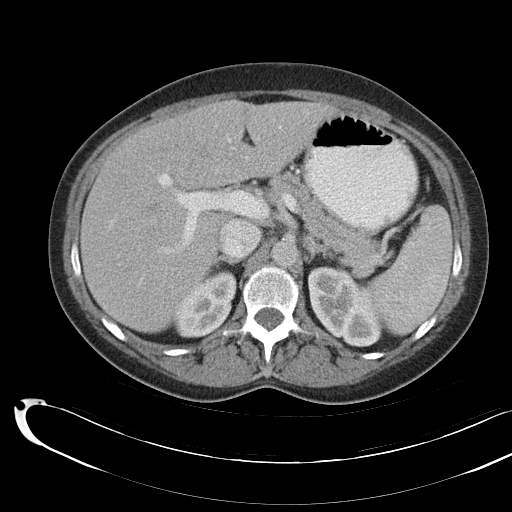
[im 79/92  soft-tissue]
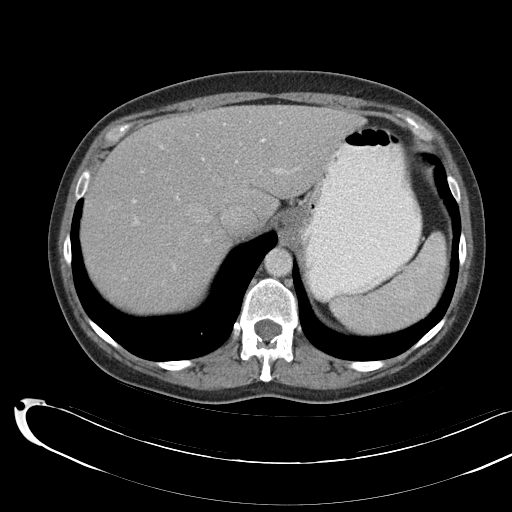
[im 87/92  soft-tissue]
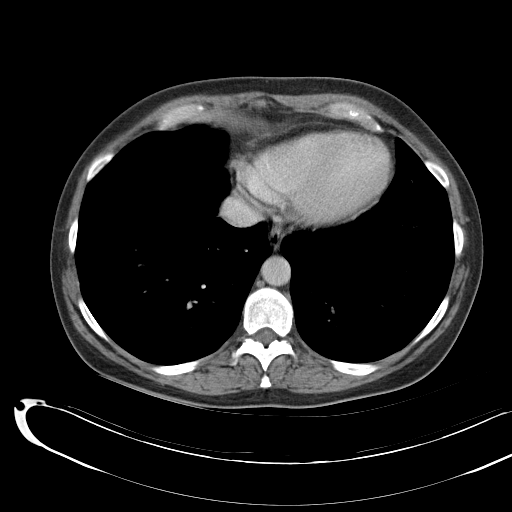

[Series 5: abd_pel_with 3.0 spo cor · coronal · 0.60mm/px · 3 of 79 slices shown]
[im 27/79  soft-tissue]
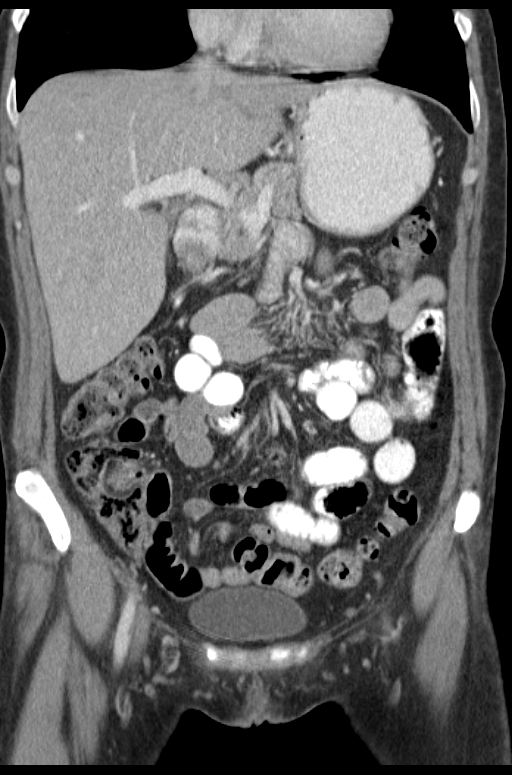
[im 35/79  soft-tissue]
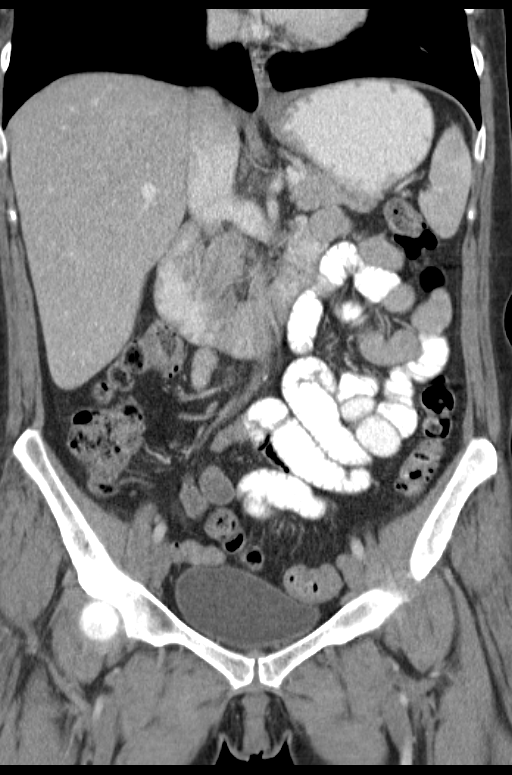
[im 44/79  soft-tissue]
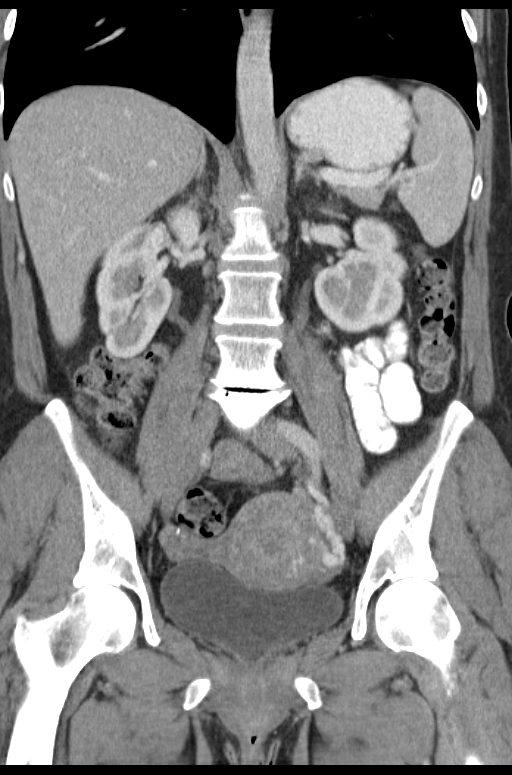

[15 of 46 positions shown; findings below may reference images not displayed]

FINDINGS: 7 mm right lower lobe pulmonary nodule again noted,
stable since prior exam 09/19/2010. Lung bases are clear.

Liver, gallbladder, adrenal glands, kidneys, spleen, and pancreas
are normal. No ascites or free air.

Trace right pelvic cul-de-sac and right adnexal free fluid noted.
Uterus and ovaries are normal.  The appendix is normal, image 66.
No bowel wall thickening is identified.  There are several mid
abdominal loops of jejunum which are mildly prominent measuring
cm maximally, but without wall thickening, overt dilatation, or
evidence for intrinsic or extrinsic mass lesion.  Gradual tapering
to normal caliber terminal ileum.

No acute osseous finding.
IMPRESSION: Trace free pelvic fluid in the right cul-de-sac and adjacent to the
right adnexa, which could suggest recent follicle rupture which
could be symptomatic.

No other etiology for the history of abdominal pain is identified;
specifically, the appendix is normal.

7 mm stable right lower lobe pulmonary nodule.  One final chest CT
is recommended September 2012 to establish 2-year stability and
establish probable benignity.

## 2014-02-24 ENCOUNTER — Emergency Department (HOSPITAL_COMMUNITY)
Admission: EM | Admit: 2014-02-24 | Discharge: 2014-02-24 | Disposition: A | Discharge status: Home / Self Care | Payer: Self-pay | Attending: Emergency Medicine | Admitting: Emergency Medicine

## 2014-02-24 ENCOUNTER — Encounter (HOSPITAL_COMMUNITY): Payer: Self-pay | Admitting: *Deleted

## 2014-02-24 DIAGNOSIS — M1990 Unspecified osteoarthritis, unspecified site: Secondary | ICD-10-CM | POA: Insufficient documentation

## 2014-02-24 DIAGNOSIS — Z7982 Long term (current) use of aspirin: Secondary | ICD-10-CM | POA: Insufficient documentation

## 2014-02-24 DIAGNOSIS — Z720 Tobacco use: Secondary | ICD-10-CM | POA: Insufficient documentation

## 2014-02-24 DIAGNOSIS — I10 Essential (primary) hypertension: Secondary | ICD-10-CM | POA: Insufficient documentation

## 2014-02-24 DIAGNOSIS — M069 Rheumatoid arthritis, unspecified: Secondary | ICD-10-CM | POA: Insufficient documentation

## 2014-02-24 DIAGNOSIS — M2550 Pain in unspecified joint: Secondary | ICD-10-CM

## 2014-02-24 DIAGNOSIS — A599 Trichomoniasis, unspecified: Secondary | ICD-10-CM

## 2014-02-24 DIAGNOSIS — A5901 Trichomonal vulvovaginitis: Secondary | ICD-10-CM | POA: Insufficient documentation

## 2014-02-24 DIAGNOSIS — D649 Anemia, unspecified: Secondary | ICD-10-CM | POA: Insufficient documentation

## 2014-02-24 DIAGNOSIS — Z7952 Long term (current) use of systemic steroids: Secondary | ICD-10-CM | POA: Insufficient documentation

## 2014-02-24 DIAGNOSIS — R000 Tachycardia, unspecified: Secondary | ICD-10-CM | POA: Insufficient documentation

## 2014-02-24 LAB — URINALYSIS, ROUTINE W REFLEX MICROSCOPIC
Bilirubin Urine: NEGATIVE
GLUCOSE, UA: NEGATIVE mg/dL
Ketones, ur: NEGATIVE mg/dL
Nitrite: NEGATIVE
Protein, ur: NEGATIVE mg/dL
Specific Gravity, Urine: <1.005 — ABNORMAL LOW (ref 1.005–1.030)
Urobilinogen, UA: 0.2 mg/dL (ref 0.0–1.0)
pH: 5.5 (ref 5.0–8.0)

## 2014-02-24 LAB — WET PREP, GENITAL: YEAST WET PREP: NONE SEEN

## 2014-02-24 LAB — URINE MICROSCOPIC-ADD ON

## 2014-02-24 MED ORDER — OXYCODONE-ACETAMINOPHEN 5-325 MG PO TABS: 1.0000 | ORAL_TABLET | Freq: Four times a day (QID) | ORAL | Status: DC | PRN

## 2014-02-24 MED ORDER — PROMETHAZINE HCL 12.5 MG PO TABS: 12.5000 mg | ORAL_TABLET | Freq: Four times a day (QID) | ORAL | Status: DC | PRN

## 2014-02-24 MED ORDER — PROMETHAZINE HCL 12.5 MG PO TABS
25.0000 mg | ORAL_TABLET | Freq: Once | ORAL | Status: AC
  Administered 2014-02-24: 25 mg via ORAL
  Filled 2014-02-24: qty 2

## 2014-02-24 MED ORDER — OXYCODONE-ACETAMINOPHEN 5-325 MG PO TABS
1.0000 | ORAL_TABLET | Freq: Once | ORAL | Status: AC
  Administered 2014-02-24: 1 via ORAL
  Filled 2014-02-24: qty 1

## 2014-02-24 MED ORDER — METRONIDAZOLE 500 MG PO TABS: 500.0000 mg | ORAL_TABLET | Freq: Two times a day (BID) | ORAL | Status: DC

## 2014-02-24 MED ORDER — FAMOTIDINE 20 MG PO TABS
20.0000 mg | ORAL_TABLET | Freq: Once | ORAL | Status: AC
  Administered 2014-02-24: 20 mg via ORAL
  Filled 2014-02-24: qty 1

## 2014-02-24 MED ORDER — FAMOTIDINE 20 MG PO TABS
ORAL_TABLET | ORAL | Status: AC
  Filled 2014-02-24: qty 1

## 2014-02-24 MED ORDER — HYDROMORPHONE HCL 1 MG/ML IJ SOLN
1.0000 mg | Freq: Once | INTRAMUSCULAR | Status: AC
  Administered 2014-02-24: 1 mg via INTRAMUSCULAR
  Filled 2014-02-24: qty 1

## 2014-02-24 MED ORDER — KETOROLAC TROMETHAMINE 60 MG/2ML IM SOLN
30.0000 mg | Freq: Once | INTRAMUSCULAR | Status: AC
  Administered 2014-02-24: 30 mg via INTRAMUSCULAR
  Filled 2014-02-24: qty 2

## 2014-02-24 NOTE — ED Provider Notes (Signed)
CSN: [id number]     Arrival date & time 02/24/14  0945 History  This chart was scribed for Kerrie Buffalo, NP working with Gerhard Munch, MD by Roxy Cedar, ED Scribe. This patient was seen in room APA18/[facility]18 and the patient's care was started at 4:51 PM.   Chief Complaint  Patient presents with  . Joint Pain   The history is provided by the patient. No language interpreter was used.   HPI Comments: [name] is a 49 y.o. female with a PMHx of rheumatoid arthritis, chronic anemia, chronic back pain, depression and incidental lung nodule, who presents to the Emergency Department complaining of moderate flare up of joints and associated joint pain that began yesterday. She worked 10 days in a row because her daughter is getting married tomorrow. She reports that her joint pain today is severe and she can not got to the wedding like this. She called her Rheum otologist yesterday but he could not get her in until Tuesday. She is currently taking prednisone and Methotrexate. Her doctor told her yesterday to increase the prednisone. She has not been taking her ibuprofen.    Past Medical History  Diagnosis Date  . Rheumatoid arthritis(714.0)   . Chronic anemia   . Hypertension   . Arthritis, rheumatoid   . Incidental lung nodule   . Depression   . Chronic pain   . Chronic back pain    Past Surgical History  Procedure Laterality Date  . Tubal ligation    . Endometrial ablation    . Breast cyst excision  12/31/2010    Procedure: CYST EXCISION BREAST;  Surgeon: Fabio Bering, MD;  Location: [facility];  Service: General;  Laterality: Right;  Excision Sebaceous Cyst Right Breast   Family History  Problem Relation Age of Onset  . Anesthesia problems Neg Hx   . Cancer Father    History  Substance Use Topics  . Smoking status: Current Every Day Smoker -- 0.03 packs/day for 30 years    Types: Cigarettes  . Smokeless tobacco: Never Used  . Alcohol Use: No     Comment: occassional   OB  History    Gravida Para Term Preterm AB TAB SAB Ectopic Multiple Living   6 5 5  1  1   5      Review of Systems  Constitutional:       Per HPI, otherwise negative  HENT:       Per HPI, otherwise negative  Respiratory:       Per HPI, otherwise negative  Cardiovascular:       Per HPI, otherwise negative  Gastrointestinal: Negative for vomiting.  Endocrine:       Negative aside from HPI  Genitourinary:       Neg aside from HPI   Musculoskeletal: Positive for arthralgias.       Per HPI, otherwise negative  Skin: Negative.   Neurological: Negative for syncope.   Allergies  Codeine  Home Medications   Prior to Admission medications   Medication Sig Start Date End Date Taking? Authorizing Provider  Aspirin-Salicylamide-Caffeine (BC HEADACHE) 325-95-16 MG TABS Take 2 packets by mouth daily as needed (pain).    Yes Historical Provider, MD  Calcium Carbonate-Vitamin D (CALCIUM 600 + D PO) Take 1 tablet by mouth every morning.    Yes Historical Provider, MD  ferrous sulfate 325 (65 FE) MG tablet Take 325 mg by mouth 2 (two) times daily. For anemia    Yes Historical Provider,  MD  folic acid (FOLVITE) 1 MG tablet Take 1 mg by mouth daily.     Yes Historical Provider, MD  hydroxychloroquine (PLAQUENIL) 200 MG tablet Take 200 mg by mouth 3 (three) times daily.    Yes Historical Provider, MD  ibuprofen (ADVIL,MOTRIN) 800 MG tablet Take 800 mg by mouth every 8 (eight) hours as needed for headache, mild pain or moderate pain.   Yes Historical Provider, MD  methotrexate (RHEUMATREX) 2.5 MG tablet Take 12.5 mg by mouth 2 (two) times a week. Mondays_TuesdaysCaution:Chemotherapy. Protect from light.   Yes Historical Provider, MD  Multiple Vitamin (MULTIVITAMIN WITH MINERALS) TABS Take 1 tablet by mouth daily.   Yes Historical Provider, MD  predniSONE (DELTASONE) 20 MG tablet Take 40 mg by mouth 2 (two) times daily with a meal.   Yes Historical Provider, MD  HYDROcodone-acetaminophen (NORCO/VICODIN)  5-325 MG per tablet Take 1 tablet by mouth every 4 (four) hours as needed. Patient not taking: Reported on 02/24/2014 01/10/14   Kathie Dike, PA-C  metroNIDAZOLE (FLAGYL) 500 MG tablet Take 1 tablet (500 mg total) by mouth 2 (two) times daily. 02/24/14   Kenidy Crossland Orlene Och, NP  oxyCODONE-acetaminophen (ROXICET) 5-325 MG per tablet Take 1 tablet by mouth every 6 (six) hours as needed for severe pain. 02/24/14   Willy Pinkerton Orlene Och, NP  promethazine (PHENERGAN) 12.5 MG tablet Take 1 tablet (12.5 mg total) by mouth every 6 (six) hours as needed for nausea or vomiting. 02/24/14   Kadeidra Coryell Orlene Och, NP   Triage Vitals: BP 116/98 mmHg  Pulse 110  Temp(Src) 97.7 F (36.5 C) (Oral)  Resp 16  Ht 5\' 6"  (1.676 m)  Wt 150 lb (68.04 kg)  BMI 24.22 kg/m2  SpO2 100%  Physical Exam  Constitutional: She is oriented to person, place, and time. She appears well-developed and well-nourished. No distress.  Patient appears uncomfortable.   HENT:  Head: Normocephalic and atraumatic.  Eyes: Conjunctivae and EOM are normal.  Neck: Neck supple.  Cardiovascular: Tachycardia present.   Pulmonary/Chest: Effort normal and breath sounds normal. No stridor. No respiratory distress.  Abdominal: Soft. Bowel sounds are normal. She exhibits no distension. There is no tenderness.  Genitourinary:  External genitalia without lesions, frothy d/c vaginal vault. No CMT, no adnexal tenderness, uterus without palpable enlargement.   Musculoskeletal: She exhibits tenderness.  Joint swelling of fingers and wrists. Pain with movement and palpation.  Lower extremities without edema. Radial and pedal pulses 2+.   Neurological: She is alert and oriented to person, place, and time. No cranial nerve deficit.  Skin: Skin is warm and dry.  Psychiatric: She has a normal mood and affect. Her behavior is normal.  Nursing note and vitals reviewed.  ED Course  Procedures (including critical care time) Lab results: Urine shows trichomonas Wet prep shows  moderate clue and moderate trichomonas  DIAGNOSTIC STUDIES: Oxygen Saturation is 100% on room air, normal by my interpretation.    COORDINATION OF CARE: 4:51 PM- Discussed plans to Pt advised of plan for treatment and pt agrees. No results found for this or any previous visit (from the past 24 hour(s)).  MDM  49 y.o. female with acute flair of RA. Treated with Toradol 30 mg IM and Percocet and Dilaudid here in the ED. Pain much improved. Pelvic exam done due to trichomonas in urine. Cultures sent for GC and Chlamydia. Patient stable for d/c with pain under control. She will follow up with her Rheumatologist on Monday. Will give pain medication to  last her the weekend while she is at her daughter's wedding.  Will treat trichomonas and she will follow up with Dr. Emelda Fear (GYN). Discussed with the patient and all questioned fully answered. She will return here if any problems arise.  Final diagnoses:  Joint pain  Trichomonas infection   I personally performed the services described in this documentation, which was scribed in my presence. The recorded information has been reviewed and is accurate.   [address] [place], [place] 02/25/14 1654  Gerhard Munch, MD 02/25/14 2030

## 2014-02-24 NOTE — Discharge Instructions (Signed)
Follow up with your doctor on Monday for your arthritis. Follow up with Dr. Emelda Fear for your trichomonas infection and your pap smear.

## 2014-02-24 NOTE — ED Notes (Signed)
Coke given to drink, pt encouraged to drink until she can urinate.

## 2014-02-24 NOTE — ED Notes (Signed)
Pt states pain to most of joints flared up yesterday. Hx of RA.

## 2014-02-24 NOTE — ED Notes (Signed)
MD at bedside. 

## 2014-02-26 LAB — GC/CHLAMYDIA PROBE AMP (~~LOC~~) NOT AT ARMC
Chlamydia: NEGATIVE
NEISSERIA GONORRHEA: NEGATIVE

## 2014-03-01 ENCOUNTER — Emergency Department (HOSPITAL_COMMUNITY)
Admission: EM | Admit: 2014-03-01 | Discharge: 2014-03-02 | Disposition: A | Discharge status: Home / Self Care | Payer: Self-pay | Attending: Emergency Medicine | Admitting: Emergency Medicine

## 2014-03-01 ENCOUNTER — Encounter (HOSPITAL_COMMUNITY): Payer: Self-pay | Admitting: *Deleted

## 2014-03-01 DIAGNOSIS — Z79899 Other long term (current) drug therapy: Secondary | ICD-10-CM | POA: Insufficient documentation

## 2014-03-01 DIAGNOSIS — Z7982 Long term (current) use of aspirin: Secondary | ICD-10-CM | POA: Insufficient documentation

## 2014-03-01 DIAGNOSIS — M25511 Pain in right shoulder: Secondary | ICD-10-CM

## 2014-03-01 DIAGNOSIS — W01198A Fall on same level from slipping, tripping and stumbling with subsequent striking against other object, initial encounter: Secondary | ICD-10-CM | POA: Insufficient documentation

## 2014-03-01 DIAGNOSIS — Y92009 Unspecified place in unspecified non-institutional (private) residence as the place of occurrence of the external cause: Secondary | ICD-10-CM | POA: Insufficient documentation

## 2014-03-01 DIAGNOSIS — Y998 Other external cause status: Secondary | ICD-10-CM | POA: Insufficient documentation

## 2014-03-01 DIAGNOSIS — S8992XA Unspecified injury of left lower leg, initial encounter: Secondary | ICD-10-CM | POA: Insufficient documentation

## 2014-03-01 DIAGNOSIS — S59901A Unspecified injury of right elbow, initial encounter: Secondary | ICD-10-CM | POA: Insufficient documentation

## 2014-03-01 DIAGNOSIS — M069 Rheumatoid arthritis, unspecified: Secondary | ICD-10-CM | POA: Insufficient documentation

## 2014-03-01 DIAGNOSIS — S8991XA Unspecified injury of right lower leg, initial encounter: Secondary | ICD-10-CM | POA: Insufficient documentation

## 2014-03-01 DIAGNOSIS — W19XXXA Unspecified fall, initial encounter: Secondary | ICD-10-CM

## 2014-03-01 DIAGNOSIS — M25521 Pain in right elbow: Secondary | ICD-10-CM

## 2014-03-01 DIAGNOSIS — Z7952 Long term (current) use of systemic steroids: Secondary | ICD-10-CM | POA: Insufficient documentation

## 2014-03-01 DIAGNOSIS — S4991XA Unspecified injury of right shoulder and upper arm, initial encounter: Secondary | ICD-10-CM | POA: Insufficient documentation

## 2014-03-01 DIAGNOSIS — M25562 Pain in left knee: Secondary | ICD-10-CM

## 2014-03-01 DIAGNOSIS — I10 Essential (primary) hypertension: Secondary | ICD-10-CM | POA: Insufficient documentation

## 2014-03-01 DIAGNOSIS — F329 Major depressive disorder, single episode, unspecified: Secondary | ICD-10-CM | POA: Insufficient documentation

## 2014-03-01 DIAGNOSIS — D649 Anemia, unspecified: Secondary | ICD-10-CM | POA: Insufficient documentation

## 2014-03-01 DIAGNOSIS — Z720 Tobacco use: Secondary | ICD-10-CM | POA: Insufficient documentation

## 2014-03-01 DIAGNOSIS — G8929 Other chronic pain: Secondary | ICD-10-CM | POA: Insufficient documentation

## 2014-03-01 DIAGNOSIS — Y9389 Activity, other specified: Secondary | ICD-10-CM | POA: Insufficient documentation

## 2014-03-01 DIAGNOSIS — M79661 Pain in right lower leg: Secondary | ICD-10-CM

## 2014-03-01 NOTE — ED Notes (Signed)
Pt states swelling to her right leg has been there for a week. Pt states falling today, complaining of pain in right elbow & left knee.

## 2014-03-01 NOTE — ED Notes (Signed)
Pain rt lower leg with swelling for 2 days. Seen here 2/13

## 2014-03-02 ENCOUNTER — Ambulatory Visit (HOSPITAL_COMMUNITY)
Admission: EM | Admit: 2014-03-02 | Discharge: 2014-03-02 | Disposition: A | Discharge status: Home / Self Care | Payer: Self-pay | Attending: Emergency Medicine | Admitting: Emergency Medicine

## 2014-03-02 ENCOUNTER — Emergency Department (HOSPITAL_COMMUNITY): Payer: Self-pay

## 2014-03-02 DIAGNOSIS — W19XXXA Unspecified fall, initial encounter: Secondary | ICD-10-CM | POA: Insufficient documentation

## 2014-03-02 DIAGNOSIS — S8011XA Contusion of right lower leg, initial encounter: Secondary | ICD-10-CM | POA: Insufficient documentation

## 2014-03-02 DIAGNOSIS — M79661 Pain in right lower leg: Secondary | ICD-10-CM | POA: Insufficient documentation

## 2014-03-02 LAB — D-DIMER, QUANTITATIVE (NOT AT ARMC): D DIMER QUANT: 11.73 ug{FEU}/mL — AB (ref 0.00–0.48)

## 2014-03-02 MED ORDER — KETOROLAC TROMETHAMINE 60 MG/2ML IM SOLN
60.0000 mg | Freq: Once | INTRAMUSCULAR | Status: AC
  Administered 2014-03-02: 60 mg via INTRAMUSCULAR
  Filled 2014-03-02: qty 2

## 2014-03-02 MED ORDER — ENOXAPARIN SODIUM 100 MG/ML ~~LOC~~ SOLN
SUBCUTANEOUS | Status: AC
  Filled 2014-03-02: qty 1

## 2014-03-02 MED ORDER — ENOXAPARIN SODIUM 80 MG/0.8ML ~~LOC~~ SOLN
100.0000 mg | Freq: Once | SUBCUTANEOUS | Status: AC
  Administered 2014-03-02: 100 mg via SUBCUTANEOUS

## 2014-03-02 MED ORDER — DEXAMETHASONE SODIUM PHOSPHATE 4 MG/ML IJ SOLN
10.0000 mg | Freq: Once | INTRAMUSCULAR | Status: AC
  Administered 2014-03-02: 10 mg via INTRAMUSCULAR

## 2014-03-02 MED ORDER — DEXAMETHASONE SODIUM PHOSPHATE 4 MG/ML IJ SOLN
10.0000 mg | Freq: Once | INTRAMUSCULAR | Status: DC
  Filled 2014-03-02: qty 3

## 2014-03-02 NOTE — Discharge Instructions (Signed)
Continue your medications for your rheumatoid arthritis. You have an appointment at 2:15 later today (Friday, Feb 19th) in radiology to get a doppler ultrasound to check your right leg for a blood clot. Arrive 15 minutes early to check in at Radiology. You will be given the test results after your test tomorrow by the ED physician.  Return to the ED if you get chest pain or shortness of breath.

## 2014-03-02 NOTE — ED Provider Notes (Addendum)
CSN: [id number]     Arrival date & time 03/01/14  1901 History   First MD Initiated Contact with Patient 03/01/14 2303     Chief Complaint  Patient presents with  . Leg Pain     (Consider location/radiation/quality/duration/timing/severity/associated sxs/prior Treatment) HPI  Patient has a history of rheumatoid arthritis. She was recently seen in the ER on February 14 with acute rheumatoid arthritis flareup. She reports she's currently on prednisone 40 mg twice a day. Due to the snow she missed her methotrexate earlier this week and she supposed to get it tomorrow and the following day. She also is still on her plaque when ill. She states that for the past several days she has noted some swelling of her right lower leg and she has some pain in her right calf. She states she feels like a knot in her calf. She states in the morning her leg is very swollen. She states that the veins are "bulging". She states about 6 PM she tried to stand up and her legs wouldn't "work right". And she couldn't stand and she fell. She states when she fell she caught herself with her right arm. She complains of pain in her right shoulder and her right elbow. When she fell she hit her left knee on an ottoman. She complains of pain in her left knee. She denies hitting her head or having loss of consciousness. She denies chest pain, shortness of breath, or fever. Patient states she is normally left-handed. She denies having any problem with the swelling in her calf before. She specifically denies having DVT in the past.    PCP Dr Janna Arch Rheumatology Dr Lanell Matar at [facility]  Past Medical History  Diagnosis Date  . Rheumatoid arthritis(714.0)   . Chronic anemia   . Hypertension   . Arthritis, rheumatoid   . Incidental lung nodule   . Depression   . Chronic pain   . Chronic back pain    Past Surgical History  Procedure Laterality Date  . Tubal ligation    . Endometrial ablation    . Breast cyst excision   12/31/2010    Procedure: CYST EXCISION BREAST;  Surgeon: Fabio Bering, MD;  Location: [facility];  Service: General;  Laterality: Right;  Excision Sebaceous Cyst Right Breast   Family History  Problem Relation Age of Onset  . Anesthesia problems Neg Hx   . Cancer Father    History  Substance Use Topics  . Smoking status: Current Every Day Smoker -- 0.03 packs/day for 30 years    Types: Cigarettes  . Smokeless tobacco: Never Used  . Alcohol Use: No     Comment: occassional   Son lives with patient based currently on atenolol Patient is trying to quit smoking, she has not had a cigarette for 5 days.  OB History    Gravida Para Term Preterm AB TAB SAB Ectopic Multiple Living   6 5 5  1  1   5      Review of Systems  All other systems reviewed and are negative.     Allergies  Codeine  Home Medications   Prior to Admission medications   Medication Sig Start Date End Date Taking? Authorizing Provider  Calcium Carbonate-Vitamin D (CALCIUM 600 + D PO) Take 1 tablet by mouth every morning.    Yes Historical Provider, MD  ferrous sulfate 325 (65 FE) MG tablet Take 325 mg by mouth 2 (two) times daily. For anemia    Yes Historical  Provider, MD  folic acid (FOLVITE) 1 MG tablet Take 1 mg by mouth daily.     Yes Historical Provider, MD  hydroxychloroquine (PLAQUENIL) 200 MG tablet Take 200 mg by mouth 3 (three) times daily.    Yes Historical Provider, MD  metroNIDAZOLE (FLAGYL) 500 MG tablet Take 1 tablet (500 mg total) by mouth 2 (two) times daily. 02/24/14  Yes Hope Orlene Och, NP  Multiple Vitamin (MULTIVITAMIN WITH MINERALS) TABS Take 1 tablet by mouth daily.   Yes Historical Provider, MD  oxyCODONE-acetaminophen (ROXICET) 5-325 MG per tablet Take 1 tablet by mouth every 6 (six) hours as needed for severe pain. 02/24/14  Yes Hope Orlene Och, NP  predniSONE (DELTASONE) 20 MG tablet Take 40 mg by mouth 2 (two) times daily with a meal.   Yes Historical Provider, MD  promethazine (PHENERGAN)  12.5 MG tablet Take 1 tablet (12.5 mg total) by mouth every 6 (six) hours as needed for nausea or vomiting. 02/24/14  Yes Hope Orlene Och, NP  Aspirin-Salicylamide-Caffeine (BC HEADACHE) 325-95-16 MG TABS Take 2 packets by mouth daily as needed (pain).     Historical Provider, MD  HYDROcodone-acetaminophen (NORCO/VICODIN) 5-325 MG per tablet Take 1 tablet by mouth every 4 (four) hours as needed. Patient not taking: Reported on 02/24/2014 01/10/14   Kathie Dike, PA-C  ibuprofen (ADVIL,MOTRIN) 800 MG tablet Take 800 mg by mouth every 8 (eight) hours as needed for headache, mild pain or moderate pain.    Historical Provider, MD  methotrexate (RHEUMATREX) 2.5 MG tablet Take 12.5 mg by mouth 2 (two) times a week. Friday and Saturday Caution:Chemotherapy. Protect from light.    Historical Provider, MD   BP 114/73 mmHg  Pulse 88  Temp(Src) 99.2 F (37.3 C) (Oral)  Resp 17  Ht 5\' 6"  (1.676 m)  Wt 150 lb (68.04 kg)  BMI 24.22 kg/m2  SpO2 95%  Vital signs normal   Physical Exam  Constitutional: She is oriented to person, place, and time. She appears well-developed and well-nourished.  Non-toxic appearance. She does not appear ill. No distress.  HENT:  Head: Normocephalic and atraumatic.  Right Ear: External ear normal.  Left Ear: External ear normal.  Nose: Nose normal. No mucosal edema or rhinorrhea.  Mouth/Throat: Oropharynx is clear and moist and mucous membranes are normal. No dental abscesses or uvula swelling.  Eyes: Conjunctivae and EOM are normal. Pupils are equal, round, and reactive to light.  Neck: Normal range of motion and full passive range of motion without pain. Neck supple.  Cardiovascular: Normal rate, regular rhythm and normal heart sounds.  Exam reveals no gallop and no friction rub.   No murmur heard. Pulmonary/Chest: Effort normal and breath sounds normal. No respiratory distress. She has no wheezes. She has no rhonchi. She has no rales. She exhibits no tenderness and no  crepitus.  Abdominal: Soft. Normal appearance and bowel sounds are normal. She exhibits no distension. There is no tenderness. There is no rebound and no guarding.  Musculoskeletal: Normal range of motion. She exhibits no edema or tenderness.  Patient is noted to have some enlargement of her MCP and IP joints of her hands. On exam of her right shoulder there is no obvious bruising or swelling. She is unable to abduct her shoulder because of pain. On exam of her right elbow she is noted to have a moderate joint effusion. She has pain on attempted range of motion. She has good distal pulses. On exam of patient's left knee there is  some mild enlargement of the joint however is consistent with the joint on the right. There is no obvious abrasions or bruising seen. There may be a small effusion present. On exam the patient's right lower extremity her right lower leg does appear to be enlarged compared to the left. She is very tender in her right calf. She is nontender to palpation over her medial thigh.  Neurological: She is alert and oriented to person, place, and time. She has normal strength. No cranial nerve deficit.  Skin: Skin is warm, dry and intact. No rash noted. No erythema. No pallor.  Psychiatric: She has a normal mood and affect. Her speech is normal and behavior is normal. Her mood appears not anxious.  Nursing note and vitals reviewed.   ED Course  Procedures (including critical care time)  Medications  ketorolac (TORADOL) injection 60 mg (60 mg Intramuscular Given 03/02/14 0024)  enoxaparin (LOVENOX) injection 100 mg (100 mg Subcutaneous Given 03/02/14 0152)  dexamethasone (DECADRON) injection 10 mg (10 mg Intramuscular Given 03/02/14 0157)    Patient was given Toradol for pain.  2 AM patient was given her test results. She was still having some diffuse arthritic pain. She was given Decadron IM to boost her steroids for her arthritis. She was given Lovenox 1.5 mg per KG subcutaneous  for possible DVT in her right calf. She was giving the higher dose to make sure she is covered until she gets her exam done in her test results.. She has an appointment later today at 12:15 to get a Doppler ultrasound of her right lower extremity as an outpatient. We discussed returning to the ED sooner however if she gets chest pain or shortness of breath. Patient does admit she has been laying around the house and not as active as usual.  Review of the [place] shows patient has had 8 prescriptions of narcotics since September 2015. All are from our emergency department. This is the patient's 10th ED visit in 6 months.   Labs Review Results for orders placed or performed during the hospital encounter of 03/01/14  D-dimer, quantitative  Result Value Ref Range   D-Dimer, Quant 11.73 (H) 0.00 - 0.48 ug/mL-FEU    Laboratory interpretation all normal except very elevated d-dimer   Imaging Review Dg Shoulder Right  03/02/2014   CLINICAL DATA:  Larey Seat getting off couch today  EXAM: RIGHT SHOULDER - 2+ VIEW  COMPARISON:  None.  FINDINGS: There is no evidence of fracture or dislocation. There is no evidence of arthropathy or other focal bone abnormality. Soft tissues are unremarkable.  IMPRESSION: Negative.   Electronically Signed   By: Ellery Plunk M.D.   On: 03/02/2014 01:29   Dg Elbow Complete Right  03/02/2014   CLINICAL DATA:  Larey Seat getting off couch today.  EXAM: RIGHT ELBOW - COMPLETE 3+ VIEW  COMPARISON:  None.  FINDINGS: There is no evidence of fracture, dislocation, or joint effusion. There is no evidence of arthropathy or other focal bone abnormality. Soft tissues are unremarkable.  IMPRESSION: Negative.   Electronically Signed   By: Ellery Plunk M.D.   On: 03/02/2014 01:28   Dg Knee Complete 4 Views Left  03/02/2014   CLINICAL DATA:  Left knee pain and swelling. Fell while getting off couch today  EXAM: LEFT KNEE - COMPLETE 4+ VIEW  COMPARISON:  None.  FINDINGS:  There is no evidence of fracture, dislocation, or joint effusion. There is no evidence of arthropathy or other focal bone abnormality.  Soft tissues are unremarkable.  IMPRESSION: Negative.   Electronically Signed   By: Ellery Plunk M.D.   On: 03/02/2014 01:28     EKG Interpretation None      MDM   Final diagnoses:  Fall at home, initial encounter  Pain in shoulder region, right  Knee pain, left  Elbow pain, right  Calf pain, right    Plan discharge  Devoria Albe, MD, Franz Dell, MD 03/02/14 8546  Ward Givens, MD 03/02/14 [phone]

## 2014-03-02 NOTE — ED Notes (Signed)
Assumed care for discharge.  Discharge instructions given and reviewed with patient.  Patient verbalized understanding to return at noon for ultrasound on her leg.  Patient discharged home in good condition via wheelchair.

## 2014-03-02 NOTE — ED Provider Notes (Signed)
Patient aware of [place] results. No new complaints.  Gerhard Munch, MD 03/02/14 1344

## 2014-03-14 IMAGING — CR DG SHOULDER 2+V*L*
3 series · 3 of 3 positions shown · non-contrast
Comparison: None.

CLINICAL DATA: Pain post trauma

LEFT SHOULDER - 2+ VIEW

[view not recorded (1 of 3)]
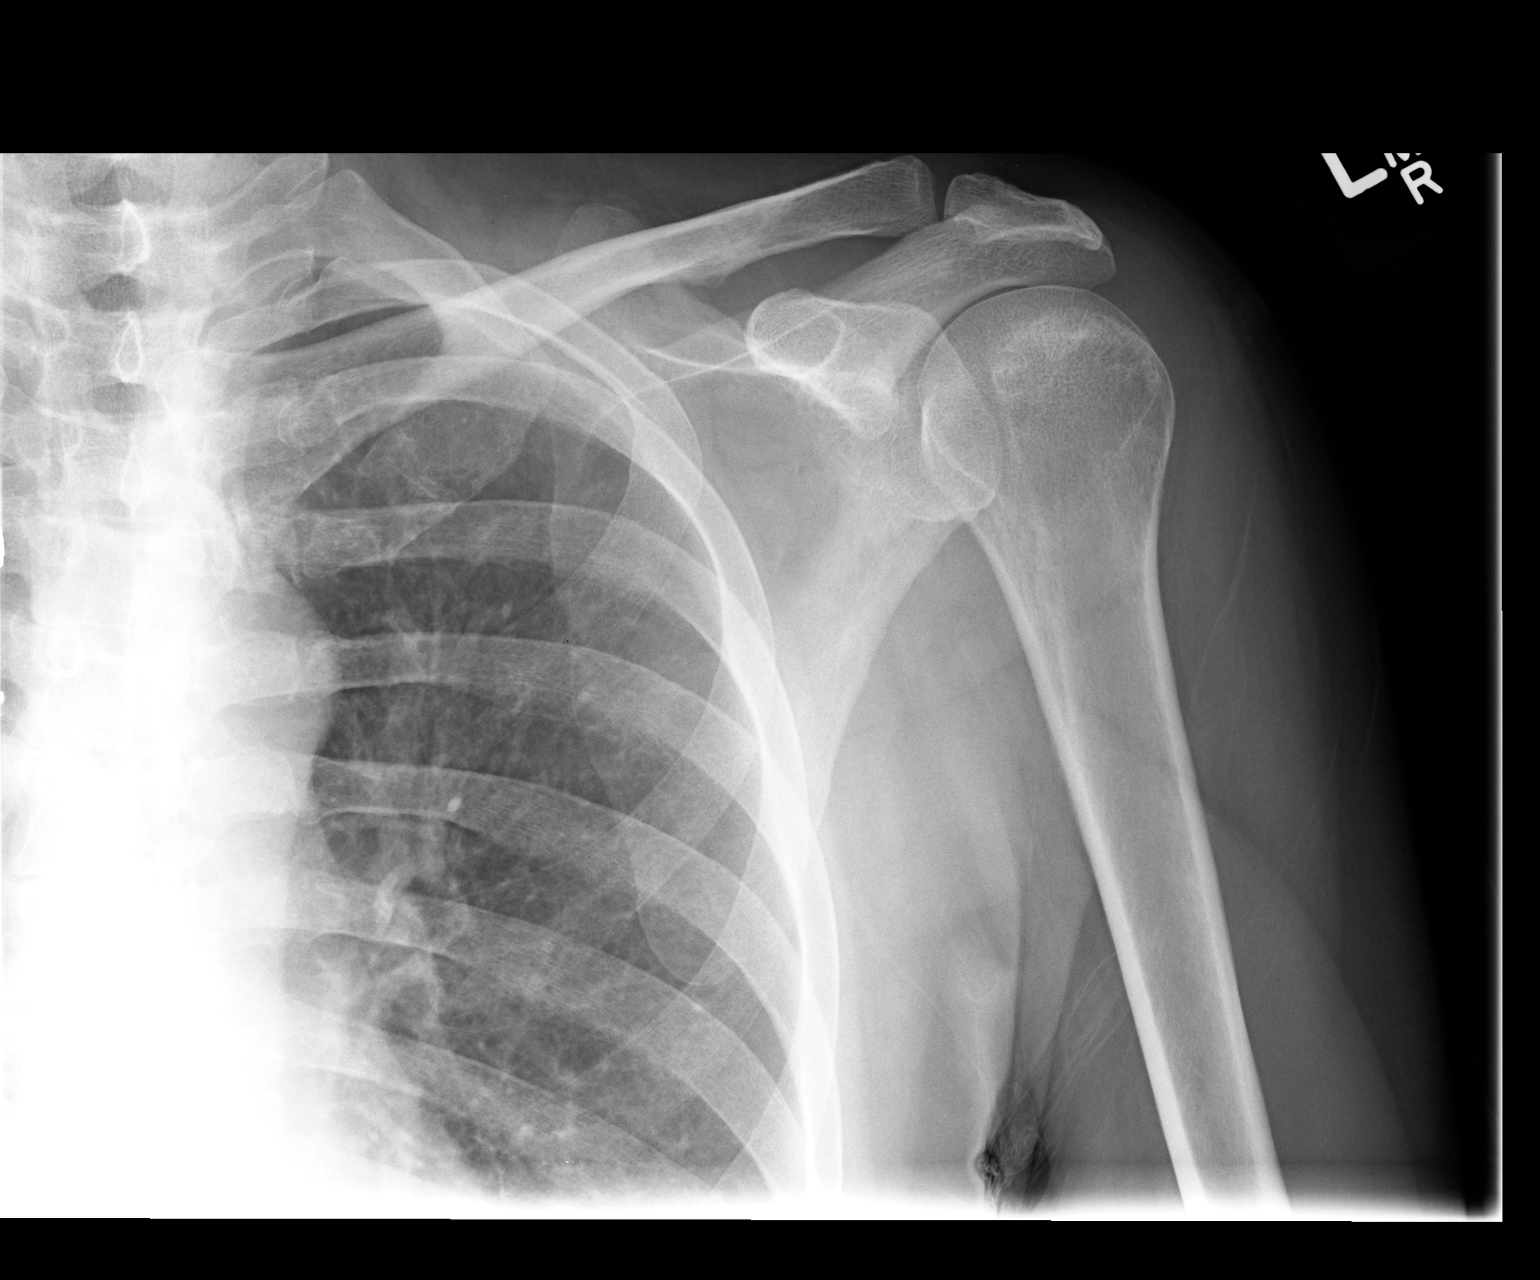

[view not recorded (2 of 3)]
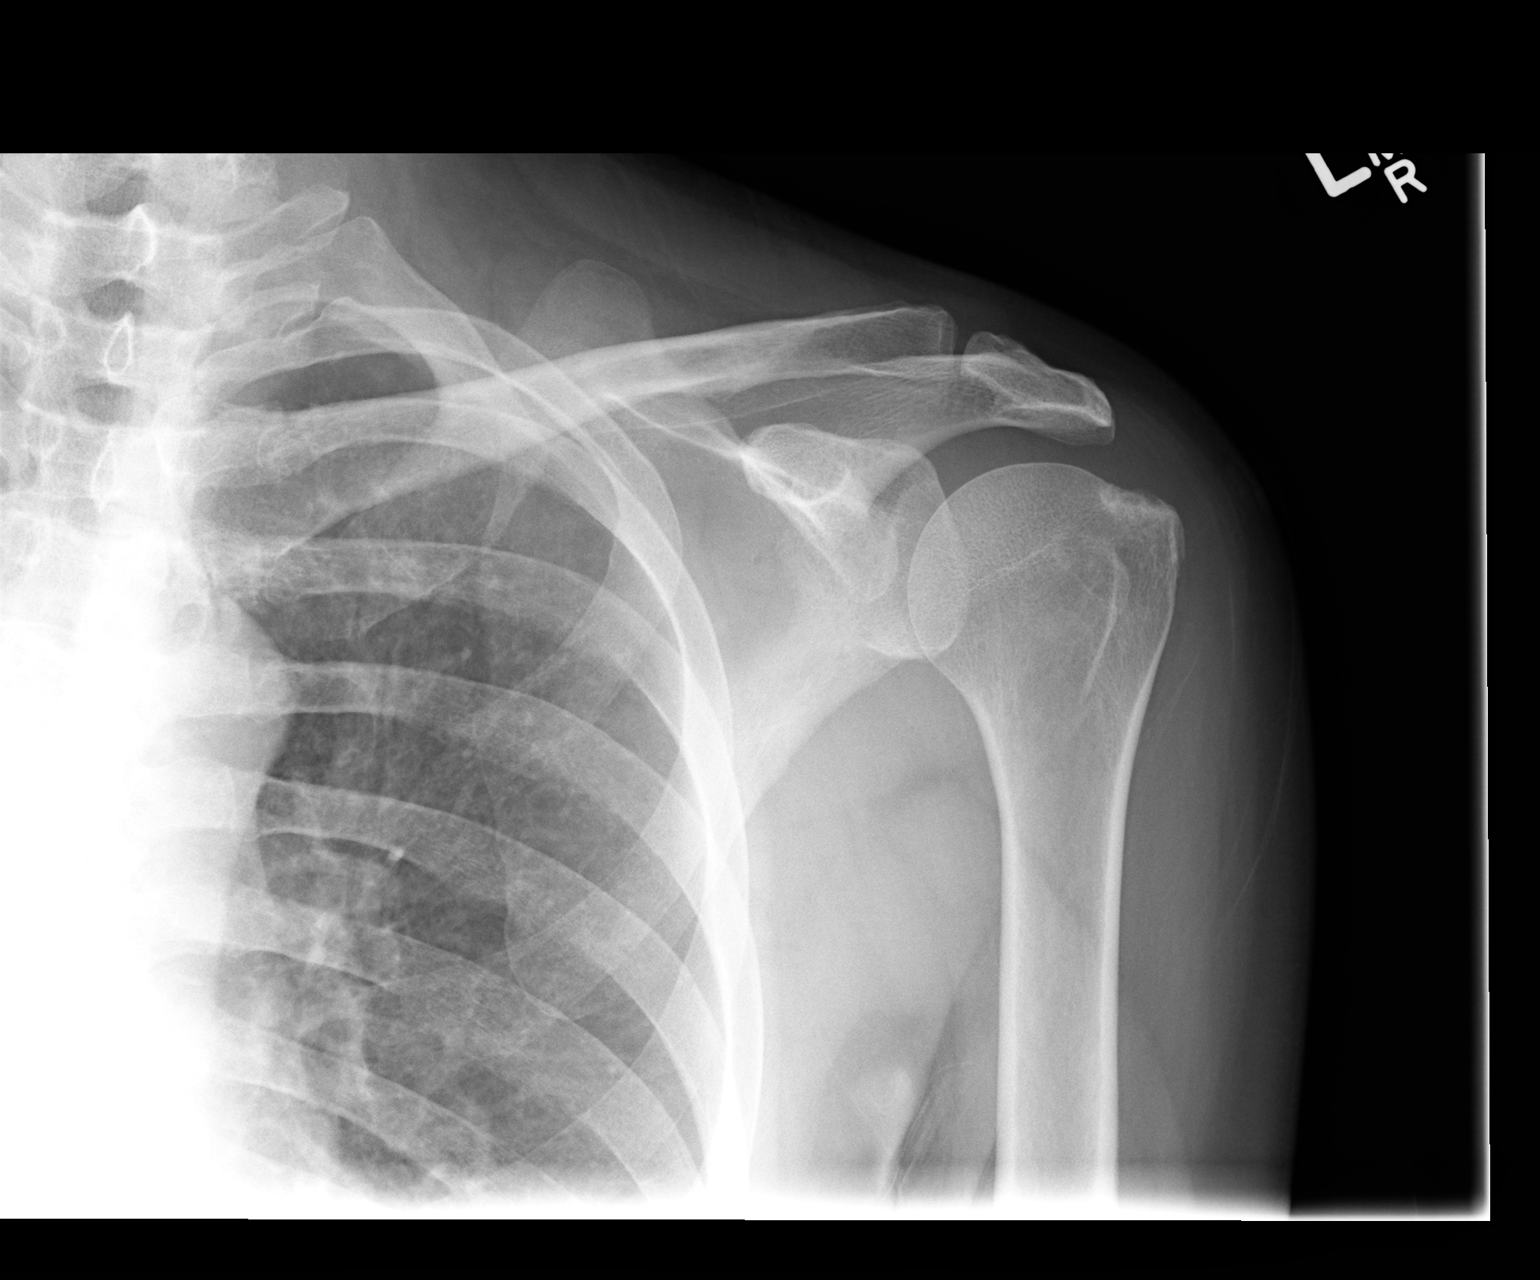

[view not recorded (3 of 3)]
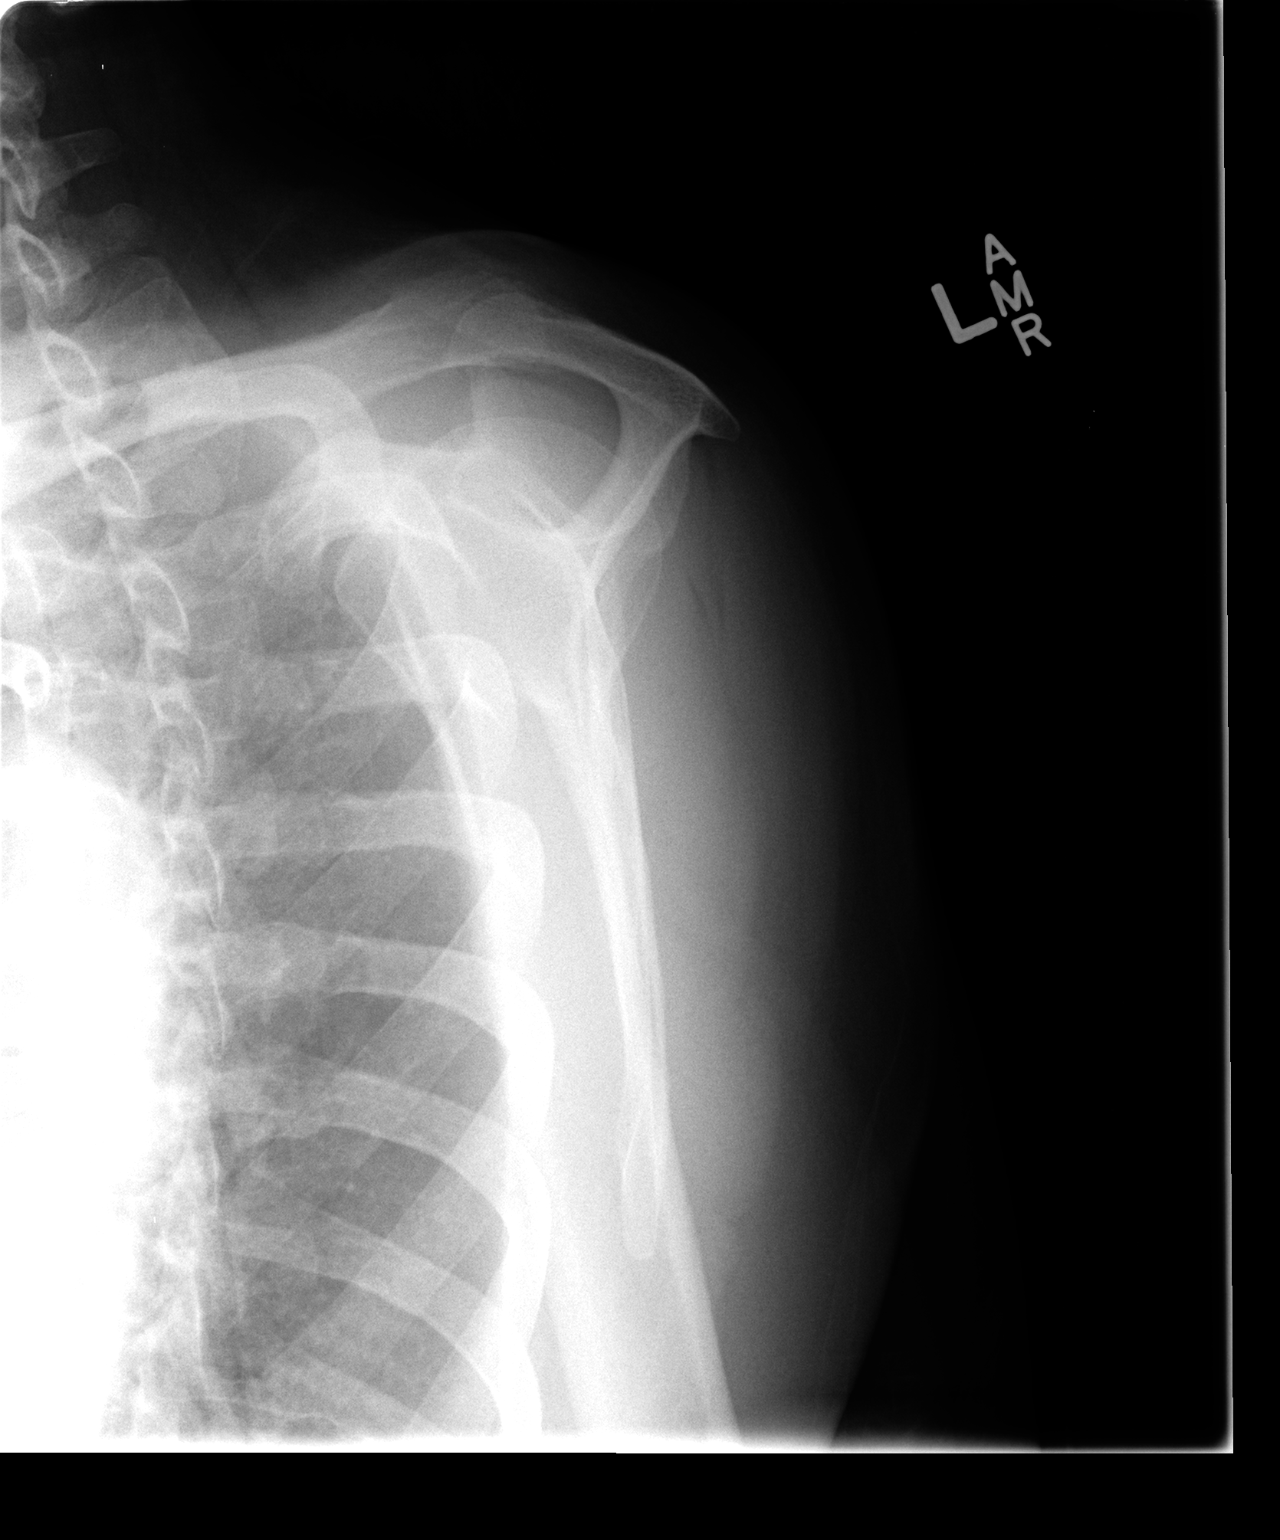

[3 of 3 positions shown; findings below may reference images not displayed]

FINDINGS: Internal rotation, external rotation, and Y scapular
views were obtained.  There is no fracture or dislocation.  Joint
spaces appear intact.  No erosive change or intra-articular
calcifications.
IMPRESSION: No abnormality noted.

## 2014-04-04 ENCOUNTER — Emergency Department (HOSPITAL_COMMUNITY): Payer: Self-pay

## 2014-04-04 ENCOUNTER — Encounter (HOSPITAL_COMMUNITY): Payer: Self-pay | Admitting: Emergency Medicine

## 2014-04-04 ENCOUNTER — Emergency Department (HOSPITAL_COMMUNITY)
Admission: EM | Admit: 2014-04-04 | Discharge: 2014-04-04 | Disposition: A | Discharge status: Home / Self Care | Payer: Self-pay | Attending: Emergency Medicine | Admitting: Emergency Medicine

## 2014-04-04 DIAGNOSIS — G8929 Other chronic pain: Secondary | ICD-10-CM | POA: Insufficient documentation

## 2014-04-04 DIAGNOSIS — Z720 Tobacco use: Secondary | ICD-10-CM | POA: Insufficient documentation

## 2014-04-04 DIAGNOSIS — Z79899 Other long term (current) drug therapy: Secondary | ICD-10-CM | POA: Insufficient documentation

## 2014-04-04 DIAGNOSIS — R079 Chest pain, unspecified: Secondary | ICD-10-CM | POA: Insufficient documentation

## 2014-04-04 DIAGNOSIS — M25562 Pain in left knee: Secondary | ICD-10-CM | POA: Insufficient documentation

## 2014-04-04 DIAGNOSIS — F329 Major depressive disorder, single episode, unspecified: Secondary | ICD-10-CM | POA: Insufficient documentation

## 2014-04-04 DIAGNOSIS — M1990 Unspecified osteoarthritis, unspecified site: Secondary | ICD-10-CM | POA: Insufficient documentation

## 2014-04-04 DIAGNOSIS — I10 Essential (primary) hypertension: Secondary | ICD-10-CM | POA: Insufficient documentation

## 2014-04-04 DIAGNOSIS — D649 Anemia, unspecified: Secondary | ICD-10-CM | POA: Insufficient documentation

## 2014-04-04 LAB — BASIC METABOLIC PANEL
ANION GAP: 10 (ref 5–15)
BUN: 10 mg/dL (ref 6–23)
CHLORIDE: 106 mmol/L (ref 96–112)
CO2: 25 mmol/L (ref 19–32)
Calcium: 8.8 mg/dL (ref 8.4–10.5)
Creatinine, Ser: 0.91 mg/dL (ref 0.50–1.10)
GFR calc Af Amer: 86 mL/min — ABNORMAL LOW (ref >90)
GFR calc non Af Amer: 74 mL/min — ABNORMAL LOW (ref >90)
Glucose, Bld: 97 mg/dL (ref 70–99)
POTASSIUM: 3.6 mmol/L (ref 3.5–5.1)
Sodium: 141 mmol/L (ref 135–145)

## 2014-04-04 LAB — BRAIN NATRIURETIC PEPTIDE: B NATRIURETIC PEPTIDE 5: 47 pg/mL (ref 0.0–100.0)

## 2014-04-04 LAB — CBC
HEMATOCRIT: 31.3 % — AB (ref 36.0–46.0)
Hemoglobin: 9.4 g/dL — ABNORMAL LOW (ref 12.0–15.0)
MCH: 23.3 pg — ABNORMAL LOW (ref 26.0–34.0)
MCHC: 30 g/dL (ref 30.0–36.0)
MCV: 77.5 fL — ABNORMAL LOW (ref 78.0–100.0)
Platelets: 757 10*3/uL — ABNORMAL HIGH (ref 150–400)
RBC: 4.04 MIL/uL (ref 3.87–5.11)
RDW: 19.7 % — AB (ref 11.5–15.5)
WBC: 6.9 10*3/uL (ref 4.0–10.5)

## 2014-04-04 LAB — TROPONIN I: Troponin I: <0.03 ng/mL (ref <0.031)

## 2014-04-04 MED ORDER — KETOROLAC TROMETHAMINE 30 MG/ML IJ SOLN
30.0000 mg | Freq: Once | INTRAMUSCULAR | Status: AC
  Administered 2014-04-04: 30 mg via INTRAVENOUS
  Filled 2014-04-04: qty 1

## 2014-04-04 MED ORDER — ASPIRIN 325 MG PO TABS
325.0000 mg | ORAL_TABLET | Freq: Once | ORAL | Status: AC
  Administered 2014-04-04: 325 mg via ORAL
  Filled 2014-04-04: qty 1

## 2014-04-04 MED ORDER — MORPHINE SULFATE 4 MG/ML IJ SOLN
4.0000 mg | Freq: Once | INTRAMUSCULAR | Status: AC
  Administered 2014-04-04: 4 mg via INTRAVENOUS
  Filled 2014-04-04: qty 1

## 2014-04-04 MED ORDER — HYDROCODONE-ACETAMINOPHEN 5-325 MG PO TABS: 1.0000 | ORAL_TABLET | ORAL | Status: DC | PRN

## 2014-04-04 NOTE — Discharge Instructions (Signed)
You are anemic.   Present hemoglobin is 9.4.   You will need to see your primary care doctor for follow-up. Medication for pain. Recommend purchasing ankle braces at [facility] or [facility]. Return if worse

## 2014-04-04 NOTE — ED Notes (Signed)
Pt c/o chest pain, sob, bilateral foot pain, and left knee pain.

## 2014-04-05 NOTE — ED Provider Notes (Signed)
CSN: [id number]     Arrival date & time 04/04/14  1925 History   First MD Initiated Contact with Patient 04/04/14 1927     Chief Complaint  Patient presents with  . Chest Pain     (Consider location/radiation/quality/duration/timing/severity/associated sxs/prior Treatment) HPI.... Lower anterior chest pain described as tightness intermittently since this morning. No nausea, diaphoresis, dyspnea. Complains of bilateral foot pain secondary to standing all day. No history of heart trouble. Cigarette smoker. No radiation of pain. Nothing makes symptoms better or worse.  Past Medical History  Diagnosis Date  . Rheumatoid arthritis(714.0)   . Chronic anemia   . Hypertension   . Arthritis, rheumatoid   . Incidental lung nodule   . Depression   . Chronic pain   . Chronic back pain    Past Surgical History  Procedure Laterality Date  . Tubal ligation    . Endometrial ablation    . Breast cyst excision  12/31/2010    Procedure: CYST EXCISION BREAST;  Surgeon: Fabio Bering, MD;  Location: [facility];  Service: General;  Laterality: Right;  Excision Sebaceous Cyst Right Breast   Family History  Problem Relation Age of Onset  . Anesthesia problems Neg Hx   . Cancer Father    History  Substance Use Topics  . Smoking status: Current Every Day Smoker -- 0.03 packs/day for 30 years    Types: Cigarettes  . Smokeless tobacco: Never Used  . Alcohol Use: No     Comment: occassional   OB History    Gravida Para Term Preterm AB TAB SAB Ectopic Multiple Living   6 5 5  1  1   5      Review of Systems  All other systems reviewed and are negative.     Allergies  Codeine  Home Medications   Prior to Admission medications   Medication Sig Start Date End Date Taking? Authorizing Provider  Aspirin-Salicylamide-Caffeine (BC HEADACHE) 325-95-16 MG TABS Take 2 packets by mouth daily as needed (pain).    Yes Historical Provider, MD  Calcium Carbonate-Vitamin D (CALCIUM 600 + D PO) Take 1  tablet by mouth every morning.    Yes Historical Provider, MD  ferrous sulfate 325 (65 FE) MG tablet Take 325 mg by mouth 2 (two) times daily. For anemia    Yes Historical Provider, MD  folic acid (FOLVITE) 1 MG tablet Take 1 mg by mouth daily.     Yes Historical Provider, MD  hydroxychloroquine (PLAQUENIL) 200 MG tablet Take 200 mg by mouth 3 (three) times daily.    Yes Historical Provider, MD  Multiple Vitamin (MULTIVITAMIN WITH MINERALS) TABS Take 1 tablet by mouth daily.   Yes Historical Provider, MD  predniSONE (DELTASONE) 20 MG tablet Take 40 mg by mouth 2 (two) times daily with a meal.   Yes Historical Provider, MD  HYDROcodone-acetaminophen (NORCO) 5-325 MG per tablet Take 1-2 tablets by mouth every 4 (four) hours as needed. 04/04/14   04/06/14, MD  methotrexate (RHEUMATREX) 2.5 MG tablet Take 12.5 mg by mouth 2 (two) times a week. Thursday and Friday Caution:Chemotherapy. Protect from light.    Historical Provider, MD  metroNIDAZOLE (FLAGYL) 500 MG tablet Take 1 tablet (500 mg total) by mouth 2 (two) times daily. Patient not taking: Reported on 04/04/2014 02/24/14   02/26/14, NP  oxyCODONE-acetaminophen (ROXICET) 5-325 MG per tablet Take 1 tablet by mouth every 6 (six) hours as needed for severe pain. Patient not taking: Reported on 04/04/2014 02/24/14  Hope Orlene Och, NP  promethazine (PHENERGAN) 12.5 MG tablet Take 1 tablet (12.5 mg total) by mouth every 6 (six) hours as needed for nausea or vomiting. Patient not taking: Reported on 04/04/2014 02/24/14   Janne Napoleon, NP   BP 98/72 mmHg  Pulse 71  Temp(Src) 97.9 F (36.6 C) (Oral)  Resp 18  Ht 5\' 6"  (1.676 m)  Wt 150 lb (68.04 kg)  BMI 24.22 kg/m2  SpO2 96% Physical Exam  Constitutional: She is oriented to person, place, and time. She appears well-developed and well-nourished.  HENT:  Head: Normocephalic and atraumatic.  Eyes: Conjunctivae and EOM are normal. Pupils are equal, round, and reactive to light.  Neck: Normal range of  motion. Neck supple.  Cardiovascular: Normal rate and regular rhythm.   Pulmonary/Chest: Effort normal and breath sounds normal.  Abdominal: Soft. Bowel sounds are normal.  Musculoskeletal:  Both feet are diffusely tender  Neurological: She is alert and oriented to person, place, and time.  Skin: Skin is warm and dry.  Psychiatric: She has a normal mood and affect. Her behavior is normal.  Nursing note and vitals reviewed.   ED Course  Procedures (including critical care time) Labs Review Labs Reviewed  CBC - Abnormal; Notable for the following:    Hemoglobin 9.4 (*)    HCT 31.3 (*)    MCV 77.5 (*)    MCH 23.3 (*)    RDW 19.7 (*)    Platelets 757 (*)    All other components within normal limits  BASIC METABOLIC PANEL - Abnormal; Notable for the following:    GFR calc non Af Amer 74 (*)    GFR calc Af Amer 86 (*)    All other components within normal limits  BRAIN NATRIURETIC PEPTIDE  TROPONIN I    Imaging Review Dg Chest Port 1 View  04/04/2014   CLINICAL DATA:  Chest pain for several hours  EXAM: PORTABLE CHEST - 1 VIEW  COMPARISON:  January 10, 2014  FINDINGS: Lungs are clear. Heart size and pulmonary vascularity are normal. No adenopathy. No pneumothorax. No bone lesions.  IMPRESSION: No edema or consolidation.   Electronically Signed   By: January 12, 2014 III M.D.   On: 04/04/2014 20:15   Dg Knee Complete 4 Views Left  04/04/2014   CLINICAL DATA:  Pain following fall.  Swelling laterally  EXAM: LEFT KNEE - COMPLETE 4+ VIEW  COMPARISON:  March 02, 2014  FINDINGS: Frontal, lateral, and bilateral oblique views were obtained. There is no fracture or dislocation. Joint spaces appear intact. No appreciable joint space narrowing. No erosive change.  IMPRESSION: No fracture or dislocation.  No appreciable arthropathy.   Electronically Signed   By: March 04, 2014 III M.D.   On: 04/04/2014 20:23     EKG Interpretation   Date/Time:  Wednesday April 04 2014 19:34:51  EDT Ventricular Rate:  76 PR Interval:  167 QRS Duration: 75 QT Interval:  356 QTC Calculation: 400 R Axis:   74 Text Interpretation:  Sinus rhythm Probable left atrial enlargement  Confirmed by Ahmere Hemenway  MD, Fenton Candee ([id number]) on 04/04/2014 7:48:10 PM      MDM   Final diagnoses:  Left knee pain  Chest pain, unspecified chest pain type  Anemia, unspecified anemia type    Patient is in no acute distress. EKG normal. Troponin negative. Hemoglobin has dropped to 9.4. Discussed findings with patient. She will follow-up with her primary care doctor.   04/06/2014, MD 04/05/14 2237

## 2014-04-09 ENCOUNTER — Emergency Department (HOSPITAL_COMMUNITY)
Admission: EM | Admit: 2014-04-09 | Discharge: 2014-04-09 | Disposition: A | Discharge status: Home / Self Care | Payer: Self-pay | Attending: Emergency Medicine | Admitting: Emergency Medicine

## 2014-04-09 ENCOUNTER — Emergency Department (HOSPITAL_COMMUNITY): Payer: Self-pay

## 2014-04-09 ENCOUNTER — Encounter (HOSPITAL_COMMUNITY): Payer: Self-pay | Admitting: Emergency Medicine

## 2014-04-09 DIAGNOSIS — D649 Anemia, unspecified: Secondary | ICD-10-CM | POA: Insufficient documentation

## 2014-04-09 DIAGNOSIS — R0781 Pleurodynia: Secondary | ICD-10-CM

## 2014-04-09 DIAGNOSIS — R05 Cough: Secondary | ICD-10-CM | POA: Insufficient documentation

## 2014-04-09 DIAGNOSIS — R0602 Shortness of breath: Secondary | ICD-10-CM | POA: Insufficient documentation

## 2014-04-09 DIAGNOSIS — Z7952 Long term (current) use of systemic steroids: Secondary | ICD-10-CM | POA: Insufficient documentation

## 2014-04-09 DIAGNOSIS — Z720 Tobacco use: Secondary | ICD-10-CM | POA: Insufficient documentation

## 2014-04-09 DIAGNOSIS — M069 Rheumatoid arthritis, unspecified: Secondary | ICD-10-CM | POA: Insufficient documentation

## 2014-04-09 DIAGNOSIS — Z79899 Other long term (current) drug therapy: Secondary | ICD-10-CM | POA: Insufficient documentation

## 2014-04-09 DIAGNOSIS — Z8659 Personal history of other mental and behavioral disorders: Secondary | ICD-10-CM | POA: Insufficient documentation

## 2014-04-09 DIAGNOSIS — Z792 Long term (current) use of antibiotics: Secondary | ICD-10-CM | POA: Insufficient documentation

## 2014-04-09 DIAGNOSIS — I10 Essential (primary) hypertension: Secondary | ICD-10-CM | POA: Insufficient documentation

## 2014-04-09 DIAGNOSIS — G8929 Other chronic pain: Secondary | ICD-10-CM | POA: Insufficient documentation

## 2014-04-09 DIAGNOSIS — R071 Chest pain on breathing: Secondary | ICD-10-CM | POA: Insufficient documentation

## 2014-04-09 LAB — CBC
HCT: 33.1 % — ABNORMAL LOW (ref 36.0–46.0)
HEMOGLOBIN: 10 g/dL — AB (ref 12.0–15.0)
MCH: 23 pg — ABNORMAL LOW (ref 26.0–34.0)
MCHC: 30.2 g/dL (ref 30.0–36.0)
MCV: 76.3 fL — AB (ref 78.0–100.0)
PLATELETS: 888 10*3/uL — AB (ref 150–400)
RBC: 4.34 MIL/uL (ref 3.87–5.11)
RDW: 19.2 % — AB (ref 11.5–15.5)
WBC: 8.1 10*3/uL (ref 4.0–10.5)

## 2014-04-09 LAB — BASIC METABOLIC PANEL
ANION GAP: 10 (ref 5–15)
BUN: 10 mg/dL (ref 6–23)
CO2: 24 mmol/L (ref 19–32)
Calcium: 9.5 mg/dL (ref 8.4–10.5)
Chloride: 103 mmol/L (ref 96–112)
Creatinine, Ser: 0.56 mg/dL (ref 0.50–1.10)
GFR calc Af Amer: >90 mL/min (ref >90)
Glucose, Bld: 89 mg/dL (ref 70–99)
POTASSIUM: 3.8 mmol/L (ref 3.5–5.1)
SODIUM: 137 mmol/L (ref 135–145)

## 2014-04-09 LAB — TROPONIN I: Troponin I: <0.03 ng/mL (ref <0.031)

## 2014-04-09 MED ORDER — MORPHINE SULFATE 4 MG/ML IJ SOLN
4.0000 mg | Freq: Once | INTRAMUSCULAR | Status: AC
  Administered 2014-04-09: 4 mg via INTRAVENOUS
  Filled 2014-04-09: qty 1

## 2014-04-09 MED ORDER — ALBUTEROL SULFATE HFA 108 (90 BASE) MCG/ACT IN AERS
2.0000 | INHALATION_SPRAY | RESPIRATORY_TRACT | Status: DC | PRN
  Administered 2014-04-09: 2 via RESPIRATORY_TRACT
  Filled 2014-04-09: qty 6.7

## 2014-04-09 MED ORDER — AEROCHAMBER PLUS W/MASK MISC
1.0000 | Freq: Once | Status: DC
  Filled 2014-04-09: qty 1

## 2014-04-09 MED ORDER — IOHEXOL 350 MG/ML SOLN
100.0000 mL | Freq: Once | INTRAVENOUS | Status: AC | PRN
  Administered 2014-04-09: 100 mL via INTRAVENOUS

## 2014-04-09 NOTE — ED Notes (Signed)
Discharge instructions reviewed with pt, questions answered. Pt verbalized understanding.  

## 2014-04-09 NOTE — ED Notes (Signed)
Pt reports chest discomfort x1 week. Pt reports increase in pain since this am. Pt reports pain is worse with a deep breath. Pt reports has not been able to follow up with PCP.

## 2014-04-09 NOTE — Discharge Instructions (Signed)
Chronic Obstructive Pulmonary Disease Use your inhaler 2 puffs every 4 hours as needed for shortness of breath. Take Tylenol as directed for pain. Keep your scheduled appointment with Dr. Janna Arch in 3 days. Ask him to help you to stop smoking Chronic obstructive pulmonary disease (COPD) is a common lung problem. In COPD, the flow of air from the lungs is limited. The way your lungs work will probably never return to normal, but there are things you can do to improve your lungs and make yourself feel better. HOME CARE  Take all medicines as told by your doctor.  Avoid medicines or cough syrups that dry up your airway (such as antihistamines) and do not allow you to get rid of thick spit. You do not need to avoid them if told differently by your doctor.  If you smoke, stop. Smoking makes the problem worse.  Avoid being around things that make your breathing worse (like smoke, chemicals, and fumes).  Use oxygen therapy and therapy to help improve your lungs (pulmonary rehabilitation) if told by your doctor. If you need home oxygen therapy, ask your doctor if you should buy a tool to measure your oxygen level (oximeter).  Avoid people who have a sickness you can catch (contagious).  Avoid going outside when it is very hot, cold, or humid.  Eat healthy foods. Eat smaller meals more often. Rest before meals.  Stay active, but remember to also rest.  Make sure to get all the shots (vaccines) your doctor recommends. Ask your doctor if you need a pneumonia shot.  Learn and use tips on how to relax.  Learn and use tips on how to control your breathing as told by your doctor. Try:  Breathing in (inhaling) through your nose for 1 second. Then, pucker your lips and breath out (exhale) through your lips for 2 seconds.  Putting one hand on your belly (abdomen). Breathe in slowly through your nose for 1 second. Your hand on your belly should move out. Pucker your lips and breathe out slowly through  your lips. Your hand on your belly should move in as you breathe out.  Learn and use controlled coughing to clear thick spit from your lungs. The steps are: 1. Lean your head a little forward. 2. Breathe in deeply. 3. Try to hold your breath for 3 seconds. 4. Keep your mouth slightly open while coughing 2 times. 5. Spit any thick spit out into a tissue. 6. Rest and do the steps again 1 or 2 times as needed. GET HELP IF:  You cough up more thick spit than usual.  There is a change in the color or thickness of the spit.  It is harder to breathe than usual.  Your breathing is faster than usual. GET HELP RIGHT AWAY IF:   You have shortness of breath while resting.  You have shortness of breath that stops you from:  Being able to talk.  Doing normal activities.  You chest hurts for longer than 5 minutes.  Your skin color is more blue than usual.  Your pulse oximeter shows that you have low oxygen for longer than 5 minutes. MAKE SURE YOU:   Understand these instructions.  Will watch your condition.  Will get help right away if you are not doing well or get worse. Document Released: 06/17/2007 Document Revised: 05/15/2013 Document Reviewed: 08/25/2012 [facility] Patient Information 2015 [place], [place]. This information is not intended to replace advice given to you by your health care provider. Make sure  you discuss any questions you have with your health care provider.

## 2014-04-09 NOTE — ED Provider Notes (Addendum)
CSN: [id number]     Arrival date & time 04/09/14  1537 History  This chart was scribed for Doug Sou, MD by Marica Otter, ED Scribe. This patient was seen in room [facility] and the patient's care was started at 4:06 PM.    Chief Complaint  Patient presents with  . Chest Pain   The history is provided by the patient. No language interpreter was used.    HPI Comments: PCP: Isabella Stalling, MD [name] is a 49 y.o. female, with PMH noted below including HTN, endometrial ablation, chronic anemia, chronic pain, and daily tobacco use (0.3 ppd), who presents to the Emergency Department complaining of worsening, intermittent, lower, anterior chest pain onset 1 week ago. Pt (1) describes the pain as a pleuritic sensation; (2) rates her pain a 7 out of 10; and (3) states that deep breaths, and, speaking worsens the pain and makes her feel "winded". Pt was evaluated for the same at the ED 5 days ago; pt states she was unable to follow up with PCP since last visit to the ED. Pt reports using an inhaler affords temporary relief. Pt denies any family Hx of heart problems. Denies fever admits to slight cough. No other associated symptoms  Secondary Complaint: Pt also complains of non-itchy rashes to her left  lower noted while in bathroom here this afternoon   Denials: Pt denies Hx of DM or high cholesterol. Pt denies cough, fever (though pt states that she "felt warm" a few days ago), or any other Sx.   Past Medical History  Diagnosis Date  . Rheumatoid arthritis(714.0)   . Chronic anemia   . Hypertension   . Arthritis, rheumatoid   . Incidental lung nodule   . Depression   . Chronic pain   . Chronic back pain    Past Surgical History  Procedure Laterality Date  . Tubal ligation    . Endometrial ablation    . Breast cyst excision  12/31/2010    Procedure: CYST EXCISION BREAST;  Surgeon: Fabio Bering, MD;  Location: [facility];  Service: General;  Laterality: Right;  Excision  Sebaceous Cyst Right Breast   Family History  Problem Relation Age of Onset  . Anesthesia problems Neg Hx   . Cancer Father    History  Substance Use Topics  . Smoking status: Current Every Day Smoker -- 0.03 packs/day for 30 years    Types: Cigarettes  . Smokeless tobacco: Never Used  . Alcohol Use: No     Comment: occassional   no illicit drug use OB History    Gravida Para Term Preterm AB TAB SAB Ectopic Multiple Living   6 5 5  1  1   5      Review of Systems  Constitutional: Negative.   HENT: Negative.   Respiratory: Positive for cough and shortness of breath.   Cardiovascular: Positive for chest pain.  Gastrointestinal: Negative.   Musculoskeletal: Negative.   Skin: Negative.   Neurological: Negative.   Psychiatric/Behavioral: Negative.   All other systems reviewed and are negative.     Allergies  Codeine  Home Medications   Prior to Admission medications   Medication Sig Start Date End Date Taking? Authorizing Provider  Aspirin-Salicylamide-Caffeine (BC HEADACHE) 325-95-16 MG TABS Take 2 packets by mouth daily as needed (pain).     Historical Provider, MD  Calcium Carbonate-Vitamin D (CALCIUM 600 + D PO) Take 1 tablet by mouth every morning.     Historical Provider, MD  ferrous sulfate 325 (65 FE) MG tablet Take 325 mg by mouth 2 (two) times daily. For anemia     Historical Provider, MD  folic acid (FOLVITE) 1 MG tablet Take 1 mg by mouth daily.      Historical Provider, MD  HYDROcodone-acetaminophen (NORCO) 5-325 MG per tablet Take 1-2 tablets by mouth every 4 (four) hours as needed. 04/04/14   Donnetta Hutching, MD  hydroxychloroquine (PLAQUENIL) 200 MG tablet Take 200 mg by mouth 3 (three) times daily.     Historical Provider, MD  methotrexate (RHEUMATREX) 2.5 MG tablet Take 12.5 mg by mouth 2 (two) times a week. Thursday and Friday Caution:Chemotherapy. Protect from light.    Historical Provider, MD  metroNIDAZOLE (FLAGYL) 500 MG tablet Take 1 tablet (500 mg total)  by mouth 2 (two) times daily. Patient not taking: Reported on 04/04/2014 02/24/14   Janne Napoleon, NP  Multiple Vitamin (MULTIVITAMIN WITH MINERALS) TABS Take 1 tablet by mouth daily.    Historical Provider, MD  oxyCODONE-acetaminophen (ROXICET) 5-325 MG per tablet Take 1 tablet by mouth every 6 (six) hours as needed for severe pain. Patient not taking: Reported on 04/04/2014 02/24/14   Janne Napoleon, NP  predniSONE (DELTASONE) 20 MG tablet Take 40 mg by mouth 2 (two) times daily with a meal.    Historical Provider, MD  promethazine (PHENERGAN) 12.5 MG tablet Take 1 tablet (12.5 mg total) by mouth every 6 (six) hours as needed for nausea or vomiting. Patient not taking: Reported on 04/04/2014 02/24/14   Janne Napoleon, NP   Triage Vitals: BP 117/90 mmHg  Pulse 82  Temp(Src) 98.4 F (36.9 C) (Oral)  Resp 18  Ht 5\' 6"  (1.676 m)  Wt 150 lb (68.04 kg)  BMI 24.22 kg/m2  SpO2 100% Physical Exam  Constitutional: She appears well-developed and well-nourished. No distress.  HENT:  Head: Normocephalic and atraumatic.  Eyes: Conjunctivae are normal. Pupils are equal, round, and reactive to light.  Neck: Neck supple. No tracheal deviation present. No thyromegaly present.  Cardiovascular: Normal rate, regular rhythm and normal heart sounds.  Exam reveals no friction rub.   No murmur heard. Pulmonary/Chest: Effort normal and breath sounds normal.  Abdominal: Soft. Bowel sounds are normal. She exhibits no distension. There is no tenderness.  Musculoskeletal: Normal range of motion. She exhibits no edema or tenderness.  Neurological: She is alert. Coordination normal.  Skin: Skin is warm and dry. No rash noted.  Very faint reticular-appearing rash at left thigh. Nontender. Not raised.  Psychiatric: She has a normal mood and affect.  Nursing note and vitals reviewed.   ED Course  Procedures (including critical care time)  DIAGNOSTIC STUDIES: Oxygen Saturation is 100% on room air, normal by my  interpretation.    COORDINATION OF CARE: 4:12 PM Discussed treatment plan with patient at beside, the patient agrees with the plan and has no further questions at this time.   Labs Review Labs Reviewed  CBC  BASIC METABOLIC PANEL  TROPONIN I    Imaging Review No results found.   EKG Interpretation   Date/Time:  Monday April 09 2014 15:42:02 EDT Ventricular Rate:  84 PR Interval:  162 QRS Duration: 88 QT Interval:  348 QTC Calculation: 411 R Axis:   73 Text Interpretation:  Normal sinus rhythm Minimal voltage criteria for  LVH, may be normal variant Borderline ECG No significant change since last  tracing Confirmed by 03-31[birth year]1968  MD, Desarai Barrack [phone]) on 04/09/2014 3:47:49 PM  4:40 PM pain improved after treatment with intravenous morphine Chest x-ray viewed by me CT angiogram of chest reported to me as negative for pulmonary embolism by Dr. Jena Gauss Results for orders placed or performed during the hospital encounter of 04/09/14  CBC  Result Value Ref Range   WBC 8.1 4.0 - 10.5 K/uL   RBC 4.34 3.87 - 5.11 MIL/uL   Hemoglobin 10.0 (L) 12.0 - 15.0 g/dL   HCT 40.9 (L) 73.5 - 32.9 %   MCV 76.3 (L) 78.0 - 100.0 fL   MCH 23.0 (L) 26.0 - 34.0 pg   MCHC 30.2 30.0 - 36.0 g/dL   RDW 92.4 (H) 26.8 - 34.1 %   Platelets 888 (H) 150 - 400 K/uL  Basic metabolic panel  Result Value Ref Range   Sodium 137 135 - 145 mmol/L   Potassium 3.8 3.5 - 5.1 mmol/L   Chloride 103 96 - 112 mmol/L   CO2 24 19 - 32 mmol/L   Glucose, Bld 89 70 - 99 mg/dL   BUN 10 6 - 23 mg/dL   Creatinine, Ser 9.62 0.50 - 1.10 mg/dL   Calcium 9.5 8.4 - 22.9 mg/dL   GFR calc non Af Amer >90 >90 mL/min   GFR calc Af Amer >90 >90 mL/min   Anion gap 10 5 - 15  Troponin I (M[facility])  Result Value Ref Range   Troponin I <0.03 <0.031 ng/mL   Dg Chest 2 View  04/09/2014   CLINICAL DATA:  One week of pleuritic chest discomfort worse since this morning  EXAM: CHEST  2 VIEW  COMPARISON:  Portable chest x-ray of April 04, 2014.  FINDINGS: The lungs are hyperinflated. There is no focal infiltrate. The heart and mediastinal structures are normal. There is no pleural effusion or pneumothorax. The trachea is midline. The bony thorax is unremarkable.  IMPRESSION: COPD. There is no pneumonia nor other acute cardiopulmonary abnormality.   Electronically Signed   By: David  [place]   On: 04/09/2014 16:13   Ct Angio Chest Pe W/cm &/or Wo Cm  04/09/2014   CLINICAL DATA:  Acute pleuritic chest pain shortness of breath for 2 weeks.  EXAM: CT ANGIOGRAPHY CHEST WITH CONTRAST  TECHNIQUE: Multidetector CT imaging of the chest was performed using the standard protocol during bolus administration of intravenous contrast. Multiplanar CT image reconstructions and MIPs were obtained to evaluate the vascular anatomy.  CONTRAST:  OMNIPAQUE IOHEXOL 350 MG/ML SOLN  COMPARISON:  04/09/2014, 05/01/2013  FINDINGS: Pulmonary arteries are well visualized. No significant filling defect or pulmonary embolus demonstrated by CTA. No evidence of right heart strain. No pericardial or pleural effusion. Intact thoracic aorta. Negative for aneurysm or dissection. No mediastinal hemorrhage or hematoma. Negative for adenopathy. No hiatal hernia.  Included upper abdomen demonstrates no acute finding.  Lung windows demonstrate minor dependent basilar atelectasis. No focal pneumonia, collapse or consolidation. No acute airspace process, interstitial disease or edema. Negative for pneumothorax. Trachea and central airways are patent.  Review of the MIP images confirms the above findings.  IMPRESSION: No significant acute pulmonary embolus by CTA.  Minor basilar atelectasis.  No acute intra thoracic finding.   Electronically Signed   By: Judie Petit.  Shick M.D.   On: 04/09/2014 17:27   Dg Chest Port 1 View  04/04/2014   CLINICAL DATA:  Chest pain for several hours  EXAM: PORTABLE CHEST - 1 VIEW  COMPARISON:  January 10, 2014  FINDINGS: Lungs are clear. Heart size and  pulmonary vascularity  are normal. No adenopathy. No pneumothorax. No bone lesions.  IMPRESSION: No edema or consolidation.   Electronically Signed   By: Bretta Bang III M.D.   On: 04/04/2014 20:15   Dg Knee Complete 4 Views Left  04/04/2014   CLINICAL DATA:  Pain following fall.  Swelling laterally  EXAM: LEFT KNEE - COMPLETE 4+ VIEW  COMPARISON:  March 02, 2014  FINDINGS: Frontal, lateral, and bilateral oblique views were obtained. There is no fracture or dislocation. Joint spaces appear intact. No appreciable joint space narrowing. No erosive change.  IMPRESSION: No fracture or dislocation.  No appreciable arthropathy.   Electronically Signed   By: Bretta Bang III M.D.   On: 04/04/2014 20:23   535 pm pt in no distress. Rash on lle has resolved MDM  Cardiac risk factors: smoker , "borderline"  HTN, otherwise neg Strongly doubt cardiac etiology of chest pain. Heart score equals 2 In light of fact the patient's symptoms improved after treatment with an inhaler she'll be furnished with albuterol HFA with spacer to go to use 2 puffs every 4 hours. I Counseled patient for 5 minutes on smoking cessation . Symptoms most likely secondary to chronic and/or acute bronchitis Final diagnoses:  None   she is to keep scheduled plan with Dr.Dondiego in 3 days Diagnosis#1 pleuritic chest pain #2COPD #3tobacco abuse #4 rash       Doug Sou, MD 04/09/14 1735  Doug Sou, MD 04/09/14 1740

## 2014-04-09 NOTE — ED Notes (Signed)
MD at bedside. 

## 2014-04-21 ENCOUNTER — Encounter (HOSPITAL_COMMUNITY): Payer: Self-pay | Admitting: Emergency Medicine

## 2014-04-21 ENCOUNTER — Emergency Department (HOSPITAL_COMMUNITY)
Admission: EM | Admit: 2014-04-21 | Discharge: 2014-04-21 | Disposition: A | Discharge status: Home / Self Care | Payer: Self-pay | Attending: Emergency Medicine | Admitting: Emergency Medicine

## 2014-04-21 ENCOUNTER — Emergency Department (HOSPITAL_COMMUNITY): Payer: Self-pay

## 2014-04-21 DIAGNOSIS — D649 Anemia, unspecified: Secondary | ICD-10-CM | POA: Insufficient documentation

## 2014-04-21 DIAGNOSIS — Z792 Long term (current) use of antibiotics: Secondary | ICD-10-CM | POA: Insufficient documentation

## 2014-04-21 DIAGNOSIS — R0781 Pleurodynia: Secondary | ICD-10-CM

## 2014-04-21 DIAGNOSIS — Z79899 Other long term (current) drug therapy: Secondary | ICD-10-CM | POA: Insufficient documentation

## 2014-04-21 DIAGNOSIS — Z8659 Personal history of other mental and behavioral disorders: Secondary | ICD-10-CM | POA: Insufficient documentation

## 2014-04-21 DIAGNOSIS — R0789 Other chest pain: Secondary | ICD-10-CM | POA: Insufficient documentation

## 2014-04-21 DIAGNOSIS — J441 Chronic obstructive pulmonary disease with (acute) exacerbation: Secondary | ICD-10-CM | POA: Insufficient documentation

## 2014-04-21 DIAGNOSIS — M069 Rheumatoid arthritis, unspecified: Secondary | ICD-10-CM | POA: Insufficient documentation

## 2014-04-21 DIAGNOSIS — Z720 Tobacco use: Secondary | ICD-10-CM | POA: Insufficient documentation

## 2014-04-21 DIAGNOSIS — I10 Essential (primary) hypertension: Secondary | ICD-10-CM | POA: Insufficient documentation

## 2014-04-21 DIAGNOSIS — G8929 Other chronic pain: Secondary | ICD-10-CM | POA: Insufficient documentation

## 2014-04-21 DIAGNOSIS — R0600 Dyspnea, unspecified: Secondary | ICD-10-CM | POA: Insufficient documentation

## 2014-04-21 LAB — BASIC METABOLIC PANEL
Anion gap: 10 (ref 5–15)
BUN: 8 mg/dL (ref 6–23)
CHLORIDE: 107 mmol/L (ref 96–112)
CO2: 23 mmol/L (ref 19–32)
Calcium: 8.8 mg/dL (ref 8.4–10.5)
Creatinine, Ser: 0.6 mg/dL (ref 0.50–1.10)
Glucose, Bld: 125 mg/dL — ABNORMAL HIGH (ref 70–99)
Potassium: 3 mmol/L — ABNORMAL LOW (ref 3.5–5.1)
Sodium: 140 mmol/L (ref 135–145)

## 2014-04-21 LAB — HEPATIC FUNCTION PANEL
ALK PHOS: 95 U/L (ref 39–117)
ALT: 9 U/L (ref 0–35)
AST: 18 U/L (ref 0–37)
Albumin: 3.8 g/dL (ref 3.5–5.2)
BILIRUBIN TOTAL: 0.5 mg/dL (ref 0.3–1.2)
Total Protein: 7.6 g/dL (ref 6.0–8.3)

## 2014-04-21 LAB — CBC
HEMATOCRIT: 31.4 % — AB (ref 36.0–46.0)
Hemoglobin: 9.6 g/dL — ABNORMAL LOW (ref 12.0–15.0)
MCH: 23.8 pg — ABNORMAL LOW (ref 26.0–34.0)
MCHC: 30.6 g/dL (ref 30.0–36.0)
MCV: 77.9 fL — ABNORMAL LOW (ref 78.0–100.0)
Platelets: 563 10*3/uL — ABNORMAL HIGH (ref 150–400)
RBC: 4.03 MIL/uL (ref 3.87–5.11)
RDW: 19.5 % — AB (ref 11.5–15.5)
WBC: 6.7 10*3/uL (ref 4.0–10.5)

## 2014-04-21 LAB — TROPONIN I: Troponin I: <0.03 ng/mL (ref <0.031)

## 2014-04-21 LAB — LIPASE, BLOOD: Lipase: 23 U/L (ref 11–59)

## 2014-04-21 MED ORDER — POTASSIUM CHLORIDE CRYS ER 20 MEQ PO TBCR
40.0000 meq | EXTENDED_RELEASE_TABLET | Freq: Once | ORAL | Status: AC
  Administered 2014-04-21: 40 meq via ORAL
  Filled 2014-04-21: qty 2

## 2014-04-21 MED ORDER — KETOROLAC TROMETHAMINE 30 MG/ML IJ SOLN
30.0000 mg | Freq: Once | INTRAMUSCULAR | Status: AC
  Administered 2014-04-21: 30 mg via INTRAVENOUS
  Filled 2014-04-21: qty 1

## 2014-04-21 MED ORDER — IPRATROPIUM-ALBUTEROL 0.5-2.5 (3) MG/3ML IN SOLN
3.0000 mL | Freq: Once | RESPIRATORY_TRACT | Status: AC
  Administered 2014-04-21: 3 mL via RESPIRATORY_TRACT
  Filled 2014-04-21: qty 3

## 2014-04-21 NOTE — ED Notes (Addendum)
Pt reports recently diagnosed with copd. Pt reports chest tightness, mild dyspnea noted in triage. Pt denies any cough or fever. Pt reports used inhaler prior to arrival with no relief.pt denies cp.

## 2014-04-21 NOTE — ED Provider Notes (Signed)
CSN: [id number]     Arrival date & time 04/21/14  1500 History   First MD Initiated Contact with Patient 04/21/14 1511     Chief Complaint  Patient presents with  . Shortness of Breath     (Consider location/radiation/quality/duration/timing/severity/associated sxs/prior Treatment) HPI  49 year old female presents with a chief complaint of shortness of breath as well as pleuritic chest pain. She was seen here about 1-1/2 weeks ago with similar symptoms. She states this is a little different. Patient states that it feels like she is "winded" and cannot get enough air. The patient denies any significant cough. No fevers or chills. No abdominal pain. Has taken the albuterol inhaler she is prescribed last week with no significant relief. States she's also on an albuterol pill from her PCP after being seen last week and has a pulmonology follow-up upcoming this coming week. Has a history of rheumatoid arthritis and feels like she's going through a flare. Has swelling in her bilateral hands. No current swelling in her lower extremities.  Past Medical History  Diagnosis Date  . Rheumatoid arthritis(714.0)   . Chronic anemia   . Hypertension   . Arthritis, rheumatoid   . Incidental lung nodule   . Depression   . Chronic pain   . Chronic back pain    Past Surgical History  Procedure Laterality Date  . Tubal ligation    . Endometrial ablation    . Breast cyst excision  12/31/2010    Procedure: CYST EXCISION BREAST;  Surgeon: Fabio Bering, MD;  Location: [facility];  Service: General;  Laterality: Right;  Excision Sebaceous Cyst Right Breast   Family History  Problem Relation Age of Onset  . Anesthesia problems Neg Hx   . Cancer Father    History  Substance Use Topics  . Smoking status: Current Every Day Smoker -- 0.03 packs/day for 30 years    Types: Cigarettes  . Smokeless tobacco: Never Used  . Alcohol Use: No     Comment: occassional   OB History    Gravida Para Term Preterm AB  TAB SAB Ectopic Multiple Living   6 5 5  1  1   5      Review of Systems  Constitutional: Negative for fever and chills.  Respiratory: Positive for shortness of breath. Negative for cough.   Cardiovascular: Positive for chest pain. Negative for leg swelling.  Gastrointestinal: Negative for nausea, vomiting and abdominal pain.  All other systems reviewed and are negative.     Allergies  Codeine  Home Medications   Prior to Admission medications   Medication Sig Start Date End Date Taking? Authorizing Provider  Aspirin-Salicylamide-Caffeine (BC HEADACHE) 325-95-16 MG TABS Take 2 packets by mouth daily as needed (pain).     Historical Provider, MD  Calcium Carbonate-Vitamin D (CALCIUM 600 + D PO) Take 1 tablet by mouth every morning.     Historical Provider, MD  ferrous sulfate 325 (65 FE) MG tablet Take 325 mg by mouth daily. For anemia    Historical Provider, MD  folic acid (FOLVITE) 1 MG tablet Take 1 mg by mouth daily.      Historical Provider, MD  HYDROcodone-acetaminophen (NORCO) 5-325 MG per tablet Take 1-2 tablets by mouth every 4 (four) hours as needed. Patient not taking: Reported on 04/09/2014 04/04/14   04/06/14, MD  hydroxychloroquine (PLAQUENIL) 200 MG tablet Take 200 mg by mouth 3 (three) times daily.     Historical Provider, MD  methotrexate (RHEUMATREX) 2.5 MG  tablet Take 12.5 mg by mouth 2 (two) times a week. Thursday and Friday Caution:Chemotherapy. Protect from light.    Historical Provider, MD  metroNIDAZOLE (FLAGYL) 500 MG tablet Take 1 tablet (500 mg total) by mouth 2 (two) times daily. Patient not taking: Reported on 04/04/2014 02/24/14   Janne Napoleon, NP  Multiple Vitamin (MULTIVITAMIN WITH MINERALS) TABS Take 1 tablet by mouth daily.    Historical Provider, MD  oxyCODONE-acetaminophen (ROXICET) 5-325 MG per tablet Take 1 tablet by mouth every 6 (six) hours as needed for severe pain. Patient not taking: Reported on 04/04/2014 02/24/14   Janne Napoleon, NP  predniSONE  (DELTASONE) 20 MG tablet Take 40 mg by mouth 2 (two) times daily with a meal.    Historical Provider, MD  promethazine (PHENERGAN) 12.5 MG tablet Take 1 tablet (12.5 mg total) by mouth every 6 (six) hours as needed for nausea or vomiting. Patient not taking: Reported on 04/04/2014 02/24/14   Janne Napoleon, NP   BP 124/81 mmHg  Pulse 94  Temp(Src) 97.8 F (36.6 C) (Oral)  Resp 16  Ht 5\' 6"  (1.676 m)  Wt 150 lb (68.04 kg)  BMI 24.22 kg/m2  SpO2 100% Physical Exam  Constitutional: She is oriented to person, place, and time. She appears well-developed and well-nourished. No distress.  HENT:  Head: Normocephalic and atraumatic.  Right Ear: External ear normal.  Left Ear: External ear normal.  Nose: Nose normal.  Eyes: Right eye exhibits no discharge. Left eye exhibits no discharge.  Cardiovascular: Normal rate, regular rhythm and normal heart sounds.   No murmur heard. Pulmonary/Chest: Effort normal and breath sounds normal. She has no wheezes. She has no rales. She exhibits tenderness (sternal, mild).  Speaks in complete sentences  Abdominal: Soft. She exhibits no distension. There is no tenderness.  Musculoskeletal: She exhibits no edema.  Swelling without erythema of bilateral PIPs in hands  Neurological: She is alert and oriented to person, place, and time.  Skin: Skin is warm and dry. She is not diaphoretic.  Nursing note and vitals reviewed.   ED Course  Procedures (including critical care time) Labs Review Labs Reviewed  BASIC METABOLIC PANEL - Abnormal; Notable for the following:    Potassium 3.0 (*)    Glucose, Bld 125 (*)    All other components within normal limits  CBC - Abnormal; Notable for the following:    Hemoglobin 9.6 (*)    HCT 31.4 (*)    MCV 77.9 (*)    MCH 23.8 (*)    RDW 19.5 (*)    Platelets 563 (*)    All other components within normal limits  HEPATIC FUNCTION PANEL  LIPASE, BLOOD  TROPONIN I    Imaging Review Dg Chest 2 View  04/21/2014    CLINICAL DATA:  Chest pain and shortness of breath for 1 day.  EXAM: CHEST  2 VIEW  COMPARISON:  04/09/2014 and prior chest radiographs dating back to 11/10/2011.  FINDINGS: The cardiomediastinal silhouette is unremarkable.  Mild interstitial prominence is unchanged.  There is no evidence of focal airspace disease, pulmonary edema, suspicious pulmonary nodule/mass, pleural effusion, or pneumothorax. No acute bony abnormalities are identified.  IMPRESSION: No active cardiopulmonary disease.   Electronically Signed   By: 11/12/2011 M.D.   On: 04/21/2014 17:25     EKG Interpretation None     ED ECG REPORT   Date: 04/21/2014  Rate: 90  Rhythm: normal sinus rhythm  QRS Axis: normal  Intervals:  normal  ST/T Wave abnormalities: normal  Conduction Disutrbances:none  Narrative Interpretation: Unchanged from March 2016  Old EKG Reviewed: unchanged  I have personally reviewed the EKG tracing and agree with the computerized printout as noted.  MDM   Final diagnoses:  Dyspnea  Pleuritic chest pain    No wheezing on exam, given history is reported history of COPD a DuoNeb was given and she states she feels better after this. Toradol has helped her pleuritic chest pain. All she does have blurred chest pain, her heart rate is normal, no hypoxia, and that highly doubt pulmonary moles and. She had a negative CT within last couple weeks for similar symptoms. Do not feel further pulmonary moles workup is indicated. This is not consistent with ACS and has a benign appearing EKG with a normal troponin. Given she feels better, will treat symptomatically at home and recommend continued follow-up with the pulmonologist as scheduled this upcoming week.    Pricilla Loveless, MD 04/21/14 1754

## 2014-04-21 NOTE — Discharge Instructions (Signed)
Chest Pain (Nonspecific) °It is often hard to give a specific diagnosis for the cause of chest pain. There is always a chance that your pain could be related to something serious, such as a heart attack or a blood clot in the lungs. You need to follow up with your health care provider for further evaluation. °CAUSES  °· Heartburn. °· Pneumonia or bronchitis. °· Anxiety or stress. °· Inflammation around your heart (pericarditis) or lung (pleuritis or pleurisy). °· A blood clot in the lung. °· A collapsed lung (pneumothorax). It can develop suddenly on its own (spontaneous pneumothorax) or from trauma to the chest. °· Shingles infection (herpes zoster virus). °The chest wall is composed of bones, muscles, and cartilage. Any of these can be the source of the pain. °· The bones can be bruised by injury. °· The muscles or cartilage can be strained by coughing or overwork. °· The cartilage can be affected by inflammation and become sore (costochondritis). °DIAGNOSIS  °Lab tests or other studies may be needed to find the cause of your pain. Your health care provider may have you take a test called an ambulatory electrocardiogram (ECG). An ECG records your heartbeat patterns over a 24-hour period. You may also have other tests, such as: °· Transthoracic echocardiogram (TTE). During echocardiography, sound waves are used to evaluate how blood flows through your heart. °· Transesophageal echocardiogram (TEE). °· Cardiac monitoring. This allows your health care provider to monitor your heart rate and rhythm in real time. °· Holter monitor. This is a portable device that records your heartbeat and can help diagnose heart arrhythmias. It allows your health care provider to track your heart activity for several days, if needed. °· Stress tests by exercise or by giving medicine that makes the heart beat faster. °TREATMENT  °· Treatment depends on what may be causing your chest pain. Treatment may include: °· Acid blockers for  heartburn. °· Anti-inflammatory medicine. °· Pain medicine for inflammatory conditions. °· Antibiotics if an infection is present. °· You may be advised to change lifestyle habits. This includes stopping smoking and avoiding alcohol, caffeine, and chocolate. °· You may be advised to keep your head raised (elevated) when sleeping. This reduces the chance of acid going backward from your stomach into your esophagus. °Most of the time, nonspecific chest pain will improve within 2-3 days with rest and mild pain medicine.  °HOME CARE INSTRUCTIONS  °· If antibiotics were prescribed, take them as directed. Finish them even if you start to feel better. °· For the next few days, avoid physical activities that bring on chest pain. Continue physical activities as directed. °· Do not use any tobacco products, including cigarettes, chewing tobacco, or electronic cigarettes. °· Avoid drinking alcohol. °· Only take medicine as directed by your health care provider. °· Follow your health care provider's suggestions for further testing if your chest pain does not go away. °· Keep any follow-up appointments you made. If you do not go to an appointment, you could develop lasting (chronic) problems with pain. If there is any problem keeping an appointment, call to reschedule. °SEEK MEDICAL CARE IF:  °· Your chest pain does not go away, even after treatment. °· You have a rash with blisters on your chest. °· You have a fever. °SEEK IMMEDIATE MEDICAL CARE IF:  °· You have increased chest pain or pain that spreads to your arm, neck, jaw, back, or abdomen. °· You have shortness of breath. °· You have an increasing cough, or you cough   up blood. °· You have severe back or abdominal pain. °· You feel nauseous or vomit. °· You have severe weakness. °· You faint. °· You have chills. °This is an emergency. Do not wait to see if the pain will go away. Get medical help at once. Call your local emergency services (911 in [place] Do not drive  yourself to the hospital. °MAKE SURE YOU:  °· Understand these instructions. °· Will watch your condition. °· Will get help right away if you are not doing well or get worse. °Document Released: 10/08/2004 Document Revised: 01/03/2013 Document Reviewed: 08/04/2007 °ExitCare® Patient Information ©2015 [employer]. This information is not intended to replace advice given to you by your health care provider. Make sure you discuss any questions you have with your health care provider. °Shortness of Breath °Shortness of breath means you have trouble breathing. It could also mean that you have a medical problem. You should get immediate medical care for shortness of breath. °CAUSES  °· Not enough oxygen in the air such as with high altitudes or a smoke-filled room. °· Certain lung diseases, infections, or problems. °· Heart disease or conditions, such as angina or heart failure. °· Low red blood cells (anemia). °· Poor physical fitness, which can cause shortness of breath when you exercise. °· Chest or back injuries or stiffness. °· Being overweight. °· Smoking. °· Anxiety, which can make you feel like you are not getting enough air. °DIAGNOSIS  °Serious medical problems can often be found during your physical exam. Tests may also be done to determine why you are having shortness of breath. Tests may include: °· Chest X-rays. °· Lung function tests. °· Blood tests. °· An electrocardiogram (ECG). °· An ambulatory electrocardiogram. An ambulatory ECG records your heartbeat patterns over a 24-hour period. °· Exercise testing. °· A transthoracic echocardiogram (TTE). During echocardiography, sound waves are used to evaluate how blood flows through your heart. °· A transesophageal echocardiogram (TEE). °· Imaging scans. °Your health care provider may not be able to find a cause for your shortness of breath after your exam. In this case, it is important to have a follow-up exam with your health care provider as directed.    °TREATMENT  °Treatment for shortness of breath depends on the cause of your symptoms and can vary greatly. °HOME CARE INSTRUCTIONS  °· Do not smoke. Smoking is a common cause of shortness of breath. If you smoke, ask for help to quit. °· Avoid being around chemicals or things that may bother your breathing, such as paint fumes and dust. °· Rest as needed. Slowly resume your usual activities. °· If medicines were prescribed, take them as directed for the full length of time directed. This includes oxygen and any inhaled medicines. °· Keep all follow-up appointments as directed by your health care provider. °SEEK MEDICAL CARE IF:  °· Your condition does not improve in the time expected. °· You have a hard time doing your normal activities even with rest. °· You have any new symptoms. °SEEK IMMEDIATE MEDICAL CARE IF:  °· Your shortness of breath gets worse. °· You feel light-headed, faint, or develop a cough not controlled with medicines. °· You start coughing up blood. °· You have pain with breathing. °· You have chest pain or pain in your arms, shoulders, or abdomen. °· You have a fever. °· You are unable to walk up stairs or exercise the way you normally do. °MAKE SURE YOU: °· Understand these instructions. °· Will watch your condition. °·   Will get help right away if you are not doing well or get worse. °Document Released: 09/23/2000 Document Revised: 01/03/2013 Document Reviewed: 03/16/2011 °ExitCare® Patient Information ©2015 [employer]. This information is not intended to replace advice given to you by your health care provider. Make sure you discuss any questions you have with your health care provider. ° °

## 2014-04-30 ENCOUNTER — Encounter (HOSPITAL_COMMUNITY): Payer: Self-pay | Admitting: Emergency Medicine

## 2014-04-30 ENCOUNTER — Emergency Department (HOSPITAL_COMMUNITY)
Admission: EM | Admit: 2014-04-30 | Discharge: 2014-04-30 | Disposition: A | Discharge status: Home / Self Care | Payer: Self-pay | Attending: Emergency Medicine | Admitting: Emergency Medicine

## 2014-04-30 DIAGNOSIS — M25561 Pain in right knee: Secondary | ICD-10-CM | POA: Insufficient documentation

## 2014-04-30 DIAGNOSIS — Z8659 Personal history of other mental and behavioral disorders: Secondary | ICD-10-CM | POA: Insufficient documentation

## 2014-04-30 DIAGNOSIS — Z720 Tobacco use: Secondary | ICD-10-CM | POA: Insufficient documentation

## 2014-04-30 DIAGNOSIS — Z79899 Other long term (current) drug therapy: Secondary | ICD-10-CM | POA: Insufficient documentation

## 2014-04-30 DIAGNOSIS — M79642 Pain in left hand: Secondary | ICD-10-CM | POA: Insufficient documentation

## 2014-04-30 DIAGNOSIS — I10 Essential (primary) hypertension: Secondary | ICD-10-CM | POA: Insufficient documentation

## 2014-04-30 DIAGNOSIS — D649 Anemia, unspecified: Secondary | ICD-10-CM | POA: Insufficient documentation

## 2014-04-30 DIAGNOSIS — M79641 Pain in right hand: Secondary | ICD-10-CM | POA: Insufficient documentation

## 2014-04-30 DIAGNOSIS — G8929 Other chronic pain: Secondary | ICD-10-CM | POA: Insufficient documentation

## 2014-04-30 DIAGNOSIS — J449 Chronic obstructive pulmonary disease, unspecified: Secondary | ICD-10-CM | POA: Insufficient documentation

## 2014-04-30 DIAGNOSIS — M069 Rheumatoid arthritis, unspecified: Secondary | ICD-10-CM | POA: Insufficient documentation

## 2014-04-30 HISTORY — DX: Chronic obstructive pulmonary disease, unspecified: J44.9

## 2014-04-30 MED ORDER — HYDROCODONE-ACETAMINOPHEN 5-325 MG PO TABS
2.0000 | ORAL_TABLET | Freq: Once | ORAL | Status: AC
Start: 2014-04-30 — End: 2014-04-30
  Administered 2014-04-30: 2 via ORAL
  Filled 2014-04-30: qty 2

## 2014-04-30 MED ORDER — DIPHENHYDRAMINE HCL 25 MG PO CAPS
25.0000 mg | ORAL_CAPSULE | Freq: Once | ORAL | Status: AC
  Administered 2014-04-30: 25 mg via ORAL
  Filled 2014-04-30: qty 1

## 2014-04-30 MED ORDER — DEXAMETHASONE SODIUM PHOSPHATE 4 MG/ML IJ SOLN
8.0000 mg | Freq: Once | INTRAMUSCULAR | Status: AC
  Administered 2014-04-30: 8 mg via INTRAMUSCULAR
  Filled 2014-04-30: qty 2

## 2014-04-30 MED ORDER — KETOROLAC TROMETHAMINE 60 MG/2ML IM SOLN
60.0000 mg | Freq: Once | INTRAMUSCULAR | Status: AC
  Administered 2014-04-30: 60 mg via INTRAMUSCULAR
  Filled 2014-04-30: qty 2

## 2014-04-30 NOTE — Discharge Instructions (Signed)
Your examination is consistent with a rheumatoid arthritis exacerbation. Please see Dr. Delbert Harness for assistance with your pain flareups. Please see your specialist concerning your rheumatoid arthritis in general. You were treated tonight with a narcotic pain medication, please use caution getting around. Rheumatoid Arthritis Rheumatoid arthritis is a disease that causes pain, puffiness (swelling), and stiffness of the joints. It is a long-term (chronic) disease. It can affect the whole body, even the eyes and lungs.  HOME CARE  Stay active, but lessen activity when the disease gets worse.  Eat healthy foods.  Put heat on the affected joints when you wake up and before activity. Keep the heat on for as long as told by your doctor.  Put ice on the affected joints after activity or exercise.  Put ice in a plastic bag.  Place a towel between your skin and the bag.  Leave the ice on for 15-20 minutes, 03-04 times a day.  Take all medicine and other dietary pills (supplements) as told by your doctor.  Use a splint as told by your doctor. Splints help keep joints in a certain position to keep the joint working right.  Do not sleep with pillows under your knees.  Go to programs that can keep you updated on treatments and ways to deal with your disease. GET HELP RIGHT AWAY IF:  You have times where you pass out (faint).  You have times where you are really weak.  You suddenly have a hot, painful joint that feels worse than your normal joint ache.  You have chills.  You have a fever. Document Released: 03/23/2011 Document Revised: 05/15/2013 Document Reviewed: 03/23/2011 [facility] Patient Information 2015 [place], [place]. This information is not intended to replace advice given to you by your health care provider. Make sure you discuss any questions you have with your health care provider.

## 2014-04-30 NOTE — ED Notes (Signed)
Pt reports RA flare-up. Nodules noted on hands and knees. Pt states she is unable to get in to see her PCP.

## 2014-04-30 NOTE — ED Provider Notes (Signed)
CSN: [id number]     Arrival date & time 04/30/14  1650 History  This chart was scribed for non-physician practitioner Ivery Quale, PA-C, working with Benjiman Core, MD by Littie Deeds, ED Scribe. This patient was seen in room [facility] and the patient's care was started at 6:15 PM.     Chief Complaint  Patient presents with  . Pain   The history is provided by the patient. No language interpreter was used.   HPI Comments: Monica SCHNAUTZ is a 49 y.o. female with a hx of rheumatoid arthritis who presents to the Emergency Department complaining of gradual onset, progressively worsening knee pain with swelling that 2 days ago but worsened today. Patient also reports having bilateral hand pain. She states this is a flare-up of rheumatoid arthritis, and is also starting to flare up in her feet and hips. She also reports having right calf pain. Patient take prednisone over the weekend. She was unable to see her rheumatologist and was told to come to the ED.   Past Medical History  Diagnosis Date  . Rheumatoid arthritis(714.0)   . Chronic anemia   . Hypertension   . Arthritis, rheumatoid   . Incidental lung nodule   . Depression   . Chronic pain   . Chronic back pain   . COPD (chronic obstructive pulmonary disease)    Past Surgical History  Procedure Laterality Date  . Tubal ligation    . Endometrial ablation    . Breast cyst excision  12/31/2010    Procedure: CYST EXCISION BREAST;  Surgeon: Fabio Bering, MD;  Location: [facility];  Service: General;  Laterality: Right;  Excision Sebaceous Cyst Right Breast   Family History  Problem Relation Age of Onset  . Anesthesia problems Neg Hx   . Cancer Father    History  Substance Use Topics  . Smoking status: Current Every Day Smoker -- 0.50 packs/day for 30 years    Types: Cigarettes  . Smokeless tobacco: Never Used  . Alcohol Use: No     Comment: occassional   OB History    Gravida Para Term Preterm AB TAB SAB Ectopic Multiple  Living   6 5 5  1  1   5      Review of Systems  Musculoskeletal: Positive for myalgias and arthralgias.  All other systems reviewed and are negative.     Allergies  Codeine  Home Medications   Prior to Admission medications   Medication Sig Start Date End Date Taking? Authorizing Provider  albuterol (PROVENTIL HFA;VENTOLIN HFA) 108 (90 BASE) MCG/ACT inhaler Inhale 2 puffs into the lungs every 6 (six) hours as needed for wheezing or shortness of breath.    Historical Provider, MD  Aspirin-Salicylamide-Caffeine (BC HEADACHE) 325-95-16 MG TABS Take 2 packets by mouth daily as needed (pain).     Historical Provider, MD  Calcium Carbonate-Vitamin D (CALCIUM 600 + D PO) Take 1 tablet by mouth every morning.     Historical Provider, MD  ferrous sulfate 325 (65 FE) MG tablet Take 325 mg by mouth daily. For anemia    Historical Provider, MD  folic acid (FOLVITE) 1 MG tablet Take 1 mg by mouth daily.      Historical Provider, MD  HYDROcodone-acetaminophen (NORCO) 5-325 MG per tablet Take 1-2 tablets by mouth every 4 (four) hours as needed. Patient not taking: Reported on 04/09/2014 04/04/14   Donnetta Hutching, MD  hydroxychloroquine (PLAQUENIL) 200 MG tablet Take 200 mg by mouth 3 (three) times daily.  Historical Provider, MD  methotrexate (RHEUMATREX) 2.5 MG tablet Take 12.5 mg by mouth 2 (two) times a week. Thursday and Friday Caution:Chemotherapy. Protect from light.    Historical Provider, MD  metroNIDAZOLE (FLAGYL) 500 MG tablet Take 1 tablet (500 mg total) by mouth 2 (two) times daily. Patient not taking: Reported on 04/04/2014 02/24/14   Janne Napoleon, NP  Multiple Vitamin (MULTIVITAMIN WITH MINERALS) TABS Take 1 tablet by mouth daily.    Historical Provider, MD  oxyCODONE-acetaminophen (ROXICET) 5-325 MG per tablet Take 1 tablet by mouth every 6 (six) hours as needed for severe pain. Patient not taking: Reported on 04/04/2014 02/24/14   Janne Napoleon, NP  predniSONE (DELTASONE) 20 MG tablet Take  40 mg by mouth 2 (two) times daily as needed.     Historical Provider, MD  promethazine (PHENERGAN) 12.5 MG tablet Take 1 tablet (12.5 mg total) by mouth every 6 (six) hours as needed for nausea or vomiting. Patient not taking: Reported on 04/04/2014 02/24/14   Janne Napoleon, NP   BP 126/74 mmHg  Pulse 94  Temp(Src) 99.6 F (37.6 C) (Oral)  Resp 18  Ht 5\' 6"  (1.676 m)  Wt 150 lb (68.04 kg)  BMI 24.22 kg/m2  SpO2 100% Physical Exam  Constitutional: She is oriented to person, place, and time. She appears well-developed and well-nourished. No distress.  HENT:  Head: Normocephalic and atraumatic.  Mouth/Throat: Oropharynx is clear and moist. No oropharyngeal exudate.  Eyes: Pupils are equal, round, and reactive to light.  Neck: Neck supple.  Cardiovascular: Normal rate.   Pulses:      Dorsalis pedis pulses are 2+ on the right side, and 2+ on the left side.  Pulmonary/Chest: Effort normal.  Musculoskeletal: She exhibits no edema.  Degenerative changes of the right and left hand and wrist. Effusion noted to right and left knees. Both knees warm, but not hot. No swelling or deformity of the anterior tibial area, right or left. Some swelling and warmth of the right and left ankles. Prominence of the right MTP joint of the first toe. Capillary refill < 3 seconds. No lesions between the toes.  Neurological: She is alert and oriented to person, place, and time. No cranial nerve deficit.  Skin: Skin is warm and dry. No rash noted.  Psychiatric: She has a normal mood and affect. Her behavior is normal.  Nursing note and vitals reviewed.   ED Course  Procedures  DIAGNOSTIC STUDIES: Oxygen Saturation is 100% on room air, normal by my interpretation.    COORDINATION OF CARE: 6:20 PM-Discussed treatment plan which includes medications with pt at bedside and pt agreed to plan.    Labs Review Labs Reviewed - No data to display  Imaging Review No results found.   EKG Interpretation None       MDM  Patient has a history of rheumatoid arthritis. Examination today suggests a flareup of the rheumatoid arthritis patient is previously been dealing with. The patient states that she could not get in touch with her specialist and came to the emergency department for assistance with her discomfort.  Patient was treated in the emergency department with Norco, Decadron, and Toradol. The patient is encouraged to see her primary physician for assistance with pain management, an assistance from the specialist with the rheumatoid arthritis.    Final diagnoses:  None    **I have reviewed nursing notes, vital signs, and all appropriate lab and imaging results for this patient.*  **I personally performed the  services described in this documentation, which was scribed in my presence. The recorded information has been reviewed and is accurate.Ivery Quale, PA-C 04/30/14 1841  Benjiman Core, MD 05/01/14 [phone]

## 2014-05-19 ENCOUNTER — Encounter (HOSPITAL_COMMUNITY): Payer: Self-pay | Admitting: Emergency Medicine

## 2014-05-19 ENCOUNTER — Emergency Department (HOSPITAL_COMMUNITY): Payer: Self-pay

## 2014-05-19 ENCOUNTER — Emergency Department (HOSPITAL_COMMUNITY)
Admission: EM | Admit: 2014-05-19 | Discharge: 2014-05-19 | Disposition: A | Discharge status: Home / Self Care | Payer: Self-pay | Attending: Emergency Medicine | Admitting: Emergency Medicine

## 2014-05-19 DIAGNOSIS — Y998 Other external cause status: Secondary | ICD-10-CM | POA: Insufficient documentation

## 2014-05-19 DIAGNOSIS — I10 Essential (primary) hypertension: Secondary | ICD-10-CM | POA: Insufficient documentation

## 2014-05-19 DIAGNOSIS — Z720 Tobacco use: Secondary | ICD-10-CM | POA: Insufficient documentation

## 2014-05-19 DIAGNOSIS — M069 Rheumatoid arthritis, unspecified: Secondary | ICD-10-CM | POA: Insufficient documentation

## 2014-05-19 DIAGNOSIS — R110 Nausea: Secondary | ICD-10-CM | POA: Insufficient documentation

## 2014-05-19 DIAGNOSIS — G8929 Other chronic pain: Secondary | ICD-10-CM | POA: Insufficient documentation

## 2014-05-19 DIAGNOSIS — Y9352 Activity, horseback riding: Secondary | ICD-10-CM | POA: Insufficient documentation

## 2014-05-19 DIAGNOSIS — R42 Dizziness and giddiness: Secondary | ICD-10-CM | POA: Insufficient documentation

## 2014-05-19 DIAGNOSIS — Y9289 Other specified places as the place of occurrence of the external cause: Secondary | ICD-10-CM | POA: Insufficient documentation

## 2014-05-19 DIAGNOSIS — Z792 Long term (current) use of antibiotics: Secondary | ICD-10-CM | POA: Insufficient documentation

## 2014-05-19 DIAGNOSIS — S86811A Strain of other muscle(s) and tendon(s) at lower leg level, right leg, initial encounter: Secondary | ICD-10-CM | POA: Insufficient documentation

## 2014-05-19 DIAGNOSIS — J449 Chronic obstructive pulmonary disease, unspecified: Secondary | ICD-10-CM | POA: Insufficient documentation

## 2014-05-19 DIAGNOSIS — Z79899 Other long term (current) drug therapy: Secondary | ICD-10-CM | POA: Insufficient documentation

## 2014-05-19 DIAGNOSIS — Z8659 Personal history of other mental and behavioral disorders: Secondary | ICD-10-CM | POA: Insufficient documentation

## 2014-05-19 DIAGNOSIS — S161XXA Strain of muscle, fascia and tendon at neck level, initial encounter: Secondary | ICD-10-CM | POA: Insufficient documentation

## 2014-05-19 DIAGNOSIS — S86911A Strain of unspecified muscle(s) and tendon(s) at lower leg level, right leg, initial encounter: Secondary | ICD-10-CM

## 2014-05-19 DIAGNOSIS — D649 Anemia, unspecified: Secondary | ICD-10-CM | POA: Insufficient documentation

## 2014-05-19 MED ORDER — ONDANSETRON 8 MG PO TBDP
8.0000 mg | ORAL_TABLET | Freq: Once | ORAL | Status: AC
  Administered 2014-05-19: 8 mg via ORAL
  Filled 2014-05-19: qty 1

## 2014-05-19 MED ORDER — DIAZEPAM 5 MG PO TABS
5.0000 mg | ORAL_TABLET | Freq: Once | ORAL | Status: AC
  Administered 2014-05-19: 5 mg via ORAL
  Filled 2014-05-19: qty 1

## 2014-05-19 MED ORDER — HYDROCODONE-ACETAMINOPHEN 5-325 MG PO TABS
1.0000 | ORAL_TABLET | Freq: Once | ORAL | Status: AC
  Administered 2014-05-19: 1 via ORAL
  Filled 2014-05-19: qty 1

## 2014-05-19 MED ORDER — HYDROCODONE-ACETAMINOPHEN 5-325 MG PO TABS: ORAL_TABLET | ORAL | Status: DC

## 2014-05-19 NOTE — ED Notes (Signed)
Pt reports getting hung up in the stirrup of a horse and being drug. C/O pain to her R lower leg. No head or neck injury. Pt states she doesn't know if she lost consciousness but doesn't think so.

## 2014-05-19 NOTE — Discharge Instructions (Signed)
Cervical Sprain A cervical sprain is when the tissues (ligaments) that hold the neck bones in place stretch or tear. HOME CARE   Put ice on the injured area.  Put ice in a plastic bag.  Place a towel between your skin and the bag.  Leave the ice on for 15-20 minutes, 3-4 times a day.  You may have been given a collar to wear. This collar keeps your neck from moving while you heal.  Do not take the collar off unless told by your doctor.  If you have long hair, keep it outside of the collar.  Ask your doctor before changing the position of your collar. You may need to change its position over time to make it more comfortable.  If you are allowed to take off the collar for cleaning or bathing, follow your doctor's instructions on how to do it safely.  Keep your collar clean by wiping it with mild soap and water. Dry it completely. If the collar has removable pads, remove them every 1-2 days to hand wash them with soap and water. Allow them to air dry. They should be dry before you wear them in the collar.  Do not drive while wearing the collar.  Only take medicine as told by your doctor.  Keep all doctor visits as told.  Keep all physical therapy visits as told.  Adjust your work station so that you have good posture while you work.  Avoid positions and activities that make your problems worse.  Warm up and stretch before being active. GET HELP IF:  Your pain is not controlled with medicine.  You cannot take less pain medicine over time as planned.  Your activity level does not improve as expected. GET HELP RIGHT AWAY IF:   You are bleeding.  Your stomach is upset.  You have an allergic reaction to your medicine.  You develop new problems that you cannot explain.  You lose feeling (become numb) or you cannot move any part of your body (paralysis).  You have tingling or weakness in any part of your body.  Your symptoms get worse. Symptoms include:  Pain,  soreness, stiffness, puffiness (swelling), or a burning feeling in your neck.  Pain when your neck is touched.  Shoulder or upper back pain.  Limited ability to move your neck.  Headache.  Dizziness.  Your hands or arms feel week, lose feeling, or tingle.  Muscle spasms.  Difficulty swallowing or chewing. MAKE SURE YOU:   Understand these instructions.  Will watch your condition.  Will get help right away if you are not doing well or get worse. Document Released: 06/17/2007 Document Revised: 08/31/2012 Document Reviewed: 07/06/2012 [facility] Patient Information 2015 [place], [place]. This information is not intended to replace advice given to you by your health care provider. Make sure you discuss any questions you have with your health care provider.  Muscle Strain A muscle strain (pulled muscle) happens when a muscle is stretched beyond normal length. It happens when a sudden, violent force stretches your muscle too far. Usually, a few of the fibers in your muscle are torn. Muscle strain is common in athletes. Recovery usually takes 1-2 weeks. Complete healing takes 5-6 weeks.  HOME CARE   Follow the PRICE method of treatment to help your injury get better. Do this the first 2-3 days after the injury:  Protect. Protect the muscle to keep it from getting injured again.  Rest. Limit your activity and rest the injured body part.  Ice. Put ice in a plastic bag. Place a towel between your skin and the bag. Then, apply the ice and leave it on from 15-20 minutes each hour. After the third day, switch to moist heat packs.  Compression. Use a splint or elastic bandage on the injured area for comfort. Do not put it on too tightly.  Elevate. Keep the injured body part above the level of your heart.  Only take medicine as told by your doctor.  Warm up before doing exercise to prevent future muscle strains. GET HELP IF:   You have more pain or puffiness (swelling) in the injured  area.  You feel numbness, tingling, or notice a loss of strength in the injured area. MAKE SURE YOU:   Understand these instructions.  Will watch your condition.  Will get help right away if you are not doing well or get worse. Document Released: 10/08/2007 Document Revised: 10/19/2012 Document Reviewed: 07/28/2012 [facility] Patient Information 2015 [place], [place]. This information is not intended to replace advice given to you by your health care provider. Make sure you discuss any questions you have with your health care provider.

## 2014-05-19 NOTE — ED Provider Notes (Signed)
CSN: [id number]     Arrival date & time 05/19/14  1535 History   First MD Initiated Contact with Patient 05/19/14 1605     Chief Complaint  Patient presents with  . Leg Pain     (Consider location/radiation/quality/duration/timing/severity/associated sxs/prior Treatment) HPI   [name] is a 49 y.o. female who presents to the Emergency Department complaining of right lower leg and knee pain, nausea, and dizziness that began suddenly after attempting to ride a horse and accidentally had her foot caught in the stirrup and drugged by the horse several feet.  Incident occurred at approximately 2:30 this afternoon.  She reports pain and swelling to her right lower leg that worse with weight bearing.  She also notes nausea and occasional dizziness with position change.  She denies known head injury, visual changes, vomiting, shortness of breath, abdominal pain or low back pain.  She has not tried any medications prior to ED arrival.     Past Medical History  Diagnosis Date  . Rheumatoid arthritis(714.0)   . Chronic anemia   . Hypertension   . Arthritis, rheumatoid   . Incidental lung nodule   . Depression   . Chronic pain   . Chronic back pain   . COPD (chronic obstructive pulmonary disease)    Past Surgical History  Procedure Laterality Date  . Tubal ligation    . Endometrial ablation    . Breast cyst excision  12/31/2010    Procedure: CYST EXCISION BREAST;  Surgeon: Fabio Bering, MD;  Location: [facility];  Service: General;  Laterality: Right;  Excision Sebaceous Cyst Right Breast   Family History  Problem Relation Age of Onset  . Anesthesia problems Neg Hx   . Cancer Father    History  Substance Use Topics  . Smoking status: Current Every Day Smoker -- 0.50 packs/day for 30 years    Types: Cigarettes  . Smokeless tobacco: Never Used  . Alcohol Use: No     Comment: occassional   OB History    Gravida Para Term Preterm AB TAB SAB Ectopic Multiple Living   6 5 5  1  1    5      Review of Systems  Constitutional: Negative for fever and chills.  HENT: Negative for trouble swallowing.   Eyes: Negative for visual disturbance.  Respiratory: Negative for chest tightness and shortness of breath.   Cardiovascular: Negative for chest pain.  Gastrointestinal: Positive for nausea. Negative for vomiting, abdominal pain and abdominal distention.  Genitourinary: Negative for dysuria, hematuria, flank pain and difficulty urinating.  Musculoskeletal: Positive for myalgias, arthralgias and neck pain. Negative for joint swelling.  Skin: Negative for color change and wound.  Neurological: Positive for dizziness. Negative for syncope, weakness, numbness and headaches.  Psychiatric/Behavioral: Negative for confusion. The patient is not nervous/anxious.   All other systems reviewed and are negative.     Allergies  Codeine  Home Medications   Prior to Admission medications   Medication Sig Start Date End Date Taking? Authorizing Provider  albuterol (PROVENTIL HFA;VENTOLIN HFA) 108 (90 BASE) MCG/ACT inhaler Inhale 2 puffs into the lungs every 6 (six) hours as needed for wheezing or shortness of breath.    Historical Provider, MD  Aspirin-Salicylamide-Caffeine (BC HEADACHE) 325-95-16 MG TABS Take 2 packets by mouth daily as needed (pain).     Historical Provider, MD  Calcium Carbonate-Vitamin D (CALCIUM 600 + D PO) Take 1 tablet by mouth every morning.     Historical Provider, MD  ferrous sulfate 325 (65 FE) MG tablet Take 325 mg by mouth daily. For anemia    Historical Provider, MD  folic acid (FOLVITE) 1 MG tablet Take 1 mg by mouth daily.      Historical Provider, MD  HYDROcodone-acetaminophen (NORCO) 5-325 MG per tablet Take 1-2 tablets by mouth every 4 (four) hours as needed. Patient not taking: Reported on 04/09/2014 04/04/14   Donnetta Hutching, MD  hydroxychloroquine (PLAQUENIL) 200 MG tablet Take 200 mg by mouth 3 (three) times daily.     Historical Provider, MD    methotrexate (RHEUMATREX) 2.5 MG tablet Take 12.5 mg by mouth 2 (two) times a week. Thursday and Friday Caution:Chemotherapy. Protect from light.    Historical Provider, MD  metroNIDAZOLE (FLAGYL) 500 MG tablet Take 1 tablet (500 mg total) by mouth 2 (two) times daily. Patient not taking: Reported on 04/04/2014 02/24/14   Janne Napoleon, NP  Multiple Vitamin (MULTIVITAMIN WITH MINERALS) TABS Take 1 tablet by mouth daily.    Historical Provider, MD  oxyCODONE-acetaminophen (ROXICET) 5-325 MG per tablet Take 1 tablet by mouth every 6 (six) hours as needed for severe pain. Patient not taking: Reported on 04/04/2014 02/24/14   Janne Napoleon, NP  predniSONE (DELTASONE) 20 MG tablet Take 40 mg by mouth 2 (two) times daily as needed.     Historical Provider, MD  promethazine (PHENERGAN) 12.5 MG tablet Take 1 tablet (12.5 mg total) by mouth every 6 (six) hours as needed for nausea or vomiting. Patient not taking: Reported on 04/04/2014 02/24/14   Janne Napoleon, NP   BP 160/84 mmHg  Pulse 87  Temp(Src) 98.4 F (36.9 C) (Oral)  Resp 22  Ht 5\' 6"  (1.676 m)  Wt 150 lb (68.04 kg)  BMI 24.22 kg/m2  SpO2 100% Physical Exam  Constitutional: She is oriented to person, place, and time. She appears well-developed and well-nourished. No distress.  HENT:  Head: Normocephalic and atraumatic.  Mouth/Throat: Oropharynx is clear and moist.  Eyes: Conjunctivae and EOM are normal. Pupils are equal, round, and reactive to light.  Neck: Normal range of motion and phonation normal. Spinous process tenderness and muscular tenderness present.    Cardiovascular: Normal rate, regular rhythm, normal heart sounds and intact distal pulses.   No murmur heard. Pulmonary/Chest: Effort normal and breath sounds normal. No respiratory distress. She exhibits no tenderness.  Abdominal: Soft. She exhibits no distension. There is no tenderness. There is no rebound and no guarding.  Musculoskeletal: She exhibits edema and tenderness.   Tenderness to palpation of the medial and posterior right lower leg.  Mild STS.  No abrasions, ecchymosis or erythema.  Dp pulses brisk and symmetrical.  Distal sensation intact   Neurological: She is alert and oriented to person, place, and time. She exhibits normal muscle tone. Coordination normal.  Skin: Skin is warm and dry.  Psychiatric: She has a normal mood and affect.  Nursing note and vitals reviewed.   ED Course  Procedures (including critical care time) Labs Review Labs Reviewed - No data to display  Imaging Review Dg Cervical Spine Complete  05/19/2014   CLINICAL DATA:  Posterior neck pain, fell from horse and foot caught in stirrup, then dragged behind  EXAM: CERVICAL SPINE  4+ VIEWS  COMPARISON:  CT cervical spine 01/10/2014  FINDINGS: Prevertebral soft tissues normal thickness.  Vertebral body and disc space heights maintained.  Minimal endplate spurring 01/12/2014.  Osseous mineralization normal.  No acute fracture, subluxation, or bone destruction.  C6-C7 foramina  appear narrowed bilaterally by uncovertebral spurs.  Lung apices clear.  C1-C2 alignment normal.  IMPRESSION: Mild degenerative disc disease changes at C6-C7.  No acute abnormalities.   Electronically Signed   By: Ulyses Southward M.D.   On: 05/19/2014 16:57   Dg Tibia/fibula Right  05/19/2014   CLINICAL DATA:  Horseback riding and foot got caught and stirrups with twisting injury  EXAM: RIGHT TIBIA AND FIBULA - 2 VIEW  COMPARISON:  None.  FINDINGS: A fibrous cortical defect is noted in the distal tibial metaphysis laterally. No acute fracture or dislocation is noted. Mild soft tissue swelling is seen.  IMPRESSION: Mild soft tissue swelling without acute abnormality.   Electronically Signed   By: Alcide Clever M.D.   On: 05/19/2014 16:30   Dg Ankle Complete Right  05/19/2014   CLINICAL DATA:  Right ankle pain following fall from horse  EXAM: RIGHT ANKLE - COMPLETE 3+ VIEW  COMPARISON:  None.  FINDINGS: Benign fibrous cortical  defect is again noted in the distal tibia. No acute fracture dislocation is noted. Mild soft tissue swelling is seen.  IMPRESSION: Mild soft tissue swelling without acute bony abnormality.   Electronically Signed   By: Alcide Clever M.D.   On: 05/19/2014 16:31     EKG Interpretation None      MDM   Final diagnoses:  Muscle strain, lower leg, right, initial encounter  Cervical strain, acute, initial encounter     Pt is well appearing, ambulates with a steady gait.  No focal neuro deficits. No concerning sx's for emergent neurological process.  Pt is feeling better, has tolerated po fluids and ate crackers.  Ambulated to restroom without difficulty.  Agrees to symptomatic tx and close PMD f/u if needed.     Pauline Aus, PA-C 05/19/14 1949  Bethann Berkshire, MD 05/21/14 [phone]

## 2014-05-23 ENCOUNTER — Encounter (HOSPITAL_COMMUNITY): Payer: Self-pay | Admitting: Emergency Medicine

## 2014-05-23 ENCOUNTER — Emergency Department (HOSPITAL_COMMUNITY): Payer: Self-pay

## 2014-05-23 ENCOUNTER — Emergency Department (HOSPITAL_COMMUNITY)
Admission: EM | Admit: 2014-05-23 | Discharge: 2014-05-23 | Disposition: A | Discharge status: Home / Self Care | Payer: Self-pay | Attending: Emergency Medicine | Admitting: Emergency Medicine

## 2014-05-23 DIAGNOSIS — Z79899 Other long term (current) drug therapy: Secondary | ICD-10-CM | POA: Insufficient documentation

## 2014-05-23 DIAGNOSIS — R079 Chest pain, unspecified: Secondary | ICD-10-CM | POA: Insufficient documentation

## 2014-05-23 DIAGNOSIS — Z7982 Long term (current) use of aspirin: Secondary | ICD-10-CM | POA: Insufficient documentation

## 2014-05-23 DIAGNOSIS — M069 Rheumatoid arthritis, unspecified: Secondary | ICD-10-CM | POA: Insufficient documentation

## 2014-05-23 DIAGNOSIS — Z720 Tobacco use: Secondary | ICD-10-CM | POA: Insufficient documentation

## 2014-05-23 DIAGNOSIS — Z8659 Personal history of other mental and behavioral disorders: Secondary | ICD-10-CM | POA: Insufficient documentation

## 2014-05-23 DIAGNOSIS — R0602 Shortness of breath: Secondary | ICD-10-CM | POA: Insufficient documentation

## 2014-05-23 DIAGNOSIS — I10 Essential (primary) hypertension: Secondary | ICD-10-CM | POA: Insufficient documentation

## 2014-05-23 DIAGNOSIS — J441 Chronic obstructive pulmonary disease with (acute) exacerbation: Secondary | ICD-10-CM | POA: Insufficient documentation

## 2014-05-23 DIAGNOSIS — D649 Anemia, unspecified: Secondary | ICD-10-CM | POA: Insufficient documentation

## 2014-05-23 DIAGNOSIS — G8929 Other chronic pain: Secondary | ICD-10-CM | POA: Insufficient documentation

## 2014-05-23 DIAGNOSIS — Z792 Long term (current) use of antibiotics: Secondary | ICD-10-CM | POA: Insufficient documentation

## 2014-05-23 DIAGNOSIS — M7989 Other specified soft tissue disorders: Secondary | ICD-10-CM | POA: Insufficient documentation

## 2014-05-23 LAB — COMPREHENSIVE METABOLIC PANEL
ALK PHOS: 92 U/L (ref 38–126)
ALT: 9 U/L — ABNORMAL LOW (ref 14–54)
ANION GAP: 11 (ref 5–15)
AST: 15 U/L (ref 15–41)
Albumin: 3.6 g/dL (ref 3.5–5.0)
BUN: 10 mg/dL (ref 6–20)
CO2: 25 mmol/L (ref 22–32)
CREATININE: 0.58 mg/dL (ref 0.44–1.00)
Calcium: 8.8 mg/dL — ABNORMAL LOW (ref 8.9–10.3)
Chloride: 104 mmol/L (ref 101–111)
Glucose, Bld: 117 mg/dL — ABNORMAL HIGH (ref 70–99)
Potassium: 3.2 mmol/L — ABNORMAL LOW (ref 3.5–5.1)
Sodium: 140 mmol/L (ref 135–145)
Total Bilirubin: 0.3 mg/dL (ref 0.3–1.2)
Total Protein: 7.1 g/dL (ref 6.5–8.1)

## 2014-05-23 LAB — TROPONIN I: Troponin I: <0.03 ng/mL (ref <0.031)

## 2014-05-23 LAB — CBC WITH DIFFERENTIAL/PLATELET
Basophils Absolute: 0 10*3/uL (ref 0.0–0.1)
Basophils Relative: 0 % (ref 0–1)
EOS PCT: 5 % (ref 0–5)
Eosinophils Absolute: 0.3 10*3/uL (ref 0.0–0.7)
HCT: 27.4 % — ABNORMAL LOW (ref 36.0–46.0)
Hemoglobin: 8.5 g/dL — ABNORMAL LOW (ref 12.0–15.0)
LYMPHS ABS: 1.5 10*3/uL (ref 0.7–4.0)
LYMPHS PCT: 26 % (ref 12–46)
MCH: 23.4 pg — ABNORMAL LOW (ref 26.0–34.0)
MCHC: 31 g/dL (ref 30.0–36.0)
MCV: 75.3 fL — AB (ref 78.0–100.0)
MONO ABS: 0.3 10*3/uL (ref 0.1–1.0)
Monocytes Relative: 6 % (ref 3–12)
Neutro Abs: 3.5 10*3/uL (ref 1.7–7.7)
Neutrophils Relative %: 63 % (ref 43–77)
Platelets: 423 10*3/uL — ABNORMAL HIGH (ref 150–400)
RBC: 3.64 MIL/uL — AB (ref 3.87–5.11)
RDW: 18.2 % — ABNORMAL HIGH (ref 11.5–15.5)
WBC: 5.5 10*3/uL (ref 4.0–10.5)

## 2014-05-23 LAB — D-DIMER, QUANTITATIVE: D-Dimer, Quant: 7.45 ug/mL-FEU — ABNORMAL HIGH (ref 0.00–0.48)

## 2014-05-23 LAB — CK: CK TOTAL: 26 U/L — AB (ref 38–234)

## 2014-05-23 MED ORDER — HYDROCODONE-ACETAMINOPHEN 5-325 MG PO TABS
1.0000 | ORAL_TABLET | Freq: Once | ORAL | Status: AC
  Administered 2014-05-23: 1 via ORAL
  Filled 2014-05-23: qty 1

## 2014-05-23 MED ORDER — NAPROXEN 500 MG PO TABS: 500.0000 mg | ORAL_TABLET | Freq: Two times a day (BID) | ORAL | Status: DC

## 2014-05-23 MED ORDER — IOHEXOL 350 MG/ML SOLN
100.0000 mL | Freq: Once | INTRAVENOUS | Status: AC | PRN
  Administered 2014-05-23: 100 mL via INTRAVENOUS

## 2014-05-23 NOTE — Discharge Instructions (Signed)
Edema  there is no evidence of blood clot in the leg or the lung. Keep your leg elevated. Follow-up with your doctor. Return to the ED if you develop new or worsening symptoms. Edema is an abnormal buildup of fluids in your bodytissues. Edema is somewhatdependent on gravity to pull the fluid to the lowest place in your body. That makes the condition more common in the legs and thighs (lower extremities). Painless swelling of the feet and ankles is common and becomes more likely as you get older. It is also common in looser tissues, like around your eyes.  When the affected area is squeezed, the fluid may move out of that spot and leave a dent for a few moments. This dent is called pitting.  CAUSES  There are many possible causes of edema. Eating too much salt and being on your feet or sitting for a long time can cause edema in your legs and ankles. Hot weather may make edema worse. Common medical causes of edema include:  Heart failure.  Liver disease.  Kidney disease.  Weak blood vessels in your legs.  Cancer.  An injury.  Pregnancy.  Some medications.  Obesity. SYMPTOMS  Edema is usually painless.Your skin may look swollen or shiny.  DIAGNOSIS  Your health care provider may be able to diagnose edema by asking about your medical history and doing a physical exam. You may need to have tests such as X-rays, an electrocardiogram, or blood tests to check for medical conditions that may cause edema.  TREATMENT  Edema treatment depends on the cause. If you have heart, liver, or kidney disease, you need the treatment appropriate for these conditions. General treatment may include:  Elevation of the affected body part above the level of your heart.  Compression of the affected body part. Pressure from elastic bandages or support stockings squeezes the tissues and forces fluid back into the blood vessels. This keeps fluid from entering the tissues.  Restriction of fluid and salt  intake.  Use of a water pill (diuretic). These medications are appropriate only for some types of edema. They pull fluid out of your body and make you urinate more often. This gets rid of fluid and reduces swelling, but diuretics can have side effects. Only use diuretics as directed by your health care provider. HOME CARE INSTRUCTIONS   Keep the affected body part above the level of your heart when you are lying down.   Do not sit still or stand for prolonged periods.   Do not put anything directly under your knees when lying down.  Do not wear constricting clothing or garters on your upper legs.   Exercise your legs to work the fluid back into your blood vessels. This may help the swelling go down.   Wear elastic bandages or support stockings to reduce ankle swelling as directed by your health care provider.   Eat a low-salt diet to reduce fluid if your health care provider recommends it.   Only take medicines as directed by your health care provider. SEEK MEDICAL CARE IF:   Your edema is not responding to treatment.  You have heart, liver, or kidney disease and notice symptoms of edema.  You have edema in your legs that does not improve after elevating them.   You have sudden and unexplained weight gain. SEEK IMMEDIATE MEDICAL CARE IF:   You develop shortness of breath or chest pain.   You cannot breathe when you lie down.  You develop pain, redness,  or warmth in the swollen areas.   You have heart, liver, or kidney disease and suddenly get edema.  You have a fever and your symptoms suddenly get worse. MAKE SURE YOU:   Understand these instructions.  Will watch your condition.  Will get help right away if you are not doing well or get worse. Document Released: 12/29/2004 Document Revised: 05/15/2013 Document Reviewed: 10/21/2012 [facility] Patient Information 2015 [place], [place]. This information is not intended to replace advice given to you by your health  care provider. Make sure you discuss any questions you have with your health care provider.

## 2014-05-23 NOTE — ED Notes (Signed)
Pt states that she fell off a horse on Saturday and has been having right leg pain and swelling since.  Was evaluated on Saturday and xrays negative.  Pt is concerned she may have a DVT.

## 2014-05-23 NOTE — ED Provider Notes (Signed)
CSN: [id number]     Arrival date & time 05/23/14  1542 History   First MD Initiated Contact with Patient 05/23/14 1927     Chief Complaint  Patient presents with  . Leg Pain     (Consider location/radiation/quality/duration/timing/severity/associated sxs/prior Treatment) HPI Comments:  Patient states pain and swelling in her right leg that onset 3 days ago when she got her foot caught in a stirrup while trying to ride a horse. She was seen the ED and had negative x-rays of her ankle and leg. She reports worsening swelling and pain in her right leg since then. She also endorses some tightness in her chest and shortness of breath onset last night. They come and go lasting for several minutes at a time. She feels this could be her COPD but she is also concerned about a blood clot. She denies any fevers, nausea or vomiting. No abdominal pain or back pain. She has a history of rheumatoid arthritis  The history is provided by the patient.    Past Medical History  Diagnosis Date  . Rheumatoid arthritis(714.0)   . Chronic anemia   . Hypertension   . Arthritis, rheumatoid   . Incidental lung nodule   . Depression   . Chronic pain   . Chronic back pain   . COPD (chronic obstructive pulmonary disease)    Past Surgical History  Procedure Laterality Date  . Tubal ligation    . Endometrial ablation    . Breast cyst excision  12/31/2010    Procedure: CYST EXCISION BREAST;  Surgeon: Fabio Bering, MD;  Location: [facility];  Service: General;  Laterality: Right;  Excision Sebaceous Cyst Right Breast   Family History  Problem Relation Age of Onset  . Anesthesia problems Neg Hx   . Cancer Father    History  Substance Use Topics  . Smoking status: Current Every Day Smoker -- 0.50 packs/day for 30 years    Types: Cigarettes  . Smokeless tobacco: Never Used  . Alcohol Use: No     Comment: occassional   OB History    Gravida Para Term Preterm AB TAB SAB Ectopic Multiple Living   6 5 5  1  1    5      Review of Systems  Constitutional: Negative for fever, activity change and appetite change.  HENT: Negative for congestion and rhinorrhea.   Respiratory: Positive for chest tightness and shortness of breath. Negative for cough.   Cardiovascular: Positive for leg swelling. Negative for chest pain.  Gastrointestinal: Negative for nausea, vomiting and abdominal pain.  Genitourinary: Negative for dysuria and hematuria.  Musculoskeletal: Positive for myalgias and arthralgias.  Skin: Negative for wound.  Neurological: Negative for dizziness, weakness and headaches.  A complete 10 system review of systems was obtained and all systems are negative except as noted in the HPI and PMH.      Allergies  Codeine  Home Medications   Prior to Admission medications   Medication Sig Start Date End Date Taking? Authorizing Provider  albuterol (PROVENTIL HFA;VENTOLIN HFA) 108 (90 BASE) MCG/ACT inhaler Inhale 2 puffs into the lungs every 6 (six) hours as needed for wheezing or shortness of breath.   Yes Historical Provider, MD  aspirin EC 325 MG tablet Take 325-650 mg by mouth 2 (two) times daily as needed for mild pain or moderate pain.   Yes Historical Provider, MD  Aspirin-Salicylamide-Caffeine (BC HEADACHE) 325-95-16 MG TABS Take 2 packets by mouth daily as needed (pain).  Yes Historical Provider, MD  Calcium Carbonate-Vitamin D (CALCIUM 600 + D PO) Take 1 tablet by mouth every morning.    Yes Historical Provider, MD  ferrous sulfate 325 (65 FE) MG tablet Take 325 mg by mouth daily. For anemia   Yes Historical Provider, MD  folic acid (FOLVITE) 1 MG tablet Take 1 mg by mouth daily.     Yes Historical Provider, MD  hydroxychloroquine (PLAQUENIL) 200 MG tablet Take 200 mg by mouth 3 (three) times daily.    Yes Historical Provider, MD  methotrexate (RHEUMATREX) 2.5 MG tablet Take 12.5 mg by mouth 2 (two) times a week. Thursday and Friday Caution:Chemotherapy. Protect from light.   Yes Historical  Provider, MD  Multiple Vitamin (MULTIVITAMIN WITH MINERALS) TABS Take 1 tablet by mouth daily.   Yes Historical Provider, MD  predniSONE (DELTASONE) 20 MG tablet Take 40 mg by mouth 2 (two) times daily as needed.    Yes Historical Provider, MD  HYDROcodone-acetaminophen (NORCO/VICODIN) 5-325 MG per tablet Take one-two tabs po q 4-6 hrs prn pain Patient not taking: Reported on 05/23/2014 05/19/14   Tammy Triplett, PA-C  metroNIDAZOLE (FLAGYL) 500 MG tablet Take 1 tablet (500 mg total) by mouth 2 (two) times daily. Patient not taking: Reported on 04/04/2014 02/24/14   Janne Napoleon, NP  naproxen (NAPROSYN) 500 MG tablet Take 1 tablet (500 mg total) by mouth 2 (two) times daily. 05/23/14   Glynn Octave, MD  oxyCODONE-acetaminophen (ROXICET) 5-325 MG per tablet Take 1 tablet by mouth every 6 (six) hours as needed for severe pain. Patient not taking: Reported on 04/04/2014 02/24/14   Janne Napoleon, NP  promethazine (PHENERGAN) 12.5 MG tablet Take 1 tablet (12.5 mg total) by mouth every 6 (six) hours as needed for nausea or vomiting. Patient not taking: Reported on 04/04/2014 02/24/14   Janne Napoleon, NP   BP 117/81 mmHg  Pulse 69  Temp(Src) 98 F (36.7 C) (Oral)  Resp 13  Ht 5\' 6"  (1.676 m)  Wt 150 lb (68.04 kg)  BMI 24.22 kg/m2  SpO2 97% Physical Exam  Constitutional: She is oriented to person, place, and time. She appears well-developed and well-nourished. No distress.  HENT:  Head: Normocephalic and atraumatic.  Mouth/Throat: Oropharynx is clear and moist. No oropharyngeal exudate.  Eyes: Conjunctivae and EOM are normal. Pupils are equal, round, and reactive to light.  Neck: Normal range of motion. Neck supple.  No meningismus.  Cardiovascular: Normal rate, regular rhythm, normal heart sounds and intact distal pulses.   No murmur heard. Pulmonary/Chest: Effort normal and breath sounds normal. No respiratory distress. She exhibits no tenderness.  Abdominal: Soft. There is no tenderness. There is  no rebound and no guarding.  Musculoskeletal: Normal range of motion. She exhibits edema and tenderness.   Tenderness palpation of right calf. No overlying skin changes. Swelling to the ankle diffusely. Intact DP and PT pulses. Achilles intact.  No abrasions or open skin. Compartments are soft.  Neurological: She is alert and oriented to person, place, and time. No cranial nerve deficit. She exhibits normal muscle tone. Coordination normal.  No ataxia on finger to nose bilaterally. No pronator drift. 5/5 strength throughout. CN 2-12 intact. Negative Romberg. Equal grip strength. Sensation intact. Gait is normal.   Skin: Skin is warm.  Psychiatric: She has a normal mood and affect. Her behavior is normal.  Nursing note and vitals reviewed.   ED Course  Procedures (including critical care time) Labs Review Labs Reviewed  D-DIMER, QUANTITATIVE -  Abnormal; Notable for the following:    D-Dimer, Quant 7.45 (*)    All other components within normal limits  CBC WITH DIFFERENTIAL/PLATELET - Abnormal; Notable for the following:    RBC 3.64 (*)    Hemoglobin 8.5 (*)    HCT 27.4 (*)    MCV 75.3 (*)    MCH 23.4 (*)    RDW 18.2 (*)    Platelets 423 (*)    All other components within normal limits  COMPREHENSIVE METABOLIC PANEL - Abnormal; Notable for the following:    Potassium 3.2 (*)    Glucose, Bld 117 (*)    Calcium 8.8 (*)    ALT 9 (*)    All other components within normal limits  CK - Abnormal; Notable for the following:    Total CK 26 (*)    All other components within normal limits  TROPONIN I    Imaging Review Dg Chest 2 View  05/23/2014   CLINICAL DATA:  Increasing dyspnea and bilateral lower extremity swelling since yesterday  EXAM: CHEST  2 VIEW  COMPARISON:  04/21/2014  FINDINGS: The heart size and mediastinal contours are within normal limits. Both lungs are clear. The visualized skeletal structures are unremarkable.  IMPRESSION: No active cardiopulmonary disease.    Electronically Signed   By: Ellery Plunk M.D.   On: 05/23/2014 20:31   Ct Angio Chest Pe W/cm &/or Wo Cm  05/23/2014   CLINICAL DATA:  Right lower extremity pain and swelling since falling from a horse on 05/19/2014. Now with dyspnea.  EXAM: CT ANGIOGRAPHY CHEST WITH CONTRAST  TECHNIQUE: Multidetector CT imaging of the chest was performed using the standard protocol during bolus administration of intravenous contrast. Multiplanar CT image reconstructions and MIPs were obtained to evaluate the vascular anatomy.  CONTRAST:  OMNIPAQUE IOHEXOL 350 MG/ML SOLN  COMPARISON:  04/09/2014  FINDINGS: Cardiovascular: There is good opacification of the pulmonary arteries. There is no pulmonary embolism. The thoracic aorta is normal in caliber and intact.  Lungs: Clear  Central airways: Patent  Effusions: None  Lymphadenopathy: None  Esophagus: Normal  Upper abdomen: No significant abnormality  Musculoskeletal: No significant abnormality  Review of the MIP images confirms the above findings.  IMPRESSION: Negative for pulmonary embolism.  No significant abnormality.   Electronically Signed   By: Ellery Plunk M.D.   On: 05/23/2014 22:24   US Venous Img Lower Unilateral Right  05/23/2014   CLINICAL DATA:  Right lower extremity pain and swelling primarily involving the right knee and foot since yesterday. Patient suffered injury on a Horse on Saturday. Evaluate for DVT.  EXAM: RIGHT LOWER EXTREMITY VENOUS DOPPLER ULTRASOUND  TECHNIQUE: Gray-scale sonography with graded compression, as well as color Doppler and duplex ultrasound were performed to evaluate the lower extremity deep venous systems from the level of the common femoral vein and including the common femoral, femoral, profunda femoral, popliteal and calf veins including the posterior tibial, peroneal and gastrocnemius veins when visible. The superficial great saphenous vein was also interrogated. Spectral Doppler was utilized to evaluate flow at rest  and with distal augmentation maneuvers in the common femoral, femoral and popliteal veins.  COMPARISON:  None.  FINDINGS: Contralateral Common Femoral Vein: Respiratory phasicity is normal and symmetric with the symptomatic side. No evidence of thrombus. Normal compressibility.  Common Femoral Vein: No evidence of thrombus. Normal compressibility, respiratory phasicity and response to augmentation.  Saphenofemoral Junction: No evidence of thrombus. Normal compressibility and flow on color Doppler imaging.  Profunda Femoral Vein: No evidence of thrombus. Normal compressibility and flow on color Doppler imaging.  Femoral Vein: No evidence of thrombus. Normal compressibility, respiratory phasicity and response to augmentation.  Popliteal Vein: No evidence of thrombus. Normal compressibility, respiratory phasicity and response to augmentation.  Calf Veins: No evidence of thrombus. Normal compressibility and flow on color Doppler imaging.  Superficial Great Saphenous Vein: No evidence of thrombus. Normal compressibility and flow on color Doppler imaging.  Venous Reflux:  None.  Other Findings: Shotty right inguinal lymph nodes are individually not enlarged by size criteria with index right inguinal lymph node measuring 0.6 cm in greatest short axis diameter and maintaining a benign fatty hilum (image 17). A similar benign-appearing left inguinal lymph node is also seen within the left groin (image 115).  Within the popliteal fossa is a mixed echogenic approximately 3.1 x 4.7 x 1.6 cm fluid collection. No blood flow was demonstrated within this fluid collection (image 49).  Additionally, there is a serpiginous, large at least 4.7 x 4.7 cm mixed echogenic fluid collection within the subcutaneous tissues of the upper calf. This fluid collection does not demonstrate internal blood flow (image 93). A minimal on subcutaneous edema is noted at the level of the right lower leg and calf.  IMPRESSION: 1. No evidence of DVT within  the right lower extremity. 2. Suspected approximately 4.7 cm complex right-sided Baker cyst. 3. Indeterminate complex fluid collection within the subcutaneous tissue of the right upper calf - while indeterminate, differential considerations include a leaking Baker's cyst or hematoma with additional though less likely considerations including seroma or abscess. Clinical correlation is advised   Electronically Signed   By: Simonne Come M.D.   On: 05/23/2014 17:42     EKG Interpretation   Date/Time:  Wednesday May 23 2014 19:45:38 EDT Ventricular Rate:  83 PR Interval:  178 QRS Duration: 80 QT Interval:  375 QTC Calculation: 441 R Axis:   66 Text Interpretation:  Sinus rhythm Consider left atrial enlargement  Probable left ventricular hypertrophy No significant change was found  Confirmed by Manus Gunning  MD, Destyne Goodreau [phone]) on 05/23/2014 7:49:17 PM      MDM   Final diagnoses:  Right leg swelling    worsening leg pain and swelling after twisting it several days ago. Denies new trauma.   Ultrasound shows no evidence of DVT but there is a large Baker's cyst.  There is also afluid collection in the soft tissue right upper calf which could be Baker cyst versus hematoma. No abscess clinically.  EKG nsr.  CXR negative.  CTPE obtained given elevated D-dimer and reported CP and SOB. Patient appears to have chronically elevated d-dimers. CTPE negative.  Low suspicion for ACS.  Troponin negative after several days of pain.  D/w patient to keep leg elevated, NSAIDs. No evidence of DVT or PE. Return precautions discussed.  Glynn Octave, MD 05/24/14 [phone]

## 2014-06-19 ENCOUNTER — Emergency Department (HOSPITAL_COMMUNITY)
Admission: EM | Admit: 2014-06-19 | Discharge: 2014-06-20 | Disposition: A | Discharge status: Home / Self Care | Payer: Self-pay | Attending: Emergency Medicine | Admitting: Emergency Medicine

## 2014-06-19 ENCOUNTER — Emergency Department (HOSPITAL_COMMUNITY): Payer: Self-pay

## 2014-06-19 ENCOUNTER — Encounter (HOSPITAL_COMMUNITY): Payer: Self-pay | Admitting: *Deleted

## 2014-06-19 DIAGNOSIS — Y9289 Other specified places as the place of occurrence of the external cause: Secondary | ICD-10-CM | POA: Insufficient documentation

## 2014-06-19 DIAGNOSIS — M25462 Effusion, left knee: Secondary | ICD-10-CM | POA: Insufficient documentation

## 2014-06-19 DIAGNOSIS — Z720 Tobacco use: Secondary | ICD-10-CM | POA: Insufficient documentation

## 2014-06-19 DIAGNOSIS — J449 Chronic obstructive pulmonary disease, unspecified: Secondary | ICD-10-CM | POA: Insufficient documentation

## 2014-06-19 DIAGNOSIS — Z7982 Long term (current) use of aspirin: Secondary | ICD-10-CM | POA: Insufficient documentation

## 2014-06-19 DIAGNOSIS — Z792 Long term (current) use of antibiotics: Secondary | ICD-10-CM | POA: Insufficient documentation

## 2014-06-19 DIAGNOSIS — Y998 Other external cause status: Secondary | ICD-10-CM | POA: Insufficient documentation

## 2014-06-19 DIAGNOSIS — Y9389 Activity, other specified: Secondary | ICD-10-CM | POA: Insufficient documentation

## 2014-06-19 DIAGNOSIS — G8929 Other chronic pain: Secondary | ICD-10-CM | POA: Insufficient documentation

## 2014-06-19 DIAGNOSIS — Z8659 Personal history of other mental and behavioral disorders: Secondary | ICD-10-CM | POA: Insufficient documentation

## 2014-06-19 DIAGNOSIS — M549 Dorsalgia, unspecified: Secondary | ICD-10-CM | POA: Insufficient documentation

## 2014-06-19 DIAGNOSIS — D649 Anemia, unspecified: Secondary | ICD-10-CM | POA: Insufficient documentation

## 2014-06-19 DIAGNOSIS — M25572 Pain in left ankle and joints of left foot: Secondary | ICD-10-CM | POA: Insufficient documentation

## 2014-06-19 DIAGNOSIS — X58XXXA Exposure to other specified factors, initial encounter: Secondary | ICD-10-CM | POA: Insufficient documentation

## 2014-06-19 DIAGNOSIS — M069 Rheumatoid arthritis, unspecified: Secondary | ICD-10-CM | POA: Insufficient documentation

## 2014-06-19 DIAGNOSIS — Z79899 Other long term (current) drug therapy: Secondary | ICD-10-CM | POA: Insufficient documentation

## 2014-06-19 DIAGNOSIS — I10 Essential (primary) hypertension: Secondary | ICD-10-CM | POA: Insufficient documentation

## 2014-06-19 NOTE — ED Notes (Signed)
Pt c/o left knee pain.

## 2014-06-20 MED ORDER — DEXAMETHASONE 4 MG PO TABS: 4.0000 mg | ORAL_TABLET | Freq: Two times a day (BID) | ORAL | Status: DC

## 2014-06-20 MED ORDER — HYDROCODONE-ACETAMINOPHEN 5-325 MG PO TABS
1.0000 | ORAL_TABLET | Freq: Once | ORAL | Status: AC
  Administered 2014-06-20: 1 via ORAL
  Filled 2014-06-20: qty 1

## 2014-06-20 MED ORDER — DIPHENHYDRAMINE HCL 12.5 MG/5ML PO ELIX
25.0000 mg | ORAL_SOLUTION | Freq: Once | ORAL | Status: AC
  Administered 2014-06-20: 25 mg via ORAL
  Filled 2014-06-20: qty 10

## 2014-06-20 MED ORDER — DICLOFENAC SODIUM 75 MG PO TBEC: 75.0000 mg | DELAYED_RELEASE_TABLET | Freq: Two times a day (BID) | ORAL | Status: DC

## 2014-06-20 MED ORDER — HYDROCODONE-ACETAMINOPHEN 5-325 MG PO TABS: 1.0000 | ORAL_TABLET | ORAL | Status: DC | PRN

## 2014-06-20 NOTE — ED Provider Notes (Signed)
CSN: [id number]     Arrival date & time 06/19/14  2134 History   First MD Initiated Contact with Patient 06/19/14 2256     Chief Complaint  Patient presents with  . Knee Pain     (Consider location/radiation/quality/duration/timing/severity/associated sxs/prior Treatment) Patient is a 49 y.o. female presenting with knee pain. The history is provided by the patient.  Knee Pain Location:  Knee Injury: yes   Mechanism of injury comment:  Near fall with twisting injury. Knee location:  L knee Pain details:    Quality:  Aching   Radiates to: left foot.   Severity:  Moderate   Onset quality:  Gradual   Duration:  2 days   Timing:  Intermittent Chronicity:  Chronic Dislocation: no   Foreign body present:  No foreign bodies Prior injury to area:  No Relieved by:  Nothing Worsened by:  Bearing weight and flexion Ineffective treatments: plaquenil. Associated symptoms: back pain, stiffness and swelling   Associated symptoms: no fever and no numbness   Risk factors: no concern for non-accidental trauma   Risk factors comment:  Rheumatoid arthritis   Past Medical History  Diagnosis Date  . Rheumatoid arthritis(714.0)   . Chronic anemia   . Hypertension   . Arthritis, rheumatoid   . Incidental lung nodule   . Depression   . Chronic pain   . Chronic back pain   . COPD (chronic obstructive pulmonary disease)    Past Surgical History  Procedure Laterality Date  . Tubal ligation    . Endometrial ablation    . Breast cyst excision  12/31/2010    Procedure: CYST EXCISION BREAST;  Surgeon: Fabio Bering, MD;  Location: [facility];  Service: General;  Laterality: Right;  Excision Sebaceous Cyst Right Breast   Family History  Problem Relation Age of Onset  . Anesthesia problems Neg Hx   . Cancer Father    History  Substance Use Topics  . Smoking status: Current Every Day Smoker -- 0.50 packs/day for 30 years    Types: Cigarettes  . Smokeless tobacco: Never Used  . Alcohol Use:  No     Comment: occassional   OB History    Gravida Para Term Preterm AB TAB SAB Ectopic Multiple Living   6 5 5  1  1   5      Review of Systems  Constitutional: Negative for fever.  Musculoskeletal: Positive for back pain, joint swelling, arthralgias and stiffness.  All other systems reviewed and are negative.     Allergies  Codeine  Home Medications   Prior to Admission medications   Medication Sig Start Date End Date Taking? Authorizing Provider  albuterol (PROVENTIL HFA;VENTOLIN HFA) 108 (90 BASE) MCG/ACT inhaler Inhale 2 puffs into the lungs every 6 (six) hours as needed for wheezing or shortness of breath.    Historical Provider, MD  aspirin EC 325 MG tablet Take 325-650 mg by mouth 2 (two) times daily as needed for mild pain or moderate pain.    Historical Provider, MD  Aspirin-Salicylamide-Caffeine (BC HEADACHE) 325-95-16 MG TABS Take 2 packets by mouth daily as needed (pain).     Historical Provider, MD  Calcium Carbonate-Vitamin D (CALCIUM 600 + D PO) Take 1 tablet by mouth every morning.     Historical Provider, MD  ferrous sulfate 325 (65 FE) MG tablet Take 325 mg by mouth daily. For anemia    Historical Provider, MD  folic acid (FOLVITE) 1 MG tablet Take 1 mg by mouth  daily.      Historical Provider, MD  HYDROcodone-acetaminophen (NORCO/VICODIN) 5-325 MG per tablet Take one-two tabs po q 4-6 hrs prn pain Patient not taking: Reported on 05/23/2014 05/19/14   Tammy Triplett, PA-C  hydroxychloroquine (PLAQUENIL) 200 MG tablet Take 200 mg by mouth 3 (three) times daily.     Historical Provider, MD  methotrexate (RHEUMATREX) 2.5 MG tablet Take 12.5 mg by mouth 2 (two) times a week. Thursday and Friday Caution:Chemotherapy. Protect from light.    Historical Provider, MD  metroNIDAZOLE (FLAGYL) 500 MG tablet Take 1 tablet (500 mg total) by mouth 2 (two) times daily. Patient not taking: Reported on 04/04/2014 02/24/14   Janne Napoleon, NP  Multiple Vitamin (MULTIVITAMIN WITH  MINERALS) TABS Take 1 tablet by mouth daily.    Historical Provider, MD  naproxen (NAPROSYN) 500 MG tablet Take 1 tablet (500 mg total) by mouth 2 (two) times daily. 05/23/14   Glynn Octave, MD  oxyCODONE-acetaminophen (ROXICET) 5-325 MG per tablet Take 1 tablet by mouth every 6 (six) hours as needed for severe pain. Patient not taking: Reported on 04/04/2014 02/24/14   Janne Napoleon, NP  predniSONE (DELTASONE) 20 MG tablet Take 40 mg by mouth 2 (two) times daily as needed.     Historical Provider, MD  promethazine (PHENERGAN) 12.5 MG tablet Take 1 tablet (12.5 mg total) by mouth every 6 (six) hours as needed for nausea or vomiting. Patient not taking: Reported on 04/04/2014 02/24/14   Janne Napoleon, NP   BP 134/85 mmHg  Pulse 83  Temp(Src) 98.5 F (36.9 C) (Oral)  Resp 18  Ht 5\' 6"  (1.676 m)  Wt 150 lb (68.04 kg)  BMI 24.22 kg/m2  SpO2 100%  LMP 01/13/2011 Physical Exam  Constitutional: She is oriented to person, place, and time. She appears well-developed and well-nourished.  Non-toxic appearance.  HENT:  Head: Normocephalic.  Right Ear: Tympanic membrane and external ear normal.  Left Ear: Tympanic membrane and external ear normal.  Eyes: EOM and lids are normal. Pupils are equal, round, and reactive to light.  Neck: Normal range of motion. Neck supple. Carotid bruit is not present.  Cardiovascular: Normal rate, regular rhythm, normal heart sounds, intact distal pulses and normal pulses.   Pulmonary/Chest: Breath sounds normal. No respiratory distress.  Abdominal: Soft. Bowel sounds are normal. There is no tenderness. There is no guarding.  Musculoskeletal: Normal range of motion.       Left ankle: She exhibits no deformity and no laceration. Tenderness. Lateral malleolus tenderness found.       Left foot: There is tenderness. There is no swelling and normal capillary refill.  Effusion of the left knee. No hot joint. Crepitus noted. Neg Homan's sign DP pulse wnl.  Arthritic  deformity of the left and right feet.  Lymphadenopathy:       Head (right side): No submandibular adenopathy present.       Head (left side): No submandibular adenopathy present.    She has no cervical adenopathy.  Neurological: She is alert and oriented to person, place, and time. She has normal strength. No cranial nerve deficit or sensory deficit.  Skin: Skin is warm and dry.  Psychiatric: She has a normal mood and affect. Her speech is normal.  Nursing note and vitals reviewed.   ED Course  Procedures (including critical care time) Labs Review Labs Reviewed - No data to display  Imaging Review No results found.   EKG Interpretation None  MDM  The xray of the left knee reveals a small joint effusion.  No fx or dislocation. The xray of the left foot reveals arthritis changes, but no fb or dislocation. No gross neurovascular changes. Pt to follow up with her PCP and rheumatologist. Rx for decadron, diclofenac and norco given to the patient.   Final diagnoses:  None    *I have reviewed nursing notes, vital signs, and all appropriate lab and imaging results for this patient.Ivery Quale, PA-C 06/21/14 1138  Devoria Albe, MD 06/25/14 2308

## 2014-06-20 NOTE — Discharge Instructions (Signed)
You have an effusion (small amount of fluid in the joint) of the knee. Please use medications  As prescribed. Please call Dr Janna Arch for office appointment and management as soon as possible. Knee Effusion The medical term for having fluid in your knee is effusion. This is often due to an internal derangement of the knee. This means something is wrong inside the knee. Some of the causes of fluid in the knee may be torn cartilage, a torn ligament, or bleeding into the joint from an injury. Your knee is likely more difficult to bend and move. This is often because there is increased pain and pressure in the joint. The time it takes for recovery from a knee effusion depends on different factors, including:   Type of injury.  Your age.  Physical and medical conditions.  Rehabilitation Strategies. How long you will be away from your normal activities will depend on what kind of knee problem you have and how much damage is present. Your knee has two types of cartilage. Articular cartilage covers the bone ends and lets your knee bend and move smoothly. Two menisci, thick pads of cartilage that form a rim inside the joint, help absorb shock and stabilize your knee. Ligaments bind the bones together and support your knee joint. Muscles move the joint, help support your knee, and take stress off the joint itself. CAUSES  Often an effusion in the knee is caused by an injury to one of the menisci. This is often a tear in the cartilage. Recovery after a meniscus injury depends on how much meniscus is damaged and whether you have damaged other knee tissue. Small tears may heal on their own with conservative treatment. Conservative means rest, limited weight bearing activity and muscle strengthening exercises. Your recovery may take up to 6 weeks.  TREATMENT  Larger tears may require surgery. Meniscus injuries may be treated during arthroscopy. Arthroscopy is a procedure in which your surgeon uses a small telescope  like instrument to look in your knee. Your caregiver can make a more accurate diagnosis (learning what is wrong) by performing an arthroscopic procedure. If your injury is on the inner margin of the meniscus, your surgeon may trim the meniscus back to a smooth rim. In other cases your surgeon will try to repair a damaged meniscus with stitches (sutures). This may make rehabilitation take longer, but may provide better long term result by helping your knee keep its shock absorption capabilities. Ligaments which are completely torn usually require surgery for repair. HOME CARE INSTRUCTIONS  Use crutches as instructed.  If a brace is applied, use as directed.  Once you are home, an ice pack applied to your swollen knee may help with discomfort and help decrease swelling.  Keep your knee raised (elevated) when you are not up and around or on crutches.  Only take over-the-counter or prescription medicines for pain, discomfort, or fever as directed by your caregiver.  Your caregivers will help with instructions for rehabilitation of your knee. This often includes strengthening exercises.  You may resume a normal diet and activities as directed. SEEK MEDICAL CARE IF:   There is increased swelling in your knee.  You notice redness, swelling, or increasing pain in your knee.  An unexplained oral temperature above 102 F (38.9 C) develops. SEEK IMMEDIATE MEDICAL CARE IF:   You develop a rash.  You have difficulty breathing.  You have any allergic reactions from medications you may have been given.  There is severe pain with  any motion of the knee. MAKE SURE YOU:   Understand these instructions.  Will watch your condition.  Will get help right away if you are not doing well or get worse. Document Released: 03/21/2003 Document Revised: 03/23/2011 Document Reviewed: 05/25/2007 [facility] Patient Information 2015 [place], [place]. This information is not intended to replace advice given to  you by your health care provider. Make sure you discuss any questions you have with your health care provider.

## 2014-07-04 ENCOUNTER — Emergency Department (HOSPITAL_COMMUNITY)
Admission: EM | Admit: 2014-07-04 | Discharge: 2014-07-04 | Disposition: A | Discharge status: Home / Self Care | Payer: Self-pay | Attending: Emergency Medicine | Admitting: Emergency Medicine

## 2014-07-04 ENCOUNTER — Emergency Department (HOSPITAL_COMMUNITY): Payer: Self-pay

## 2014-07-04 ENCOUNTER — Encounter (HOSPITAL_COMMUNITY): Payer: Self-pay | Admitting: Emergency Medicine

## 2014-07-04 DIAGNOSIS — F329 Major depressive disorder, single episode, unspecified: Secondary | ICD-10-CM | POA: Insufficient documentation

## 2014-07-04 DIAGNOSIS — Z7982 Long term (current) use of aspirin: Secondary | ICD-10-CM | POA: Insufficient documentation

## 2014-07-04 DIAGNOSIS — Z791 Long term (current) use of non-steroidal anti-inflammatories (NSAID): Secondary | ICD-10-CM | POA: Insufficient documentation

## 2014-07-04 DIAGNOSIS — J449 Chronic obstructive pulmonary disease, unspecified: Secondary | ICD-10-CM | POA: Insufficient documentation

## 2014-07-04 DIAGNOSIS — I10 Essential (primary) hypertension: Secondary | ICD-10-CM | POA: Insufficient documentation

## 2014-07-04 DIAGNOSIS — K625 Hemorrhage of anus and rectum: Secondary | ICD-10-CM | POA: Insufficient documentation

## 2014-07-04 DIAGNOSIS — Z79899 Other long term (current) drug therapy: Secondary | ICD-10-CM | POA: Insufficient documentation

## 2014-07-04 DIAGNOSIS — R1030 Lower abdominal pain, unspecified: Secondary | ICD-10-CM

## 2014-07-04 DIAGNOSIS — D649 Anemia, unspecified: Secondary | ICD-10-CM | POA: Insufficient documentation

## 2014-07-04 DIAGNOSIS — M069 Rheumatoid arthritis, unspecified: Secondary | ICD-10-CM | POA: Insufficient documentation

## 2014-07-04 DIAGNOSIS — Z720 Tobacco use: Secondary | ICD-10-CM | POA: Insufficient documentation

## 2014-07-04 DIAGNOSIS — Z3202 Encounter for pregnancy test, result negative: Secondary | ICD-10-CM | POA: Insufficient documentation

## 2014-07-04 DIAGNOSIS — Z9851 Tubal ligation status: Secondary | ICD-10-CM | POA: Insufficient documentation

## 2014-07-04 DIAGNOSIS — G8929 Other chronic pain: Secondary | ICD-10-CM | POA: Insufficient documentation

## 2014-07-04 LAB — URINALYSIS, ROUTINE W REFLEX MICROSCOPIC
BILIRUBIN URINE: NEGATIVE
GLUCOSE, UA: NEGATIVE mg/dL
HGB URINE DIPSTICK: NEGATIVE
KETONES UR: NEGATIVE mg/dL
LEUKOCYTES UA: NEGATIVE
Nitrite: NEGATIVE
PROTEIN: NEGATIVE mg/dL
Specific Gravity, Urine: <1.005 — ABNORMAL LOW (ref 1.005–1.030)
Urobilinogen, UA: 0.2 mg/dL (ref 0.0–1.0)
pH: 5.5 (ref 5.0–8.0)

## 2014-07-04 LAB — COMPREHENSIVE METABOLIC PANEL
ALT: 22 U/L (ref 14–54)
AST: 14 U/L — ABNORMAL LOW (ref 15–41)
Albumin: 3.6 g/dL (ref 3.5–5.0)
Alkaline Phosphatase: 79 U/L (ref 38–126)
Anion gap: 9 (ref 5–15)
BUN: 16 mg/dL (ref 6–20)
CO2: 29 mmol/L (ref 22–32)
Calcium: 8.5 mg/dL — ABNORMAL LOW (ref 8.9–10.3)
Chloride: 102 mmol/L (ref 101–111)
Creatinine, Ser: 0.66 mg/dL (ref 0.44–1.00)
GFR calc Af Amer: >60 mL/min (ref >60)
GFR calc non Af Amer: >60 mL/min (ref >60)
Glucose, Bld: 117 mg/dL — ABNORMAL HIGH (ref 65–99)
Potassium: 3.4 mmol/L — ABNORMAL LOW (ref 3.5–5.1)
Sodium: 140 mmol/L (ref 135–145)
Total Bilirubin: 0.4 mg/dL (ref 0.3–1.2)
Total Protein: 6.9 g/dL (ref 6.5–8.1)

## 2014-07-04 LAB — CBC WITH DIFFERENTIAL/PLATELET
Basophils Absolute: 0 10*3/uL (ref 0.0–0.1)
Basophils Relative: 0 % (ref 0–1)
Eosinophils Absolute: 0.3 10*3/uL (ref 0.0–0.7)
Eosinophils Relative: 2 % (ref 0–5)
HCT: 30 % — ABNORMAL LOW (ref 36.0–46.0)
Hemoglobin: 8.8 g/dL — ABNORMAL LOW (ref 12.0–15.0)
Lymphocytes Relative: 9 % — ABNORMAL LOW (ref 12–46)
Lymphs Abs: 1.4 10*3/uL (ref 0.7–4.0)
MCH: 22.2 pg — ABNORMAL LOW (ref 26.0–34.0)
MCHC: 29.3 g/dL — ABNORMAL LOW (ref 30.0–36.0)
MCV: 75.8 fL — ABNORMAL LOW (ref 78.0–100.0)
Monocytes Absolute: 0.5 10*3/uL (ref 0.1–1.0)
Monocytes Relative: 4 % (ref 3–12)
Neutro Abs: 12.8 10*3/uL — ABNORMAL HIGH (ref 1.7–7.7)
Neutrophils Relative %: 85 % — ABNORMAL HIGH (ref 43–77)
Platelets: 573 10*3/uL — ABNORMAL HIGH (ref 150–400)
RBC: 3.96 MIL/uL (ref 3.87–5.11)
RDW: 18.9 % — ABNORMAL HIGH (ref 11.5–15.5)
WBC: 15 10*3/uL — ABNORMAL HIGH (ref 4.0–10.5)

## 2014-07-04 LAB — PREGNANCY, URINE: PREG TEST UR: NEGATIVE

## 2014-07-04 MED ORDER — HYDROMORPHONE HCL 1 MG/ML IJ SOLN
1.0000 mg | Freq: Once | INTRAMUSCULAR | Status: AC
  Administered 2014-07-04: 1 mg via INTRAVENOUS
  Filled 2014-07-04: qty 1

## 2014-07-04 MED ORDER — KETOROLAC TROMETHAMINE 30 MG/ML IJ SOLN
15.0000 mg | Freq: Once | INTRAMUSCULAR | Status: AC
  Administered 2014-07-04: 15 mg via INTRAVENOUS
  Filled 2014-07-04: qty 1

## 2014-07-04 MED ORDER — ONDANSETRON HCL 4 MG/2ML IJ SOLN
4.0000 mg | Freq: Once | INTRAMUSCULAR | Status: AC
  Administered 2014-07-04: 4 mg via INTRAVENOUS
  Filled 2014-07-04: qty 2

## 2014-07-04 MED ORDER — IOHEXOL 300 MG/ML  SOLN
50.0000 mL | Freq: Once | INTRAMUSCULAR | Status: AC | PRN
  Administered 2014-07-04: 50 mL via ORAL

## 2014-07-04 MED ORDER — IOHEXOL 300 MG/ML  SOLN
100.0000 mL | Freq: Once | INTRAMUSCULAR | Status: AC | PRN
  Administered 2014-07-04: 100 mL via INTRAVENOUS

## 2014-07-04 MED ORDER — SODIUM CHLORIDE 0.9 % IV BOLUS (SEPSIS)
1000.0000 mL | Freq: Once | INTRAVENOUS | Status: AC
  Administered 2014-07-04: 1000 mL via INTRAVENOUS

## 2014-07-04 MED ORDER — OXYCODONE-ACETAMINOPHEN 5-325 MG PO TABS: 1.0000 | ORAL_TABLET | ORAL | Status: DC | PRN

## 2014-07-04 NOTE — ED Provider Notes (Signed)
CSN: [id number]     Arrival date & time 07/04/14  1303 History   First MD Initiated Contact with Patient 07/04/14 1317     Chief Complaint  Patient presents with  . Abdominal Pain  . Rectal Bleeding     (Consider location/radiation/quality/duration/timing/severity/associated sxs/prior Treatment) HPI   49 year old female with abdominal pain and rectal bleeding. Symptom onset this morning. Pain is crampy. Lower abdomen and left lower quadrant. Waxes and wanes but doesn't completely go away. No appreciable exacerbating relieving factors. Bloating and abdominal distention. Bright red blood mixed in with her stool. No history similar symptoms. No blood thinners. Never had a colonoscopy.  Past Medical History  Diagnosis Date  . Rheumatoid arthritis(714.0)   . Chronic anemia   . Hypertension   . Arthritis, rheumatoid   . Incidental lung nodule   . Depression   . Chronic pain   . Chronic back pain   . COPD (chronic obstructive pulmonary disease)    Past Surgical History  Procedure Laterality Date  . Tubal ligation    . Endometrial ablation    . Breast cyst excision  12/31/2010    Procedure: CYST EXCISION BREAST;  Surgeon: Fabio Bering, MD;  Location: [facility];  Service: General;  Laterality: Right;  Excision Sebaceous Cyst Right Breast   Family History  Problem Relation Age of Onset  . Anesthesia problems Neg Hx   . Cancer Father    History  Substance Use Topics  . Smoking status: Current Every Day Smoker -- 0.50 packs/day for 30 years    Types: Cigarettes  . Smokeless tobacco: Never Used  . Alcohol Use: No     Comment: occassional   OB History    Gravida Para Term Preterm AB TAB SAB Ectopic Multiple Living   6 5 5  1  1   5      Review of Systems  All systems reviewed and negative, other than as noted in HPI.   Allergies  Codeine  Home Medications   Prior to Admission medications   Medication Sig Start Date End Date Taking? Authorizing Provider  albuterol  (PROVENTIL HFA;VENTOLIN HFA) 108 (90 BASE) MCG/ACT inhaler Inhale 2 puffs into the lungs every 6 (six) hours as needed for wheezing or shortness of breath.    Historical Provider, MD  aspirin EC 325 MG tablet Take 325-650 mg by mouth 2 (two) times daily as needed for mild pain or moderate pain.    Historical Provider, MD  Aspirin-Salicylamide-Caffeine (BC HEADACHE) 325-95-16 MG TABS Take 2 packets by mouth daily as needed (pain).     Historical Provider, MD  Calcium Carbonate-Vitamin D (CALCIUM 600 + D PO) Take 1 tablet by mouth every morning.     Historical Provider, MD  dexamethasone (DECADRON) 4 MG tablet Take 1 tablet (4 mg total) by mouth 2 (two) times daily with a meal. 06/20/14   08/20/14, PA-C  diclofenac (VOLTAREN) 75 MG EC tablet Take 1 tablet (75 mg total) by mouth 2 (two) times daily. 06/20/14   08/20/14, PA-C  ferrous sulfate 325 (65 FE) MG tablet Take 325 mg by mouth daily. For anemia    Historical Provider, MD  folic acid (FOLVITE) 1 MG tablet Take 1 mg by mouth daily.      Historical Provider, MD  HYDROcodone-acetaminophen (NORCO/VICODIN) 5-325 MG per tablet Take 1 tablet by mouth every 4 (four) hours as needed. 06/20/14   08/20/14, PA-C  hydroxychloroquine (PLAQUENIL) 200 MG tablet Take 200 mg by mouth  3 (three) times daily.     Historical Provider, MD  methotrexate (RHEUMATREX) 2.5 MG tablet Take 12.5 mg by mouth 2 (two) times a week. Thursday and Friday Caution:Chemotherapy. Protect from light.    Historical Provider, MD  metroNIDAZOLE (FLAGYL) 500 MG tablet Take 1 tablet (500 mg total) by mouth 2 (two) times daily. Patient not taking: Reported on 04/04/2014 02/24/14   Janne Napoleon, NP  Multiple Vitamin (MULTIVITAMIN WITH MINERALS) TABS Take 1 tablet by mouth daily.    Historical Provider, MD  oxyCODONE-acetaminophen (ROXICET) 5-325 MG per tablet Take 1 tablet by mouth every 6 (six) hours as needed for severe pain. Patient not taking: Reported on 04/04/2014 02/24/14   Janne Napoleon, NP  predniSONE (DELTASONE) 20 MG tablet Take 40 mg by mouth 2 (two) times daily as needed.     Historical Provider, MD  promethazine (PHENERGAN) 12.5 MG tablet Take 1 tablet (12.5 mg total) by mouth every 6 (six) hours as needed for nausea or vomiting. Patient not taking: Reported on 04/04/2014 02/24/14   Janne Napoleon, NP   BP 151/78 mmHg  Pulse 86  Temp(Src) 99.3 F (37.4 C) (Oral)  Resp 18  Ht 5\' 6"  (1.676 m)  Wt 150 lb (68.04 kg)  BMI 24.22 kg/m2  SpO2 100%  LMP 01/13/2011 Physical Exam  Constitutional: She appears well-developed and well-nourished. No distress.  HENT:  Head: Normocephalic and atraumatic.  Eyes: Conjunctivae are normal. Right eye exhibits no discharge. Left eye exhibits no discharge.  Neck: Neck supple.  Cardiovascular: Normal rate, regular rhythm and normal heart sounds.  Exam reveals no gallop and no friction rub.   No murmur heard. Pulmonary/Chest: Effort normal and breath sounds normal. No respiratory distress.  Abdominal: Soft. She exhibits distension. There is no tenderness.  Abdomen is distended, but soft. Tenderness in left lower quadrant. No rebound or guarding.  Genitourinary:  Pt refusing DRE  Musculoskeletal: She exhibits no edema or tenderness.  Neurological: She is alert.  Skin: Skin is warm and dry.  Psychiatric: She has a normal mood and affect. Her behavior is normal. Thought content normal.  Nursing note and vitals reviewed.   ED Course  Procedures (including critical care time) Labs Review Labs Reviewed  CBC WITH DIFFERENTIAL/PLATELET - Abnormal; Notable for the following:    WBC 15.0 (*)    Hemoglobin 8.8 (*)    HCT 30.0 (*)    MCV 75.8 (*)    MCH 22.2 (*)    MCHC 29.3 (*)    RDW 18.9 (*)    Platelets 573 (*)    Neutrophils Relative % 85 (*)    Neutro Abs 12.8 (*)    Lymphocytes Relative 9 (*)    All other components within normal limits  COMPREHENSIVE METABOLIC PANEL - Abnormal; Notable for the following:    Potassium  3.4 (*)    Glucose, Bld 117 (*)    Calcium 8.5 (*)    AST 14 (*)    All other components within normal limits  URINALYSIS, ROUTINE W REFLEX MICROSCOPIC (NOT AT [facility]) - Abnormal; Notable for the following:    Specific Gravity, Urine <1.005 (*)    All other components within normal limits  PREGNANCY, URINE    Imaging Review No results found.   Ct Abdomen Pelvis W Contrast  07/04/2014   CLINICAL DATA:  Initial encounter for bloody stools beginning this morning.  EXAM: CT ABDOMEN AND PELVIS WITH CONTRAST  TECHNIQUE: Multidetector CT imaging of the abdomen and  pelvis was performed using the standard protocol following bolus administration of intravenous contrast.  CONTRAST:  29mL OMNIPAQUE IOHEXOL 300 MG/ML SOLN, OMNIPAQUE IOHEXOL 300 MG/ML SOLN  COMPARISON:  11/20/2013.  FINDINGS: Lower chest:  Unremarkable.  Hepatobiliary: No focal abnormality within the liver parenchyma. There is no evidence for gallstones, gallbladder wall thickening, or pericholecystic fluid. No intrahepatic or extrahepatic biliary dilation.  Pancreas: No focal mass lesion. No dilatation of the main duct. No intraparenchymal cyst. No peripancreatic edema.  Spleen: No splenomegaly. No focal mass lesion.  Adrenals/Urinary Tract: No adrenal nodule or mass. Kidneys are unremarkable without enhancing lesion or hydronephrosis. No evidence for hydroureter. Urinary bladder is normal in appearance.  Stomach/Bowel: Stomach is nondistended. No gastric wall thickening. No evidence of outlet obstruction. Duodenum is normally positioned as is the ligament of Treitz. No small bowel wall thickening. No small bowel dilatation. Terminal ileum normal. Appendix normal. No gross colonic mass. No colonic wall thickening. No substantial diverticular change.  Vascular/Lymphatic: There is abdominal aortic atherosclerosis without aneurysm. No evidence for lymphadenopathy in the abdomen. No pelvic sidewall lymphadenopathy.  Reproductive: Uterus is normal.   No adnexal mass.  Other: No intraperitoneal free fluid.  Musculoskeletal: Bone windows reveal no worrisome lytic or sclerotic osseous lesions.  IMPRESSION: No acute findings in the abdomen or pelvis. Specifically, no findings to explain the patient's history of bloody stools with abdominal bloating.  Imaging findings of potential clinical significance:  Abdominal aortic atherosclerosis.   Electronically Signed   By: Kennith Center M.D.   On: 07/04/2014 15:20    EKG Interpretation None      MDM   Final diagnoses:  Lower abdominal pain  Rectal bleeding    49yF with abdominal pain and rectal bleeding. Imaging unremarkable. HD stable. Doubt significant GI bleed. Anemic, but stable. It has been determined that no acute conditions requiring further emergency intervention are present at this time. The patient has been advised of the diagnosis and plan. I reviewed any labs and imaging including any potential incidental findings. We have discussed signs and symptoms that warrant return to the ED and they are listed in the discharge instructions.      Raeford Razor, MD 07/07/14 [phone]

## 2014-07-04 NOTE — Discharge Instructions (Signed)
Abdominal Pain °Many things can cause abdominal pain. Usually, abdominal pain is not caused by a disease and will improve without treatment. It can often be observed and treated at home. Your health care provider will do a physical exam and possibly order blood tests and X-rays to help determine the seriousness of your pain. However, in many cases, more time must pass before a clear cause of the pain can be found. Before that point, your health care provider may not know if you need more testing or further treatment. °HOME CARE INSTRUCTIONS  °Monitor your abdominal pain for any changes. The following actions may help to alleviate any discomfort you are experiencing: °· Only take over-the-counter or prescription medicines as directed by your health care provider. °· Do not take laxatives unless directed to do so by your health care provider. °· Try a clear liquid diet (broth, tea, or water) as directed by your health care provider. Slowly move to a bland diet as tolerated. °SEEK MEDICAL CARE IF: °· You have unexplained abdominal pain. °· You have abdominal pain associated with nausea or diarrhea. °· You have pain when you urinate or have a bowel movement. °· You experience abdominal pain that wakes you in the night. °· You have abdominal pain that is worsened or improved by eating food. °· You have abdominal pain that is worsened with eating fatty foods. °· You have a fever. °SEEK IMMEDIATE MEDICAL CARE IF:  °· Your pain does not go away within 2 hours. °· You keep throwing up (vomiting). °· Your pain is felt only in portions of the abdomen, such as the right side or the left lower portion of the abdomen. °· You pass bloody or black tarry stools. °MAKE SURE YOU: °· Understand these instructions.   °· Will watch your condition.   °· Will get help right away if you are not doing well or get worse.   °Document Released: 10/08/2004 Document Revised: 01/03/2013 Document Reviewed: 09/07/2012 °[employer]® Patient Information  ©2015 [employer]. This information is not intended to replace advice given to you by your health care provider. Make sure you discuss any questions you have with your health care provider. °Rectal Bleeding °Rectal bleeding is when blood passes out of the anus. It is usually a sign that something is wrong. It may not be serious, but it should always be evaluated. Rectal bleeding may present as bright red blood or extremely dark stools. The color may range from dark red or maroon to black (like tar). It is important that the cause of rectal bleeding be identified so treatment can be started and the problem corrected. °CAUSES  °· Hemorrhoids. These are enlarged (dilated) blood vessels or veins in the anal or rectal area. °· Fistulas. These are abnormal, burrowing channels that usually run from inside the rectum to the skin around the anus. They can bleed. °· Anal fissures. This is a tear in the tissue of the anus. Bleeding occurs with bowel movements. °· Diverticulosis. This is a condition in which pockets or sacs project from the bowel wall. Occasionally, the sacs can bleed. °· Diverticulitis. This is an infection involving diverticulosis of the colon. °· Proctitis and colitis. These are conditions in which the rectum, colon, or both, can become inflamed and pitted (ulcerated). °· Polyps and cancer. Polyps are non-cancerous (benign) growths in the colon that may bleed. Certain types of polyps turn into cancer. °· Protrusion of the rectum. Part of the rectum can project from the anus and bleed. °· Certain medicines. °· Intestinal infections. °·   Blood vessel abnormalities. °HOME CARE INSTRUCTIONS °· Eat a high-fiber diet to keep your stool soft. °· Limit activity. °· Drink enough fluids to keep your urine clear or pale yellow. °· Warm baths may be useful to soothe rectal pain. °· Follow up with your caregiver as directed. °SEEK IMMEDIATE MEDICAL CARE IF: °· You develop increased bleeding. °· You have black or dark  red stools. °· You vomit blood or material that looks like coffee grounds. °· You have abdominal pain or tenderness. °· You have a fever. °· You feel weak, nauseous, or you faint. °· You have severe rectal pain or you are unable to have a bowel movement. °MAKE SURE YOU: °· Understand these instructions. °· Will watch your condition. °· Will get help right away if you are not doing well or get worse. °Document Released: 06/20/2001 Document Revised: 03/23/2011 Document Reviewed: 06/15/2010 °[employer]® Patient Information ©2015 [employer]. This information is not intended to replace advice given to you by your health care provider. Make sure you discuss any questions you have with your health care provider. ° °

## 2014-07-04 NOTE — ED Notes (Signed)
Pt states that she started having blood in stool this morning.  Is currently taking Voltaren.  States that she also has a lot of gas and stomach is swollen more than usual.
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
## 2014-07-24 ENCOUNTER — Emergency Department (HOSPITAL_COMMUNITY): Payer: Self-pay

## 2014-07-24 ENCOUNTER — Encounter (HOSPITAL_COMMUNITY): Payer: Self-pay | Admitting: Emergency Medicine

## 2014-07-24 ENCOUNTER — Emergency Department (HOSPITAL_COMMUNITY)
Admission: EM | Admit: 2014-07-24 | Discharge: 2014-07-24 | Disposition: A | Discharge status: Home / Self Care | Payer: Self-pay | Attending: Emergency Medicine | Admitting: Emergency Medicine

## 2014-07-24 DIAGNOSIS — S300XXA Contusion of lower back and pelvis, initial encounter: Secondary | ICD-10-CM | POA: Insufficient documentation

## 2014-07-24 DIAGNOSIS — Z720 Tobacco use: Secondary | ICD-10-CM | POA: Insufficient documentation

## 2014-07-24 DIAGNOSIS — S5002XA Contusion of left elbow, initial encounter: Secondary | ICD-10-CM | POA: Insufficient documentation

## 2014-07-24 DIAGNOSIS — M069 Rheumatoid arthritis, unspecified: Secondary | ICD-10-CM | POA: Insufficient documentation

## 2014-07-24 DIAGNOSIS — G8929 Other chronic pain: Secondary | ICD-10-CM | POA: Insufficient documentation

## 2014-07-24 DIAGNOSIS — Y998 Other external cause status: Secondary | ICD-10-CM | POA: Insufficient documentation

## 2014-07-24 DIAGNOSIS — I10 Essential (primary) hypertension: Secondary | ICD-10-CM | POA: Insufficient documentation

## 2014-07-24 DIAGNOSIS — Z3202 Encounter for pregnancy test, result negative: Secondary | ICD-10-CM | POA: Insufficient documentation

## 2014-07-24 DIAGNOSIS — Y9289 Other specified places as the place of occurrence of the external cause: Secondary | ICD-10-CM | POA: Insufficient documentation

## 2014-07-24 DIAGNOSIS — Z8659 Personal history of other mental and behavioral disorders: Secondary | ICD-10-CM | POA: Insufficient documentation

## 2014-07-24 DIAGNOSIS — Z7982 Long term (current) use of aspirin: Secondary | ICD-10-CM | POA: Insufficient documentation

## 2014-07-24 DIAGNOSIS — Z79899 Other long term (current) drug therapy: Secondary | ICD-10-CM | POA: Insufficient documentation

## 2014-07-24 DIAGNOSIS — W19XXXA Unspecified fall, initial encounter: Secondary | ICD-10-CM

## 2014-07-24 DIAGNOSIS — W108XXA Fall (on) (from) other stairs and steps, initial encounter: Secondary | ICD-10-CM | POA: Insufficient documentation

## 2014-07-24 DIAGNOSIS — D649 Anemia, unspecified: Secondary | ICD-10-CM | POA: Insufficient documentation

## 2014-07-24 DIAGNOSIS — Y9389 Activity, other specified: Secondary | ICD-10-CM | POA: Insufficient documentation

## 2014-07-24 DIAGNOSIS — Z7952 Long term (current) use of systemic steroids: Secondary | ICD-10-CM | POA: Insufficient documentation

## 2014-07-24 DIAGNOSIS — J449 Chronic obstructive pulmonary disease, unspecified: Secondary | ICD-10-CM | POA: Insufficient documentation

## 2014-07-24 LAB — PREGNANCY, URINE: Preg Test, Ur: NEGATIVE

## 2014-07-24 MED ORDER — HYDROCODONE-ACETAMINOPHEN 5-325 MG PO TABS: ORAL_TABLET | ORAL | Status: DC

## 2014-07-24 MED ORDER — HYDROCODONE-ACETAMINOPHEN 5-325 MG PO TABS
1.0000 | ORAL_TABLET | Freq: Once | ORAL | Status: AC
  Administered 2014-07-24: 1 via ORAL
  Filled 2014-07-24: qty 1

## 2014-07-24 NOTE — ED Provider Notes (Signed)
CSN: [id number]     Arrival date & time 07/24/14  1355 History   First MD Initiated Contact with Patient 07/24/14 1611     Chief Complaint  Patient presents with  . Fall     (Consider location/radiation/quality/duration/timing/severity/associated sxs/prior Treatment) HPI  [name] is a 49 y.o. female who presents to the Emergency Department complaining of pain to her left elbow and tailbone after a mechanical fall that occurred this morning.  She reports tripping on her shoes and landing her her buttocks.  Pain is worse with walking and sitting.  She has taken an ASA without relief.  She reports mild swelling to the elbow.  She denies head injury, neck pain, numbness or weakness of the upper or lower extremities, urine or bowel changes and abdominal pain  Past Medical History  Diagnosis Date  . Rheumatoid arthritis(714.0)   . Chronic anemia   . Hypertension   . Arthritis, rheumatoid   . Incidental lung nodule   . Depression   . Chronic pain   . Chronic back pain   . COPD (chronic obstructive pulmonary disease)    Past Surgical History  Procedure Laterality Date  . Tubal ligation    . Endometrial ablation    . Breast cyst excision  12/31/2010    Procedure: CYST EXCISION BREAST;  Surgeon: Fabio Bering, MD;  Location: [facility];  Service: General;  Laterality: Right;  Excision Sebaceous Cyst Right Breast   Family History  Problem Relation Age of Onset  . Anesthesia problems Neg Hx   . Cancer Father    History  Substance Use Topics  . Smoking status: Current Every Day Smoker -- 0.50 packs/day for 30 years    Types: Cigarettes  . Smokeless tobacco: Never Used  . Alcohol Use: No     Comment: occassional   OB History    Gravida Para Term Preterm AB TAB SAB Ectopic Multiple Living   6 5 5  1  1   5      Review of Systems  Constitutional: Negative for fever and chills.  Respiratory: Negative for shortness of breath.   Genitourinary: Negative for dysuria and difficulty  urinating.  Musculoskeletal: Positive for back pain, joint swelling and arthralgias. Negative for neck pain.  Skin: Negative for color change and wound.  Neurological: Negative for dizziness, weakness and numbness.  All other systems reviewed and are negative.     Allergies  Codeine  Home Medications   Prior to Admission medications   Medication Sig Start Date End Date Taking? Authorizing Provider  albuterol (PROVENTIL HFA;VENTOLIN HFA) 108 (90 BASE) MCG/ACT inhaler Inhale 2 puffs into the lungs every 6 (six) hours as needed for wheezing or shortness of breath.    Historical Provider, MD  aspirin EC 325 MG tablet Take 325-650 mg by mouth 2 (two) times daily as needed for mild pain or moderate pain.    Historical Provider, MD  Aspirin-Salicylamide-Caffeine (BC HEADACHE) 325-95-16 MG TABS Take 2 packets by mouth daily as needed (pain).     Historical Provider, MD  Calcium Carbonate-Vitamin D (CALCIUM 600 + D PO) Take 1 tablet by mouth every morning.     Historical Provider, MD  dexamethasone (DECADRON) 4 MG tablet Take 1 tablet (4 mg total) by mouth 2 (two) times daily with a meal. Patient not taking: Reported on 07/04/2014 06/20/14   Ivery Quale, PA-C  diclofenac (VOLTAREN) 75 MG EC tablet Take 1 tablet (75 mg total) by mouth 2 (two) times daily.  Patient taking differently: Take 75 mg by mouth 2 (two) times daily as needed for moderate pain.  06/20/14   Ivery Quale, PA-C  ferrous sulfate 325 (65 FE) MG tablet Take 325 mg by mouth daily. For anemia    Historical Provider, MD  folic acid (FOLVITE) 1 MG tablet Take 1 mg by mouth daily.      Historical Provider, MD  HYDROcodone-acetaminophen (NORCO/VICODIN) 5-325 MG per tablet Take 1 tablet by mouth every 4 (four) hours as needed. Patient not taking: Reported on 07/04/2014 06/20/14   Ivery Quale, PA-C  hydroxychloroquine (PLAQUENIL) 200 MG tablet Take 200 mg by mouth 3 (three) times daily.     Historical Provider, MD  methotrexate (RHEUMATREX)  2.5 MG tablet Take 12.5 mg by mouth 2 (two) times a week. Monday and Tuesday. Caution:Chemotherapy. Protect from light.    Historical Provider, MD  metroNIDAZOLE (FLAGYL) 500 MG tablet Take 1 tablet (500 mg total) by mouth 2 (two) times daily. Patient not taking: Reported on 04/04/2014 02/24/14   Janne Napoleon, NP  Multiple Vitamin (MULTIVITAMIN WITH MINERALS) TABS Take 1 tablet by mouth daily.    Historical Provider, MD  oxyCODONE-acetaminophen (PERCOCET/ROXICET) 5-325 MG per tablet Take 1-2 tablets by mouth every 4 (four) hours as needed. 07/04/14   Raeford Razor, MD  oxyCODONE-acetaminophen (ROXICET) 5-325 MG per tablet Take 1 tablet by mouth every 6 (six) hours as needed for severe pain. Patient not taking: Reported on 04/04/2014 02/24/14   Janne Napoleon, NP  predniSONE (DELTASONE) 10 MG tablet Take 10-40 mg by mouth daily with breakfast.    Historical Provider, MD  promethazine (PHENERGAN) 12.5 MG tablet Take 1 tablet (12.5 mg total) by mouth every 6 (six) hours as needed for nausea or vomiting. Patient not taking: Reported on 04/04/2014 02/24/14   Janne Napoleon, NP   BP 133/79 mmHg  Pulse 84  Temp(Src) 98.6 F (37 C) (Oral)  Resp 19  Ht 5\' 6"  (1.676 m)  Wt 160 lb (72.576 kg)  BMI 25.84 kg/m2  SpO2 100%  LMP 04/13/2011 Physical Exam  Constitutional: She is oriented to person, place, and time. She appears well-developed and well-nourished. No distress.  HENT:  Head: Normocephalic and atraumatic.  Neck: Normal range of motion. Neck supple.  Cardiovascular: Normal rate, regular rhythm, normal heart sounds and intact distal pulses.   No murmur heard. Pulmonary/Chest: Effort normal and breath sounds normal. No respiratory distress.  Abdominal: Soft. She exhibits no distension. There is no tenderness.  Musculoskeletal: She exhibits tenderness. She exhibits no edema.       Left elbow: She exhibits swelling. She exhibits normal range of motion. Tenderness found. Lateral epicondyle tenderness noted.        Lumbar back: She exhibits tenderness and pain. She exhibits normal range of motion, no swelling, no deformity, no laceration and normal pulse.  Localized ttp of the coccyx.  DP pulses are brisk and symmetrical.  Distal sensation intact.  Hip Flexors/Extensors are intact.  Pt has 5/5 strength against resistance of bilateral lower extremities.  No edema or abrasions.   Neurological: She is alert and oriented to person, place, and time. She has normal strength. No sensory deficit. She exhibits normal muscle tone. Coordination and gait normal.  Reflex Scores:      Patellar reflexes are 2+ on the right side and 2+ on the left side.      Achilles reflexes are 2+ on the right side and 2+ on the left side. Skin: Skin is warm  and dry. No rash noted.  Nursing note and vitals reviewed.   ED Course  Procedures (including critical care time) Labs Review Labs Reviewed  PREGNANCY, URINE    Imaging Review Dg Sacrum/coccyx  07/24/2014   CLINICAL DATA:  Fall down stairs today with sacral pain. Initial encounter.  EXAM: SACRUM AND COCCYX - 2+ VIEW  COMPARISON:  None.  FINDINGS: The sacrum and coccyx appear normal and show no evidence of fracture or dislocation. Both sacroiliac joints are symmetric and normal in appearance bilaterally. The visualized bony pelvis and visualize lower lumbar vertebral bodies appear normal. No bony lesions or significant arthropathy identified.  IMPRESSION: Normal sacrum and coccyx.   Electronically Signed   By: Irish Lack M.D.   On: 07/24/2014 17:41   Dg Elbow Complete Left  07/24/2014   CLINICAL DATA:  Acute left elbow pain after fall down steps today. Initial encounter.  EXAM: LEFT ELBOW - COMPLETE 3+ VIEW  COMPARISON:  October 20, 2010.  FINDINGS: There is no evidence of fracture, dislocation, or joint effusion. There is no evidence of arthropathy or other focal bone abnormality. Soft tissues are unremarkable.  IMPRESSION: Normal left elbow.   Electronically Signed    By: Lupita Raider, M.D.   On: 07/24/2014 17:43       EKG Interpretation None      MDM   Final diagnoses:  Contusion of coccyx, initial encounter  Contusion, elbow, left, initial encounter     XR's neg for fx.  Pt ambulates in the dept with a steady gait.  No focal neuro deficits on exam.  No concerning sx's for emergent neurological or infectious process.    Agrees to f/u with PMD and given ortho referral if needed.  Appears stable for d/c  Pauline Aus, PA-C 07/26/14 2145  Donnetta Hutching, MD 07/27/14 1351

## 2014-07-24 NOTE — Discharge Instructions (Signed)
Contusion °A contusion is a deep bruise. Contusions happen when an injury causes bleeding under the skin. Signs of bruising include pain, puffiness (swelling), and discolored skin. The contusion may turn blue, purple, or yellow. °HOME CARE  °· Put ice on the injured area. °¨ Put ice in a plastic bag. °¨ Place a towel between your skin and the bag. °¨ Leave the ice on for 15-20 minutes, 03-04 times a day. °· Only take medicine as told by your doctor. °· Rest the injured area. °· If possible, raise (elevate) the injured area to lessen puffiness. °GET HELP RIGHT AWAY IF:  °· You have more bruising or puffiness. °· You have pain that is getting worse. °· Your puffiness or pain is not helped by medicine. °MAKE SURE YOU:  °· Understand these instructions. °· Will watch your condition. °· Will get help right away if you are not doing well or get worse. °Document Released: 06/17/2007 Document Revised: 03/23/2011 Document Reviewed: 11/03/2010 °[employer]® Patient Information ©2015 [employer]. This information is not intended to replace advice given to you by your health care provider. Make sure you discuss any questions you have with your health care provider. ° °

## 2014-07-24 NOTE — ED Notes (Signed)
Larey Seat off 4 concert stairs on front porch.  Injury to left elbow and tailbone.  Rates pain 7/10.  Took a BC at 1330 without relief.

## 2014-08-10 ENCOUNTER — Emergency Department (HOSPITAL_COMMUNITY)
Admission: EM | Admit: 2014-08-10 | Discharge: 2014-08-10 | Disposition: A | Discharge status: Home / Self Care | Payer: Self-pay | Attending: Emergency Medicine | Admitting: Emergency Medicine

## 2014-08-10 ENCOUNTER — Encounter (HOSPITAL_COMMUNITY): Payer: Self-pay | Admitting: Emergency Medicine

## 2014-08-10 ENCOUNTER — Emergency Department (HOSPITAL_COMMUNITY): Payer: Self-pay

## 2014-08-10 DIAGNOSIS — R112 Nausea with vomiting, unspecified: Secondary | ICD-10-CM | POA: Insufficient documentation

## 2014-08-10 DIAGNOSIS — R42 Dizziness and giddiness: Secondary | ICD-10-CM | POA: Insufficient documentation

## 2014-08-10 DIAGNOSIS — J449 Chronic obstructive pulmonary disease, unspecified: Secondary | ICD-10-CM | POA: Insufficient documentation

## 2014-08-10 DIAGNOSIS — Z8659 Personal history of other mental and behavioral disorders: Secondary | ICD-10-CM | POA: Insufficient documentation

## 2014-08-10 DIAGNOSIS — G8929 Other chronic pain: Secondary | ICD-10-CM | POA: Insufficient documentation

## 2014-08-10 DIAGNOSIS — Z7982 Long term (current) use of aspirin: Secondary | ICD-10-CM | POA: Insufficient documentation

## 2014-08-10 DIAGNOSIS — D649 Anemia, unspecified: Secondary | ICD-10-CM | POA: Insufficient documentation

## 2014-08-10 DIAGNOSIS — M069 Rheumatoid arthritis, unspecified: Secondary | ICD-10-CM | POA: Insufficient documentation

## 2014-08-10 DIAGNOSIS — K625 Hemorrhage of anus and rectum: Secondary | ICD-10-CM | POA: Insufficient documentation

## 2014-08-10 DIAGNOSIS — R5383 Other fatigue: Secondary | ICD-10-CM | POA: Insufficient documentation

## 2014-08-10 DIAGNOSIS — Z720 Tobacco use: Secondary | ICD-10-CM | POA: Insufficient documentation

## 2014-08-10 DIAGNOSIS — Z79899 Other long term (current) drug therapy: Secondary | ICD-10-CM | POA: Insufficient documentation

## 2014-08-10 DIAGNOSIS — Z7952 Long term (current) use of systemic steroids: Secondary | ICD-10-CM | POA: Insufficient documentation

## 2014-08-10 DIAGNOSIS — I10 Essential (primary) hypertension: Secondary | ICD-10-CM | POA: Insufficient documentation

## 2014-08-10 LAB — COMPREHENSIVE METABOLIC PANEL
ALBUMIN: 3.8 g/dL (ref 3.5–5.0)
ALT: 19 U/L (ref 14–54)
AST: 20 U/L (ref 15–41)
Alkaline Phosphatase: 77 U/L (ref 38–126)
Anion gap: 9 (ref 5–15)
BUN: 8 mg/dL (ref 6–20)
CO2: 26 mmol/L (ref 22–32)
Calcium: 8.8 mg/dL — ABNORMAL LOW (ref 8.9–10.3)
Chloride: 104 mmol/L (ref 101–111)
Creatinine, Ser: 0.62 mg/dL (ref 0.44–1.00)
GFR calc Af Amer: >60 mL/min (ref >60)
GFR calc non Af Amer: >60 mL/min (ref >60)
Glucose, Bld: 162 mg/dL — ABNORMAL HIGH (ref 65–99)
Potassium: 3.5 mmol/L (ref 3.5–5.1)
SODIUM: 139 mmol/L (ref 135–145)
Total Bilirubin: 0.3 mg/dL (ref 0.3–1.2)
Total Protein: 6.8 g/dL (ref 6.5–8.1)

## 2014-08-10 LAB — CBC WITH DIFFERENTIAL/PLATELET
BASOS ABS: 0 10*3/uL (ref 0.0–0.1)
Basophils Relative: 0 % (ref 0–1)
EOS PCT: 0 % (ref 0–5)
Eosinophils Absolute: 0 10*3/uL (ref 0.0–0.7)
HCT: 30.6 % — ABNORMAL LOW (ref 36.0–46.0)
Hemoglobin: 9.1 g/dL — ABNORMAL LOW (ref 12.0–15.0)
Lymphocytes Relative: 8 % — ABNORMAL LOW (ref 12–46)
Lymphs Abs: 1 10*3/uL (ref 0.7–4.0)
MCH: 22.5 pg — ABNORMAL LOW (ref 26.0–34.0)
MCHC: 29.7 g/dL — ABNORMAL LOW (ref 30.0–36.0)
MCV: 75.6 fL — ABNORMAL LOW (ref 78.0–100.0)
Monocytes Absolute: 0.2 10*3/uL (ref 0.1–1.0)
Monocytes Relative: 2 % — ABNORMAL LOW (ref 3–12)
NEUTROS PCT: 90 % — AB (ref 43–77)
Neutro Abs: 11.7 10*3/uL — ABNORMAL HIGH (ref 1.7–7.7)
PLATELETS: 521 10*3/uL — AB (ref 150–400)
RBC: 4.05 MIL/uL (ref 3.87–5.11)
RDW: 20 % — AB (ref 11.5–15.5)
WBC: 12.9 10*3/uL — ABNORMAL HIGH (ref 4.0–10.5)

## 2014-08-10 LAB — URINALYSIS, ROUTINE W REFLEX MICROSCOPIC
BILIRUBIN URINE: NEGATIVE
GLUCOSE, UA: 100 mg/dL — AB
Ketones, ur: NEGATIVE mg/dL
Leukocytes, UA: NEGATIVE
Nitrite: NEGATIVE
PH: 6 (ref 5.0–8.0)
PROTEIN: NEGATIVE mg/dL
Specific Gravity, Urine: 1.02 (ref 1.005–1.030)
UROBILINOGEN UA: 0.2 mg/dL (ref 0.0–1.0)

## 2014-08-10 LAB — URINE MICROSCOPIC-ADD ON

## 2014-08-10 LAB — I-STAT CG4 LACTIC ACID, ED: LACTIC ACID, VENOUS: 1.93 mmol/L (ref 0.5–2.0)

## 2014-08-10 LAB — POC OCCULT BLOOD, ED: Fecal Occult Bld: NEGATIVE

## 2014-08-10 MED ORDER — OMEPRAZOLE 20 MG PO CPDR: 20.0000 mg | DELAYED_RELEASE_CAPSULE | Freq: Every day | ORAL | Status: DC

## 2014-08-10 MED ORDER — PANTOPRAZOLE SODIUM 40 MG IV SOLR
40.0000 mg | Freq: Once | INTRAVENOUS | Status: AC
  Administered 2014-08-10: 40 mg via INTRAVENOUS
  Filled 2014-08-10: qty 40

## 2014-08-10 MED ORDER — ONDANSETRON HCL 4 MG/2ML IJ SOLN
4.0000 mg | Freq: Once | INTRAMUSCULAR | Status: AC
  Administered 2014-08-10: 4 mg via INTRAVENOUS
  Filled 2014-08-10: qty 2

## 2014-08-10 MED ORDER — MORPHINE SULFATE 4 MG/ML IJ SOLN
4.0000 mg | Freq: Once | INTRAMUSCULAR | Status: AC
  Administered 2014-08-10: 4 mg via INTRAVENOUS
  Filled 2014-08-10: qty 1

## 2014-08-10 MED ORDER — IOHEXOL 300 MG/ML  SOLN
100.0000 mL | Freq: Once | INTRAMUSCULAR | Status: AC | PRN
  Administered 2014-08-10: 100 mL via INTRAVENOUS

## 2014-08-10 MED ORDER — SODIUM CHLORIDE 0.9 % IV BOLUS (SEPSIS)
1000.0000 mL | Freq: Once | INTRAVENOUS | Status: AC
  Administered 2014-08-10: 1000 mL via INTRAVENOUS

## 2014-08-10 MED ORDER — IOHEXOL 300 MG/ML  SOLN
25.0000 mL | Freq: Once | INTRAMUSCULAR | Status: AC | PRN
  Administered 2014-08-10: 25 mL via ORAL

## 2014-08-10 MED ORDER — HYDROCODONE-ACETAMINOPHEN 5-325 MG PO TABS
1.0000 | ORAL_TABLET | Freq: Once | ORAL | Status: AC
  Administered 2014-08-10: 1 via ORAL
  Filled 2014-08-10: qty 1

## 2014-08-10 MED ORDER — ONDANSETRON HCL 4 MG PO TABS: 4.0000 mg | ORAL_TABLET | Freq: Four times a day (QID) | ORAL | Status: DC

## 2014-08-10 NOTE — ED Provider Notes (Signed)
CSN: [id number]     Arrival date & time 08/10/14  1756 History   First MD Initiated Contact with Patient 08/10/14 1822     Chief Complaint  Patient presents with  . Abdominal Pain     (Consider location/radiation/quality/duration/timing/severity/associated sxs/prior Treatment) HPI Comments:  Patient reports one day history of diffuse abdominal tenderness with 2 episodes of bloody bowel movements today. She [facility] the [facility] and was sent to the ED. She reports brown stool with right red blood in the toilet bowl and blood with wiping. She also had some nausea with multiple episodes of emesis today. She feels the last episode of emesis was coffee grounds. She has been taking anti-inflammatory's and prednisone for her rheumatoid arthritis. She has no history of ulcers. She has never had a colonoscopy. She denies any chest pain or shortness of breath. She endorses some dizziness with standing. She does not take any blood thinners. Denies any alcohol use. She had a similar presentation in June that showed a normal CT scan. This the first time she's had coffee-ground emesis.  The history is provided by the patient.    Past Medical History  Diagnosis Date  . Rheumatoid arthritis(714.0)   . Chronic anemia   . Hypertension   . Arthritis, rheumatoid   . Incidental lung nodule   . Depression   . Chronic pain   . Chronic back pain   . COPD (chronic obstructive pulmonary disease)    Past Surgical History  Procedure Laterality Date  . Tubal ligation    . Endometrial ablation    . Breast cyst excision  12/31/2010    Procedure: CYST EXCISION BREAST;  Surgeon: Fabio Bering, MD;  Location: [facility];  Service: General;  Laterality: Right;  Excision Sebaceous Cyst Right Breast   Family History  Problem Relation Age of Onset  . Anesthesia problems Neg Hx   . Cancer Father    History  Substance Use Topics  . Smoking status: Current Every Day Smoker -- 1.00 packs/day for 30 years     Types: Cigarettes  . Smokeless tobacco: Never Used  . Alcohol Use: No     Comment: occassional   OB History    Gravida Para Term Preterm AB TAB SAB Ectopic Multiple Living   6 5 5  1  1   5      Review of Systems  Constitutional: Positive for activity change, appetite change and fatigue. Negative for fever.  HENT: Negative for congestion and rhinorrhea.   Eyes: Negative for visual disturbance.  Respiratory: Negative for cough, chest tightness and shortness of breath.   Cardiovascular: Negative for chest pain.  Gastrointestinal: Positive for nausea, vomiting, abdominal pain and blood in stool.  Genitourinary: Negative for dysuria, hematuria, vaginal bleeding and vaginal discharge.  Musculoskeletal: Negative for myalgias, back pain and arthralgias.  Skin: Negative for rash.  Neurological: Positive for dizziness and weakness. Negative for light-headedness, numbness and headaches.  A complete 10 system review of systems was obtained and all systems are negative except as noted in the HPI and PMH.      Allergies  Codeine  Home Medications   Prior to Admission medications   Medication Sig Start Date End Date Taking? Authorizing Provider  albuterol (PROVENTIL HFA;VENTOLIN HFA) 108 (90 BASE) MCG/ACT inhaler Inhale 2 puffs into the lungs every 6 (six) hours as needed for wheezing or shortness of breath.   Yes Historical Provider, MD  aspirin EC 325 MG tablet Take 325-650 mg by mouth  2 (two) times daily as needed for mild pain or moderate pain.   Yes Historical Provider, MD  Aspirin-Salicylamide-Caffeine (BC HEADACHE) 325-95-16 MG TABS Take 2 packets by mouth daily as needed (pain).    Yes Historical Provider, MD  Calcium Carbonate-Vitamin D (CALCIUM 600 + D PO) Take 1 tablet by mouth every morning.    Yes Historical Provider, MD  diclofenac (VOLTAREN) 75 MG EC tablet Take 75 mg by mouth 2 (two) times daily as needed for mild pain.   Yes Historical Provider, MD  ferrous sulfate 325 (65 FE)  MG tablet Take 325 mg by mouth daily. For anemia   Yes Historical Provider, MD  folic acid (FOLVITE) 1 MG tablet Take 1 mg by mouth daily.     Yes Historical Provider, MD  hydroxychloroquine (PLAQUENIL) 200 MG tablet Take 200 mg by mouth 3 (three) times daily.    Yes Historical Provider, MD  methotrexate (RHEUMATREX) 2.5 MG tablet Take 12.5 mg by mouth 2 (two) times a week. Monday and Tuesday. Caution:Chemotherapy. Protect from light.   Yes Historical Provider, MD  Multiple Vitamin (MULTIVITAMIN WITH MINERALS) TABS Take 1 tablet by mouth daily.   Yes Historical Provider, MD  predniSONE (DELTASONE) 10 MG tablet Take 20 mg by mouth 2 (two) times daily. 2 week course starting on 08/06/14   Yes Historical Provider, MD  HYDROcodone-acetaminophen (NORCO/VICODIN) 5-325 MG per tablet Take one tab po q 4-6 hrs prn pain Patient taking differently: Take 1 tablet by mouth. Take one tab po q 4-6 hrs prn pain 07/24/14   Tammy Triplett, PA-C  omeprazole (PRILOSEC) 20 MG capsule Take 1 capsule (20 mg total) by mouth daily. 08/10/14   Glynn Octave, MD  ondansetron (ZOFRAN) 4 MG tablet Take 1 tablet (4 mg total) by mouth every 6 (six) hours. 08/10/14   Glynn Octave, MD  promethazine (PHENERGAN) 12.5 MG tablet Take 1 tablet (12.5 mg total) by mouth every 6 (six) hours as needed for nausea or vomiting. Patient not taking: Reported on 04/04/2014 02/24/14   Janne Napoleon, NP   BP 150/102 mmHg  Pulse 65  Temp(Src) 98.4 F (36.9 C) (Oral)  Resp 16  Ht 5\' 6"  (1.676 m)  Wt 160 lb (72.576 kg)  BMI 25.84 kg/m2  SpO2 98% Physical Exam  Constitutional: She is oriented to person, place, and time. She appears well-developed and well-nourished. No distress.  HENT:  Head: Normocephalic and atraumatic.  Mouth/Throat: Oropharynx is clear and moist. No oropharyngeal exudate.  Eyes: Conjunctivae and EOM are normal. Pupils are equal, round, and reactive to light.  Neck: Normal range of motion. Neck supple.  No meningismus.   Cardiovascular: Normal rate, regular rhythm, normal heart sounds and intact distal pulses.   No murmur heard. Pulmonary/Chest: Effort normal and breath sounds normal. No respiratory distress.  Abdominal: Soft. She exhibits distension. There is tenderness. There is no rebound and no guarding.   Distended abdomen, diffusely tender, no peritoneal signs  Genitourinary: Guaiac negative stool.   Chaperone present, no external hemorrhoids or fissures. No gross blood.  Musculoskeletal: Normal range of motion. She exhibits no edema or tenderness.  Neurological: She is alert and oriented to person, place, and time. No cranial nerve deficit. She exhibits normal muscle tone. Coordination normal.  No ataxia on finger to nose bilaterally. No pronator drift. 5/5 strength throughout. CN 2-12 intact. Negative Romberg. Equal grip strength. Sensation intact. Gait is normal.   Skin: Skin is warm.  Psychiatric: She has a normal mood and affect.  Her behavior is normal.  Nursing note and vitals reviewed.   ED Course  Procedures (including critical care time) Labs Review Labs Reviewed  CBC WITH DIFFERENTIAL/PLATELET - Abnormal; Notable for the following:    WBC 12.9 (*)    Hemoglobin 9.1 (*)    HCT 30.6 (*)    MCV 75.6 (*)    MCH 22.5 (*)    MCHC 29.7 (*)    RDW 20.0 (*)    Platelets 521 (*)    Neutrophils Relative % 90 (*)    Neutro Abs 11.7 (*)    Lymphocytes Relative 8 (*)    Monocytes Relative 2 (*)    All other components within normal limits  COMPREHENSIVE METABOLIC PANEL - Abnormal; Notable for the following:    Glucose, Bld 162 (*)    Calcium 8.8 (*)    All other components within normal limits  URINALYSIS, ROUTINE W REFLEX MICROSCOPIC (NOT AT [facility]) - Abnormal; Notable for the following:    Glucose, UA 100 (*)    Hgb urine dipstick SMALL (*)    All other components within normal limits  URINE MICROSCOPIC-ADD ON  POC OCCULT BLOOD, ED  I-STAT CG4 LACTIC ACID, ED  I-STAT CG4 LACTIC ACID,  ED    Imaging Review Dg Chest 2 View  08/10/2014   CLINICAL DATA:  Generalized abdominal pain, bloating. Bright red blood in bowel movements since this morning. Fever.  EXAM: CHEST  2 VIEW  COMPARISON:  05/23/2014  FINDINGS: The heart size and mediastinal contours are within normal limits. Both lungs are clear. The visualized skeletal structures are unremarkable.  IMPRESSION: No active cardiopulmonary disease.   Electronically Signed   By: Charlett Nose M.D.   On: 08/10/2014 20:04   Ct Abdomen Pelvis W Contrast  08/10/2014   CLINICAL DATA:  Abdominal pain and bloating. Bright red blood per rectum. Nausea. Emesis. Blood in bowel movement for 2 days.  EXAM: CT ABDOMEN AND PELVIS WITH CONTRAST  TECHNIQUE: Multidetector CT imaging of the abdomen and pelvis was performed using the standard protocol following bolus administration of intravenous contrast.  CONTRAST:  67mL OMNIPAQUE IOHEXOL 300 MG/ML SOLN, OMNIPAQUE IOHEXOL 300 MG/ML SOLN  COMPARISON:  CT 07/04/2014  FINDINGS: Lower chest: Lung bases are clear.  Hepatobiliary: No focal hepatic lesion. No biliary duct dilatation. Gallbladder is normal. Common bile duct is normal.  Pancreas: Pancreas is normal. No ductal dilatation. No pancreatic inflammation.  Spleen: Normal spleen  Adrenals/urinary tract: Adrenal glands and kidneys are normal. The ureters and bladder normal.  Stomach/Bowel: Stomach, small bowel, appendix, and cecum are normal. The colon and rectosigmoid colon are normal.  Vascular/Lymphatic: Abdominal aorta is normal caliber with atherosclerotic calcification. There is no retroperitoneal or periportal lymphadenopathy. No pelvic lymphadenopathy.  Reproductive: Uterus and ovaries are normal.  Musculoskeletal: No aggressive osseous lesion.  Other: No free fluid.  IMPRESSION: 1. No explanation for gastrointestinal bleeding. Consider colonoscopy. 2.  Atherosclerotic calcification of the abdominal aorta.   Electronically Signed   By: Genevive Bi  M.D.   On: 08/10/2014 20:20     EKG Interpretation None      MDM   Final diagnoses:  Rectal bleeding   Diffuse abdominal pain with bloody bowel movements today and dark-appearing emesis. Orthostatics negative.   Hemoglobin is stable. Heme occult is negative. FOBT negative.   gastric lavage is negative. No evidence of serious or significant GI bleed. Hemoglobin is negative. Hemoglobin is at baseline. Vitals are stable.   CT shows no explanation.  for patient's abdominal pain.   We'll start PPI. Referred to gastroenterology for endoscopy. Patient instructed to avoid alcohol, caffeine, antiinflammatories and spicy foods.  Needs to D/c NSAID use.  Return precautions discussed.  BP 150/102 mmHg  Pulse 65  Temp(Src) 98.4 F (36.9 C) (Oral)  Resp 16  Ht 5\' 6"  (1.676 m)  Wt 160 lb (72.576 kg)  BMI 25.84 kg/m2  SpO2 98%    , MD 08/11/14 0105

## 2014-08-10 NOTE — Discharge Instructions (Signed)
Abdominal Pain  take the stomach medication as prescribed and follow-up with the stomach doctor. Do not use alcohol, caffeine, anti-inflammatory medications , spicy foods. Return to the ED if you develop new or worsening symptoms. Many things can cause abdominal pain. Usually, abdominal pain is not caused by a disease and will improve without treatment. It can often be observed and treated at home. Your health care provider will do a physical exam and possibly order blood tests and X-rays to help determine the seriousness of your pain. However, in many cases, more time must pass before a clear cause of the pain can be found. Before that point, your health care provider may not know if you need more testing or further treatment. HOME CARE INSTRUCTIONS  Monitor your abdominal pain for any changes. The following actions may help to alleviate any discomfort you are experiencing:  Only take over-the-counter or prescription medicines as directed by your health care provider.  Do not take laxatives unless directed to do so by your health care provider.  Try a clear liquid diet (broth, tea, or water) as directed by your health care provider. Slowly move to a bland diet as tolerated. SEEK MEDICAL CARE IF:  You have unexplained abdominal pain.  You have abdominal pain associated with nausea or diarrhea.  You have pain when you urinate or have a bowel movement.  You experience abdominal pain that wakes you in the night.  You have abdominal pain that is worsened or improved by eating food.  You have abdominal pain that is worsened with eating fatty foods.  You have a fever. SEEK IMMEDIATE MEDICAL CARE IF:   Your pain does not go away within 2 hours.  You keep throwing up (vomiting).  Your pain is felt only in portions of the abdomen, such as the right side or the left lower portion of the abdomen.  You pass bloody or black tarry stools. MAKE SURE YOU:  Understand these instructions.    Will watch your condition.   Will get help right away if you are not doing well or get worse.  Document Released: 10/08/2004 Document Revised: 01/03/2013 Document Reviewed: 09/07/2012 [facility] Patient Information 2015 [place], [place]. This information is not intended to replace advice given to you by your health care provider. Make sure you discuss any questions you have with your health care provider.

## 2014-08-10 NOTE — ED Notes (Signed)
NG tube placed by Buzzy Han, RN. Placement confirmed by auscultation and immediate return of gastric contents. NG tube flushed with approximately 240 cc sterile water, no blood returned. Dr. Manus Gunning notified.

## 2014-08-10 NOTE — ED Notes (Signed)
PT c/o abdominal pain and bloating with bright red blood in bowel movements x2 today and stated nausea with black emesis x3 today. PT states she was seen at the health department and told to come to ED for eval.

## 2014-08-11 ENCOUNTER — Telehealth: Payer: Self-pay | Admitting: Gastroenterology

## 2014-08-11 NOTE — Telephone Encounter (Signed)
PT NEEDS NPV Dx: RECTAL BLEEDING WITIN NEXT 3-4 WEEKS W/ MD OR EXTENDER.

## 2014-08-13 ENCOUNTER — Encounter: Payer: Self-pay | Admitting: Gastroenterology

## 2014-08-13 NOTE — Telephone Encounter (Signed)
APPOINTMENT MADE AND LETTER SENT °

## 2014-08-26 ENCOUNTER — Emergency Department (HOSPITAL_COMMUNITY)
Admission: EM | Admit: 2014-08-26 | Discharge: 2014-08-26 | Disposition: A | Discharge status: Home / Self Care | Payer: Self-pay | Attending: Emergency Medicine | Admitting: Emergency Medicine

## 2014-08-26 ENCOUNTER — Encounter (HOSPITAL_COMMUNITY): Payer: Self-pay | Admitting: Emergency Medicine

## 2014-08-26 DIAGNOSIS — Z79899 Other long term (current) drug therapy: Secondary | ICD-10-CM | POA: Insufficient documentation

## 2014-08-26 DIAGNOSIS — J449 Chronic obstructive pulmonary disease, unspecified: Secondary | ICD-10-CM | POA: Insufficient documentation

## 2014-08-26 DIAGNOSIS — Z720 Tobacco use: Secondary | ICD-10-CM | POA: Insufficient documentation

## 2014-08-26 DIAGNOSIS — D649 Anemia, unspecified: Secondary | ICD-10-CM | POA: Insufficient documentation

## 2014-08-26 DIAGNOSIS — I10 Essential (primary) hypertension: Secondary | ICD-10-CM | POA: Insufficient documentation

## 2014-08-26 DIAGNOSIS — M25571 Pain in right ankle and joints of right foot: Secondary | ICD-10-CM | POA: Insufficient documentation

## 2014-08-26 DIAGNOSIS — Z7952 Long term (current) use of systemic steroids: Secondary | ICD-10-CM | POA: Insufficient documentation

## 2014-08-26 DIAGNOSIS — F329 Major depressive disorder, single episode, unspecified: Secondary | ICD-10-CM | POA: Insufficient documentation

## 2014-08-26 DIAGNOSIS — Z7982 Long term (current) use of aspirin: Secondary | ICD-10-CM | POA: Insufficient documentation

## 2014-08-26 DIAGNOSIS — M069 Rheumatoid arthritis, unspecified: Secondary | ICD-10-CM | POA: Insufficient documentation

## 2014-08-26 DIAGNOSIS — G8929 Other chronic pain: Secondary | ICD-10-CM | POA: Insufficient documentation

## 2014-08-26 MED ORDER — HYDROCODONE-ACETAMINOPHEN 5-325 MG PO TABS: 1.0000 | ORAL_TABLET | ORAL | Status: DC | PRN

## 2014-08-26 NOTE — ED Notes (Addendum)
Pt reports bilateral foot pain/swelling x2 days. Pt denies any known injury.

## 2014-08-26 NOTE — Discharge Instructions (Signed)
Rheumatoid Arthritis Rheumatoid arthritis is a long-term (chronic) inflammatory disease that causes pain, swelling, and stiffness of the joints. It can affect the entire body, including the eyes and lungs. The effects of rheumatoid arthritis vary widely among those with the condition. CAUSES  The cause of rheumatoid arthritis is not known. It tends to run in families and is more common in women. Certain cells of the body's natural defense system (immune system) do not work properly and begin to attack healthy joints. It primarily involves the connective tissue that lines the joints (synovial membrane). This can cause damage to the joint. SYMPTOMS   Pain, stiffness, swelling, and decreased motion of many joints, especially in the hands and feet.  Stiffness that is worse in the morning. It may last 1-2 hours or longer.  Numbness and tingling in the hands.  Fatigue.  Loss of appetite.  Weight loss.  Low-grade fever.  Dry eyes and mouth.  Firm lumps (rheumatoid nodules) that grow beneath the skin in areas such as the elbows and hands. DIAGNOSIS  Diagnosis is based on the symptoms described, an exam, and blood tests. Sometimes, X-rays are helpful. TREATMENT  The goals of treatment are to relieve pain, reduce inflammation, and to slow down or stop joint damage and disability. Methods vary and may include:  Maintaining a balance of rest, exercise, and proper nutrition.  Medicines:  Pain relievers (analgesics).  Corticosteroids and nonsteroidal anti-inflammatory drugs (NSAIDs) to reduce inflammation.  Disease-modifying antirheumatic drugs (DMARDs) to try to slow the course of the disease.  Biologic response modifiers to reduce inflammation and damage.  Physical therapy and occupational therapy.  Surgery for patients with severe joint damage. Joint replacement or fusing of joints may be needed.  Routine monitoring and ongoing care, such as office visits, blood and urine tests, and  X-rays. HOME CARE INSTRUCTIONS   Remain physically active and reduce activity when the disease gets worse.  Eat a well-balanced diet.  Put heat on affected joints when you wake up and before activities. Keep the heat on the affected joint for as long as directed by your health care provider.  Put ice on affected joints following activities or exercising.  Put ice in a plastic bag.  Place a towel between your skin and the bag.  Leave the ice on for 15-20 minutes, 3-4 times per day, or as directed by your health care provider.  Take medicines and supplements only as directed by your health care provider.  Use splints as directed by your health care provider. Splints help maintain joint position and function.  Do not sleep with pillows under your knees. This may lead to spasms.  Participate in a self-management program to keep current with the latest treatment and coping skills. SEEK IMMEDIATE MEDICAL CARE IF:  You have fainting episodes.  You have periods of extreme weakness.  You rapidly develop a hot, painful joint that is more severe than usual joint aches.  You have chills.  You have a fever. FOR MORE INFORMATION   American [employer] Rheumatology: www.rheumatology.org  Arthritis Foundation: www.arthritis.org Document Released: 12/27/1999 Document Revised: 05/15/2013 Document Reviewed: 02/04/2011 [facility] Patient Information 2015 [place], [place]. This information is not intended to replace advice given to you by your health care provider. Make sure you discuss any questions you have with your health care provider.   Plan to follow up with your new provider on Wednesday as planned.  In the interim, continue taking your prednisone and the protonix to protect your stomach. You  may take the hydrocdone prescribed for pain relief.  This will make you drowsy - do not drive within 4 hours of taking this medication.

## 2014-08-26 NOTE — ED Provider Notes (Signed)
CSN: [id number]     Arrival date & time 08/26/14  1052 History  This chart was scribed for non-physician practitioner, Burgess Amor, PA-C, working with Bethann Berkshire, MD, by Ronney Lion, ED Scribe. This patient was seen in room APFT23/APFT23 and the patient's care was started at 12:39 PM.    Chief Complaint  Patient presents with  . Foot Pain   The history is provided by the patient and medical records. No language interpreter was used.    HPI Comments: [name] is a 49 y.o. female with a history of RA, who presents to the Emergency Department complaining of worsening, constant, gradual onset bilateral ankle pain and swelling that began 3-4 weeks ago and acutely worsened this week which she associated with a rheumatoid arthritis flare. She states walking and bearing weight both exacerbate her pain. Patient has an appointment with the [facility] in about 3 days, where she will be referred to a specialist. She requests medication to handle her pain until then. Patient has been taking prednisone 20mg  BID, which has reduced her bilateral foot swelling; however, she states the health department is trying to wean her off prednisone due to side effects of weight gain. She has taken Arthritis BC's with minimal relief. Patient states she has taken Toradol in the past for her arthritic pain, which provided significant relief.      Past Medical History  Diagnosis Date  . Rheumatoid arthritis(714.0)   . Chronic anemia   . Hypertension   . Arthritis, rheumatoid   . Incidental lung nodule   . Depression   . Chronic pain   . Chronic back pain   . COPD (chronic obstructive pulmonary disease)    Past Surgical History  Procedure Laterality Date  . Tubal ligation    . Endometrial ablation    . Breast cyst excision  12/31/2010    Procedure: CYST EXCISION BREAST;  Surgeon: 01/02/2011, MD;  Location: [facility];  Service: General;  Laterality: Right;  Excision Sebaceous Cyst Right Breast    Family History  Problem Relation Age of Onset  . Anesthesia problems Neg Hx   . Cancer Father    Social History  Substance Use Topics  . Smoking status: Current Every Day Smoker -- 1.00 packs/day for 30 years    Types: Cigarettes  . Smokeless tobacco: Never Used  . Alcohol Use: No     Comment: occassional   OB History    Gravida Para Term Preterm AB TAB SAB Ectopic Multiple Living   6 5 5  1  1   5      Review of Systems  Constitutional: Negative for fever.  Musculoskeletal: Positive for joint swelling and arthralgias (bilateral ankle pain). Negative for myalgias.  Neurological: Negative for weakness and numbness.      Allergies  Codeine  Home Medications   Prior to Admission medications   Medication Sig Start Date End Date Taking? Authorizing Provider  albuterol (PROVENTIL HFA;VENTOLIN HFA) 108 (90 BASE) MCG/ACT inhaler Inhale 2 puffs into the lungs every 6 (six) hours as needed for wheezing or shortness of breath.   Yes Historical Provider, MD  aspirin EC 325 MG tablet Take 325-650 mg by mouth 2 (two) times daily as needed for mild pain or moderate pain.   Yes Historical Provider, MD  Calcium Carbonate-Vitamin D (CALCIUM 600 + D PO) Take 1 tablet by mouth every morning.    Yes Historical Provider, MD  ferrous sulfate 325 (65 FE) MG tablet  Take 325 mg by mouth daily. For anemia   Yes Historical Provider, MD  folic acid (FOLVITE) 1 MG tablet Take 1 mg by mouth daily.     Yes Historical Provider, MD  hydroxychloroquine (PLAQUENIL) 200 MG tablet Take 200 mg by mouth 3 (three) times daily.    Yes Historical Provider, MD  methotrexate (RHEUMATREX) 2.5 MG tablet Take 12.5 mg by mouth 2 (two) times a week. Monday and Tuesday. Caution:Chemotherapy. Protect from light.   Yes Historical Provider, MD  Multiple Vitamin (MULTIVITAMIN WITH MINERALS) TABS Take 1 tablet by mouth daily.   Yes Historical Provider, MD  omeprazole (PRILOSEC) 20 MG capsule Take 1 capsule (20 mg total) by  mouth daily. 08/10/14  Yes Glynn Octave, MD  ondansetron (ZOFRAN) 4 MG tablet Take 1 tablet (4 mg total) by mouth every 6 (six) hours. 08/10/14  Yes Glynn Octave, MD  predniSONE (DELTASONE) 10 MG tablet Take 20 mg by mouth 2 (two) times daily. 2 week course starting on 08/06/14   Yes Historical Provider, MD  HYDROcodone-acetaminophen (NORCO/VICODIN) 5-325 MG per tablet Take 1 tablet by mouth every 4 (four) hours as needed. 08/26/14   Burgess Amor, PA-C  promethazine (PHENERGAN) 12.5 MG tablet Take 1 tablet (12.5 mg total) by mouth every 6 (six) hours as needed for nausea or vomiting. Patient not taking: Reported on 04/04/2014 02/24/14   Janne Napoleon, NP   BP 124/76 mmHg  Pulse 90  Temp(Src) 97.8 F (36.6 C) (Oral)  Resp 16  Ht 5\' 6"  (1.676 m)  Wt 165 lb (74.844 kg)  BMI 26.64 kg/m2  SpO2 98% Physical Exam  Constitutional: She appears well-developed and well-nourished.  HENT:  Head: Atraumatic.  Neck: Normal range of motion.  Cardiovascular:  Pulses equal bilaterally  Musculoskeletal: She exhibits tenderness.  Tenderness to palpation bilaterally at her medial ankles. There is mild erythema and edema bilaterally. Normal pedal pulses, with <2 second cap refill in her toes. FROM of ankle joints, although with pain. There is no red streaking. No fluctuance or other suggestion for an infectious source of her symptoms.   Neurological: She is alert. She has normal strength. She displays normal reflexes. No sensory deficit.  Skin: Skin is warm and dry.  Scattered, small papules on lower legs, 2 on her neck, suggesting probable insect bites.   Psychiatric: She has a normal mood and affect.  Nursing note and vitals reviewed.   ED Course  Procedures (including critical care time)  DIAGNOSTIC STUDIES: Oxygen Saturation is 95% on RA, normal by my interpretation.    COORDINATION OF CARE: 12:44 PM - Discussed with pt my concern over prescribing NSAIDs, given her recent history of coffee-ground  emesis on 08/10/14, per chart review. Discussed treatment plan with pt at bedside which includes short-term course of pain medication. Pt verbalized understanding and agreed to plan.   Labs Review Labs Reviewed - No data to display  Imaging Review No results found.   EKG Interpretation None      MDM   Final diagnoses:  Rheumatoid arthritis flare   Hydrocodone prescribed.  Advised activity as tolerated.  [facility].  Exam is c/w rheumatoid, bilateral and chronic.  No signs or hx of injury. No h/o gout.  The patient appears reasonably screened and/or stabilized for discharge and I doubt any other medical condition or other [facility] requiring further screening, evaluation, or treatment in the ED at this time prior to discharge.   I personally performed  the services described in this documentation, which was scribed in my presence. The recorded information has been reviewed and is accurate.   Burgess Amor, PA-C 08/28/14 1441  Bethann Berkshire, MD 08/30/14 1021

## 2014-09-10 ENCOUNTER — Encounter: Payer: Self-pay | Admitting: Gastroenterology

## 2014-09-10 ENCOUNTER — Telehealth: Payer: Self-pay | Admitting: Gastroenterology

## 2014-09-10 ENCOUNTER — Ambulatory Visit: Payer: Self-pay | Admitting: Gastroenterology

## 2014-09-10 NOTE — Telephone Encounter (Signed)
PATIENT WAS A NO SHOW AND LETTER SENT  °

## 2014-09-14 ENCOUNTER — Encounter (HOSPITAL_COMMUNITY): Payer: Self-pay | Admitting: Emergency Medicine

## 2014-09-14 ENCOUNTER — Emergency Department (HOSPITAL_COMMUNITY)
Admission: EM | Admit: 2014-09-14 | Discharge: 2014-09-14 | Disposition: A | Discharge status: Home / Self Care | Payer: Self-pay | Attending: Physician Assistant | Admitting: Physician Assistant

## 2014-09-14 DIAGNOSIS — Z720 Tobacco use: Secondary | ICD-10-CM | POA: Insufficient documentation

## 2014-09-14 DIAGNOSIS — M25562 Pain in left knee: Secondary | ICD-10-CM | POA: Insufficient documentation

## 2014-09-14 DIAGNOSIS — D649 Anemia, unspecified: Secondary | ICD-10-CM | POA: Insufficient documentation

## 2014-09-14 DIAGNOSIS — Z8739 Personal history of other diseases of the musculoskeletal system and connective tissue: Secondary | ICD-10-CM

## 2014-09-14 DIAGNOSIS — M25561 Pain in right knee: Secondary | ICD-10-CM | POA: Insufficient documentation

## 2014-09-14 DIAGNOSIS — M25572 Pain in left ankle and joints of left foot: Secondary | ICD-10-CM | POA: Insufficient documentation

## 2014-09-14 DIAGNOSIS — Z79899 Other long term (current) drug therapy: Secondary | ICD-10-CM | POA: Insufficient documentation

## 2014-09-14 DIAGNOSIS — Z7982 Long term (current) use of aspirin: Secondary | ICD-10-CM | POA: Insufficient documentation

## 2014-09-14 DIAGNOSIS — Z8659 Personal history of other mental and behavioral disorders: Secondary | ICD-10-CM | POA: Insufficient documentation

## 2014-09-14 DIAGNOSIS — J449 Chronic obstructive pulmonary disease, unspecified: Secondary | ICD-10-CM | POA: Insufficient documentation

## 2014-09-14 DIAGNOSIS — M1990 Unspecified osteoarthritis, unspecified site: Secondary | ICD-10-CM | POA: Insufficient documentation

## 2014-09-14 DIAGNOSIS — M2550 Pain in unspecified joint: Secondary | ICD-10-CM

## 2014-09-14 DIAGNOSIS — M069 Rheumatoid arthritis, unspecified: Secondary | ICD-10-CM | POA: Insufficient documentation

## 2014-09-14 DIAGNOSIS — I10 Essential (primary) hypertension: Secondary | ICD-10-CM | POA: Insufficient documentation

## 2014-09-14 DIAGNOSIS — M25571 Pain in right ankle and joints of right foot: Secondary | ICD-10-CM | POA: Insufficient documentation

## 2014-09-14 DIAGNOSIS — G8929 Other chronic pain: Secondary | ICD-10-CM | POA: Insufficient documentation

## 2014-09-14 MED ORDER — HYDROCODONE-ACETAMINOPHEN 5-325 MG PO TABS
2.0000 | ORAL_TABLET | Freq: Once | ORAL | Status: AC
  Administered 2014-09-14: 2 via ORAL
  Filled 2014-09-14: qty 2

## 2014-09-14 MED ORDER — NABUMETONE 500 MG PO TABS: 500.0000 mg | ORAL_TABLET | Freq: Two times a day (BID) | ORAL | Status: DC

## 2014-09-14 NOTE — ED Notes (Signed)
Pt c/o of pain and swelling in bilateral ankles, knees, and elbows. Pt recently diagnosed with rheumatoid arthritis. Pt states pain in ankles is so bad she has difficulty ambulating.

## 2014-09-14 NOTE — ED Provider Notes (Signed)
CSN: [id number]     Arrival date & time 09/14/14  1712 History   First MD Initiated Contact with Patient 09/14/14 1751     Chief Complaint  Patient presents with  . Joint Swelling     (Consider location/radiation/quality/duration/timing/severity/associated sxs/prior Treatment) HPI Comments: Patient with a history of rheumatoid arthritis, previously controlled under rheumatology with Methotrexate and Plaquenil, now on prednisone only, returns to the emergency department with uncontrolled pain. No fever, redness or joint swelling. She complains of significant pain in the ankles and knees. No calf or thigh pain.   The history is provided by the patient. No language interpreter was used.    Past Medical History  Diagnosis Date  . Rheumatoid arthritis(714.0)   . Chronic anemia   . Hypertension   . Arthritis, rheumatoid   . Incidental lung nodule   . Depression   . Chronic pain   . Chronic back pain   . COPD (chronic obstructive pulmonary disease)    Past Surgical History  Procedure Laterality Date  . Tubal ligation    . Endometrial ablation    . Breast cyst excision  12/31/2010    Procedure: CYST EXCISION BREAST;  Surgeon: Fabio Bering, MD;  Location: [facility];  Service: General;  Laterality: Right;  Excision Sebaceous Cyst Right Breast   Family History  Problem Relation Age of Onset  . Anesthesia problems Neg Hx   . Cancer Father    Social History  Substance Use Topics  . Smoking status: Current Every Day Smoker -- 1.00 packs/day for 30 years    Types: Cigarettes  . Smokeless tobacco: Never Used  . Alcohol Use: No     Comment: occassional   OB History    Gravida Para Term Preterm AB TAB SAB Ectopic Multiple Living   6 5 5  1  1   5      Review of Systems  Constitutional: Negative for fever and chills.  Respiratory: Negative.  Negative for shortness of breath.   Musculoskeletal:       See HPI.  Skin: Negative.  Negative for color change.  Neurological: Negative.   Negative for numbness.      Allergies  Codeine  Home Medications   Prior to Admission medications   Medication Sig Start Date End Date Taking? Authorizing Provider  albuterol (PROVENTIL HFA;VENTOLIN HFA) 108 (90 BASE) MCG/ACT inhaler Inhale 2 puffs into the lungs every 6 (six) hours as needed for wheezing or shortness of breath.    Historical Provider, MD  aspirin EC 325 MG tablet Take 325-650 mg by mouth 2 (two) times daily as needed for mild pain or moderate pain.    Historical Provider, MD  Calcium Carbonate-Vitamin D (CALCIUM 600 + D PO) Take 1 tablet by mouth every morning.     Historical Provider, MD  ferrous sulfate 325 (65 FE) MG tablet Take 325 mg by mouth daily. For anemia    Historical Provider, MD  folic acid (FOLVITE) 1 MG tablet Take 1 mg by mouth daily.      Historical Provider, MD  HYDROcodone-acetaminophen (NORCO/VICODIN) 5-325 MG per tablet Take 1 tablet by mouth every 4 (four) hours as needed. 08/26/14   08/28/14, PA-C  hydroxychloroquine (PLAQUENIL) 200 MG tablet Take 200 mg by mouth 3 (three) times daily.     Historical Provider, MD  methotrexate (RHEUMATREX) 2.5 MG tablet Take 12.5 mg by mouth 2 (two) times a week. Monday and Tuesday. Caution:Chemotherapy. Protect from light.    Historical Provider,  MD  Multiple Vitamin (MULTIVITAMIN WITH MINERALS) TABS Take 1 tablet by mouth daily.    Historical Provider, MD  omeprazole (PRILOSEC) 20 MG capsule Take 1 capsule (20 mg total) by mouth daily. 08/10/14   Glynn Octave, MD  ondansetron (ZOFRAN) 4 MG tablet Take 1 tablet (4 mg total) by mouth every 6 (six) hours. 08/10/14   Glynn Octave, MD  predniSONE (DELTASONE) 10 MG tablet Take 20 mg by mouth 2 (two) times daily. 2 week course starting on 08/06/14    Historical Provider, MD  promethazine (PHENERGAN) 12.5 MG tablet Take 1 tablet (12.5 mg total) by mouth every 6 (six) hours as needed for nausea or vomiting. Patient not taking: Reported on 04/04/2014 02/24/14   Janne Napoleon, NP   BP 136/95 mmHg  Pulse 87  Temp(Src) 98.3 F (36.8 C) (Oral)  Resp 18  Ht 5\' 6"  (1.676 m)  Wt 160 lb (72.576 kg)  BMI 25.84 kg/m2  SpO2 97% Physical Exam  Constitutional: She is oriented to person, place, and time. She appears well-developed and well-nourished.  Neck: Normal range of motion.  Pulmonary/Chest: Effort normal.  Musculoskeletal: She exhibits tenderness. She exhibits no edema.  Ankles and knees are bilaterally unremarkable in appearance. Tender without redness or warmth. No calf or thigh tenderness.   Neurological: She is alert and oriented to person, place, and time.  Skin: Skin is warm and dry.  Psychiatric: She has a normal mood and affect.    ED Course  Procedures (including critical care time) Labs Review Labs Reviewed - No data to display  Imaging Review No results found. I have personally reviewed and evaluated these images and lab results as part of my medical decision-making.   EKG Interpretation None      MDM   Final diagnoses:  None    1. Arthralgia 2. History or rheumatoid arthritis  Chart reviewed. Patient has been seen here regularly for pain control with multiple narcotic prescriptions. Discussed that chronic pain is not appropriately managed in the emergency department and that we would give her one dose here without prescriptions to go home with. Discussed use of Relafen for pain with caution to stop with any epigastric pain or vomiting.     , PA-C 09/14/14 1817  Courteney 11/14/14, MD 09/14/14 1958

## 2014-09-14 NOTE — Discharge Instructions (Signed)

## 2014-09-18 ENCOUNTER — Emergency Department (HOSPITAL_COMMUNITY)
Admission: EM | Admit: 2014-09-18 | Discharge: 2014-09-18 | Disposition: A | Discharge status: Home / Self Care | Payer: MEDICAID | Attending: Emergency Medicine | Admitting: Emergency Medicine

## 2014-09-18 ENCOUNTER — Emergency Department (HOSPITAL_COMMUNITY): Payer: MEDICAID

## 2014-09-18 DIAGNOSIS — M25571 Pain in right ankle and joints of right foot: Secondary | ICD-10-CM | POA: Insufficient documentation

## 2014-09-18 DIAGNOSIS — J449 Chronic obstructive pulmonary disease, unspecified: Secondary | ICD-10-CM | POA: Insufficient documentation

## 2014-09-18 DIAGNOSIS — I10 Essential (primary) hypertension: Secondary | ICD-10-CM | POA: Insufficient documentation

## 2014-09-18 DIAGNOSIS — Z79899 Other long term (current) drug therapy: Secondary | ICD-10-CM | POA: Insufficient documentation

## 2014-09-18 DIAGNOSIS — M25572 Pain in left ankle and joints of left foot: Secondary | ICD-10-CM

## 2014-09-18 DIAGNOSIS — D649 Anemia, unspecified: Secondary | ICD-10-CM | POA: Insufficient documentation

## 2014-09-18 DIAGNOSIS — M069 Rheumatoid arthritis, unspecified: Secondary | ICD-10-CM | POA: Insufficient documentation

## 2014-09-18 DIAGNOSIS — Z791 Long term (current) use of non-steroidal anti-inflammatories (NSAID): Secondary | ICD-10-CM | POA: Insufficient documentation

## 2014-09-18 DIAGNOSIS — Z720 Tobacco use: Secondary | ICD-10-CM | POA: Insufficient documentation

## 2014-09-18 DIAGNOSIS — Z7982 Long term (current) use of aspirin: Secondary | ICD-10-CM | POA: Insufficient documentation

## 2014-09-18 DIAGNOSIS — G8929 Other chronic pain: Secondary | ICD-10-CM | POA: Insufficient documentation

## 2014-09-18 MED ORDER — KETOROLAC TROMETHAMINE 60 MG/2ML IM SOLN
60.0000 mg | Freq: Once | INTRAMUSCULAR | Status: AC
  Administered 2014-09-18: 60 mg via INTRAMUSCULAR
  Filled 2014-09-18: qty 2

## 2014-09-18 MED ORDER — OXYCODONE-ACETAMINOPHEN 5-325 MG PO TABS
1.0000 | ORAL_TABLET | Freq: Once | ORAL | Status: AC
  Administered 2014-09-18: 1 via ORAL
  Filled 2014-09-18: qty 1

## 2014-09-18 NOTE — ED Provider Notes (Signed)
CSN: [id number]     Arrival date & time 09/18/14  1432 History   First MD Initiated Contact with Patient 09/18/14 1456     Chief Complaint  Patient presents with  . Joint Swelling     The history is provided by the patient. No language interpreter was used.   [name] presents for evaluation of joint swelling. She has a history of rheumatoid arthritis and is on chronic prednisone for the last 2 months due to not being able to afford her methotrexate or Plaquenil. She reports increased swelling and pain as well as stiffness in bilateral ankles for the last 4 days. She has pain with walking and considerable difficulty with walking. She also endorses increased swelling and pain in her knees. She does not feel significant swelling in her hands. She denies any fevers, chest pain, shortness of breath. She has follow-up with rheumatology on September 21. Symptoms are moderate, constant, worsening.  Past Medical History  Diagnosis Date  . Rheumatoid arthritis(714.0)   . Chronic anemia   . Hypertension   . Arthritis, rheumatoid   . Incidental lung nodule   . Depression   . Chronic pain   . Chronic back pain   . COPD (chronic obstructive pulmonary disease)    Past Surgical History  Procedure Laterality Date  . Tubal ligation    . Endometrial ablation    . Breast cyst excision  12/31/2010    Procedure: CYST EXCISION BREAST;  Surgeon: Fabio Bering, MD;  Location: [facility];  Service: General;  Laterality: Right;  Excision Sebaceous Cyst Right Breast   Family History  Problem Relation Age of Onset  . Anesthesia problems Neg Hx   . Cancer Father    Social History  Substance Use Topics  . Smoking status: Current Every Day Smoker -- 1.00 packs/day for 30 years    Types: Cigarettes  . Smokeless tobacco: Never Used  . Alcohol Use: No     Comment: occassional   OB History    Gravida Para Term Preterm AB TAB SAB Ectopic Multiple Living   6 5 5  1  1   5      Review of Systems  All other  systems reviewed and are negative.     Allergies  Codeine  Home Medications   Prior to Admission medications   Medication Sig Start Date End Date Taking? Authorizing Provider  albuterol (PROVENTIL HFA;VENTOLIN HFA) 108 (90 BASE) MCG/ACT inhaler Inhale 2 puffs into the lungs every 6 (six) hours as needed for wheezing or shortness of breath.    Historical Provider, MD  aspirin EC 325 MG tablet Take 325-650 mg by mouth 2 (two) times daily as needed for mild pain or moderate pain.    Historical Provider, MD  Calcium Carbonate-Vitamin D (CALCIUM 600 + D PO) Take 1 tablet by mouth every morning.     Historical Provider, MD  ferrous sulfate 325 (65 FE) MG tablet Take 325 mg by mouth daily. For anemia    Historical Provider, MD  folic acid (FOLVITE) 1 MG tablet Take 1 mg by mouth daily.      Historical Provider, MD  HYDROcodone-acetaminophen (NORCO/VICODIN) 5-325 MG per tablet Take 1 tablet by mouth every 4 (four) hours as needed. 08/26/14   08/28/14, PA-C  hydroxychloroquine (PLAQUENIL) 200 MG tablet Take 200 mg by mouth 3 (three) times daily.     Historical Provider, MD  methotrexate (RHEUMATREX) 2.5 MG tablet Take 12.5 mg by mouth 2 (two) times  a week. Monday and Tuesday. Caution:Chemotherapy. Protect from light.    Historical Provider, MD  Multiple Vitamin (MULTIVITAMIN WITH MINERALS) TABS Take 1 tablet by mouth daily.    Historical Provider, MD  nabumetone (RELAFEN) 500 MG tablet Take 1 tablet (500 mg total) by mouth 2 (two) times daily. 09/14/14   Elpidio Anis, PA-C  omeprazole (PRILOSEC) 20 MG capsule Take 1 capsule (20 mg total) by mouth daily. 08/10/14   Glynn Octave, MD  ondansetron (ZOFRAN) 4 MG tablet Take 1 tablet (4 mg total) by mouth every 6 (six) hours. 08/10/14   Glynn Octave, MD  predniSONE (DELTASONE) 10 MG tablet Take 20 mg by mouth 2 (two) times daily. 2 week course starting on 08/06/14    Historical Provider, MD  promethazine (PHENERGAN) 12.5 MG tablet Take 1 tablet (12.5 mg  total) by mouth every 6 (six) hours as needed for nausea or vomiting. Patient not taking: Reported on 04/04/2014 02/24/14   Janne Napoleon, NP   BP 139/94 mmHg  Pulse 77  Temp(Src) 98.5 F (36.9 C)  Resp 18  Ht 5\' 6"  (1.676 m)  Wt 160 lb (72.576 kg)  BMI 25.84 kg/m2  SpO2 100% Physical Exam  Constitutional: She is oriented to person, place, and time. She appears well-developed and well-nourished.  HENT:  Head: Normocephalic and atraumatic.  Cardiovascular: Normal rate and regular rhythm.   No murmur heard. Pulmonary/Chest: Effort normal and breath sounds normal. No respiratory distress.  Musculoskeletal:  Mild-to-moderate swelling of her bilateral ankles, medially and laterally. Mild to moderate tenderness over bilateral ankles with compression and with standing. There is no erythema or overlying rash. There is slightly decreased range of motion in bilateral ankles. 2+ DP pulses. There are no palpable effusions on bilateral knees.  Neurological: She is alert and oriented to person, place, and time.  Skin: Skin is warm and dry.  Psychiatric: She has a normal mood and affect. Her behavior is normal.  Nursing note and vitals reviewed.   ED Course  Procedures (including critical care time) Labs Review Labs Reviewed - No data to display  Imaging Review Dg Ankle Complete Left  09/18/2014   CLINICAL DATA:  Bilateral ankle pain  EXAM: LEFT ANKLE COMPLETE - 3+ VIEW  COMPARISON:  06/26/2013  FINDINGS: Three views of left ankle submitted. No acute fracture or subluxation. No radiopaque foreign body. Ankle mortise is preserved.  IMPRESSION: Negative.   Electronically Signed   By: 06/28/2013 M.D.   On: 09/18/2014 16:11   Dg Ankle Complete Right  09/18/2014   CLINICAL DATA:  Bilateral ankle pain and swelling, rheumatoid arthritis  EXAM: RIGHT ANKLE - COMPLETE 3+ VIEW  COMPARISON:  05/19/2014  FINDINGS: Three views of the right ankle submitted. No acute fracture or subluxation. Ankle mortise is  preserved. No radiopaque foreign body. There is plantar spurring of calcaneus. Degenerative changes talonavicular joint.  IMPRESSION: No acute fracture or subluxation. Plantar spurring of calcaneus. Degenerative changes talonavicular joint.   Electronically Signed   By: 07/19/2014 M.D.   On: 09/18/2014 16:10   I have personally reviewed and evaluated these images and lab results as part of my medical decision-making.   EKG Interpretation None      MDM   Final diagnoses:  Arthralgia of both ankles    Patient with history of RA here with increased pain and swelling in bilateral ankles. Examination is not consistent with septic arthritis or gout. Plain films obtained to evaluate for occult fracture given ongoing steroid  use. Plain films without evidence of fx. Discussed with patient home care for rheumatoid arthritis with rheumatology follow-up. Providing one-time dose of pain medicines in the emergency department. Discussed with patient that chronic pain meds are not appropriate at this time, especially from the emergency department setting.    Tilden Fossa, MD 09/18/14 [phone]

## 2014-09-18 NOTE — Discharge Instructions (Signed)
Continue your current medications as prescribed.   Rheumatoid Arthritis Rheumatoid arthritis is a long-term (chronic) inflammatory disease that causes pain, swelling, and stiffness of the joints. It can affect the entire body, including the eyes and lungs. The effects of rheumatoid arthritis vary widely among those with the condition. CAUSES  The cause of rheumatoid arthritis is not known. It tends to run in families and is more common in women. Certain cells of the body's natural defense system (immune system) do not work properly and begin to attack healthy joints. It primarily involves the connective tissue that lines the joints (synovial membrane). This can cause damage to the joint. SYMPTOMS   Pain, stiffness, swelling, and decreased motion of many joints, especially in the hands and feet.  Stiffness that is worse in the morning. It may last 1-2 hours or longer.  Numbness and tingling in the hands.  Fatigue.  Loss of appetite.  Weight loss.  Low-grade fever.  Dry eyes and mouth.  Firm lumps (rheumatoid nodules) that grow beneath the skin in areas such as the elbows and hands. DIAGNOSIS  Diagnosis is based on the symptoms described, an exam, and blood tests. Sometimes, X-rays are helpful. TREATMENT  The goals of treatment are to relieve pain, reduce inflammation, and to slow down or stop joint damage and disability. Methods vary and may include:  Maintaining a balance of rest, exercise, and proper nutrition.  Medicines:  Pain relievers (analgesics).  Corticosteroids and nonsteroidal anti-inflammatory drugs (NSAIDs) to reduce inflammation.  Disease-modifying antirheumatic drugs (DMARDs) to try to slow the course of the disease.  Biologic response modifiers to reduce inflammation and damage.  Physical therapy and occupational therapy.  Surgery for patients with severe joint damage. Joint replacement or fusing of joints may be needed.  Routine monitoring and ongoing  care, such as office visits, blood and urine tests, and X-rays. HOME CARE INSTRUCTIONS   Remain physically active and reduce activity when the disease gets worse.  Eat a well-balanced diet.  Put heat on affected joints when you wake up and before activities. Keep the heat on the affected joint for as long as directed by your health care provider.  Put ice on affected joints following activities or exercising.  Put ice in a plastic bag.  Place a towel between your skin and the bag.  Leave the ice on for 15-20 minutes, 3-4 times per day, or as directed by your health care provider.  Take medicines and supplements only as directed by your health care provider.  Use splints as directed by your health care provider. Splints help maintain joint position and function.  Do not sleep with pillows under your knees. This may lead to spasms.  Participate in a self-management program to keep current with the latest treatment and coping skills. SEEK IMMEDIATE MEDICAL CARE IF:  You have fainting episodes.  You have periods of extreme weakness.  You rapidly develop a hot, painful joint that is more severe than usual joint aches.  You have chills.  You have a fever. FOR MORE INFORMATION   American [employer] Rheumat[employer]: www.rheumatology.org  Arthritis Foundation: www.arthritis.org Document Released: 12/27/1999 Document Revised: 05/15/2013 Document Reviewed: 02/04/2011 [facility] Patient Information 2015 [place], [place]. This information is not intended to replace advice given to you by your health care provider. Make sure you discuss any questions you have with your health care provider.

## 2014-09-18 NOTE — ED Notes (Signed)
Pt returns follow up from Friday, Pt is not better, ankle swelling is worse, knee swelling, pt is on steroids for Rheumatoid Arthritis.

## 2014-09-29 ENCOUNTER — Emergency Department (HOSPITAL_COMMUNITY)
Admission: EM | Admit: 2014-09-29 | Discharge: 2014-09-29 | Disposition: A | Discharge status: Home / Self Care | Payer: No Typology Code available for payment source | Attending: Emergency Medicine | Admitting: Emergency Medicine

## 2014-09-29 ENCOUNTER — Encounter (HOSPITAL_COMMUNITY): Payer: Self-pay | Admitting: Emergency Medicine

## 2014-09-29 DIAGNOSIS — D649 Anemia, unspecified: Secondary | ICD-10-CM | POA: Insufficient documentation

## 2014-09-29 DIAGNOSIS — Z720 Tobacco use: Secondary | ICD-10-CM | POA: Diagnosis not present

## 2014-09-29 DIAGNOSIS — M069 Rheumatoid arthritis, unspecified: Secondary | ICD-10-CM | POA: Diagnosis not present

## 2014-09-29 DIAGNOSIS — Z7952 Long term (current) use of systemic steroids: Secondary | ICD-10-CM | POA: Insufficient documentation

## 2014-09-29 DIAGNOSIS — Z79899 Other long term (current) drug therapy: Secondary | ICD-10-CM | POA: Insufficient documentation

## 2014-09-29 DIAGNOSIS — J449 Chronic obstructive pulmonary disease, unspecified: Secondary | ICD-10-CM | POA: Insufficient documentation

## 2014-09-29 DIAGNOSIS — G8929 Other chronic pain: Secondary | ICD-10-CM | POA: Insufficient documentation

## 2014-09-29 DIAGNOSIS — Z7982 Long term (current) use of aspirin: Secondary | ICD-10-CM | POA: Insufficient documentation

## 2014-09-29 DIAGNOSIS — Y92410 Unspecified street and highway as the place of occurrence of the external cause: Secondary | ICD-10-CM | POA: Diagnosis not present

## 2014-09-29 DIAGNOSIS — S199XXA Unspecified injury of neck, initial encounter: Secondary | ICD-10-CM | POA: Diagnosis present

## 2014-09-29 DIAGNOSIS — Z8659 Personal history of other mental and behavioral disorders: Secondary | ICD-10-CM | POA: Insufficient documentation

## 2014-09-29 DIAGNOSIS — I10 Essential (primary) hypertension: Secondary | ICD-10-CM | POA: Insufficient documentation

## 2014-09-29 DIAGNOSIS — S46819A Strain of other muscles, fascia and tendons at shoulder and upper arm level, unspecified arm, initial encounter: Secondary | ICD-10-CM

## 2014-09-29 DIAGNOSIS — Y9389 Activity, other specified: Secondary | ICD-10-CM | POA: Diagnosis not present

## 2014-09-29 DIAGNOSIS — S3992XA Unspecified injury of lower back, initial encounter: Secondary | ICD-10-CM | POA: Insufficient documentation

## 2014-09-29 DIAGNOSIS — S46919A Strain of unspecified muscle, fascia and tendon at shoulder and upper arm level, unspecified arm, initial encounter: Secondary | ICD-10-CM | POA: Insufficient documentation

## 2014-09-29 DIAGNOSIS — Y998 Other external cause status: Secondary | ICD-10-CM | POA: Insufficient documentation

## 2014-09-29 MED ORDER — KETOROLAC TROMETHAMINE 60 MG/2ML IM SOLN
60.0000 mg | Freq: Once | INTRAMUSCULAR | Status: AC
  Administered 2014-09-29: 60 mg via INTRAMUSCULAR

## 2014-09-29 MED ORDER — KETOROLAC TROMETHAMINE 60 MG/2ML IM SOLN
INTRAMUSCULAR | Status: DC
Start: 2014-09-29 — End: 2014-09-29
  Filled 2014-09-29: qty 2
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
## 2014-10-12 IMAGING — CR DG ELBOW COMPLETE 3+V*R*
4 series · 4 of 4 positions shown · non-contrast
Comparison: Plain films right elbow 01/24/2012.

CLINICAL DATA: Status post fall. Right elbow pain.

EXAM:
RIGHT ELBOW - COMPLETE 3+ VIEW

[view not recorded (1 of 4)]
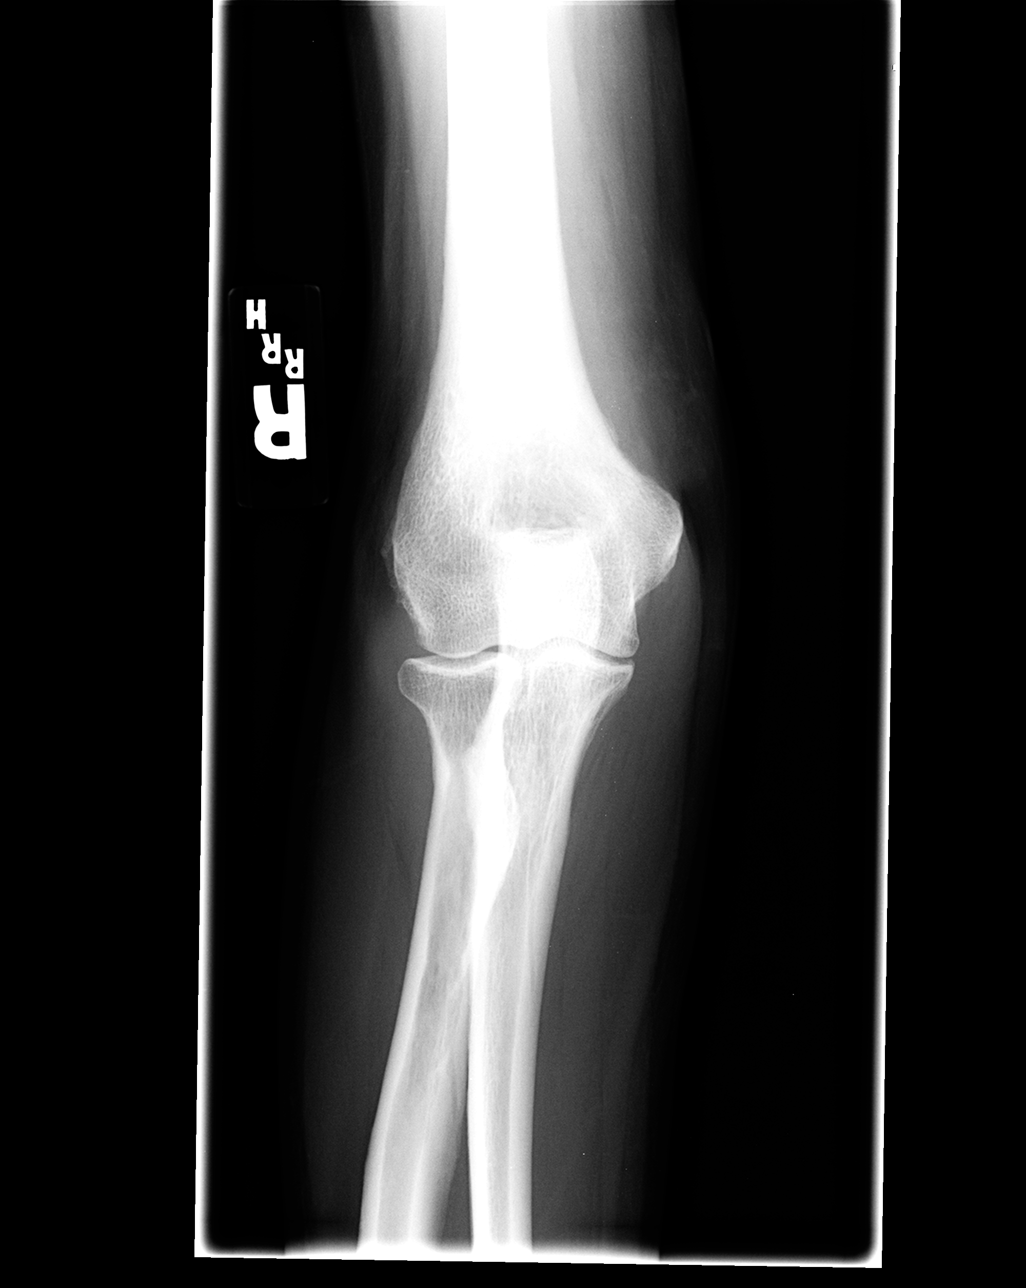

[view not recorded (2 of 4)]
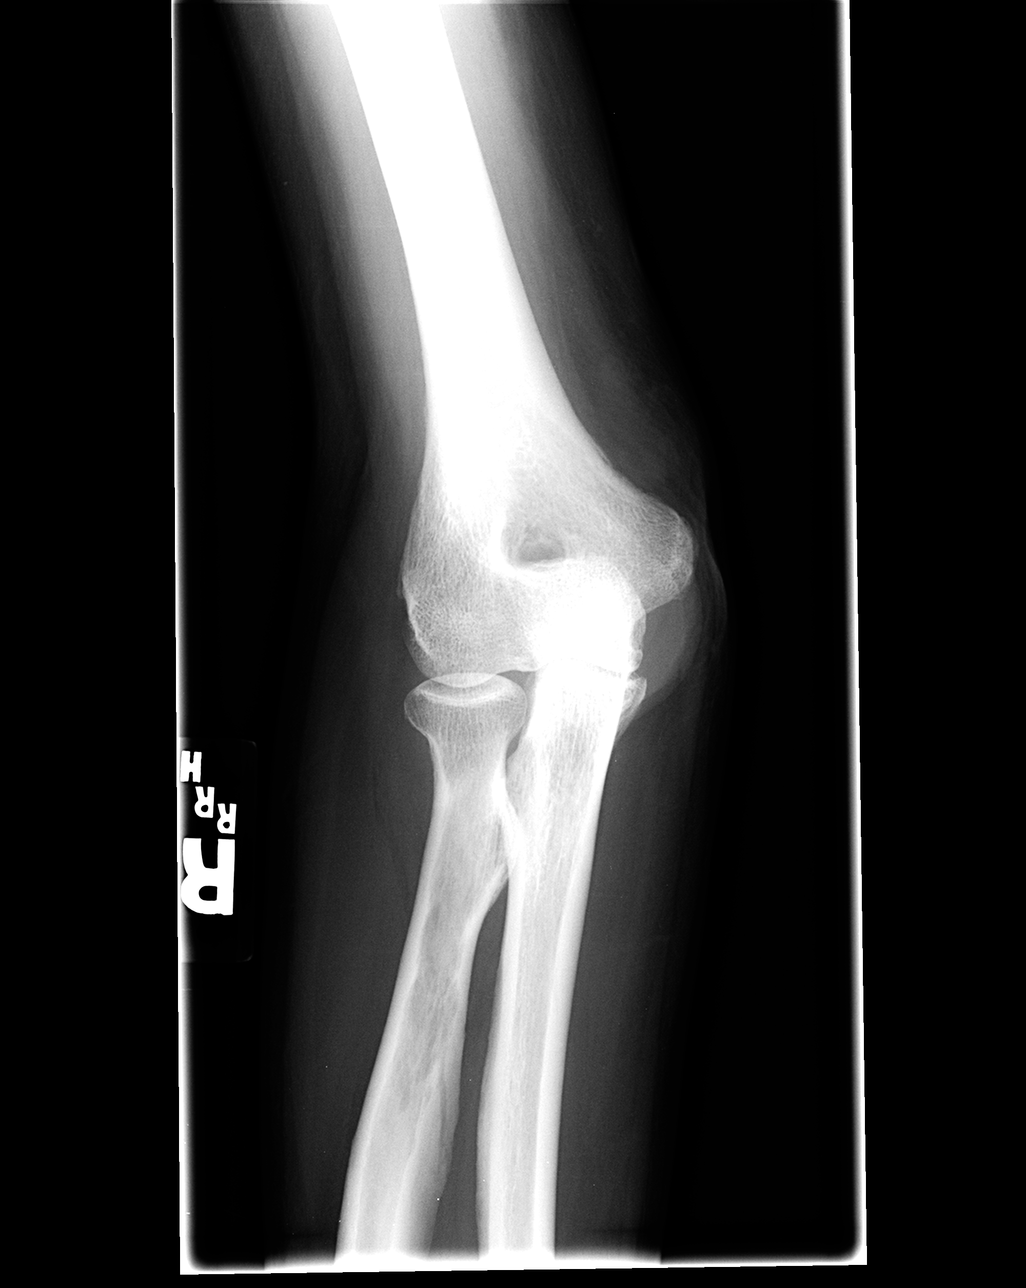

[view not recorded (3 of 4)]
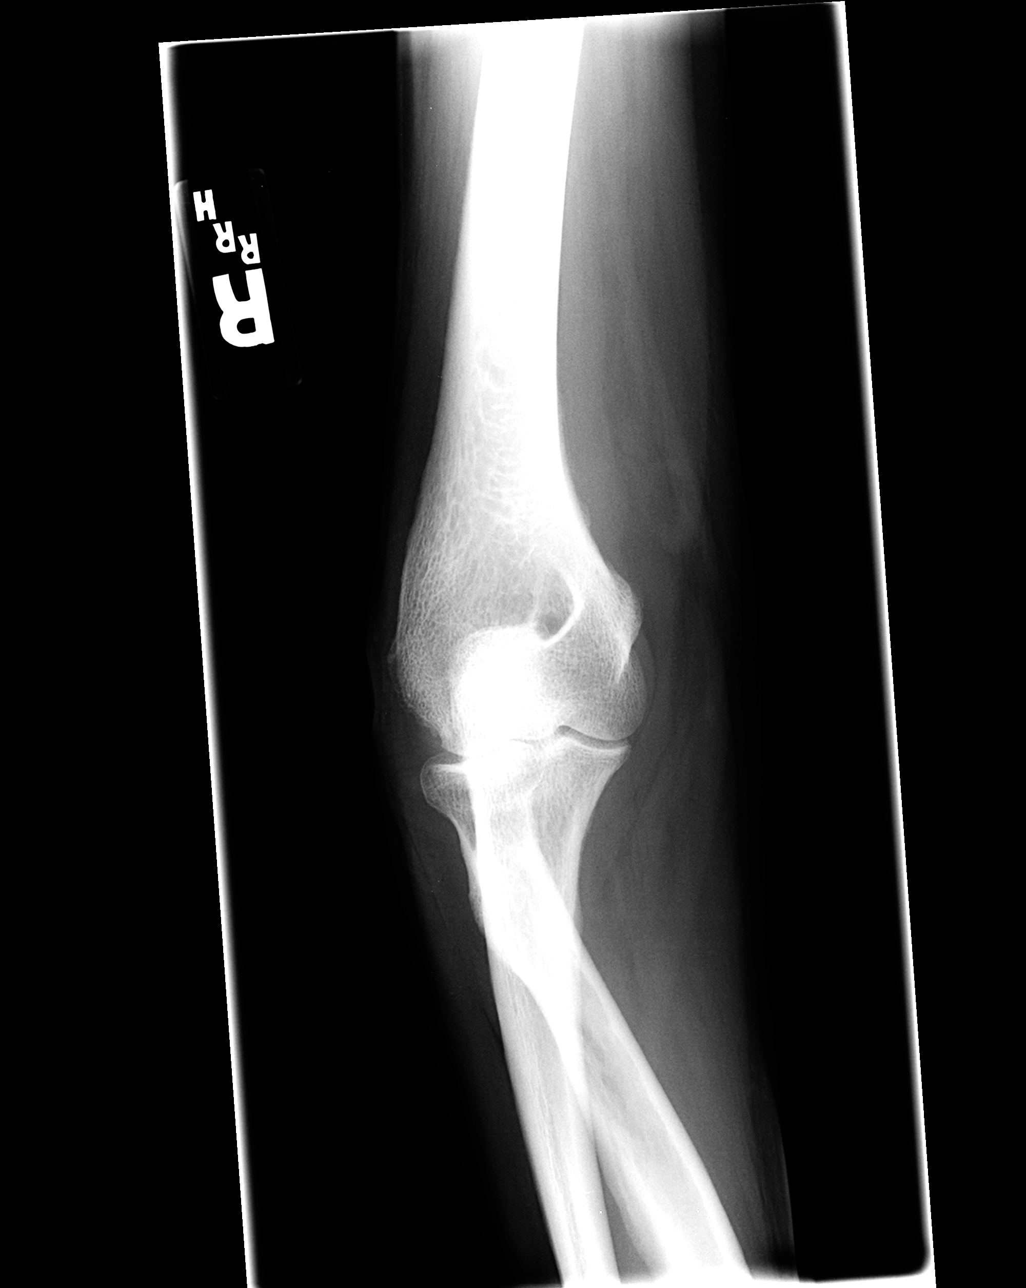

[view not recorded (4 of 4)]
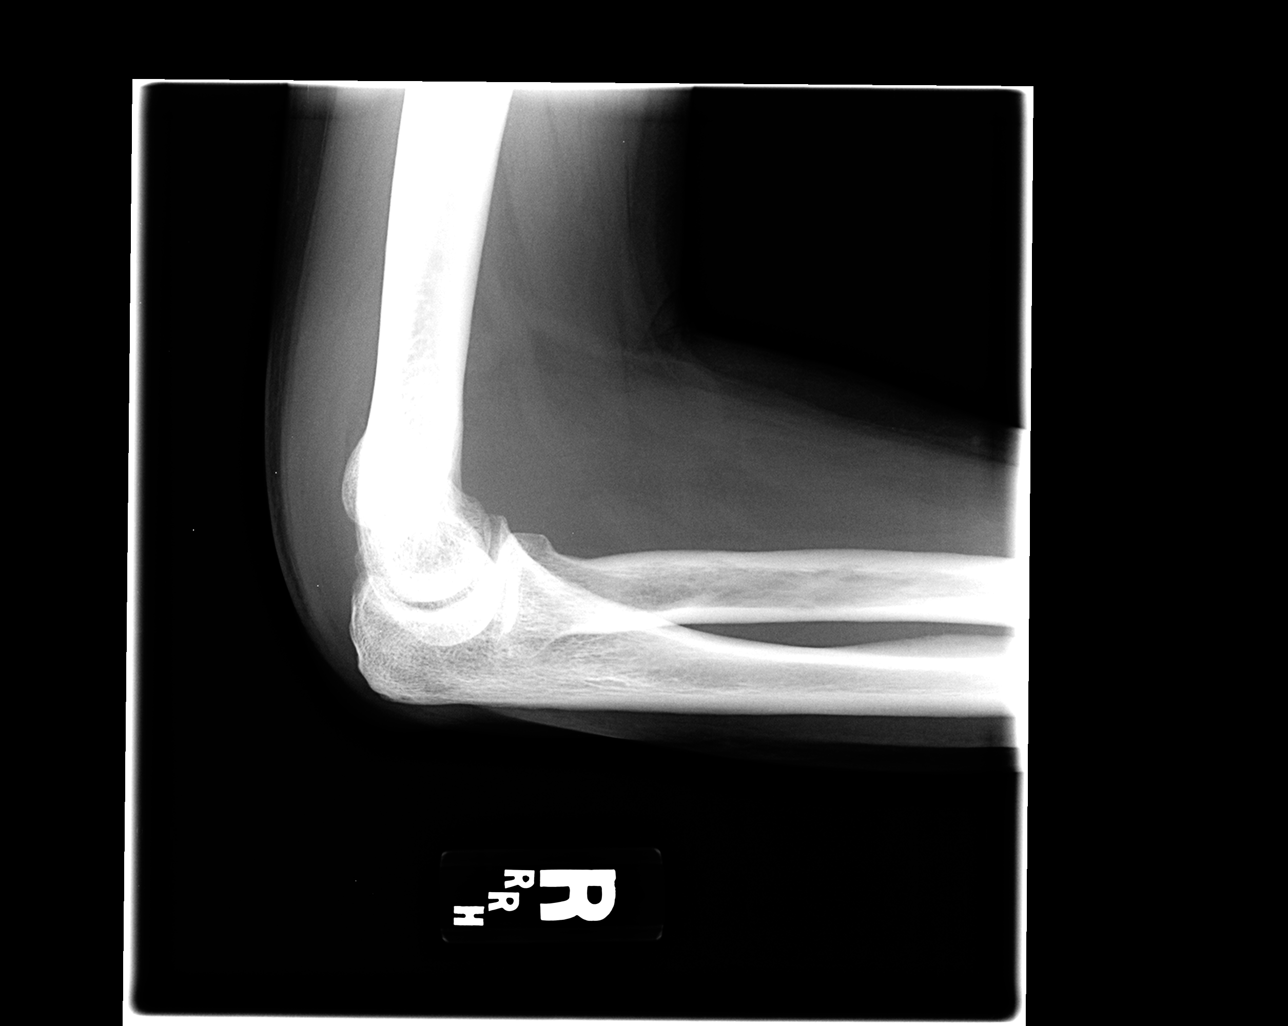

[4 of 4 positions shown; findings below may reference images not displayed]

FINDINGS: No fracture, dislocation or joint effusion is identified. There is
some soft tissue swelling posterior to the elbow.
IMPRESSION: Posterior soft tissue swelling without underlying acute bony or
joint abnormality.

## 2014-10-12 IMAGING — CR DG CERVICAL SPINE COMPLETE 4+V
6 series · 6 of 6 positions shown · non-contrast
Comparison: Plain film cervical spine 07/02/2012 and CT cervical
spine 01/24/2012.

CLINICAL DATA: Status post fall. Neck pain.

EXAM:
CERVICAL SPINE  4+ VIEWS

[view not recorded (1 of 6)]
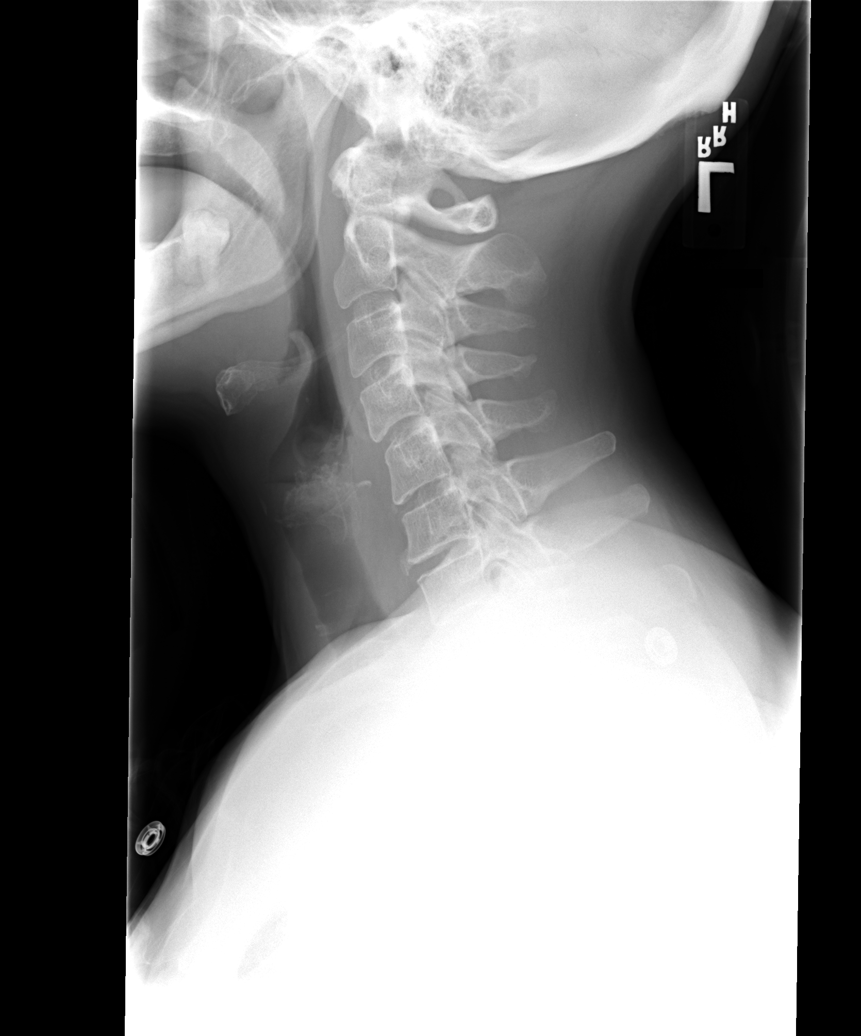

[view not recorded (2 of 6)]
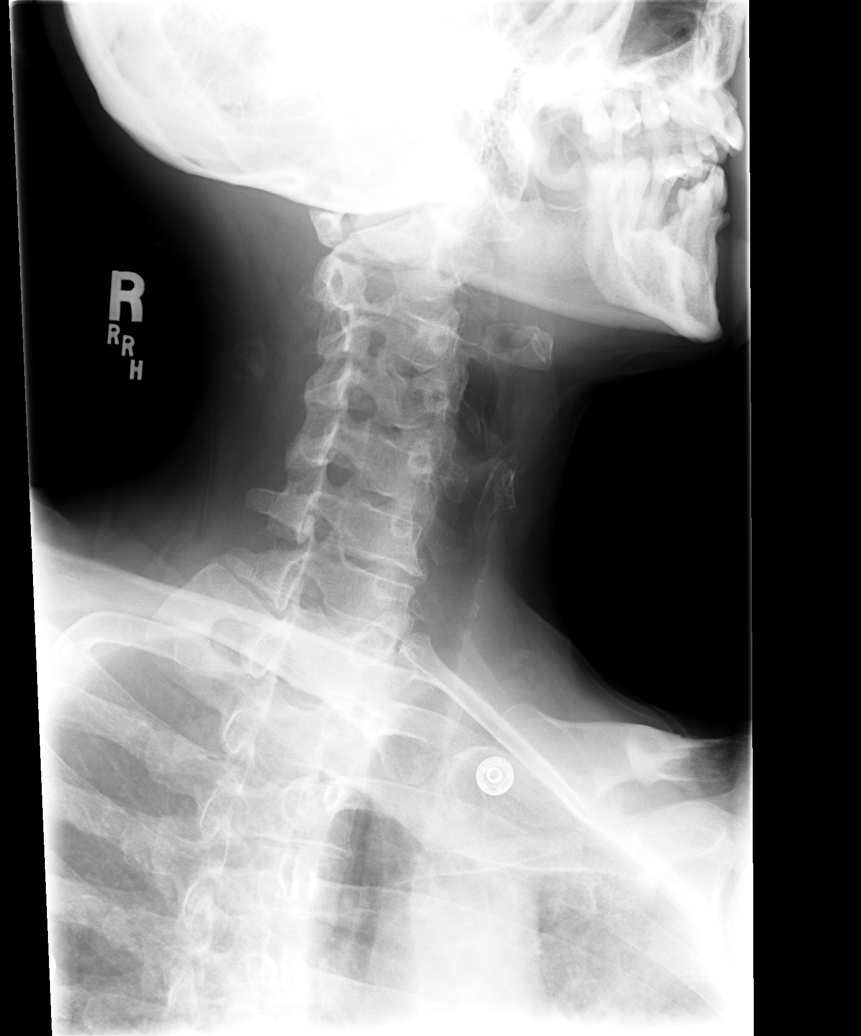

[view not recorded (3 of 6)]
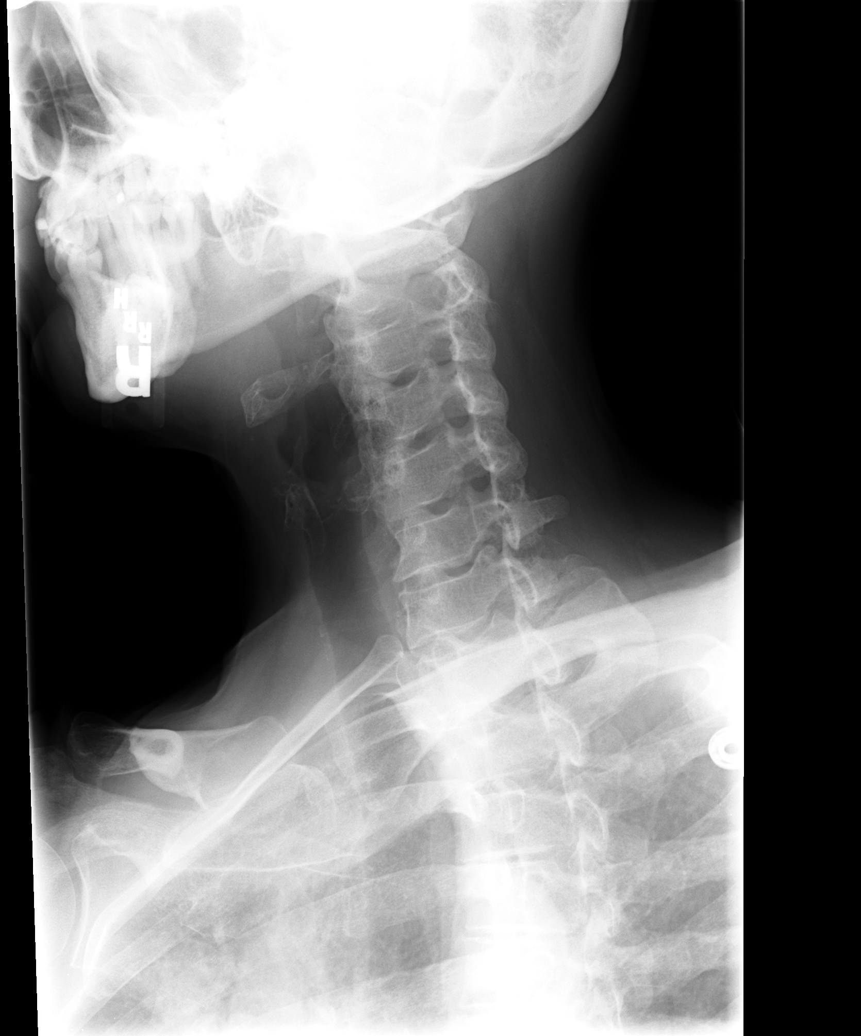

[view not recorded (4 of 6)]
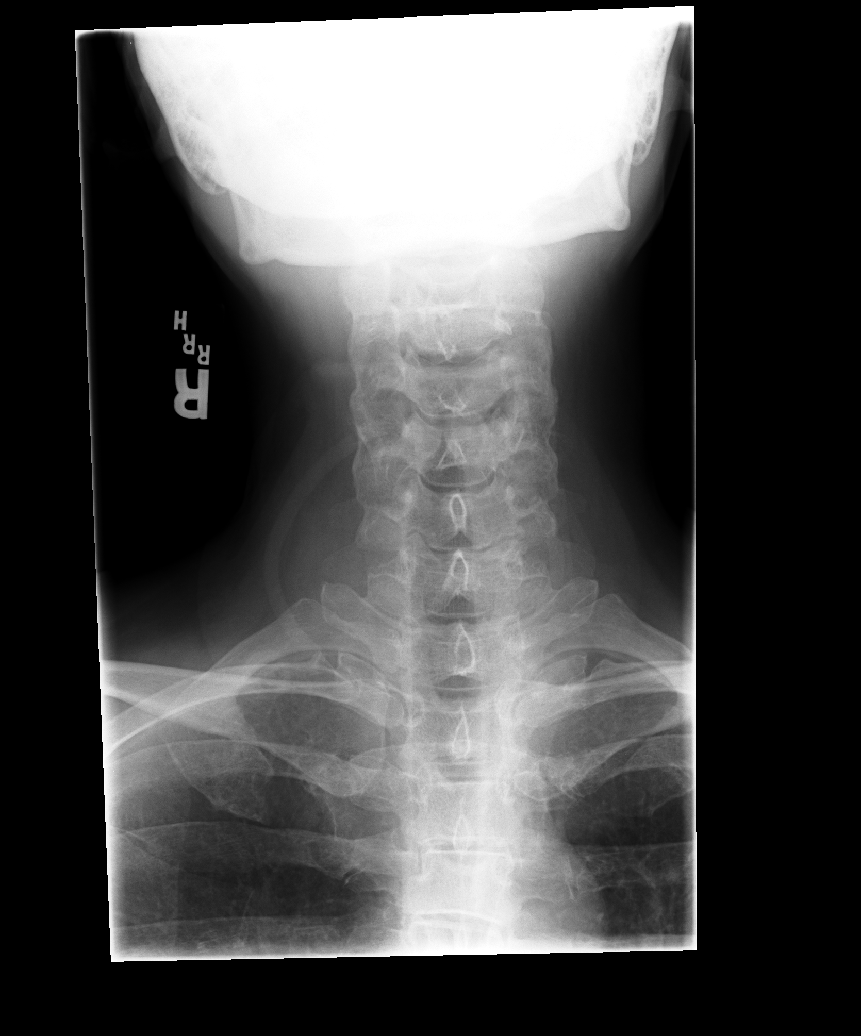

[view not recorded (5 of 6)]
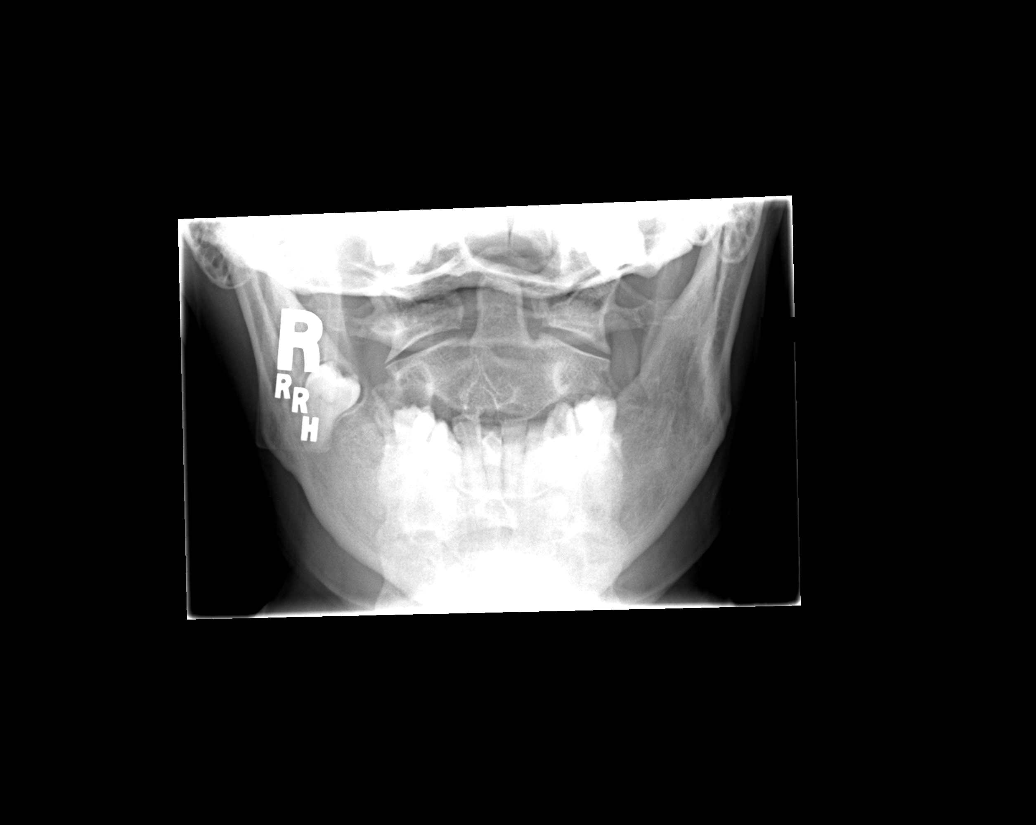

[view not recorded (6 of 6)]
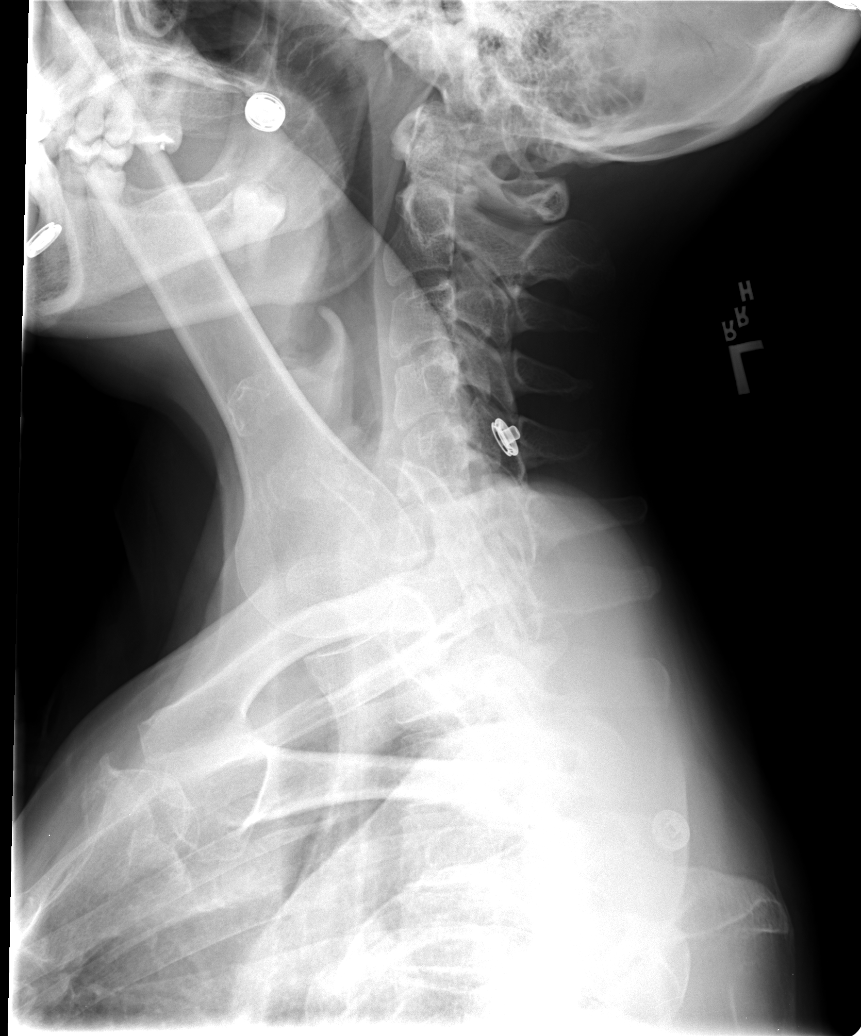

[6 of 6 positions shown; findings below may reference images not displayed]

FINDINGS: There is no evidence of cervical spine fracture or prevertebral soft
tissue swelling. Alignment is normal. No other significant bone
abnormalities are identified.
IMPRESSION: Negative cervical spine radiographs.

## 2014-10-22 ENCOUNTER — Other Ambulatory Visit: Payer: Self-pay | Admitting: Physician Assistant

## 2014-10-28 ENCOUNTER — Emergency Department (HOSPITAL_COMMUNITY)
Admission: EM | Admit: 2014-10-28 | Discharge: 2014-10-28 | Disposition: A | Discharge status: Home / Self Care | Payer: Self-pay | Attending: Emergency Medicine | Admitting: Emergency Medicine

## 2014-10-28 ENCOUNTER — Emergency Department (HOSPITAL_COMMUNITY): Payer: Self-pay

## 2014-10-28 ENCOUNTER — Encounter (HOSPITAL_COMMUNITY): Payer: Self-pay | Admitting: Emergency Medicine

## 2014-10-28 DIAGNOSIS — Z7951 Long term (current) use of inhaled steroids: Secondary | ICD-10-CM | POA: Insufficient documentation

## 2014-10-28 DIAGNOSIS — M546 Pain in thoracic spine: Secondary | ICD-10-CM | POA: Insufficient documentation

## 2014-10-28 DIAGNOSIS — J441 Chronic obstructive pulmonary disease with (acute) exacerbation: Secondary | ICD-10-CM | POA: Insufficient documentation

## 2014-10-28 DIAGNOSIS — J40 Bronchitis, not specified as acute or chronic: Secondary | ICD-10-CM

## 2014-10-28 DIAGNOSIS — D649 Anemia, unspecified: Secondary | ICD-10-CM | POA: Insufficient documentation

## 2014-10-28 DIAGNOSIS — J069 Acute upper respiratory infection, unspecified: Secondary | ICD-10-CM | POA: Insufficient documentation

## 2014-10-28 DIAGNOSIS — I10 Essential (primary) hypertension: Secondary | ICD-10-CM | POA: Insufficient documentation

## 2014-10-28 DIAGNOSIS — Z7952 Long term (current) use of systemic steroids: Secondary | ICD-10-CM | POA: Insufficient documentation

## 2014-10-28 DIAGNOSIS — Z720 Tobacco use: Secondary | ICD-10-CM | POA: Insufficient documentation

## 2014-10-28 DIAGNOSIS — Z79899 Other long term (current) drug therapy: Secondary | ICD-10-CM | POA: Insufficient documentation

## 2014-10-28 DIAGNOSIS — Z8659 Personal history of other mental and behavioral disorders: Secondary | ICD-10-CM | POA: Insufficient documentation

## 2014-10-28 DIAGNOSIS — M069 Rheumatoid arthritis, unspecified: Secondary | ICD-10-CM | POA: Insufficient documentation

## 2014-10-28 DIAGNOSIS — G8929 Other chronic pain: Secondary | ICD-10-CM | POA: Insufficient documentation

## 2014-10-28 DIAGNOSIS — M7918 Myalgia, other site: Secondary | ICD-10-CM

## 2014-10-28 LAB — I-STAT CHEM 8, ED
BUN: 13 mg/dL (ref 6–20)
CHLORIDE: 106 mmol/L (ref 101–111)
Calcium, Ion: 1.15 mmol/L (ref 1.12–1.23)
Creatinine, Ser: 0.7 mg/dL (ref 0.44–1.00)
Glucose, Bld: 91 mg/dL (ref 65–99)
HEMATOCRIT: 35 % — AB (ref 36.0–46.0)
HEMOGLOBIN: 11.9 g/dL — AB (ref 12.0–15.0)
POTASSIUM: 3.4 mmol/L — AB (ref 3.5–5.1)
SODIUM: 140 mmol/L (ref 135–145)
TCO2: 23 mmol/L (ref 0–100)

## 2014-10-28 LAB — I-STAT TROPONIN, ED: TROPONIN I, POC: 0 ng/mL (ref 0.00–0.08)

## 2014-10-28 LAB — I-STAT BETA HCG BLOOD, ED (MC, WL, AP ONLY)

## 2014-10-28 LAB — D-DIMER, QUANTITATIVE: D-Dimer, Quant: 1.86 ug/mL-FEU — ABNORMAL HIGH (ref 0.00–0.48)

## 2014-10-28 MED ORDER — IPRATROPIUM-ALBUTEROL 0.5-2.5 (3) MG/3ML IN SOLN
3.0000 mL | Freq: Once | RESPIRATORY_TRACT | Status: AC
  Administered 2014-10-28: 3 mL via RESPIRATORY_TRACT
  Filled 2014-10-28: qty 3

## 2014-10-28 MED ORDER — IOHEXOL 350 MG/ML SOLN
100.0000 mL | Freq: Once | INTRAVENOUS | Status: AC | PRN
  Administered 2014-10-28: 100 mL via INTRAVENOUS

## 2014-10-28 MED ORDER — ALBUTEROL SULFATE (2.5 MG/3ML) 0.083% IN NEBU
2.5000 mg | INHALATION_SOLUTION | Freq: Once | RESPIRATORY_TRACT | Status: AC
  Administered 2014-10-28: 2.5 mg via RESPIRATORY_TRACT
  Filled 2014-10-28: qty 3

## 2014-10-28 MED ORDER — ALBUTEROL SULFATE HFA 108 (90 BASE) MCG/ACT IN AERS
2.0000 | INHALATION_SPRAY | RESPIRATORY_TRACT | Status: AC
  Administered 2014-10-28: 2 via RESPIRATORY_TRACT
  Filled 2014-10-28: qty 6.7

## 2014-10-28 MED ORDER — BENZONATATE 100 MG PO CAPS: 100.0000 mg | ORAL_CAPSULE | Freq: Three times a day (TID) | ORAL | Status: DC | PRN

## 2014-10-28 MED ORDER — PREDNISONE 50 MG PO TABS: 60.0000 mg | ORAL_TABLET | Freq: Once | ORAL | Status: DC

## 2014-10-28 MED ORDER — OXYCODONE-ACETAMINOPHEN 5-325 MG PO TABS
1.0000 | ORAL_TABLET | Freq: Once | ORAL | Status: AC
  Administered 2014-10-28: 1 via ORAL
  Filled 2014-10-28: qty 1

## 2014-10-28 NOTE — ED Notes (Signed)
Patient c/o cough, chest congestion, and palpations. Per patient has had cold x1 week in which she thought was improving until yesterday. Patient reports cough is productive with thick greenish yellow sputum. Per patient sharp pain with breathing and coughing. Patient using inhalers and nebulizer treatments with no relief.

## 2014-10-28 NOTE — ED Provider Notes (Signed)
CSN: [id number]     Arrival date & time 10/28/14  1643 History   First MD Initiated Contact with Patient 10/28/14 1700     Chief Complaint  Patient presents with  . Cough  . Shortness of Breath  . Chest Pain  . Back Pain     HPI Pt was seen at 1715.  Per pt, c/o gradual onset and persistence of constant runny/stuffy nose, sinus congestion, and cough for the past 1 week. Pt states for the past 2 days she has developed "wheezing," right sided chest pain, and bilateral thoracic back "pain." Pt describes the pain as "sharp," worsens with coughing and deep breathing. Pt has been using her MDI and neb treatments without improvement. Pt has run out of her albuterol MDI. Describes the cough as productive of "yellow" sputum.  Denies fevers, no sore throat, no rash, no N/V/D, no abd pain.     Past Medical History  Diagnosis Date  . Rheumatoid arthritis(714.0)   . Chronic anemia   . Hypertension   . Arthritis, rheumatoid (HCC)   . Incidental lung nodule   . Depression   . Chronic pain   . COPD (chronic obstructive pulmonary disease) [facility])    Past Surgical History  Procedure Laterality Date  . Tubal ligation    . Endometrial ablation    . Breast cyst excision  12/31/2010    Procedure: CYST EXCISION BREAST;  Surgeon: Fabio Bering, MD;  Location: [facility];  Service: General;  Laterality: Right;  Excision Sebaceous Cyst Right Breast   Family History  Problem Relation Age of Onset  . Anesthesia problems Neg Hx   . Cancer Father    Social History  Substance Use Topics  . Smoking status: Current Every Day Smoker -- 1.00 packs/day for 30 years    Types: Cigarettes  . Smokeless tobacco: Never Used  . Alcohol Use: No   OB History    Gravida Para Term Preterm AB TAB SAB Ectopic Multiple Living   6 5 5  1  1   5      Review of Systems ROS: Statement: All systems negative except as marked or noted in the HPI; Constitutional: Negative for fever and chills. ; ; Eyes: Negative for eye pain,  redness and discharge. ; ; ENMT: Negative for ear pain, hoarseness, sore throat. +nasal congestion, sinus pressure and rhinorrhea. ; ; Cardiovascular: Negative for chest pain, palpitations, diaphoresis, and peripheral edema. ; ; Respiratory: +cough, wheezing, SOB, CP. Negative for stridor. ; ; Gastrointestinal: Negative for nausea, vomiting, diarrhea, abdominal pain, blood in stool, hematemesis, jaundice and rectal bleeding. . ; ; Genitourinary: Negative for dysuria, flank pain and hematuria. ; ; Musculoskeletal: +back pain. Negative for neck pain. Negative for swelling and trauma.; ; Skin: Negative for pruritus, rash, abrasions, blisters, bruising and skin lesion.; ; Neuro: Negative for headache, lightheadedness and neck stiffness. Negative for weakness, altered level of consciousness , altered mental status, extremity weakness, paresthesias, involuntary movement, seizure and syncope.      Allergies  Codeine  Home Medications   Prior to Admission medications   Medication Sig Start Date End Date Taking? Authorizing Provider  albuterol (PROVENTIL HFA;VENTOLIN HFA) 108 (90 BASE) MCG/ACT inhaler Inhale 2 puffs into the lungs every 6 (six) hours as needed for wheezing or shortness of breath.   Yes Historical Provider, MD  Calcium Carbonate-Vitamin D (CALCIUM 600 + D PO) Take 1 tablet by mouth every morning.    Yes Historical Provider, MD  ferrous sulfate 325 (  65 FE) MG tablet Take 325 mg by mouth daily. For anemia   Yes Historical Provider, MD  Fluticasone-Salmeterol (ADVAIR) 250-50 MCG/DOSE AEPB Inhale 1 puff into the lungs 2 (two) times daily.   Yes Historical Provider, MD  folic acid (FOLVITE) 1 MG tablet Take 1 mg by mouth daily.     Yes Historical Provider, MD  predniSONE (DELTASONE) 20 MG tablet Take 20 mg by mouth 2 (two) times daily. 09/30/14  Yes Historical Provider, MD  HYDROcodone-acetaminophen (NORCO/VICODIN) 5-325 MG per tablet Take 1 tablet by mouth every 4 (four) hours as needed. Patient  not taking: Reported on 10/28/2014 08/26/14   Burgess Amor, PA-C  hydroxychloroquine (PLAQUENIL) 200 MG tablet Take 200 mg by mouth 3 (three) times daily.     Historical Provider, MD  methotrexate (RHEUMATREX) 2.5 MG tablet Take 12.5 mg by mouth 2 (two) times a week. Monday and Tuesday. Caution:Chemotherapy. Protect from light.    Historical Provider, MD  Multiple Vitamin (MULTIVITAMIN WITH MINERALS) TABS Take 1 tablet by mouth daily.    Historical Provider, MD  nabumetone (RELAFEN) 500 MG tablet Take 1 tablet (500 mg total) by mouth 2 (two) times daily. Patient not taking: Reported on 10/28/2014 09/14/14   Elpidio Anis, PA-C  omeprazole (PRILOSEC) 20 MG capsule Take 1 capsule (20 mg total) by mouth daily. 08/10/14   Glynn Octave, MD  ondansetron (ZOFRAN) 4 MG tablet Take 1 tablet (4 mg total) by mouth every 6 (six) hours. Patient not taking: Reported on 10/28/2014 08/10/14   Glynn Octave, MD   BP 136/94 mmHg  Pulse 90  Temp(Src) 97.9 F (36.6 C) (Oral)  Resp 14  Wt 170 lb (77.111 kg)  SpO2 99% Physical Exam  1720: Physical examination:  Nursing notes reviewed; Vital signs and O2 SAT reviewed;  Constitutional: Well developed, Well nourished, Well hydrated, In no acute distress; Head:  Normocephalic, atraumatic; Eyes: EOMI, PERRL, No scleral icterus; ENMT: TM's clear bilat. +edemetous nasal turbinates bilat with clear rhinorrhea. Mouth and pharynx normal, Mucous membranes moist; Neck: Supple, Full range of motion, No lymphadenopathy; Cardiovascular: Regular rate and rhythm, No murmur, rub, or gallop; Respiratory: Breath sounds coarse & equal bilaterally, faint scattered wheezes.  Speaking full sentences with ease, Normal respiratory effort/excursion; Chest: Nontender, Movement normal; Abdomen: Soft, Nontender, Nondistended, Normal bowel sounds; Genitourinary: No CVA tenderness; Spine:  No midline CS, TS, LS tenderness. No rash.;;  Extremities: Pulses normal, No tenderness, No edema, No calf edema  or asymmetry.; Neuro: AA&Ox3, Major CN grossly intact.  Speech clear. No gross focal motor or sensory deficits in extremities.; Skin: Color normal, Warm, Dry.   ED Course  Procedures (including critical care time) Labs Review   Imaging Review  I have personally reviewed and evaluated these images and lab results as part of my medical decision-making.   EKG Interpretation   Date/Time:  Sunday October 28 2014 17:03:45 EDT Ventricular Rate:  84 PR Interval:  155 QRS Duration: 82 QT Interval:  342 QTC Calculation: 404 R Axis:   71 Text Interpretation:  Sinus rhythm Consider right atrial enlargement When  compared with ECG of 05/23/2014 No significant change was found Confirmed  by [facility]  MD, Nicholos Johns [phone]) on 10/28/2014 5:36:43 PM      MDM  MDM Reviewed: previous chart, nursing note and vitals Reviewed previous: labs and ECG Interpretation: labs, ECG and x-ray      Results for orders placed or performed during the hospital encounter of 10/28/14  D-dimer, quantitative  Result Value Ref Range  D-Dimer, Quant 1.86 (H) 0.00 - 0.48 ug/mL-FEU  I-Stat beta hCG blood, ED  Result Value Ref Range   I-stat hCG, quantitative <5.0 <5 mIU/mL   Comment 3          I-stat Chem 8, ED  Result Value Ref Range   Sodium 140 135 - 145 mmol/L   Potassium 3.4 (L) 3.5 - 5.1 mmol/L   Chloride 106 101 - 111 mmol/L   BUN 13 6 - 20 mg/dL   Creatinine, Ser 4.26 0.44 - 1.00 mg/dL   Glucose, Bld 91 65 - 99 mg/dL   Calcium, Ion 8.34 1.96 - 1.23 mmol/L   TCO2 23 0 - 100 mmol/L   Hemoglobin 11.9 (L) 12.0 - 15.0 g/dL   HCT 22.2 (L) 97.9 - 89.2 %  I-stat troponin, ED  Result Value Ref Range   Troponin i, poc 0.00 0.00 - 0.08 ng/mL   Comment 3           Dg Chest 2 View 10/28/2014  CLINICAL DATA:  Shortness of breath and fever for 2-3 days, worsening. History of COPD. EXAM: CHEST  2 VIEW COMPARISON:  PA and lateral chest 08/10/2014.  CT chest 05/23/2014. FINDINGS: The lungs are clear. Heart  size is normal. No pneumothorax or pleural effusion. No focal bony abnormality. IMPRESSION: No acute disease. Electronically Signed   By: Drusilla Kanner M.D.   On: 10/28/2014 17:36   Ct Angio Chest Pe W/cm &/or Wo Cm 10/28/2014  CLINICAL DATA:  Chest pain, shortness of breath, and elevated D-dimer. EXAM: CT ANGIOGRAPHY CHEST WITH CONTRAST TECHNIQUE: Multidetector CT imaging of the chest was performed using the standard protocol during bolus administration of intravenous contrast. Multiplanar CT image reconstructions and MIPs were obtained to evaluate the vascular anatomy. CONTRAST:  OMNIPAQUE IOHEXOL 350 MG/ML SOLN COMPARISON:  05/23/2014 FINDINGS: THORACIC INLET/BODY WALL: No acute abnormality. MEDIASTINUM: Normal heart size. No pericardial effusion. Age advanced coronary atherosclerosis with calcifications noted along the LAD and right coronary. No evidence of pulmonary embolism. Negative for aortic dissection. No adenopathy. LUNG WINDOWS: Subsegmental atelectasis at the bases. Mild bronchial wall thickening, chronic. There is no edema, consolidation, effusion, or pneumothorax. UPPER ABDOMEN: No acute findings. OSSEOUS: No acute fracture.  No suspicious lytic or blastic lesions. Review of the MIP images confirms the above findings. IMPRESSION: 1. No evidence of pulmonary embolism. 2. Bronchitis and subsegmental atelectasis. 3. Coronary atherosclerosis. Electronically Signed   By: Marnee Spring M.D.   On: 10/28/2014 20:29    2100:  Pt feels better after neb and wants to go home now. Sats 100% R/A, resps easy, lungs CTA bilat, NAD. Pt already taking prednisone 40mg  PO qdaily. States she needs another albuterol MDI because she has run out. Tx symptomatically at this time.  Dx and testing d/w pt.  Questions answered.  Verb understanding, agreeable to d/c home with outpt f/u.   , DO 10/31/14 2233

## 2014-10-28 NOTE — Discharge Instructions (Signed)
°Emergency Department Resource Guide °1) Find a Doctor and Pay Out of Pocket °Although you won't have to find out who is covered by your insurance plan, it is a good idea to ask around and get recommendations. You will then need to call the office and see if the doctor you have chosen will accept you as a new patient and what types of options they offer for patients who are self-pay. Some doctors offer discounts or will set up payment plans for their patients who do not have insurance, but you will need to ask so you aren't surprised when you get to your appointment. ° °2) Contact Your Local Health Department °Not all health departments have doctors that can see patients for sick visits, but many do, so it is worth a call to see if yours does. If you don't know where your local health department is, you can check in your phone book. The CDC also has a tool to help you locate your state's health department, and many state websites also have listings of all of their local health departments. ° °3) Find a Walk-in Clinic °If your illness is not likely to be very severe or complicated, you may want to try a walk in clinic. These are popping up all over the country in pharmacies, drugstores, and shopping centers. They're usually staffed by nurse practitioners or physician assistants that have been trained to treat common illnesses and complaints. They're usually fairly quick and inexpensive. However, if you have serious medical issues or chronic medical problems, these are probably not your best option. ° °No Primary Care Doctor: °- Call [facility] at  832-8000 - they can help you locate a primary care doctor that  accepts your insurance, provides certain services, etc. °- Physician Referral Service- 1-800-533-3463 ° °Chronic Pain Problems: °Organization         Address  Phone   Notes  °[facility]  ([phone]) [phone] Patients need to be referred by their primary care doctor.  ° °Medication  Assistance: °Organization         Address  Phone   Notes  °[place] [facility] [address] °[place], [place] [address] (336) 641-8030 --Must be a resident of [place] °-- Must have NO insurance coverage whatsoever (no Medicaid/ Medicare, etc.) °-- The pt. MUST have a primary care doctor that directs their care regularly and follows them in the community °  °[facility]  (866) 331-1348   °[employer]  (888) 892-1162   ° °Agencies that provide inexpensive medical care: °Organization         Address  Phone   Notes  °[facility]  (336) 832-8035   °[facility]    (336) 832-7272   °[facility] [address] °[place], [place] [address] (336) 832-4777   °[facility] [address], °[place] (336) 271-4999   °[facility]    (336) 373-0678   °[facility]    (336) 272-1050   °[facility] ° [address], [place] Phone:  (336) 832-4444, Fax:  (336) 832-4440 Hours of Operation:  9 am - 6 pm, M-F.  Also accepts Medicaid/Medicare and self-pay.  °[facility] ° [address], [place] Phone: (336) [phone], Fax: (336) [phone]. Hours of Operation:  8:30 am - 5:30 pm, M-F.  Also accepts Medicaid and self-pay.  °[facility] [place] [address], [place] Phone: (336) 878-6027   °[facility] [address], [place], [place] (336)723-1848, Ext. 123 Mondays & Thursdays: 7-9 AM.  First 15 patients are seen on a first come, first serve basis. °  ° °Medicaid-accepting [place] Providers: ° °Organization         Address  Phone   Notes  °[facility] [address], [place] (336) 641-2100 Also accepts self-pay patients.  °[facility] [address], [place] ° (336) 856-9996   °[facility] [address], [place]  (336) 288-8857   °[facility] [address] [place] [address], [place] (336) 299-7000   °Veita Bland [address], [place]  ° (336) [phone] Only accepts [place] Access Medicaid patients after they have their name applied to their card.  ° °Self-Pay (no insurance) in [place]: ° °Organization         Address  Phone   Notes  °Sickle Cell Patients, [facility] [address], [place] (336) 832-1970   °[facility] [address], [place] (336) 832-4400   °[facility] ° [address], [place] (336) 992-4800   °[facility]/Dr. Osei-Bonsu ° [address] [place] [address], [place] or [address], [place] (336) [phone] Phone number for both [place] and [place] locations is the same.  °[facility] [address], [place] (336) 299-0000   °[facility] [address] [place] [address], [place] or [address] (336) 852-7530 °(336) 878-2260   °[facility] [address], [place] (336) 350-1642, phone; (336) 294-5005, fax Sees patients 1st and 3rd Saturday of every month.  Must not qualify for public or private insurance (i.e. Medicaid, Medicare, [place] Health Choice, Veterans' Benefits) • Household income should be no more than 200% of the poverty level •The clinic cannot treat you if you are pregnant or think you are pregnant • Sexually transmitted diseases are not treated at the clinic.  ° ° °Dental Care: °Organization         Address  Phone  Notes  °[place] [facility] [address], [place] (336) 641-6152 Accepts children up to age 21 who are enrolled in Medicaid or [place] Health Choice; pregnant women with a Medicaid card; and children who have applied for Medicaid or [place] Health Choice, but were declined, whose parents can pay a reduced fee at time of service.  °[place]  [facility] [place]  [address], [place] (336) 641-7733 Accepts children up to age 21 who are enrolled in Medicaid or [place] Health Choice; pregnant women with a Medicaid card; and children who have applied for Medicaid or [place] Health Choice, but were declined, whose parents can pay a reduced fee at time of service.  °[facility] ° [address], [place] (336) 641-4533 Patients are seen by appointment only. Walk-ins are not accepted. [facility] will see patients 18 years of age and older. °Monday - Tuesday (8am-5pm) °Most Wednesdays (8:30-5pm) °$30 per visit, cash only  °[facility] ° [address], [place] (336) [phone] Patients are seen by appointment only. Walk-ins are not accepted. [facility] will see patients 18 years of age and older. °One   Wednesday Evening (Monthly: Volunteer Based).  $30 per visit, cash only  °[facility]  (919) [phone] for adults; Children under age 4, call Graduate Pediatric Dentistry at ([phone]) [phone]. Children aged 4-14, please call ([phone]) [phone] to request a pediatric application. ° Dental services are provided in all areas of dental care including fillings, crowns and bridges, complete and partial dentures, implants, gum treatment, root canals, and extractions. Preventive care is also provided. Treatment is provided to both adults and children. °Patients are selected via a lottery and there is often a waiting list. °  °[facility] [address], °[place] ° (336) 763-8833 [email or web address] °  °[facility] [address], [place], [place] (336)723-1848, Ext. 123 Second and Fourth Thursday of each month, opens at 6:30 AM; Clinic ends at 9 AM.  Patients are seen on a first-come first-served basis, and a limited number are seen during each clinic.  ° °[facility] ° [address], [place], [place] (336) [phone]    Eligibility Requirements °You must have lived in [place], [place], or [place] counties for at least the last three months. °  You cannot be eligible for state or federal sponsored healthcare insurance, including Veterans Administration, Medicaid, or Medicare. °  You generally cannot be eligible for healthcare insurance through your employer.  °  How to apply: °Eligibility screenings are held every Tuesday and Wednesday afternoon from 1:00 pm until 4:00 pm. You do not need an appointment for the interview!  °[facility] [address], [place], [place] 336-631-2330   °[facility]  336-342-8273   °[facility]  336-703-3100   °[facility]  336-570-6415   ° °Behavioral Health Resources in the Community: °Intensive Outpatient Programs °Organization         Address  Phone  Notes  °[facility] [address], [place], [place] 336-878-6098   °[facility] [address], [place], [place] 336-832-9800   °[facility] [address], [place], [place] ° 336-882-2125   °[facility] [address],  °[place], [place] 1-800-853-5163 or 336-641-4981   °Substance Abuse Resources °Organization         Address  Phone  Notes  °[facility]  336-882-2125   °[facility]  336-784-9470   °[facility]  336-285-9073   °[facility]  336-845-3988   °Residential & Outpatient Substance Abuse Program  1-800-659-3381   °Psychological Services °Organization         Address  Phone  Notes  °[facility]  336- 832-9600   °[facility]  336- 378-7881   °[facility] [address], [place] 1-800-853-5163 or 336-641-4981   ° °Mobile Crisis Teams °Organization         Address  Phone  Notes  °[facility], Mobile Crisis Care Unit  1-877-626-1772   °Assertive °[facility] ° [address]  [place], [place] 336-834-9664   °Sharon DeEsch [address] °[place] 336-554-5454   ° °Self-Help/Support Groups °Organization         Address  Phone             Notes  °[facility]  - variety of support groups  336- 373-1402 Call for more information  °Narcotics Anonymous (NA), [facility] [address], °[place]  2 meetings at this location  ° °  Residential Treatment Programs Organization         Address  Phone  Notes  [facility] [address]    [place]  1-610-960-4540   [facility]  [address], [place] [address], [place], [place] [phone]   [facility] [address] [place], [place] 086-578-4696 Admissions: 8am-3pm M-F  [facility] [address],    [place], [place] 295-284-1324   [facility] [address] [place], [place], [place] 401-027-2536   [facility] [address],  [place], [place] 644-034-7425   [facility] Intensive Outpatient [address], [place], [place] 956-387-5643   [facility] ([facility] [address] [place]  [place], [place] 3-295-188-4166 or 8148487074   [facility] [address] [place], [place] 323-557-3220 Accepts Medicaid  [facility] [place] [address],  [place] 2-542-706-2376 Substance Abuse/Addiction Treatment   [facility]         Address  Phone  Notes  [facility]  334 198 5187   Angie Fava, PhD [address] [place], [place]   639-647-2766 or (205)357-3122   [facility]   [address] [place], [place] (865) 845-6318   [facility] [address] [place], [place] 248-429-2417 Insurance/Medicaid/sponsorship through [facility] [address]                                    [place], [place] ([phone]) [phone] Therapy/tele-psych/case    [facility] [address], [place] [phone]    Dr. Lolly Mustache  [phone]   [facility]  [employer] [facility] Dept. 1) [address] [place] 2) [address], [place] 3)  [address], [place] [phone] 563-322-5886  726-572-3503   [facility] Child Abuse Hotline (319)092-2061 or 984-077-0508 (After Hours)      Take the prescription as directed.  Use your albuterol inhaler (2 to 4 puffs) every 4 hours for the next 7 days, then as needed for cough, wheezing, or shortness of breath. Take over the counter decongestant (such as sudafed), as directed on packaging, for the next week.  Use over the counter normal saline nasal spray, as instructed in the Emergency Department, several times per day for the next 2 weeks. Apply moist heat or ice to the area(s) of discomfort, for 15 minutes at a time, several times per day for the next few days.  Do not fall asleep on a heating or ice pack. Call your regular medical doctor tomorrow morning to schedule a follow up appointment within the next 2 days.  Return to the Emergency Department immediately sooner if worsening.

## 2014-11-14 ENCOUNTER — Ambulatory Visit: Payer: Self-pay | Admitting: Physician Assistant

## 2014-11-14 VITALS — BP 166/110 | HR 83 | Temp 97.8°F | Ht 66.0 in | Wt 165.4 lb

## 2014-11-14 DIAGNOSIS — E785 Hyperlipidemia, unspecified: Secondary | ICD-10-CM

## 2014-11-14 DIAGNOSIS — F17210 Nicotine dependence, cigarettes, uncomplicated: Secondary | ICD-10-CM | POA: Insufficient documentation

## 2014-11-14 DIAGNOSIS — M069 Rheumatoid arthritis, unspecified: Secondary | ICD-10-CM

## 2014-11-14 DIAGNOSIS — E0590 Thyrotoxicosis, unspecified without thyrotoxic crisis or storm: Secondary | ICD-10-CM

## 2014-11-14 MED ORDER — PREDNISONE 20 MG PO TABS: 20.0000 mg | ORAL_TABLET | Freq: Every day | ORAL | Status: DC

## 2014-11-14 NOTE — Patient Instructions (Signed)
Smoking Cessation, Tips for Success If you are ready to quit smoking, congratulations! You have chosen to help yourself be healthier. Cigarettes bring nicotine, tar, carbon monoxide, and other irritants into your body. Your lungs, heart, and blood vessels will be able to work better without these poisons. There are many different ways to quit smoking. Nicotine gum, nicotine patches, a nicotine inhaler, or nicotine nasal spray can help with physical craving. Hypnosis, support groups, and medicines help break the habit of smoking. WHAT THINGS CAN I DO TO MAKE QUITTING EASIER?  Here are some tips to help you quit for good:  Pick a date when you will quit smoking completely. Tell all of your friends and family about your plan to quit on that date.  Do not try to slowly cut down on the number of cigarettes you are smoking. Pick a quit date and quit smoking completely starting on that day.  Throw away all cigarettes.   Clean and remove all ashtrays from your home, work, and car.  On a card, write down your reasons for quitting. Carry the card with you and read it when you get the urge to smoke.  Cleanse your body of nicotine. Drink enough water and fluids to keep your urine clear or pale yellow. Do this after quitting to flush the nicotine from your body.  Learn to predict your moods. Do not let a bad situation be your excuse to have a cigarette. Some situations in your life might tempt you into wanting a cigarette.  Never have "just one" cigarette. It leads to wanting another and another. Remind yourself of your decision to quit.  Change habits associated with smoking. If you smoked while driving or when feeling stressed, try other activities to replace smoking. Stand up when drinking your coffee. Brush your teeth after eating. Sit in a different chair when you read the paper. Avoid alcohol while trying to quit, and try to drink fewer caffeinated beverages. Alcohol and caffeine may urge you to  smoke.  Avoid foods and drinks that can trigger a desire to smoke, such as sugary or spicy foods and alcohol.  Ask people who smoke not to smoke around you.  Have something planned to do right after eating or having a cup of coffee. For example, plan to take a walk or exercise.  Try a relaxation exercise to calm you down and decrease your stress. Remember, you may be tense and nervous for the first 2 weeks after you quit, but this will pass.  Find new activities to keep your hands busy. Play with a pen, coin, or rubber band. Doodle or draw things on paper.  Brush your teeth right after eating. This will help cut down on the craving for the taste of tobacco after meals. You can also try mouthwash.   Use oral substitutes in place of cigarettes. Try using lemon drops, carrots, cinnamon sticks, or chewing gum. Keep them handy so they are available when you have the urge to smoke.  When you have the urge to smoke, try deep breathing.  Designate your home as a nonsmoking area.  If you are a heavy smoker, ask your health care provider about a prescription for nicotine chewing gum. It can ease your withdrawal from nicotine.  Reward yourself. Set aside the cigarette money you save and buy yourself something nice.  Look for support from others. Join a support group or smoking cessation program. Ask someone at home or at work to help you with your plan   to quit smoking.  Always ask yourself, "Do I need this cigarette or is this just a reflex?" Tell yourself, "Today, I choose not to smoke," or "I do not want to smoke." You are reminding yourself of your decision to quit.  Do not replace cigarette smoking with electronic cigarettes (commonly called e-cigarettes). The safety of e-cigarettes is unknown, and some may contain harmful chemicals.  If you relapse, do not give up! Plan ahead and think about what you will do the next time you get the urge to smoke. HOW WILL I FEEL WHEN I QUIT SMOKING? You  may have symptoms of withdrawal because your body is used to nicotine (the addictive substance in cigarettes). You may crave cigarettes, be irritable, feel very hungry, cough often, get headaches, or have difficulty concentrating. The withdrawal symptoms are only temporary. They are strongest when you first quit but will go away within 10-14 days. When withdrawal symptoms occur, stay in control. Think about your reasons for quitting. Remind yourself that these are signs that your body is healing and getting used to being without cigarettes. Remember that withdrawal symptoms are easier to treat than the major diseases that smoking can cause.  Even after the withdrawal is over, expect periodic urges to smoke. However, these cravings are generally short lived and will go away whether you smoke or not. Do not smoke! WHAT RESOURCES ARE AVAILABLE TO HELP ME QUIT SMOKING? Your health care provider can direct you to community resources or hospitals for support, which may include:  Group support.  Education.  Hypnosis.  Therapy.   This information is not intended to replace advice given to you by your health care provider. Make sure you discuss any questions you have with your health care provider.   Document Released: 09/27/2003 Document Revised: 01/19/2014 Document Reviewed: 06/16/2012 Elsevier Interactive Patient Education 2016 [employer]  

## 2014-11-14 NOTE — Progress Notes (Signed)
BP 166/110 mmHg  Pulse 83  Temp(Src) 97.8 F (36.6 C)  Ht 5\' 6"  (1.676 m)  Wt 165 lb 6.4 oz (75.025 kg)  BMI 26.71 kg/m2  SpO2 97%   Subjective:    Patient ID: , female    DOB: 12/28/1965, 49 y.o.   MRN: [id number]  HPI: [name] is a 49 y.o. female presenting on 11/14/2014 for No chief complaint on file.   HPI  Chief Complaint  Patient presents with  . Rheumatoid Arthritis    has appt dec 7 at [facility]  . Hyperthyroidism  . GI Bleeding    pt states no more episodes of bleeding     Pt not yet approved for cone disc. She turned in more papers today for that process.  Relevant past medical, surgical, family and social history reviewed and updated as indicated. Interim medical history since our last visit reviewed. Allergies and medications reviewed and updated.   Current outpatient prescriptions:  .  albuterol (PROVENTIL HFA;VENTOLIN HFA) 108 (90 BASE) MCG/ACT inhaler, Inhale 2 puffs into the lungs every 6 (six) hours as needed for wheezing or shortness of breath., Disp: , Rfl:  .  lovastatin (MEVACOR) 20 MG tablet, Take 20 mg by mouth at bedtime., Disp: , Rfl:  .  Multiple Vitamin (MULTIVITAMIN WITH MINERALS) TABS, Take 1 tablet by mouth daily., Disp: , Rfl:  .  predniSONE (DELTASONE) 20 MG tablet, Take 20 mg by mouth 2 (two) times daily., Disp: , Rfl: 0   Review of Systems  Constitutional: Positive for diaphoresis, appetite change, fatigue and unexpected weight change. Negative for fever and chills.  HENT: Positive for congestion and dental problem. Negative for drooling, ear pain, facial swelling, hearing loss, mouth sores, sneezing, sore throat, trouble swallowing and voice change.   Eyes: Negative for pain, discharge, redness, itching and visual disturbance.  Respiratory: Positive for cough, shortness of breath and wheezing. Negative for choking.   Cardiovascular: Positive for chest pain and leg swelling. Negative for palpitations.   Gastrointestinal: Positive for abdominal pain. Negative for vomiting, diarrhea, constipation and blood in stool.  Endocrine: Positive for polydipsia. Negative for cold intolerance and heat intolerance.  Genitourinary: Negative for dysuria, hematuria and decreased urine volume.  Musculoskeletal: Positive for arthralgias and gait problem. Negative for back pain.  Skin: Negative for rash.  Allergic/Immunologic: Negative for environmental allergies.  Neurological: Positive for headaches. Negative for seizures, syncope and light-headedness.  Hematological: Negative for adenopathy.  Psychiatric/Behavioral: Positive for dysphoric mood and agitation. Negative for suicidal ideas. The patient is nervous/anxious.     Per HPI unless specifically indicated above     Objective:    BP 166/110 mmHg  Pulse 83  Temp(Src) 97.8 F (36.6 C)  Ht 5\' 6"  (1.676 m)  Wt 165 lb 6.4 oz (75.025 kg)  BMI 26.71 kg/m2  SpO2 97%  Wt Readings from Last 3 Encounters:  11/14/14 165 lb 6.4 oz (75.025 kg)  10/28/14 170 lb (77.111 kg)  09/29/14 160 lb (72.576 kg)    Physical Exam  Constitutional: She is oriented to person, place, and time. She appears well-developed and well-nourished.  HENT:  Head: Normocephalic and atraumatic.  Neck: Neck supple.  Cardiovascular: Normal rate and regular rhythm.   Pulmonary/Chest: Effort normal and breath sounds normal.  Abdominal: Soft. Bowel sounds are normal. She exhibits no mass. There is no tenderness.  Lymphadenopathy:    She has no cervical adenopathy.  Neurological: She is alert and oriented to person,  place, and time.  Skin: Skin is warm and dry.  Psychiatric: She has a normal mood and affect. Her behavior is normal.  Vitals reviewed.       Assessment & Plan:   Encounter Diagnoses  Name Primary?  . Rheumatoid arthritis, involving unspecified site, unspecified rheumatoid factor presence (HCC) Yes  . Hyperlipemia   . Cigarette nicotine dependence,  uncomplicated   . Hyperthyroidism     Discussed with pt that I do not like to keep her on prednisone but her seeing a rheumatologist has been delayed due to her being dismissed by Dr Kellie Simmering and then having to be referred to [facility].  She does have an appt there on Dec 7. Sent another rx which should last her until that time.  Will re-enter referral for endocrinology (originally referred 10/03/14 but pt has still not gotten her cone discount approved yet)  Counseled on smoking cessation  F/u 6 wk to make sure that pt has gotten appts with specialists that she needs

## 2014-11-19 DIAGNOSIS — M069 Rheumatoid arthritis, unspecified: Secondary | ICD-10-CM | POA: Insufficient documentation

## 2014-11-19 DIAGNOSIS — E0590 Thyrotoxicosis, unspecified without thyrotoxic crisis or storm: Secondary | ICD-10-CM | POA: Insufficient documentation

## 2014-11-26 ENCOUNTER — Encounter (HOSPITAL_COMMUNITY): Payer: Self-pay | Admitting: *Deleted

## 2014-11-26 ENCOUNTER — Emergency Department (HOSPITAL_COMMUNITY)
Admission: EM | Admit: 2014-11-26 | Discharge: 2014-11-26 | Disposition: A | Discharge status: Home / Self Care | Payer: Self-pay | Attending: Emergency Medicine | Admitting: Emergency Medicine

## 2014-11-26 DIAGNOSIS — M79672 Pain in left foot: Secondary | ICD-10-CM | POA: Insufficient documentation

## 2014-11-26 DIAGNOSIS — J449 Chronic obstructive pulmonary disease, unspecified: Secondary | ICD-10-CM | POA: Insufficient documentation

## 2014-11-26 DIAGNOSIS — Z79899 Other long term (current) drug therapy: Secondary | ICD-10-CM | POA: Insufficient documentation

## 2014-11-26 DIAGNOSIS — Z8659 Personal history of other mental and behavioral disorders: Secondary | ICD-10-CM | POA: Insufficient documentation

## 2014-11-26 DIAGNOSIS — Z862 Personal history of diseases of the blood and blood-forming organs and certain disorders involving the immune mechanism: Secondary | ICD-10-CM | POA: Insufficient documentation

## 2014-11-26 DIAGNOSIS — M25572 Pain in left ankle and joints of left foot: Secondary | ICD-10-CM | POA: Insufficient documentation

## 2014-11-26 DIAGNOSIS — Z7952 Long term (current) use of systemic steroids: Secondary | ICD-10-CM | POA: Insufficient documentation

## 2014-11-26 DIAGNOSIS — G8929 Other chronic pain: Secondary | ICD-10-CM | POA: Insufficient documentation

## 2014-11-26 DIAGNOSIS — F17210 Nicotine dependence, cigarettes, uncomplicated: Secondary | ICD-10-CM | POA: Insufficient documentation

## 2014-11-26 DIAGNOSIS — I10 Essential (primary) hypertension: Secondary | ICD-10-CM | POA: Insufficient documentation

## 2014-11-26 MED ORDER — KETOROLAC TROMETHAMINE 60 MG/2ML IM SOLN
60.0000 mg | Freq: Once | INTRAMUSCULAR | Status: AC
  Administered 2014-11-26: 60 mg via INTRAMUSCULAR
  Filled 2014-11-26: qty 2

## 2014-11-26 NOTE — ED Notes (Signed)
Patient reports left ankle/foot pain. Denies injury.

## 2014-11-26 NOTE — Discharge Instructions (Signed)

## 2014-11-26 NOTE — ED Provider Notes (Signed)
CSN: [id number]     Arrival date & time 11/26/14  1416 History  By signing my name below, I, Jarvis Morgan, attest that this documentation has been prepared under the direction and in the presence of Langston Masker Electronically Signed: Jarvis Morgan, ED Scribe. 11/26/2014. 3:46 PM.     Chief Complaint  Patient presents with  . Foot Pain    The history is provided by the patient. No language interpreter was used.    HPI Comments: [name] is a 49 y.o. female with a h/o rheumatoid arthritis who presents to the Emergency Department complaining of chronic, throbbing, moderate, left ankle and left foot pain that has begun to gradually worsen over the past few days. She states she is currently taking prednisone to treat her RA but her PCP has been trying to ween her off of it. Pt admits the prednisone is providing so significant relief and is also elevating her blood sugars. Pt reports she has taken Toradol in the past with moderate relief. Pt has also been taking 500mg  Naproxen 2x with no relief. She states that the pain is exacerbated with applied pressure and use of the foot. She notes she has been attempting to see her PCP but cannot get an appt until next week. She denies any injury or trauma to the foot. She denies any other complaints at this time.   Past Medical History  Diagnosis Date  . Rheumatoid arthritis(714.0)   . Chronic anemia   . Hypertension   . Arthritis, rheumatoid (HCC)   . Incidental lung nodule   . Depression   . Chronic pain   . COPD (chronic obstructive pulmonary disease) [facility])    Past Surgical History  Procedure Laterality Date  . Tubal ligation    . Endometrial ablation    . Breast cyst excision  12/31/2010    Procedure: CYST EXCISION BREAST;  Surgeon: 01/02/2011, MD;  Location: [facility];  Service: General;  Laterality: Right;  Excision Sebaceous Cyst Right Breast   Family History  Problem Relation Age of Onset  . Anesthesia problems Neg Hx   .  Cancer Father    Social History  Substance Use Topics  . Smoking status: Current Every Day Smoker -- 1.00 packs/day for 30 years    Types: Cigarettes  . Smokeless tobacco: Never Used  . Alcohol Use: No   OB History    Gravida Para Term Preterm AB TAB SAB Ectopic Multiple Living   6 5 5  1  1   5      Review of Systems  Musculoskeletal: Positive for arthralgias.  All other systems reviewed and are negative.     Allergies  Codeine  Home Medications   Prior to Admission medications   Medication Sig Start Date End Date Taking? Authorizing Provider  albuterol (PROVENTIL HFA;VENTOLIN HFA) 108 (90 BASE) MCG/ACT inhaler Inhale 2 puffs into the lungs every 6 (six) hours as needed for wheezing or shortness of breath.    Historical Provider, MD  lovastatin (MEVACOR) 20 MG tablet Take 20 mg by mouth at bedtime.    Historical Provider, MD  Multiple Vitamin (MULTIVITAMIN WITH MINERALS) TABS Take 1 tablet by mouth daily.    Historical Provider, MD  predniSONE (DELTASONE) 20 MG tablet Take 1 tablet (20 mg total) by mouth daily with breakfast. 11/14/14   , PA-C   Triage Vitals: BP 140/94 mmHg  Pulse 92  Temp(Src) 97.5 F (36.4 C) (Oral)  Resp 20  SpO2 99%  Physical Exam  Constitutional: She is oriented to person, place, and time. She appears well-developed and well-nourished. No distress.  HENT:  Head: Normocephalic and atraumatic.  Eyes: Conjunctivae and EOM are normal.  Neck: Neck supple. No tracheal deviation present.  Cardiovascular: Normal rate.   Pulmonary/Chest: Effort normal. No respiratory distress.  Musculoskeletal: Normal range of motion.  Neurological: She is alert and oriented to person, place, and time.  Skin: Skin is warm and dry.  Psychiatric: She has a normal mood and affect. Her behavior is normal.  Nursing note and vitals reviewed.   ED Course  Procedures (including critical care time)  DIAGNOSTIC STUDIES: Oxygen Saturation is 99% on RA,  normal by my interpretation.    COORDINATION OF CARE:    Labs Review Labs Reviewed - No data to display  Imaging Review No results found. I have personally reviewed and evaluated these images and lab results as part of my medical decision-making.   EKG Interpretation None      MDM   Final diagnoses:  Chronic foot pain, left     I personally performed the services in this documentation, which was scribed in my presence.  The recorded information has been reviewed and considered.   Barnet Pall.    Lonia Skinner [place], PA-C 11/26/14 1606  Donnetta Hutching, MD 11/26/14 [phone]

## 2014-11-26 NOTE — ED Notes (Signed)
Went to call pt to put in room, pt outside smoking

## 2014-11-29 ENCOUNTER — Other Ambulatory Visit: Payer: Self-pay | Admitting: Physician Assistant

## 2014-11-29 DIAGNOSIS — Z1231 Encounter for screening mammogram for malignant neoplasm of breast: Secondary | ICD-10-CM

## 2014-12-11 ENCOUNTER — Emergency Department (HOSPITAL_COMMUNITY)
Admission: EM | Admit: 2014-12-11 | Discharge: 2014-12-11 | Disposition: A | Discharge status: Home / Self Care | Payer: Self-pay | Attending: Emergency Medicine | Admitting: Emergency Medicine

## 2014-12-11 ENCOUNTER — Encounter (HOSPITAL_COMMUNITY): Payer: Self-pay | Admitting: Emergency Medicine

## 2014-12-11 DIAGNOSIS — G8929 Other chronic pain: Secondary | ICD-10-CM | POA: Insufficient documentation

## 2014-12-11 DIAGNOSIS — Z791 Long term (current) use of non-steroidal anti-inflammatories (NSAID): Secondary | ICD-10-CM | POA: Insufficient documentation

## 2014-12-11 DIAGNOSIS — F17210 Nicotine dependence, cigarettes, uncomplicated: Secondary | ICD-10-CM | POA: Insufficient documentation

## 2014-12-11 DIAGNOSIS — M549 Dorsalgia, unspecified: Secondary | ICD-10-CM

## 2014-12-11 DIAGNOSIS — M069 Rheumatoid arthritis, unspecified: Secondary | ICD-10-CM | POA: Insufficient documentation

## 2014-12-11 DIAGNOSIS — Z7952 Long term (current) use of systemic steroids: Secondary | ICD-10-CM | POA: Insufficient documentation

## 2014-12-11 DIAGNOSIS — I10 Essential (primary) hypertension: Secondary | ICD-10-CM | POA: Insufficient documentation

## 2014-12-11 DIAGNOSIS — Z862 Personal history of diseases of the blood and blood-forming organs and certain disorders involving the immune mechanism: Secondary | ICD-10-CM | POA: Insufficient documentation

## 2014-12-11 DIAGNOSIS — F329 Major depressive disorder, single episode, unspecified: Secondary | ICD-10-CM | POA: Insufficient documentation

## 2014-12-11 DIAGNOSIS — J449 Chronic obstructive pulmonary disease, unspecified: Secondary | ICD-10-CM | POA: Insufficient documentation

## 2014-12-11 DIAGNOSIS — M545 Low back pain: Secondary | ICD-10-CM | POA: Insufficient documentation

## 2014-12-11 DIAGNOSIS — Z79899 Other long term (current) drug therapy: Secondary | ICD-10-CM | POA: Insufficient documentation

## 2014-12-11 MED ORDER — ONDANSETRON HCL 4 MG/2ML IJ SOLN
4.0000 mg | Freq: Once | INTRAMUSCULAR | Status: AC
  Administered 2014-12-11: 4 mg via INTRAMUSCULAR
  Filled 2014-12-11: qty 2

## 2014-12-11 MED ORDER — KETOROLAC TROMETHAMINE 60 MG/2ML IM SOLN
60.0000 mg | Freq: Once | INTRAMUSCULAR | Status: AC
  Administered 2014-12-11: 60 mg via INTRAMUSCULAR
  Filled 2014-12-11: qty 2

## 2014-12-11 MED ORDER — CYCLOBENZAPRINE HCL 10 MG PO TABS: 10.0000 mg | ORAL_TABLET | Freq: Two times a day (BID) | ORAL | Status: DC | PRN

## 2014-12-11 MED ORDER — TRAMADOL HCL 50 MG PO TABS: 50.0000 mg | ORAL_TABLET | Freq: Four times a day (QID) | ORAL | Status: DC | PRN

## 2014-12-11 NOTE — Discharge Instructions (Signed)

## 2014-12-11 NOTE — ED Notes (Signed)
Pt c/o LT lower back pain that radiates down LT leg. Pt reports painting yesterday when pain occurred. Pt ambulatory. Pain increases with movement.

## 2014-12-11 NOTE — ED Provider Notes (Signed)
CSN: [id number]     Arrival date & time 12/11/14  1241 History   First MD Initiated Contact with Patient 12/11/14 1335     Chief Complaint  Patient presents with  . Back Pain     (Consider location/radiation/quality/duration/timing/severity/associated sxs/prior Treatment) Patient is a 49 y.o. female presenting with back pain. The history is provided by the patient. No language interpreter was used.  Back Pain Location:  Lumbar spine Quality:  Aching Radiates to:  R posterior upper leg Pain severity:  Moderate Pain is:  Same all the time Duration:  3 days Timing:  Constant Progression:  Worsening Chronicity:  Recurrent Context: not recent injury   Relieved by:  Nothing Worsened by:  Nothing tried Ineffective treatments:  None tried Associated symptoms: no tingling and no weakness     Past Medical History  Diagnosis Date  . Rheumatoid arthritis(714.0)   . Chronic anemia   . Hypertension   . Arthritis, rheumatoid (HCC)   . Incidental lung nodule   . Depression   . Chronic pain   . COPD (chronic obstructive pulmonary disease) [facility])    Past Surgical History  Procedure Laterality Date  . Tubal ligation    . Endometrial ablation    . Breast cyst excision  12/31/2010    Procedure: CYST EXCISION BREAST;  Surgeon: Fabio Bering, MD;  Location: [facility];  Service: General;  Laterality: Right;  Excision Sebaceous Cyst Right Breast   Family History  Problem Relation Age of Onset  . Anesthesia problems Neg Hx   . Cancer Father    Social History  Substance Use Topics  . Smoking status: Current Every Day Smoker -- 1.00 packs/day for 30 years    Types: Cigarettes  . Smokeless tobacco: Never Used  . Alcohol Use: No   OB History    Gravida Para Term Preterm AB TAB SAB Ectopic Multiple Living   6 5 5  1  1   5      Review of Systems  Musculoskeletal: Positive for back pain.  Neurological: Negative for tingling and weakness.  All other systems reviewed and are  negative.     Allergies  Codeine  Home Medications   Prior to Admission medications   Medication Sig Start Date End Date Taking? Authorizing Provider  Multiple Vitamin (MULTIVITAMIN WITH MINERALS) TABS Take 1 tablet by mouth daily.   Yes Historical Provider, MD  naproxen sodium (ANAPROX) 220 MG tablet Take 660 mg by mouth 3 (three) times daily with meals.   Yes Historical Provider, MD  Omega-3 Fatty Acids (FISH OIL) 1000 MG CAPS Take 1,000 mg by mouth daily.   Yes Historical Provider, MD  predniSONE (DELTASONE) 20 MG tablet Take 1 tablet (20 mg total) by mouth daily with breakfast. Patient taking differently: Take 20 mg by mouth 2 (two) times daily. Started 11/26/14 11/14/14  Yes 13/2/16, PA-C  albuterol (PROVENTIL HFA;VENTOLIN HFA) 108 (90 BASE) MCG/ACT inhaler Inhale 2 puffs into the lungs every 6 (six) hours as needed for wheezing or shortness of breath.    Historical Provider, MD  cyclobenzaprine (FLEXERIL) 10 MG tablet Take 1 tablet (10 mg total) by mouth 2 (two) times daily as needed for muscle spasms. 12/11/14   12/13/14, PA-C  traMADol (ULTRAM) 50 MG tablet Take 1 tablet (50 mg total) by mouth every 6 (six) hours as needed. 12/11/14   12/13/14, PA-C   BP 129/82 mmHg  Pulse 102  Temp(Src) 98.1 F (36.7 C) (Oral)  Resp 18  Ht 5\' 6"  (1.676 m)  Wt 77.111 kg  BMI 27.45 kg/m2  SpO2 100% Physical Exam  Constitutional: She is oriented to person, place, and time. She appears well-developed and well-nourished.  HENT:  Head: Normocephalic.  Eyes: EOM are normal.  Neck: Normal range of motion.  Cardiovascular: Normal rate.   Pulmonary/Chest: Effort normal.  Abdominal: She exhibits no distension.  Musculoskeletal:  Pain with movement,  nv and ns intact   Neurological: She is alert and oriented to person, place, and time.  Psychiatric: She has a normal mood and affect.  Nursing note and vitals reviewed.   ED Course  Procedures (including critical care  time) Labs Review Labs Reviewed - No data to display  Imaging Review No results found. I have personally reviewed and evaluated these images and lab results as part of my medical decision-making.   EKG Interpretation None      MDM Pt given torodol and zofran IM.     Final diagnoses:  Chronic back pain    Robaxin Ultram Follow up with your Physician for recheck.    [place], PA-C 12/11/14 1630  12/13/14, MD 12/18/14 [phone]

## 2014-12-13 ENCOUNTER — Other Ambulatory Visit: Payer: Self-pay | Admitting: Physician Assistant

## 2014-12-13 ENCOUNTER — Ambulatory Visit (HOSPITAL_COMMUNITY)
Admission: RE | Admit: 2014-12-13 | Discharge: 2014-12-13 | Disposition: A | Discharge status: Home / Self Care | Payer: Self-pay | Source: Ambulatory Visit | Attending: Physician Assistant | Admitting: Physician Assistant

## 2014-12-13 DIAGNOSIS — Z1231 Encounter for screening mammogram for malignant neoplasm of breast: Secondary | ICD-10-CM

## 2014-12-19 ENCOUNTER — Emergency Department (HOSPITAL_COMMUNITY)
Admission: EM | Admit: 2014-12-19 | Discharge: 2014-12-19 | Disposition: A | Discharge status: Home / Self Care | Payer: Self-pay | Attending: Emergency Medicine | Admitting: Emergency Medicine

## 2014-12-19 ENCOUNTER — Encounter (HOSPITAL_COMMUNITY): Payer: Self-pay

## 2014-12-19 DIAGNOSIS — I10 Essential (primary) hypertension: Secondary | ICD-10-CM | POA: Insufficient documentation

## 2014-12-19 DIAGNOSIS — M109 Gout, unspecified: Secondary | ICD-10-CM | POA: Insufficient documentation

## 2014-12-19 DIAGNOSIS — Z791 Long term (current) use of non-steroidal anti-inflammatories (NSAID): Secondary | ICD-10-CM | POA: Insufficient documentation

## 2014-12-19 DIAGNOSIS — F329 Major depressive disorder, single episode, unspecified: Secondary | ICD-10-CM | POA: Insufficient documentation

## 2014-12-19 DIAGNOSIS — Z79899 Other long term (current) drug therapy: Secondary | ICD-10-CM | POA: Insufficient documentation

## 2014-12-19 DIAGNOSIS — Z7952 Long term (current) use of systemic steroids: Secondary | ICD-10-CM | POA: Insufficient documentation

## 2014-12-19 DIAGNOSIS — M5431 Sciatica, right side: Secondary | ICD-10-CM | POA: Insufficient documentation

## 2014-12-19 DIAGNOSIS — Z862 Personal history of diseases of the blood and blood-forming organs and certain disorders involving the immune mechanism: Secondary | ICD-10-CM | POA: Insufficient documentation

## 2014-12-19 DIAGNOSIS — G8929 Other chronic pain: Secondary | ICD-10-CM | POA: Insufficient documentation

## 2014-12-19 DIAGNOSIS — F17210 Nicotine dependence, cigarettes, uncomplicated: Secondary | ICD-10-CM | POA: Insufficient documentation

## 2014-12-19 DIAGNOSIS — J449 Chronic obstructive pulmonary disease, unspecified: Secondary | ICD-10-CM | POA: Insufficient documentation

## 2014-12-19 MED ORDER — DICLOFENAC SODIUM 75 MG PO TBEC: 75.0000 mg | DELAYED_RELEASE_TABLET | Freq: Two times a day (BID) | ORAL | Status: DC

## 2014-12-19 MED ORDER — DEXAMETHASONE SODIUM PHOSPHATE 4 MG/ML IJ SOLN
8.0000 mg | Freq: Once | INTRAMUSCULAR | Status: AC
  Administered 2014-12-19: 8 mg via INTRAMUSCULAR
  Filled 2014-12-19: qty 2

## 2014-12-19 MED ORDER — PROMETHAZINE HCL 12.5 MG PO TABS
25.0000 mg | ORAL_TABLET | Freq: Once | ORAL | Status: AC
  Administered 2014-12-19: 25 mg via ORAL
  Filled 2014-12-19: qty 2

## 2014-12-19 MED ORDER — MORPHINE SULFATE (PF) 4 MG/ML IV SOLN
4.0000 mg | Freq: Once | INTRAVENOUS | Status: AC
  Administered 2014-12-19: 4 mg via INTRAMUSCULAR
  Filled 2014-12-19: qty 1

## 2014-12-19 MED ORDER — BACLOFEN 10 MG PO TABS: 10.0000 mg | ORAL_TABLET | Freq: Three times a day (TID) | ORAL | Status: DC

## 2014-12-19 MED ORDER — CYCLOBENZAPRINE HCL 10 MG PO TABS
10.0000 mg | ORAL_TABLET | Freq: Once | ORAL | Status: AC
  Administered 2014-12-19: 10 mg via ORAL
  Filled 2014-12-19: qty 1

## 2014-12-19 NOTE — ED Notes (Signed)
Patient ambulatory to bathroom, slow gait, complaining of pain with ambulation.

## 2014-12-19 NOTE — ED Notes (Signed)
Pt reports history of sciatica and usually has pain in lower back radiating down left leg.  Reports recently was doing some work on her house and r lower back started to hurt.

## 2014-12-19 NOTE — ED Provider Notes (Signed)
CSN: [id number]     Arrival date & time 12/19/14  1027 History   First MD Initiated Contact with Patient 12/19/14 1114     Chief Complaint  Patient presents with  . Back Pain     (Consider location/radiation/quality/duration/timing/severity/associated sxs/prior Treatment) HPI Comments: Primary MD. [facility].  Patient is a 49 y.o. female presenting with back pain. The history is provided by the patient.  Back Pain Location:  Lumbar spine Quality:  Burning and shooting Radiates to:  R foot Pain severity:  Moderate Pain is:  Same all the time Onset quality:  Gradual Duration:  3 days Timing:  Intermittent Progression:  Worsening Chronicity:  Recurrent Context comment:  Bending and stooping, and painting Relieved by:  Nothing Worsened by:  Movement Ineffective treatments:  Lying down Associated symptoms: no bladder incontinence, no bowel incontinence and no perianal numbness     Past Medical History  Diagnosis Date  . Rheumatoid arthritis(714.0)   . Chronic anemia   . Hypertension   . Arthritis, rheumatoid (HCC)   . Incidental lung nodule   . Depression   . Chronic pain   . COPD (chronic obstructive pulmonary disease) [facility])    Past Surgical History  Procedure Laterality Date  . Tubal ligation    . Endometrial ablation    . Breast cyst excision  12/31/2010    Procedure: CYST EXCISION BREAST;  Surgeon: Fabio Bering, MD;  Location: [facility];  Service: General;  Laterality: Right;  Excision Sebaceous Cyst Right Breast   Family History  Problem Relation Age of Onset  . Anesthesia problems Neg Hx   . Cancer Father    Social History  Substance Use Topics  . Smoking status: Current Every Day Smoker -- 1.00 packs/day for 30 years    Types: Cigarettes  . Smokeless tobacco: Never Used  . Alcohol Use: No   OB History    Gravida Para Term Preterm AB TAB SAB Ectopic Multiple Living   6 5 5  1  1   5      Review of Systems  Gastrointestinal: Negative for  bowel incontinence.  Genitourinary: Negative for bladder incontinence.  Musculoskeletal: Positive for back pain and arthralgias.  Psychiatric/Behavioral:       Depression  All other systems reviewed and are negative.     Allergies  Codeine  Home Medications   Prior to Admission medications   Medication Sig Start Date End Date Taking? Authorizing Provider  albuterol (PROVENTIL HFA;VENTOLIN HFA) 108 (90 BASE) MCG/ACT inhaler Inhale 2 puffs into the lungs every 6 (six) hours as needed for wheezing or shortness of breath.    Historical Provider, MD  cyclobenzaprine (FLEXERIL) 10 MG tablet Take 1 tablet (10 mg total) by mouth 2 (two) times daily as needed for muscle spasms. 12/11/14   12/13/14, PA-C  Multiple Vitamin (MULTIVITAMIN WITH MINERALS) TABS Take 1 tablet by mouth daily.    Historical Provider, MD  naproxen sodium (ANAPROX) 220 MG tablet Take 660 mg by mouth 3 (three) times daily with meals.    Historical Provider, MD  Omega-3 Fatty Acids (FISH OIL) 1000 MG CAPS Take 1,000 mg by mouth daily.    Historical Provider, MD  predniSONE (DELTASONE) 20 MG tablet Take 1 tablet (20 mg total) by mouth daily with breakfast. Patient taking differently: Take 20 mg by mouth 2 (two) times daily. Started 11/26/14 11/14/14   13/2/16, PA-C  traMADol (ULTRAM) 50 MG tablet Take 1 tablet (50 mg total) by mouth  every 6 (six) hours as needed. 12/11/14   Lonia Skinner Sofia, PA-C   BP 166/99 mmHg  Pulse 93  Temp(Src) 98.3 F (36.8 C) (Oral)  Resp 20  Ht 5\' 6"  (1.676 m)  Wt 77.111 kg  BMI 27.45 kg/m2  SpO2 100% Physical Exam  Constitutional: She is oriented to person, place, and time. She appears well-developed and well-nourished.  Non-toxic appearance.  HENT:  Head: Normocephalic.  Right Ear: Tympanic membrane and external ear normal.  Left Ear: Tympanic membrane and external ear normal.  Eyes: EOM and lids are normal. Pupils are equal, round, and reactive to light.  Neck: Normal range  of motion. Neck supple. Carotid bruit is not present.  Cardiovascular: Normal rate, regular rhythm, normal heart sounds, intact distal pulses and normal pulses.   Pulmonary/Chest: Breath sounds normal. No respiratory distress.  Abdominal: Soft. Bowel sounds are normal. There is no tenderness. There is no guarding.  Musculoskeletal: Normal range of motion.  Lymphadenopathy:       Head (right side): No submandibular adenopathy present.       Head (left side): No submandibular adenopathy present.    She has no cervical adenopathy.  Neurological: She is alert and oriented to person, place, and time. She has normal strength. No cranial nerve deficit or sensory deficit.  Skin: Skin is warm and dry.  Psychiatric: She has a normal mood and affect. Her speech is normal.  Nursing note and vitals reviewed.   ED Course  Procedures (including critical care time) Labs Review Labs Reviewed - No data to display  Imaging Review No results found. I have personally reviewed and evaluated these images and lab results as part of my medical decision-making.   EKG Interpretation None      MDM  Vital signs reviewed. Examination favors sciatica. Discussed with patient the need to be seen by orthopedics for evaluation concerning the effect of her rheumatoid arthritis on this back pain, versus disc issue, versus muscle strain.    Final diagnoses:  Sciatica, right    **I have reviewed nursing notes, vital signs, and all appropriate lab and imaging results for this patient. , PA-C 12/19/14 2000  14/07/16, MD 12/22/14 0001

## 2014-12-19 NOTE — Discharge Instructions (Signed)
It is important that you see one of the orthopedic specialist concerning the ongoing issues with your back. Please see Dr. Eulah Pont, or the orthopedic specialist of your choice as sone as possible. Please use baclofen and diclofenac daily. Baclofen may cause drowsiness, please use this medication with caution. Please continue your steroid medications as previously ordered. Heat to your lower back area may be helpful. Sciatica Sciatica is pain, weakness, numbness, or tingling along your sciatic nerve. The nerve starts in the lower back and runs down the back of each leg. Nerve damage or certain conditions pinch or put pressure on the sciatic nerve. This causes the pain, weakness, and other discomforts of sciatica. HOME CARE   Only take medicine as told by your doctor.  Apply ice to the affected area for 20 minutes. Do this 3-4 times a day for the first 48-72 hours. Then try heat in the same way.  Exercise, stretch, or do your usual activities if these do not make your pain worse.  Go to physical therapy as told by your doctor.  Keep all doctor visits as told.  Do not wear high heels or shoes that are not supportive.  Get a firm mattress if your mattress is too soft to lessen pain and discomfort. GET HELP RIGHT AWAY IF:   You cannot control when you poop (bowel movement) or pee (urinate).  You have more weakness in your lower back, lower belly (pelvis), butt (buttocks), or legs.  You have redness or puffiness (swelling) of your back.  You have a burning feeling when you pee.  You have pain that gets worse when you lie down.  You have pain that wakes you from your sleep.  Your pain is worse than past pain.  Your pain lasts longer than 4 weeks.  You are suddenly losing weight without reason. MAKE SURE YOU:   Understand these instructions.  Will watch this condition.  Will get help right away if you are not doing well or get worse.   This information is not intended to replace  advice given to you by your health care provider. Make sure you discuss any questions you have with your health care provider.   Document Released: 10/08/2007 Document Revised: 09/19/2014 Document Reviewed: 05/10/2011 Elsevier Interactive Patient Education [employer]

## 2014-12-23 ENCOUNTER — Encounter (HOSPITAL_COMMUNITY): Payer: Self-pay | Admitting: Emergency Medicine

## 2014-12-23 ENCOUNTER — Emergency Department (HOSPITAL_COMMUNITY): Payer: Self-pay

## 2014-12-23 ENCOUNTER — Emergency Department (HOSPITAL_COMMUNITY)
Admission: EM | Admit: 2014-12-23 | Discharge: 2014-12-23 | Disposition: A | Discharge status: Home / Self Care | Payer: Self-pay | Attending: Emergency Medicine | Admitting: Emergency Medicine

## 2014-12-23 DIAGNOSIS — R1013 Epigastric pain: Secondary | ICD-10-CM

## 2014-12-23 DIAGNOSIS — F17210 Nicotine dependence, cigarettes, uncomplicated: Secondary | ICD-10-CM | POA: Insufficient documentation

## 2014-12-23 DIAGNOSIS — I10 Essential (primary) hypertension: Secondary | ICD-10-CM | POA: Insufficient documentation

## 2014-12-23 DIAGNOSIS — Z8659 Personal history of other mental and behavioral disorders: Secondary | ICD-10-CM | POA: Insufficient documentation

## 2014-12-23 DIAGNOSIS — K2970 Gastritis, unspecified, without bleeding: Secondary | ICD-10-CM

## 2014-12-23 DIAGNOSIS — J449 Chronic obstructive pulmonary disease, unspecified: Secondary | ICD-10-CM | POA: Insufficient documentation

## 2014-12-23 DIAGNOSIS — G8929 Other chronic pain: Secondary | ICD-10-CM | POA: Insufficient documentation

## 2014-12-23 DIAGNOSIS — Z862 Personal history of diseases of the blood and blood-forming organs and certain disorders involving the immune mechanism: Secondary | ICD-10-CM | POA: Insufficient documentation

## 2014-12-23 DIAGNOSIS — Z79899 Other long term (current) drug therapy: Secondary | ICD-10-CM | POA: Insufficient documentation

## 2014-12-23 DIAGNOSIS — M069 Rheumatoid arthritis, unspecified: Secondary | ICD-10-CM | POA: Insufficient documentation

## 2014-12-23 DIAGNOSIS — Z791 Long term (current) use of non-steroidal anti-inflammatories (NSAID): Secondary | ICD-10-CM | POA: Insufficient documentation

## 2014-12-23 LAB — CBC WITH DIFFERENTIAL/PLATELET
BASOS PCT: 0 %
Basophils Absolute: 0 10*3/uL (ref 0.0–0.1)
EOS ABS: 0 10*3/uL (ref 0.0–0.7)
Eosinophils Relative: 0 %
HEMATOCRIT: 35 % — AB (ref 36.0–46.0)
HEMOGLOBIN: 10.9 g/dL — AB (ref 12.0–15.0)
LYMPHS ABS: 2.1 10*3/uL (ref 0.7–4.0)
Lymphocytes Relative: 13 %
MCH: 25.7 pg — ABNORMAL LOW (ref 26.0–34.0)
MCHC: 31.1 g/dL (ref 30.0–36.0)
MCV: 82.5 fL (ref 78.0–100.0)
MONOS PCT: 4 %
Monocytes Absolute: 0.7 10*3/uL (ref 0.1–1.0)
NEUTROS ABS: 13.3 10*3/uL — AB (ref 1.7–7.7)
NEUTROS PCT: 83 %
Platelets: 501 10*3/uL — ABNORMAL HIGH (ref 150–400)
RBC: 4.24 MIL/uL (ref 3.87–5.11)
RDW: 19.1 % — ABNORMAL HIGH (ref 11.5–15.5)
WBC: 16.2 10*3/uL — AB (ref 4.0–10.5)

## 2014-12-23 LAB — URINE MICROSCOPIC-ADD ON

## 2014-12-23 LAB — COMPREHENSIVE METABOLIC PANEL
ALBUMIN: 4.1 g/dL (ref 3.5–5.0)
ALK PHOS: 132 U/L — AB (ref 38–126)
ALT: 19 U/L (ref 14–54)
AST: 15 U/L (ref 15–41)
Anion gap: 6 (ref 5–15)
BILIRUBIN TOTAL: 0.3 mg/dL (ref 0.3–1.2)
BUN: 16 mg/dL (ref 6–20)
CHLORIDE: 103 mmol/L (ref 101–111)
CO2: 31 mmol/L (ref 22–32)
Calcium: 9.8 mg/dL (ref 8.9–10.3)
Creatinine, Ser: 0.58 mg/dL (ref 0.44–1.00)
GFR calc Af Amer: >60 mL/min (ref >60)
GFR calc non Af Amer: >60 mL/min (ref >60)
GLUCOSE: 82 mg/dL (ref 65–99)
Potassium: 3.9 mmol/L (ref 3.5–5.1)
SODIUM: 140 mmol/L (ref 135–145)
TOTAL PROTEIN: 7.7 g/dL (ref 6.5–8.1)

## 2014-12-23 LAB — URINALYSIS, ROUTINE W REFLEX MICROSCOPIC
Bilirubin Urine: NEGATIVE
GLUCOSE, UA: NEGATIVE mg/dL
Ketones, ur: NEGATIVE mg/dL
Nitrite: NEGATIVE
PH: 7.5 (ref 5.0–8.0)
PROTEIN: NEGATIVE mg/dL
Specific Gravity, Urine: 1.01 (ref 1.005–1.030)

## 2014-12-23 LAB — I-STAT TROPONIN, ED: TROPONIN I, POC: 0.01 ng/mL (ref 0.00–0.08)

## 2014-12-23 LAB — LIPASE, BLOOD: Lipase: 20 U/L (ref 11–51)

## 2014-12-23 LAB — I-STAT BETA HCG BLOOD, ED (MC, WL, AP ONLY): I-stat hCG, quantitative: <5 m[IU]/mL (ref <5)

## 2014-12-23 LAB — TROPONIN I: Troponin I: <0.03 ng/mL (ref <0.031)

## 2014-12-23 MED ORDER — ONDANSETRON HCL 4 MG/2ML IJ SOLN: 4.0000 mg | Freq: Once | INTRAMUSCULAR | Status: DC

## 2014-12-23 MED ORDER — GI COCKTAIL ~~LOC~~
30.0000 mL | Freq: Once | ORAL | Status: AC
  Administered 2014-12-23: 30 mL via ORAL
  Filled 2014-12-23: qty 30

## 2014-12-23 MED ORDER — ONDANSETRON HCL 4 MG/2ML IJ SOLN
4.0000 mg | Freq: Once | INTRAMUSCULAR | Status: AC
  Administered 2014-12-23: 4 mg via INTRAVENOUS
  Filled 2014-12-23: qty 2

## 2014-12-23 MED ORDER — OMEPRAZOLE 20 MG PO CPDR: 20.0000 mg | DELAYED_RELEASE_CAPSULE | Freq: Every day | ORAL | Status: DC

## 2014-12-23 MED ORDER — FAMOTIDINE IN NACL 20-0.9 MG/50ML-% IV SOLN
20.0000 mg | Freq: Once | INTRAVENOUS | Status: AC
  Administered 2014-12-23: 20 mg via INTRAVENOUS
  Filled 2014-12-23: qty 50

## 2014-12-23 MED ORDER — MORPHINE SULFATE (PF) 4 MG/ML IV SOLN
4.0000 mg | Freq: Once | INTRAVENOUS | Status: AC
  Administered 2014-12-23: 4 mg via INTRAVENOUS
  Filled 2014-12-23: qty 1

## 2014-12-23 NOTE — ED Provider Notes (Signed)
CSN: [id number]     Arrival date & time 12/23/14  1656 History   First MD Initiated Contact with Patient 12/23/14 1843     Chief Complaint  Patient presents with  . Emesis     (Consider location/radiation/quality/duration/timing/severity/associated sxs/prior Treatment) HPI 49 year old female who presents with nausea, vomiting, abdominal distention. History of rheumatoid arthritis, chronic back pain, COPD, and sciatica. States that she was started on diclofenac and baclofen one week ago for treatment of her sciatica. Taking this with naprosyn as well. States that throughout this week after taking medications she would feel unwell. After eating or drinking coffee noticed that her stomach would bloat. Today developed abdominal distention, and sharp epigastric pain.Marland Kitchen Has had multiple episodes of nausea and vomiting, one episode with pink tinged vomit. No coffee-ground emesis melena or hematochezia. No fevers or chills. History of tubal ligation and no prior abdominal surgeries. No dysuria, urinary frequency, flank pain, vaginal bleeding or discharge. At a bowel movement this morning that was normal. No diarrhea.    Past Medical History  Diagnosis Date  . Rheumatoid arthritis(714.0)   . Chronic anemia   . Hypertension   . Arthritis, rheumatoid (HCC)   . Incidental lung nodule   . Depression   . Chronic pain   . COPD (chronic obstructive pulmonary disease) [facility])    Past Surgical History  Procedure Laterality Date  . Tubal ligation    . Endometrial ablation    . Breast cyst excision  12/31/2010    Procedure: CYST EXCISION BREAST;  Surgeon: Fabio Bering, MD;  Location: [facility];  Service: General;  Laterality: Right;  Excision Sebaceous Cyst Right Breast   Family History  Problem Relation Age of Onset  . Anesthesia problems Neg Hx   . Cancer Father    Social History  Substance Use Topics  . Smoking status: Current Every Day Smoker -- 1.00 packs/day for 30 years    Types: Cigarettes   . Smokeless tobacco: Never Used  . Alcohol Use: No   OB History    Gravida Para Term Preterm AB TAB SAB Ectopic Multiple Living   6 5 5  1  1   5      Review of Systems 10/14 systems reviewed and are negative other than those stated in the HPI    Allergies  Codeine  Home Medications   Prior to Admission medications   Medication Sig Start Date End Date Taking? Authorizing Provider  albuterol (PROVENTIL HFA;VENTOLIN HFA) 108 (90 BASE) MCG/ACT inhaler Inhale 2 puffs into the lungs every 6 (six) hours as needed for wheezing or shortness of breath.   Yes Historical Provider, MD  baclofen (LIORESAL) 10 MG tablet Take 1 tablet (10 mg total) by mouth 3 (three) times daily. 12/19/14  Yes 14/7/16, PA-C  diclofenac (VOLTAREN) 75 MG EC tablet Take 1 tablet (75 mg total) by mouth 2 (two) times daily. 12/19/14  Yes 14/7/16, PA-C  Multiple Vitamin (MULTIVITAMIN WITH MINERALS) TABS Take 1 tablet by mouth daily.   Yes Historical Provider, MD  naproxen sodium (ANAPROX) 220 MG tablet Take 660 mg by mouth 3 (three) times daily with meals.   Yes Historical Provider, MD  Omega-3 Fatty Acids (FISH OIL) 1000 MG CAPS Take 1,000 mg by mouth daily.   Yes Historical Provider, MD  predniSONE (DELTASONE) 20 MG tablet Take 1 tablet (20 mg total) by mouth daily with breakfast. Patient taking differently: Take 20 mg by mouth 2 (two) times daily. Started 11/26/14 11/14/14  Yes  Jacquelin Hawking, PA-C  cyclobenzaprine (FLEXERIL) 10 MG tablet Take 1 tablet (10 mg total) by mouth 2 (two) times daily as needed for muscle spasms. Patient not taking: Reported on 12/23/2014 12/11/14   Elson Areas, PA-C  omeprazole (PRILOSEC) 20 MG capsule Take 1 capsule (20 mg total) by mouth daily. 12/23/14   Lavera Guise, MD  traMADol (ULTRAM) 50 MG tablet Take 1 tablet (50 mg total) by mouth every 6 (six) hours as needed. Patient not taking: Reported on 12/23/2014 12/11/14   Lonia Skinner Sofia, PA-C   BP 153/97 mmHg  Pulse 84   Temp(Src) 98 F (36.7 C) (Oral)  Resp 18  Ht 5\' 6"  (1.676 m)  Wt 172 lb (78.019 kg)  BMI 27.77 kg/m2  SpO2 98% Physical Exam Physical Exam  Nursing note and vitals reviewed. Constitutional: Well developed, well nourished, non-toxic, and in no acute distress Head: Normocephalic and atraumatic.  Mouth/Throat: Oropharynx is clear and moist.  Neck: Normal range of motion. Neck supple.  Cardiovascular: Normal rate and regular rhythm.   Pulmonary/Chest: Effort normal and breath sounds normal.  Abdominal: Soft. There is epigastric tenderness. Mild distension.  There is no rebound and no guarding.  Musculoskeletal: Normal range of motion.  Neurological: Alert, no facial droop, fluent speech, moves all extremities symmetrically Skin: Skin is warm and dry.  Psychiatric: Cooperative   ED Course  Procedures (including critical care time) Labs Review Labs Reviewed  CBC WITH DIFFERENTIAL/PLATELET - Abnormal; Notable for the following:    WBC 16.2 (*)    Hemoglobin 10.9 (*)    HCT 35.0 (*)    MCH 25.7 (*)    RDW 19.1 (*)    Platelets 501 (*)    Neutro Abs 13.3 (*)    All other components within normal limits  COMPREHENSIVE METABOLIC PANEL - Abnormal; Notable for the following:    Alkaline Phosphatase 132 (*)    All other components within normal limits  URINALYSIS, ROUTINE W REFLEX MICROSCOPIC (NOT AT [facility]) - Abnormal; Notable for the following:    Hgb urine dipstick SMALL (*)    Leukocytes, UA TRACE (*)    All other components within normal limits  URINE MICROSCOPIC-ADD ON - Abnormal; Notable for the following:    Squamous Epithelial / LPF 0-5 (*)    Bacteria, UA RARE (*)    All other components within normal limits  LIPASE, BLOOD  TROPONIN I  I-STAT BETA HCG BLOOD, ED ([facility], [facility], [facility] ONLY)  I-STAT TROPOININ, ED    Imaging Review Dg Abd Acute W/chest  12/23/2014  CLINICAL DATA:  Acute onset of generalized abdominal distention and vomiting. Initial encounter. EXAM: DG ABDOMEN  ACUTE W/ 1V CHEST COMPARISON:  CT of the abdomen and pelvis from 08/10/2014, and CT of the chest performed 10/28/2014 FINDINGS: The lungs are well-aerated. Vascular congestion is noted. Mildly increased interstitial markings may reflect atelectasis or minimal interstitial edema. There is no evidence of pleural effusion or pneumothorax. The cardiomediastinal silhouette is borderline normal in size. The visualized bowel gas pattern is unremarkable. Scattered stool and air are seen within the colon; there is no evidence of small bowel dilatation to suggest obstruction. No free intra-abdominal air is identified on the provided upright view. No acute osseous abnormalities are seen; the sacroiliac joints are unremarkable in appearance. IMPRESSION: 1. Unremarkable bowel gas pattern; no free intra-abdominal air seen. Moderate amount of stool noted in the colon. 2. Vascular congestion noted. Mildly increased interstitial markings may reflect atelectasis or minimal interstitial edema.  Electronically Signed   By: Roanna Raider M.D.   On: 12/23/2014 21:51   I have personally reviewed and evaluated these images and lab results as part of my medical decision-making.   EKG Interpretation   Date/Time:  Sunday December 23 2014 19:34:57 EST Ventricular Rate:  85 PR Interval:  145 QRS Duration: 87 QT Interval:  340 QTC Calculation: 404 R Axis:   76 Text Interpretation:  Sinus rhythm LAE, consider biatrial enlargement Left  ventricular hypertrophy No significant change since last tracing Confirmed  by Lucyann Romano MD, Annabelle Harman ([id number]) on 12/23/2014 8:30:15 PM      MDM   Final diagnoses:  Gastritis  Epigastric abdominal pain    In short this is a 49 year old female who presents with nausea, vomiting, and epigastric abdominal pain. This is likely due to gastritis in the setting of taking both diclofenac and Naprosyn for this one week. She has stable vital signs, and a soft and non-peritoneal abdomen. Mild distention of her  abdomen is noted, but clinically does not seem obstructed. X-ray of her abdomen reveals normal bowel gas pattern as well as some moderate amount of stool. No evidence of free air. Basic blood work including CBC, lipase, comprehensive metabolic panel, and UA are unremarkable. She is not pregnant. Mild leukocytosis of 16, but is likely due to inflammatory reaction related to her stomach. I have low suspicion for other serious or toxic etiology of her abdominal symptoms today. She did initially receive Pepcid, IV fluids, Zofran, and morphine with minimal relief in pain. Then given a GI cocktail, with full relief of her symptoms. Discussed discontinuation of diclofenac and Naprosyn in the setting of her symptoms. Is given a course of omeprazole for home. Discussed other supportive care instructions and close follow-up with her primary care doctor. Strict return instructions are also reviewed. She expressed understanding of all discharge instructions and felt comfortable to plan of care.    Lavera Guise, MD 12/23/14 2215

## 2014-12-23 NOTE — ED Notes (Signed)
20 gauge iv removed from left forearm, cath intact, site wnl,

## 2014-12-23 NOTE — ED Notes (Signed)
Pt taken for CT scan 

## 2014-12-23 NOTE — ED Notes (Signed)
Patient c/o vomiting with abd distension. Per patient seen here of 12/7 for sciatica, given diclofenac and baclofen. Per patient abd pain and distension after starting medications and progressively getting worse. Per patient emesis had a pink ting which patient is unsure if it is blood or medications. Patient reports taking tums which has helped a little.

## 2014-12-23 NOTE — Discharge Instructions (Signed)
You likely have stomach upset due to taking too much anti-inflammatory medications. While he still have abdominal pain, please hold taking any of your Naprosyn or diclofenac. He also shows some signs of constipation on your abdomen, and I would recommend start taking some of your MiraLAX as well. You're prescribed omeprazole to help decrease some of the acid secretion within your stomach. Return for worsening symptoms including fevers, vomiting unable to keep down food or fluids, worsening pain, or any other symptoms concerning to you.   Gastritis, Adult Gastritis is soreness and puffiness (inflammation) of the lining of the stomach. If you do not get help, gastritis can cause bleeding and sores (ulcers) in the stomach. HOME CARE   Only take medicine as told by your doctor.  If you were given antibiotic medicines, take them as told. Finish the medicines even if you start to feel better.  Drink enough fluids to keep your pee (urine) clear or pale yellow.  Avoid foods and drinks that make your problems worse. Foods you may want to avoid include:  Caffeine or alcohol.  Chocolate.  Mint.  Garlic and onions.  Spicy foods.  Citrus fruits, including oranges, lemons, or limes.  Food containing tomatoes, including sauce, chili, salsa, and pizza.  Fried and fatty foods.  Eat small meals throughout the day instead of large meals. GET HELP RIGHT AWAY IF:   You have black or dark red poop (stools).  You throw up (vomit) blood. It may look like coffee grounds.  You cannot keep fluids down.  Your belly (abdominal) pain gets worse.  You have a fever.  You do not feel better after 1 week.  You have any other questions or concerns. MAKE SURE YOU:   Understand these instructions.  Will watch your condition.  Will get help right away if you are not doing well or get worse.   This information is not intended to replace advice given to you by your health care provider. Make sure you  discuss any questions you have with your health care provider.   Document Released: 06/17/2007 Document Revised: 03/23/2011 Document Reviewed: 02/11/2011 Elsevier Interactive Patient Education [employer]

## 2014-12-26 ENCOUNTER — Ambulatory Visit: Payer: Self-pay | Admitting: Physician Assistant

## 2014-12-27 ENCOUNTER — Encounter: Payer: Self-pay | Admitting: Physician Assistant

## 2014-12-27 ENCOUNTER — Ambulatory Visit: Payer: Self-pay | Admitting: Physician Assistant

## 2014-12-27 VITALS — BP 142/90 | HR 111 | Temp 98.1°F | Ht 66.0 in | Wt 171.8 lb

## 2014-12-27 DIAGNOSIS — F17210 Nicotine dependence, cigarettes, uncomplicated: Secondary | ICD-10-CM

## 2014-12-27 DIAGNOSIS — E0590 Thyrotoxicosis, unspecified without thyrotoxic crisis or storm: Secondary | ICD-10-CM

## 2014-12-27 DIAGNOSIS — M069 Rheumatoid arthritis, unspecified: Secondary | ICD-10-CM

## 2014-12-27 DIAGNOSIS — K2970 Gastritis, unspecified, without bleeding: Secondary | ICD-10-CM

## 2014-12-27 MED ORDER — PREDNISONE 20 MG PO TABS: 20.0000 mg | ORAL_TABLET | Freq: Every day | ORAL | Status: DC

## 2014-12-27 MED ORDER — PREDNISONE 20 MG PO TABS: 20.0000 mg | ORAL_TABLET | Freq: Two times a day (BID) | ORAL | Status: DC

## 2014-12-27 MED ORDER — OMEPRAZOLE 40 MG PO CPDR: 40.0000 mg | DELAYED_RELEASE_CAPSULE | Freq: Every day | ORAL | Status: DC

## 2014-12-27 NOTE — Progress Notes (Signed)
BP 142/90 mmHg  Pulse 111  Temp(Src) 98.1 F (36.7 C)  Ht 5\' 6"  (1.676 m)  Wt 171 lb 12 oz (77.905 kg)  BMI 27.73 kg/m2  SpO2 98%   Subjective:    Patient ID: , female    DOB: 1965/05/21, 49 y.o.   MRN: [id number]  HPI: [name] is a 49 y.o. female presenting on 12/27/2014 for Rheumatoid Arthritis   HPI Pt's appt at [facility] on 12/19/14 was re-scheduled to tomorrow. Her last Prednisone was Tuesday.  Pt has had no episodes of blood in stool.  Relevant past medical, surgical, family and social history reviewed and updated as indicated. Interim medical history since our last visit reviewed. Allergies and medications reviewed and updated.  Current outpatient prescriptions:  .  albuterol (PROVENTIL HFA;VENTOLIN HFA) 108 (90 BASE) MCG/ACT inhaler, Inhale 2 puffs into the lungs every 6 (six) hours as needed for wheezing or shortness of breath., Disp: , Rfl:  .  Multiple Vitamin (MULTIVITAMIN WITH MINERALS) TABS, Take 1 tablet by mouth daily., Disp: , Rfl:  .  naproxen sodium (ANAPROX) 220 MG tablet, Take 660 mg by mouth 3 (three) times daily with meals., Disp: , Rfl:  .  Omega-3 Fatty Acids (FISH OIL) 1000 MG CAPS, Take 1,000 mg by mouth daily., Disp: , Rfl:  .  predniSONE (DELTASONE) 20 MG tablet, Take 1 tablet (20 mg total) by mouth daily with breakfast. (Patient not taking: Reported on 12/27/2014), Disp: 60 tablet, Rfl: 0   Review of Systems  Constitutional: Positive for diaphoresis, appetite change, fatigue and unexpected weight change. Negative for fever and chills.  HENT: Positive for congestion, dental problem, facial swelling and sore throat. Negative for drooling, ear pain, hearing loss, mouth sores, sneezing, trouble swallowing and voice change.   Eyes: Negative for pain, discharge, redness, itching and visual disturbance.  Respiratory: Positive for cough, shortness of breath and wheezing. Negative for choking.   Cardiovascular: Positive for chest pain and leg  swelling. Negative for palpitations.  Gastrointestinal: Positive for constipation. Negative for vomiting, abdominal pain, diarrhea and blood in stool.  Endocrine: Positive for polydipsia. Negative for cold intolerance and heat intolerance.  Genitourinary: Negative for dysuria, hematuria and decreased urine volume.  Musculoskeletal: Positive for back pain, arthralgias and gait problem.  Skin: Negative for rash.  Allergic/Immunologic: Negative for environmental allergies.  Neurological: Negative for seizures, syncope, light-headedness and headaches.  Hematological: Negative for adenopathy.  Psychiatric/Behavioral: Positive for dysphoric mood and agitation. Negative for suicidal ideas. The patient is nervous/anxious.     Per HPI unless specifically indicated above     Objective:    BP 142/90 mmHg  Pulse 111  Temp(Src) 98.1 F (36.7 C)  Ht 5\' 6"  (1.676 m)  Wt 171 lb 12 oz (77.905 kg)  BMI 27.73 kg/m2  SpO2 98%  Wt Readings from Last 3 Encounters:  12/27/14 171 lb 12 oz (77.905 kg)  12/23/14 172 lb (78.019 kg)  12/19/14 170 lb (77.111 kg)    Physical Exam  Constitutional: She is oriented to person, place, and time. She appears well-developed and well-nourished.  HENT:  Head: Normocephalic and atraumatic.  Neck: Neck supple.  Cardiovascular: Normal rate and regular rhythm.   Pulmonary/Chest: Effort normal and breath sounds normal.  Abdominal: Soft. Bowel sounds are normal. She exhibits no mass. There is no tenderness.  Musculoskeletal: She exhibits no edema.  Lymphadenopathy:    She has no cervical adenopathy.  Neurological: She is alert and oriented to person, place, and  time.  Skin: Skin is warm and dry.  Psychiatric: She has a normal mood and affect. Her behavior is normal.  Vitals reviewed.       Assessment & Plan:   Encounter Diagnoses  Name Primary?  . Rheumatoid arthritis, involving unspecified site, unspecified rheumatoid factor presence (HCC) Yes  . Gastritis    . Hyperthyroidism   . Cigarette nicotine dependence, uncomplicated     Gave one week rx prednisone b/c she needs something for her RA until she gets med from rheumatologist  Order omeprazole from [facility]  Go to Rheumatologist tomorrow as scheduled  F/u 6 wk to make sure pt has seen specialists. RTO sooner prn

## 2014-12-30 ENCOUNTER — Encounter (HOSPITAL_COMMUNITY): Payer: Self-pay | Admitting: Emergency Medicine

## 2014-12-30 ENCOUNTER — Emergency Department (HOSPITAL_COMMUNITY)
Admission: EM | Admit: 2014-12-30 | Discharge: 2014-12-30 | Disposition: A | Discharge status: Home / Self Care | Payer: Self-pay | Attending: Emergency Medicine | Admitting: Emergency Medicine

## 2014-12-30 DIAGNOSIS — R2689 Other abnormalities of gait and mobility: Secondary | ICD-10-CM | POA: Insufficient documentation

## 2014-12-30 DIAGNOSIS — Z8659 Personal history of other mental and behavioral disorders: Secondary | ICD-10-CM | POA: Insufficient documentation

## 2014-12-30 DIAGNOSIS — Z862 Personal history of diseases of the blood and blood-forming organs and certain disorders involving the immune mechanism: Secondary | ICD-10-CM | POA: Insufficient documentation

## 2014-12-30 DIAGNOSIS — F17210 Nicotine dependence, cigarettes, uncomplicated: Secondary | ICD-10-CM | POA: Insufficient documentation

## 2014-12-30 DIAGNOSIS — Z79899 Other long term (current) drug therapy: Secondary | ICD-10-CM | POA: Insufficient documentation

## 2014-12-30 DIAGNOSIS — I10 Essential (primary) hypertension: Secondary | ICD-10-CM | POA: Insufficient documentation

## 2014-12-30 DIAGNOSIS — Z8619 Personal history of other infectious and parasitic diseases: Secondary | ICD-10-CM | POA: Insufficient documentation

## 2014-12-30 DIAGNOSIS — M5431 Sciatica, right side: Secondary | ICD-10-CM | POA: Insufficient documentation

## 2014-12-30 DIAGNOSIS — R200 Anesthesia of skin: Secondary | ICD-10-CM | POA: Insufficient documentation

## 2014-12-30 DIAGNOSIS — J449 Chronic obstructive pulmonary disease, unspecified: Secondary | ICD-10-CM | POA: Insufficient documentation

## 2014-12-30 DIAGNOSIS — G8929 Other chronic pain: Secondary | ICD-10-CM | POA: Insufficient documentation

## 2014-12-30 DIAGNOSIS — R202 Paresthesia of skin: Secondary | ICD-10-CM | POA: Insufficient documentation

## 2014-12-30 DIAGNOSIS — M069 Rheumatoid arthritis, unspecified: Secondary | ICD-10-CM | POA: Insufficient documentation

## 2014-12-30 LAB — URINALYSIS, ROUTINE W REFLEX MICROSCOPIC
Bilirubin Urine: NEGATIVE
Glucose, UA: NEGATIVE mg/dL
Ketones, ur: NEGATIVE mg/dL
Leukocytes, UA: NEGATIVE
NITRITE: NEGATIVE
PROTEIN: NEGATIVE mg/dL
SPECIFIC GRAVITY, URINE: 1.01 (ref 1.005–1.030)
pH: 6.5 (ref 5.0–8.0)

## 2014-12-30 LAB — URINE MICROSCOPIC-ADD ON
BACTERIA UA: NONE SEEN
WBC UA: NONE SEEN WBC/hpf (ref 0–5)

## 2014-12-30 MED ORDER — ONDANSETRON 8 MG PO TBDP
8.0000 mg | ORAL_TABLET | Freq: Once | ORAL | Status: AC
  Administered 2014-12-30: 8 mg via ORAL
  Filled 2014-12-30: qty 1

## 2014-12-30 MED ORDER — HYDROCODONE-ACETAMINOPHEN 5-325 MG PO TABS: 1.0000 | ORAL_TABLET | Freq: Four times a day (QID) | ORAL | Status: DC | PRN

## 2014-12-30 MED ORDER — MORPHINE SULFATE (PF) 4 MG/ML IV SOLN
4.0000 mg | Freq: Once | INTRAVENOUS | Status: AC
  Administered 2014-12-30: 4 mg via INTRAMUSCULAR
  Filled 2014-12-30: qty 1

## 2014-12-30 NOTE — ED Notes (Signed)
Patient c/o lower back pain that radiates into both right and left leg. Patient states pain is sciatic pain in which she has an appointment with Dr Hilda Lias on Wednesday for the pain. Denies any problems with urination or BM.

## 2014-12-30 NOTE — ED Provider Notes (Signed)
CSN: [id number]     Arrival date & time 12/30/14  1329 History  By signing my name below, I, Soijett Blue, attest that this documentation has been prepared under the direction and in the presence of Burgess Amor, PA-C Electronically Signed: Soijett Blue, ED Scribe. 12/30/2014. 3:37 PM.    Chief Complaint  Patient presents with  . Back Pain     The history is provided by the patient. No language interpreter was used.    HPI Comments: [name] is a 49 y.o. female with a medical hx of rheumatoid arthritis and low back pain and sciatica (not felt to be associated with her rheumatoid arthritis per evaluation by her new rheumatologist Dr. Joselyn Glassman at [facility]) presenting with recurrent of her low back pain onset 3 days ago, radiating to her bilateral legs with right leg pain > left leg pain. She states that pain is relieved and worsened with changes in position.  She has an appointment with Dr. Hilda Lias on 01/02/2015 for further evaluation of the sciatic pain. She states that she is having associated symptoms of intermittent numbness/tingling in right foot x 1 week. She states that she has tried Rx medications and heating pad with no relief for her symptoms. She denies weakness in her lower extremities, also denies fevers,chills, urinary or fecal incontinence or retention but has noted increased urinary frequency. She is currently on prednisone,  Is undergoing a long taper and will be starting Humira as soon as she gets medication assistance for this medicine through [place].   Pt PCP: Jacquelin Hawking, PA-C   Past Medical History  Diagnosis Date  . Rheumatoid arthritis(714.0)   . Chronic anemia   . Hypertension   . Arthritis, rheumatoid (HCC)   . Incidental lung nodule   . Depression   . Chronic pain   . COPD (chronic obstructive pulmonary disease) [facility])    Past Surgical History  Procedure Laterality Date  . Tubal ligation    . Endometrial ablation    . Breast cyst excision  12/31/2010    Procedure: CYST EXCISION BREAST;  Surgeon: Fabio Bering, MD;  Location: [facility];  Service: General;  Laterality: Right;  Excision Sebaceous Cyst Right Breast   Family History  Problem Relation Age of Onset  . Anesthesia problems Neg Hx   . Cancer Father    Social History  Substance Use Topics  . Smoking status: Current Every Day Smoker -- 1.00 packs/day for 30 years    Types: Cigarettes  . Smokeless tobacco: Never Used  . Alcohol Use: No   OB History    Gravida Para Term Preterm AB TAB SAB Ectopic Multiple Living   6 5 5  1  1   5      Review of Systems  Constitutional: Negative for fever.  Respiratory: Negative for shortness of breath.   Cardiovascular: Negative for chest pain and leg swelling.  Gastrointestinal: Negative for abdominal pain, constipation and abdominal distention.  Genitourinary: Negative for dysuria, urgency, frequency, flank pain and difficulty urinating.  Musculoskeletal: Positive for back pain and gait problem. Negative for joint swelling.  Skin: Negative for color change, rash and wound.  Neurological: Positive for numbness (right foot). Negative for weakness.       Tingling in right foot      Allergies  Voltaren and Codeine  Home Medications   Prior to Admission medications   Medication Sig Start Date End Date Taking? Authorizing Provider  albuterol (PROVENTIL HFA;VENTOLIN HFA) 108 (90 BASE) MCG/ACT  inhaler Inhale 2 puffs into the lungs every 6 (six) hours as needed for wheezing or shortness of breath.   Yes Historical Provider, MD  Multiple Vitamin (MULTIVITAMIN WITH MINERALS) TABS Take 1 tablet by mouth daily.   Yes Historical Provider, MD  naproxen sodium (ANAPROX) 220 MG tablet Take 660 mg by mouth 3 (three) times daily with meals.   Yes Historical Provider, MD  Omega-3 Fatty Acids (FISH OIL) 1000 MG CAPS Take 1,000 mg by mouth daily.   Yes Historical Provider, MD  predniSONE (DELTASONE) 20 MG tablet Take 1 tablet (20 mg total) by mouth 2 (two)  times daily with a meal. 12/27/14  Yes Jacquelin Hawking, PA-C  HYDROcodone-acetaminophen (NORCO/VICODIN) 5-325 MG tablet Take 1 tablet by mouth every 6 (six) hours as needed. 12/30/14   Burgess Amor, PA-C  omeprazole (PRILOSEC) 40 MG capsule Take 1 capsule (40 mg total) by mouth daily. Patient not taking: Reported on 12/30/2014 12/27/14   Jacquelin Hawking, PA-C  predniSONE (DELTASONE) 20 MG tablet Take 1 tablet (20 mg total) by mouth 2 (two) times daily with a meal. 12/31/14   Jacquelin Hawking, PA-C   BP 168/105 mmHg  Pulse 102  Temp(Src) 98.5 F (36.9 C) (Oral)  Resp 18  Ht 5\' 6"  (1.676 m)  Wt 78.019 kg  BMI 27.77 kg/m2  SpO2 99% Physical Exam  Constitutional: She appears well-developed and well-nourished.  HENT:  Head: Normocephalic.  Eyes: Conjunctivae are normal.  Neck: Normal range of motion. Neck supple.  Cardiovascular: Normal rate and intact distal pulses.   Pedal pulses normal.  Pulmonary/Chest: Effort normal.  Abdominal: Soft. Bowel sounds are normal. She exhibits no distension and no mass.  Musculoskeletal: Normal range of motion. She exhibits no edema.       Lumbar back: She exhibits tenderness. She exhibits no swelling, no edema and no spasm.  Tenderness over her right sciatic notch.   Neurological: She is alert. She has normal strength. She displays no atrophy and no tremor. No sensory deficit. Gait normal.  Reflex Scores:      Patellar reflexes are 2+ on the right side and 2+ on the left side. No strength deficit noted in hip and knee flexor and extensor muscle groups.  Ankle flexion and extension intact. Decrease sensation to touch on right lateral leg. Down-going toes.   Skin: Skin is warm and dry.  Psychiatric: She has a normal mood and affect.  Nursing note and vitals reviewed.   ED Course  Procedures (including critical care time) DIAGNOSTIC STUDIES: Oxygen Saturation is 99% on RA, nl by my interpretation.    COORDINATION OF CARE: 3:37 PM Discussed treatment  plan with pt at bedside which includes UA and pt agreed to plan.    Labs Review Labs Reviewed  URINALYSIS, ROUTINE W REFLEX MICROSCOPIC (NOT AT [facility]) - Abnormal; Notable for the following:    Hgb urine dipstick SMALL (*)    All other components within normal limits  URINE MICROSCOPIC-ADD ON - Abnormal; Notable for the following:    Squamous Epithelial / LPF 0-5 (*)    All other components within normal limits      MDM   Final diagnoses:  Sciatica of right side    Patients  labs reviewed.  Results were also discussed with patient. Plan f/u with [facility]. [facility].  No neuro deficit on exam or by history to suggest emergent or surgical presentation.  discussed worsened sx that should prompt immediate re-evaluation including distal weakness, bowel/bladder retention/incontinence.   I personally  performed the services described in this documentation, which was scribed in my presence. The recorded information has been reviewed and is accurate.   Burgess Amor, PA-C 01/01/15 2124  Glynn Octave, MD 01/02/15 1130

## 2014-12-30 NOTE — ED Notes (Signed)
J. Idol, PA at bedside. 

## 2014-12-30 NOTE — ED Notes (Signed)
Pt verbalized understanding of no driving and to use caution within 4 hours of taking pain meds due to meds cause drowsiness 

## 2014-12-30 NOTE — Discharge Instructions (Signed)
Sciatica °Sciatica is pain, weakness, numbness, or tingling along the path of the sciatic nerve. The nerve starts in the lower back and runs down the back of each leg. The nerve controls the muscles in the lower leg and in the back of the knee, while also providing sensation to the back of the thigh, lower leg, and the sole of your foot. Sciatica is a symptom of another medical condition. For instance, nerve damage or certain conditions, such as a herniated disk or bone spur on the spine, pinch or put pressure on the sciatic nerve. This causes the pain, weakness, or other sensations normally associated with sciatica. Generally, sciatica only affects one side of the body. °CAUSES  °· Herniated or slipped disc. °· Degenerative disk disease. °· A pain disorder involving the narrow muscle in the buttocks (piriformis syndrome). °· Pelvic injury or fracture. °· Pregnancy. °· Tumor (rare). °SYMPTOMS  °Symptoms can vary from mild to very severe. The symptoms usually travel from the low back to the buttocks and down the back of the leg. Symptoms can include: °· Mild tingling or dull aches in the lower back, leg, or hip. °· Numbness in the back of the calf or sole of the foot. °· Burning sensations in the lower back, leg, or hip. °· Sharp pains in the lower back, leg, or hip. °· Leg weakness. °· Severe back pain inhibiting movement. °These symptoms may get worse with coughing, sneezing, laughing, or prolonged sitting or standing. Also, being overweight may worsen symptoms. °DIAGNOSIS  °Your caregiver will perform a physical exam to look for common symptoms of sciatica. He or she may ask you to do certain movements or activities that would trigger sciatic nerve pain. Other tests may be performed to find the cause of the sciatica. These may include: °· Blood tests. °· X-rays. °· Imaging tests, such as an MRI or CT scan. °TREATMENT  °Treatment is directed at the cause of the sciatic pain. Sometimes, treatment is not necessary  and the pain and discomfort goes away on its own. If treatment is needed, your caregiver may suggest: °· Over-the-counter medicines to relieve pain. °· Prescription medicines, such as anti-inflammatory medicine, muscle relaxants, or narcotics. °· Applying heat or ice to the painful area. °· Steroid injections to lessen pain, irritation, and inflammation around the nerve. °· Reducing activity during periods of pain. °· Exercising and stretching to strengthen your abdomen and improve flexibility of your spine. Your caregiver may suggest losing weight if the extra weight makes the back pain worse. °· Physical therapy. °· Surgery to eliminate what is pressing or pinching the nerve, such as a bone spur or part of a herniated disk. °HOME CARE INSTRUCTIONS  °· Only take over-the-counter or prescription medicines for pain or discomfort as directed by your caregiver. °· Apply ice to the affected area for 20 minutes, 3-4 times a day for the first 48-72 hours. Then try heat in the same way. °· Exercise, stretch, or perform your usual activities if these do not aggravate your pain. °· Attend physical therapy sessions as directed by your caregiver. °· Keep all follow-up appointments as directed by your caregiver. °· Do not wear high heels or shoes that do not provide proper support. °· Check your mattress to see if it is too soft. A firm mattress may lessen your pain and discomfort. °SEEK IMMEDIATE MEDICAL CARE IF:  °· You lose control of your bowel or bladder (incontinence). °· You have increasing weakness in the lower back, pelvis, buttocks,   or legs.  You have redness or swelling of your back.  You have a burning sensation when you urinate.  You have pain that gets worse when you lie down or awakens you at night.  Your pain is worse than you have experienced in the past.  Your pain is lasting longer than 4 weeks.  You are suddenly losing weight without reason. MAKE SURE YOU:  Understand these  instructions.  Will watch your condition.  Will get help right away if you are not doing well or get worse.   This information is not intended to replace advice given to you by your health care provider. Make sure you discuss any questions you have with your health care provider.   Document Released: 12/23/2000 Document Revised: 09/19/2014 Document Reviewed: 05/10/2011 Elsevier Interactive Patient Education 2016 [employer].   Keep your appointment with Dr Hilda Lias on Wednesday.  You may take the hydrocodone prescribed for pain relief.  This will make you drowsy - do not drive within 4 hours of taking this medication.

## 2014-12-31 ENCOUNTER — Other Ambulatory Visit: Payer: Self-pay | Admitting: Physician Assistant

## 2014-12-31 MED ORDER — PREDNISONE 20 MG PO TABS: 20.0000 mg | ORAL_TABLET | Freq: Two times a day (BID) | ORAL | Status: DC

## 2015-01-06 ENCOUNTER — Emergency Department (HOSPITAL_COMMUNITY)
Admission: EM | Admit: 2015-01-06 | Discharge: 2015-01-06 | Disposition: A | Discharge status: Home / Self Care | Payer: Self-pay | Attending: Emergency Medicine | Admitting: Emergency Medicine

## 2015-01-06 ENCOUNTER — Encounter (HOSPITAL_COMMUNITY): Payer: Self-pay | Admitting: Emergency Medicine

## 2015-01-06 DIAGNOSIS — Z8659 Personal history of other mental and behavioral disorders: Secondary | ICD-10-CM | POA: Insufficient documentation

## 2015-01-06 DIAGNOSIS — Z79899 Other long term (current) drug therapy: Secondary | ICD-10-CM | POA: Insufficient documentation

## 2015-01-06 DIAGNOSIS — Z7952 Long term (current) use of systemic steroids: Secondary | ICD-10-CM | POA: Insufficient documentation

## 2015-01-06 DIAGNOSIS — I10 Essential (primary) hypertension: Secondary | ICD-10-CM | POA: Insufficient documentation

## 2015-01-06 DIAGNOSIS — Z791 Long term (current) use of non-steroidal anti-inflammatories (NSAID): Secondary | ICD-10-CM | POA: Insufficient documentation

## 2015-01-06 DIAGNOSIS — M069 Rheumatoid arthritis, unspecified: Secondary | ICD-10-CM | POA: Insufficient documentation

## 2015-01-06 DIAGNOSIS — M5431 Sciatica, right side: Secondary | ICD-10-CM | POA: Insufficient documentation

## 2015-01-06 DIAGNOSIS — M5432 Sciatica, left side: Secondary | ICD-10-CM | POA: Insufficient documentation

## 2015-01-06 DIAGNOSIS — J449 Chronic obstructive pulmonary disease, unspecified: Secondary | ICD-10-CM | POA: Insufficient documentation

## 2015-01-06 DIAGNOSIS — G8929 Other chronic pain: Secondary | ICD-10-CM | POA: Insufficient documentation

## 2015-01-06 DIAGNOSIS — Z862 Personal history of diseases of the blood and blood-forming organs and certain disorders involving the immune mechanism: Secondary | ICD-10-CM | POA: Insufficient documentation

## 2015-01-06 MED ORDER — HYDROCODONE-ACETAMINOPHEN 5-325 MG PO TABS: ORAL_TABLET | ORAL | Status: DC

## 2015-01-06 MED ORDER — HYDROCODONE-ACETAMINOPHEN 5-325 MG PO TABS
1.0000 | ORAL_TABLET | Freq: Once | ORAL | Status: AC
  Administered 2015-01-06: 1 via ORAL
  Filled 2015-01-06: qty 1

## 2015-01-06 MED ORDER — CYCLOBENZAPRINE HCL 10 MG PO TABS: 10.0000 mg | ORAL_TABLET | Freq: Three times a day (TID) | ORAL | Status: DC | PRN

## 2015-01-06 NOTE — ED Notes (Signed)
Patient with no complaints at this time. Respirations even and unlabored. Skin warm/dry. Discharge instructions reviewed with patient at this time. Patient given opportunity to voice concerns/ask questions. Patient discharged at this time and left Emergency Department with steady gait.   

## 2015-01-06 NOTE — Discharge Instructions (Signed)
°  Sciatica °Sciatica is pain, weakness, numbness, or tingling along your sciatic nerve. The nerve starts in the lower back and runs down the back of each leg. Nerve damage or certain conditions pinch or put pressure on the sciatic nerve. This causes the pain, weakness, and other discomforts of sciatica. °HOME CARE  °· Only take medicine as told by your doctor. °· Apply ice to the affected area for 20 minutes. Do this 3-4 times a day for the first 48-72 hours. Then try heat in the same way. °· Exercise, stretch, or do your usual activities if these do not make your pain worse. °· Go to physical therapy as told by your doctor. °· Keep all doctor visits as told. °· Do not wear high heels or shoes that are not supportive. °· Get a firm mattress if your mattress is too soft to lessen pain and discomfort. °GET HELP RIGHT AWAY IF:  °· You cannot control when you poop (bowel movement) or pee (urinate). °· You have more weakness in your lower back, lower belly (pelvis), butt (buttocks), or legs. °· You have redness or puffiness (swelling) of your back. °· You have a burning feeling when you pee. °· You have pain that gets worse when you lie down. °· You have pain that wakes you from your sleep. °· Your pain is worse than past pain. °· Your pain lasts longer than 4 weeks. °· You are suddenly losing weight without reason. °MAKE SURE YOU:  °· Understand these instructions. °· Will watch this condition. °· Will get help right away if you are not doing well or get worse. °  °This information is not intended to replace advice given to you by your health care provider. Make sure you discuss any questions you have with your health care provider. °  °Document Released: 10/08/2007 Document Revised: 09/19/2014 Document Reviewed: 05/10/2011 °Elsevier Interactive Patient Education ©2016 Else[employer] Inc[employer] ° ° °

## 2015-01-06 NOTE — ED Provider Notes (Signed)
CSN: [id number]     Arrival date & time 01/06/15  1115 History  By signing my name below, I, Gonzella Lex, attest that this documentation has been prepared under the direction and in the presence of Anglia Blakley, PA-C. Electronically Signed: Gonzella Lex, Scribe. 01/06/2015. 12:02 PM.   Chief Complaint  Patient presents with  . Back Pain    The history is provided by the patient. No language interpreter was used.    HPI Comments: [name] is a 49 y.o. female who presents to the Emergency Department complaining of mild, intermittent back pain which has been ongoing for one month but has recently worsened last night and today after Christmas activites. She describes her pain today as a stabbing back pain which radiates down her right side and causes intermittent cramping. She also notes intermittent numbness and tingling on the top of her right foot and reports that she had difficulty sleeping last night due to her back pain. She also notes a cough with associated nausea. Pt states that she was prescribed voltaren for her back pain earlier this month, which caused her abdominal pain and abdominal bleeding for which she reported to ED on December 11th. Pt is currently taking steroids, methotrexate and folic acid. She denies any recent falls, fever, chills, bowel or bladder incontinence, and abdominal pain.   Past Medical History  Diagnosis Date  . Rheumatoid arthritis(714.0)   . Chronic anemia   . Hypertension   . Arthritis, rheumatoid (HCC)   . Incidental lung nodule   . Depression   . Chronic pain   . COPD (chronic obstructive pulmonary disease) [facility])    Past Surgical History  Procedure Laterality Date  . Tubal ligation    . Endometrial ablation    . Breast cyst excision  12/31/2010    Procedure: CYST EXCISION BREAST;  Surgeon: Fabio Bering, MD;  Location: [facility];  Service: General;  Laterality: Right;  Excision Sebaceous Cyst Right Breast   Family History   Problem Relation Age of Onset  . Anesthesia problems Neg Hx   . Cancer Father    Social History  Substance Use Topics  . Smoking status: Current Every Day Smoker -- 1.00 packs/day for 30 years    Types: Cigarettes  . Smokeless tobacco: Never Used  . Alcohol Use: No   OB History    Gravida Para Term Preterm AB TAB SAB Ectopic Multiple Living   6 5 5  1  1   5      Review of Systems  Constitutional: Negative for fever.  Respiratory: Negative for shortness of breath.   Gastrointestinal: Negative for vomiting, abdominal pain and constipation.  Genitourinary: Negative for dysuria, hematuria, flank pain, decreased urine volume and difficulty urinating.  Musculoskeletal: Positive for back pain. Negative for joint swelling.  Skin: Negative for rash.  Neurological: Negative for weakness and numbness.  All other systems reviewed and are negative.  Allergies  Voltaren and Codeine  Home Medications   Prior to Admission medications   Medication Sig Start Date End Date Taking? Authorizing Provider  albuterol (PROVENTIL HFA;VENTOLIN HFA) 108 (90 BASE) MCG/ACT inhaler Inhale 2 puffs into the lungs every 6 (six) hours as needed for wheezing or shortness of breath.    Historical Provider, MD  HYDROcodone-acetaminophen (NORCO/VICODIN) 5-325 MG tablet Take 1 tablet by mouth every 6 (six) hours as needed. 12/30/14   01/01/15, PA-C  Multiple Vitamin (MULTIVITAMIN WITH MINERALS) TABS Take 1 tablet by mouth daily.  Historical Provider, MD  naproxen sodium (ANAPROX) 220 MG tablet Take 660 mg by mouth 3 (three) times daily with meals.    Historical Provider, MD  Omega-3 Fatty Acids (FISH OIL) 1000 MG CAPS Take 1,000 mg by mouth daily.    Historical Provider, MD  omeprazole (PRILOSEC) 40 MG capsule Take 1 capsule (40 mg total) by mouth daily. Patient not taking: Reported on 12/30/2014 12/27/14   Jacquelin Hawking, PA-C  predniSONE (DELTASONE) 20 MG tablet Take 1 tablet (20 mg total) by mouth 2 (two)  times daily with a meal. 12/27/14   Jacquelin Hawking, PA-C  predniSONE (DELTASONE) 20 MG tablet Take 1 tablet (20 mg total) by mouth 2 (two) times daily with a meal. 12/31/14   Jacquelin Hawking, PA-C   BP 151/93 mmHg  Pulse 93  Temp(Src) 98.9 F (37.2 C) (Oral)  Resp 18  Ht 5\' 6"  (1.676 m)  Wt 172 lb (78.019 kg)  BMI 27.77 kg/m2  SpO2 100% Physical Exam  Constitutional: She is oriented to person, place, and time. She appears well-developed and well-nourished. No distress.  HENT:  Head: Normocephalic and atraumatic.  Eyes: Conjunctivae are normal.  Cardiovascular: Normal rate, regular rhythm and normal heart sounds.   Pulmonary/Chest: Effort normal and breath sounds normal.  Abdominal: Soft. She exhibits no distension. There is no tenderness.  Musculoskeletal: Normal range of motion.  Tenderness to palpation bilateral SI joints.  No spinal tenderness. Distal sensation and DP pulses brisk and symmetrical.   Neurological: She is alert and oriented to person, place, and time. Coordination normal.  Skin: Skin is warm and dry.  Psychiatric: She has a normal mood and affect.  Nursing note and vitals reviewed.  ED Course  Procedures  DIAGNOSTIC STUDIES:    Oxygen Saturation is 100% on RA, normal by my interpretation.   COORDINATION OF CARE:  11:44 AM Will administer pt hydrocodone-acetaminophen in the ED. Will prescribe pt hydrocodone-acetaminophen and flexeril at discharge. Discussed treatment plan with pt at bedside and pt agreed to plan.    MDM   Final diagnoses:  Bilateral sciatica   Pt well appearing, ambulates with a steady gait.  No focal neuro deficits or other concerning sx's for emergent neurological or infectious process.  Pt agrees to close PMD f/u and appears stable for d/c.    I personally performed the services described in this documentation, which was scribed in my presence. The recorded information has been reviewed and is accurate.    01/08/15 2129  2130, MD 01/19/15 1016

## 2015-01-06 NOTE — ED Notes (Signed)
Pt reports ongoing lower back pain. Pt saw MD Hilda Lias on Wednesday and has an MRI scheduled. Pt states, "it is my sciatic nerve acting up again."

## 2015-01-06 NOTE — ED Notes (Signed)
Pt states sharp, shooting pain down both legs, worse in the right with a sharp pain to lower back. This pain began during the night, which patient states she was already awake when pain started. Pt states nothing makes the pain worse and nothing makes it better. Pt is currently on prednisone and methotrexate for RA, along with other medications, per pt.

## 2015-01-07 DIAGNOSIS — K2970 Gastritis, unspecified, without bleeding: Secondary | ICD-10-CM | POA: Insufficient documentation

## 2015-01-17 ENCOUNTER — Other Ambulatory Visit: Payer: Self-pay | Admitting: Physician Assistant

## 2015-01-17 DIAGNOSIS — B1920 Unspecified viral hepatitis C without hepatic coma: Secondary | ICD-10-CM

## 2015-01-24 LAB — HEPATITIS C RNA QUANTITATIVE: HCV QUANT: NOT DETECTED [IU]/mL (ref <15)

## 2015-01-25 ENCOUNTER — Emergency Department (HOSPITAL_COMMUNITY)
Admission: EM | Admit: 2015-01-25 | Discharge: 2015-01-25 | Disposition: A | Discharge status: Home / Self Care | Payer: Self-pay | Attending: Emergency Medicine | Admitting: Emergency Medicine

## 2015-01-25 ENCOUNTER — Encounter (HOSPITAL_COMMUNITY): Payer: Self-pay | Admitting: *Deleted

## 2015-01-25 DIAGNOSIS — W010XXA Fall on same level from slipping, tripping and stumbling without subsequent striking against object, initial encounter: Secondary | ICD-10-CM | POA: Insufficient documentation

## 2015-01-25 DIAGNOSIS — F17210 Nicotine dependence, cigarettes, uncomplicated: Secondary | ICD-10-CM | POA: Insufficient documentation

## 2015-01-25 DIAGNOSIS — M069 Rheumatoid arthritis, unspecified: Secondary | ICD-10-CM | POA: Insufficient documentation

## 2015-01-25 DIAGNOSIS — J449 Chronic obstructive pulmonary disease, unspecified: Secondary | ICD-10-CM | POA: Insufficient documentation

## 2015-01-25 DIAGNOSIS — M5431 Sciatica, right side: Secondary | ICD-10-CM | POA: Insufficient documentation

## 2015-01-25 DIAGNOSIS — Y9389 Activity, other specified: Secondary | ICD-10-CM | POA: Insufficient documentation

## 2015-01-25 DIAGNOSIS — Z791 Long term (current) use of non-steroidal anti-inflammatories (NSAID): Secondary | ICD-10-CM | POA: Insufficient documentation

## 2015-01-25 DIAGNOSIS — I10 Essential (primary) hypertension: Secondary | ICD-10-CM | POA: Insufficient documentation

## 2015-01-25 DIAGNOSIS — Z8659 Personal history of other mental and behavioral disorders: Secondary | ICD-10-CM | POA: Insufficient documentation

## 2015-01-25 DIAGNOSIS — S76911A Strain of unspecified muscles, fascia and tendons at thigh level, right thigh, initial encounter: Secondary | ICD-10-CM

## 2015-01-25 DIAGNOSIS — S76811A Strain of other specified muscles, fascia and tendons at thigh level, right thigh, initial encounter: Secondary | ICD-10-CM | POA: Insufficient documentation

## 2015-01-25 DIAGNOSIS — Z79899 Other long term (current) drug therapy: Secondary | ICD-10-CM | POA: Insufficient documentation

## 2015-01-25 DIAGNOSIS — Z7952 Long term (current) use of systemic steroids: Secondary | ICD-10-CM | POA: Insufficient documentation

## 2015-01-25 DIAGNOSIS — G8929 Other chronic pain: Secondary | ICD-10-CM | POA: Insufficient documentation

## 2015-01-25 DIAGNOSIS — Y998 Other external cause status: Secondary | ICD-10-CM | POA: Insufficient documentation

## 2015-01-25 DIAGNOSIS — Y9289 Other specified places as the place of occurrence of the external cause: Secondary | ICD-10-CM | POA: Insufficient documentation

## 2015-01-25 DIAGNOSIS — Z862 Personal history of diseases of the blood and blood-forming organs and certain disorders involving the immune mechanism: Secondary | ICD-10-CM | POA: Insufficient documentation

## 2015-01-25 MED ORDER — HYDROCODONE-ACETAMINOPHEN 5-325 MG PO TABS
1.0000 | ORAL_TABLET | Freq: Once | ORAL | Status: AC
  Administered 2015-01-25: 1 via ORAL
  Filled 2015-01-25: qty 1

## 2015-01-25 MED ORDER — CYCLOBENZAPRINE HCL 5 MG PO TABS: 5.0000 mg | ORAL_TABLET | Freq: Three times a day (TID) | ORAL | Status: DC | PRN

## 2015-01-25 MED ORDER — HYDROCODONE-ACETAMINOPHEN 5-325 MG PO TABS: 1.0000 | ORAL_TABLET | ORAL | Status: DC | PRN

## 2015-01-25 NOTE — Discharge Instructions (Signed)
Muscle Strain A muscle strain is an injury that occurs when a muscle is stretched beyond its normal length. Usually a small number of muscle fibers are torn when this happens. Muscle strain is rated in degrees. First-degree strains have the least amount of muscle fiber tearing and pain. Second-degree and third-degree strains have increasingly more tearing and pain.  Usually, recovery from muscle strain takes 1-2 weeks. Complete healing takes 5-6 weeks.  CAUSES  Muscle strain happens when a sudden, violent force placed on a muscle stretches it too far. This may occur with lifting, sports, or a fall.  RISK FACTORS Muscle strain is especially common in athletes.  SIGNS AND SYMPTOMS At the site of the muscle strain, there may be:  Pain.  Bruising.  Swelling.  Difficulty using the muscle due to pain or lack of normal function. DIAGNOSIS  Your health care provider will perform a physical exam and ask about your medical history. TREATMENT  Often, the best treatment for a muscle strain is resting, icing, and applying cold compresses to the injured area.  HOME CARE INSTRUCTIONS   Use the PRICE method of treatment to promote muscle healing during the first 2-3 days after your injury. The PRICE method involves:  Protecting the muscle from being injured again.  Restricting your activity and resting the injured body part.  Icing your injury. To do this, put ice in a plastic bag. Place a towel between your skin and the bag. Then, apply the ice and leave it on from 15-20 minutes each hour. After the third day, switch to moist heat packs.  Apply compression to the injured area with a splint or elastic bandage. Be careful not to wrap it too tightly. This may interfere with blood circulation or increase swelling.  Elevate the injured body part above the level of your heart as often as you can.  Only take over-the-counter or prescription medicines for pain, discomfort, or fever as directed by your  health care provider.  Warming up prior to exercise helps to prevent future muscle strains. SEEK MEDICAL CARE IF:   You have increasing pain or swelling in the injured area.  You have numbness, tingling, or a significant loss of strength in the injured area. MAKE SURE YOU:   Understand these instructions.  Will watch your condition.  Will get help right away if you are not doing well or get worse.   This information is not intended to replace advice given to you by your health care provider. Make sure you discuss any questions you have with your health care provider.   Document Released: 12/29/2004 Document Revised: 10/19/2012 Document Reviewed: 07/28/2012 Elsevier Interactive Patient Education 2016 [employer]  Sciatica Sciatica is pain, weakness, numbness, or tingling along the path of the sciatic nerve. The nerve starts in the lower back and runs down the back of each leg. The nerve controls the muscles in the lower leg and in the back of the knee, while also providing sensation to the back of the thigh, lower leg, and the sole of your foot. Sciatica is a symptom of another medical condition. For instance, nerve damage or certain conditions, such as a herniated disk or bone spur on the spine, pinch or put pressure on the sciatic nerve. This causes the pain, weakness, or other sensations normally associated with sciatica. Generally, sciatica only affects one side of the body. CAUSES   Herniated or slipped disc.  Degenerative disk disease.  A pain disorder involving the narrow muscle in the  buttocks (piriformis syndrome).  Pelvic injury or fracture.  Pregnancy.  Tumor (rare). SYMPTOMS  Symptoms can vary from mild to very severe. The symptoms usually travel from the low back to the buttocks and down the back of the leg. Symptoms can include:  Mild tingling or dull aches in the lower back, leg, or hip.  Numbness in the back of the calf or sole of the foot.  Burning  sensations in the lower back, leg, or hip.  Sharp pains in the lower back, leg, or hip.  Leg weakness.  Severe back pain inhibiting movement. These symptoms may get worse with coughing, sneezing, laughing, or prolonged sitting or standing. Also, being overweight may worsen symptoms. DIAGNOSIS  Your caregiver will perform a physical exam to look for common symptoms of sciatica. He or she may ask you to do certain movements or activities that would trigger sciatic nerve pain. Other tests may be performed to find the cause of the sciatica. These may include:  Blood tests.  X-rays.  Imaging tests, such as an MRI or CT scan. TREATMENT  Treatment is directed at the cause of the sciatic pain. Sometimes, treatment is not necessary and the pain and discomfort goes away on its own. If treatment is needed, your caregiver may suggest:  Over-the-counter medicines to relieve pain.  Prescription medicines, such as anti-inflammatory medicine, muscle relaxants, or narcotics.  Applying heat or ice to the painful area.  Steroid injections to lessen pain, irritation, and inflammation around the nerve.  Reducing activity during periods of pain.  Exercising and stretching to strengthen your abdomen and improve flexibility of your spine. Your caregiver may suggest losing weight if the extra weight makes the back pain worse.  Physical therapy.  Surgery to eliminate what is pressing or pinching the nerve, such as a bone spur or part of a herniated disk. HOME CARE INSTRUCTIONS   Only take over-the-counter or prescription medicines for pain or discomfort as directed by your caregiver.  Apply ice to the affected area for 20 minutes, 3-4 times a day for the first 48-72 hours. Then try heat in the same way.  Exercise, stretch, or perform your usual activities if these do not aggravate your pain.  Attend physical therapy sessions as directed by your caregiver.  Keep all follow-up appointments as  directed by your caregiver.  Do not wear high heels or shoes that do not provide proper support.  Check your mattress to see if it is too soft. A firm mattress may lessen your pain and discomfort. SEEK IMMEDIATE MEDICAL CARE IF:   You lose control of your bowel or bladder (incontinence).  You have increasing weakness in the lower back, pelvis, buttocks, or legs.  You have redness or swelling of your back.  You have a burning sensation when you urinate.  You have pain that gets worse when you lie down or awakens you at night.  Your pain is worse than you have experienced in the past.  Your pain is lasting longer than 4 weeks.  You are suddenly losing weight without reason. MAKE SURE YOU:  Understand these instructions.  Will watch your condition.  Will get help right away if you are not doing well or get worse.   This information is not intended to replace advice given to you by your health care provider. Make sure you discuss any questions you have with your health care provider.   Document Released: 12/23/2000 Document Revised: 09/19/2014 Document Reviewed: 05/10/2011 Elsevier Interactive Patient Education 2016  Elsevier Inc. ° °

## 2015-01-25 NOTE — ED Notes (Signed)
Pt seen and evaluated by EDPa for initial assessment. 

## 2015-01-25 NOTE — ED Notes (Signed)
Pt slipped an fell this morning after checking the mail. Pt states lower back/buttock pain with pain radiating  Down both legs. NAD.

## 2015-01-28 NOTE — ED Provider Notes (Signed)
CSN: [id number]     Arrival date & time 01/25/15  1537 History   First MD Initiated Contact with Patient 01/25/15 1721     Chief Complaint  Patient presents with  . Fall     (Consider location/radiation/quality/duration/timing/severity/associated sxs/prior Treatment) The history is provided by the patient.   [name] is a 50 y.o. female well known to this emergency department presenting with acute on chronic low back pain with worsened pain and radiation into bilateral distal posterior thighs since slipping this am.  She denies fall, but states her left foot planted, her right foot slipped causing a near "splits" like injury, but caught herself on the porch rails before falling completely. There has been no weakness or numbness in the lower extremities and no urinary or bowel retention or incontinence.  Patient does not have a history of cancer or IVDU.  The patient is currently on a long prednisone taper which she was taking for her rheumatoid arthritis and was just started on Humira and endorses improvement in her chronic rheumatoid sx.     Past Medical History  Diagnosis Date  . Rheumatoid arthritis(714.0)   . Chronic anemia   . Hypertension   . Arthritis, rheumatoid (HCC)   . Incidental lung nodule   . Depression   . Chronic pain   . COPD (chronic obstructive pulmonary disease) [facility])    Past Surgical History  Procedure Laterality Date  . Tubal ligation    . Endometrial ablation    . Breast cyst excision  12/31/2010    Procedure: CYST EXCISION BREAST;  Surgeon: Fabio Bering, MD;  Location: [facility];  Service: General;  Laterality: Right;  Excision Sebaceous Cyst Right Breast   Family History  Problem Relation Age of Onset  . Anesthesia problems Neg Hx   . Cancer Father    Social History  Substance Use Topics  . Smoking status: Current Every Day Smoker -- 1.00 packs/day for 30 years    Types: Cigarettes  . Smokeless tobacco: Never Used  . Alcohol Use: No   OB  History    Gravida Para Term Preterm AB TAB SAB Ectopic Multiple Living   6 5 5  1  1   5      Review of Systems  Constitutional: Negative for fever.  Respiratory: Negative for shortness of breath.   Cardiovascular: Negative for chest pain and leg swelling.  Gastrointestinal: Negative for abdominal pain, constipation and abdominal distention.  Genitourinary: Negative for dysuria, urgency, frequency, flank pain and difficulty urinating.  Musculoskeletal: Positive for back pain. Negative for joint swelling and gait problem.  Skin: Negative for rash.  Neurological: Negative for weakness and numbness.      Allergies  Voltaren and Codeine  Home Medications   Prior to Admission medications   Medication Sig Start Date End Date Taking? Authorizing Provider  albuterol (PROVENTIL HFA;VENTOLIN HFA) 108 (90 BASE) MCG/ACT inhaler Inhale 2 puffs into the lungs every 6 (six) hours as needed for wheezing or shortness of breath.    Historical Provider, MD  cyclobenzaprine (FLEXERIL) 5 MG tablet Take 1 tablet (5 mg total) by mouth 3 (three) times daily as needed for muscle spasms. 01/25/15   01/27/15, PA-C  HYDROcodone-acetaminophen (NORCO/VICODIN) 5-325 MG tablet Take 1 tablet by mouth every 4 (four) hours as needed. 01/25/15   01/27/15, PA-C  Multiple Vitamin (MULTIVITAMIN WITH MINERALS) TABS Take 1 tablet by mouth daily.    Historical Provider, MD  naproxen sodium (ANAPROX) 220 MG  tablet Take 660 mg by mouth 3 (three) times daily with meals.    Historical Provider, MD  Omega-3 Fatty Acids (FISH OIL) 1000 MG CAPS Take 1,000 mg by mouth daily.    Historical Provider, MD  omeprazole (PRILOSEC) 40 MG capsule Take 1 capsule (40 mg total) by mouth daily. Patient not taking: Reported on 12/30/2014 12/27/14   Jacquelin Hawking, PA-C  predniSONE (DELTASONE) 20 MG tablet Take 1 tablet (20 mg total) by mouth 2 (two) times daily with a meal. 12/27/14   Jacquelin Hawking, PA-C  predniSONE (DELTASONE) 20 MG tablet  Take 1 tablet (20 mg total) by mouth 2 (two) times daily with a meal. 12/31/14   Jacquelin Hawking, PA-C   BP 144/100 mmHg  Pulse 99  Temp(Src) 98.2 F (36.8 C) (Oral)  Resp 18  Ht 5\' 6"  (1.676 m)  Wt 77.111 kg  BMI 27.45 kg/m2  SpO2 99% Physical Exam  Constitutional: She appears well-developed and well-nourished.  HENT:  Head: Normocephalic.  Eyes: Conjunctivae are normal.  Neck: Normal range of motion. Neck supple.  Cardiovascular: Normal rate and intact distal pulses.   Pedal pulses normal.  Pulmonary/Chest: Effort normal.  Abdominal: Soft. Bowel sounds are normal. She exhibits no distension and no mass.  Musculoskeletal: Normal range of motion. She exhibits no edema.       Lumbar back: She exhibits tenderness. She exhibits no swelling, no edema and no spasm.  paralumbar ttp, no midline pain   Neurological: She is alert. She has normal strength. She displays no atrophy and no tremor. No sensory deficit. Gait normal.  Reflex Scores:      Patellar reflexes are 2+ on the right side and 2+ on the left side.      Achilles reflexes are 2+ on the right side and 2+ on the left side. No strength deficit noted in hip and knee flexor and extensor muscle groups.  Ankle flexion and extension intact.  Skin: Skin is warm and dry.  Psychiatric: She has a normal mood and affect.  Nursing note and vitals reviewed.   ED Course  Procedures (including critical care time) Labs Review Labs Reviewed - No data to display  Imaging Review No results found. I have personally reviewed and evaluated these images and lab results as part of my medical decision-making.   EKG Interpretation None      MDM   Final diagnoses:  Sciatica of right side  Muscle strain of thigh, right, initial encounter    Advised to continue other meds, added flexeril, short course of hydrocodone.  No neuro deficit on exam or by history to suggest emergent or surgical presentation.  Also discussed worsened sx that  should prompt immediate re-evaluation including distal weakness, bowel/bladder retention/incontinence.          , PA-C 01/28/15 1601  01/30/15, MD 01/31/15 [phone]

## 2015-01-31 ENCOUNTER — Encounter: Payer: Self-pay | Admitting: Physician Assistant

## 2015-01-31 ENCOUNTER — Encounter (HOSPITAL_COMMUNITY): Payer: Self-pay | Admitting: Emergency Medicine

## 2015-01-31 ENCOUNTER — Emergency Department (HOSPITAL_COMMUNITY)
Admission: EM | Admit: 2015-01-31 | Discharge: 2015-01-31 | Disposition: A | Discharge status: Home / Self Care | Payer: Self-pay | Attending: Emergency Medicine | Admitting: Emergency Medicine

## 2015-01-31 ENCOUNTER — Ambulatory Visit: Payer: Self-pay | Admitting: Physician Assistant

## 2015-01-31 VITALS — BP 146/90 | HR 96 | Temp 97.9°F | Ht 66.0 in | Wt 167.8 lb

## 2015-01-31 DIAGNOSIS — G8929 Other chronic pain: Secondary | ICD-10-CM | POA: Insufficient documentation

## 2015-01-31 DIAGNOSIS — I10 Essential (primary) hypertension: Secondary | ICD-10-CM | POA: Insufficient documentation

## 2015-01-31 DIAGNOSIS — D649 Anemia, unspecified: Secondary | ICD-10-CM | POA: Insufficient documentation

## 2015-01-31 DIAGNOSIS — F17210 Nicotine dependence, cigarettes, uncomplicated: Secondary | ICD-10-CM

## 2015-01-31 DIAGNOSIS — Z8659 Personal history of other mental and behavioral disorders: Secondary | ICD-10-CM | POA: Insufficient documentation

## 2015-01-31 DIAGNOSIS — M069 Rheumatoid arthritis, unspecified: Secondary | ICD-10-CM

## 2015-01-31 DIAGNOSIS — J449 Chronic obstructive pulmonary disease, unspecified: Secondary | ICD-10-CM | POA: Insufficient documentation

## 2015-01-31 DIAGNOSIS — Z7952 Long term (current) use of systemic steroids: Secondary | ICD-10-CM | POA: Insufficient documentation

## 2015-01-31 DIAGNOSIS — M5442 Lumbago with sciatica, left side: Secondary | ICD-10-CM | POA: Insufficient documentation

## 2015-01-31 DIAGNOSIS — Z79899 Other long term (current) drug therapy: Secondary | ICD-10-CM | POA: Insufficient documentation

## 2015-01-31 DIAGNOSIS — K219 Gastro-esophageal reflux disease without esophagitis: Secondary | ICD-10-CM

## 2015-01-31 DIAGNOSIS — E0590 Thyrotoxicosis, unspecified without thyrotoxic crisis or storm: Secondary | ICD-10-CM

## 2015-01-31 MED ORDER — HYDROCODONE-ACETAMINOPHEN 5-325 MG PO TABS: ORAL_TABLET | ORAL | Status: DC

## 2015-01-31 NOTE — Discharge Instructions (Signed)
°  Sciatica °Sciatica is pain, weakness, numbness, or tingling along your sciatic nerve. The nerve starts in the lower back and runs down the back of each leg. Nerve damage or certain conditions pinch or put pressure on the sciatic nerve. This causes the pain, weakness, and other discomforts of sciatica. °HOME CARE  °· Only take medicine as told by your doctor. °· Apply ice to the affected area for 20 minutes. Do this 3-4 times a day for the first 48-72 hours. Then try heat in the same way. °· Exercise, stretch, or do your usual activities if these do not make your pain worse. °· Go to physical therapy as told by your doctor. °· Keep all doctor visits as told. °· Do not wear high heels or shoes that are not supportive. °· Get a firm mattress if your mattress is too soft to lessen pain and discomfort. °GET HELP RIGHT AWAY IF:  °· You cannot control when you poop (bowel movement) or pee (urinate). °· You have more weakness in your lower back, lower belly (pelvis), butt (buttocks), or legs. °· You have redness or puffiness (swelling) of your back. °· You have a burning feeling when you pee. °· You have pain that gets worse when you lie down. °· You have pain that wakes you from your sleep. °· Your pain is worse than past pain. °· Your pain lasts longer than 4 weeks. °· You are suddenly losing weight without reason. °MAKE SURE YOU:  °· Understand these instructions. °· Will watch this condition. °· Will get help right away if you are not doing well or get worse. °  °This information is not intended to replace advice given to you by your health care provider. Make sure you discuss any questions you have with your health care provider. °  °Document Released: 10/08/2007 Document Revised: 09/19/2014 Document Reviewed: 05/10/2011 °Elsevier Interactive Patient Education ©2016 Else[employer] Inc[employer] ° ° °

## 2015-01-31 NOTE — Progress Notes (Signed)
BP 146/90 mmHg  Pulse 96  Temp(Src) 97.9 F (36.6 C)  Ht 5\' 6"  (1.676 m)  Wt 167 lb 12.8 oz (76.114 kg)  BMI 27.10 kg/m2  SpO2 97%   Subjective:    Patient ID: , female    DOB: Dec 27, 1965, 50 y.o.   MRN: [id number]  HPI: [name] is a 50 y.o. female presenting on 01/31/2015 for Rheumatoid Arthritis and GI Problem   HPI   -gerd good now- denies symptoms.  She is on omeprazole -pt is now seeing Rheumatologist at [facility] and getting medication from that facility.  She says she is feeling better.  Relevant past medical, surgical, family and social history reviewed and updated as indicated. Interim medical history since our last visit reviewed. Allergies and medications reviewed and updated.  Current outpatient prescriptions:  .  albuterol (PROVENTIL HFA;VENTOLIN HFA) 108 (90 BASE) MCG/ACT inhaler, Inhale 2 puffs into the lungs every 6 (six) hours as needed for wheezing or shortness of breath., Disp: , Rfl:  .  alendronate (FOSAMAX) 70 MG tablet, Take 70 mg by mouth once a week. Take with a full glass of water on an empty stomach., Disp: , Rfl:  .  cyclobenzaprine (FLEXERIL) 5 MG tablet, Take 1 tablet (5 mg total) by mouth 3 (three) times daily as needed for muscle spasms., Disp: 30 tablet, Rfl: 0 .  folic acid (FOLVITE) 1 MG tablet, Take 1 mg by mouth daily., Disp: , Rfl:  .  methotrexate (RHEUMATREX) 2.5 MG tablet, Take 15 mg by mouth once a week. Caution:Chemotherapy. Protect from light., Disp: , Rfl:  .  Multiple Vitamin (MULTIVITAMIN WITH MINERALS) TABS, Take 1 tablet by mouth daily., Disp: , Rfl:  .  Omega-3 Fatty Acids (FISH OIL) 1000 MG CAPS, Take 1,000 mg by mouth daily., Disp: , Rfl:  .  omeprazole (PRILOSEC) 40 MG capsule, Take 1 capsule (40 mg total) by mouth daily., Disp: 90 capsule, Rfl: 1 .  predniSONE (DELTASONE) 20 MG tablet, Take 1 tablet (20 mg total) by mouth 2 (two) times daily with a meal. (Patient taking differently: Take 10 mg by mouth. 15mg  q am  and 10mg  in afternoon), Disp: 14 tablet, Rfl: 0   Review of Systems  Constitutional: Positive for diaphoresis, appetite change and fatigue. Negative for fever, chills and unexpected weight change.  HENT: Positive for congestion, dental problem, ear pain and facial swelling. Negative for drooling, hearing loss, mouth sores, sneezing, sore throat, trouble swallowing and voice change.   Eyes: Negative for pain, discharge, redness, itching and visual disturbance.  Respiratory: Positive for cough, chest tightness, shortness of breath and wheezing. Negative for choking.   Cardiovascular: Positive for leg swelling. Negative for chest pain and palpitations.  Gastrointestinal: Negative for vomiting, abdominal pain, diarrhea, constipation and blood in stool.  Endocrine: Positive for cold intolerance and polydipsia. Negative for heat intolerance.  Genitourinary: Positive for decreased urine volume. Negative for dysuria and hematuria.  Musculoskeletal: Positive for back pain, arthralgias and gait problem.  Skin: Negative for rash.  Allergic/Immunologic: Negative for environmental allergies.  Neurological: Positive for headaches. Negative for seizures, syncope and light-headedness.  Hematological: Negative for adenopathy.  Psychiatric/Behavioral: Positive for dysphoric mood and agitation. Negative for suicidal ideas. The patient is nervous/anxious.     Per HPI unless specifically indicated above     Objective:    BP 146/90 mmHg  Pulse 96  Temp(Src) 97.9 F (36.6 C)  Ht 5\' 6"  (1.676 m)  Wt 167 lb 12.8 oz (  76.114 kg)  BMI 27.10 kg/m2  SpO2 97%  Wt Readings from Last 3 Encounters:  01/31/15 167 lb 12.8 oz (76.114 kg)  01/25/15 170 lb (77.111 kg)  01/06/15 172 lb (78.019 kg)    Physical Exam  Constitutional: She is oriented to person, place, and time. She appears well-developed and well-nourished.  HENT:  Head: Normocephalic and atraumatic.  Neck: Neck supple.  Cardiovascular: Normal rate  and regular rhythm.   Pulmonary/Chest: Effort normal and breath sounds normal.  Abdominal: Soft. Bowel sounds are normal. She exhibits no mass. There is no tenderness.  Musculoskeletal: She exhibits no edema.  Lymphadenopathy:    She has no cervical adenopathy.  Neurological: She is alert and oriented to person, place, and time.  Skin: Skin is warm and dry.  Psychiatric: She has a normal mood and affect. Her behavior is normal.  Vitals reviewed.   Results for orders placed or performed in visit on 01/17/15  Hepatitis C RNA quantitative  Result Value Ref Range   HCV Quantitative Not Detected <15 IU/mL   HCV Quantitative Log NOT CALC <1.18 log 10      Assessment & Plan:   Encounter Diagnoses  Name Primary?  . Rheumatoid arthritis, involving unspecified site, unspecified rheumatoid factor presence (HCC) Yes  . Gastroesophageal reflux disease, esophagitis presence not specified   . Cigarette nicotine dependence, uncomplicated   . Chronic pain   . Hyperthyroidism     -reviewed results of hepatitis test with pt -TSH 0.173 on 09/03/14-  Pt has still not seen endocrine yet -bp is a bit high today.  Will monitor -pt to continue with RA treatment at [facility] -f/u OV 3 months.  RTO sooner prn

## 2015-01-31 NOTE — ED Notes (Signed)
Pt states that she has been having lower back pain for over a month.  Is being treated with prednisone by pcp for this and it is not helping.  Went to pcp today and forgot to mention the back pain issue.

## 2015-01-31 NOTE — ED Provider Notes (Signed)
CSN: [id number]     Arrival date & time 01/31/15  1519 History   First MD Initiated Contact with Patient 01/31/15 1609     Chief Complaint  Patient presents with  . Back Pain     (Consider location/radiation/quality/duration/timing/severity/associated sxs/prior Treatment) HPI   [name] is a 50 y.o. female who presents to the Emergency Department complaining of left sided low back pain for one month.  She was recently seen here for right sided low back pain and now states the pain "has shifted tot he left"  She believes the pain has been triggered by her antalgic gait.  She is currently taking prednisone for her RA and states that she is tapering down on the medication, but each time she lowers her dose, she has increased pain.  She states that she was seen by her PMD before ED arrival , and was told to come here for pain control.  She denies injury, fever, chills, swelling of the joints, numbness or weakness of the lower extremities and urine or bowel changes.    Past Medical History  Diagnosis Date  . Rheumatoid arthritis(714.0)   . Chronic anemia   . Hypertension   . Arthritis, rheumatoid (HCC)   . Incidental lung nodule   . Depression   . Chronic pain   . COPD (chronic obstructive pulmonary disease) [facility])    Past Surgical History  Procedure Laterality Date  . Tubal ligation    . Endometrial ablation    . Breast cyst excision  12/31/2010    Procedure: CYST EXCISION BREAST;  Surgeon: Fabio Bering, MD;  Location: [facility];  Service: General;  Laterality: Right;  Excision Sebaceous Cyst Right Breast   Family History  Problem Relation Age of Onset  . Anesthesia problems Neg Hx   . Cancer Father    Social History  Substance Use Topics  . Smoking status: Current Every Day Smoker -- 1.00 packs/day for 30 years    Types: Cigarettes  . Smokeless tobacco: Never Used     Comment: has cut back  . Alcohol Use: No   OB History    Gravida Para Term Preterm AB TAB SAB Ectopic  Multiple Living   6 5 5  1  1   5      Review of Systems  Constitutional: Negative for fever.  Respiratory: Negative for shortness of breath.   Gastrointestinal: Negative for vomiting and abdominal pain.  Genitourinary: Negative for dysuria, hematuria, flank pain, decreased urine volume and difficulty urinating.  Musculoskeletal: Positive for back pain. Negative for joint swelling.  Skin: Negative for rash.  Neurological: Negative for weakness and numbness.  All other systems reviewed and are negative.     Allergies  Voltaren and Codeine  Home Medications   Prior to Admission medications   Medication Sig Start Date End Date Taking? Authorizing Provider  albuterol (PROVENTIL HFA;VENTOLIN HFA) 108 (90 BASE) MCG/ACT inhaler Inhale 2 puffs into the lungs every 6 (six) hours as needed for wheezing or shortness of breath.    Historical Provider, MD  alendronate (FOSAMAX) 70 MG tablet Take 70 mg by mouth once a week. Take with a full glass of water on an empty stomach.(Monday)    Historical Provider, MD  cyclobenzaprine (FLEXERIL) 5 MG tablet Take 1 tablet (5 mg total) by mouth 3 (three) times daily as needed for muscle spasms. 01/25/15   01/27/15, PA-C  folic acid (FOLVITE) 1 MG tablet Take 1 mg by mouth daily.  Historical Provider, MD  methotrexate (RHEUMATREX) 2.5 MG tablet Take 15 mg by mouth once a week. Caution:Chemotherapy. Protect from light.(Thursday)    Historical Provider, MD  Multiple Vitamin (MULTIVITAMIN WITH MINERALS) TABS Take 1 tablet by mouth daily.    Historical Provider, MD  Omega-3 Fatty Acids (FISH OIL) 1000 MG CAPS Take 1,000 mg by mouth daily.    Historical Provider, MD  omeprazole (PRILOSEC) 40 MG capsule Take 1 capsule (40 mg total) by mouth daily. 12/27/14   Jacquelin Hawking, PA-C  predniSONE (DELTASONE) 20 MG tablet Take 1 tablet (20 mg total) by mouth 2 (two) times daily with a meal. Patient taking differently: Take 10 mg by mouth. 15mg  q am and 10mg  in  afternoon 12/27/14   , PA-C   BP 162/95 mmHg  Pulse 102  Temp(Src) 97.3 F (36.3 C) (Oral)  Resp 20  Ht 5\' 6"  (1.676 m)  Wt 24.069 kg  BMI 8.57 kg/m2  SpO2 100% Physical Exam  Constitutional: She is oriented to person, place, and time. She appears well-developed and well-nourished. No distress.  HENT:  Head: Normocephalic and atraumatic.  Neck: Normal range of motion. Neck supple.  Cardiovascular: Normal rate, regular rhythm, normal heart sounds and intact distal pulses.   No murmur heard. Pulmonary/Chest: Effort normal and breath sounds normal. No respiratory distress.  Abdominal: Soft. She exhibits no distension. There is no tenderness.  Musculoskeletal: She exhibits tenderness. She exhibits no edema.       Lumbar back: She exhibits tenderness and pain. She exhibits normal range of motion, no swelling, no deformity, no laceration and normal pulse.  ttp of the left lower lumbar paraspinal muscles and SI joint.  No spinal tenderness.  DP pulses are brisk and symmetrical.  Distal sensation intact.   Pt has 5/5 strength against resistance of bilateral lower extremities.     Neurological: She is alert and oriented to person, place, and time. She has normal strength. No sensory deficit. She exhibits normal muscle tone. Coordination and gait normal.  Reflex Scores:      Patellar reflexes are 2+ on the right side and 2+ on the left side.      Achilles reflexes are 2+ on the right side and 2+ on the left side. Skin: Skin is warm and dry. No rash noted.  Nursing note and vitals reviewed.   ED Course  Procedures (including critical care time) Labs Review Labs Reviewed - No data to display  Imaging Review No results found. I have personally reviewed and evaluated these images and lab results as part of my medical decision-making.   EKG Interpretation None      MDM   Final diagnoses:  Left-sided low back pain with left-sided sciatica    Pt was seen by her PMD  today.  Pt stated that she was instructed to come to ED for pain control.  I have attempted to contact their office, but office had already closed.  She has appt with pain management on Jan 25 at [facility].  No concerning sx's for emergent neurological or infectious process.  Non-toxic appearing    , PA-C 02/03/15 1944  [facility], MD 02/04/15 [phone]

## 2015-02-04 DIAGNOSIS — K219 Gastro-esophageal reflux disease without esophagitis: Secondary | ICD-10-CM | POA: Insufficient documentation

## 2015-02-04 DIAGNOSIS — G8929 Other chronic pain: Secondary | ICD-10-CM | POA: Insufficient documentation

## 2015-02-04 DIAGNOSIS — E0590 Thyrotoxicosis, unspecified without thyrotoxic crisis or storm: Secondary | ICD-10-CM | POA: Insufficient documentation

## 2015-02-05 ENCOUNTER — Ambulatory Visit: Payer: Self-pay | Admitting: Physician Assistant

## 2015-02-06 ENCOUNTER — Encounter: Payer: Self-pay | Admitting: Physician Assistant

## 2015-02-07 ENCOUNTER — Emergency Department (HOSPITAL_COMMUNITY)
Admission: EM | Admit: 2015-02-07 | Discharge: 2015-02-07 | Disposition: A | Discharge status: Home / Self Care | Payer: Self-pay | Attending: Emergency Medicine | Admitting: Emergency Medicine

## 2015-02-07 ENCOUNTER — Emergency Department (HOSPITAL_COMMUNITY): Payer: Self-pay

## 2015-02-07 ENCOUNTER — Encounter (HOSPITAL_COMMUNITY): Payer: Self-pay | Admitting: *Deleted

## 2015-02-07 DIAGNOSIS — I10 Essential (primary) hypertension: Secondary | ICD-10-CM | POA: Insufficient documentation

## 2015-02-07 DIAGNOSIS — F17210 Nicotine dependence, cigarettes, uncomplicated: Secondary | ICD-10-CM | POA: Insufficient documentation

## 2015-02-07 DIAGNOSIS — Z9851 Tubal ligation status: Secondary | ICD-10-CM | POA: Insufficient documentation

## 2015-02-07 DIAGNOSIS — R110 Nausea: Secondary | ICD-10-CM | POA: Insufficient documentation

## 2015-02-07 DIAGNOSIS — M8448XA Pathological fracture, other site, initial encounter for fracture: Secondary | ICD-10-CM | POA: Insufficient documentation

## 2015-02-07 DIAGNOSIS — J449 Chronic obstructive pulmonary disease, unspecified: Secondary | ICD-10-CM | POA: Insufficient documentation

## 2015-02-07 DIAGNOSIS — R140 Abdominal distension (gaseous): Secondary | ICD-10-CM | POA: Insufficient documentation

## 2015-02-07 DIAGNOSIS — R197 Diarrhea, unspecified: Secondary | ICD-10-CM | POA: Insufficient documentation

## 2015-02-07 DIAGNOSIS — Z79899 Other long term (current) drug therapy: Secondary | ICD-10-CM | POA: Insufficient documentation

## 2015-02-07 DIAGNOSIS — R109 Unspecified abdominal pain: Secondary | ICD-10-CM | POA: Insufficient documentation

## 2015-02-07 DIAGNOSIS — G8929 Other chronic pain: Secondary | ICD-10-CM | POA: Insufficient documentation

## 2015-02-07 DIAGNOSIS — M069 Rheumatoid arthritis, unspecified: Secondary | ICD-10-CM | POA: Insufficient documentation

## 2015-02-07 DIAGNOSIS — S3210XA Unspecified fracture of sacrum, initial encounter for closed fracture: Secondary | ICD-10-CM

## 2015-02-07 DIAGNOSIS — D649 Anemia, unspecified: Secondary | ICD-10-CM | POA: Insufficient documentation

## 2015-02-07 LAB — COMPREHENSIVE METABOLIC PANEL
ALBUMIN: 4.5 g/dL (ref 3.5–5.0)
ALT: 26 U/L (ref 14–54)
ANION GAP: 10 (ref 5–15)
AST: 17 U/L (ref 15–41)
Alkaline Phosphatase: 147 U/L — ABNORMAL HIGH (ref 38–126)
BUN: 12 mg/dL (ref 6–20)
CHLORIDE: 101 mmol/L (ref 101–111)
CO2: 28 mmol/L (ref 22–32)
Calcium: 9.8 mg/dL (ref 8.9–10.3)
Creatinine, Ser: 0.64 mg/dL (ref 0.44–1.00)
GFR calc Af Amer: >60 mL/min (ref >60)
GLUCOSE: 135 mg/dL — AB (ref 65–99)
POTASSIUM: 3.4 mmol/L — AB (ref 3.5–5.1)
Sodium: 139 mmol/L (ref 135–145)
Total Bilirubin: 0.6 mg/dL (ref 0.3–1.2)
Total Protein: 7.9 g/dL (ref 6.5–8.1)

## 2015-02-07 LAB — POC OCCULT BLOOD, ED: Fecal Occult Bld: NEGATIVE

## 2015-02-07 LAB — URINALYSIS, ROUTINE W REFLEX MICROSCOPIC
BILIRUBIN URINE: NEGATIVE
Glucose, UA: NEGATIVE mg/dL
Ketones, ur: NEGATIVE mg/dL
NITRITE: NEGATIVE
PH: 5.5 (ref 5.0–8.0)
Protein, ur: NEGATIVE mg/dL

## 2015-02-07 LAB — URINE MICROSCOPIC-ADD ON

## 2015-02-07 LAB — LIPASE, BLOOD: LIPASE: 32 U/L (ref 11–51)

## 2015-02-07 LAB — CBC
HEMATOCRIT: 34.8 % — AB (ref 36.0–46.0)
HEMOGLOBIN: 10.8 g/dL — AB (ref 12.0–15.0)
MCH: 26 pg (ref 26.0–34.0)
MCHC: 31 g/dL (ref 30.0–36.0)
MCV: 83.9 fL (ref 78.0–100.0)
Platelets: 468 10*3/uL — ABNORMAL HIGH (ref 150–400)
RBC: 4.15 MIL/uL (ref 3.87–5.11)
RDW: 18.4 % — AB (ref 11.5–15.5)
WBC: 15.3 10*3/uL — AB (ref 4.0–10.5)

## 2015-02-07 MED ORDER — HYDROMORPHONE HCL 1 MG/ML IJ SOLN
1.0000 mg | Freq: Once | INTRAMUSCULAR | Status: AC
  Administered 2015-02-07: 1 mg via INTRAVENOUS
  Filled 2015-02-07: qty 1

## 2015-02-07 MED ORDER — HYDROCODONE-ACETAMINOPHEN 5-325 MG PO TABS: ORAL_TABLET | ORAL | Status: DC

## 2015-02-07 MED ORDER — IOHEXOL 300 MG/ML  SOLN
100.0000 mL | Freq: Once | INTRAMUSCULAR | Status: AC | PRN
  Administered 2015-02-07: 100 mL via INTRAVENOUS

## 2015-02-07 MED ORDER — DIATRIZOATE MEGLUMINE & SODIUM 66-10 % PO SOLN
ORAL | Status: AC
  Filled 2015-02-07: qty 30

## 2015-02-07 MED ORDER — ONDANSETRON HCL 4 MG/2ML IJ SOLN
4.0000 mg | Freq: Once | INTRAMUSCULAR | Status: AC
  Administered 2015-02-07: 4 mg via INTRAVENOUS
  Filled 2015-02-07: qty 2

## 2015-02-07 NOTE — ED Provider Notes (Signed)
CSN: [id number]     Arrival date & time 02/07/15  1227 History   First MD Initiated Contact with Patient 02/07/15 1308     Chief Complaint  Patient presents with  . Abdominal Pain     (Consider location/radiation/quality/duration/timing/severity/associated sxs/prior Treatment) Patient is a 50 y.o. female presenting with abdominal pain.  Abdominal Pain Pain location:  Generalized Pain quality: aching and bloating   Pain radiates to:  Does not radiate Pain severity:  Mild Onset quality:  Gradual Duration:  5 hours Timing:  Constant Chronicity:  New Context: not alcohol use and not awakening from sleep   Relieved by:  None tried Associated symptoms: diarrhea and nausea   Associated symptoms: no chest pain, no constipation, no cough, no fatigue, no fever, no shortness of breath and no vomiting     Past Medical History  Diagnosis Date  . Rheumatoid arthritis(714.0)   . Chronic anemia   . Hypertension   . Arthritis, rheumatoid (HCC)   . Incidental lung nodule   . Depression   . Chronic pain   . COPD (chronic obstructive pulmonary disease) [facility])    Past Surgical History  Procedure Laterality Date  . Tubal ligation    . Endometrial ablation    . Breast cyst excision  12/31/2010    Procedure: CYST EXCISION BREAST;  Surgeon: Fabio Bering, MD;  Location: [facility];  Service: General;  Laterality: Right;  Excision Sebaceous Cyst Right Breast   Family History  Problem Relation Age of Onset  . Anesthesia problems Neg Hx   . Cancer Father    Social History  Substance Use Topics  . Smoking status: Current Every Day Smoker -- 1.00 packs/day for 30 years    Types: Cigarettes  . Smokeless tobacco: Never Used     Comment: has cut back  . Alcohol Use: No   OB History    Gravida Para Term Preterm AB TAB SAB Ectopic Multiple Living   6 5 5  1  1   5      Review of Systems  Constitutional: Negative for fever, fatigue and unexpected weight change.  HENT: Negative for congestion  and dental problem.   Eyes: Negative for pain.  Respiratory: Negative for cough and shortness of breath.   Cardiovascular: Negative for chest pain and palpitations.  Gastrointestinal: Positive for nausea, abdominal pain, diarrhea and abdominal distention. Negative for vomiting and constipation.  Endocrine: Negative for polydipsia and polyuria.  Musculoskeletal: Negative for neck pain and neck stiffness.  All other systems reviewed and are negative.     Allergies  Voltaren and Codeine  Home Medications   Prior to Admission medications   Medication Sig Start Date End Date Taking? Authorizing Provider  albuterol (PROVENTIL HFA;VENTOLIN HFA) 108 (90 BASE) MCG/ACT inhaler Inhale 2 puffs into the lungs every 6 (six) hours as needed for wheezing or shortness of breath.   Yes Historical Provider, MD  alendronate (FOSAMAX) 70 MG tablet Take 70 mg by mouth once a week. Take with a full glass of water on an empty stomach.(Monday)   Yes Historical Provider, MD  folic acid (FOLVITE) 1 MG tablet Take 1 mg by mouth daily.   Yes Historical Provider, MD  methotrexate (RHEUMATREX) 2.5 MG tablet Take 15 mg by mouth once a week. Caution:Chemotherapy. Protect from light.(Thursday)   Yes Historical Provider, MD  Multiple Vitamin (MULTIVITAMIN WITH MINERALS) TABS Take 1 tablet by mouth daily.   Yes Historical Provider, MD  Omega-3 Fatty Acids (FISH OIL) 1000 MG  CAPS Take 1,000 mg by mouth daily.   Yes Historical Provider, MD  omeprazole (PRILOSEC) 40 MG capsule Take 1 capsule (40 mg total) by mouth daily. 12/27/14  Yes Jacquelin Hawking, PA-C  predniSONE (DELTASONE) 10 MG tablet Take 20-25 mg by mouth See admin instructions. Taper of taking 25mg  daily for 2 weeks, then to take 20mg  daily thereafter   Yes Historical Provider, MD  HYDROcodone-acetaminophen (NORCO/VICODIN) 5-325 MG tablet Take one tab po q 4-6 hrs prn pain 02/07/15   , MD   BP 167/105 mmHg  Pulse 81  Temp(Src) 98 F (36.7 C) (Oral)   Resp 16  Ht 5\' 6"  (1.676 m)  Wt 170 lb (77.111 kg)  BMI 27.45 kg/m2  SpO2 100% Physical Exam  Constitutional: She appears well-developed and well-nourished.  HENT:  Head: Normocephalic and atraumatic.  Neck: Normal range of motion.  Cardiovascular: Normal rate and regular rhythm.   Pulmonary/Chest: No stridor. No respiratory distress. She has no wheezes. She has no rales.  Abdominal: Soft. Bowel sounds are normal. She exhibits distension. There is no rebound and no guarding.  Genitourinary: Rectal exam shows no external hemorrhoid, no internal hemorrhoid, no fissure, no mass, no tenderness and anal tone normal.  Neurological: She is alert.  Nursing note and vitals reviewed.   ED Course  Procedures (including critical care time) Labs Review Labs Reviewed  COMPREHENSIVE METABOLIC PANEL - Abnormal; Notable for the following:    Potassium 3.4 (*)    Glucose, Bld 135 (*)    Alkaline Phosphatase 147 (*)    All other components within normal limits  CBC - Abnormal; Notable for the following:    WBC 15.3 (*)    Hemoglobin 10.8 (*)    HCT 34.8 (*)    RDW 18.4 (*)    Platelets 468 (*)    All other components within normal limits  URINALYSIS, ROUTINE W REFLEX MICROSCOPIC (NOT AT [facility]) - Abnormal; Notable for the following:    Specific Gravity, Urine <1.005 (*)    Hgb urine dipstick SMALL (*)    Leukocytes, UA SMALL (*)    All other components within normal limits  URINE MICROSCOPIC-ADD ON - Abnormal; Notable for the following:    Squamous Epithelial / LPF 0-5 (*)    Bacteria, UA RARE (*)    All other components within normal limits  LIPASE, BLOOD  POC OCCULT BLOOD, ED    Imaging Review Ct Abdomen Pelvis W Contrast  02/07/2015  CLINICAL DATA:  Acute left lower quadrant abdominal pain. EXAM: CT ABDOMEN AND PELVIS WITH CONTRAST TECHNIQUE: Multidetector CT imaging of the abdomen and pelvis was performed using the standard protocol following bolus administration of intravenous  contrast. CONTRAST:  OMNIPAQUE IOHEXOL 300 MG/ML  SOLN COMPARISON:  CT scan of August 10, 2014. FINDINGS: Multilevel degenerative disc disease is noted in the lower lumbar spine. Visualized lung bases are unremarkable. There is interval development of what appears to be acute to subacute right sacral fracture. This may represent insufficiency fracture. No gallstones are noted. No focal abnormality is noted in the liver, spleen or pancreas. Adrenal glands and kidneys appear normal. No hydronephrosis or renal obstruction is noted. No renal or ureteral calculi are noted. The appendix appears normal. There is no evidence of bowel obstruction. Atherosclerosis of abdominal aorta is noted without aneurysm formation. Urinary bladder appears normal. Uterus and ovaries are unremarkable. No abnormal fluid collection is noted. No significant adenopathy is noted. IMPRESSION: Atherosclerosis of abdominal aorta without aneurysm formation. Interval  development of acute to subacute nondisplaced right sacral fracture. This may represent insufficiency fracture. No other significant abnormality seen in the abdomen or pelvis. Electronically Signed   By: Lupita Raider, M.D.   On: 02/07/2015 15:36   I have personally reviewed and evaluated these images and lab results as part of my medical decision-making.   EKG Interpretation None      MDM   Final diagnoses:  Abdominal pain, unspecified abdominal location  Closed fracture of sacrum, unspecified fracture morphology, initial encounter (HCC)   Abdominal pain, distension, diarrhea. Some nausea. Abdomen distended not e/o peritonitis. Will eval for possible obstruction, tx symptoms in mean time.  No obvious etiology for her symptoms. However found to have nondisplaced sacral fracture, will refer to orthopedics for same. Repeat abdominal exam still relatively benign with mild distension, history/physical and imaging inconsistent with obstructive or other pathology at this  time. Stable for dc home and ortho/PCP follow up.    Marily Memos, MD 02/07/15 [phone]

## 2015-02-07 NOTE — ED Notes (Signed)
Pt made aware to return if symptoms worsen or if any life threatening symptoms occur.  Dr. Clayborne Dana okay with bp.

## 2015-02-07 NOTE — ED Notes (Signed)
Pt returned from CT °

## 2015-02-07 NOTE — ED Notes (Signed)
Pt ambulated to bathroom 

## 2015-02-07 NOTE — ED Notes (Signed)
Pt comes in with left lower abdominal pain starting in the middle of the night last night. Pt states she is having dark stools today as well. Pt has hx of sciatica and states that is flaring up as well.

## 2015-02-07 NOTE — ED Notes (Signed)
MD at bedside. 

## 2015-02-16 ENCOUNTER — Emergency Department (HOSPITAL_COMMUNITY)
Admission: EM | Admit: 2015-02-16 | Discharge: 2015-02-16 | Disposition: A | Discharge status: Home / Self Care | Payer: Self-pay | Attending: Emergency Medicine | Admitting: Emergency Medicine

## 2015-02-16 ENCOUNTER — Encounter (HOSPITAL_COMMUNITY): Payer: Self-pay | Admitting: Emergency Medicine

## 2015-02-16 ENCOUNTER — Emergency Department (HOSPITAL_COMMUNITY): Payer: Self-pay

## 2015-02-16 DIAGNOSIS — Y998 Other external cause status: Secondary | ICD-10-CM | POA: Insufficient documentation

## 2015-02-16 DIAGNOSIS — F17210 Nicotine dependence, cigarettes, uncomplicated: Secondary | ICD-10-CM | POA: Insufficient documentation

## 2015-02-16 DIAGNOSIS — S3219XD Other fracture of sacrum, subsequent encounter for fracture with routine healing: Secondary | ICD-10-CM | POA: Insufficient documentation

## 2015-02-16 DIAGNOSIS — D649 Anemia, unspecified: Secondary | ICD-10-CM | POA: Insufficient documentation

## 2015-02-16 DIAGNOSIS — M8448XD Pathological fracture, other site, subsequent encounter for fracture with routine healing: Secondary | ICD-10-CM

## 2015-02-16 DIAGNOSIS — M069 Rheumatoid arthritis, unspecified: Secondary | ICD-10-CM | POA: Insufficient documentation

## 2015-02-16 DIAGNOSIS — F329 Major depressive disorder, single episode, unspecified: Secondary | ICD-10-CM | POA: Insufficient documentation

## 2015-02-16 DIAGNOSIS — J449 Chronic obstructive pulmonary disease, unspecified: Secondary | ICD-10-CM | POA: Insufficient documentation

## 2015-02-16 DIAGNOSIS — Y9301 Activity, walking, marching and hiking: Secondary | ICD-10-CM | POA: Insufficient documentation

## 2015-02-16 DIAGNOSIS — I10 Essential (primary) hypertension: Secondary | ICD-10-CM | POA: Insufficient documentation

## 2015-02-16 DIAGNOSIS — Y9289 Other specified places as the place of occurrence of the external cause: Secondary | ICD-10-CM | POA: Insufficient documentation

## 2015-02-16 DIAGNOSIS — Z79899 Other long term (current) drug therapy: Secondary | ICD-10-CM | POA: Insufficient documentation

## 2015-02-16 DIAGNOSIS — G8929 Other chronic pain: Secondary | ICD-10-CM | POA: Insufficient documentation

## 2015-02-16 DIAGNOSIS — W01198A Fall on same level from slipping, tripping and stumbling with subsequent striking against other object, initial encounter: Secondary | ICD-10-CM | POA: Insufficient documentation

## 2015-02-16 MED ORDER — ACETAMINOPHEN 325 MG PO TABS
650.0000 mg | ORAL_TABLET | Freq: Once | ORAL | Status: AC
  Administered 2015-02-16: 650 mg via ORAL
  Filled 2015-02-16: qty 2

## 2015-02-16 MED ORDER — OXYCODONE-ACETAMINOPHEN 5-325 MG PO TABS: 1.0000 | ORAL_TABLET | ORAL | Status: DC | PRN

## 2015-02-16 NOTE — ED Provider Notes (Signed)
CSN: [id number]     Arrival date & time 02/16/15  1045 History  By signing my name below, I, Ronney Lion, attest that this documentation has been prepared under the direction and in the presence of Mancel Bale, MD. Electronically Signed: Ronney Lion, ED Scribe. 02/16/2015. 8:27 PM.   Chief Complaint  Patient presents with  . Fall    last night  . Pelvic Pain   The history is provided by the patient. No language interpreter was used.   HPI Comments: [name] is a 50 y.o. female with a recent diagnosis of nondisplaced sacral fracture, as well as a history of RA, HTN, depression, chronic pain, and COPD, who presents to the Emergency Department complaining of constant, severe sacral pain S/P a fall that occurred this morning about 7 hours ago. Patient states she was walking in the dark to go to the toilet and fell. She notes she was seen here in the ED 8 days ago, on 02/07/15 for abdominal pain and at that time was found to have a nondisplaced sacral fracture. Sitting down exacerbates her pain. Lying down does not affect her pain. She denies fever, vomiting.   Past Medical History  Diagnosis Date  . Rheumatoid arthritis(714.0)   . Chronic anemia   . Hypertension   . Arthritis, rheumatoid (HCC)   . Incidental lung nodule   . Depression   . Chronic pain   . COPD (chronic obstructive pulmonary disease) [facility])    Past Surgical History  Procedure Laterality Date  . Tubal ligation    . Endometrial ablation    . Breast cyst excision  12/31/2010    Procedure: CYST EXCISION BREAST;  Surgeon: Fabio Bering, MD;  Location: [facility];  Service: General;  Laterality: Right;  Excision Sebaceous Cyst Right Breast   Family History  Problem Relation Age of Onset  . Anesthesia problems Neg Hx   . Cancer Father    Social History  Substance Use Topics  . Smoking status: Current Every Day Smoker -- 1.00 packs/day for 30 years    Types: Cigarettes  . Smokeless tobacco: Never Used     Comment: has  cut back  . Alcohol Use: No   OB History    Gravida Para Term Preterm AB TAB SAB Ectopic Multiple Living   6 5 5  1  1   5      Review of Systems A complete 10 system review of systems was obtained and all systems are negative except as noted in the HPI and PMH.    Allergies  Voltaren and Codeine  Home Medications   Prior to Admission medications   Medication Sig Start Date End Date Taking? Authorizing Provider  albuterol (PROVENTIL HFA;VENTOLIN HFA) 108 (90 BASE) MCG/ACT inhaler Inhale 2 puffs into the lungs every 6 (six) hours as needed for wheezing or shortness of breath.   Yes Historical Provider, MD  alendronate (FOSAMAX) 70 MG tablet Take 70 mg by mouth once a week. Take with a full glass of water on an empty stomach.(Monday)   Yes Historical Provider, MD  folic acid (FOLVITE) 1 MG tablet Take 1 mg by mouth daily.   Yes Historical Provider, MD  HYDROcodone-acetaminophen (NORCO/VICODIN) 5-325 MG tablet Take one tab po q 4-6 hrs prn pain 02/07/15  Yes 02/09/15, MD  methotrexate (RHEUMATREX) 2.5 MG tablet Take 15 mg by mouth once a week. Caution:Chemotherapy. Protect from light.(Thursday)   Yes Historical Provider, MD  Multiple Vitamin (MULTIVITAMIN WITH MINERALS) TABS  Take 1 tablet by mouth daily.   Yes Historical Provider, MD  Omega-3 Fatty Acids (FISH OIL) 1000 MG CAPS Take 1,000 mg by mouth daily.   Yes Historical Provider, MD  omeprazole (PRILOSEC) 40 MG capsule Take 1 capsule (40 mg total) by mouth daily. 12/27/14  Yes Jacquelin Hawking, PA-C  predniSONE (DELTASONE) 10 MG tablet Take 20-25 mg by mouth See admin instructions. Taper of taking 25mg  daily for 2 weeks, then to take 20mg  daily thereafter   Yes Historical Provider, MD  oxyCODONE-acetaminophen (PERCOCET) 5-325 MG tablet Take 1 tablet by mouth every 4 (four) hours as needed for severe pain. 02/16/15   , MD   BP 165/97 mmHg  Pulse 81  Temp(Src) 98.1 F (36.7 C) (Oral)  Resp 16  Ht 5\' 5"  (1.651 m)  Wt 171  lb (77.565 kg)  BMI 28.46 kg/m2  SpO2 100% Physical Exam  Constitutional: She is oriented to person, place, and time. She appears well-developed and well-nourished.  HENT:  Head: Normocephalic and atraumatic.  Eyes: Conjunctivae and EOM are normal. Pupils are equal, round, and reactive to light.  Neck: Normal range of motion and phonation normal. Neck supple.  Cardiovascular: Normal rate and regular rhythm.   Pulmonary/Chest: Effort normal and breath sounds normal. She exhibits no tenderness.  Abdominal: Soft. She exhibits no distension. There is no tenderness. There is no guarding.  Musculoskeletal: Normal range of motion. She exhibits tenderness.  Tender sacrum and right ischium.  Neurological: She is alert and oriented to person, place, and time. She exhibits normal muscle tone.  Skin: Skin is warm and dry.  Psychiatric: She has a normal mood and affect. Her behavior is normal. Judgment and thought content normal.  Nursing note and vitals reviewed.   ED Course  Procedures (including critical care time)  DIAGNOSTIC STUDIES: Oxygen Saturation is 98% on RA, normal by my interpretation.    COORDINATION OF CARE: 8:27 PM - Discussed treatment plan with pt at bedside which includes XR. Pt verbalized understanding and agreed to plan.   Labs Review Labs Reviewed - No data to display  Imaging Review Dg Pelvis 1-2 Views  02/16/2015  CLINICAL DATA:  PAIN IN SACRUM, PATIENT STATES " SHE FELL OFF THE TOILET IS MORNING" HISTORY OF HTN, RHEUMATOID ARTHRITIS, COPD EXAM: PELVIS - 1-2 VIEW COMPARISON:  CT 02/07/2015 FINDINGS: There is no evidence of pelvic fracture or diastasis. No pelvic bone lesions are seen. Bilateral pelvic phleboliths. IMPRESSION: Negative. Electronically Signed   By: M.D.   On: 02/16/2015 13:03   Dg Sacrum/coccyx  02/16/2015  CLINICAL DATA:  PAIN IN SACRUM, PATIENT STATES " SHE FELL OFF THE TOILET IS MORNING" HISTORY OF HTN, RHEUMATOID ARTHRITIS, COPD EXAM: SACRUM  AND COCCYX - 2+ VIEW COMPARISON:  CT 02/07/2015 FINDINGS: There is no evidence of fracture or other focal bone lesions. Bilateral pelvic phleboliths. Degenerative disc disease L4- S1, chronic. IMPRESSION: Negative sacrum/coccyx Electronically Signed   By: 04/16/2015 M.D.   On: 02/16/2015 13:04   I have personally reviewed and evaluated these images and lab results as part of my medical decision-making.   EKG Interpretation None      MDM   Final diagnoses:  Sacral insufficiency fracture, with routine healing, subsequent encounter    Ongoing right lower back pain, radiating to right leg, is likely related to a sacral insufficiency fracture. There is clinical evidence for this being present for at least 6 weeks. He may be related to her use of prednisone  chronically for rheumatoid arthritis. Doubt lumbar myelopathy, or unstable fracture.  Nursing Notes Reviewed/ Care Coordinated Applicable Imaging Reviewed Interpretation of Laboratory Data incorporated into ED treatment  The patient appears reasonably screened and/or stabilized for discharge and I doubt any other medical condition or other [facility] requiring further screening, evaluation, or treatment in the ED at this time prior to discharge.  Plan: Home Medications- Percocet; Home Treatments- rest; return here if the recommended treatment, does not improve the symptoms; Recommended follow up- PCP prn. Ortho and Rheum asap   I personally performed the services described in this documentation, which was scribed in my presence. The recorded information has been reviewed and is accurate.      Mancel Bale, MD 02/16/15 2030

## 2015-02-16 NOTE — ED Notes (Signed)
Larey Seat this am at 0500 going to toilet.  Hit pelvic pain, rates pain 8/10.  Denies any dizziness.  Pt says she was walking in dark, did not trip on anything.

## 2015-02-16 NOTE — ED Notes (Signed)
Pt called back to go to room.  Pt not in waiting room.  Says pt is outside on standing curb.

## 2015-02-16 NOTE — Discharge Instructions (Signed)
It is very important to follow-up with the orthopedic doctor as planned.  You should also see your rheumatologist for a checkup to see if there is an association with either your rheumatoid arthritis, or the use of prednisone, as a source for your fracture.  It is important to get some exercise and walk, so use your walker if you need that to help you walk.

## 2015-02-18 ENCOUNTER — Encounter: Payer: Self-pay | Admitting: "Endocrinology

## 2015-02-18 ENCOUNTER — Ambulatory Visit (INDEPENDENT_AMBULATORY_CARE_PROVIDER_SITE_OTHER): Payer: Self-pay | Admitting: "Endocrinology

## 2015-02-18 VITALS — BP 163/103 | HR 105 | Ht 65.0 in | Wt 167.0 lb

## 2015-02-18 DIAGNOSIS — E079 Disorder of thyroid, unspecified: Secondary | ICD-10-CM

## 2015-02-18 NOTE — Progress Notes (Signed)
Subjective:    Patient ID: [name], female    DOB: [birth year], PCP Jacquelin Hawking, PA-C   Past Medical History  Diagnosis Date  . Rheumatoid arthritis(714.0)   . Chronic anemia   . Hypertension   . Arthritis, rheumatoid (HCC)   . Incidental lung nodule   . Depression   . Chronic pain   . COPD (chronic obstructive pulmonary disease) [facility])    Past Surgical History  Procedure Laterality Date  . Tubal ligation    . Endometrial ablation    . Breast cyst excision  12/31/2010    Procedure: CYST EXCISION BREAST;  Surgeon: Fabio Bering, MD;  Location: [facility];  Service: General;  Laterality: Right;  Excision Sebaceous Cyst Right Breast   Social History   Social History  . Marital Status: Single    Spouse Name: N/A  . Number of Children: N/A  . Years of Education: N/A   Social History Main Topics  . Smoking status: Current Every Day Smoker -- 1.00 packs/day for 30 years    Types: Cigarettes  . Smokeless tobacco: Never Used     Comment: has cut back  . Alcohol Use: No  . Drug Use: No  . Sexual Activity: No   Other Topics Concern  . None   Social History Narrative   Outpatient Encounter Prescriptions as of 02/18/2015  Medication Sig  . alendronate (FOSAMAX) 70 MG tablet Take 70 mg by mouth once a week. Take with a full glass of water on an empty stomach.(Monday)  . methotrexate (RHEUMATREX) 2.5 MG tablet Take 15 mg by mouth once a week. Caution:Chemotherapy. Protect from light.(Thursday)  . omeprazole (PRILOSEC) 40 MG capsule Take 1 capsule (40 mg total) by mouth daily.  . predniSONE (DELTASONE) 10 MG tablet Take 20-25 mg by mouth See admin instructions. Taper of taking 25mg  daily for 2 weeks, then to take 20mg  daily thereafter  . albuterol (PROVENTIL HFA;VENTOLIN HFA) 108 (90 BASE) MCG/ACT inhaler Inhale 2 puffs into the lungs every 6 (six) hours as needed for wheezing or shortness of breath.  . folic acid (FOLVITE) 1 MG tablet Take 1 mg by mouth daily.   HYDROcodone-acetaminophen (NORCO/VICODIN) 5-325 MG tablet Take one tab po q 4-6 hrs prn pain  . Multiple Vitamin (MULTIVITAMIN WITH MINERALS) TABS Take 1 tablet by mouth daily.  . Omega-3 Fatty Acids (FISH OIL) 1000 MG CAPS Take 1,000 mg by mouth daily.  oxyCODONE-acetaminophen (PERCOCET) 5-325 MG tablet Take 1 tablet by mouth every 4 (four) hours as needed for severe pain.   No facility-administered encounter medications on file as of 02/18/2015.   ALLERGIES: Allergies  Allergen Reactions  . Voltaren [Diclofenac Sodium] Nausea And Vomiting  . Codeine Itching and Rash    Rash occurred when taking tylenol #3.   VACCINATION STATUS: Immunization History  Administered Date(s) Administered  . Tdap 07/02/2012    HPI  [name] is a patient with above medical history. She is being seen in consultation for thyroid disorder requested by 07/04/2012, PA-C. She states that she was recently diagnosed with hyperthyroidism. She is not on antithyroid therapy here and she denies any family history of thyroid dysfunction. She denies any personal history of goiter nor family history of thyroid cancer. She has gained approximately 50 pounds over the last year mainly due to steroid therapy for rheumatoid arthritis. She is a chronic smoker.  She reports tremors and anxiety. She denies heat intolerance.  Review of Systems Constitutional: +  weight gain, +fatigue, no subjective hyperthermia/hypothermia Eyes: no blurry vision, no xerophthalmia ENT: no sore throat, no nodules palpated in throat, no dysphagia/odynophagia, no hoarseness Cardiovascular: no CP/SOB/palpitations/leg swelling Respiratory: no cough/SOB Gastrointestinal: no N/V/D/C Musculoskeletal: Diffuse muscle and joint pains on opioids and methotrexate. Skin: no rashes Neurological: + tremors Psychiatric: +anxiety  Objective:    BP 163/103 mmHg  Pulse 105  Ht 5\' 5"  (1.651 m)  Wt 167 lb (75.751 kg)  BMI 27.79 kg/m2  SpO2 96%  Wt  Readings from Last 3 Encounters:  02/18/15 167 lb (75.751 kg)  02/16/15 171 lb (77.565 kg)  02/07/15 170 lb (77.111 kg)    Physical Exam Constitutional: overweight, in NAD, anxious affect. Eyes: PERRLA, EOMI, no exophthalmos ENT: moist mucous membranes, no thyromegaly, no cervical lymphadenopathy Cardiovascular:  She has tachycardia Respiratory: Diffuse wheezes and rales. Gastrointestinal: abdomen soft, NT, ND, BS+ Musculoskeletal: no deformities, strength intact in all 4 Skin: moist, warm, no rashes Neurological: +tremor with outstretched hands, DTR normal in all 4  CMP ( most recent) CMP     Component Value Date/Time   NA 139 02/07/2015 1248   K 3.4* 02/07/2015 1248   CL 101 02/07/2015 1248   CO2 28 02/07/2015 1248   GLUCOSE 135* 02/07/2015 1248   BUN 12 02/07/2015 1248   CREATININE 0.64 02/07/2015 1248   CALCIUM 9.8 02/07/2015 1248   PROT 7.9 02/07/2015 1248   ALBUMIN 4.5 02/07/2015 1248   AST 17 02/07/2015 1248   ALT 26 02/07/2015 1248   ALKPHOS 147* 02/07/2015 1248   BILITOT 0.6 02/07/2015 1248   GFRNONAA >60 02/07/2015 1248   GFRAA >60 02/07/2015 1248      Assessment & Plan:   1. Disease of thyroid gland -Patient is sent for consult for hyperthyroidism does not have recent thyroid function tests. I will proceed to obtain a new set of thyroid function test today and patient will return in one week for reevaluation. Antithyroid intervention will depend on the findings of her labs. His labs are suggestive of hyperthyroidism she will need confirmatory radioactive iodine uptake and scan of the thyroid. -No gross goiter on physical exam hence no need for thyroid ultrasound for now. Of note patient has taken prednisone 10-20 mg daily for the last more than a year currently tapering to 5 mg. She has cushingoid appearance. The risk of adrenal insufficiency from chronic steroid therapy is briefly discussed with her.  -History of chronic heavy smoking with risk of COPD. I  have counseled her against smoking.  - I advised patient to maintain close follow up with 02/09/2015, PA-C for primary care needs. Follow up plan: Return in about 1 week (around 02/25/2015) for overactive thyroid, labs today.  02/27/2015, MD Phone: [phone]  Fax: [phone]   02/18/2015, 2:12 PM

## 2015-02-20 ENCOUNTER — Encounter: Payer: Self-pay | Admitting: Orthopaedic Surgery

## 2015-02-20 ENCOUNTER — Ambulatory Visit (INDEPENDENT_AMBULATORY_CARE_PROVIDER_SITE_OTHER): Payer: Self-pay | Admitting: Orthopaedic Surgery

## 2015-02-20 VITALS — BP 168/103 | HR 106 | Temp 97.3°F | Ht 66.0 in | Wt 164.2 lb

## 2015-02-20 DIAGNOSIS — M800B9A Age-related osteoporosis with current pathological fracture, unspecified pelvis, initial encounter for fracture: Secondary | ICD-10-CM | POA: Insufficient documentation

## 2015-02-20 DIAGNOSIS — M80059A Age-related osteoporosis with current pathological fracture, unspecified femur, initial encounter for fracture: Secondary | ICD-10-CM | POA: Insufficient documentation

## 2015-02-20 DIAGNOSIS — S3210XA Unspecified fracture of sacrum, initial encounter for closed fracture: Secondary | ICD-10-CM

## 2015-02-20 DIAGNOSIS — M810 Age-related osteoporosis without current pathological fracture: Secondary | ICD-10-CM

## 2015-02-20 DIAGNOSIS — M80859A Other osteoporosis with current pathological fracture, unspecified femur, initial encounter for fracture: Secondary | ICD-10-CM

## 2015-02-20 MED ORDER — HYDROCODONE-ACETAMINOPHEN 7.5-325 MG PO TABS: 1.0000 | ORAL_TABLET | ORAL | Status: DC | PRN

## 2015-02-20 NOTE — Patient Instructions (Addendum)
You have been ordered a CT scan and bone density    You Can Quit Smoking If you are ready to quit smoking or are thinking about it, congratulations! You have chosen to help yourself be healthier and live longer! There are lots of different ways to quit smoking. Nicotine gum, nicotine patches, a nicotine inhaler, or nicotine nasal spray can help with physical craving. Hypnosis, support groups, and medicines help break the habit of smoking. TIPS TO GET OFF AND STAY OFF CIGARETTES  Learn to predict your moods. Do not let a bad situation be your excuse to have a cigarette. Some situations in your life might tempt you to have a cigarette.  Ask friends and co-workers not to smoke around you.  Make your home smoke-free.  Never have "just one" cigarette. It leads to wanting another and another. Remind yourself of your decision to quit.  On a card, make a list of your reasons for not smoking. Read it at least the same number of times a day as you have a cigarette. Tell yourself everyday, "I do not want to smoke. I choose not to smoke."  Ask someone at home or work to help you with your plan to quit smoking.  Have something planned after you eat or have a cup of coffee. Take a walk or get other exercise to perk you up. This will help to keep you from overeating.  Try a relaxation exercise to calm you down and decrease your stress. Remember, you may be tense and nervous the first two weeks after you quit. This will pass.  Find new activities to keep your hands busy. Play with a pen, coin, or rubber band. Doodle or draw things on paper.  Brush your teeth right after eating. This will help cut down the craving for the taste of tobacco after meals. You can try mouthwash too.  Try gum, breath mints, or diet candy to keep something in your mouth. IF YOU SMOKE AND WANT TO QUIT:  Do not stock up on cigarettes. Never buy a carton. Wait until one pack is finished before you buy another.  Never carry  cigarettes with you at work or at home.  Keep cigarettes as far away from you as possible. Leave them with someone else.  Never carry matches or a lighter with you.  Ask yourself, "Do I need this cigarette or is this just a reflex?"  Bet with someone that you can quit. Put cigarette money in a piggy bank every morning. If you smoke, you give up the money. If you do not smoke, by the end of the week, you keep the money.  Keep trying. It takes 21 days to change a habit!  Talk to your doctor about using medicines to help you quit. These include nicotine replacement gum, lozenges, or skin patches.   This information is not intended to replace advice given to you by your health care provider. Make sure you discuss any questions you have with your health care provider.   Document Released: 10/25/2008 Document Revised: 03/23/2011 Document Reviewed: 10/25/2008 Elsevier Interactive Patient Education [employer]

## 2015-02-21 ENCOUNTER — Other Ambulatory Visit: Payer: Self-pay | Admitting: *Deleted

## 2015-02-21 ENCOUNTER — Other Ambulatory Visit: Payer: Self-pay | Admitting: Orthopaedic Surgery

## 2015-02-21 ENCOUNTER — Telehealth: Payer: Self-pay | Admitting: *Deleted

## 2015-02-21 DIAGNOSIS — S3210XA Unspecified fracture of sacrum, initial encounter for closed fracture: Secondary | ICD-10-CM

## 2015-02-21 NOTE — Progress Notes (Signed)
Patient [name], female DOB:March 14, 1965, 50 y.o. PYK:[id number]  Chief Complaint  Patient presents with  . ER Follow Up    sacral fracture 02/16/15 DOI    HPI  [name] is a 50 y.o. female who presents with a complicated medical history.  She has rheumatoid arthritis and has been on prednisone for a long period of time.  She also has multiple other medical problems including chronic pain syndrome, marked osteoporosis, GERD and gastritis chronically, a chronic smoker of cigarettes, and just feeling bad all the time.  She has no insurance and has had problems in getting medical care.  She has been declined for Medicaid.  She has been to the [facility] where she says she got little help.  She is currently being seen at the [facility].  She does not have a family doctor and does not see the need for one.  Her arthritis is worse.  She goes to the ER for medical care also.  She was seen in the ER on 02/07/15 and had evaluation for acute lower quadrant abdominal pain.  She had a CT examination of the pelvis during this examination.  It was compared to an examination of similar nature of 08/10/14.  The examination showed interval development of an acute to subacute nondisplaced right sacral fracture which may represent an insufficiency fracture of the sacrum secondary to osteoporosis and chronic prednisone use, in other words, a pathological fracture secondary to the prednisone use.  She had another ER visit done on 02/16/15 and x-rays of the pelvis did not show the pelvis fracture.  She had been complaining more of pain in that area.  She is here today because of the x-ray findings.  She has chronic pain syndrome.  She has no new falls or trauma.    I spent a considerable amount of time, 45 minutes, talking to her and eventually getting my point across of her needing a primary care doctor to coordinate all of her care for her multiple medical problems.  I told her about going to [facility]  and discussing her needs and arranging proper treatment through their programs for patients in her financial situation.  I told her about going to the Health Department and having them be the primary care provider for a period of time.  They could assist with the Social Services department about trying to see if there are any Medicaid or other funds available for her.  She has significant medical problems that are chronic and have no cure.  She needs follow up on a regular basis than going to the ER on a regular basis and then being fortunate enough to be seen at the [facility].  She says she is willing to do this.   HPI per above  Body mass index is 26.52 kg/(m^2).  Review of Systems  Patient does not have Diabetes Mellitus. Patient has hypertension. Patient does not have COPD or shortness of breath. Patient does not have BMI > 35. Patient has current smoking history.  Review of Systems  Past Medical History  Diagnosis Date  . Rheumatoid arthritis(714.0)   . Chronic anemia   . Hypertension   . Arthritis, rheumatoid (HCC)   . Incidental lung nodule   . Depression   . Chronic pain   . COPD (chronic obstructive pulmonary disease) [facility])     Past Surgical History  Procedure Laterality Date  . Tubal ligation    . Endometrial ablation    .  Breast cyst excision  12/31/2010    Procedure: CYST EXCISION BREAST;  Surgeon: Fabio Bering, MD;  Location: [facility];  Service: General;  Laterality: Right;  Excision Sebaceous Cyst Right Breast    Family History  Problem Relation Age of Onset  . Anesthesia problems Neg Hx   . Cancer Father     Social History Social History  Substance Use Topics  . Smoking status: Current Every Day Smoker -- 1.00 packs/day for 30 years    Types: Cigarettes  . Smokeless tobacco: Never Used     Comment: has cut back  . Alcohol Use: No    Allergies  Allergen Reactions  . Voltaren [Diclofenac Sodium] Nausea And Vomiting  . Codeine Itching and Rash     Rash occurred when taking tylenol #3.    Current Outpatient Prescriptions  Medication Sig Dispense Refill  . albuterol (PROVENTIL HFA;VENTOLIN HFA) 108 (90 BASE) MCG/ACT inhaler Inhale 2 puffs into the lungs every 6 (six) hours as needed for wheezing or shortness of breath.    Marland Kitchen alendronate (FOSAMAX) 70 MG tablet Take 70 mg by mouth once a week. Take with a full glass of water on an empty stomach.(Monday)    . folic acid (FOLVITE) 1 MG tablet Take 1 mg by mouth daily.    . methotrexate (RHEUMATREX) 2.5 MG tablet Take 15 mg by mouth once a week. Caution:Chemotherapy. Protect from light.(Thursday)    . Multiple Vitamin (MULTIVITAMIN WITH MINERALS) TABS Take 1 tablet by mouth daily.    . Omega-3 Fatty Acids (FISH OIL) 1000 MG CAPS Take 1,000 mg by mouth daily.    Marland Kitchen omeprazole (PRILOSEC) 40 MG capsule Take 1 capsule (40 mg total) by mouth daily. 90 capsule 1  . predniSONE (DELTASONE) 10 MG tablet Take 20-25 mg by mouth See admin instructions. Taper of taking 25mg  daily for 2 weeks, then to take 20mg  daily thereafter    . HYDROcodone-acetaminophen (NORCO) 7.5-325 MG tablet Take 1 tablet by mouth every 4 (four) hours as needed for moderate pain (Must last 30 days.  Do not drive or operate machinery while taking this medicine.). 120 tablet 0   No current facility-administered medications for this visit.     Physical Exam  Blood pressure 168/103, pulse 106, temperature 97.3 F (36.3 C), height 5\' 6"  (1.676 m), weight 164 lb 3.2 oz (74.481 kg).  Constitutional: overall normal hygiene, normal nutrition, well developed, normal grooming, normal body habitus. Assistive device:walker  Musculoskeletal: gait and station Limp more to the right, muscle tone and strength are normal, no tremors or atrophy is present.  .  Neurological: coordination overall normal.  Deep tendon reflex/nerve stretch intact.  Sensation normal.  Cranial nerves II-XII intact.   Skin:normal overall no scars, lesions, ulcers or  rash es. No psoriasis.  Psychiatric: Alert and oriented x 3.  Recent memory intact, remote memory unclear.  Normal mood and affect. Well groomed.  Good eye contact.  Cardiovascular: overall no swelling, no varicosities, no edema bilaterally, normal temperatures of the legs and arms, no clubbing, cyanosis and good capillary refill.  Lymphatic: palpation is normal.   Extremities:She has a walker.  She complains of right sacral and posterior pelvis pain with no paresthesias.  She has no redness. Inspection normal Strength and tone normal Range of motion normal of both hips.  She has no significant deformities of the hands from her rheumatoid arthritis  Additional services performed: counseled patient about treatments and need for primary care doctor to be the captain  of her medical problems, need to talk to the [facility] system about financial issues, etc.  PLAN Call if any problems.  Precautions discussed.  I have given pain medicine.  I have scheduled her for a new CT scan of the pelvis to see status of sacral fracture and also scheduled her for a bone density study.   Return to clinic after the CT scan and bone density study.

## 2015-02-21 NOTE — Telephone Encounter (Signed)
Patient made aware of appointment for CT and Bone density appointment 02/27/15 CT at 8:45 am and bone density 9:30 am. No pre-cert required due to patient has [place] 100% discount

## 2015-02-21 NOTE — Telephone Encounter (Signed)
Patient called and stated that she came in yesterday and when she left she moved fast trying to catch the [employer] bus, and today she noticed a lot of swelling on the inside of her right thigh. I advised patient to elevate the leg, put cold compact on it, and take ibuprofen. Please advise if anything further.

## 2015-02-24 ENCOUNTER — Encounter (HOSPITAL_COMMUNITY): Payer: Self-pay

## 2015-02-24 ENCOUNTER — Emergency Department (HOSPITAL_COMMUNITY)
Admission: EM | Admit: 2015-02-24 | Discharge: 2015-02-24 | Disposition: A | Discharge status: Home / Self Care | Payer: Self-pay | Attending: Emergency Medicine | Admitting: Emergency Medicine

## 2015-02-24 DIAGNOSIS — M069 Rheumatoid arthritis, unspecified: Secondary | ICD-10-CM | POA: Insufficient documentation

## 2015-02-24 DIAGNOSIS — J449 Chronic obstructive pulmonary disease, unspecified: Secondary | ICD-10-CM | POA: Insufficient documentation

## 2015-02-24 DIAGNOSIS — G8929 Other chronic pain: Secondary | ICD-10-CM | POA: Insufficient documentation

## 2015-02-24 DIAGNOSIS — M79651 Pain in right thigh: Secondary | ICD-10-CM | POA: Insufficient documentation

## 2015-02-24 DIAGNOSIS — Z862 Personal history of diseases of the blood and blood-forming organs and certain disorders involving the immune mechanism: Secondary | ICD-10-CM | POA: Insufficient documentation

## 2015-02-24 DIAGNOSIS — M25571 Pain in right ankle and joints of right foot: Secondary | ICD-10-CM | POA: Insufficient documentation

## 2015-02-24 DIAGNOSIS — M25572 Pain in left ankle and joints of left foot: Secondary | ICD-10-CM | POA: Insufficient documentation

## 2015-02-24 DIAGNOSIS — I10 Essential (primary) hypertension: Secondary | ICD-10-CM | POA: Insufficient documentation

## 2015-02-24 DIAGNOSIS — R102 Pelvic and perineal pain: Secondary | ICD-10-CM | POA: Insufficient documentation

## 2015-02-24 DIAGNOSIS — F17210 Nicotine dependence, cigarettes, uncomplicated: Secondary | ICD-10-CM | POA: Insufficient documentation

## 2015-02-24 DIAGNOSIS — Z8659 Personal history of other mental and behavioral disorders: Secondary | ICD-10-CM | POA: Insufficient documentation

## 2015-02-24 DIAGNOSIS — Z79899 Other long term (current) drug therapy: Secondary | ICD-10-CM | POA: Insufficient documentation

## 2015-02-24 MED ORDER — KETOROLAC TROMETHAMINE 60 MG/2ML IM SOLN
60.0000 mg | Freq: Once | INTRAMUSCULAR | Status: AC
  Administered 2015-02-24: 60 mg via INTRAMUSCULAR
  Filled 2015-02-24: qty 2

## 2015-02-24 MED ORDER — OXYCODONE-ACETAMINOPHEN 5-325 MG PO TABS
1.0000 | ORAL_TABLET | Freq: Once | ORAL | Status: AC
  Administered 2015-02-24: 1 via ORAL
  Filled 2015-02-24: qty 1

## 2015-02-24 NOTE — ED Notes (Signed)
Pt complaining of bilateral leg swelling. States she is tapering down her prednisone and she thinks the swelling is related to that

## 2015-02-24 NOTE — Discharge Instructions (Signed)

## 2015-02-24 NOTE — ED Notes (Signed)
PT states BLE edema began last night. States she began tapering prednisone (for RA) recently and usually has swelling when doing so. Pt states hx of recent pelvic fx which was an incidental finding during a previous ED visit. States she felt a pop to the pelvic area and would like that checked as well. Pitting edema noted. NAD.

## 2015-02-25 ENCOUNTER — Other Ambulatory Visit: Payer: Self-pay | Admitting: Physician Assistant

## 2015-02-25 DIAGNOSIS — E0590 Thyrotoxicosis, unspecified without thyrotoxic crisis or storm: Secondary | ICD-10-CM

## 2015-02-25 DIAGNOSIS — M810 Age-related osteoporosis without current pathological fracture: Secondary | ICD-10-CM

## 2015-02-26 ENCOUNTER — Other Ambulatory Visit: Payer: Self-pay | Admitting: *Deleted

## 2015-02-26 DIAGNOSIS — S3210XA Unspecified fracture of sacrum, initial encounter for closed fracture: Secondary | ICD-10-CM

## 2015-02-26 NOTE — ED Provider Notes (Signed)
CSN: [id number]     Arrival date & time 02/24/15  1002 History   First MD Initiated Contact with Patient 02/24/15 1008     Chief Complaint  Patient presents with  . Leg Swelling     (Consider location/radiation/quality/duration/timing/severity/associated sxs/prior Treatment) HPI  [name] is a 50 y.o. female with hx of RA, HTN, chronic pain and COPD who presents to the Emergency Department complaining of bilateral ankle pain and swelling.  She states this is a recurrent problem and usually becomes exacerbated with changes in her steroid dose.  Symptoms began on the evening prior to ED arrival. She describes the pain as aching. She denies recent injury, redness, open wounds, calf pain or swelling.  She also reports a recent diagnosis of a sacral fracture and felt a "pulling" sensation to her right thigh that occurred one day ago while walking, pain improving.  She states she was recently seen here for a fall and given prescription for percocet, and states she has ran out.  She denies urine or bowel changes, abd pain, fever, chills, numbness or weakness of the lower extremities.     Past Medical History  Diagnosis Date  . Rheumatoid arthritis(714.0)   . Chronic anemia   . Hypertension   . Arthritis, rheumatoid (HCC)   . Incidental lung nodule   . Depression   . Chronic pain   . COPD (chronic obstructive pulmonary disease) [facility])    Past Surgical History  Procedure Laterality Date  . Tubal ligation    . Endometrial ablation    . Breast cyst excision  12/31/2010    Procedure: CYST EXCISION BREAST;  Surgeon: Fabio Bering, MD;  Location: [facility];  Service: General;  Laterality: Right;  Excision Sebaceous Cyst Right Breast   Family History  Problem Relation Age of Onset  . Anesthesia problems Neg Hx   . Cancer Father    Social History  Substance Use Topics  . Smoking status: Current Every Day Smoker -- 1.00 packs/day for 30 years    Types: Cigarettes  . Smokeless tobacco:  Never Used     Comment: has cut back  . Alcohol Use: No   OB History    Gravida Para Term Preterm AB TAB SAB Ectopic Multiple Living   6 5 5  1  1   5      Review of Systems  Constitutional: Negative for fever and chills.  Respiratory: Negative for shortness of breath.   Cardiovascular: Negative for chest pain.  Gastrointestinal: Negative for nausea, vomiting and abdominal pain.  Genitourinary: Positive for pelvic pain. Negative for dysuria, frequency, flank pain, decreased urine volume, vaginal bleeding, difficulty urinating and vaginal pain.  Musculoskeletal: Positive for arthralgias (bilateral ankle pain and swelling). Negative for joint swelling.  Skin: Negative for color change and wound.  Neurological: Negative for dizziness, weakness and numbness.  All other systems reviewed and are negative.     Allergies  Voltaren and Codeine  Home Medications   Prior to Admission medications   Medication Sig Start Date End Date Taking? Authorizing Provider  albuterol (PROVENTIL HFA;VENTOLIN HFA) 108 (90 BASE) MCG/ACT inhaler Inhale 2 puffs into the lungs every 6 (six) hours as needed for wheezing or shortness of breath.   Yes Historical Provider, MD  alendronate (FOSAMAX) 70 MG tablet Take 70 mg by mouth once a week. Take with a full glass of water on an empty stomach.(Monday)   Yes Historical Provider, MD  folic acid (FOLVITE) 1 MG tablet Take  1 mg by mouth daily.   Yes Historical Provider, MD  HYDROcodone-acetaminophen (NORCO) 7.5-325 MG tablet Take 1 tablet by mouth every 4 (four) hours as needed for moderate pain (Must last 30 days.  Do not drive or operate machinery while taking this medicine.). 02/20/15  Yes Darreld Mclean, MD  methotrexate (RHEUMATREX) 2.5 MG tablet Take 15 mg by mouth once a week. Caution:Chemotherapy. Protect from light.(Thursday)   Yes Historical Provider, MD  Multiple Vitamin (MULTIVITAMIN WITH MINERALS) TABS Take 1 tablet by mouth daily.   Yes Historical Provider,  MD  Omega-3 Fatty Acids (FISH OIL) 1000 MG CAPS Take 1,000 mg by mouth daily.   Yes Historical Provider, MD  omeprazole (PRILOSEC) 40 MG capsule Take 1 capsule (40 mg total) by mouth daily. 12/27/14  Yes Jacquelin Hawking, PA-C  predniSONE (DELTASONE) 10 MG tablet Take 20-25 mg by mouth See admin instructions. Taper of taking 25mg  daily for 2 weeks, then to take 20mg  daily thereafter   Yes Historical Provider, MD  Vitamin D, Ergocalciferol, (DRISDOL) 50000 units CAPS capsule Take 50,000 Units by mouth once a week. Takes on Mondays. 02/14/15  Yes Historical Provider, MD   BP 152/95 mmHg  Pulse 107  Temp(Src) 98.1 F (36.7 C) (Oral)  Resp 20  Ht 5\' 6"  (1.676 m)  Wt 77.565 kg  BMI 27.61 kg/m2  SpO2 98% Physical Exam  Constitutional: She is oriented to person, place, and time. She appears well-developed and well-nourished. No distress.  HENT:  Head: Normocephalic and atraumatic.  Mouth/Throat: Oropharynx is clear and moist.  Cardiovascular: Normal rate, regular rhythm and intact distal pulses.   No murmur heard. Pulmonary/Chest: Effort normal and breath sounds normal. No respiratory distress.  Abdominal: Soft. She exhibits no distension. There is no tenderness. There is no rebound and no guarding.  Musculoskeletal:  mild tenderness of the medial anterior right thigh.  No erythema or edema, no deformity.  Pt has full ROM of the hip.  Mild non-pitting edema of the bilateral ankles.  No excessive warmth, erythema or bony deformities.  Compartments soft.  No erythema or edema proximal to the ankles.  DP pulses brisk, sensation intact  Neurological: She is alert and oriented to person, place, and time.  Skin: Skin is warm and dry. No rash noted. No erythema.  Psychiatric: She has a normal mood and affect.  Nursing note and vitals reviewed.   ED Course  Procedures (including critical care time) Labs Review Labs Reviewed - No data to display  Imaging Review No results found. I have personally  reviewed and evaluated these images and lab results as part of my medical decision-making.   EKG Interpretation None      MDM   Final diagnoses:  Arthralgia of both ankles   Recent imaging and ED notes reviewed by me and considered in my MDM.  Pt is well appearing.  Non-toxic.  Currently on chronic prednisone therapy. mild edema of the left ankle w/o concerning sx's for cellulitis, gout or septic joint.  Pt given one time dose of Toradol and percocet here, pt had recent Rx for #30 percocet and I do not feel that additional narcotics are indicated at this time.  Pt has a PMD and agrees to arrange f/u.  Appears stable for d/c    04-10-1973, PA-C 02/26/15 1949  , MD 03/14/15 1538

## 2015-02-27 ENCOUNTER — Ambulatory Visit (HOSPITAL_COMMUNITY): Payer: Self-pay

## 2015-02-27 ENCOUNTER — Ambulatory Visit: Payer: Self-pay | Admitting: "Endocrinology

## 2015-02-27 ENCOUNTER — Ambulatory Visit (HOSPITAL_COMMUNITY)
Admission: RE | Admit: 2015-02-27 | Discharge: 2015-02-27 | Disposition: A | Discharge status: Home / Self Care | Payer: Self-pay | Source: Ambulatory Visit | Attending: Orthopaedic Surgery | Admitting: Orthopaedic Surgery

## 2015-02-27 ENCOUNTER — Emergency Department (HOSPITAL_COMMUNITY)
Admission: EM | Admit: 2015-02-27 | Discharge: 2015-02-27 | Disposition: A | Discharge status: Home / Self Care | Payer: Self-pay | Attending: Emergency Medicine | Admitting: Emergency Medicine

## 2015-02-27 ENCOUNTER — Other Ambulatory Visit: Payer: Self-pay | Admitting: Orthopaedic Surgery

## 2015-02-27 ENCOUNTER — Emergency Department (HOSPITAL_COMMUNITY): Payer: Self-pay

## 2015-02-27 ENCOUNTER — Encounter (HOSPITAL_COMMUNITY): Payer: Self-pay | Admitting: Emergency Medicine

## 2015-02-27 ENCOUNTER — Other Ambulatory Visit (HOSPITAL_COMMUNITY): Payer: Self-pay

## 2015-02-27 DIAGNOSIS — D649 Anemia, unspecified: Secondary | ICD-10-CM | POA: Insufficient documentation

## 2015-02-27 DIAGNOSIS — Z8659 Personal history of other mental and behavioral disorders: Secondary | ICD-10-CM | POA: Insufficient documentation

## 2015-02-27 DIAGNOSIS — S3210XA Unspecified fracture of sacrum, initial encounter for closed fracture: Secondary | ICD-10-CM

## 2015-02-27 DIAGNOSIS — S32591A Other specified fracture of right pubis, initial encounter for closed fracture: Secondary | ICD-10-CM | POA: Insufficient documentation

## 2015-02-27 DIAGNOSIS — S92902A Unspecified fracture of left foot, initial encounter for closed fracture: Secondary | ICD-10-CM | POA: Insufficient documentation

## 2015-02-27 DIAGNOSIS — X58XXXA Exposure to other specified factors, initial encounter: Secondary | ICD-10-CM | POA: Insufficient documentation

## 2015-02-27 DIAGNOSIS — M8448XA Pathological fracture, other site, initial encounter for fracture: Secondary | ICD-10-CM | POA: Insufficient documentation

## 2015-02-27 DIAGNOSIS — S92212A Displaced fracture of cuboid bone of left foot, initial encounter for closed fracture: Secondary | ICD-10-CM

## 2015-02-27 DIAGNOSIS — Z7952 Long term (current) use of systemic steroids: Secondary | ICD-10-CM | POA: Insufficient documentation

## 2015-02-27 DIAGNOSIS — M8088XA Other osteoporosis with current pathological fracture, vertebra(e), initial encounter for fracture: Secondary | ICD-10-CM | POA: Insufficient documentation

## 2015-02-27 DIAGNOSIS — J449 Chronic obstructive pulmonary disease, unspecified: Secondary | ICD-10-CM | POA: Insufficient documentation

## 2015-02-27 DIAGNOSIS — F17210 Nicotine dependence, cigarettes, uncomplicated: Secondary | ICD-10-CM | POA: Insufficient documentation

## 2015-02-27 DIAGNOSIS — Y9389 Activity, other specified: Secondary | ICD-10-CM | POA: Insufficient documentation

## 2015-02-27 DIAGNOSIS — M069 Rheumatoid arthritis, unspecified: Secondary | ICD-10-CM | POA: Insufficient documentation

## 2015-02-27 DIAGNOSIS — M8000XA Age-related osteoporosis with current pathological fracture, unspecified site, initial encounter for fracture: Secondary | ICD-10-CM | POA: Insufficient documentation

## 2015-02-27 DIAGNOSIS — Y998 Other external cause status: Secondary | ICD-10-CM | POA: Insufficient documentation

## 2015-02-27 DIAGNOSIS — Y9289 Other specified places as the place of occurrence of the external cause: Secondary | ICD-10-CM | POA: Insufficient documentation

## 2015-02-27 DIAGNOSIS — Z79899 Other long term (current) drug therapy: Secondary | ICD-10-CM | POA: Insufficient documentation

## 2015-02-27 DIAGNOSIS — M810 Age-related osteoporosis without current pathological fracture: Secondary | ICD-10-CM

## 2015-02-27 DIAGNOSIS — G8929 Other chronic pain: Secondary | ICD-10-CM | POA: Insufficient documentation

## 2015-02-27 LAB — T4, FREE: FREE T4: 1 ng/dL (ref 0.8–1.8)

## 2015-02-27 MED ORDER — KETOROLAC TROMETHAMINE 60 MG/2ML IM SOLN
60.0000 mg | Freq: Once | INTRAMUSCULAR | Status: AC
  Administered 2015-02-27: 60 mg via INTRAMUSCULAR
  Filled 2015-02-27: qty 2

## 2015-02-27 NOTE — ED Provider Notes (Addendum)
CSN: [id number]     Arrival date & time 02/27/15  1120 History   First MD Initiated Contact with Patient 02/27/15 1201     Chief Complaint  Patient presents with  . Leg Pain     (Consider location/radiation/quality/duration/timing/severity/associated sxs/prior Treatment) Patient is a 50 y.o. female presenting with leg pain. The history is provided by the patient. No language interpreter was used.  Leg Pain Location:  Ankle Injury: no   Ankle location:  L ankle Pain details:    Quality:  Aching   Radiates to:  Does not radiate   Severity:  Moderate   Onset quality:  Gradual   Timing:  Constant   Progression:  Worsening Chronicity:  New Prior injury to area:  No Worsened by:  Nothing tried Ineffective treatments:  None tried Pt has rheumatoid arthritis.  Pt reports left foot has swollen and is painful.  No known injury  Past Medical History  Diagnosis Date  . Rheumatoid arthritis(714.0)   . Chronic anemia   . Hypertension   . Arthritis, rheumatoid (HCC)   . Incidental lung nodule   . Depression   . Chronic pain   . COPD (chronic obstructive pulmonary disease) [facility])    Past Surgical History  Procedure Laterality Date  . Tubal ligation    . Endometrial ablation    . Breast cyst excision  12/31/2010    Procedure: CYST EXCISION BREAST;  Surgeon: Fabio Bering, MD;  Location: [facility];  Service: General;  Laterality: Right;  Excision Sebaceous Cyst Right Breast   Family History  Problem Relation Age of Onset  . Anesthesia problems Neg Hx   . Cancer Father    Social History  Substance Use Topics  . Smoking status: Current Every Day Smoker -- 1.00 packs/day for 30 years    Types: Cigarettes  . Smokeless tobacco: Never Used     Comment: has cut back  . Alcohol Use: No   OB History    Gravida Para Term Preterm AB TAB SAB Ectopic Multiple Living   6 5 5  1  1   5      Review of Systems  All other systems reviewed and are negative.     Allergies  Voltaren and  Codeine  Home Medications   Prior to Admission medications   Medication Sig Start Date End Date Taking? Authorizing Provider  albuterol (PROVENTIL HFA;VENTOLIN HFA) 108 (90 BASE) MCG/ACT inhaler Inhale 2 puffs into the lungs every 6 (six) hours as needed for wheezing or shortness of breath.   Yes Historical Provider, MD  alendronate (FOSAMAX) 70 MG tablet Take 70 mg by mouth once a week. Take with a full glass of water on an empty stomach.(Monday)   Yes Historical Provider, MD  folic acid (FOLVITE) 1 MG tablet Take 1 mg by mouth daily.   Yes Historical Provider, MD  HYDROcodone-acetaminophen (NORCO) 7.5-325 MG tablet Take 1 tablet by mouth every 4 (four) hours as needed for moderate pain (Must last 30 days.  Do not drive or operate machinery while taking this medicine.). 02/20/15  Yes 04/20/15, MD  methotrexate (RHEUMATREX) 2.5 MG tablet Take 15 mg by mouth once a week. Caution:Chemotherapy. Protect from light.(Thursday)   Yes Historical Provider, MD  Multiple Vitamin (MULTIVITAMIN WITH MINERALS) TABS Take 1 tablet by mouth daily.   Yes Historical Provider, MD  Omega-3 Fatty Acids (FISH OIL) 1000 MG CAPS Take 1,000 mg by mouth daily.   Yes Historical Provider, MD  omeprazole (PRILOSEC) 40  MG capsule Take 1 capsule (40 mg total) by mouth daily. 12/27/14  Yes Jacquelin Hawking, PA-C  predniSONE (DELTASONE) 10 MG tablet Take 15 mg by mouth See admin instructions. Taper of taking 25mg  daily for 2 weeks, then to take 20mg  daily thereafter   Yes Historical Provider, MD  Vitamin D, Ergocalciferol, (DRISDOL) 50000 units CAPS capsule Take 50,000 Units by mouth once a week. Takes on Mondays. 02/14/15  Yes Historical Provider, MD   BP 167/99 mmHg  Pulse 96  Temp(Src) 98.4 F (36.9 C) (Oral)  Resp 17  Ht 5\' 5"  (1.651 m)  Wt 75.751 kg  BMI 27.79 kg/m2  SpO2 99% Physical Exam  Constitutional: She appears well-developed and well-nourished.  HENT:  Head: Normocephalic.  Neck: Normal range of motion.   Cardiovascular: Normal rate.   Pulmonary/Chest: Effort normal.  Musculoskeletal: She exhibits tenderness.  Swollen left foot, tender to palpation nv and ns intact  Neurological: She is alert.  Skin: Skin is warm.  Psychiatric: She has a normal mood and affect.  Nursing note and vitals reviewed.   ED Course  Procedures (including critical care time) Labs Review Labs Reviewed - No data to display  Imaging Review Dg Ankle Complete Left  02/27/2015  CLINICAL DATA:  Left ankle and foot pain with swelling for 3 days. No known injury. EXAM: LEFT ANKLE COMPLETE - 3+ VIEW COMPARISON:  Radiographs 09/18/2014 and 06/19/2014 FINDINGS: The bones are demineralized. There is no evidence of acute fracture or dislocation. The ankle joint is maintained. The talar dome and tibial plafond appear normal. There are progressive arthropathic changes at the talonavicular joint, further described on the foot radiographs. There is a probable ankle joint effusion. The soft tissues surrounding the ankle are mildly prominent. IMPRESSION: No acute osseous findings at the ankle. Progressive talonavicular arthropathic changes, further described on separate report of the foot. Electronically Signed   By: 03/01/2015 M.D.   On: 02/27/2015 13:24   Ct Pelvis Wo Contrast  02/27/2015  CLINICAL DATA:  Fall off toilet 02/16/2015.  Continued sacral pain. EXAM: CT PELVIS WITHOUT CONTRAST TECHNIQUE: Multidetector CT imaging of the pelvis was performed following the standard protocol without intravenous contrast. COMPARISON:  Plain films 02/16/2015.  CT 02/07/2015 FINDINGS: There are bilateral sacral ala fractures. There is sclerosis around the fracture lines compatible with chronic fractures. These were present on prior CT pelvis and likely reflect insufficiency fractures. Nondisplaced fractures through the right superior and inferior pubic rami and are new since prior CT. Hip joints are maintained. No proximal femoral fracture.  Degenerative disc disease in the visualized lower lumbar spine. Visualized large and small bowel are unremarkable. Scattered calcifications in the distal aorta and iliac vessels without visible aneurysm. Uterus, adnexae and urinary bladder are unremarkable. IMPRESSION: Chronic appearing bilateral sacral ala fractures with sclerosis, likely insufficiency fractures. Nondisplaced right superior and inferior pubic rami fractures. Electronically Signed   By: 04/16/2015 M.D.   On: 02/27/2015 09:17   Dg Bone Density  02/27/2015  EXAM: DUAL X-RAY ABSORPTIOMETRY (DXA) FOR BONE MINERAL DENSITY IMPRESSION: Ordering Physician:  Dr. Charlett Nose, Your patient Prisca Gearing completed a BMD test on 02/27/2015 using the [employer] DXA System (software version: 14.10) manufactured by [employer]. The following summarizes the results of our evaluation. PATIENT BIOGRAPHICAL: Name: BRITTNIE, LEWEY Patient ID: [id number] Birth Date: 1965-05-01 Height: 65.5 in. Gender: Female Exam Date: 02/27/2015 Weight: 167.0 lbs. Indications: Caucasian, Chronic Steroid Use, Height Loss, Pre Menopausal, Recurrent Falls, Rheumatoid Arthritis, Tobacco  User, Vitamin D Deficiency Fractures: Treatments: Fosamax, Multivitamin, Vitamin D DENSITOMETRY RESULTS: Site      Region     Measured Date Measured Age Age Matched Z Score BMD         %Change vs. Previous Significant Change (*) [facility] Spine L1-L4 02/27/2015 47.8 -1.5 1.016 g/cm2 - - DualFemur Neck Right 02/27/2015 47.8 -1.9 0.704 g/cm2 - - ASSESSMENT: The Z-score is within the expected range for age. ISCD recommends using Z-scores for assessment of pre-menopausal women, men between 60 and 2 yoa, and children under 20 yoa. The diagnosis of osteoporosis in these patients should not be made on the basis of densitometric criteria alone. (Patient does not meet criteria for FRAX assessment due to being on Fosamax.) World Health Organization [facility]) criteria for post-menopausal, Caucasian Women:  Normal:       T-score at or above -1 SD Osteopenia:   T-score between -1 and -2.5 SD Osteoporosis: T-score at or below -2.5 SD RECOMMENDATIONS: All patients should ensure an adequate intake of dietary calcium (1200 mg/d) and vitamin D (800 IU daily) unless contraindicated. FOLLOW-UP: People with diagnosed cases of osteoporosis or osteopenia should be regularly tested for bone mineral density. For patients eligible for Medicare, routine testing is allowed once every 2 years. Testing frequency can be increased for patients who have rapidly progressing disease, or for those who are receiving medical therapy to restore bone mass. I have reviewed this report, and agreee with the above findings. [facility] Electronically Signed   By: Elberta Fortis M.D.   On: 02/27/2015 11:20   Dg Foot Complete Left  02/27/2015  CLINICAL DATA:  Left ankle and foot pain with swelling for 3 days. No known injury. History of rheumatoid arthritis. EXAM: LEFT FOOT - COMPLETE 3+ VIEW COMPARISON:  Radiographs 06/19/2014. FINDINGS: The bones are demineralized. There are stable erosive changes at the first MTP joint and within the fourth metatarsal head. The alignment is normal at the Lisfranc joint. There is markedly progressive talonavicular joint space loss with joint space irregularity and mild subchondral collapse. There is new sclerosis within the cuboid bone, suspicious for an insufficiency fracture. Mild soft tissue swelling is present within the dorsum of the forefoot and midfoot. IMPRESSION: 1. 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 This may be secondary to the patient's underlying rheumatoid arthritis. Septic joint cannot be excluded. 2. New linear sclerosis within the cuboid, suspicious for an insufficiency fracture. Electronically Signed   By: Carey Bullocks M.D.   On: 02/27/2015 13:28   I have personally reviewed and evaluated these images and lab results as part of my medical decision-making.   EKG  Interpretation None      MDM   Final diagnoses:  Fracture of left foot, closed, initial encounter    Pt placed in a cam walker Pt given torodol Im.  I advised follow up with Dr. Hilda Lias for recheck. Continue current pain medication An After Visit Summary was printed and given to the patient.    Lonia Skinner [place], PA-C 02/27/15 1550  Vanetta Mulders, MD 02/28/15 2042  Lonia Skinner [place], PA-C 03/27/15 1131  Vanetta Mulders, MD 03/28/15 2110

## 2015-02-27 NOTE — ED Notes (Signed)
Pt reports bilateral ankle swelling since Sunday.  Pt was here Sunday and seen for this.  Pt was diagnosed with arthralgia.  Pt had bone density scan done this morning and had CT scan of pelvis, pt has fracture of sacrum (diagnosed weeks ago).  Pt alert and oriented. Ambulatory with walker.

## 2015-02-27 NOTE — Discharge Instructions (Signed)
Cast or Splint Care °Casts and splints support injured limbs and keep bones from moving while they heal. It is important to care for your cast or splint at home.   °HOME CARE INSTRUCTIONS °· Keep the cast or splint uncovered during the drying period. It can take 24 to 48 hours to dry if it is made of plaster. A fiberglass cast will dry in less than 1 hour. °· Do not rest the cast on anything harder than a pillow for the first 24 hours. °· Do not put weight on your injured limb or apply pressure to the cast until your health care provider gives you permission. °· Keep the cast or splint dry. Wet casts or splints can lose their shape and may not support the limb as well. A wet cast that has lost its shape can also create harmful pressure on your skin when it dries. Also, wet skin can become infected. °· Cover the cast or splint with a plastic bag when bathing or when out in the rain or snow. If the cast is on the trunk of the body, take sponge baths until the cast is removed. °· If your cast does become wet, dry it with a towel or a blow dryer on the cool setting only. °· Keep your cast or splint clean. Soiled casts may be wiped with a moistened cloth. °· Do not place any hard or soft foreign objects under your cast or splint, such as cotton, toilet paper, lotion, or powder. °· Do not try to scratch the skin under the cast with any object. The object could get stuck inside the cast. Also, scratching could lead to an infection. If itching is a problem, use a blow dryer on a cool setting to relieve discomfort. °· Do not trim or cut your cast or remove padding from inside of it. °· Exercise all joints next to the injury that are not immobilized by the cast or splint. For example, if you have a long leg cast, exercise the hip joint and toes. If you have an arm cast or splint, exercise the shoulder, elbow, thumb, and fingers. °· Elevate your injured arm or leg on 1 or 2 pillows for the first 1 to 3 days to decrease  swelling and pain. It is best if you can comfortably elevate your cast so it is higher than your heart. °SEEK MEDICAL CARE IF:  °· Your cast or splint cracks. °· Your cast or splint is too tight or too loose. °· You have unbearable itching inside the cast. °· Your cast becomes wet or develops a soft spot or area. °· You have a bad smell coming from inside your cast. °· You get an object stuck under your cast. °· Your skin around the cast becomes red or raw. °· You have new pain or worsening pain after the cast has been applied. °SEEK IMMEDIATE MEDICAL CARE IF:  °· You have fluid leaking through the cast. °· You are unable to move your fingers or toes. °· You have discolored (blue or white), cool, painful, or very swollen fingers or toes beyond the cast. °· You have tingling or numbness around the injured area. °· You have severe pain or pressure under the cast. °· You have any difficulty with your breathing or have shortness of breath. °· You have chest pain. °  °This information is not intended to replace advice given to you by your health care provider. Make sure you discuss any questions you have with your health care   provider. °  °Document Released: 12/27/1999 Document Revised: 10/19/2012 Document Reviewed: 07/07/2012 °Elsevier Interactive Patient Education ©2016 Elsevier Inc[employer] ° °Tarsal Navicular Fracture °A tarsal navicular fracture is a break in the navicular bone in your foot. The navicular bone is at the top of the middle of your foot. It is one of the bones in a group of bones called the tarsal bones. The navicular bone is wedged between other bones. Running and jumping put a lot of pressure on your navicular bone. Tarsal navicular fractures occur most often in athletes.  °CAUSES  °A tarsal navicular fracture can be caused by: °· Severe twisting of your foot. °· Something heavy falling on your foot. °· Stress on the navicular bone from your foot striking the ground repeatedly (stress fracture). °RISK  FACTORS °You may be at risk for a navicular fracture if you participate in high-impact activities such as: °· Track and field. °· Football. °· Soccer. °· Basketball. °· Gymnastics. °· Ballet dancing. °Other risk factors include: °· Being a woman with an irregular menstrual cycle. °· Having a condition that causes your bones to become thin and brittle (osteoporosis). °· Being a smoker. °· Starting a new sport without being in good shape. °· Wearing athletic shoes that do not fit well. °SIGNS AND SYMPTOMS °An aching pain at the top of your foot is the most common symptom. The pain may move down into the arch of your foot. The pain will get worse with activity and better with rest. Other symptoms may include: °· Swelling on the top of your foot. °· Pain when pressing on the top of your foot. °· Pain when hopping on your foot. °DIAGNOSIS  °Your health care provider may suspect a tarsal navicular fracture if you recently injured your foot and have symptoms of a fracture. A physical exam will be done. During this exam, your health care provider may move your foot into different positions to check for pain. If you have pain when the health care provider presses on your navicular bone, then it is very likely that you have a navicular fracture. °An X-ray of your foot may be done to help confirm the diagnosis. Regular X-rays often do not show a stress fracture. You may need to have other imaging studies, such as: °· A bone scan. °· A CT scan. °· An MRI. °TREATMENT  °Your health care provider will determine the best treatment for you based on the severity of your fracture.  °· If the broken bone is in good alignment, a cast or splint may be applied. The cast or splint will likely need to be worn for several weeks. While the cast or splint is on, you cannot put weight on your foot. You will need close follow-up with your health care provider to make sure you are healing. °· Rarely, if the fracture is severe and the broken bone  is out of place, your health care provider will need to align the fracture using a surgical procedure called open reduction and internal fixation (ORIF). °¨ In the ORIF procedure, the fracture site is opened up, and the bone pieces are fixed into place with metal screws or pins. °¨ After surgery, you may need to wear a cast or splint. You will also need close follow-up with your health care provider to make sure you are healing. °HOME CARE INSTRUCTIONS  °· Use crutches as directed by your health care provider. Do not put weight on your injured foot until your health care provider approves. °·   If you have a plaster or fiberglass cast:   °¨ Do not try to scratch the skin under the cast with sharp or pointed objects. °¨ Check the skin around the cast every day. You may put lotion on any red or sore areas.   °¨ Keep your cast dry and clean. °· Use a plastic bag to protect your cast or splint from water while bathing. Do not lower your cast or splint into water. °· Take medicines only as directed by your health care provider. °· Keep all follow-up visits as directed by your health care provider. This is important. °SEEK MEDICAL CARE IF:  °· You have very bad pain, and medicine is not helping. °· You have more than a small spot of bleeding from under your cast or splint. °· You have drainage, redness, or swelling at the injury site. °· You have a fever. °· You notice a bad smell coming from your cast or splint.   °· Your cast or splint cracks, breaks, or gets wet. °SEEK IMMEDIATE MEDICAL CARE IF:  °· You begin to lose feeling in your foot or toes. °· You have swelling in your foot or toes that is increasing. °· Your foot or toes feel cold or turn blue. °· You develop a rash.   °MAKE SURE YOU: °· Understand these instructions. °· Will watch your condition. °· Will get help right away if you are not doing well or get worse. °  °This information is not intended to replace advice given to you by your health care provider.  Make sure you discuss any questions you have with your health care provider. °  °Document Released: 04/12/2000 Document Revised: 01/19/2014 Document Reviewed: 03/03/2013 °Elsevier Interactive Patient Education ©2016 Elsevier Inc. ° °

## 2015-02-28 LAB — TSH: TSH: 0.31 m[IU]/L — AB

## 2015-02-28 LAB — THYROGLOBULIN ANTIBODY: Thyroglobulin Ab: <1 IU/mL (ref <2)

## 2015-02-28 LAB — THYROID PEROXIDASE ANTIBODY: Thyroperoxidase Ab SerPl-aCnc: 2 IU/mL (ref <9)

## 2015-02-28 LAB — T3, FREE: T3, Free: 2.9 pg/mL (ref 2.3–4.2)

## 2015-03-01 ENCOUNTER — Emergency Department (HOSPITAL_COMMUNITY)
Admission: EM | Admit: 2015-03-01 | Discharge: 2015-03-01 | Disposition: A | Discharge status: Home / Self Care | Payer: Self-pay | Attending: Emergency Medicine | Admitting: Emergency Medicine

## 2015-03-01 ENCOUNTER — Emergency Department (HOSPITAL_COMMUNITY): Payer: Self-pay

## 2015-03-01 ENCOUNTER — Encounter (HOSPITAL_COMMUNITY): Payer: Self-pay | Admitting: Emergency Medicine

## 2015-03-01 DIAGNOSIS — M069 Rheumatoid arthritis, unspecified: Secondary | ICD-10-CM | POA: Insufficient documentation

## 2015-03-01 DIAGNOSIS — Z79899 Other long term (current) drug therapy: Secondary | ICD-10-CM | POA: Insufficient documentation

## 2015-03-01 DIAGNOSIS — S93401A Sprain of unspecified ligament of right ankle, initial encounter: Secondary | ICD-10-CM | POA: Insufficient documentation

## 2015-03-01 DIAGNOSIS — Z7952 Long term (current) use of systemic steroids: Secondary | ICD-10-CM | POA: Insufficient documentation

## 2015-03-01 DIAGNOSIS — Y9389 Activity, other specified: Secondary | ICD-10-CM | POA: Insufficient documentation

## 2015-03-01 DIAGNOSIS — Z8659 Personal history of other mental and behavioral disorders: Secondary | ICD-10-CM | POA: Insufficient documentation

## 2015-03-01 DIAGNOSIS — I10 Essential (primary) hypertension: Secondary | ICD-10-CM | POA: Insufficient documentation

## 2015-03-01 DIAGNOSIS — W1839XA Other fall on same level, initial encounter: Secondary | ICD-10-CM | POA: Insufficient documentation

## 2015-03-01 DIAGNOSIS — Y9289 Other specified places as the place of occurrence of the external cause: Secondary | ICD-10-CM | POA: Insufficient documentation

## 2015-03-01 DIAGNOSIS — Y998 Other external cause status: Secondary | ICD-10-CM | POA: Insufficient documentation

## 2015-03-01 DIAGNOSIS — D649 Anemia, unspecified: Secondary | ICD-10-CM | POA: Insufficient documentation

## 2015-03-01 DIAGNOSIS — G8929 Other chronic pain: Secondary | ICD-10-CM | POA: Insufficient documentation

## 2015-03-01 DIAGNOSIS — F17210 Nicotine dependence, cigarettes, uncomplicated: Secondary | ICD-10-CM | POA: Insufficient documentation

## 2015-03-01 DIAGNOSIS — J449 Chronic obstructive pulmonary disease, unspecified: Secondary | ICD-10-CM | POA: Insufficient documentation

## 2015-03-01 MED ORDER — KETOROLAC TROMETHAMINE 30 MG/ML IJ SOLN
60.0000 mg | Freq: Once | INTRAMUSCULAR | Status: AC
  Administered 2015-03-01: 60 mg via INTRAMUSCULAR
  Filled 2015-03-01: qty 2

## 2015-03-01 NOTE — Discharge Instructions (Signed)
Ankle Sprain  An ankle sprain is an injury to the strong, fibrous tissues (ligaments) that hold the bones of your ankle joint together.   CAUSES  An ankle sprain is usually caused by a fall or by twisting your ankle. Ankle sprains most commonly occur when you step on the outer edge of your foot, and your ankle turns inward. People who participate in sports are more prone to these types of injuries.   SYMPTOMS    Pain in your ankle. The pain may be present at rest or only when you are trying to stand or walk.   Swelling.   Bruising. Bruising may develop immediately or within 1 to 2 days after your injury.   Difficulty standing or walking, particularly when turning corners or changing directions.  DIAGNOSIS   Your caregiver will ask you details about your injury and perform a physical exam of your ankle to determine if you have an ankle sprain. During the physical exam, your caregiver will press on and apply pressure to specific areas of your foot and ankle. Your caregiver will try to move your ankle in certain ways. An X-ray exam may be done to be sure a bone was not broken or a ligament did not separate from one of the bones in your ankle (avulsion fracture).   TREATMENT   Certain types of braces can help stabilize your ankle. Your caregiver can make a recommendation for this. Your caregiver may recommend the use of medicine for pain. If your sprain is severe, your caregiver may refer you to a surgeon who helps to restore function to parts of your skeletal system (orthopedist) or a physical therapist.  HOME CARE INSTRUCTIONS    Apply ice to your injury for 1-2 days or as directed by your caregiver. Applying ice helps to reduce inflammation and pain.    Put ice in a plastic bag.    Place a towel between your skin and the bag.    Leave the ice on for 15-20 minutes at a time, every 2 hours while you are awake.   Only take over-the-counter or prescription medicines for pain, discomfort, or fever as directed by  your caregiver.   Elevate your injured ankle above the level of your heart as much as possible for 2-3 days.   If your caregiver recommends crutches, use them as instructed. Gradually put weight on the affected ankle. Continue to use crutches or a cane until you can walk without feeling pain in your ankle.   If you have a plaster splint, wear the splint as directed by your caregiver. Do not rest it on anything harder than a pillow for the first 24 hours. Do not put weight on it. Do not get it wet. You may take it off to take a shower or bath.   You may have been given an elastic bandage to wear around your ankle to provide support. If the elastic bandage is too tight (you have numbness or tingling in your foot or your foot becomes cold and blue), adjust the bandage to make it comfortable.   If you have an air splint, you may blow more air into it or let air out to make it more comfortable. You may take your splint off at night and before taking a shower or bath. Wiggle your toes in the splint several times per day to decrease swelling.  SEEK MEDICAL CARE IF:    You have rapidly increasing bruising or swelling.   Your toes feel   extremely cold or you lose feeling in your foot.   Your pain is not relieved with medicine.  SEEK IMMEDIATE MEDICAL CARE IF:   Your toes are numb or blue.   You have severe pain that is increasing.  MAKE SURE YOU:    Understand these instructions.   Will watch your condition.   Will get help right away if you are not doing well or get worse.     This information is not intended to replace advice given to you by your health care provider. Make sure you discuss any questions you have with your health care provider.     Document Released: 12/29/2004 Document Revised: 01/19/2014 Document Reviewed: 01/10/2011  Elsevier Interactive Patient Education 2016 [employer] Inc[employer]

## 2015-03-01 NOTE — ED Notes (Signed)
Pt states that she was here 2 days ago and was put in an ankle boot for her left ankle.  Now right one is swollen and painful.

## 2015-03-01 NOTE — ED Provider Notes (Signed)
CSN: [id number]     Arrival date & time 03/01/15  1226 History   First MD Initiated Contact with Patient 03/01/15 1249     Chief Complaint  Patient presents with  . Ankle Pain     (Consider location/radiation/quality/duration/timing/severity/associated sxs/prior Treatment) Patient is a 50 y.o. female presenting with ankle pain. The history is provided by the patient. No language interpreter was used.  Ankle Pain Location:  Ankle Injury: yes   Mechanism of injury: fall   Fall:    Fall occurred:  Standing Ankle location:  R ankle Pain details:    Quality:  Aching   Radiates to:  Does not radiate   Severity:  Moderate   Onset quality:  Gradual   Timing:  Constant   Progression:  Worsening Chronicity:  New Foreign body present:  No foreign bodies Prior injury to area:  No Worsened by:  Nothing tried Ineffective treatments:  None tried Associated symptoms: swelling   Risk factors: no concern for non-accidental trauma     Past Medical History  Diagnosis Date  . Rheumatoid arthritis(714.0)   . Chronic anemia   . Hypertension   . Arthritis, rheumatoid (HCC)   . Incidental lung nodule   . Depression   . Chronic pain   . COPD (chronic obstructive pulmonary disease) [facility])    Past Surgical History  Procedure Laterality Date  . Tubal ligation    . Endometrial ablation    . Breast cyst excision  12/31/2010    Procedure: CYST EXCISION BREAST;  Surgeon: Fabio Bering, MD;  Location: [facility];  Service: General;  Laterality: Right;  Excision Sebaceous Cyst Right Breast   Family History  Problem Relation Age of Onset  . Anesthesia problems Neg Hx   . Cancer Father    Social History  Substance Use Topics  . Smoking status: Current Every Day Smoker -- 1.00 packs/day for 30 years    Types: Cigarettes  . Smokeless tobacco: Never Used     Comment: has cut back  . Alcohol Use: No   OB History    Gravida Para Term Preterm AB TAB SAB Ectopic Multiple Living   6 5 5  1  1   5       Review of Systems  All other systems reviewed and are negative.     Allergies  Voltaren and Codeine  Home Medications   Prior to Admission medications   Medication Sig Start Date End Date Taking? Authorizing Provider  albuterol (PROVENTIL HFA;VENTOLIN HFA) 108 (90 BASE) MCG/ACT inhaler Inhale 2 puffs into the lungs every 6 (six) hours as needed for wheezing or shortness of breath.    Historical Provider, MD  alendronate (FOSAMAX) 70 MG tablet Take 70 mg by mouth once a week. Take with a full glass of water on an empty stomach.(Monday)    Historical Provider, MD  folic acid (FOLVITE) 1 MG tablet Take 1 mg by mouth daily.    Historical Provider, MD  HYDROcodone-acetaminophen (NORCO) 7.5-325 MG tablet Take 1 tablet by mouth every 4 (four) hours as needed for moderate pain (Must last 30 days.  Do not drive or operate machinery while taking this medicine.). 02/20/15   04/20/15, MD  methotrexate (RHEUMATREX) 2.5 MG tablet Take 15 mg by mouth once a week. Caution:Chemotherapy. Protect from light.(Thursday)    Historical Provider, MD  Multiple Vitamin (MULTIVITAMIN WITH MINERALS) TABS Take 1 tablet by mouth daily.    Historical Provider, MD  Omega-3 Fatty Acids (FISH OIL) 1000  MG CAPS Take 1,000 mg by mouth daily.    Historical Provider, MD  omeprazole (PRILOSEC) 40 MG capsule Take 1 capsule (40 mg total) by mouth daily. 12/27/14   Jacquelin Hawking, PA-C  predniSONE (DELTASONE) 10 MG tablet Take 15 mg by mouth See admin instructions. Taper of taking 25mg  daily for 2 weeks, then to take 20mg  daily thereafter    Historical Provider, MD  Vitamin D, Ergocalciferol, (DRISDOL) 50000 units CAPS capsule Take 50,000 Units by mouth once a week. Takes on Mondays. 02/14/15   Historical Provider, MD   BP 159/85 mmHg  Pulse 107  Temp(Src) 97.6 F (36.4 C) (Temporal)  Resp 18  Ht 5' 5.5" (1.664 m)  Wt 78.472 kg  BMI 28.34 kg/m2  SpO2 98% Physical Exam  Constitutional: She is oriented to person,  place, and time. She appears well-developed and well-nourished.  HENT:  Head: Normocephalic.  Eyes: EOM are normal.  Neck: Normal range of motion.  Pulmonary/Chest: Effort normal.  Abdominal: She exhibits no distension.  Musculoskeletal: She exhibits tenderness.  Swollen right ankle, from,  nv and ns intact  Neurological: She is alert and oriented to person, place, and time.  Psychiatric: She has a normal mood and affect.  Nursing note and vitals reviewed.   ED Course  Procedures (including critical care time) Labs Review Labs Reviewed - No data to display  Imaging Review Dg Ankle Complete Right  03/01/2015  CLINICAL DATA:  Right ankle pain and swelling after twisting injury today. Initial encounter. EXAM: RIGHT ANKLE - COMPLETE 3+ VIEW COMPARISON:  September 18, 2014. FINDINGS: There is no evidence of fracture, dislocation, or joint effusion. Degenerative changes are noted in the midfoot. Soft tissues are unremarkable. IMPRESSION: No acute abnormality seen in the right ankle. Electronically Signed   By: 03/03/2015, M.D.   On: 03/01/2015 13:30   Dg Foot Complete Right  03/01/2015  CLINICAL DATA:  Acute left foot pain and swelling after twisting injury today. Initial encounter. EXAM: RIGHT FOOT COMPLETE - 3+ VIEW COMPARISON:  None. FINDINGS: No fracture or dislocation is noted. Mild hallux valgus deformity of the first metatarsophalangeal joint is noted. Severe degenerative changes seen involving the talonavicular joint. Mild spurring of posterior calcaneus is noted. IMPRESSION: No acute abnormality seen in the left foot. Electronically Signed   By: 03/03/2015, M.D.   On: 03/01/2015 13:32   I have personally reviewed and evaluated these images and lab results as part of my medical decision-making.   EKG Interpretation None      MDM pt' xrayed due to severe osteo.  No fracture.  Pt counseled on sprains.  Pt is given torodol and reports relief from pain.   Pt has pain  medication at home.    Final diagnoses:  Sprain of right ankle, initial encounter    Pt placed in a cam walker.  Pt has one on other foot from visit 2 days ago.    Lupita Raider [place], PA-C 03/01/15 1421  [place], MD 03/01/15 1500

## 2015-03-04 ENCOUNTER — Other Ambulatory Visit (HOSPITAL_COMMUNITY): Payer: Self-pay

## 2015-03-05 ENCOUNTER — Encounter: Payer: Self-pay | Admitting: Orthopaedic Surgery

## 2015-03-05 ENCOUNTER — Ambulatory Visit (INDEPENDENT_AMBULATORY_CARE_PROVIDER_SITE_OTHER): Payer: Self-pay | Admitting: Orthopaedic Surgery

## 2015-03-05 ENCOUNTER — Ambulatory Visit: Payer: Self-pay | Admitting: Orthopaedic Surgery

## 2015-03-05 VITALS — BP 130/94 | HR 93 | Temp 98.1°F | Resp 16 | Ht 65.0 in | Wt 173.0 lb

## 2015-03-05 DIAGNOSIS — S92213A Displaced fracture of cuboid bone of unspecified foot, initial encounter for closed fracture: Secondary | ICD-10-CM | POA: Insufficient documentation

## 2015-03-05 DIAGNOSIS — S92212A Displaced fracture of cuboid bone of left foot, initial encounter for closed fracture: Secondary | ICD-10-CM

## 2015-03-05 DIAGNOSIS — M84454A Pathological fracture, pelvis, initial encounter for fracture: Secondary | ICD-10-CM | POA: Insufficient documentation

## 2015-03-05 NOTE — Patient Instructions (Signed)
Try to get appointment to rheumatologist very soon

## 2015-03-05 NOTE — Progress Notes (Signed)
Patient [name] Helaine Chess, female DOB:[birth year], 51 y.o. VHQ:[id number]  Chief Complaint  Patient presents with  . Follow-up    follow up Ct scan pelvis and bone density    HPI  [name] is a 50 y.o. female who is here to go over reports of CT scan of pelvis, bone density study.  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  She has less pain in the left foot and ankle since she has been using the CAM walker given from the ER.  Her pain is controlled on the pain medicine I gave her. She also got some more higher strength from the ER.  She should not mix them.    The report of the foot is as follows: 1. 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 This may be secondary to the patient's underlying rheumatoid arthritis. Septic joint cannot be excluded. 2. New linear sclerosis within the cuboid, suspicious for an insufficiency fracture.   The CT report of the pelvis is as follows: Chronic appearing bilateral sacral ala fractures with sclerosis, likely insufficiency fractures.  Nondisplaced right superior and inferior pubic rami fractures.  The bone density study reveals: ASSESSMENT: The Z-score is within the expected range for age. ISCD recommends using Z-scores for assessment of pre-menopausal women, men between 31 and 22 yoa, and children under 20 yoa. The diagnosis of osteoporosis in these patients should not be made on the basis of densitometric criteria alone. (Patient does not meet criteria for FRAX assessment due to being on Fosamax.) HPI  Body mass index is 28.79 kg/(m^2).   Review of Systems  Constitutional: Positive for fatigue.       Patient does not have Diabetes Mellitus. Patient has hypertension. Patient has COPD or shortness of breath. Patient does not have BMI > 35. Patient has current smoking history.  Musculoskeletal: Positive for myalgias, back pain, joint  swelling, arthralgias and gait problem.       She has rheumatoid arthritis.  Long term steroid use.    Past Medical History  Diagnosis Date  . Rheumatoid arthritis(714.0)   . Chronic anemia   . Hypertension   . Arthritis, rheumatoid (HCC)   . Incidental lung nodule   . Depression   . Chronic pain   . COPD (chronic obstructive pulmonary disease) [facility])     Past Surgical History  Procedure Laterality Date  . Tubal ligation    . Endometrial ablation    . Breast cyst excision  12/31/2010    Procedure: CYST EXCISION BREAST;  Surgeon: Fabio Bering, MD;  Location: [facility];  Service: General;  Laterality: Right;  Excision Sebaceous Cyst Right Breast    Family History  Problem Relation Age of Onset  . Anesthesia problems Neg Hx   . Cancer Father     Social History Social History  Substance Use Topics  . Smoking status: Current Every Day Smoker -- 1.00 packs/day for 30 years    Types: Cigarettes  . Smokeless tobacco: Never Used     Comment: has cut back  . Alcohol Use: No    Allergies  Allergen Reactions  . Voltaren [Diclofenac Sodium] Nausea And Vomiting  . Codeine Itching and Rash    Rash occurred when taking tylenol #3.    Current Outpatient Prescriptions  Medication Sig Dispense Refill  . albuterol (PROVENTIL HFA;VENTOLIN HFA) 108 (90 BASE) MCG/ACT inhaler Inhale 2 puffs into  the lungs every 6 (six) hours as needed for wheezing or shortness of breath.    Marland Kitchen alendronate (FOSAMAX) 70 MG tablet Take 70 mg by mouth once a week. Take with a full glass of water on an empty stomach.(Monday)    . folic acid (FOLVITE) 1 MG tablet Take 1 mg by mouth daily.    Marland Kitchen HYDROcodone-acetaminophen (NORCO) 7.5-325 MG tablet Take 1 tablet by mouth every 4 (four) hours as needed for moderate pain (Must last 30 days.  Do not drive or operate machinery while taking this medicine.). 120 tablet 0  . methotrexate (RHEUMATREX) 2.5 MG tablet Take 15 mg by mouth once a week. Caution:Chemotherapy.  Protect from light.(Thursday)    . Multiple Vitamin (MULTIVITAMIN WITH MINERALS) TABS Take 1 tablet by mouth daily.    . Omega-3 Fatty Acids (FISH OIL) 1000 MG CAPS Take 1,000 mg by mouth daily.    Marland Kitchen omeprazole (PRILOSEC) 40 MG capsule Take 1 capsule (40 mg total) by mouth daily. 90 capsule 1  . predniSONE (DELTASONE) 10 MG tablet Take 15 mg by mouth See admin instructions. Taper of taking 25mg  daily for 2 weeks, then to take 20mg  daily thereafter    . Vitamin D, Ergocalciferol, (DRISDOL) 50000 units CAPS capsule Take 50,000 Units by mouth once a week. Takes on Mondays.  2   No current facility-administered medications for this visit.     Physical Exam  Blood pressure 130/94, pulse 93, temperature 98.1 F (36.7 C), resp. rate 16, height 5\' 5"  (1.651 m), weight 173 lb (78.472 kg).  Constitutional: overall normal hygiene, normal nutrition, well developed, normal grooming, normal body habitus. Assistive device:walker  Musculoskeletal: gait and station Limp left with CAM walker also present, muscle tone and strength are normal, no tremors or atrophy is present.  .  Neurological: coordination overall normal.  Deep tendon reflex/nerve stretch intact.  Sensation normal.  Cranial nerves II-XII intact.   Skin:   normal overall no scars, lesions, ulcers or rashes. No psoriasis.  Psychiatric: Alert and oriented x 3.  Recent memory intact, remote memory unclear.  Normal mood and affect. Well groomed.  Good eye contact.  Cardiovascular: overall no swelling, no varicosities, no edema bilaterally, normal temperatures of the legs and arms, no clubbing, cyanosis and good capillary refill.  Lymphatic: palpation is normal.   Extremities:left foot with CAM Walker and tender. Pelvis tender over the right ischium area Inspection walker and CAM walker.  Left foot NV intact Strength and tone normal Range of motion decreased of the left ankle and toes, normal of the right hip but painful.  Additional  services performed: I reviewed the CT of the pelvis and the report.  I reviewed the x-rays of the left foot and the report as well as the ER visits.  I reviewed the bone density scan as well as the reports.  She has significant osteoporosis with insufficiency fractures despite what the bone density scan shows.  Her long term steroid use has made this possible I believe.  The patient has been educated about the nature of the problem(s) and counseled on treatment options.  The patient appeared to understand what I have discussed and is in agreement with it.  PLAN Call if any problems.  Precautions discussed.  Continue current medications.   Return to clinic one month with x-rays then of pelvis and left foot.

## 2015-03-06 ENCOUNTER — Ambulatory Visit: Payer: Self-pay | Admitting: Physician Assistant

## 2015-03-08 ENCOUNTER — Emergency Department (HOSPITAL_COMMUNITY): Payer: Self-pay

## 2015-03-08 ENCOUNTER — Encounter: Payer: Self-pay | Admitting: "Endocrinology

## 2015-03-08 ENCOUNTER — Ambulatory Visit (INDEPENDENT_AMBULATORY_CARE_PROVIDER_SITE_OTHER): Payer: Self-pay | Admitting: "Endocrinology

## 2015-03-08 ENCOUNTER — Emergency Department (HOSPITAL_COMMUNITY)
Admission: EM | Admit: 2015-03-08 | Discharge: 2015-03-08 | Disposition: A | Discharge status: Home / Self Care | Payer: Self-pay | Attending: Emergency Medicine | Admitting: Emergency Medicine

## 2015-03-08 ENCOUNTER — Encounter (HOSPITAL_COMMUNITY): Payer: Self-pay | Admitting: Emergency Medicine

## 2015-03-08 VITALS — BP 131/87 | HR 98 | Ht 65.0 in | Wt 158.0 lb

## 2015-03-08 DIAGNOSIS — J449 Chronic obstructive pulmonary disease, unspecified: Secondary | ICD-10-CM | POA: Insufficient documentation

## 2015-03-08 DIAGNOSIS — Z9851 Tubal ligation status: Secondary | ICD-10-CM | POA: Insufficient documentation

## 2015-03-08 DIAGNOSIS — M810 Age-related osteoporosis without current pathological fracture: Secondary | ICD-10-CM | POA: Insufficient documentation

## 2015-03-08 DIAGNOSIS — W1840XD Slipping, tripping and stumbling without falling, unspecified, subsequent encounter: Secondary | ICD-10-CM | POA: Insufficient documentation

## 2015-03-08 DIAGNOSIS — F329 Major depressive disorder, single episode, unspecified: Secondary | ICD-10-CM | POA: Insufficient documentation

## 2015-03-08 DIAGNOSIS — M069 Rheumatoid arthritis, unspecified: Secondary | ICD-10-CM | POA: Insufficient documentation

## 2015-03-08 DIAGNOSIS — G8929 Other chronic pain: Secondary | ICD-10-CM | POA: Insufficient documentation

## 2015-03-08 DIAGNOSIS — I10 Essential (primary) hypertension: Secondary | ICD-10-CM | POA: Insufficient documentation

## 2015-03-08 DIAGNOSIS — S329XXD Fracture of unspecified parts of lumbosacral spine and pelvis, subsequent encounter for fracture with routine healing: Secondary | ICD-10-CM | POA: Insufficient documentation

## 2015-03-08 DIAGNOSIS — E0590 Thyrotoxicosis, unspecified without thyrotoxic crisis or storm: Secondary | ICD-10-CM

## 2015-03-08 DIAGNOSIS — F17210 Nicotine dependence, cigarettes, uncomplicated: Secondary | ICD-10-CM | POA: Insufficient documentation

## 2015-03-08 MED ORDER — HYDROCODONE-ACETAMINOPHEN 5-325 MG PO TABS
2.0000 | ORAL_TABLET | Freq: Once | ORAL | Status: AC
  Administered 2015-03-08: 2 via ORAL
  Filled 2015-03-08: qty 2

## 2015-03-08 MED ORDER — KETOROLAC TROMETHAMINE 60 MG/2ML IM SOLN
60.0000 mg | Freq: Once | INTRAMUSCULAR | Status: AC
  Administered 2015-03-08: 60 mg via INTRAMUSCULAR
  Filled 2015-03-08: qty 2

## 2015-03-08 NOTE — Progress Notes (Signed)
Subjective:    Patient ID: [name], female    DOB: 24-Jan-1965, PCP Jacquelin Hawking, PA-C   Past Medical History  Diagnosis Date  . Rheumatoid arthritis(714.0)   . Chronic anemia   . Hypertension   . Arthritis, rheumatoid (HCC)   . Incidental lung nodule   . Depression   . Chronic pain   . COPD (chronic obstructive pulmonary disease) [facility])    Past Surgical History  Procedure Laterality Date  . Tubal ligation    . Endometrial ablation    . Breast cyst excision  12/31/2010    Procedure: CYST EXCISION BREAST;  Surgeon: Fabio Bering, MD;  Location: [facility];  Service: General;  Laterality: Right;  Excision Sebaceous Cyst Right Breast   Social History   Social History  . Marital Status: Single    Spouse Name: N/A  . Number of Children: N/A  . Years of Education: N/A   Social History Main Topics  . Smoking status: Current Every Day Smoker -- 1.00 packs/day for 30 years    Types: Cigarettes  . Smokeless tobacco: Never Used     Comment: has cut back  . Alcohol Use: No  . Drug Use: No  . Sexual Activity: No   Other Topics Concern  . None   Social History Narrative   Outpatient Encounter Prescriptions as of 03/08/2015  Medication Sig  . albuterol (PROVENTIL HFA;VENTOLIN HFA) 108 (90 BASE) MCG/ACT inhaler Inhale 2 puffs into the lungs every 6 (six) hours as needed for wheezing or shortness of breath.  Marland Kitchen alendronate (FOSAMAX) 70 MG tablet Take 70 mg by mouth once a week. Take with a full glass of water on an empty stomach.(Monday)  . folic acid (FOLVITE) 1 MG tablet Take 1 mg by mouth daily.  Marland Kitchen HYDROcodone-acetaminophen (NORCO) 7.5-325 MG tablet Take 1 tablet by mouth every 4 (four) hours as needed for moderate pain (Must last 30 days.  Do not drive or operate machinery while taking this medicine.).  Marland Kitchen methotrexate (RHEUMATREX) 2.5 MG tablet Take 15 mg by mouth once a week. Caution:Chemotherapy. Protect from light.(Thursday)  . Multiple Vitamin (MULTIVITAMIN WITH  MINERALS) TABS Take 1 tablet by mouth daily.  . Omega-3 Fatty Acids (FISH OIL) 1000 MG CAPS Take 1,000 mg by mouth daily.  Marland Kitchen omeprazole (PRILOSEC) 40 MG capsule Take 1 capsule (40 mg total) by mouth daily.  . predniSONE (DELTASONE) 10 MG tablet Take 15 mg by mouth See admin instructions. Taper of taking 25mg  daily for 2 weeks, then to take 20mg  daily thereafter  . Vitamin D, Ergocalciferol, (DRISDOL) 50000 units CAPS capsule Take 50,000 Units by mouth once a week. Takes on Mondays.   No facility-administered encounter medications on file as of 03/08/2015.   ALLERGIES: Allergies  Allergen Reactions  . Voltaren [Diclofenac Sodium] Nausea And Vomiting  . Codeine Itching and Rash    Rash occurred when taking tylenol #3.   VACCINATION STATUS: Immunization History  Administered Date(s) Administered  . Tdap 07/02/2012    HPI  Miss Stegner is a patient with above medical history. She is here to follow-up with repeat thyroid function test.  -Since her last visit she says she was diagnosed with osteoporosis and started on Fosamax 70 mg weekly followed by rheumatology.   She denies any personal history of goiter nor family history of thyroid cancer.  -  she is on methotrexate and being tapered off prednisone currently on 15 mg, lost 10 pounds after she  gained approximately  50 pounds over the last year mainly due to steroid therapy for rheumatoid arthritis. She is a chronic smoker.  She reports tremors and anxiety. She denies heat intolerance.  Review of Systems Constitutional:  +fatigue, no subjective hyperthermia/hypothermia Eyes: no blurry vision, no xerophthalmia ENT: no sore throat, no nodules palpated in throat, no dysphagia/odynophagia, no hoarseness Cardiovascular: no CP/SOB/palpitations/leg swelling Respiratory: no cough/SOB Gastrointestinal: no N/V/D/C Musculoskeletal: Diffuse muscle and joint pains on opioids and methotrexate. Skin: no rashes Neurological: - tremors Psychiatric:  +anxiety  Objective:    BP 131/87 mmHg  Pulse 98  Ht 5\' 5"  (1.651 m)  Wt 158 lb (71.668 kg)  BMI 26.29 kg/m2  SpO2 98%  Wt Readings from Last 3 Encounters:  03/08/15 158 lb (71.668 kg)  03/05/15 173 lb (78.472 kg)  03/01/15 173 lb (78.472 kg)    Physical Exam Constitutional: overweight, in NAD, anxious affect. Eyes: PERRLA, EOMI, no exophthalmos ENT: moist mucous membranes, no thyromegaly, no cervical lymphadenopathy Cardiovascular:   Tachycardia resolved . Respiratory: Diffuse wheezes and rales. Gastrointestinal: abdomen soft, NT, ND, BS+ Musculoskeletal: no deformities, strength intact in all 4 Skin: moist, warm, no rashes Neurological: Mild tremors of outstretched hands,  DTR normal in all 4  CMP ( most recent) CMP     Component Value Date/Time   NA 139 02/07/2015 1248   K 3.4* 02/07/2015 1248   CL 101 02/07/2015 1248   CO2 28 02/07/2015 1248   GLUCOSE 135* 02/07/2015 1248   BUN 12 02/07/2015 1248   CREATININE 0.64 02/07/2015 1248   CALCIUM 9.8 02/07/2015 1248   PROT 7.9 02/07/2015 1248   ALBUMIN 4.5 02/07/2015 1248   AST 17 02/07/2015 1248   ALT 26 02/07/2015 1248   ALKPHOS 147* 02/07/2015 1248   BILITOT 0.6 02/07/2015 1248   GFRNONAA >60 02/07/2015 1248   GFRAA >60 02/07/2015 1248    Results for BIONCA, MCKEY (MRN [name]) as of 03/08/2015 15:43  Ref. Range 02/25/2015 10:47 02/25/2015 14:49  TSH Latest Units: mIU/L 0.31 (L)   T4,Free(Direct) Latest Ref Range: 0.8-1.8 ng/dL  1.0  Triiodothyronine,Free,Serum Latest Ref Range: 2.3-4.2 pg/mL  2.9  Thyroglobulin Ab Latest Ref Range: <2 IU/mL  <1  Thyroperoxidase Ab SerPl-aCnc Latest Ref Range: <9 IU/mL  2    Assessment & Plan:   1.Subclinical hyperthyroidism: -Based on her complete repeat full profile thyroid function tests consistent with subclinical hyperthyroidism, she will not need antithyroid intervention at this point. She will have repeat thyroid function tests in 6 months. -No gross goiter on  physical exam hence no need for thyroid ultrasound for now. Of note patient has taken prednisone 10-20 mg daily for the last more than a year currently tapering to 15 mg. She has cushingoid appearance. The risk of adrenal insufficiency from  steroid withdrawal  briefly discussed with her. She will likely require low dose steroid replacement for life.    she has history  of chronic heavy smoking with risk of COPD. I have counseled her against smoking. -She is now on Fosamax for osteoporosis being followed by rheumatology. - I advised patient to maintain close follow up with 02/27/2015, PA-C for primary care needs. Follow up plan: Return in about 6 months (around 09/05/2015) for follow up with pre-visit labs.  09/07/2015, MD Phone: [phone]  Fax: [phone]   03/08/2015, 3:50 PM

## 2015-03-08 NOTE — ED Provider Notes (Signed)
CSN: [id number]     Arrival date & time 03/08/15  1642 History   First MD Initiated Contact with Patient 03/08/15 1654     Chief Complaint  Patient presents with  . Fall     (Consider location/radiation/quality/duration/timing/severity/associated sxs/prior Treatment) The history is provided by the patient.   [name] is a 50 y.o. female with a history of rheumatoid arthritis and severe osteoporosis and known stress fractures of her right pelvis and her left foot presenting for evaluation of worsening pain in her right pelvis since slipping just prior to arrival without falling.  She states she was walking using  her walker when her cam walker (on left foot) dragged on the ground causing her to stumble forward, but she did not fall as she caught herself with her walker.   Since this occurrence she has had increased pain in her right groin and is concerned she may have done further damage to her already known fracture.  She denies radiation of pain.  It is worsened with weightbearing.  She has had no treatments for this since arrival.    Past Medical History  Diagnosis Date  . Rheumatoid arthritis(714.0)   . Chronic anemia   . Hypertension   . Arthritis, rheumatoid (HCC)   . Incidental lung nodule   . Depression   . Chronic pain   . COPD (chronic obstructive pulmonary disease) [facility])    Past Surgical History  Procedure Laterality Date  . Tubal ligation    . Endometrial ablation    . Breast cyst excision  12/31/2010    Procedure: CYST EXCISION BREAST;  Surgeon: Fabio Bering, MD;  Location: [facility];  Service: General;  Laterality: Right;  Excision Sebaceous Cyst Right Breast   Family History  Problem Relation Age of Onset  . Anesthesia problems Neg Hx   . Cancer Father    Social History  Substance Use Topics  . Smoking status: Current Every Day Smoker -- 1.00 packs/day for 30 years    Types: Cigarettes  . Smokeless tobacco: Never Used     Comment: has cut back  .  Alcohol Use: No   OB History    Gravida Para Term Preterm AB TAB SAB Ectopic Multiple Living   6 5 5  1  1   5      Review of Systems  Constitutional: Negative for fever.  Musculoskeletal: Positive for arthralgias. Negative for myalgias and joint swelling.  Neurological: Negative for weakness and numbness.      Allergies  Voltaren and Codeine  Home Medications   Prior to Admission medications   Medication Sig Start Date End Date Taking? Authorizing Provider  albuterol (PROVENTIL HFA;VENTOLIN HFA) 108 (90 BASE) MCG/ACT inhaler Inhale 2 puffs into the lungs every 6 (six) hours as needed for wheezing or shortness of breath.    Historical Provider, MD  alendronate (FOSAMAX) 70 MG tablet Take 70 mg by mouth once a week. Take with a full glass of water on an empty stomach.(Monday)    Historical Provider, MD  folic acid (FOLVITE) 1 MG tablet Take 1 mg by mouth daily.    Historical Provider, MD  HYDROcodone-acetaminophen (NORCO) 7.5-325 MG tablet Take 1 tablet by mouth every 4 (four) hours as needed for moderate pain (Must last 30 days.  Do not drive or operate machinery while taking this medicine.). 02/20/15   04/20/15, MD  methotrexate (RHEUMATREX) 2.5 MG tablet Take 15 mg by mouth once a week. Caution:Chemotherapy. Protect from light.(Thursday)  Historical Provider, MD  Multiple Vitamin (MULTIVITAMIN WITH MINERALS) TABS Take 1 tablet by mouth daily.    Historical Provider, MD  Omega-3 Fatty Acids (FISH OIL) 1000 MG CAPS Take 1,000 mg by mouth daily.    Historical Provider, MD  omeprazole (PRILOSEC) 40 MG capsule Take 1 capsule (40 mg total) by mouth daily. 12/27/14   Jacquelin Hawking, PA-C  predniSONE (DELTASONE) 10 MG tablet Take 15 mg by mouth See admin instructions. Taper of taking 25mg  daily for 2 weeks, then to take 20mg  daily thereafter    Historical Provider, MD  Vitamin D, Ergocalciferol, (DRISDOL) 50000 units CAPS capsule Take 50,000 Units by mouth once a week. Takes on Mondays.  02/14/15   Historical Provider, MD   BP 126/92 mmHg  Pulse 121  Temp(Src) 98 F (36.7 C) (Oral)  Resp 16  Ht 5\' 5"  (1.651 m)  Wt 77.111 kg  BMI 28.29 kg/m2  SpO2 100% Physical Exam  Constitutional: She appears well-developed and well-nourished.  HENT:  Head: Atraumatic.  Neck: Normal range of motion.  Cardiovascular:  Pulses equal bilaterally  Musculoskeletal: She exhibits tenderness. She exhibits no edema.       Right hip: She exhibits tenderness. She exhibits normal range of motion.  Pain with external rotation of the right hip.  Internal rotation is tolerated fairly well.  Distal sensation is intact.  Dorsalis pedis pulses are intact.  Neurological: She is alert. She has normal strength. She displays normal reflexes. No sensory deficit.  Skin: Skin is warm and dry.  Psychiatric: She has a normal mood and affect.    ED Course  Procedures (including critical care time) Labs Review Labs Reviewed - No data to display  Imaging Review Dg Hip Unilat W Or W/o Pelvis 2-3 Views Right  03/08/2015  CLINICAL DATA:  50 year old who fell while leaving her doctor's office earlier today, injuring the right hip, pain in the right groin area. EXAM: DG HIP (WITH OR WITHOUT PELVIS) 2-3V RIGHT COMPARISON:  None. FINDINGS: Nondisplaced fractures involving the right superior and inferior pubic rami. No fractures involving the proximal femur. No significant joint space narrowing in the right hip. Symmetric normal-appearing contralateral left hip joint. Sacroiliac joints and symphysis pubis intact. Mild degenerative changes involving the visualized lower lumbar spine. IMPRESSION: Nondisplaced fractures involving the right superior and inferior pubic rami. Electronically Signed   By: M.D.   On: 03/08/2015 17:40   I have personally reviewed and evaluated these images and lab results as part of my medical decision-making.   EKG Interpretation None      MDM   Final diagnoses:  Pelvic  fracture, with routine healing, subsequent encounter   Imaging reviewed with stable appearing superior and inferior pubic rami.  Advised activity as tolerated, use care with walking, using walker at all times. F/u with Dr. 57 prn who is treating this injury.  Continue with home medicines.  She was given a toradol injection here.    Hulan Saas, PA-C 03/08/15 1916  Hilda Lias, PA-C 03/08/15 1918  03/10/15, DO 03/12/15 1228

## 2015-03-08 NOTE — Discharge Instructions (Signed)
Simple Pelvic Fracture, Adult °A pelvic fracture is a break in one of the pelvic bones. The pelvic bones include the bones that you sit on and the bones that make up the lower part of your spine. A pelvic fracture is called simple if the broken bones are stable and are not moving out of place. °CAUSES  °Common causes of this type of fracture include: °· A fall. °· A car accident. °· Force or pressure applied to the pelvis. °RISK FACTORS °You may be at higher risk for this type of fracture if: °· You play high-impact sports. °· You are an older person with a condition that causes weak bones (osteoporosis). °· You have a bone-weakening disease. °SIGNS AND SYMPTOMS °Signs and symptoms may include: °· Tenderness, swelling, or bruising in the affected area. °· Pain when moving the hip. °· Pain when walking or standing. °DIAGNOSIS °A diagnosis is made with a physical exam and X-rays. Sometimes, a CT scan is also done. °TREATMENT °The goal of treatment is to get the bones to heal in a good position. Treatment of a simple pelvic fracture usually involves staying in bed (bed rest) and using crutches or a walker until the bones heal. Medicines may be prescribed for pain. Medicines may also be prescribed that help to prevent blood clots from forming in the legs. °HOME CARE INSTRUCTIONS °Managing Pain, Stiffness, and Swelling °· If directed, apply ice to the injured area: °¨ Put ice in a plastic bag. °¨ Place a towel between your skin and the bag. °¨ Leave the ice on for 20 minutes, 2-3 times a day. °· Raise the injured area above the level of your heart while you are sitting or lying down. °Driving °· Do not  drive or operate heavy machinery until your health care provider tells you it is safe to do. °Activity °· Stay on bed rest for as long as directed by your health care provider. °· While on bed rest: °¨ Change the position of your legs every 1-2 hours. This keeps blood moving well through both of your legs. °¨ You may sit  for as long as you feel comfortable. °· After bed rest: °¨ Avoid strenuous activities for as long as directed by your health care provider. °¨ Return to your normal activities as directed by your health care provider. Ask your health care provider what activities are safe for you. °Safety °· Do not use the injured limb to support your body weight until your health care provider says that you can. Use crutches or a walker as directed by your health care provider. °General Instructions °· Do not use any tobacco products, including cigarettes, chewing tobacco, or electronic cigarettes. Tobacco can delay bone healing. If you need help quitting, ask your health care provider. °· Take medicines only as directed by your health care provider. °· Keep all follow-up visits as directed by your health care provider. This is important. °SEEK MEDICAL CARE IF: °· Your pain gets worse. °· Your pain is not relieved with medicines. °SEEK IMMEDIATE MEDICAL CARE IF: °· You feel light-headed or faint. °· You develop chest pain. °· You develop shortness of breath. °· You have a fever. °· You have blood in your urine or your stools. °· You have vaginal bleeding. °· You have difficulty or pain with urination or with a bowel movement. °· You have difficulty or increased pain with walking. °· You have new or increased swelling in one of your legs. °· You have numbness in your   legs or groin area. °  °This information is not intended to replace advice given to you by your health care provider. Make sure you discuss any questions you have with your health care provider. °  °Document Released: 03/09/2001 Document Revised: 01/19/2014 Document Reviewed: 08/22/2013 °Elsevier Interactive Patient Education ©2016 [employer] ° °

## 2015-03-08 NOTE — ED Notes (Signed)
Larey Seat today with walking boot injuring right hip.  Rates pain 8/10.  Denies any dizziness.

## 2015-03-10 ENCOUNTER — Emergency Department (HOSPITAL_COMMUNITY)
Admission: EM | Admit: 2015-03-10 | Discharge: 2015-03-10 | Disposition: A | Discharge status: Home / Self Care | Payer: Self-pay | Attending: Emergency Medicine | Admitting: Emergency Medicine

## 2015-03-10 ENCOUNTER — Encounter (HOSPITAL_COMMUNITY): Payer: Self-pay | Admitting: Emergency Medicine

## 2015-03-10 DIAGNOSIS — M549 Dorsalgia, unspecified: Secondary | ICD-10-CM

## 2015-03-10 DIAGNOSIS — F17210 Nicotine dependence, cigarettes, uncomplicated: Secondary | ICD-10-CM | POA: Insufficient documentation

## 2015-03-10 DIAGNOSIS — D649 Anemia, unspecified: Secondary | ICD-10-CM | POA: Insufficient documentation

## 2015-03-10 DIAGNOSIS — G8929 Other chronic pain: Secondary | ICD-10-CM | POA: Insufficient documentation

## 2015-03-10 DIAGNOSIS — Z87828 Personal history of other (healed) physical injury and trauma: Secondary | ICD-10-CM | POA: Insufficient documentation

## 2015-03-10 DIAGNOSIS — I10 Essential (primary) hypertension: Secondary | ICD-10-CM | POA: Insufficient documentation

## 2015-03-10 DIAGNOSIS — M069 Rheumatoid arthritis, unspecified: Secondary | ICD-10-CM | POA: Insufficient documentation

## 2015-03-10 DIAGNOSIS — Z8781 Personal history of (healed) traumatic fracture: Secondary | ICD-10-CM | POA: Insufficient documentation

## 2015-03-10 DIAGNOSIS — J449 Chronic obstructive pulmonary disease, unspecified: Secondary | ICD-10-CM | POA: Insufficient documentation

## 2015-03-10 DIAGNOSIS — Z8659 Personal history of other mental and behavioral disorders: Secondary | ICD-10-CM | POA: Insufficient documentation

## 2015-03-10 DIAGNOSIS — Z79899 Other long term (current) drug therapy: Secondary | ICD-10-CM | POA: Insufficient documentation

## 2015-03-10 DIAGNOSIS — M545 Low back pain: Secondary | ICD-10-CM | POA: Insufficient documentation

## 2015-03-10 MED ORDER — KETOROLAC TROMETHAMINE 60 MG/2ML IM SOLN
30.0000 mg | Freq: Once | INTRAMUSCULAR | Status: AC
  Administered 2015-03-10: 30 mg via INTRAMUSCULAR
  Filled 2015-03-10: qty 2

## 2015-03-10 MED ORDER — OXYCODONE-ACETAMINOPHEN 5-325 MG PO TABS
1.0000 | ORAL_TABLET | Freq: Once | ORAL | Status: AC
  Administered 2015-03-10: 1 via ORAL
  Filled 2015-03-10: qty 1

## 2015-03-10 NOTE — ED Notes (Signed)
Patient complaining of lower back pain that has worsened since yesterday. Patient ambulatory with walker at triage.

## 2015-03-10 NOTE — Discharge Instructions (Signed)
Call Dr. Hilda Lias in the morning for follow up

## 2015-03-10 NOTE — ED Provider Notes (Signed)
CSN: [id number]     Arrival date & time 03/10/15  1721 History  By signing my name below, I, Ronney Lion, attest that this documentation has been prepared under the direction and in the presence of Kerrie Buffalo, NP. Electronically Signed: Ronney Lion, ED Scribe. 03/10/2015. 6:15 PM.    Chief Complaint  Patient presents with  . Back Pain   Patient is a 50 y.o. female presenting with back pain. The history is provided by the patient and medical records. No language interpreter was used.  Back Pain Location:  Lumbar spine Quality:  Aching Radiates to:  Does not radiate Pain severity:  Severe Onset quality:  Sudden Duration:  1 day Timing:  Constant Chronicity:  Chronic Context comment:  Slipping and catching herself while walking Relieved by:  Nothing Worsened by:  Nothing tried Ineffective treatments: hydrocodone 7.5 mg. Associated symptoms: no fever     HPI Comments: [name] is a 50 y.o. female who presents to the Emergency Department complaining of constant, severe, lower back pain that has worsened since being seen here yesterday S/P slipping PTA without falling. Per medical records, patient states she was using her walker when her CAM walker on her left foot dragged on the ground, causing her to stumble forward, although she caught herself with her walker. Patient had taken hydrocodone 7.5 mg, which she last took this afternoon, with no relief. She reports her symptoms began when she fractured her sacrum several months ago while painting, which was diagnosed with a pelvic CT here in the ED. She notes she has been taking prednisone 10 mg for RA. She states she has also been found to have low bone density. Patient states she last had a CT pelvis scan on 02/27/15, ordered by Dr. Hilda Lias. She had a right hip XR 03/08/15 yesterday while in the ED, which showed "Nondisplaced fractures involving the right superior and inferior pubic rami," per medical records. She states her fracture is being  followed by Dr. Hilda Lias, who last gave her 120 x hydrocodone 7.5 mg.   Past Medical History  Diagnosis Date  . Rheumatoid arthritis(714.0)   . Chronic anemia   . Hypertension   . Arthritis, rheumatoid (HCC)   . Incidental lung nodule   . Depression   . Chronic pain   . COPD (chronic obstructive pulmonary disease) [facility])    Past Surgical History  Procedure Laterality Date  . Tubal ligation    . Endometrial ablation    . Breast cyst excision  12/31/2010    Procedure: CYST EXCISION BREAST;  Surgeon: Fabio Bering, MD;  Location: [facility];  Service: General;  Laterality: Right;  Excision Sebaceous Cyst Right Breast   Family History  Problem Relation Age of Onset  . Anesthesia problems Neg Hx   . Cancer Father    Social History  Substance Use Topics  . Smoking status: Current Every Day Smoker -- 1.00 packs/day for 30 years    Types: Cigarettes  . Smokeless tobacco: Never Used     Comment: has cut back  . Alcohol Use: No   OB History    Gravida Para Term Preterm AB TAB SAB Ectopic Multiple Living   6 5 5  1  1   5      Review of Systems  Constitutional: Negative for fever and chills.  Musculoskeletal: Positive for back pain.      Allergies  Voltaren and Codeine  Home Medications   Prior to Admission medications   Medication Sig  Start Date End Date Taking? Authorizing Provider  albuterol (PROVENTIL HFA;VENTOLIN HFA) 108 (90 BASE) MCG/ACT inhaler Inhale 2 puffs into the lungs every 6 (six) hours as needed for wheezing or shortness of breath.    Historical Provider, MD  alendronate (FOSAMAX) 70 MG tablet Take 70 mg by mouth once a week. Take with a full glass of water on an empty stomach.(Monday)    Historical Provider, MD  folic acid (FOLVITE) 1 MG tablet Take 1 mg by mouth daily.    Historical Provider, MD  HYDROcodone-acetaminophen (NORCO) 7.5-325 MG tablet Take 1 tablet by mouth every 4 (four) hours as needed for moderate pain (Must last 30 days.  Do not drive or  operate machinery while taking this medicine.). 02/20/15   Darreld Mclean, MD  methotrexate (RHEUMATREX) 2.5 MG tablet Take 15 mg by mouth once a week. Caution:Chemotherapy. Protect from light.(Thursday)    Historical Provider, MD  Multiple Vitamin (MULTIVITAMIN WITH MINERALS) TABS Take 1 tablet by mouth daily.    Historical Provider, MD  Omega-3 Fatty Acids (FISH OIL) 1000 MG CAPS Take 1,000 mg by mouth daily.    Historical Provider, MD  omeprazole (PRILOSEC) 40 MG capsule Take 1 capsule (40 mg total) by mouth daily. 12/27/14   Jacquelin Hawking, PA-C  predniSONE (DELTASONE) 10 MG tablet Take 15 mg by mouth See admin instructions. Taper of taking 25mg  daily for 2 weeks, then to take 20mg  daily thereafter    Historical Provider, MD  Vitamin D, Ergocalciferol, (DRISDOL) 50000 units CAPS capsule Take 50,000 Units by mouth once a week. Takes on Mondays. 02/14/15   Historical Provider, MD   BP 140/92 mmHg  Pulse 70  Temp(Src) 98.1 F (36.7 C) (Oral)  Resp 18  SpO2 97% Physical Exam  Constitutional: She is oriented to person, place, and time. She appears well-developed and well-nourished. No distress.  HENT:  Head: Normocephalic and atraumatic.  Eyes: Conjunctivae and EOM are normal.  Neck: Neck supple. No tracheal deviation present.  Cardiovascular: Normal rate.   Pulmonary/Chest: Effort normal. No respiratory distress.  Musculoskeletal: Normal range of motion.       Lumbar back: She exhibits tenderness. She exhibits no spasm and normal pulse. Decreased range of motion: due to pain.  Pedal pulses 2+.   Neurological: She is alert and oriented to person, place, and time. She has normal strength.  Reflex Scores:      Patellar reflexes are 2+ on the right side and 2+ on the left side. Pain with ambulation, patient using a walker, no foot drag.   Skin: Skin is warm and dry.  Psychiatric: She has a normal mood and affect. Her behavior is normal.  Nursing note and vitals reviewed.   ED Course   Procedures (including critical care time)  DIAGNOSTIC STUDIES: Oxygen Saturation is 97% on RA, normal by my interpretation.    COORDINATION OF CARE: 5:59 PM - Discussed treatment plan with pt at bedside which includes dosage of Percocet administered here. Pt verbalized understanding and agreed to plan.  Discussed with the patient that we do not treat chronic pain in the ED and she will need to call Dr. Romeo Apple tomorrow for further pain management.   MDM  50 y.o. female with hx of pelvic fracture and chronic low back pain stable for d/c without focal neuro deficits. She will follow up with Dr. Romeo Apple tomorrow.   Final diagnoses:  Chronic back pain greater than 3 months duration   I personally performed the services described in this  documentation, which was scribed in my presence. The recorded information has been reviewed and is accurate.     [facility] Orlene Och, NP 03/10/15 2104  Samuel Jester, DO 03/13/15 2122

## 2015-03-11 ENCOUNTER — Encounter: Payer: Self-pay | Admitting: Physician Assistant

## 2015-03-11 ENCOUNTER — Ambulatory Visit: Payer: Self-pay | Admitting: Physician Assistant

## 2015-03-11 VITALS — BP 134/92 | HR 103 | Temp 98.1°F | Ht 65.0 in | Wt 161.4 lb

## 2015-03-11 DIAGNOSIS — F17218 Nicotine dependence, cigarettes, with other nicotine-induced disorders: Secondary | ICD-10-CM

## 2015-03-11 DIAGNOSIS — M069 Rheumatoid arthritis, unspecified: Secondary | ICD-10-CM

## 2015-03-11 DIAGNOSIS — M810 Age-related osteoporosis without current pathological fracture: Secondary | ICD-10-CM

## 2015-03-11 DIAGNOSIS — F4321 Adjustment disorder with depressed mood: Secondary | ICD-10-CM

## 2015-03-11 MED ORDER — VARENICLINE TARTRATE 0.5 MG X 11 & 1 MG X 42 PO MISC: ORAL | Status: DC

## 2015-03-11 MED ORDER — VARENICLINE TARTRATE 1 MG PO TABS: 1.0000 mg | ORAL_TABLET | Freq: Two times a day (BID) | ORAL | Status: DC

## 2015-03-11 NOTE — Patient Instructions (Signed)
Varenicline oral tablets What is this medicine? VARENICLINE (var EN i kleen) is used to help people quit smoking. It can reduce the symptoms caused by stopping smoking. It is used with a patient support program recommended by your physician. This medicine may be used for other purposes; ask your health care provider or pharmacist if you have questions. What should I tell my health care provider before I take this medicine? They need to know if you have any of these conditions: -bipolar disorder, depression, schizophrenia or other mental illness -heart disease -if you often drink alcohol -kidney disease -peripheral vascular disease -seizures -stroke -suicidal thoughts, plans, or attempt; a previous suicide attempt by you or a family member -an unusual or allergic reaction to varenicline, other medicines, foods, dyes, or preservatives -pregnant or trying to get pregnant -breast-feeding How should I use this medicine? Take this medicine by mouth after eating. Take with a full glass of water. Follow the directions on the prescription label. Take your doses at regular intervals. Do not take your medicine more often than directed. There are 3 ways you can use this medicine to help you quit smoking; talk to your health care professional to decide which plan is right for you: 1) you can choose a quit date and start this medicine 1 week before the quit date, or, 2) you can start taking this medicine before you choose a quit date, and then pick a quit date between day 8 and 35 days of treatment, or, 3) if you are not sure that you are able or willing to quit smoking right away, start taking this medicine and slowly decrease the amount you smoke as directed by your health care professional with the goal of being cigarette-free by week 12 of treatment. Stick to your plan; ask about support groups or other ways to help you remain cigarette-free. If you are motivated to quit smoking and did not succeed  during a previous attempt with this medicine for reasons other than side effects, or if you returned to smoking after this treatment, speak with your health care professional about whether another course of this medicine may be right for you. A special MedGuide will be given to you by the pharmacist with each prescription and refill. Be sure to read this information carefully each time. Talk to your pediatrician regarding the use of this medicine in children. This medicine is not approved for use in children. Overdosage: If you think you have taken too much of this medicine contact a poison control center or emergency room at once. NOTE: This medicine is only for you. Do not share this medicine with others. What if I miss a dose? If you miss a dose, take it as soon as you can. If it is almost time for your next dose, take only that dose. Do not take double or extra doses. What may interact with this medicine? -alcohol or any product that contains alcohol -insulin -other stop smoking aids -theophylline -warfarin This list may not describe all possible interactions. Give your health care provider a list of all the medicines, herbs, non-prescription drugs, or dietary supplements you use. Also tell them if you smoke, drink alcohol, or use illegal drugs. Some items may interact with your medicine. What should I watch for while using this medicine? Visit your doctor or health care professional for regular check ups. Ask for ongoing advice and encouragement from your doctor or healthcare professional, friends, and family to help you quit. If you smoke while on   this medication, quit again Your mouth may get dry. Chewing sugarless gum or sucking hard candy, and drinking plenty of water may help. Contact your doctor if the problem does not go away or is severe. You may get drowsy or dizzy. Do not drive, use machinery, or do anything that needs mental alertness until you know how this medicine affects you. Do  not stand or sit up quickly, especially if you are an older patient. This reduces the risk of dizzy or fainting spells. Sleepwalking can happen during treatment with this medicine, and can sometimes lead to behavior that is harmful to you, other people, or property. Stop taking this medicine and tell your doctor if you start sleepwalking or have other unusual sleep-related activity. Decrease the amount of alcoholic beverages that you drink during treatment with this medicine until you know if this medicine affects your ability to tolerate alcohol. Some people have experienced increased drunkenness (intoxication), unusual or sometimes aggressive behavior, or no memory of things that have happened (amnesia) during treatment with this medicine. The use of this medicine may increase the chance of suicidal thoughts or actions. Pay special attention to how you are responding while on this medicine. Any worsening of mood, or thoughts of suicide or dying should be reported to your health care professional right away. What side effects may I notice from receiving this medicine? Side effects that you should report to your doctor or health care professional as soon as possible: -allergic reactions like skin rash, itching or hives, swelling of the face, lips, tongue, or throat -acting aggressive, being angry or violent, or acting on dangerous impulses -breathing problems -changes in vision -chest pain or chest tightness -confusion, trouble speaking or understanding -new or worsening depression, anxiety, or panic attacks -extreme increase in activity and talking (mania) -fast, irregular heartbeat -feeling faint or lightheaded, falls -fever -pain in legs when walking -problems with balance, talking, walking -redness, blistering, peeling or loosening of the skin, including inside the mouth -ringing in ears -seeing or hearing things that aren't there (hallucinations) -seizures -sleepwalking -sudden numbness  or weakness of the face, arm or leg -thoughts about suicide or dying, or attempts to commit suicide -trouble passing urine or change in the amount of urine -unusual bleeding or bruising -unusually weak or tired Side effects that usually do not require medical attention (report to your doctor or health care professional if they continue or are bothersome): -constipation -headache -nausea, vomiting -strange dreams -stomach gas -trouble sleeping This list may not describe all possible side effects. Call your doctor for medical advice about side effects. You may report side effects to FDA at 1-800-FDA-1088. Where should I keep my medicine? Keep out of the reach of children. Store at room temperature between 15 and 30 degrees C (59 and 86 degrees F). Throw away any unused medicine after the expiration date. NOTE: This sheet is a summary. It may not cover all possible information. If you have questions about this medicine, talk to your doctor, pharmacist, or health care provider.    2016, Elsevier/Gold Standard. (2014-09-13 16:14:23)  

## 2015-03-11 NOTE — Progress Notes (Signed)
BP 134/92 mmHg  Pulse 103  Temp(Src) 98.1 F (36.7 C)  Ht 5\' 5"  (1.651 m)  Wt 161 lb 6.4 oz (73.211 kg)  BMI 26.86 kg/m2  SpO2 97%   Subjective:    Patient ID: , female    DOB: 19-Aug-1965, 50 y.o.   MRN: [id number]  HPI: [name] is a 50 y.o. female presenting on 03/11/2015 for Hypertension   HPI  Pt is in today b/c her bp has been elevated on some occassions in the recent past when she has been to the ER.  Pt denies cp, HA, vision changes.  She has had no change in breathing.  Pt has had multiple trips to ER recently as well as to ortho due to falls and fractures.  In February alone:  Xray- fracture Right superior ramus, fracture Right inferior ramus, fracture L cuboid. CT- chronic bilateral sacral ala fractures.  Pt states next appt with rheumatologist at [facility] is in April  Discussed with pt that she is still smoking.  Pt is aware that this is terrible for her osteoporosis and something that is controllable.  She says she would love to be a non-smoker.  She says she is ready- she also hates smelling of smoke when she holds her grandchildren.  Pt states that she has some depression related to all her medical problems.  Says she thinks she would benefit from counseling.  Denies SI, HI  Relevant past medical, surgical, family and social history reviewed and updated as indicated. Interim medical history since our last visit reviewed. Allergies and medications reviewed and updated.   Current outpatient prescriptions:  .  albuterol (PROVENTIL HFA;VENTOLIN HFA) 108 (90 BASE) MCG/ACT inhaler, Inhale 2 puffs into the lungs every 6 (six) hours as needed for wheezing or shortness of breath., Disp: , Rfl:  .  alendronate (FOSAMAX) 70 MG tablet, Take 70 mg by mouth once a week. Take with a full glass of water on an empty stomach.(Monday), Disp: , Rfl:  .  folic acid (FOLVITE) 1 MG tablet, Take 1 mg by mouth daily., Disp: , Rfl:  .  HYDROcodone-acetaminophen (NORCO) 7.5-325  MG tablet, Take 1 tablet by mouth every 4 (four) hours as needed for moderate pain (Must last 30 days.  Do not drive or operate machinery while taking this medicine.)., Disp: 120 tablet, Rfl: 0 .  methotrexate (RHEUMATREX) 2.5 MG tablet, Take 15 mg by mouth once a week. Caution:Chemotherapy. Protect from light.(Thursday), Disp: , Rfl:  .  Multiple Vitamin (MULTIVITAMIN WITH MINERALS) TABS, Take 1 tablet by mouth daily., Disp: , Rfl:  .  Omega-3 Fatty Acids (FISH OIL) 1000 MG CAPS, Take 1,000 mg by mouth daily., Disp: , Rfl:  .  omeprazole (PRILOSEC) 40 MG capsule, Take 1 capsule (40 mg total) by mouth daily., Disp: 90 capsule, Rfl: 1 .  predniSONE (DELTASONE) 10 MG tablet, Take 10 mg by mouth See admin instructions. Taper of taking 25mg  daily for 2 weeks, then to take 20mg  daily thereafter, Disp: , Rfl:  .  Vitamin D, Ergocalciferol, (DRISDOL) 50000 units CAPS capsule, Take 50,000 Units by mouth once a week. Takes on Mondays., Disp: , Rfl: 2   Review of Systems  Per HPI unless specifically indicated above     Objective:    BP 134/92 mmHg  Pulse 103  Temp(Src) 98.1 F (36.7 C)  Ht 5\' 5"  (1.651 m)  Wt 161 lb 6.4 oz (73.211 kg)  BMI 26.86 kg/m2  SpO2 97%  Wt  Readings from Last 3 Encounters:  03/11/15 161 lb 6.4 oz (73.211 kg)  03/08/15 170 lb (77.111 kg)  03/08/15 158 lb (71.668 kg)    Physical Exam  Constitutional: She is oriented to person, place, and time. She appears well-developed and well-nourished.  HENT:  Head: Normocephalic and atraumatic.  Neck: Neck supple.  Cardiovascular: Normal rate and regular rhythm.   Pulmonary/Chest: Effort normal and breath sounds normal.  Lymphadenopathy:    She has no cervical adenopathy.  Neurological: She is alert and oriented to person, place, and time. Gait (walking with a walker, wearing cam boot on L) abnormal.  Skin: Skin is warm and dry.  Psychiatric: She has a normal mood and affect. Her behavior is normal.  Vitals reviewed.        Assessment & Plan:    Encounter Diagnoses  Name Primary?  . Osteoporosis Yes  . Cigarette nicotine dependence with other nicotine-induced disorder   . Situational depression   . Rheumatoid arthritis, involving unspecified site, unspecified rheumatoid factor presence (HCC)     -discussed chantix.  She says it worked well for her in the past and she tolerated it well.  rx sent to [facility]  -Encouraged pt to get Counseling. gave [facility] a card for [facility] to call to get appointment  -Reviewed bp at many encounters- in er, ortho and here.  Overall, borderline high, as it is today.  Discussed with pt.  No rx.  Will continue to monitor.  -encouraged pt to f/u with her rheumatologist prior to April in light of her many recent fractures.  -f/u here April as scheduled.  RTO sooner prn

## 2015-03-12 ENCOUNTER — Ambulatory Visit (INDEPENDENT_AMBULATORY_CARE_PROVIDER_SITE_OTHER): Payer: Self-pay | Admitting: Orthopaedic Surgery

## 2015-03-12 ENCOUNTER — Telehealth: Payer: Self-pay | Admitting: Orthopedic Surgery

## 2015-03-12 VITALS — BP 140/96 | Temp 98.1°F | Ht 65.0 in | Wt 161.0 lb

## 2015-03-12 DIAGNOSIS — M80859D Other osteoporosis with current pathological fracture, unspecified femur, subsequent encounter for fracture with routine healing: Secondary | ICD-10-CM

## 2015-03-12 NOTE — Telephone Encounter (Signed)
Patient is inquiring about how she may get a refill of pain medication HYDROcodone-acetaminophen (NORCO) 7.5-325 MG tablet [[id number]] /quantity 120, 1 talblet every 4 hours; states not due till 03/20/15 to refill.  States she will be leaving Sunday morning,03/17/15, for 1 month, to [place].  Please advise regarding refill if any way to have the prescription filled prior to her leaving this weekend.  Patient ph# [phone]

## 2015-03-12 NOTE — Progress Notes (Signed)
CC:  I feel again  She was seen in the ER after a new fall at home.  She had no changes on x-rays.  She has pain of the pelvis and sacral area. She is using her walker.  I reassured her.    She is going to [place] to see relatives.  She has appointment to rheumatologist on April 5  She has pain medicine  Continue walker.  I will see April 11th here.  X-rays of pelvis then.

## 2015-03-12 NOTE — Patient Instructions (Signed)
She is going to [place] to visit relatives.  She has appointment to Rheumatology on April 5th  I will see her back on April 11th

## 2015-03-13 NOTE — Telephone Encounter (Signed)
Not due until 30 day notice.  She has been to ER and has gotten other pain medicines also.

## 2015-03-14 ENCOUNTER — Encounter (HOSPITAL_COMMUNITY): Payer: Self-pay | Admitting: *Deleted

## 2015-03-14 ENCOUNTER — Emergency Department (HOSPITAL_COMMUNITY)
Admission: EM | Admit: 2015-03-14 | Discharge: 2015-03-14 | Disposition: A | Discharge status: Home / Self Care | Payer: Self-pay | Attending: Emergency Medicine | Admitting: Emergency Medicine

## 2015-03-14 DIAGNOSIS — D649 Anemia, unspecified: Secondary | ICD-10-CM | POA: Insufficient documentation

## 2015-03-14 DIAGNOSIS — Z8781 Personal history of (healed) traumatic fracture: Secondary | ICD-10-CM | POA: Insufficient documentation

## 2015-03-14 DIAGNOSIS — G8929 Other chronic pain: Secondary | ICD-10-CM | POA: Insufficient documentation

## 2015-03-14 DIAGNOSIS — F17210 Nicotine dependence, cigarettes, uncomplicated: Secondary | ICD-10-CM | POA: Insufficient documentation

## 2015-03-14 DIAGNOSIS — Y92009 Unspecified place in unspecified non-institutional (private) residence as the place of occurrence of the external cause: Secondary | ICD-10-CM

## 2015-03-14 DIAGNOSIS — Y92002 Bathroom of unspecified non-institutional (private) residence single-family (private) house as the place of occurrence of the external cause: Secondary | ICD-10-CM | POA: Insufficient documentation

## 2015-03-14 DIAGNOSIS — W19XXXA Unspecified fall, initial encounter: Secondary | ICD-10-CM

## 2015-03-14 DIAGNOSIS — M069 Rheumatoid arthritis, unspecified: Secondary | ICD-10-CM | POA: Insufficient documentation

## 2015-03-14 DIAGNOSIS — J449 Chronic obstructive pulmonary disease, unspecified: Secondary | ICD-10-CM | POA: Insufficient documentation

## 2015-03-14 DIAGNOSIS — I10 Essential (primary) hypertension: Secondary | ICD-10-CM | POA: Insufficient documentation

## 2015-03-14 DIAGNOSIS — Y998 Other external cause status: Secondary | ICD-10-CM | POA: Insufficient documentation

## 2015-03-14 DIAGNOSIS — Y9389 Activity, other specified: Secondary | ICD-10-CM | POA: Insufficient documentation

## 2015-03-14 DIAGNOSIS — Z043 Encounter for examination and observation following other accident: Secondary | ICD-10-CM | POA: Insufficient documentation

## 2015-03-14 DIAGNOSIS — Z7952 Long term (current) use of systemic steroids: Secondary | ICD-10-CM | POA: Insufficient documentation

## 2015-03-14 DIAGNOSIS — W01198A Fall on same level from slipping, tripping and stumbling with subsequent striking against other object, initial encounter: Secondary | ICD-10-CM | POA: Insufficient documentation

## 2015-03-14 DIAGNOSIS — Z8659 Personal history of other mental and behavioral disorders: Secondary | ICD-10-CM | POA: Insufficient documentation

## 2015-03-14 DIAGNOSIS — Z79899 Other long term (current) drug therapy: Secondary | ICD-10-CM | POA: Insufficient documentation

## 2015-03-14 NOTE — Discharge Instructions (Signed)
As discussed, your evaluation today has been largely reassuring.  But, it is important that you monitor your condition carefully, and do not hesitate to return to the ED if you develop new, or concerning changes in your condition. ? ?Otherwise, please follow-up with your physician for appropriate ongoing care. ? ?

## 2015-03-14 NOTE — ED Notes (Signed)
Pt tripped and fell into vanity, hitting pubic area. Pt states she has previous pelvic fx and states since falling today, she is having swelling to pelvic area.

## 2015-03-14 NOTE — ED Provider Notes (Signed)
CSN: [id number]     Arrival date & time 03/14/15  1657 History   First MD Initiated Contact with Patient 03/14/15 1837     Chief Complaint  Patient presents with  . Fall     (Consider location/radiation/quality/duration/timing/severity/associated sxs/prior Treatment) HPI Vision presents a meal after a fall that occurred at home. Patient acknowledges multiple medical issues, including recently fractured left leg, pelvic fracture. She states that just prior to ED arrival she stumbled, fell against a bathroom vanity. Since that time she's had increasing pain in the right pelvis, right leg. She continues to have capacity to use her walker. No distal dysesthesia or weakness. No additional medication taken since the fall.  Past Medical History  Diagnosis Date  . Rheumatoid arthritis(714.0)   . Chronic anemia   . Hypertension   . Arthritis, rheumatoid (HCC)   . Incidental lung nodule   . Depression   . Chronic pain   . COPD (chronic obstructive pulmonary disease) [facility])    Past Surgical History  Procedure Laterality Date  . Tubal ligation    . Endometrial ablation    . Breast cyst excision  12/31/2010    Procedure: CYST EXCISION BREAST;  Surgeon: Fabio Bering, MD;  Location: [facility];  Service: General;  Laterality: Right;  Excision Sebaceous Cyst Right Breast   Family History  Problem Relation Age of Onset  . Anesthesia problems Neg Hx   . Cancer Father    Social History  Substance Use Topics  . Smoking status: Current Every Day Smoker -- 1.00 packs/day for 30 years    Types: Cigarettes  . Smokeless tobacco: Never Used     Comment: has cut back  . Alcohol Use: No   OB History    Gravida Para Term Preterm AB TAB SAB Ectopic Multiple Living   6 5 5  1  1   5      Review of Systems  Constitutional: Negative for fever.  Respiratory: Negative for shortness of breath.   Cardiovascular: Negative for chest pain.  Musculoskeletal:       Negative aside from HPI  Skin:    Negative aside from HPI  Allergic/Immunologic: Negative for immunocompromised state.  Neurological: Negative for weakness.      Allergies  Voltaren and Codeine  Home Medications   Prior to Admission medications   Medication Sig Start Date End Date Taking? Authorizing Provider  albuterol (PROVENTIL HFA;VENTOLIN HFA) 108 (90 BASE) MCG/ACT inhaler Inhale 2 puffs into the lungs every 6 (six) hours as needed for wheezing or shortness of breath.    Historical Provider, MD  alendronate (FOSAMAX) 70 MG tablet Take 70 mg by mouth once a week. Take with a full glass of water on an empty stomach.(Monday)    Historical Provider, MD  folic acid (FOLVITE) 1 MG tablet Take 1 mg by mouth daily.    Historical Provider, MD  HYDROcodone-acetaminophen (NORCO) 7.5-325 MG tablet Take 1 tablet by mouth every 4 (four) hours as needed for moderate pain (Must last 30 days.  Do not drive or operate machinery while taking this medicine.). 02/20/15   04/20/15, MD  methotrexate (RHEUMATREX) 2.5 MG tablet Take 15 mg by mouth once a week. Caution:Chemotherapy. Protect from light.(Thursday)    Historical Provider, MD  Multiple Vitamin (MULTIVITAMIN WITH MINERALS) TABS Take 1 tablet by mouth daily.    Historical Provider, MD  Omega-3 Fatty Acids (FISH OIL) 1000 MG CAPS Take 1,000 mg by mouth daily.    Historical Provider, MD  omeprazole (PRILOSEC) 40 MG capsule Take 1 capsule (40 mg total) by mouth daily. 12/27/14   Jacquelin Hawking, PA-C  predniSONE (DELTASONE) 10 MG tablet Take 10 mg by mouth See admin instructions. Taper of taking 25mg  daily for 2 weeks, then to take 20mg  daily thereafter    Historical Provider, MD  varenicline (CHANTIX CONTINUING MONTH PAK) 1 MG tablet Take 1 tablet (1 mg total) by mouth 2 (two) times daily. 03/11/15   , PA-C  varenicline (CHANTIX PAK) 0.5 MG X 11 & 1 MG X 42 tablet Take one 0.5 mg tab by mouth qd for 3 days, then increase to one 0.5 mg tablet bid for 4 days, then  increase to one 1 mg tablet twice daily. 03/11/15   Jacquelin Hawking, PA-C  Vitamin D, Ergocalciferol, (DRISDOL) 50000 units CAPS capsule Take 50,000 Units by mouth once a week. Takes on Mondays. 02/14/15   Historical Provider, MD   BP 155/89 mmHg  Pulse 82  Temp(Src) 98.6 F (37 C) (Oral)  Resp 16  Ht 5\' 6"  (1.676 m)  Wt 170 lb (77.111 kg)  BMI 27.45 kg/m2  SpO2 97% Physical Exam  Constitutional: She is oriented to person, place, and time. She appears well-developed and well-nourished. No distress.  HENT:  Head: Normocephalic and atraumatic.  Eyes: Conjunctivae and EOM are normal.  Cardiovascular: Normal rate and regular rhythm.   Pulmonary/Chest: Effort normal and breath sounds normal. No stridor. No respiratory distress.  Abdominal: She exhibits no distension.  Musculoskeletal: She exhibits no edema.  Left distal lower extremity in Cam Walker boot Patient flexes and extends each hip independently, has preserved internal and external rotation of the right hip, no appreciable deformity of the right hip.  Neurological: She is alert and oriented to person, place, and time. No cranial nerve deficit.  Skin: Skin is warm and dry.  Psychiatric: She has a normal mood and affect.  Nursing note and vitals reviewed.   ED Course  Procedures (including critical care time)  EMR notable for 20 ED visits in 6 months  MDM   Final diagnoses:  Fall in home, initial encounter   Patient with multiple medical issues, including prior pelvic fracture presents after a fall. Here, no evidence for new fracture on physical exam, the patient remains ambulatory, with her walker, Cam Walker. No deformity, no distal neurovascular compromise. Patient will follow up with orthopedics.  04-10-1971, MD 03/14/15 [phone]

## 2015-03-18 NOTE — Telephone Encounter (Signed)
Called patient approximately 12:15p.m; left voice mail message to return call.

## 2015-03-27 ENCOUNTER — Other Ambulatory Visit: Payer: Self-pay | Admitting: Physician Assistant

## 2015-03-28 NOTE — Telephone Encounter (Signed)
03/27/15 Patient called, just before our power went off - again requested either pain medication or Tylenol 3 or something for pain.  Relayed Dr Hilda Lias is out until next week, and that Dr Romeo Apple is covering.  Per Dr Romeo Apple, no medication.  Keep scheduled appointment with Dr Hilda Lias.

## 2015-04-02 ENCOUNTER — Ambulatory Visit: Payer: Self-pay | Admitting: Orthopaedic Surgery

## 2015-04-09 ENCOUNTER — Ambulatory Visit (INDEPENDENT_AMBULATORY_CARE_PROVIDER_SITE_OTHER): Payer: Self-pay

## 2015-04-09 ENCOUNTER — Ambulatory Visit (INDEPENDENT_AMBULATORY_CARE_PROVIDER_SITE_OTHER): Payer: Self-pay | Admitting: Orthopaedic Surgery

## 2015-04-09 ENCOUNTER — Encounter: Payer: Self-pay | Admitting: Orthopaedic Surgery

## 2015-04-09 VITALS — BP 167/103 | HR 105 | Temp 98.1°F | Resp 14 | Ht 65.0 in | Wt 165.4 lb

## 2015-04-09 DIAGNOSIS — M80859D Other osteoporosis with current pathological fracture, unspecified femur, subsequent encounter for fracture with routine healing: Secondary | ICD-10-CM

## 2015-04-09 DIAGNOSIS — M25571 Pain in right ankle and joints of right foot: Secondary | ICD-10-CM

## 2015-04-09 MED ORDER — HYDROCODONE-ACETAMINOPHEN 7.5-325 MG PO TABS: 1.0000 | ORAL_TABLET | ORAL | Status: DC | PRN

## 2015-04-09 NOTE — Progress Notes (Signed)
Patient [name], female DOB:[birth year], 50 y.o. VWU:[id number]  Chief Complaint  Patient presents with  . Back Pain    follow up    HPI  [name] is a 50 y.o. female is seen for follow-up on her pelvis/sacral fracture.  She is doing well from that.  She has a new problem. She slipped and hurt her right ankle several days ago.  She has had pain.  The swelling has gone done but she is still very tender laterally.  She has used ice, elevation.  She has a walker.  She is out of her pain medicine.  Ankle Injury  The incident occurred 5 to 7 days ago. The incident occurred at home. The injury mechanism was an inversion injury. The pain is present in the left ankle. The quality of the pain is described as aching. The pain is at a severity of 3/10. The pain is mild. The pain has been improving since onset. Associated symptoms include an inability to bear weight and a loss of motion. Pertinent negatives include no loss of sensation, muscle weakness, numbness or tingling. The symptoms are aggravated by weight bearing. She has tried ice, heat, elevation and non-weight bearing for the symptoms. The treatment provided moderate relief.    Body mass index is 27.52 kg/(m^2).  Review of Systems  Constitutional: Positive for fatigue.       Patient does not have Diabetes Mellitus. Patient has hypertension. Patient has COPD or shortness of breath. Patient does not have BMI > 35. Patient has current smoking history.  HENT: Negative for congestion.   Respiratory: Negative for cough and shortness of breath.   Cardiovascular: Negative for chest pain.  Endocrine: Positive for cold intolerance.  Musculoskeletal: Positive for myalgias, back pain, joint swelling, arthralgias and gait problem.       She has rheumatoid arthritis.  Long term steroid use.  Neurological: Negative for tingling and numbness.    Past Medical History  Diagnosis Date  . Rheumatoid arthritis(714.0)   . Chronic anemia   .  Hypertension   . Arthritis, rheumatoid (HCC)   . Incidental lung nodule   . Depression   . Chronic pain   . COPD (chronic obstructive pulmonary disease) [facility])     Past Surgical History  Procedure Laterality Date  . Tubal ligation    . Endometrial ablation    . Breast cyst excision  12/31/2010    Procedure: CYST EXCISION BREAST;  Surgeon: Fabio Bering, MD;  Location: [facility];  Service: General;  Laterality: Right;  Excision Sebaceous Cyst Right Breast    Family History  Problem Relation Age of Onset  . Anesthesia problems Neg Hx   . Cancer Father     Social History Social History  Substance Use Topics  . Smoking status: Current Every Day Smoker -- 1.00 packs/day for 30 years    Types: Cigarettes  . Smokeless tobacco: Never Used     Comment: has cut back  . Alcohol Use: No    Allergies  Allergen Reactions  . Voltaren [Diclofenac Sodium] Nausea And Vomiting  . Codeine Itching and Rash    Rash occurred when taking tylenol #3.    Current Outpatient Prescriptions  Medication Sig Dispense Refill  . alendronate (FOSAMAX) 70 MG tablet Take 70 mg by mouth once a week. Take with a full glass of water on an empty stomach.(Monday)    . folic acid (FOLVITE) 1 MG tablet Take 1 mg by mouth daily.    Marland Kitchen  HYDROcodone-acetaminophen (NORCO) 7.5-325 MG tablet Take 1 tablet by mouth every 4 (four) hours as needed for moderate pain (Must last 30 days.  Do not drive or operate machinery while taking this medicine.). 120 tablet 0  . methotrexate (RHEUMATREX) 2.5 MG tablet Take 15 mg by mouth once a week. Caution:Chemotherapy. Protect from light.(Thursday)    . Multiple Vitamin (MULTIVITAMIN WITH MINERALS) TABS Take 1 tablet by mouth daily.    Marland Kitchen omeprazole (PRILOSEC) 20 MG capsule TAKE 2 Capsules BY MOUTH ONCE DAILY 180 capsule 1  . omeprazole (PRILOSEC) 40 MG capsule Take 1 capsule (40 mg total) by mouth daily. 90 capsule 1  . predniSONE (DELTASONE) 20 MG tablet Take 20 mg by mouth daily with  breakfast.    . Vitamin D, Ergocalciferol, (DRISDOL) 50000 units CAPS capsule Take 50,000 Units by mouth once a week. Takes on Mondays.  2  . varenicline (CHANTIX CONTINUING MONTH PAK) 1 MG tablet Take 1 tablet (1 mg total) by mouth 2 (two) times daily. (Patient not taking: Reported on 04/09/2015) 180 tablet 1  . varenicline (CHANTIX PAK) 0.5 MG X 11 & 1 MG X 42 tablet Take one 0.5 mg tab by mouth qd for 3 days, then increase to one 0.5 mg tablet bid for 4 days, then increase to one 1 mg tablet twice daily. (Patient not taking: Reported on 04/09/2015) 53 tablet 0   No current facility-administered medications for this visit.     Physical Exam  Blood pressure 167/103, pulse 105, temperature 98.1 F (36.7 C), temperature source Tympanic, resp. rate 14, height 5\' 5"  (1.651 m), weight 165 lb 6.4 oz (75.025 kg).  Constitutional: overall normal hygiene, normal nutrition, well developed, normal grooming, normal body habitus. Assistive device:none, walker  Musculoskeletal: gait and station Limp right, muscle tone and strength are normal, no tremors or atrophy is present.  .  Neurological: coordination overall normal.  Deep tendon reflex/nerve stretch intact.  Sensation normal.  Cranial nerves II-XII intact.   Skin:   normal overall no scars, lesions, ulcers or rashes. No psoriasis.  Psychiatric: Alert and oriented x 3.  Recent memory intact, remote memory unclear.  Normal mood and affect. Well groomed.  Good eye contact.  Cardiovascular: overall no swelling, no varicosities, no edema bilaterally, normal temperatures of the legs and arms, no clubbing, cyanosis and good capillary refill.  Lymphatic: palpation is normal.   Extremities:the right ankle is tender laterally over the talofibular ligament. She has no swelling, no ecchymosis.  NV is intact. Inspection as above Strength and tone normal Range of motion decreased of the right ankle  Additional services performed: x-rays were done of the  right ankle, reported separately  The patient has been educated about the nature of the problem(s) and counseled on treatment options.  The patient appeared to understand what I have discussed and is in agreement with it.  Encounter Diagnoses  Name Primary?  . Right ankle pain Yes  . 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  Precautions discussed.  Continue current medications.   Return to clinic 1 month

## 2015-04-13 ENCOUNTER — Encounter (HOSPITAL_COMMUNITY): Payer: Self-pay | Admitting: *Deleted

## 2015-04-13 ENCOUNTER — Emergency Department (HOSPITAL_COMMUNITY): Payer: Self-pay

## 2015-04-13 ENCOUNTER — Emergency Department (HOSPITAL_COMMUNITY)
Admission: EM | Admit: 2015-04-13 | Discharge: 2015-04-13 | Disposition: A | Discharge status: Home / Self Care | Payer: Self-pay | Attending: Emergency Medicine | Admitting: Emergency Medicine

## 2015-04-13 DIAGNOSIS — G8929 Other chronic pain: Secondary | ICD-10-CM

## 2015-04-13 DIAGNOSIS — F329 Major depressive disorder, single episode, unspecified: Secondary | ICD-10-CM | POA: Insufficient documentation

## 2015-04-13 DIAGNOSIS — F17210 Nicotine dependence, cigarettes, uncomplicated: Secondary | ICD-10-CM | POA: Insufficient documentation

## 2015-04-13 DIAGNOSIS — M79671 Pain in right foot: Secondary | ICD-10-CM | POA: Insufficient documentation

## 2015-04-13 DIAGNOSIS — J449 Chronic obstructive pulmonary disease, unspecified: Secondary | ICD-10-CM | POA: Insufficient documentation

## 2015-04-13 DIAGNOSIS — Z79899 Other long term (current) drug therapy: Secondary | ICD-10-CM | POA: Insufficient documentation

## 2015-04-13 DIAGNOSIS — I10 Essential (primary) hypertension: Secondary | ICD-10-CM | POA: Insufficient documentation

## 2015-04-13 DIAGNOSIS — M546 Pain in thoracic spine: Secondary | ICD-10-CM

## 2015-04-13 DIAGNOSIS — R0602 Shortness of breath: Secondary | ICD-10-CM | POA: Insufficient documentation

## 2015-04-13 LAB — CBC WITH DIFFERENTIAL/PLATELET
BASOS ABS: 0 10*3/uL (ref 0.0–0.1)
BASOS PCT: 0 %
Eosinophils Absolute: 0.1 10*3/uL (ref 0.0–0.7)
Eosinophils Relative: 1 %
HEMATOCRIT: 31 % — AB (ref 36.0–46.0)
HEMOGLOBIN: 9.6 g/dL — AB (ref 12.0–15.0)
Lymphocytes Relative: 30 %
Lymphs Abs: 3.1 10*3/uL (ref 0.7–4.0)
MCH: 26.2 pg (ref 26.0–34.0)
MCHC: 31 g/dL (ref 30.0–36.0)
MCV: 84.5 fL (ref 78.0–100.0)
MONOS PCT: 3 %
Monocytes Absolute: 0.3 10*3/uL (ref 0.1–1.0)
NEUTROS ABS: 6.7 10*3/uL (ref 1.7–7.7)
NEUTROS PCT: 66 %
Platelets: 515 10*3/uL — ABNORMAL HIGH (ref 150–400)
RBC: 3.67 MIL/uL — AB (ref 3.87–5.11)
RDW: 20 % — ABNORMAL HIGH (ref 11.5–15.5)
WBC: 10.2 10*3/uL (ref 4.0–10.5)

## 2015-04-13 LAB — URINALYSIS, ROUTINE W REFLEX MICROSCOPIC
Bilirubin Urine: NEGATIVE
GLUCOSE, UA: NEGATIVE mg/dL
HGB URINE DIPSTICK: NEGATIVE
KETONES UR: NEGATIVE mg/dL
Nitrite: NEGATIVE
PROTEIN: NEGATIVE mg/dL
Specific Gravity, Urine: 1.015 (ref 1.005–1.030)
pH: 6 (ref 5.0–8.0)

## 2015-04-13 LAB — COMPREHENSIVE METABOLIC PANEL
ALBUMIN: 4.2 g/dL (ref 3.5–5.0)
ALT: 27 U/L (ref 14–54)
ANION GAP: 11 (ref 5–15)
AST: 17 U/L (ref 15–41)
Alkaline Phosphatase: 102 U/L (ref 38–126)
BILIRUBIN TOTAL: 0.3 mg/dL (ref 0.3–1.2)
BUN: 10 mg/dL (ref 6–20)
CALCIUM: 9.1 mg/dL (ref 8.9–10.3)
CO2: 27 mmol/L (ref 22–32)
Chloride: 103 mmol/L (ref 101–111)
Creatinine, Ser: 0.46 mg/dL (ref 0.44–1.00)
GFR calc Af Amer: >60 mL/min (ref >60)
Glucose, Bld: 159 mg/dL — ABNORMAL HIGH (ref 65–99)
Potassium: 3 mmol/L — ABNORMAL LOW (ref 3.5–5.1)
Sodium: 141 mmol/L (ref 135–145)
Total Protein: 7.5 g/dL (ref 6.5–8.1)

## 2015-04-13 LAB — TROPONIN I

## 2015-04-13 LAB — BRAIN NATRIURETIC PEPTIDE: B NATRIURETIC PEPTIDE 5: 59 pg/mL (ref 0.0–100.0)

## 2015-04-13 LAB — URINE MICROSCOPIC-ADD ON

## 2015-04-13 LAB — PREGNANCY, URINE: Preg Test, Ur: NEGATIVE

## 2015-04-13 LAB — PROTIME-INR
INR: 0.99 (ref 0.00–1.49)
Prothrombin Time: 13.3 seconds (ref 11.6–15.2)

## 2015-04-13 MED ORDER — KETOROLAC TROMETHAMINE 30 MG/ML IJ SOLN
30.0000 mg | Freq: Once | INTRAMUSCULAR | Status: AC
  Administered 2015-04-13: 30 mg via INTRAVENOUS
  Filled 2015-04-13: qty 1

## 2015-04-13 MED ORDER — IOHEXOL 350 MG/ML SOLN
100.0000 mL | Freq: Once | INTRAVENOUS | Status: AC | PRN
  Administered 2015-04-13: 100 mL via INTRAVENOUS

## 2015-04-13 MED ORDER — SODIUM CHLORIDE 0.9 % IV SOLN
INTRAVENOUS | Status: DC
  Administered 2015-04-13: 19:00:00 via INTRAVENOUS

## 2015-04-13 MED ORDER — HYDROCODONE-ACETAMINOPHEN 5-325 MG PO TABS
2.0000 | ORAL_TABLET | Freq: Once | ORAL | Status: AC
  Administered 2015-04-13: 2 via ORAL
  Filled 2015-04-13: qty 2

## 2015-04-13 MED ORDER — POTASSIUM CHLORIDE CRYS ER 20 MEQ PO TBCR
40.0000 meq | EXTENDED_RELEASE_TABLET | Freq: Once | ORAL | Status: AC
  Administered 2015-04-13: 40 meq via ORAL
  Filled 2015-04-13: qty 2

## 2015-04-13 NOTE — ED Provider Notes (Signed)
CSN: [id number]     Arrival date & time 04/13/15  1616 History   First MD Initiated Contact with Patient 04/13/15 1812     Chief Complaint  Patient presents with  . Leg Swelling      HPI Pt was seen at 1825. Per pt, c/o gradual onset and persistence of constant multiple symptoms for the past 3 days. Symptoms include: "swelling all over," "bruising all over," mid-back "pain," and SOB. Pt was evaluated by her Ortho MD 4 days ago for chronic right ankle pain/swelling with reassuring XR. Denies abd pain, no N/V/D, no fevers, no CP/palpitations, no cough.    Past Medical History  Diagnosis Date  . Rheumatoid arthritis(714.0)   . Chronic anemia   . Hypertension   . Arthritis, rheumatoid (HCC)   . Incidental lung nodule   . Depression   . Chronic pain   . COPD (chronic obstructive pulmonary disease) [facility])    Past Surgical History  Procedure Laterality Date  . Tubal ligation    . Endometrial ablation    . Breast cyst excision  12/31/2010    Procedure: CYST EXCISION BREAST;  Surgeon: Fabio Bering, MD;  Location: [facility];  Service: General;  Laterality: Right;  Excision Sebaceous Cyst Right Breast   Family History  Problem Relation Age of Onset  . Anesthesia problems Neg Hx   . Cancer Father    Social History  Substance Use Topics  . Smoking status: Current Every Day Smoker -- 1.00 packs/day for 30 years    Types: Cigarettes  . Smokeless tobacco: Never Used     Comment: has cut back  . Alcohol Use: No   OB History    Gravida Para Term Preterm AB TAB SAB Ectopic Multiple Living   6 5 5  1  1   5      Review of Systems ROS: Statement: All systems negative except as marked or noted in the HPI; Constitutional: Negative for fever and chills. ; ; Eyes: Negative for eye pain, redness and discharge. ; ; ENMT: Negative for ear pain, hoarseness, nasal congestion, sinus pressure and sore throat. ; ; Cardiovascular: Negative for chest pain, palpitations, diaphoresis and +peripheral edema.  ; ; Respiratory: +SOB. Negative for cough, wheezing and stridor. ; ; Gastrointestinal: Negative for nausea, vomiting, diarrhea, abdominal pain, blood in stool, hematemesis, jaundice and rectal bleeding. . ; ; Genitourinary: Negative for dysuria, flank pain and hematuria. ; ; Musculoskeletal: +back pain. Negative for neck pain. Negative for swelling and trauma.; ; Skin: +bruising. Negative for pruritus, rash, abrasions, blisters, and skin lesion.; ; Neuro: Negative for headache, lightheadedness and neck stiffness. Negative for weakness, altered level of consciousness , altered mental status, extremity weakness, paresthesias, involuntary movement, seizure and syncope.      Allergies  Voltaren and Codeine  Home Medications   Prior to Admission medications   Medication Sig Start Date End Date Taking? Authorizing Provider  alendronate (FOSAMAX) 70 MG tablet Take 70 mg by mouth once a week. Take with a full glass of water on an empty stomach.(Monday)   Yes Historical Provider, MD  folic acid (FOLVITE) 1 MG tablet Take 1 mg by mouth daily.   Yes Historical Provider, MD  HYDROcodone-acetaminophen (NORCO) 7.5-325 MG tablet Take 1 tablet by mouth every 4 (four) hours as needed for moderate pain (Must last 30 days.  Do not drive or operate machinery while taking this medicine.). 04/09/15  Yes 04/11/15, MD  methotrexate (RHEUMATREX) 2.5 MG tablet Take 15 mg  by mouth once a week. Caution:Chemotherapy. Protect from light.(Thursday)   Yes Historical Provider, MD  Multiple Vitamin (MULTIVITAMIN WITH MINERALS) TABS Take 1 tablet by mouth daily.   Yes Historical Provider, MD  omeprazole (PRILOSEC) 40 MG capsule Take 1 capsule (40 mg total) by mouth daily. 12/27/14  Yes Jacquelin Hawking, PA-C  predniSONE (DELTASONE) 20 MG tablet Take 20 mg by mouth once as needed.   Yes Historical Provider, MD  Vitamin D, Ergocalciferol, (DRISDOL) 50000 units CAPS capsule Take 50,000 Units by mouth once a week. Takes on Mondays.  02/14/15  Yes Historical Provider, MD  varenicline (CHANTIX CONTINUING MONTH PAK) 1 MG tablet Take 1 tablet (1 mg total) by mouth 2 (two) times daily. Patient not taking: Reported on 04/09/2015 03/11/15   Jacquelin Hawking, PA-C  varenicline (CHANTIX PAK) 0.5 MG X 11 & 1 MG X 42 tablet Take one 0.5 mg tab by mouth qd for 3 days, then increase to one 0.5 mg tablet bid for 4 days, then increase to one 1 mg tablet twice daily. Patient not taking: Reported on 04/09/2015 03/11/15   Jacquelin Hawking, PA-C   BP 169/105 mmHg  Pulse 107  Temp(Src) 98.3 F (36.8 C) (Oral)  Resp 15  Ht 5\' 6"  (1.676 m)  Wt 173 lb (78.472 kg)  BMI 27.94 kg/m2  SpO2 99%   Patient Vitals for the past 24 hrs:  BP Temp Temp src Pulse Resp SpO2 Height Weight  04/13/15 2021 132/60 mmHg 98 F (36.7 C) Oral 72 16 100 % - -  04/13/15 1620 (!) 169/105 mmHg 98.3 F (36.8 C) Oral 107 15 99 % 5\' 6"  (1.676 m) 173 lb (78.472 kg)     Physical Exam  1830: Physical examination:  Nursing notes reviewed; Vital signs and O2 SAT reviewed;  Constitutional: Well developed, Well nourished, Well hydrated, In no acute distress; Head:  Normocephalic, atraumatic; Eyes: EOMI, PERRL, No scleral icterus; ENMT: Mouth and pharynx normal, Mucous membranes moist; Neck: Supple, Full range of motion, No lymphadenopathy; Cardiovascular: Regular rate and rhythm, No murmur, rub, or gallop; Respiratory: Breath sounds clear & equal bilaterally, No rales, rhonchi, wheezes.  Speaking full sentences with ease, Normal respiratory effort/excursion; Chest: Nontender, Movement normal. No rash, no ecchymosis.; Abdomen: Soft, Nontender, Nondistended, Normal bowel sounds. No rash, no ecchymosis.; Genitourinary: No CVA tenderness; Spine:  No midline CS, TS, LS tenderness. +TTP bilat lower thoracic paraspinal muscles. No rash. No ecchymosis.;; Extremities: Pulses normal, No tenderness, +localized right ankle edema, otherwise no edema bilat UE's or LE's. No calf tenderness, edema or  asymmetry.; Neuro: AA&Ox3, Major CN grossly intact.  Speech clear. No gross focal motor or sensory deficits in extremities.; Skin: Color normal, Warm, Dry. +few small scattered ecchymosis on LE's.    ED Course  Procedures (including critical care time) Labs Review  Imaging Review  I have personally reviewed and evaluated these images and lab results as part of my medical decision-making.   EKG Interpretation   Date/Time:  Saturday April 13 2015 16:25:57 EDT Ventricular Rate:  74 PR Interval:  148 QRS Duration: 84 QT Interval:  352 QTC Calculation: 390 R Axis:   52 Text Interpretation:  Normal sinus rhythm Possible Left atrial enlargement  Baseline wander When compared with ECG of 12/23/2014 No significant change  was found Confirmed by [facility]  MD, Nicholos Johns [phone]) on 04/13/2015 6:35:15  PM      MDM  MDM Reviewed: previous chart, nursing note and vitals Reviewed previous: labs and ECG Interpretation: labs, ECG and  CT scan      Results for orders placed or performed during the hospital encounter of 04/13/15  Comprehensive metabolic panel  Result Value Ref Range   Sodium 141 135 - 145 mmol/L   Potassium 3.0 (L) 3.5 - 5.1 mmol/L   Chloride 103 101 - 111 mmol/L   CO2 27 22 - 32 mmol/L   Glucose, Bld 159 (H) 65 - 99 mg/dL   BUN 10 6 - 20 mg/dL   Creatinine, Ser 8.10 0.44 - 1.00 mg/dL   Calcium 9.1 8.9 - 17.5 mg/dL   Total Protein 7.5 6.5 - 8.1 g/dL   Albumin 4.2 3.5 - 5.0 g/dL   AST 17 15 - 41 U/L   ALT 27 14 - 54 U/L   Alkaline Phosphatase 102 38 - 126 U/L   Total Bilirubin 0.3 0.3 - 1.2 mg/dL   GFR calc non Af Amer >60 >60 mL/min   GFR calc Af Amer >60 >60 mL/min   Anion gap 11 5 - 15  CBC with Differential  Result Value Ref Range   WBC 10.2 4.0 - 10.5 K/uL   RBC 3.67 (L) 3.87 - 5.11 MIL/uL   Hemoglobin 9.6 (L) 12.0 - 15.0 g/dL   HCT 10.2 (L) 58.5 - 27.7 %   MCV 84.5 78.0 - 100.0 fL   MCH 26.2 26.0 - 34.0 pg   MCHC 31.0 30.0 - 36.0 g/dL   RDW 82.4 (H) 23.5  - 15.5 %   Platelets 515 (H) 150 - 400 K/uL   Neutrophils Relative % 66 %   Neutro Abs 6.7 1.7 - 7.7 K/uL   Lymphocytes Relative 30 %   Lymphs Abs 3.1 0.7 - 4.0 K/uL   Monocytes Relative 3 %   Monocytes Absolute 0.3 0.1 - 1.0 K/uL   Eosinophils Relative 1 %   Eosinophils Absolute 0.1 0.0 - 0.7 K/uL   Basophils Relative 0 %   Basophils Absolute 0.0 0.0 - 0.1 K/uL  Brain natriuretic peptide  Result Value Ref Range   B Natriuretic Peptide 59.0 0.0 - 100.0 pg/mL  Troponin I  Result Value Ref Range   Troponin I <0.03 <0.031 ng/mL  Urinalysis, Routine w reflex microscopic  Result Value Ref Range   Color, Urine YELLOW YELLOW   APPearance HAZY (A) CLEAR   Specific Gravity, Urine 1.015 1.005 - 1.030   pH 6.0 5.0 - 8.0   Glucose, UA NEGATIVE NEGATIVE mg/dL   Hgb urine dipstick NEGATIVE NEGATIVE   Bilirubin Urine NEGATIVE NEGATIVE   Ketones, ur NEGATIVE NEGATIVE mg/dL   Protein, ur NEGATIVE NEGATIVE mg/dL   Nitrite NEGATIVE NEGATIVE   Leukocytes, UA SMALL (A) NEGATIVE  Protime-INR  Result Value Ref Range   Prothrombin Time 13.3 11.6 - 15.2 seconds   INR 0.99 0.00 - 1.49  Pregnancy, urine  Result Value Ref Range   Preg Test, Ur NEGATIVE NEGATIVE  Urine microscopic-add on  Result Value Ref Range   Squamous Epithelial / LPF 6-30 (A) NONE SEEN   WBC, UA 0-5 0 - 5 WBC/hpf   RBC / HPF 0-5 0 - 5 RBC/hpf   Bacteria, UA RARE (A) NONE SEEN   Dg Ankle Complete Right 04/09/2015  Clinical:  Misstep and pain of the right ankle several days ago. X-rays of the right ankle were done, three views The mortise is normal.  No soft tissue swelling is noted.  No acute fracture is noted eight.  Bone quality is good.  04/09/2015   Negative x-rays of the  right ankle, no acute injury noted.  Ct Angio Chest Pe W/cm &/or Wo Cm 04/13/2015  CLINICAL DATA:  Acute onset of mid upper back pain. Bilateral leg swelling and shortness of breath. Initial encounter. EXAM: CT ANGIOGRAPHY CHEST WITH CONTRAST TECHNIQUE:  Multidetector CT imaging of the chest was performed using the standard protocol during bolus administration of intravenous contrast. Multiplanar CT image reconstructions and MIPs were obtained to evaluate the vascular anatomy. CONTRAST:  OMNIPAQUE IOHEXOL 350 MG/ML SOLN COMPARISON:  CTA of the chest performed 05/23/2014, and chest radiograph performed 12/23/2014 FINDINGS: There is no evidence of pulmonary embolus. Minimal bibasilar atelectasis is noted. The lungs are otherwise grossly clear. There is no evidence of significant focal consolidation, pleural effusion or pneumothorax. No masses are identified; no abnormal focal contrast enhancement is seen. Diffuse coronary artery calcifications are seen. The mediastinum is otherwise unremarkable. No mediastinal lymphadenopathy is seen. No pericardial effusion is identified. The great vessels are grossly unremarkable in appearance. No axillary lymphadenopathy is seen. The thyroid gland is unremarkable in appearance. The visualized portions of the liver and spleen are unremarkable. No acute osseous abnormalities are seen. Review of the MIP images confirms the above findings. IMPRESSION: 1. No evidence of pulmonary embolus. 2. Minimal bibasilar atelectasis noted.  Lungs otherwise clear. 3. Diffuse coronary artery calcifications seen. Electronically Signed   By: Roanna Raider M.D.   On: 04/13/2015 21:17    2120:  H/H per baseline. Potassium repleted PO. Workup reassuring. Pt given her usual dose of pain meds with improvement. Pt states she would like a "shot of toradol" and then is ready to go home now. Dx and testing d/w pt.  Questions answered.  Verb understanding, agreeable to d/c home with outpt f/u.    Samuel Jester, DO 04/17/15 1059

## 2015-04-13 NOTE — Discharge Instructions (Signed)
Take your usual prescriptions as previously directed.  Apply moist heat or ice to the area(s) of discomfort, for 15 minutes at a time, several times per day for the next few days.  Do not fall asleep on a heating or ice pack.  Call your regular medical doctor and your Orthopedic doctor on Monday to schedule a follow up appointment this week.  Return to the Emergency Department immediately if worsening.

## 2015-04-13 NOTE — ED Notes (Signed)
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 She reports SI with a plan to "scratch" herself

## 2015-04-13 NOTE — ED Notes (Addendum)
Behavioral Health rounding and nursing note at 2024 and 2025 is an error. This pt IS NOT suicidal. EDP made aware.

## 2015-04-13 NOTE — ED Notes (Addendum)
Pt comes in with leg swelling starting Wednesday. Today she woke up having some difficulty breathing and mid-back pain. Pt tearful upon triage.

## 2015-04-23 ENCOUNTER — Ambulatory Visit: Payer: Self-pay | Admitting: Orthopaedic Surgery

## 2015-05-02 ENCOUNTER — Encounter: Payer: Self-pay | Admitting: Physician Assistant

## 2015-05-02 ENCOUNTER — Ambulatory Visit: Payer: Self-pay | Admitting: Physician Assistant

## 2015-05-02 VITALS — BP 114/80 | HR 92 | Temp 98.1°F | Ht 65.0 in | Wt 164.0 lb

## 2015-05-02 DIAGNOSIS — K219 Gastro-esophageal reflux disease without esophagitis: Secondary | ICD-10-CM

## 2015-05-02 DIAGNOSIS — D649 Anemia, unspecified: Secondary | ICD-10-CM

## 2015-05-02 DIAGNOSIS — M069 Rheumatoid arthritis, unspecified: Secondary | ICD-10-CM

## 2015-05-02 DIAGNOSIS — E876 Hypokalemia: Secondary | ICD-10-CM

## 2015-05-02 DIAGNOSIS — F17210 Nicotine dependence, cigarettes, uncomplicated: Secondary | ICD-10-CM

## 2015-05-02 DIAGNOSIS — F419 Anxiety disorder, unspecified: Secondary | ICD-10-CM

## 2015-05-02 NOTE — Progress Notes (Signed)
BP 114/80 mmHg  Pulse 92  Temp(Src) 98.1 F (36.7 C)  Ht 5\' 5"  (1.651 m)  Wt 164 lb (74.39 kg)  BMI 27.29 kg/m2  SpO2 97%   Subjective:    Patient ID: , female    DOB: 10/19/1965, 50 y.o.   MRN: [id number]  HPI: [name] is a 50 y.o. female presenting on 05/02/2015 for Gastroesophageal Reflux   HPI   Pt hasn't started chantix but hasn't started it yet. She will be starting that soon.  She hasn't called [facility] yet but has card and hop[es to go to [facility] soon.   Relevant past medical, surgical, family and social history reviewed and updated as indicated. Interim medical history since our last visit reviewed. Allergies and medications reviewed and updated.   Current outpatient prescriptions:  .  alendronate (FOSAMAX) 70 MG tablet, Take 70 mg by mouth once a week. Take with a full glass of water on an empty stomach.(Monday), Disp: , Rfl:  .  folic acid (FOLVITE) 1 MG tablet, Take 1 mg by mouth daily., Disp: , Rfl:  .  HYDROcodone-acetaminophen (NORCO) 7.5-325 MG tablet, Take 1 tablet by mouth every 4 (four) hours as needed for moderate pain (Must last 30 days.  Do not drive or operate machinery while taking this medicine.)., Disp: 120 tablet, Rfl: 0 .  methotrexate (RHEUMATREX) 2.5 MG tablet, Take 15 mg by mouth once a week. Caution:Chemotherapy. Protect from light.(Thursday), Disp: , Rfl:  .  naproxen (NAPROSYN) 500 MG tablet, Take 500 mg by mouth 2 (two) times daily with a meal., Disp: , Rfl:  .  omeprazole (PRILOSEC) 40 MG capsule, Take 1 capsule (40 mg total) by mouth daily., Disp: 90 capsule, Rfl: 1 .  Vitamin D, Ergocalciferol, (DRISDOL) 50000 units CAPS capsule, Take 50,000 Units by mouth once a week. Takes on Mondays., Disp: , Rfl: 2 .  Multiple Vitamin (MULTIVITAMIN WITH MINERALS) TABS, Take 1 tablet by mouth daily. Reported on 05/02/2015, Disp: , Rfl:  .  varenicline (CHANTIX CONTINUING MONTH PAK) 1 MG tablet, Take 1 tablet (1 mg total) by mouth 2  (two) times daily. (Patient not taking: Reported on 04/09/2015), Disp: 180 tablet, Rfl: 1 .  varenicline (CHANTIX PAK) 0.5 MG X 11 & 1 MG X 42 tablet, Take one 0.5 mg tab by mouth qd for 3 days, then increase to one 0.5 mg tablet bid for 4 days, then increase to one 1 mg tablet twice daily. (Patient not taking: Reported on 04/09/2015), Disp: 53 tablet, Rfl: 0   Review of Systems  Constitutional: Positive for fatigue. Negative for fever, chills, diaphoresis, appetite change and unexpected weight change.  HENT: Positive for dental problem and facial swelling (improving off prednisone). Negative for congestion, drooling, ear pain, hearing loss, mouth sores, sneezing, sore throat, trouble swallowing and voice change.   Eyes: Negative for pain, discharge, redness, itching and visual disturbance.  Respiratory: Positive for shortness of breath and wheezing. Negative for cough and choking.   Cardiovascular: Positive for leg swelling. Negative for chest pain and palpitations.  Gastrointestinal: Negative for vomiting, abdominal pain, diarrhea, constipation and blood in stool.  Endocrine: Positive for polydipsia. Negative for cold intolerance and heat intolerance.  Genitourinary: Negative for dysuria, hematuria and decreased urine volume.  Musculoskeletal: Positive for back pain, arthralgias and gait problem.  Skin: Negative for rash.  Allergic/Immunologic: Negative for environmental allergies.  Neurological: Positive for headaches. Negative for seizures, syncope and light-headedness.  Hematological: Negative for adenopathy.  Psychiatric/Behavioral: Positive for dysphoric mood and agitation. Negative for suicidal ideas. The patient is nervous/anxious.     Per HPI unless specifically indicated above     Objective:    BP 114/80 mmHg  Pulse 92  Temp(Src) 98.1 F (36.7 C)  Ht 5\' 5"  (1.651 m)  Wt 164 lb (74.39 kg)  BMI 27.29 kg/m2  SpO2 97%  Wt Readings from Last 3 Encounters:  05/02/15 164 lb  (74.39 kg)  04/13/15 173 lb (78.472 kg)  04/09/15 165 lb 6.4 oz (75.025 kg)    Physical Exam  Constitutional: She is oriented to person, place, and time. She appears well-developed and well-nourished.  HENT:  Head: Normocephalic and atraumatic.  Neck: Neck supple.  Cardiovascular: Normal rate and regular rhythm.   Pulmonary/Chest: Effort normal and breath sounds normal.  Abdominal: Soft. Bowel sounds are normal. She exhibits no mass. There is no hepatosplenomegaly. There is no tenderness.  Musculoskeletal: She exhibits no edema.  Lymphadenopathy:    She has no cervical adenopathy.  Neurological: She is alert and oriented to person, place, and time.  Skin: Skin is warm and dry.  Psychiatric: She has a normal mood and affect. Her behavior is normal.  Vitals reviewed.       Assessment & Plan:   Encounter Diagnoses  Name Primary?  . Hypokalemia Yes  . Anemia, unspecified anemia type   . Cigarette nicotine dependence, uncomplicated   . Gastroesophageal reflux disease, esophagitis presence not specified   . Rheumatoid arthritis, involving unspecified site, unspecified rheumatoid factor presence (HCC)   . Anxiety disorder, unspecified anxiety disorder type     -Check h/h and K+ today when leaves office.  Will call with results -continue with rheumatologist for RA and ortho for fractures -F/u 3 months. RTO sooner prn

## 2015-05-05 DIAGNOSIS — F419 Anxiety disorder, unspecified: Secondary | ICD-10-CM | POA: Insufficient documentation

## 2015-05-05 DIAGNOSIS — E876 Hypokalemia: Secondary | ICD-10-CM | POA: Insufficient documentation

## 2015-05-05 DIAGNOSIS — D649 Anemia, unspecified: Secondary | ICD-10-CM | POA: Insufficient documentation

## 2015-05-08 ENCOUNTER — Encounter: Payer: Self-pay | Admitting: Orthopaedic Surgery

## 2015-05-08 ENCOUNTER — Ambulatory Visit (INDEPENDENT_AMBULATORY_CARE_PROVIDER_SITE_OTHER): Payer: Self-pay

## 2015-05-08 ENCOUNTER — Ambulatory Visit (INDEPENDENT_AMBULATORY_CARE_PROVIDER_SITE_OTHER): Payer: Self-pay | Admitting: Orthopaedic Surgery

## 2015-05-08 VITALS — BP 171/104 | HR 83 | Temp 98.1°F | Resp 16 | Ht 65.0 in | Wt 164.0 lb

## 2015-05-08 DIAGNOSIS — M05771 Rheumatoid arthritis with rheumatoid factor of right ankle and foot without organ or systems involvement: Secondary | ICD-10-CM

## 2015-05-08 DIAGNOSIS — M80859D Other osteoporosis with current pathological fracture, unspecified femur, subsequent encounter for fracture with routine healing: Secondary | ICD-10-CM

## 2015-05-08 DIAGNOSIS — S92252G Displaced fracture of navicular [scaphoid] of left foot, subsequent encounter for fracture with delayed healing: Secondary | ICD-10-CM

## 2015-05-08 DIAGNOSIS — S92212D Displaced fracture of cuboid bone of left foot, subsequent encounter for fracture with routine healing: Secondary | ICD-10-CM

## 2015-05-08 DIAGNOSIS — M810 Age-related osteoporosis without current pathological fracture: Secondary | ICD-10-CM

## 2015-05-08 DIAGNOSIS — M05772 Rheumatoid arthritis with rheumatoid factor of left ankle and foot without organ or systems involvement: Secondary | ICD-10-CM

## 2015-05-08 MED ORDER — HYDROCODONE-ACETAMINOPHEN 7.5-325 MG PO TABS: 1.0000 | ORAL_TABLET | ORAL | Status: DC | PRN

## 2015-05-08 NOTE — Progress Notes (Signed)
Patient [name], female DOB:09-Feb-1965, 50 y.o. OFB:[id number]  Chief Complaint  Patient presents with  . Follow-up    right ankle pain, pelvis fracture    HPI  [name] is a 50 y.o. female who is here for follow-up of pelvis fracture and left foot fracture.  Her pelvis pain is much improved and she has little pain in this area now.  She is sitting with little pain.  Her right foot and ankle has less pain.  She has had more pain in the left foot more proximal and mid foot.  She has no redness.  Her lateral left foot pain is much improved.  She has no redness.  She has no new trauma.  She is using her CAM walker and a walker.    She has talked to her rheumatologist and her methotrexate  Henas been increased.  She has seen her endocrine doctor about her osteoporosis and is on treatment for this.  HPI  Body mass index is 27.29 kg/(m^2).  Review of Systems  Constitutional: Positive for fatigue.       Patient does not have Diabetes Mellitus. Patient has hypertension. Patient has COPD or shortness of breath. Patient does not have BMI > 35. Patient has current smoking history.  HENT: Negative for congestion.   Respiratory: Negative for cough and shortness of breath.   Cardiovascular: Negative for chest pain.  Endocrine: Positive for cold intolerance.  Musculoskeletal: Positive for myalgias, back pain, joint swelling, arthralgias and gait problem.       She has rheumatoid arthritis.  Long term steroid use.  Neurological: Negative for numbness.    Past Medical History  Diagnosis Date  . Rheumatoid arthritis(714.0)   . Chronic anemia   . Hypertension   . Arthritis, rheumatoid (HCC)   . Incidental lung nodule   . Depression   . Chronic pain   . COPD (chronic obstructive pulmonary disease) [facility])     Past Surgical History  Procedure Laterality Date  . Tubal ligation    . Endometrial ablation    . Breast cyst excision  12/31/2010    Procedure: CYST EXCISION  BREAST;  Surgeon: Fabio Bering, MD;  Location: [facility];  Service: General;  Laterality: Right;  Excision Sebaceous Cyst Right Breast    Family History  Problem Relation Age of Onset  . Anesthesia problems Neg Hx   . Cancer Father     lung  . Hypertension Mother     Social History Social History  Substance Use Topics  . Smoking status: Current Every Day Smoker -- 1.00 packs/day for 30 years    Types: Cigarettes  . Smokeless tobacco: Never Used     Comment: has cut back, has not started Chantix  . Alcohol Use: No    Allergies  Allergen Reactions  . Voltaren [Diclofenac Sodium] Nausea And Vomiting  . Codeine Itching and Rash    Rash occurred when taking tylenol #3.    Current Outpatient Prescriptions  Medication Sig Dispense Refill  . alendronate (FOSAMAX) 70 MG tablet Take 70 mg by mouth once a week. Take with a full glass of water on an empty stomach.(Monday)    . folic acid (FOLVITE) 1 MG tablet Take 1 mg by mouth daily.    Marland Kitchen HYDROcodone-acetaminophen (NORCO) 7.5-325 MG tablet Take 1 tablet by mouth every 4 (four) hours as needed for moderate pain (Must last 30 days.  Do not drive or operate machinery while taking this medicine.). 120 tablet 0  .  methotrexate (RHEUMATREX) 2.5 MG tablet Take 15 mg by mouth once a week. Caution:Chemotherapy. Protect from light.(Thursday)    . Multiple Vitamin (MULTIVITAMIN WITH MINERALS) TABS Take 1 tablet by mouth daily. Reported on 05/02/2015    . naproxen (NAPROSYN) 500 MG tablet Take 500 mg by mouth 2 (two) times daily with a meal.    . omeprazole (PRILOSEC) 40 MG capsule Take 1 capsule (40 mg total) by mouth daily. 90 capsule 1  . varenicline (CHANTIX CONTINUING MONTH PAK) 1 MG tablet Take 1 tablet (1 mg total) by mouth 2 (two) times daily. 180 tablet 1  . varenicline (CHANTIX PAK) 0.5 MG X 11 & 1 MG X 42 tablet Take one 0.5 mg tab by mouth qd for 3 days, then increase to one 0.5 mg tablet bid for 4 days, then increase to one 1 mg tablet  twice daily. 53 tablet 0  . Vitamin D, Ergocalciferol, (DRISDOL) 50000 units CAPS capsule Take 50,000 Units by mouth once a week. Takes on Mondays.  2   No current facility-administered medications for this visit.     Physical Exam  Blood pressure 171/104, pulse 83, temperature 98.1 F (36.7 C), resp. rate 16, height 5\' 5"  (1.651 m), weight 164 lb (74.39 kg).  Constitutional: overall normal hygiene, normal nutrition, well developed, normal grooming, normal body habitus. Assistive device:walker and CAM walker  Musculoskeletal: gait and station Limp left, muscle tone and strength are normal, no tremors or atrophy is present.  .  Neurological: coordination overall normal.  Deep tendon reflex/nerve stretch intact.  Sensation normal.  Cranial nerves II-XII intact.   Skin:   normal overall no scars, lesions, ulcers or rashes. No psoriasis.  Psychiatric: Alert and oriented x 3.  Recent memory intact, remote memory unclear.  Normal mood and affect. Well groomed.  Good eye contact.  Cardiovascular: overall no swelling, no varicosities, no edema bilaterally, normal temperatures of the legs and arms, no clubbing, cyanosis and good capillary refill.  Lymphatic: palpation is normal.  Right Ankle Exam  Right ankle exam is normal.   Left Ankle Exam  Swelling: mild  Tenderness  The patient is experiencing tenderness in the ATF.   Range of Motion  Dorsiflexion:  10 abnormal  Plantar flexion:  10 abnormal  Inversion:  0 abnormal  Eversion:  5 abnormal   Muscle Strength  The patient has normal left ankle strength.  Tests  Anterior drawer: negative  Other  Erythema: absent Scars: absent Sensation: normal Pulse: present  Comments:  She has more pain over the navicular area of the dorsum of the left foot.  No redness is present.  Sensation is intact.    X-rays were done of the left foot and ankle and pelvis.  She has significant rheumatoid changes of the calcaneus navicular  joint.  I will get a MRI to better delineate the problem. I have explained to the patient the significance of the findings.  Reports of x-rays separately done.  The patient has been educated about the nature of the problem(s) and counseled on treatment options.  The patient appeared to understand what I have discussed and is in agreement with it.  Encounter Diagnoses  Name Primary?  . Pathological fracture of pelvis due to other osteoporosis with routine healing, subsequent encounter Yes  . Fracture, cuboid, left, with routine healing, subsequent encounter   . Closed tarsal navicular fracture, left, with delayed healing, subsequent encounter   . Rheumatoid arthritis involving both feet with positive rheumatoid factor (HCC)   .  Osteoporosis     PLAN Call if any problems.  Precautions discussed.  Continue current medications.   Return to clinic after MRI of the left foot.  Continue the CAM walker and walker.

## 2015-05-09 ENCOUNTER — Telehealth: Payer: Self-pay | Admitting: Student

## 2015-05-09 NOTE — Telephone Encounter (Signed)
Called and reminded pt that she is to go to the lab and does not need to be fasting to recheck her potassium. Pt understood and stated she would go today.

## 2015-05-09 NOTE — Telephone Encounter (Signed)
-----   Message from Jacquelin Hawking, [place] sent at 05/09/2015  8:08 AM EDT ----- Please call pt and remind her to get her blood drawn.  She was supposed to go last Thursday afternoon and has not. She does not need to be fasting.  It is very important because we need to recheck her potassium which was low

## 2015-05-14 IMAGING — CR DG HAND COMPLETE 3+V*L*
3 series · 3 of 3 positions shown · non-contrast
Comparison: None.

CLINICAL DATA: Fall with left hand injury and pain.

EXAM:
LEFT HAND - COMPLETE 3+ VIEW

[view not recorded (1 of 3)]
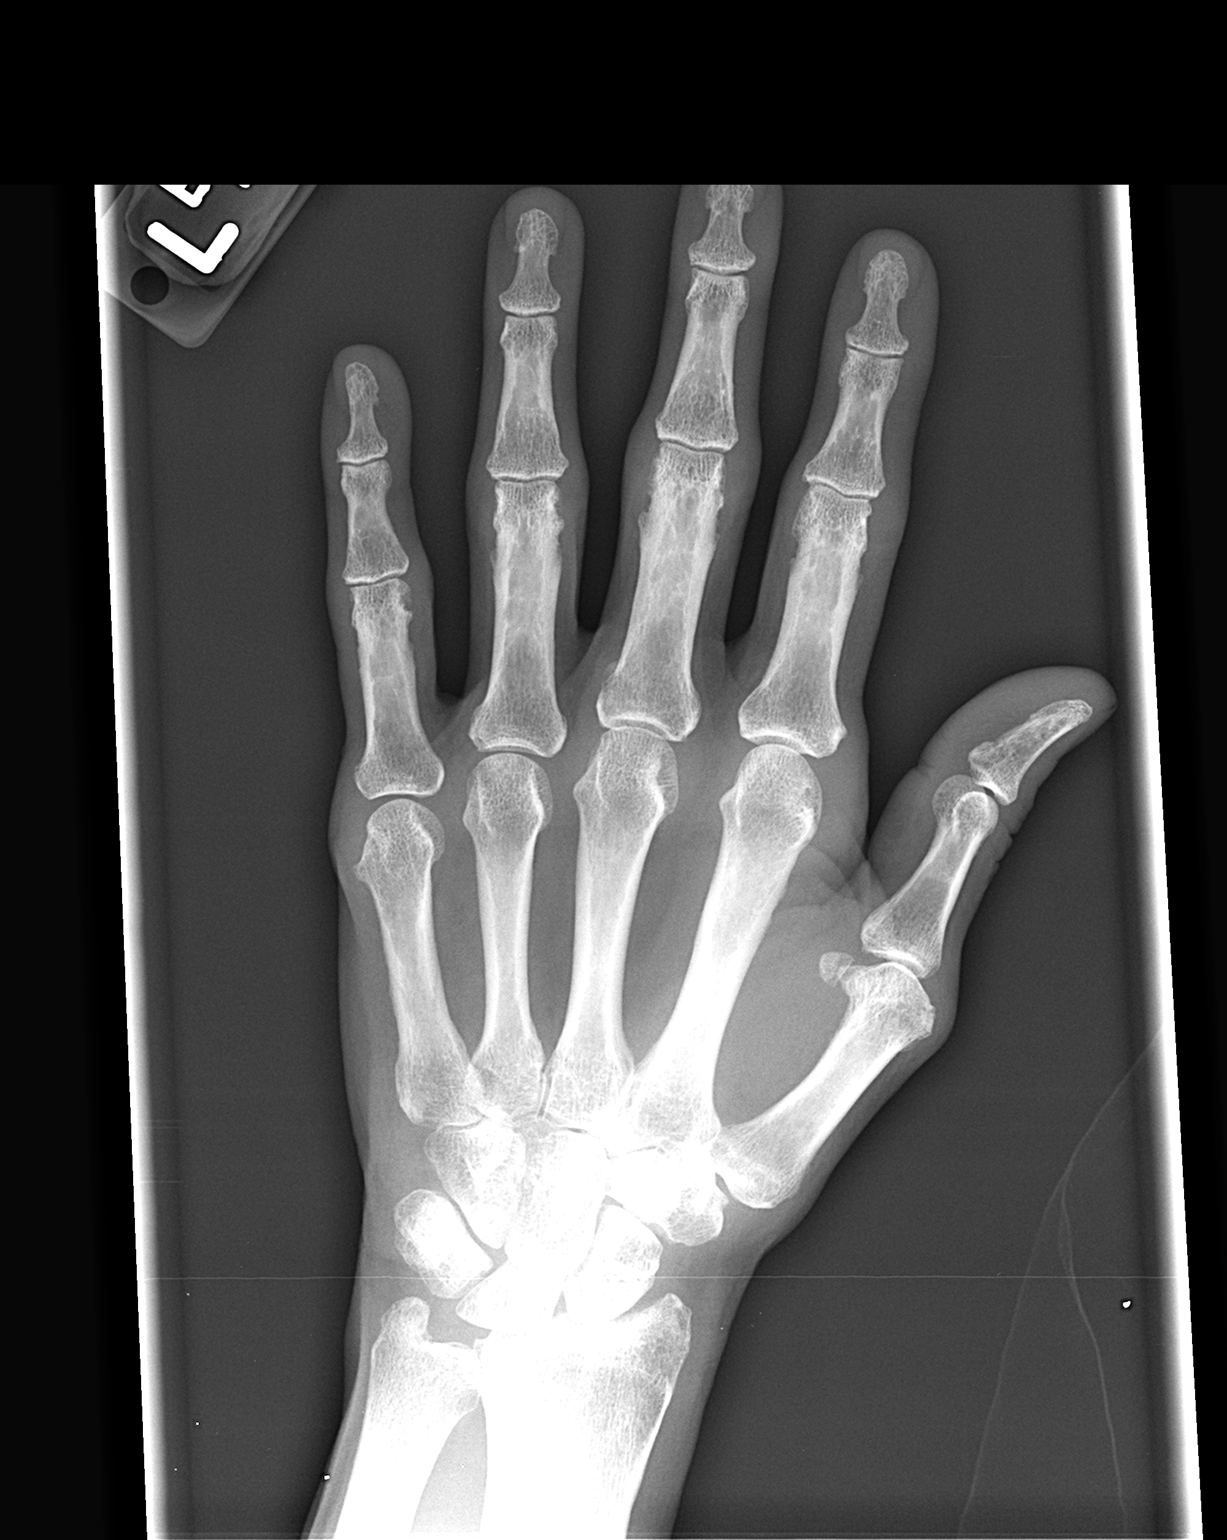

[view not recorded (2 of 3)]
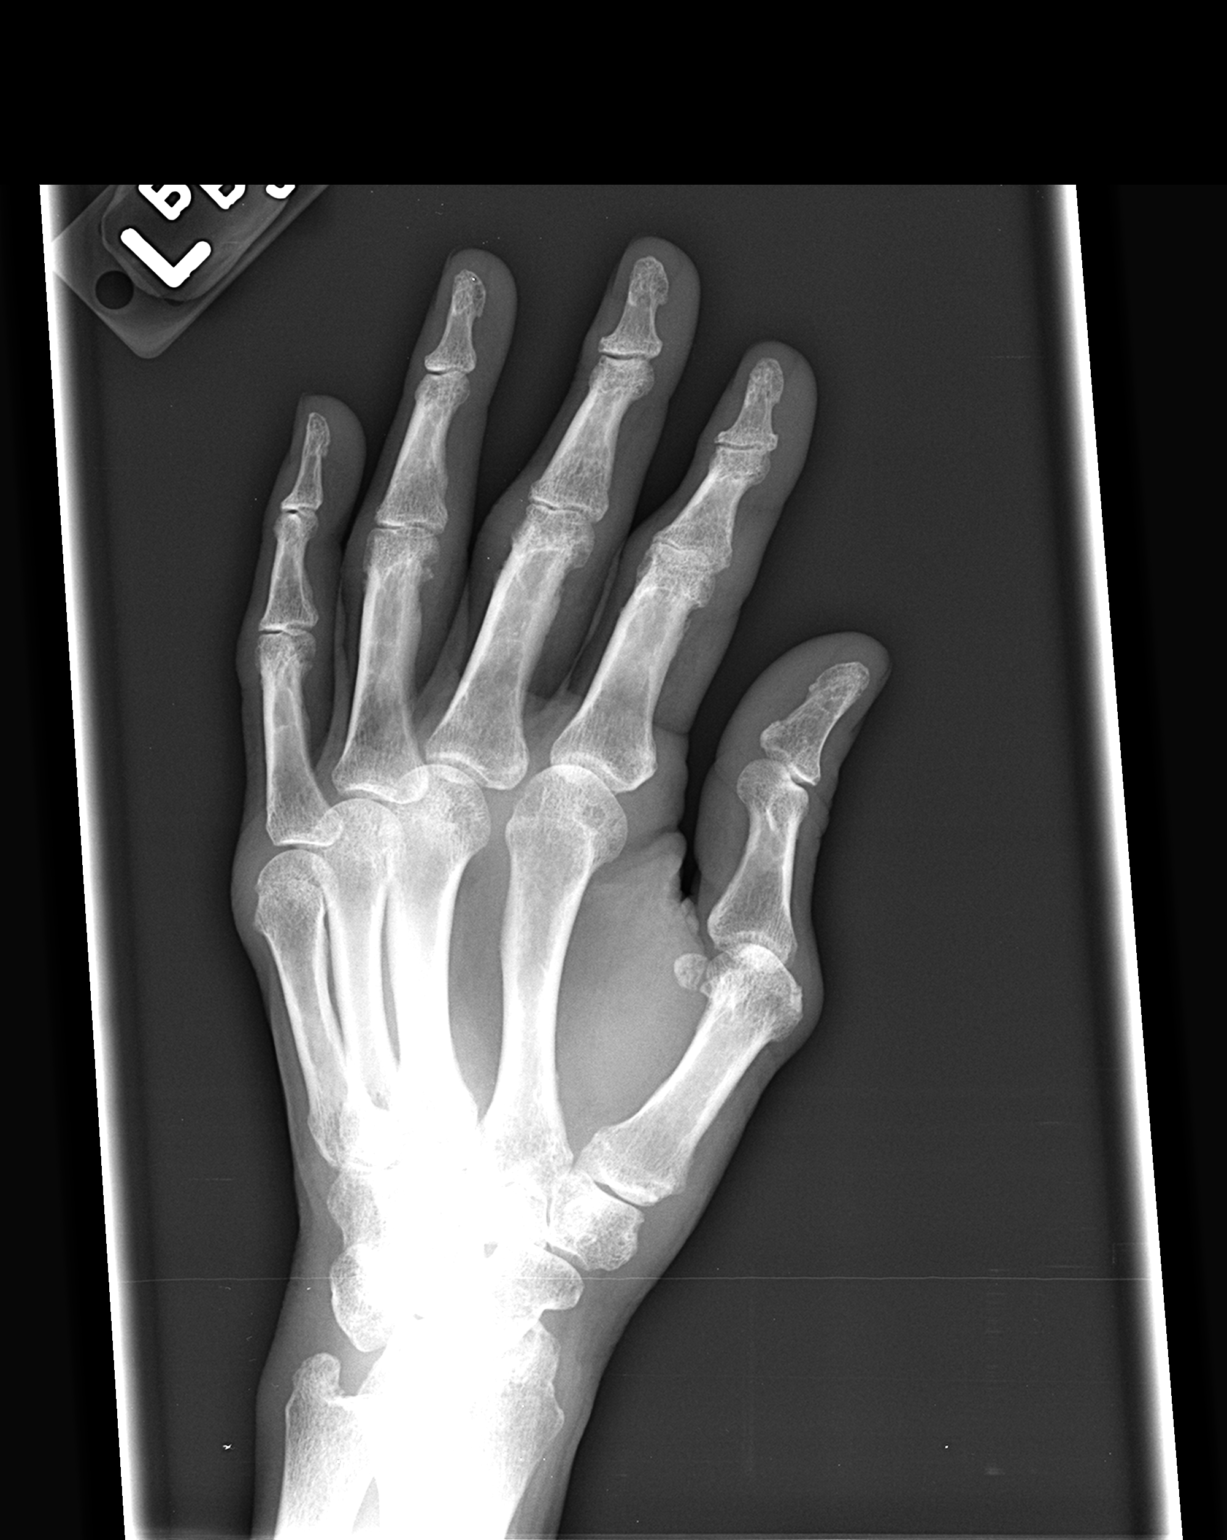

[view not recorded (3 of 3)]
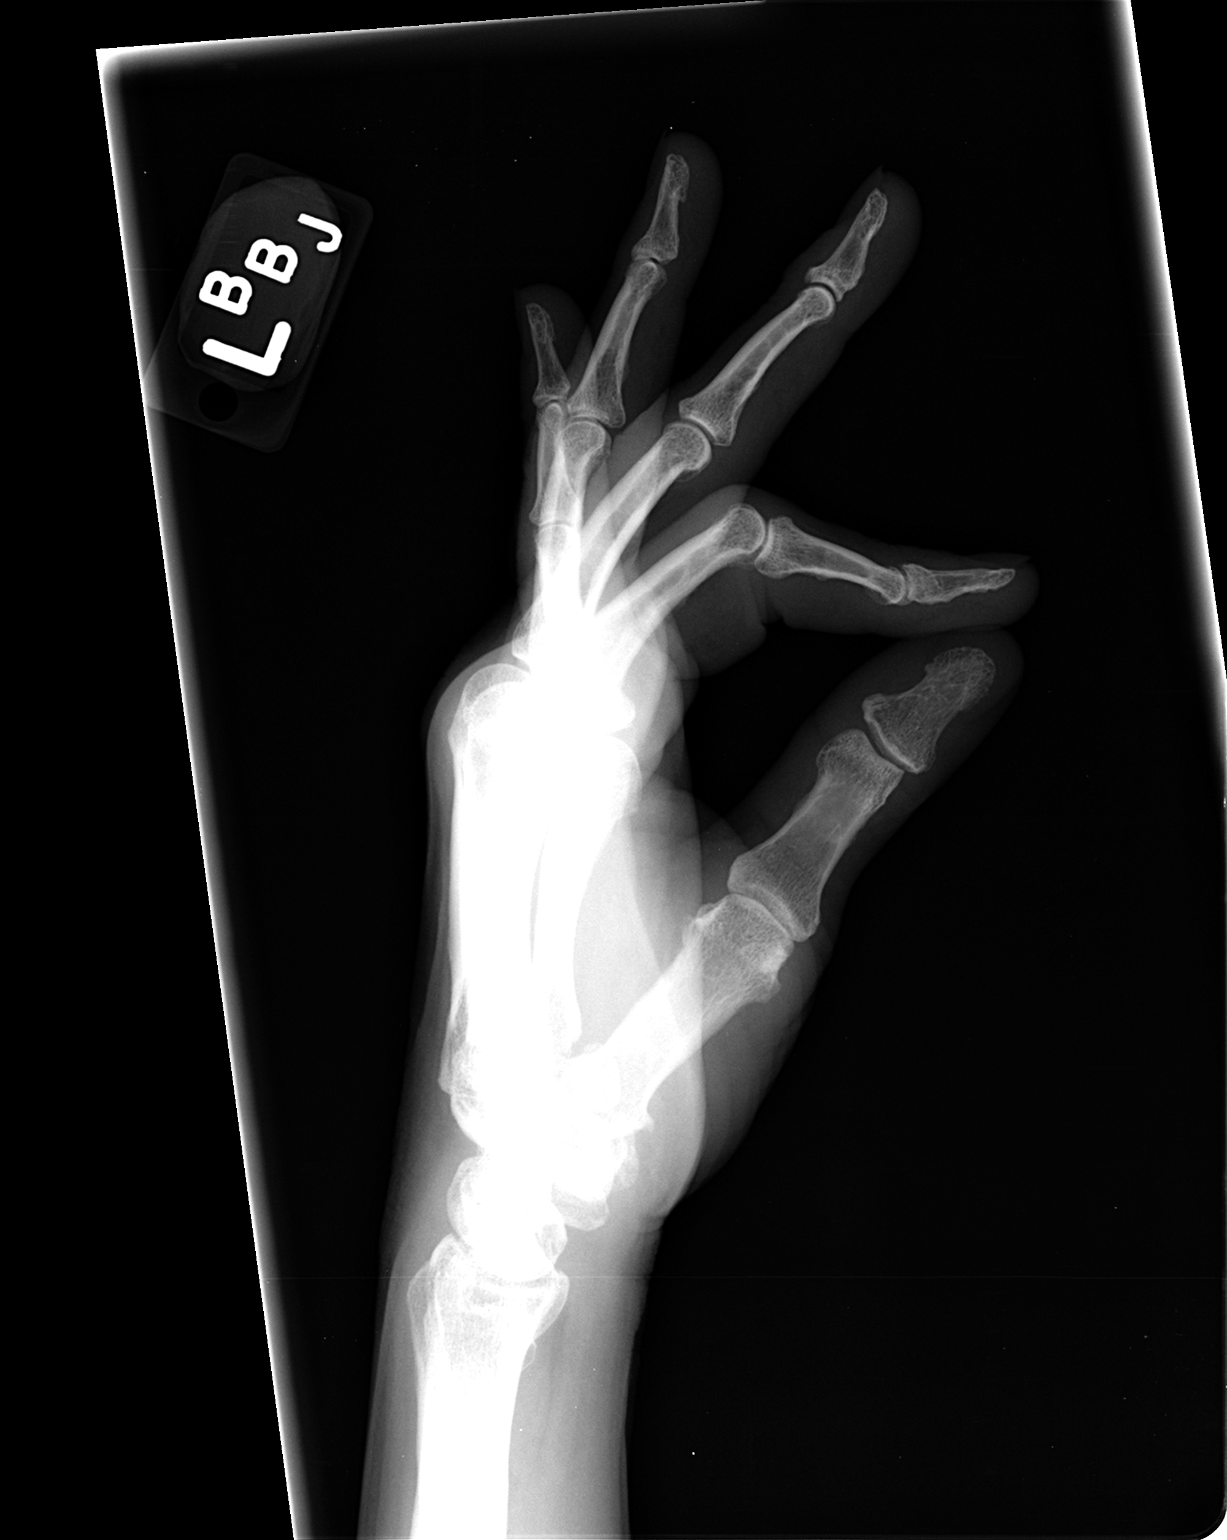

[3 of 3 positions shown; findings below may reference images not displayed]

FINDINGS: There is no evidence of acute fracture, subluxation or dislocation.

Probable erosive changes primarily along the PIP joints noted.

No other focal bony abnormalities are noted.
IMPRESSION: No evidence of acute bony abnormalities.

Probable erosive changes along the PIP joints likely representing
inflammatory arthritis such as rheumatoid.

## 2015-05-14 IMAGING — CR DG FOOT COMPLETE 3+V*L*
3 series · 3 of 3 positions shown · non-contrast
Comparison: None.

CLINICAL DATA: Patient fell off a ladder.  Pain in the left foot.

EXAM:
LEFT FOOT - COMPLETE 3+ VIEW

[view not recorded (1 of 3)]
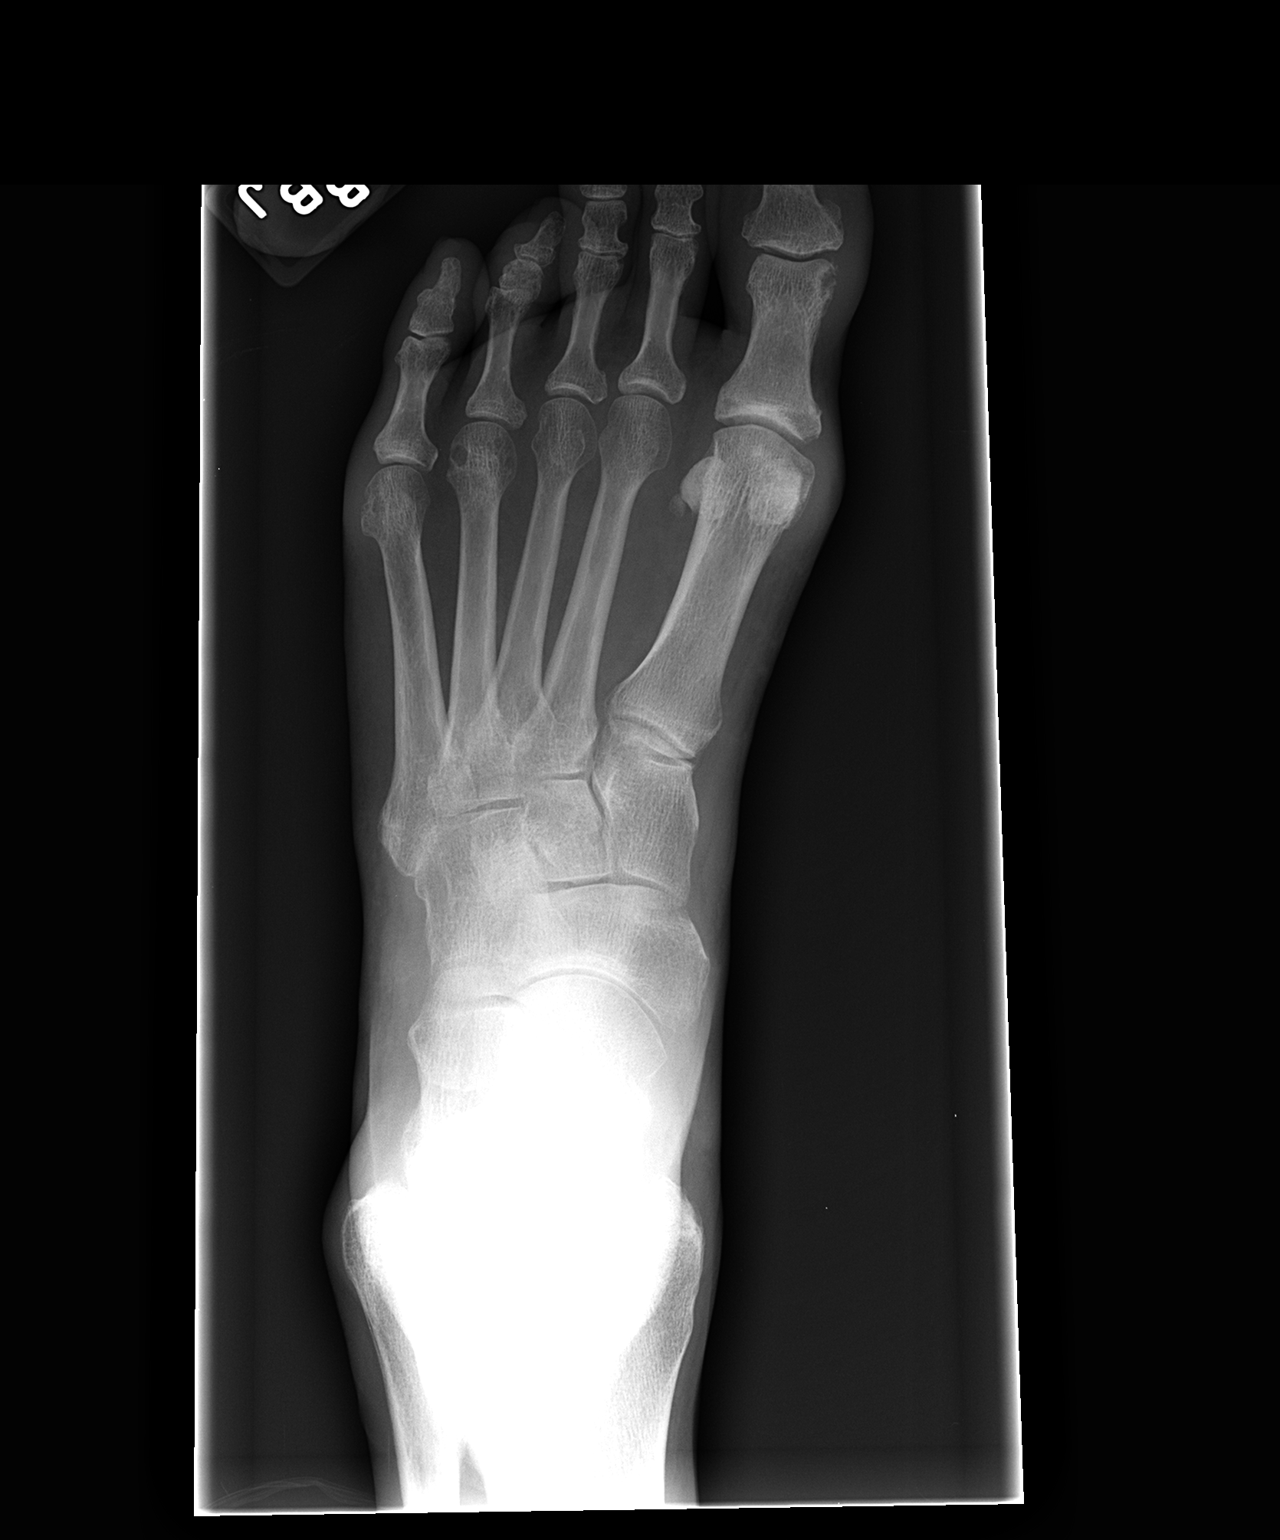

[view not recorded (2 of 3)]
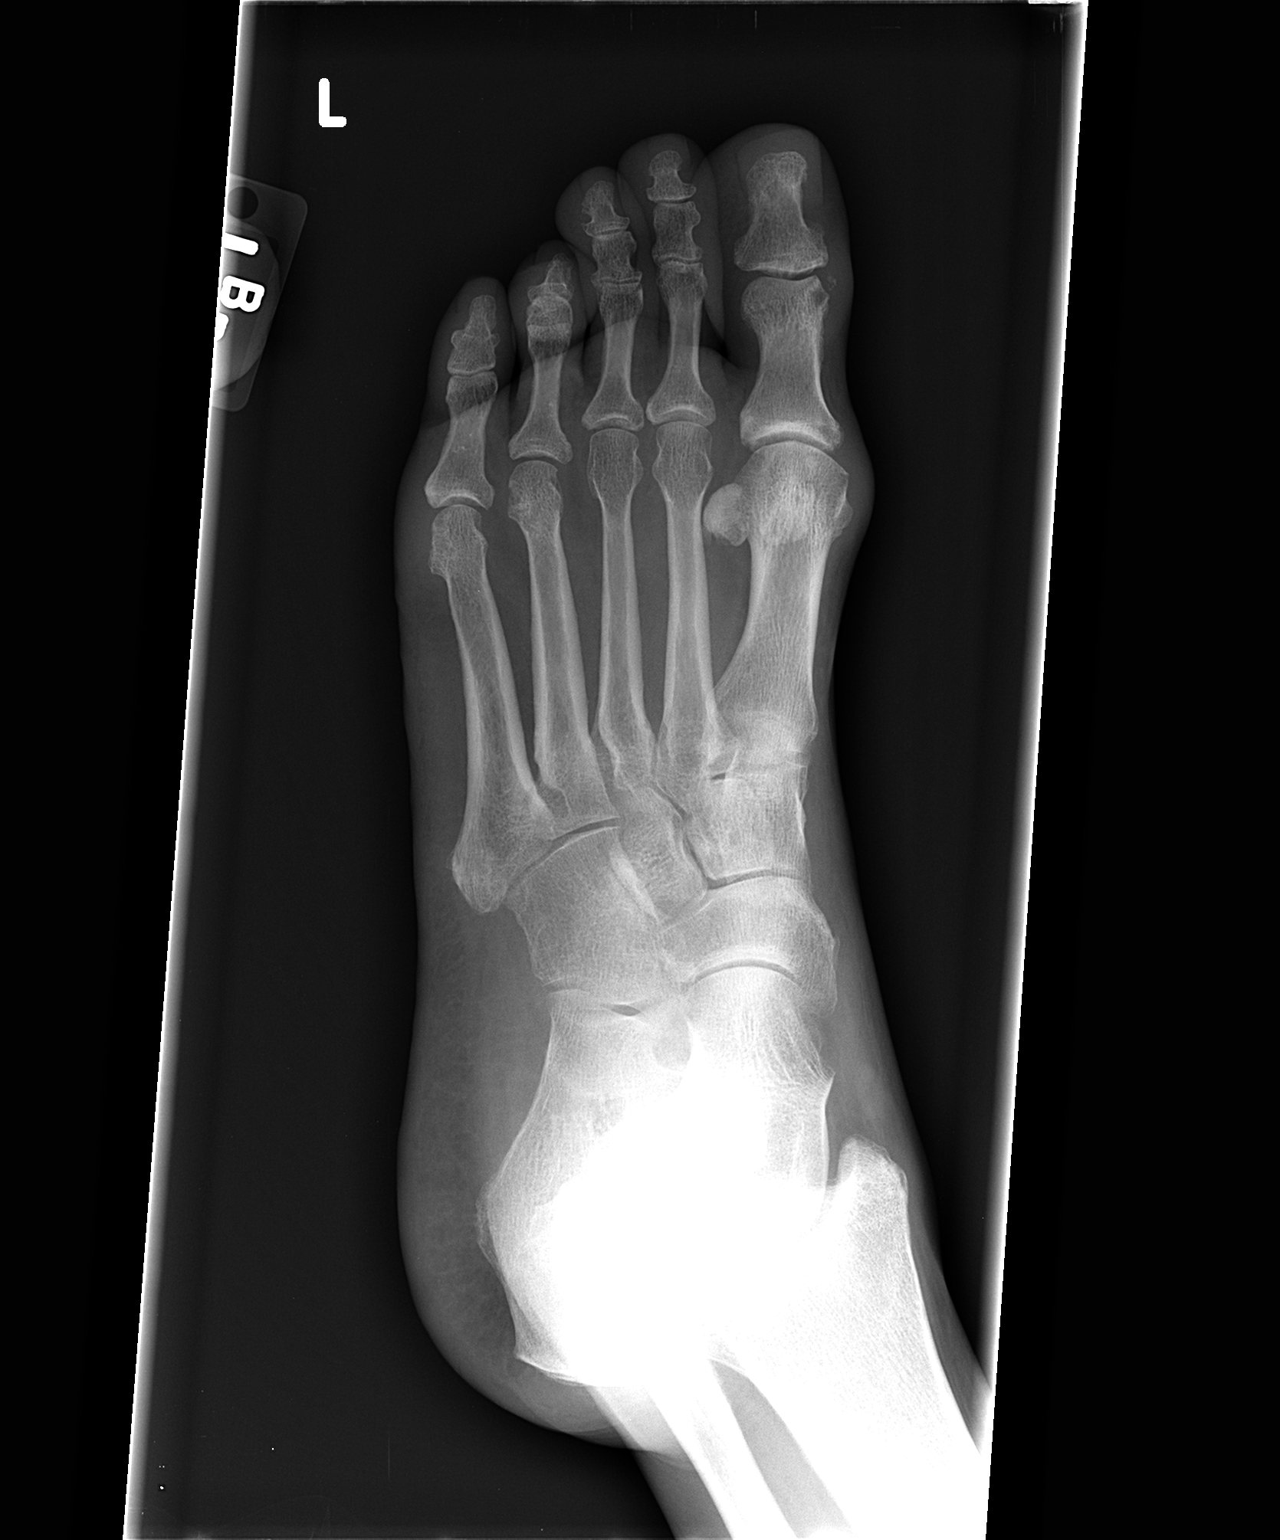

[view not recorded (3 of 3)]
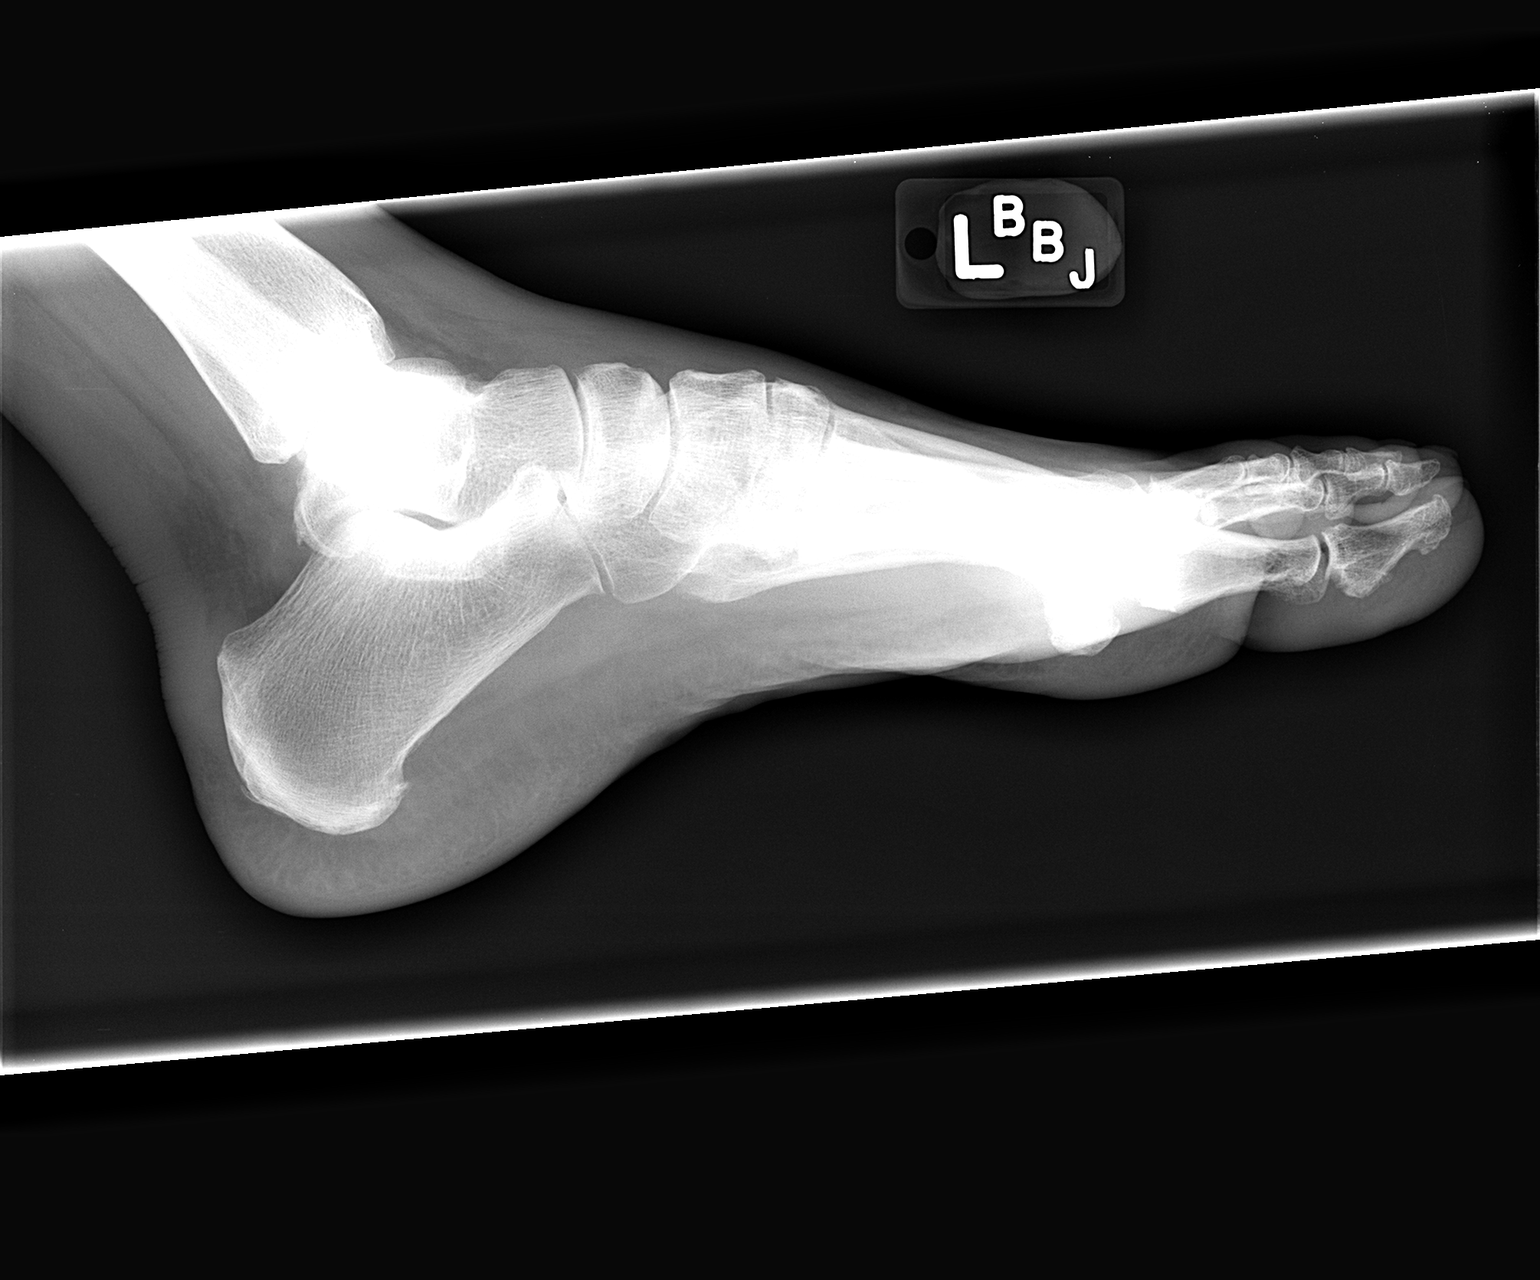

[3 of 3 positions shown; findings below may reference images not displayed]

FINDINGS: Degenerative cyst demonstrated in the fourth metatarsal head and in
the first proximal phalanx. No evidence of acute fracture or
dislocation. No focal bone lesion or bone destruction. Soft tissues
are unremarkable.
IMPRESSION: Degenerative changes.  No acute fractures.

## 2015-05-14 IMAGING — CT CT CERVICAL SPINE W/O CM
3 of 5 series · 10 of 33 positions shown, 12 images · non-contrast
Comparison: CT CHEST W/CM dated 05/01/2013; CT HEAD W/O CM dated
01/24/2012

CLINICAL DATA: Fall from ladder

EXAM:
CT HEAD WITHOUT CONTRAST
CT CERVICAL SPINE WITHOUT CONTRAST
TECHNIQUE: Multidetector CT imaging of the head and cervical spine was
performed following the standard protocol without intravenous
contrast. Multiplanar CT image reconstructions of the cervical spine
were also generated.

[Series 5: cervical st 2.0 b31s · axial · 0.27mm/px · z∈[+106,+172]mm · 2 of 84 slices shown, 3 images]
[im 34/84  soft-tissue]
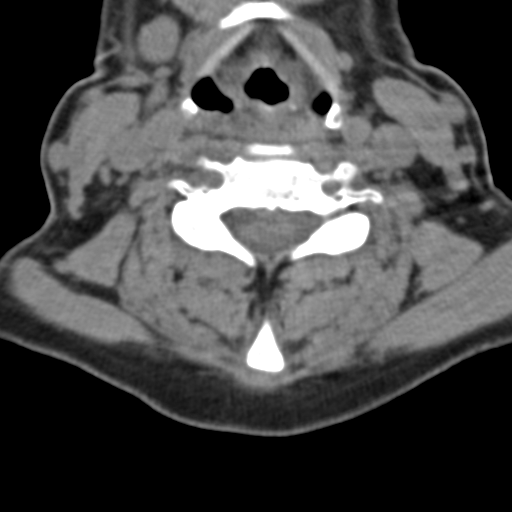
[im 34/84  bone]
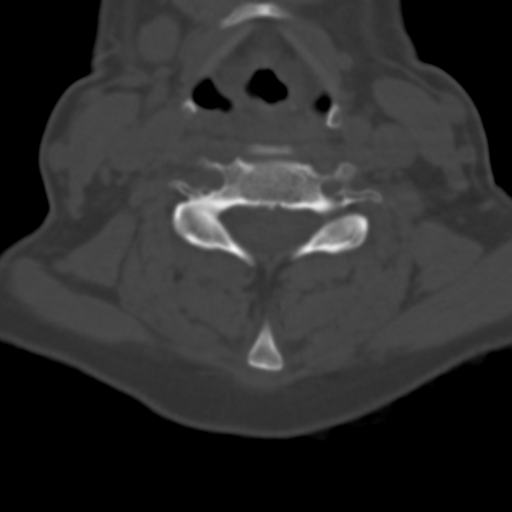
[im 67/84  bone]
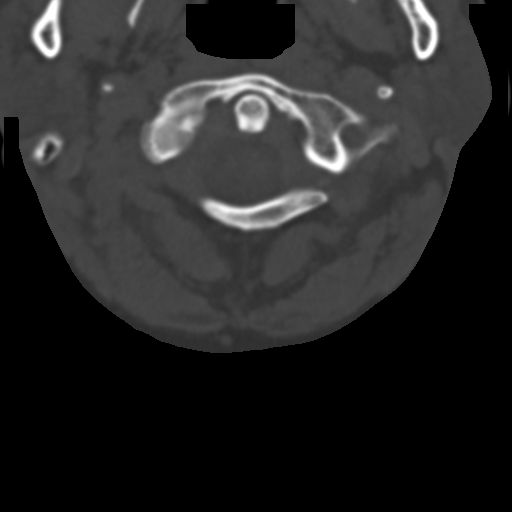

[Series 7: sagittal bone 2.0 · sagittal · 0.19mm/px · 5 of 39 slices shown, 6 images]
[im 13/39  bone]
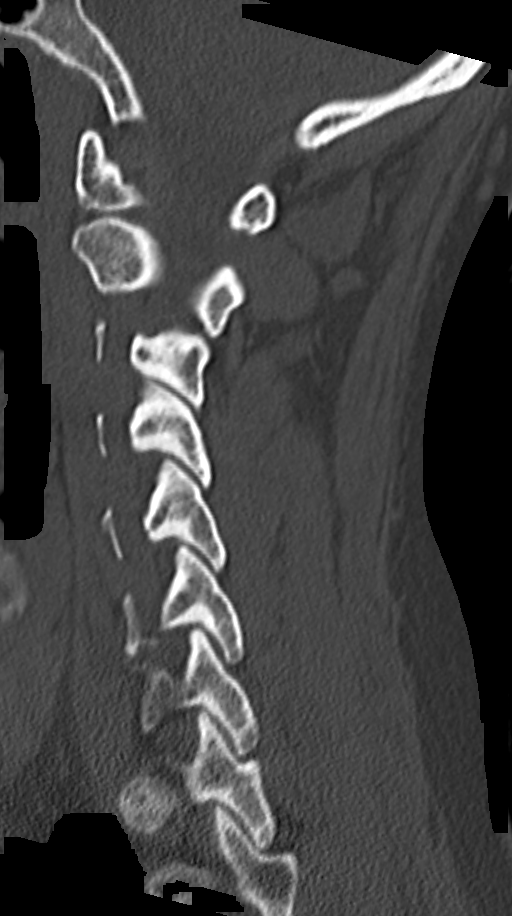
[im 16/39  bone]
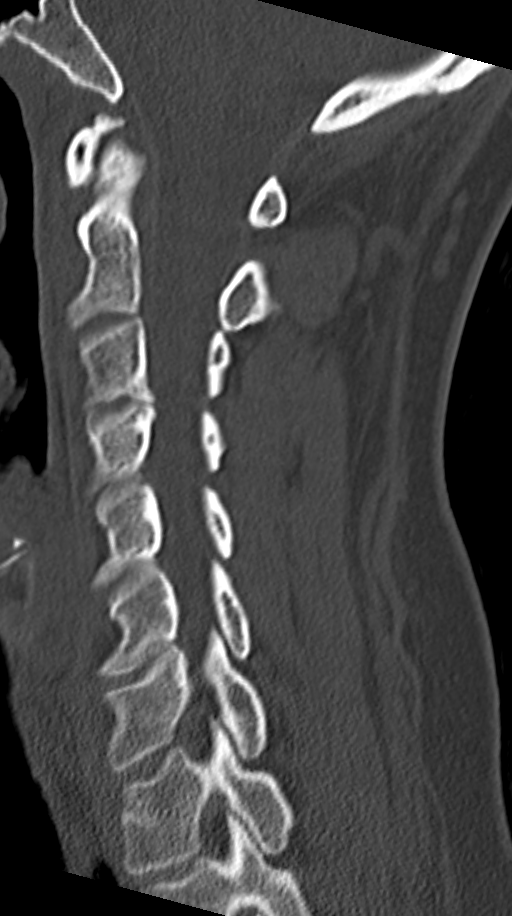
[im 20/39  soft-tissue]
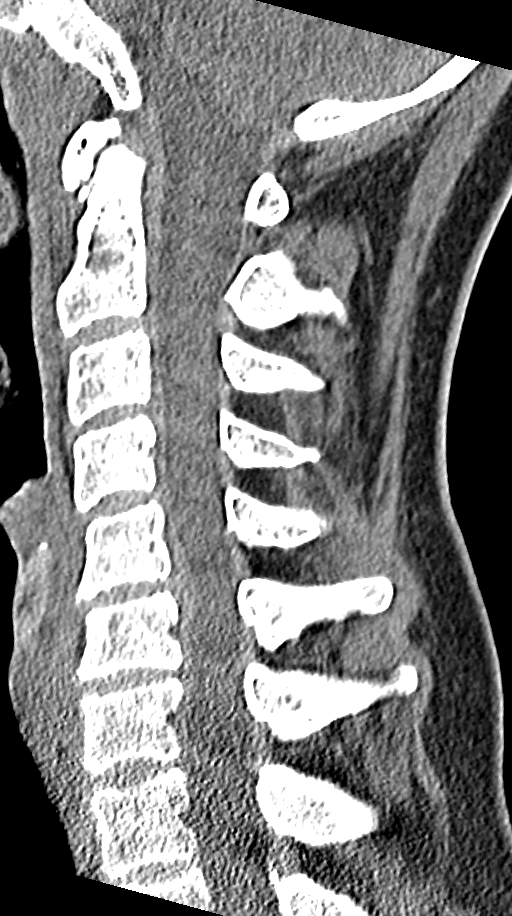
[im 20/39  bone]
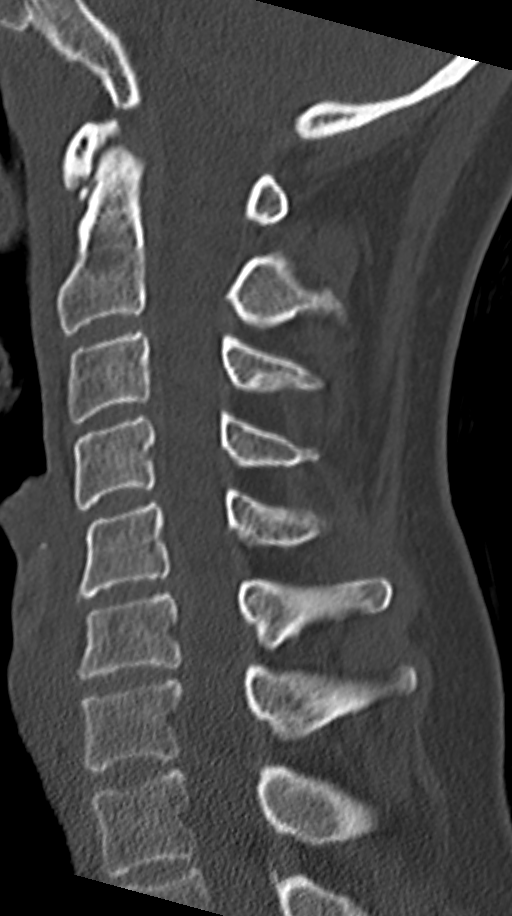
[im 23/39  bone]
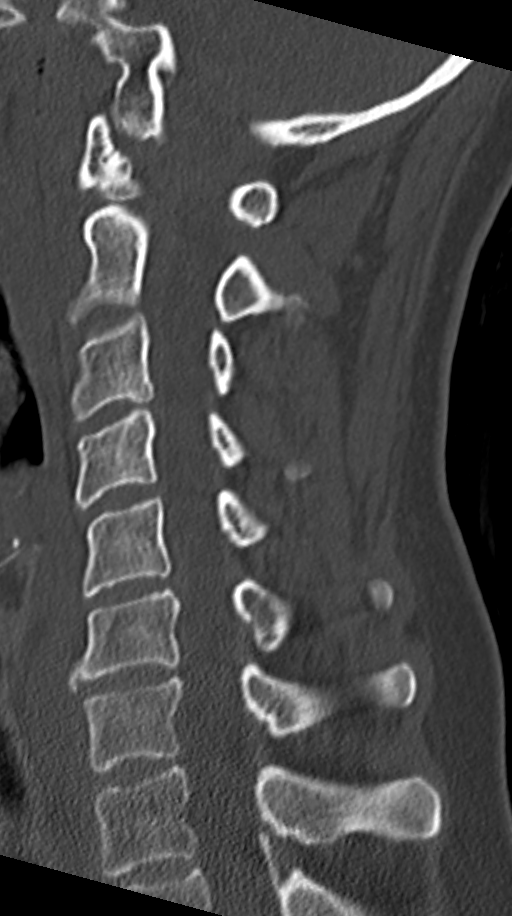
[im 26/39  bone]
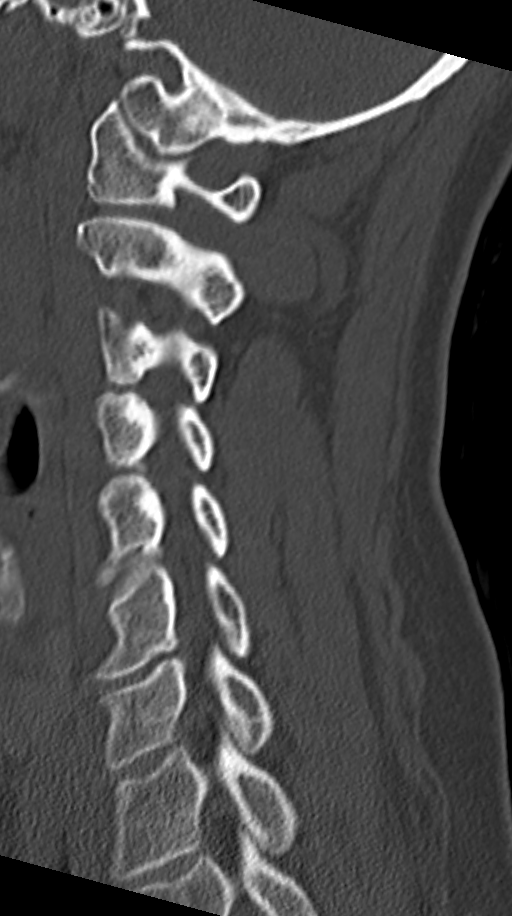

[Series 8: coronal bone 2.0 · coronal · 0.17mm/px · 3 of 53 slices shown]
[im 11/53  bone]
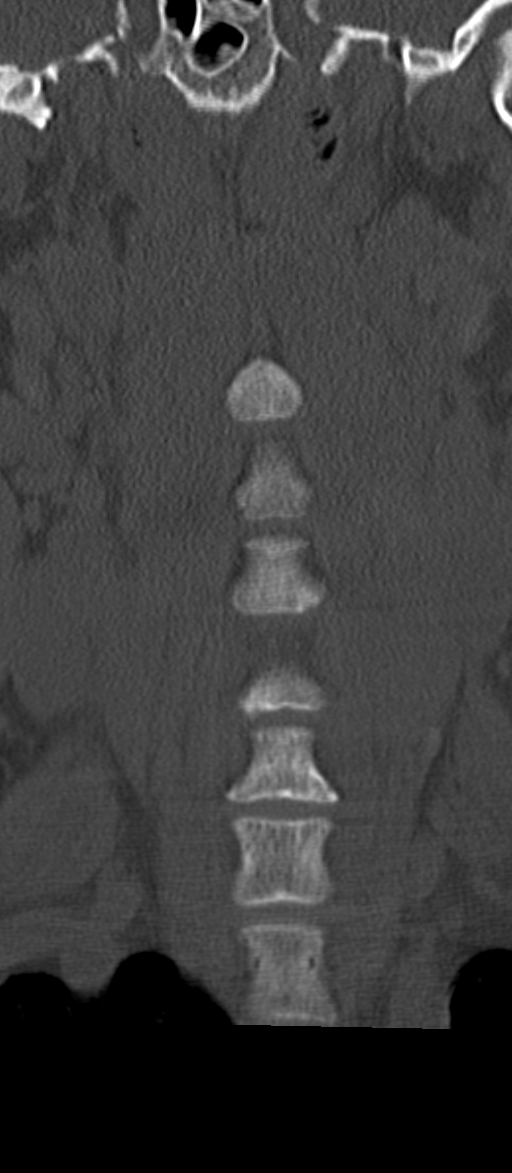
[im 21/53  bone]
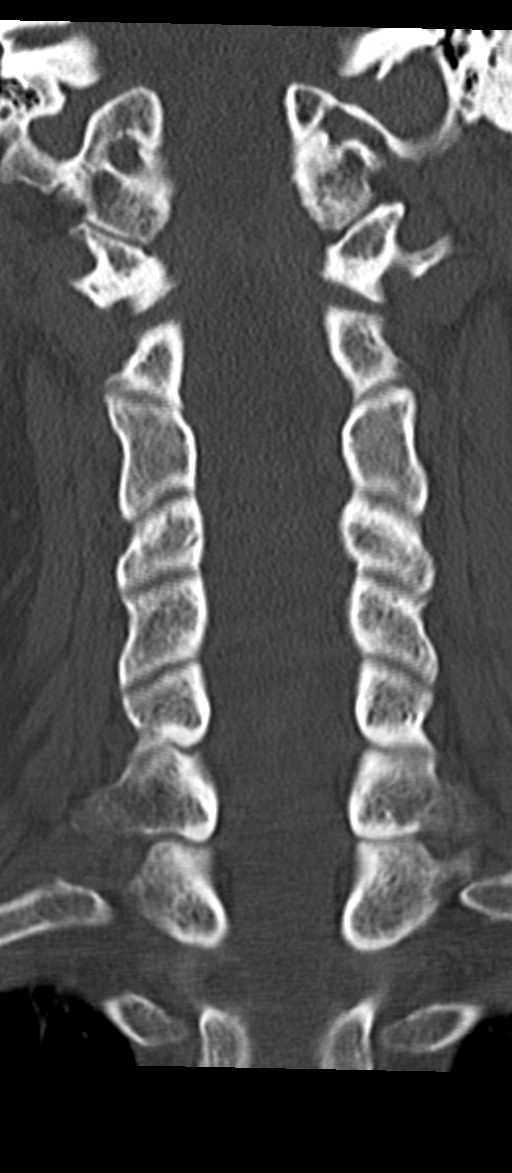
[im 32/53  bone]
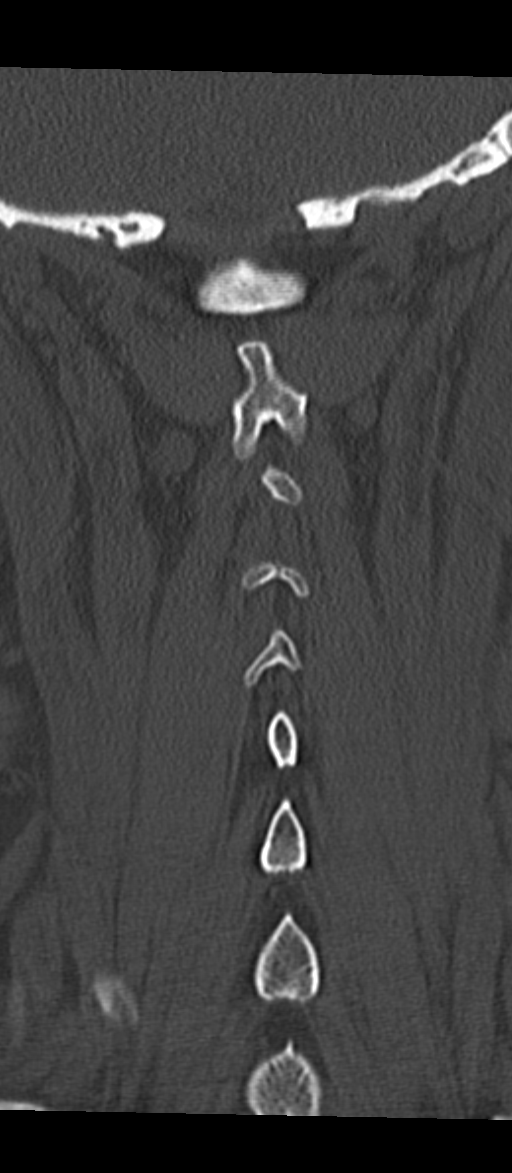

[10 of 33 positions shown; findings below may reference images not displayed]

FINDINGS: CT HEAD FINDINGS

No mass effect, midline shift, or acute hemorrhage. Brain parenchyma
and ventricular system are unremarkable. Mastoid air cells are
clear. Intact cranium

CT CERVICAL SPINE FINDINGS

No acute fracture.  No dislocation.

Lung apices clear. Normal thyroid gland. No obvious soft tissue
injury. No obvious spinal hematoma. Mild degenerative changes.
Broad-based C5-C6 disc herniation causes spinal stenosis.
IMPRESSION: No acute intracranial pathology.

No acute cervical spine injury.

C5-C6 disc herniation with spinal stenosis.

## 2015-05-15 ENCOUNTER — Ambulatory Visit (HOSPITAL_COMMUNITY)
Admission: RE | Admit: 2015-05-15 | Discharge: 2015-05-15 | Disposition: A | Discharge status: Home / Self Care | Payer: Self-pay | Source: Ambulatory Visit | Attending: Orthopaedic Surgery | Admitting: Orthopaedic Surgery

## 2015-05-15 DIAGNOSIS — M19172 Post-traumatic osteoarthritis, left ankle and foot: Secondary | ICD-10-CM | POA: Insufficient documentation

## 2015-05-15 DIAGNOSIS — R609 Edema, unspecified: Secondary | ICD-10-CM | POA: Insufficient documentation

## 2015-05-15 DIAGNOSIS — S92252G Displaced fracture of navicular [scaphoid] of left foot, subsequent encounter for fracture with delayed healing: Secondary | ICD-10-CM | POA: Insufficient documentation

## 2015-05-15 DIAGNOSIS — I96 Gangrene, not elsewhere classified: Secondary | ICD-10-CM | POA: Insufficient documentation

## 2015-05-15 LAB — HEMATOCRIT: HCT: 30.5 % — ABNORMAL LOW (ref 35.0–45.0)

## 2015-05-15 LAB — HEMOGLOBIN: Hemoglobin: 9.4 g/dL — ABNORMAL LOW (ref 11.7–15.5)

## 2015-05-16 LAB — POTASSIUM: POTASSIUM: 4.1 mmol/L (ref 3.5–5.3)

## 2015-05-21 ENCOUNTER — Encounter: Payer: Self-pay | Admitting: Orthopaedic Surgery

## 2015-05-21 ENCOUNTER — Ambulatory Visit (INDEPENDENT_AMBULATORY_CARE_PROVIDER_SITE_OTHER): Payer: Self-pay | Admitting: Orthopaedic Surgery

## 2015-05-21 VITALS — BP 137/84 | HR 82 | Temp 98.1°F | Resp 16 | Ht 65.0 in | Wt 164.0 lb

## 2015-05-21 DIAGNOSIS — M80859D Other osteoporosis with current pathological fracture, unspecified femur, subsequent encounter for fracture with routine healing: Secondary | ICD-10-CM

## 2015-05-21 DIAGNOSIS — M810 Age-related osteoporosis without current pathological fracture: Secondary | ICD-10-CM

## 2015-05-21 DIAGNOSIS — M05771 Rheumatoid arthritis with rheumatoid factor of right ankle and foot without organ or systems involvement: Secondary | ICD-10-CM

## 2015-05-21 DIAGNOSIS — S92212D Displaced fracture of cuboid bone of left foot, subsequent encounter for fracture with routine healing: Secondary | ICD-10-CM

## 2015-05-21 DIAGNOSIS — F17210 Nicotine dependence, cigarettes, uncomplicated: Secondary | ICD-10-CM

## 2015-05-21 DIAGNOSIS — M8700 Idiopathic aseptic necrosis of unspecified bone: Secondary | ICD-10-CM

## 2015-05-21 DIAGNOSIS — E559 Vitamin D deficiency, unspecified: Secondary | ICD-10-CM

## 2015-05-21 DIAGNOSIS — G8929 Other chronic pain: Secondary | ICD-10-CM

## 2015-05-21 DIAGNOSIS — M05772 Rheumatoid arthritis with rheumatoid factor of left ankle and foot without organ or systems involvement: Secondary | ICD-10-CM

## 2015-05-21 NOTE — Progress Notes (Signed)
Patient IP:[name], female DOB:Dec 20, 1965, 50 y.o. LZJ:[id number]  Chief Complaint  Patient presents with  . Follow-up    REVIEW MRI LEFT FOOT    HPI  [name] is a 50 y.o. female who returns after MRI of the left foot. She has underlying rheumatoid arthritis, osteoporosis, vitamin D deficiency.  She is on methotrexate.  She had a MRI of the left foot on 05-15-15.  It showed: IMPRESSION: 1. Severe osteoarthritis of the talonavicular joint with collapse of the medial half of the navicular as can be seen with the sequela of avascular necrosis. 2. Mild osteoarthritis of the calcaneocuboid joint. 3. Mild muscle edema in the flexor hallucis longus muscle at the musculotendinous junction most concerning for muscle strain.  The collapse of the navicular was noted on plain x-rays here in the office and the MRI ordered.  She has been wearing the CAM walker and does well with it.  She is improved with her other pains since she saw the rheumatologist.  She is under [facility] and is seeing other physicians and getting the treatment she needs now.  I will have her see Dr. Lajoyce Corners for further evaluation of the left foot and see if any surgery is appropriate at this time.  She understands she has an underlying disease that can be progressive.  HPI  Body mass index is 27.29 kg/(m^2).  Review of Systems  Constitutional: Positive for fatigue.       Patient does not have Diabetes Mellitus. Patient has hypertension. Patient has COPD or shortness of breath. Patient does not have BMI > 35. Patient has current smoking history.  HENT: Negative for congestion.   Respiratory: Negative for cough and shortness of breath.   Cardiovascular: Negative for chest pain.  Endocrine: Positive for cold intolerance.  Musculoskeletal: Positive for myalgias, back pain, joint swelling, arthralgias and gait problem.       She has rheumatoid arthritis.  Long term steroid use.  Neurological: Negative for  numbness.    Past Medical History  Diagnosis Date  . Rheumatoid arthritis(714.0)   . Chronic anemia   . Hypertension   . Arthritis, rheumatoid (HCC)   . Incidental lung nodule   . Depression   . Chronic pain   . COPD (chronic obstructive pulmonary disease) [facility])     Past Surgical History  Procedure Laterality Date  . Tubal ligation    . Endometrial ablation    . Breast cyst excision  12/31/2010    Procedure: CYST EXCISION BREAST;  Surgeon: Fabio Bering, MD;  Location: [facility];  Service: General;  Laterality: Right;  Excision Sebaceous Cyst Right Breast    Family History  Problem Relation Age of Onset  . Anesthesia problems Neg Hx   . Cancer Father     lung  . Hypertension Mother     Social History Social History  Substance Use Topics  . Smoking status: Current Every Day Smoker -- 1.00 packs/day for 30 years    Types: Cigarettes  . Smokeless tobacco: Never Used     Comment: has cut back, has not started Chantix  . Alcohol Use: No    Allergies  Allergen Reactions  . Voltaren [Diclofenac Sodium] Nausea And Vomiting  . Codeine Itching and Rash    Rash occurred when taking tylenol #3.    Current Outpatient Prescriptions  Medication Sig Dispense Refill  . alendronate (FOSAMAX) 70 MG tablet Take 70 mg by mouth once a week. Take with a full glass  of water on an empty stomach.(Monday)    . folic acid (FOLVITE) 1 MG tablet Take 1 mg by mouth daily.    Marland Kitchen HYDROcodone-acetaminophen (NORCO) 7.5-325 MG tablet Take 1 tablet by mouth every 4 (four) hours as needed for moderate pain (Must last 30 days.  Do not drive or operate machinery while taking this medicine.). 120 tablet 0  . methotrexate (RHEUMATREX) 2.5 MG tablet Take 15 mg by mouth once a week. Caution:Chemotherapy. Protect from light.(Thursday)    . Multiple Vitamin (MULTIVITAMIN WITH MINERALS) TABS Take 1 tablet by mouth daily. Reported on 05/02/2015    . naproxen (NAPROSYN) 500 MG tablet Take 500 mg by mouth 2 (two)  times daily with a meal.    . omeprazole (PRILOSEC) 40 MG capsule Take 1 capsule (40 mg total) by mouth daily. 90 capsule 1  . varenicline (CHANTIX CONTINUING MONTH PAK) 1 MG tablet Take 1 tablet (1 mg total) by mouth 2 (two) times daily. 180 tablet 1  . varenicline (CHANTIX PAK) 0.5 MG X 11 & 1 MG X 42 tablet Take one 0.5 mg tab by mouth qd for 3 days, then increase to one 0.5 mg tablet bid for 4 days, then increase to one 1 mg tablet twice daily. 53 tablet 0  . Vitamin D, Ergocalciferol, (DRISDOL) 50000 units CAPS capsule Take 50,000 Units by mouth once a week. Takes on Mondays.  2   No current facility-administered medications for this visit.     Physical Exam  Blood pressure 137/84, pulse 82, temperature 98.1 F (36.7 C), resp. rate 16, height 5\' 5"  (1.651 m), weight 164 lb (74.39 kg).  Constitutional: overall normal hygiene, normal nutrition, well developed, normal grooming, normal body habitus. Assistive device:CAM walker left  Musculoskeletal: gait and station Limp left, muscle tone and strength are normal, no tremors or atrophy is present.  .  Neurological: coordination overall normal.  Deep tendon reflex/nerve stretch intact.  Sensation normal.  Cranial nerves II-XII intact.   Skin:   normal overall no scars, lesions, ulcers or rashes. No psoriasis.  Psychiatric: Alert and oriented x 3.  Recent memory intact, remote memory unclear.  Normal mood and affect. Well groomed.  Good eye contact.  Cardiovascular: overall no swelling, no varicosities, no edema bilaterally, normal temperatures of the legs and arms, no clubbing, cyanosis and good capillary refill.  Lymphatic: palpation is normal.  Physical Exam  Musculoskeletal: Tenderness: Tender of the left foot navicular area with swelling and no erythema.  NV intact.   The right ankle is not tender and her lower back and sacrum are not tender now as they have been.  NV is intact.  The patient has been educated about the nature of  the problem(s) and counseled on treatment options.  The patient appeared to understand what I have discussed and is in agreement with it.  Encounter Diagnoses  Name Primary?  . Avascular necrosis of bone (HCC) Yes  . Fracture, cuboid, left, with routine healing, subsequent encounter   . Rheumatoid arthritis involving both feet with positive rheumatoid factor (HCC)   . Osteoporosis   . Chronic pain   . Cigarette nicotine dependence, uncomplicated   . 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  Precautions discussed.  Continue current medications.   Return to clinic to see Dr. 

## 2015-05-21 NOTE — Patient Instructions (Signed)
You will be referred to DR. Lajoyce Corners

## 2015-06-11 ENCOUNTER — Emergency Department (HOSPITAL_COMMUNITY)
Admission: EM | Admit: 2015-06-11 | Discharge: 2015-06-11 | Disposition: A | Discharge status: Home / Self Care | Payer: Self-pay | Attending: Emergency Medicine | Admitting: Emergency Medicine

## 2015-06-11 ENCOUNTER — Encounter (HOSPITAL_COMMUNITY): Payer: Self-pay | Admitting: Emergency Medicine

## 2015-06-11 DIAGNOSIS — S39012A Strain of muscle, fascia and tendon of lower back, initial encounter: Secondary | ICD-10-CM | POA: Insufficient documentation

## 2015-06-11 DIAGNOSIS — Y999 Unspecified external cause status: Secondary | ICD-10-CM | POA: Insufficient documentation

## 2015-06-11 DIAGNOSIS — S300XXA Contusion of lower back and pelvis, initial encounter: Secondary | ICD-10-CM

## 2015-06-11 DIAGNOSIS — F329 Major depressive disorder, single episode, unspecified: Secondary | ICD-10-CM | POA: Insufficient documentation

## 2015-06-11 DIAGNOSIS — Y939 Activity, unspecified: Secondary | ICD-10-CM | POA: Insufficient documentation

## 2015-06-11 DIAGNOSIS — Y929 Unspecified place or not applicable: Secondary | ICD-10-CM | POA: Insufficient documentation

## 2015-06-11 DIAGNOSIS — W182XXA Fall in (into) shower or empty bathtub, initial encounter: Secondary | ICD-10-CM | POA: Insufficient documentation

## 2015-06-11 DIAGNOSIS — I10 Essential (primary) hypertension: Secondary | ICD-10-CM | POA: Insufficient documentation

## 2015-06-11 DIAGNOSIS — M1990 Unspecified osteoarthritis, unspecified site: Secondary | ICD-10-CM | POA: Insufficient documentation

## 2015-06-11 DIAGNOSIS — J449 Chronic obstructive pulmonary disease, unspecified: Secondary | ICD-10-CM | POA: Insufficient documentation

## 2015-06-11 DIAGNOSIS — F17210 Nicotine dependence, cigarettes, uncomplicated: Secondary | ICD-10-CM | POA: Insufficient documentation

## 2015-06-11 HISTORY — DX: Age-related osteoporosis without current pathological fracture: M81.0

## 2015-06-11 MED ORDER — KETOROLAC TROMETHAMINE 60 MG/2ML IM SOLN
60.0000 mg | Freq: Once | INTRAMUSCULAR | Status: AC
  Administered 2015-06-11: 60 mg via INTRAMUSCULAR
  Filled 2015-06-11: qty 2

## 2015-06-11 MED ORDER — ONDANSETRON HCL 4 MG PO TABS
4.0000 mg | ORAL_TABLET | Freq: Once | ORAL | Status: AC
  Administered 2015-06-11: 4 mg via ORAL
  Filled 2015-06-11: qty 1

## 2015-06-11 MED ORDER — METHOCARBAMOL 500 MG PO TABS
1000.0000 mg | ORAL_TABLET | Freq: Once | ORAL | Status: AC
  Administered 2015-06-11: 1000 mg via ORAL
  Filled 2015-06-11: qty 2

## 2015-06-11 NOTE — ED Provider Notes (Signed)
CSN: [id number]     Arrival date & time 06/11/15  1053 History   First MD Initiated Contact with Patient 06/11/15 1147     Chief Complaint  Patient presents with  . Fall     (Consider location/radiation/quality/duration/timing/severity/associated sxs/prior Treatment) HPI Comments: Patient is a 50 year old female who presents to the emergency department with a complaint of back pain following a fall.  The patient states that she sustained a fall in her shower on yesterday. She states her back caught the edge of the tub. She states she also had a little small raised area on the back of her head on. She denies any loss of consciousness. She denies any loss of control of her bowels or bladder. She has pain with change of position, but states she is able to walk very much at her baseline given her other chronic issues concerning her joints in her extremities. She was concerned because she says that at times she has some pain when she takes a deep breath. She wanted to be evaluated concerning this fall.  Patient is a 50 y.o. female presenting with fall. The history is provided by the patient.  Fall Associated symptoms include arthralgias.    Past Medical History  Diagnosis Date  . Rheumatoid arthritis(714.0)   . Chronic anemia   . Hypertension   . Arthritis, rheumatoid (HCC)   . Incidental lung nodule   . Depression   . Chronic pain   . COPD (chronic obstructive pulmonary disease) (HCC)   . Osteoporosis    Past Surgical History  Procedure Laterality Date  . Tubal ligation    . Endometrial ablation    . Breast cyst excision  12/31/2010    Procedure: CYST EXCISION BREAST;  Surgeon: Fabio Bering, MD;  Location: [facility];  Service: General;  Laterality: Right;  Excision Sebaceous Cyst Right Breast   Family History  Problem Relation Age of Onset  . Anesthesia problems Neg Hx   . Cancer Father     lung  . Hypertension Mother    Social History  Substance Use Topics  . Smoking  status: Current Every Day Smoker -- 0.50 packs/day for 30 years    Types: Cigarettes  . Smokeless tobacco: Never Used     Comment: has cut back, has not started Chantix  . Alcohol Use: No   OB History    Gravida Para Term Preterm AB TAB SAB Ectopic Multiple Living   6 5 5  1  1   5      Review of Systems  Musculoskeletal: Positive for back pain and arthralgias.  All other systems reviewed and are negative.     Allergies  Voltaren and Codeine  Home Medications   Prior to Admission medications   Medication Sig Start Date End Date Taking? Authorizing Provider  alendronate (FOSAMAX) 70 MG tablet Take 70 mg by mouth once a week. Take with a full glass of water on an empty stomach.(Monday)    Historical Provider, MD  folic acid (FOLVITE) 1 MG tablet Take 1 mg by mouth daily.    Historical Provider, MD  HYDROcodone-acetaminophen (NORCO) 7.5-325 MG tablet Take 1 tablet by mouth every 4 (four) hours as needed for moderate pain (Must last 30 days.  Do not drive or operate machinery while taking this medicine.). 05/08/15   05/10/15, MD  methotrexate (RHEUMATREX) 2.5 MG tablet Take 15 mg by mouth once a week. Caution:Chemotherapy. Protect from light.(Thursday)    Historical Provider, MD  Multiple Vitamin (  MULTIVITAMIN WITH MINERALS) TABS Take 1 tablet by mouth daily. Reported on 05/02/2015    Historical Provider, MD  naproxen (NAPROSYN) 500 MG tablet Take 500 mg by mouth 2 (two) times daily with a meal.    Historical Provider, MD  omeprazole (PRILOSEC) 40 MG capsule Take 1 capsule (40 mg total) by mouth daily. 12/27/14   Jacquelin Hawking, PA-C  varenicline (CHANTIX CONTINUING MONTH PAK) 1 MG tablet Take 1 tablet (1 mg total) by mouth 2 (two) times daily. 03/11/15   Jacquelin Hawking, PA-C  varenicline (CHANTIX PAK) 0.5 MG X 11 & 1 MG X 42 tablet Take one 0.5 mg tab by mouth qd for 3 days, then increase to one 0.5 mg tablet bid for 4 days, then increase to one 1 mg tablet twice daily. 03/11/15    Jacquelin Hawking, PA-C  Vitamin D, Ergocalciferol, (DRISDOL) 50000 units CAPS capsule Take 50,000 Units by mouth once a week. Takes on Mondays. 02/14/15   Historical Provider, MD   BP 134/92 mmHg  Pulse 69  Temp(Src) 98 F (36.7 C) (Oral)  Resp 16  Ht 5\' 6"  (1.676 m)  Wt 74.39 kg  BMI 26.48 kg/m2  SpO2 100% Physical Exam  Constitutional: She is oriented to person, place, and time. She appears well-developed and well-nourished.  Non-toxic appearance.  HENT:  Head: Normocephalic.  Right Ear: Tympanic membrane and external ear normal.  Left Ear: Tympanic membrane and external ear normal.  Small hematoma of the occipital area. Negative Battle's sign.  Eyes: EOM and lids are normal. Pupils are equal, round, and reactive to light.  Neck: Normal range of motion. Neck supple. Carotid bruit is not present.  Cardiovascular: Normal rate, regular rhythm, normal heart sounds, intact distal pulses and normal pulses.   Pulmonary/Chest: Breath sounds normal. No respiratory distress.  Abdominal: Soft. Bowel sounds are normal. There is no tenderness. There is no guarding.  Abdomen is soft with good bowel sounds. There is no splenomegaly. There is no hepatomegaly appreciated.  Musculoskeletal: Normal range of motion.       Lumbar back: She exhibits tenderness and spasm. She exhibits no deformity.       Back:  No visual deformity and no palpable step-off of the cervical spine. No visual deformity and no palpable step-off of the lumbar spine. There is soreness of the paraspinal area of the upper lumbar region. This can easily be reproduced with range of motion. Patient has Cam Walkers on the right and the left.  Lymphadenopathy:       Head (right side): No submandibular adenopathy present.       Head (left side): No submandibular adenopathy present.    She has no cervical adenopathy.  Neurological: She is alert and oriented to person, place, and time. She has normal strength. No cranial nerve deficit or  sensory deficit.  Skin: Skin is warm and dry.  Psychiatric: She has a normal mood and affect. Her speech is normal.  Nursing note and vitals reviewed.   ED Course  Procedures (including critical care time) Labs Review Labs Reviewed - No data to display  Imaging Review No results found. I have personally reviewed and evaluated these images and lab results as part of my medical decision-making.   EKG Interpretation None      MDM  The patient speaks in complete sentences without problem. There is symmetrical rise and fall of the chest. The pulse oximetry is 100% on room air. The patient's gait is at baseline with bilateral cam walkers  in place. Suspect the patient has a muscle strain of the lower back, accompanied by a contusion to the lower back. The patient has medication for inflammation and pain. Will add muscle relaxer (Robaxin). Patient is to follow-up with her primary physician for additional evaluation.    Final diagnoses:  None    **I have reviewed nursing notes, vital signs, and all appropriate lab and imaging results for this patient.Ivery Quale, PA-C 06/11/15 1239  Blane Ohara, MD 06/12/15 [phone]

## 2015-06-11 NOTE — Discharge Instructions (Signed)

## 2015-06-11 NOTE — ED Notes (Signed)
Pt reports falling in shower yesterday. Pt c/o knot on back of head and back pain after fall. Denies LOC. Pt reports taking a Percocet at 0600 this morning.
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
## 2015-06-24 ENCOUNTER — Other Ambulatory Visit: Payer: Self-pay | Admitting: Physician Assistant

## 2015-06-24 DIAGNOSIS — M069 Rheumatoid arthritis, unspecified: Secondary | ICD-10-CM

## 2015-06-24 DIAGNOSIS — Z79899 Other long term (current) drug therapy: Secondary | ICD-10-CM

## 2015-06-26 LAB — CBC

## 2015-06-27 LAB — HEPATIC FUNCTION PANEL
ALBUMIN: 4.1 g/dL (ref 3.6–5.1)
ALT: 21 U/L (ref 6–29)
AST: 18 U/L (ref 10–35)
Alkaline Phosphatase: 117 U/L — ABNORMAL HIGH (ref 33–115)
BILIRUBIN TOTAL: 0.3 mg/dL (ref 0.2–1.2)
Bilirubin, Direct: 0 mg/dL (ref <=0.2)
Indirect Bilirubin: 0.3 mg/dL (ref 0.2–1.2)
Total Protein: 6.6 g/dL (ref 6.1–8.1)

## 2015-06-27 LAB — SEDIMENTATION RATE: SED RATE: 44 mm/h — AB (ref 0–20)

## 2015-06-27 LAB — CREATININE, SERUM: CREATININE: 0.52 mg/dL (ref 0.50–1.10)

## 2015-06-29 LAB — QUANTIFERON TB GOLD ASSAY (BLOOD)
INTERFERON GAMMA RELEASE ASSAY: NEGATIVE
Mitogen-Nil: >10 IU/mL
Quantiferon Nil Value: 0.05 IU/mL
Quantiferon Tb Ag Minus Nil Value: 0 IU/mL

## 2015-07-03 ENCOUNTER — Other Ambulatory Visit: Payer: Self-pay | Admitting: Physician Assistant

## 2015-07-03 DIAGNOSIS — M069 Rheumatoid arthritis, unspecified: Secondary | ICD-10-CM

## 2015-07-03 LAB — CBC
HEMATOCRIT: 28.3 % — AB (ref 35.0–45.0)
Hemoglobin: 8.7 g/dL — ABNORMAL LOW (ref 11.7–15.5)
MCH: 23.8 pg — ABNORMAL LOW (ref 27.0–33.0)
MCHC: 30.7 g/dL — AB (ref 32.0–36.0)
MCV: 77.3 fL — ABNORMAL LOW (ref 80.0–100.0)
MPV: 9.3 fL (ref 7.5–12.5)
Platelets: 484 10*3/uL — ABNORMAL HIGH (ref 140–400)
RBC: 3.66 MIL/uL — ABNORMAL LOW (ref 3.80–5.10)
RDW: 19.8 % — AB (ref 11.0–15.0)
WBC: 6.7 10*3/uL (ref 3.8–10.8)
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
## 2015-07-15 ENCOUNTER — Other Ambulatory Visit: Payer: Self-pay | Admitting: Physician Assistant

## 2015-07-15 DIAGNOSIS — D649 Anemia, unspecified: Secondary | ICD-10-CM

## 2015-07-15 DIAGNOSIS — E876 Hypokalemia: Secondary | ICD-10-CM

## 2015-07-24 LAB — HEMATOCRIT: HEMATOCRIT: 30.6 % — AB (ref 35.0–45.0)

## 2015-07-24 LAB — HEMOGLOBIN: Hemoglobin: 9.4 g/dL — ABNORMAL LOW (ref 11.7–15.5)

## 2015-07-25 ENCOUNTER — Encounter: Payer: Self-pay | Admitting: Physician Assistant

## 2015-07-25 ENCOUNTER — Ambulatory Visit: Payer: Self-pay | Admitting: Physician Assistant

## 2015-07-25 ENCOUNTER — Other Ambulatory Visit: Payer: Self-pay | Admitting: Physician Assistant

## 2015-07-25 VITALS — BP 116/74 | HR 96 | Temp 98.1°F | Ht 65.0 in | Wt 147.8 lb

## 2015-07-25 DIAGNOSIS — F17210 Nicotine dependence, cigarettes, uncomplicated: Secondary | ICD-10-CM

## 2015-07-25 DIAGNOSIS — D649 Anemia, unspecified: Secondary | ICD-10-CM

## 2015-07-25 DIAGNOSIS — M810 Age-related osteoporosis without current pathological fracture: Secondary | ICD-10-CM

## 2015-07-25 DIAGNOSIS — M069 Rheumatoid arthritis, unspecified: Secondary | ICD-10-CM

## 2015-07-25 DIAGNOSIS — Z8639 Personal history of other endocrine, nutritional and metabolic disease: Secondary | ICD-10-CM

## 2015-07-25 LAB — POTASSIUM: POTASSIUM: 4.2 mmol/L (ref 3.5–5.3)

## 2015-07-25 NOTE — Progress Notes (Signed)
BP 116/74 mmHg  Pulse 96  Temp(Src) 98.1 F (36.7 C)  Ht 5\' 5"  (1.651 m)  Wt 147 lb 12.8 oz (67.042 kg)  BMI 24.60 kg/m2  SpO2 95%   Subjective:    Patient ID: , female    DOB: 1965/09/28, 50 y.o.   MRN: [id number]  HPI: [name] is a 50 y.o. female presenting on 07/25/2015 for Anemia   HPI Pt still going to [facility] to see rheumatologist She says she is doing well except for fatigued .  She is very happy to be off prednisone and her weight is getting back down to where it should be.  No menses since 2013 (had endometrial ablation)  She is still smoking. She hasn't started on her chantix yet  Relevant past medical, surgical, family and social history reviewed and updated as indicated. Interim medical history since our last visit reviewed. Allergies and medications reviewed and updated.  Current outpatient prescriptions:  .  alendronate (FOSAMAX) 70 MG tablet, Take 70 mg by mouth once a week. Take with a full glass of water on an empty stomach.(Monday), Disp: , Rfl:  .  folic acid (FOLVITE) 1 MG tablet, Take 1 mg by mouth daily., Disp: , Rfl:  .  IRON PO, Take 1 tablet by mouth daily., Disp: , Rfl:  .  methotrexate (RHEUMATREX) 2.5 MG tablet, Take 15 mg by mouth once a week. Caution:Chemotherapy. Protect from light.(Thursday), Disp: , Rfl:  .  Multiple Vitamin (MULTIVITAMIN WITH MINERALS) TABS, Take 1 tablet by mouth daily. Reported on 05/02/2015, Disp: , Rfl:  .  naproxen (NAPROSYN) 500 MG tablet, Take 500 mg by mouth 2 (two) times daily with a meal., Disp: , Rfl:  .  omeprazole (PRILOSEC) 40 MG capsule, Take 1 capsule (40 mg total) by mouth daily., Disp: 90 capsule, Rfl: 1 .  oxyCODONE-acetaminophen (PERCOCET/ROXICET) 5-325 MG tablet, Take 1 tablet by mouth 2 (two) times daily as needed for severe pain., Disp: , Rfl:  .  varenicline (CHANTIX CONTINUING MONTH PAK) 1 MG tablet, Take 1 tablet (1 mg total) by mouth 2 (two) times daily. (Patient not taking: Reported on  07/25/2015), Disp: 180 tablet, Rfl: 1 .  varenicline (CHANTIX PAK) 0.5 MG X 11 & 1 MG X 42 tablet, Take one 0.5 mg tab by mouth qd for 3 days, then increase to one 0.5 mg tablet bid for 4 days, then increase to one 1 mg tablet twice daily. (Patient not taking: Reported on 07/25/2015), Disp: 53 tablet, Rfl: 0 .  Vitamin D, Ergocalciferol, (DRISDOL) 50000 units CAPS capsule, Take 50,000 Units by mouth once a week. Reported on 07/25/2015, Disp: , Rfl: 2   Review of Systems  Constitutional: Positive for appetite change and fatigue. Negative for fever, chills, diaphoresis and unexpected weight change.  HENT: Positive for dental problem. Negative for congestion, drooling, ear pain, facial swelling, hearing loss, mouth sores, sneezing, sore throat, trouble swallowing and voice change.   Eyes: Negative for pain, discharge, redness, itching and visual disturbance.  Respiratory: Positive for shortness of breath. Negative for cough, choking and wheezing.   Cardiovascular: Negative for chest pain, palpitations and leg swelling.  Gastrointestinal: Negative for vomiting, abdominal pain, diarrhea, constipation and blood in stool.  Endocrine: Positive for polydipsia. Negative for cold intolerance and heat intolerance.  Genitourinary: Negative for dysuria, hematuria and decreased urine volume.  Musculoskeletal: Positive for arthralgias and gait problem. Negative for back pain.  Skin: Negative for rash.  Allergic/Immunologic: Positive for environmental allergies.  Neurological: Positive for headaches. Negative for seizures, syncope and light-headedness.  Hematological: Negative for adenopathy.  Psychiatric/Behavioral: Positive for dysphoric mood and agitation. Negative for suicidal ideas. The patient is nervous/anxious.     Per HPI unless specifically indicated above     Objective:    BP 116/74 mmHg  Pulse 96  Temp(Src) 98.1 F (36.7 C)  Ht 5\' 5"  (1.651 m)  Wt 147 lb 12.8 oz (67.042 kg)  BMI 24.60 kg/m2   SpO2 95%  Wt Readings from Last 3 Encounters:  07/25/15 147 lb 12.8 oz (67.042 kg)  06/11/15 164 lb (74.39 kg)  05/21/15 164 lb (74.39 kg)    Physical Exam  Constitutional: She is oriented to person, place, and time. She appears well-developed and well-nourished.  HENT:  Head: Normocephalic and atraumatic.  Neck: Neck supple.  Cardiovascular: Normal rate and regular rhythm.   Pulmonary/Chest: Effort normal and breath sounds normal.  Abdominal: Soft. Bowel sounds are normal. She exhibits no mass. There is no hepatosplenomegaly. There is no tenderness.  Musculoskeletal: She exhibits no edema.  Lymphadenopathy:    She has no cervical adenopathy.  Neurological: She is alert and oriented to person, place, and time.  Skin: Skin is warm and dry.  Psychiatric: She has a normal mood and affect. Her behavior is normal.  Vitals reviewed.   Results for orders placed or performed in visit on 07/15/15  Potassium  Result Value Ref Range   Potassium 4.2 3.5 - 5.3 mmol/L  Hemoglobin  Result Value Ref Range   Hemoglobin 9.4 (L) 11.7 - 15.5 g/dL  Hematocrit  Result Value Ref Range   HCT 30.6 (L) 35.0 - 45.0 %      Assessment & Plan:   Encounter Diagnoses  Name Primary?  09/15/15 Anemia, unspecified anemia type Yes  . Rheumatoid arthritis involving multiple sites, unspecified rheumatoid factor presence (HCC)   . Osteoporosis   . Cigarette nicotine dependence, uncomplicated   . History of hyperlipidemia     -reviewed labs with pt -pt to continue Iron for anemia -Gave iFOBT to check for occult blood in light of anemia -Will plan for pap at next OV -F/u 3 months.  RTO sooner prn

## 2015-08-01 ENCOUNTER — Other Ambulatory Visit: Payer: Self-pay | Admitting: Physician Assistant

## 2015-08-01 ENCOUNTER — Ambulatory Visit: Payer: Self-pay | Admitting: Physician Assistant

## 2015-08-01 DIAGNOSIS — M069 Rheumatoid arthritis, unspecified: Secondary | ICD-10-CM

## 2015-09-11 ENCOUNTER — Ambulatory Visit: Payer: Self-pay | Admitting: "Endocrinology

## 2015-09-23 ENCOUNTER — Other Ambulatory Visit: Payer: Self-pay | Admitting: Physician Assistant

## 2015-09-23 LAB — CBC WITH DIFFERENTIAL/PLATELET
Basophils Absolute: 0 cells/uL (ref 0–200)
Basophils Relative: 0 %
EOS PCT: 7 %
Eosinophils Absolute: 385 cells/uL (ref 15–500)
HCT: 29.9 % — ABNORMAL LOW (ref 35.0–45.0)
HEMOGLOBIN: 9.6 g/dL — AB (ref 11.7–15.5)
LYMPHS ABS: 1925 {cells}/uL (ref 850–3900)
Lymphocytes Relative: 35 %
MCH: 24.7 pg — ABNORMAL LOW (ref 27.0–33.0)
MCHC: 32.1 g/dL (ref 32.0–36.0)
MCV: 77.1 fL — ABNORMAL LOW (ref 80.0–100.0)
MPV: 9.5 fL (ref 7.5–12.5)
Monocytes Absolute: 165 cells/uL — ABNORMAL LOW (ref 200–950)
Monocytes Relative: 3 %
NEUTROS ABS: 3025 {cells}/uL (ref 1500–7800)
NEUTROS PCT: 55 %
Platelets: 515 10*3/uL — ABNORMAL HIGH (ref 140–400)
RBC: 3.88 MIL/uL (ref 3.80–5.10)
RDW: 20.6 % — ABNORMAL HIGH (ref 11.0–15.0)
WBC: 5.5 10*3/uL (ref 3.8–10.8)

## 2015-09-23 LAB — COMPLETE METABOLIC PANEL WITH GFR
ALBUMIN: 3.7 g/dL (ref 3.6–5.1)
ALK PHOS: 97 U/L (ref 33–115)
ALT: 9 U/L (ref 6–29)
AST: 15 U/L (ref 10–35)
BUN: 13 mg/dL (ref 7–25)
CALCIUM: 9.5 mg/dL (ref 8.6–10.2)
CO2: 26 mmol/L (ref 20–31)
Chloride: 106 mmol/L (ref 98–110)
Creat: 0.75 mg/dL (ref 0.50–1.10)
Glucose, Bld: 106 mg/dL — ABNORMAL HIGH (ref 65–99)
POTASSIUM: 4 mmol/L (ref 3.5–5.3)
Sodium: 140 mmol/L (ref 135–146)
Total Bilirubin: 0.3 mg/dL (ref 0.2–1.2)
Total Protein: 6.7 g/dL (ref 6.1–8.1)

## 2015-09-23 LAB — SEDIMENTATION RATE: SED RATE: 26 mm/h — AB (ref 0–20)

## 2015-09-25 ENCOUNTER — Ambulatory Visit: Payer: Self-pay | Admitting: "Endocrinology

## 2015-09-25 ENCOUNTER — Encounter: Payer: Self-pay | Admitting: "Endocrinology

## 2015-09-25 ENCOUNTER — Ambulatory Visit (INDEPENDENT_AMBULATORY_CARE_PROVIDER_SITE_OTHER): Payer: Self-pay | Admitting: "Endocrinology

## 2015-09-25 VITALS — BP 122/86 | HR 85 | Ht 65.0 in | Wt 136.4 lb

## 2015-09-25 DIAGNOSIS — E0590 Thyrotoxicosis, unspecified without thyrotoxic crisis or storm: Secondary | ICD-10-CM

## 2015-09-25 NOTE — Progress Notes (Signed)
Subjective:    Patient ID: [name], female    DOB: 01-20-1965, PCP Monica Hawking, PA-C   Past Medical History:  Diagnosis Date  . Arthritis, rheumatoid (HCC)   . Chronic anemia   . Chronic pain   . COPD (chronic obstructive pulmonary disease) (HCC)   . Depression   . Hypertension   . Incidental lung nodule   . Osteoporosis   . Rheumatoid arthritis(714.0)    Past Surgical History:  Procedure Laterality Date  . BREAST CYST EXCISION  12/31/2010   Procedure: CYST EXCISION BREAST;  Surgeon: Fabio Bering, MD;  Location: [facility];  Service: General;  Laterality: Right;  Excision Sebaceous Cyst Right Breast  . ENDOMETRIAL ABLATION    . TUBAL LIGATION     Social History   Social History  . Marital status: Single    Spouse name: N/A  . Number of children: N/A  . Years of education: N/A   Social History Main Topics  . Smoking status: Current Every Day Smoker    Packs/day: 0.50    Years: 30.00    Types: Cigarettes  . Smokeless tobacco: Never Used     Comment: has cut back, has not started Chantix  . Alcohol use No  . Drug use: No  . Sexual activity: No   Other Topics Concern  . None   Social History Narrative  . None   Outpatient Encounter Prescriptions as of 09/25/2015  Medication Sig  . alendronate (FOSAMAX) 70 MG tablet Take 70 mg by mouth once a week. Take with a full glass of water on an empty stomach.(Monday)  . folic acid (FOLVITE) 1 MG tablet Take 1 mg by mouth daily.  . IRON PO Take 1 tablet by mouth daily.  . methotrexate (RHEUMATREX) 2.5 MG tablet Take 15 mg by mouth once a week. Caution:Chemotherapy. Protect from light.(Thursday)  . Multiple Vitamin (MULTIVITAMIN WITH MINERALS) TABS Take 1 tablet by mouth daily. Reported on 05/02/2015  . naproxen (NAPROSYN) 500 MG tablet Take 500 mg by mouth 2 (two) times daily with a meal.  . omeprazole (PRILOSEC) 40 MG capsule Take 1 capsule (40 mg total) by mouth daily.  . varenicline (CHANTIX CONTINUING MONTH  PAK) 1 MG tablet Take 1 tablet (1 mg total) by mouth 2 (two) times daily.  . [DISCONTINUED] oxyCODONE-acetaminophen (PERCOCET/ROXICET) 5-325 MG tablet Take 1 tablet by mouth 2 (two) times daily as needed for severe pain.  . [DISCONTINUED] varenicline (CHANTIX PAK) 0.5 MG X 11 & 1 MG X 42 tablet Take one 0.5 mg tab by mouth qd for 3 days, then increase to one 0.5 mg tablet bid for 4 days, then increase to one 1 mg tablet twice daily. (Patient not taking: Reported on 07/25/2015)  . [DISCONTINUED] Vitamin D, Ergocalciferol, (DRISDOL) 50000 units CAPS capsule Take 50,000 Units by mouth once a week. Reported on 07/25/2015   No facility-administered encounter medications on file as of 09/25/2015.    ALLERGIES: Allergies  Allergen Reactions  . Voltaren [Diclofenac Sodium] Nausea And Vomiting  . Codeine Itching and Rash    Rash occurred when taking tylenol #3.   VACCINATION STATUS: Immunization History  Administered Date(s) Administered  . Tdap 07/02/2012    HPI  [name] is a patient with above medical history. She is here to follow-up for history of subclinical hyperthyroidism. However she did not perform her previsit thyroid function test.  -Since her last visit she says she was tapered off of steroids for treatment of  rheumatoid arthritis.  This resulted in her weight loss of 28 pounds since May 2017. She was recently diagnosed with osteoporosis and started on Fosamax 70 mg weekly followed by rheumatology.   She denies any personal history of goiter nor family history of thyroid cancer.   -  she is on methotrexate ,  She is a chronic smoker.  She  Denies palpitations, heat intolerance/is waking. She still reports  anxiety.   Review of Systems Constitutional: + weight loss , +fatigue, no subjective hyperthermia/hypothermia Eyes: no blurry vision, no xerophthalmia ENT: no sore throat, no nodules palpated in throat, no dysphagia/odynophagia, no hoarseness Cardiovascular: no  CP/SOB/palpitations/leg swelling Respiratory: no cough/SOB Gastrointestinal: no N/V/D/C Musculoskeletal: Diffuse muscle and joint pains on opioids and methotrexate. Skin: no rashes Neurological: - tremors Psychiatric: +anxiety  Objective:    BP 122/86   Pulse 85   Ht 5\' 5"  (1.651 m)   Wt 136 lb 6.4 oz (61.9 kg)   BMI 22.70 kg/m   Wt Readings from Last 3 Encounters:  09/25/15 136 lb 6.4 oz (61.9 kg)  07/25/15 147 lb 12.8 oz (67 kg)  06/11/15 164 lb (74.4 kg)    Physical Exam Constitutional: She is more stable state of mind  in NAD. Eyes: PERRLA, EOMI, no exophthalmos ENT: moist mucous membranes, no thyromegaly, no cervical lymphadenopathy Cardiovascular:   Tachycardia resolved . Respiratory: Diffuse wheezes and rales. Gastrointestinal: abdomen soft, NT, ND, BS+ Musculoskeletal: no deformities, strength intact in all 4 Skin: moist, warm, no rashes Neurological: Mild tremors of outstretched hands,  DTR normal in all 4  CMP ( most recent) CMP     Component Value Date/Time   NA 140 09/23/2015 0836   K 4.0 09/23/2015 0836   CL 106 09/23/2015 0836   CO2 26 09/23/2015 0836   GLUCOSE 106 (H) 09/23/2015 0836   BUN 13 09/23/2015 0836   CREATININE 0.75 09/23/2015 0836   CALCIUM 9.5 09/23/2015 0836   PROT 6.7 09/23/2015 0836   ALBUMIN 3.7 09/23/2015 0836   AST 15 09/23/2015 0836   ALT 9 09/23/2015 0836   ALKPHOS 97 09/23/2015 0836   BILITOT 0.3 09/23/2015 0836   GFRNONAA >89 09/23/2015 0836   GFRAA >89 09/23/2015 0836    No recent thyroid function tests since February 2017.   Assessment & Plan:   1.Subclinical hyperthyroidism: -She will need repeat thyroid function test which she can do today. -She will return in 1 week for reevaluation.  -No gross goiter on physical exam hence no need for thyroid ultrasound for now. Of note patient has taken prednisone  for  more than a year  until it was discontinued since her last visit. She has had cushingoid appearance, which is  now improving.     she has history  of chronic heavy smoking with risk of COPD. I have counseled her against smoking. -She is now on Fosamax for osteoporosis being followed by rheumatology. - I advised patient to maintain close follow up with March 2017, PA-C for primary care needs. Follow up plan: Return in about 1 week (around 10/02/2015) for labs today.  10/04/2015, MD Phone: [phone]  Fax: [phone]   09/25/2015, 3:46 PM

## 2015-09-26 ENCOUNTER — Other Ambulatory Visit: Payer: Self-pay | Admitting: Physician Assistant

## 2015-09-26 LAB — T4, FREE: FREE T4: 1.1 ng/dL (ref 0.8–1.8)

## 2015-09-26 LAB — T3, FREE: T3, Free: 3.4 pg/mL (ref 2.3–4.2)

## 2015-09-26 LAB — TSH: TSH: 1.9 m[IU]/L

## 2015-10-03 ENCOUNTER — Ambulatory Visit: Payer: Self-pay | Admitting: "Endocrinology

## 2015-10-08 ENCOUNTER — Ambulatory Visit: Payer: Self-pay | Admitting: "Endocrinology

## 2015-10-15 ENCOUNTER — Encounter: Payer: Self-pay | Admitting: "Endocrinology

## 2015-10-15 ENCOUNTER — Ambulatory Visit (INDEPENDENT_AMBULATORY_CARE_PROVIDER_SITE_OTHER): Payer: Self-pay | Admitting: "Endocrinology

## 2015-10-15 ENCOUNTER — Other Ambulatory Visit: Payer: Self-pay | Admitting: "Endocrinology

## 2015-10-15 VITALS — BP 142/90 | HR 72 | Ht 65.0 in | Wt 139.0 lb

## 2015-10-15 DIAGNOSIS — E0590 Thyrotoxicosis, unspecified without thyrotoxic crisis or storm: Secondary | ICD-10-CM

## 2015-10-15 NOTE — Progress Notes (Signed)
Subjective:    Patient ID: [name], female    DOB: 1965/12/16, PCP Soyla Dryer, PA-C   Past Medical History:  Diagnosis Date  . Arthritis, rheumatoid ([place])   . Chronic anemia   . Chronic pain   . COPD (chronic obstructive pulmonary disease) ([place])   . Depression   . Hypertension   . Incidental lung nodule   . Osteoporosis   . Rheumatoid arthritis(714.0)    Past Surgical History:  Procedure Laterality Date  . BREAST CYST EXCISION  12/31/2010   Procedure: CYST EXCISION BREAST;  Surgeon: Donato Heinz, MD;  Location: [facility];  Service: General;  Laterality: Right;  Excision Sebaceous Cyst Right Breast  . ENDOMETRIAL ABLATION    . TUBAL LIGATION     Social History   Social History  . Marital status: Single    Spouse name: N/A  . Number of children: N/A  . Years of education: N/A   Social History Main Topics  . Smoking status: Current Every Day Smoker    Packs/day: 0.50    Years: 30.00    Types: Cigarettes  . Smokeless tobacco: Never Used     Comment: has cut back, has not started Chantix  . Alcohol use No  . Drug use: No  . Sexual activity: No   Other Topics Concern  . None   Social History Narrative  . None   Outpatient Encounter Prescriptions as of 10/15/2015  Medication Sig  . alendronate (FOSAMAX) 70 MG tablet Take 70 mg by mouth once a week. Take with a full glass of water on an empty stomach.(Monday)  . folic acid (FOLVITE) 1 MG tablet Take 1 mg by mouth daily.  . IRON PO Take 1 tablet by mouth daily.  . methotrexate (RHEUMATREX) 2.5 MG tablet Take 15 mg by mouth once a week. Caution:Chemotherapy. Protect from light.(Thursday)  . Multiple Vitamin (MULTIVITAMIN WITH MINERALS) TABS Take 1 tablet by mouth daily. Reported on 05/02/2015  . naproxen (NAPROSYN) 500 MG tablet Take 500 mg by mouth 2 (two) times daily with a meal.  . omeprazole (PRILOSEC) 40 MG capsule Take 1 capsule (40 mg total) by mouth daily.  . varenicline (CHANTIX CONTINUING MONTH  PAK) 1 MG tablet Take 1 tablet (1 mg total) by mouth 2 (two) times daily. (Patient not taking: Reported on 10/15/2015)   No facility-administered encounter medications on file as of 10/15/2015.    ALLERGIES: Allergies  Allergen Reactions  . Voltaren [Diclofenac Sodium] Nausea And Vomiting  . Codeine Itching and Rash    Rash occurred when taking tylenol #3.   VACCINATION STATUS: Immunization History  Administered Date(s) Administered  . Tdap 07/02/2012    Thyroid Problem     [name] is a patient with above medical history. She is here to follow-up for history of subclinical hyperthyroidism With repeat thyroid function tests. -Since her last visit she says she was  treated for one week with tapering dose of steroids for treatment of rheumatoid arthritis.  - She has a steady weight since last visit. She was recently diagnosed with osteoporosis and started on Fosamax 70 mg weekly followed by rheumatology.   She denies any personal history of goiter nor family history of thyroid cancer.   -  she is on methotrexate ,  She is a chronic smoker.  She  Denies palpitations, heat intolerance/sweating. She still reports  anxiety.   Review of Systems Constitutional: -  weight loss , +fatigue, no subjective hyperthermia/hypothermia Eyes: no  blurry vision, no xerophthalmia ENT: no sore throat, no nodules palpated in throat, no dysphagia/odynophagia, no hoarseness Cardiovascular: no CP/SOB/palpitations/leg swelling Respiratory: no cough/SOB Gastrointestinal: no N/V/D/C Musculoskeletal: Diffuse muscle and joint pains on opioids and methotrexate. Skin: no rashes Neurological: - tremors Psychiatric: +anxiety  Objective:    BP (!) 142/90   Pulse 72   Ht '5\' 5"'$  (1.651 m)   Wt 139 lb (63 kg)   BMI 23.13 kg/m   Wt Readings from Last 3 Encounters:  10/15/15 139 lb (63 kg)  09/25/15 136 lb 6.4 oz (61.9 kg)  07/25/15 147 lb 12.8 oz (67 kg)    Physical Exam Constitutional: She is more  stable state of mind  in NAD. Eyes: PERRLA, EOMI, no exophthalmos ENT: moist mucous membranes, no thyromegaly, no cervical lymphadenopathy Cardiovascular:   Tachycardia resolved . Respiratory: Diffuse wheezes and rales. Gastrointestinal: abdomen soft, NT, ND, BS+ Musculoskeletal: no deformities, strength intact in all 4 Skin: moist, warm, no rashes Neurological: Mild tremors of outstretched hands,  DTR normal in all 4  Recent Results (from the past 2160 hour(s))  Potassium     Status: None   Collection Time: 07/24/15 12:59 PM  Result Value Ref Range   Potassium 4.2 3.5 - 5.3 mmol/L  Hemoglobin     Status: Abnormal   Collection Time: 07/24/15 12:59 PM  Result Value Ref Range   Hemoglobin 9.4 (L) 11.7 - 15.5 g/dL    Comment: ** Please note change in unit of measure and reference range(s). **  Hematocrit     Status: Abnormal   Collection Time: 07/24/15 12:59 PM  Result Value Ref Range   HCT 30.6 (L) 35.0 - 45.0 %    Comment: ** Please note change in unit of measure and reference range(s). **  COMPLETE METABOLIC PANEL WITH GFR     Status: Abnormal   Collection Time: 09/23/15  8:36 AM  Result Value Ref Range   Sodium 140 135 - 146 mmol/L   Potassium 4.0 3.5 - 5.3 mmol/L   Chloride 106 98 - 110 mmol/L   CO2 26 20 - 31 mmol/L   Glucose, Bld 106 (H) 65 - 99 mg/dL   BUN 13 7 - 25 mg/dL   Creat 0.75 0.50 - 1.10 mg/dL   Total Bilirubin 0.3 0.2 - 1.2 mg/dL   Alkaline Phosphatase 97 33 - 115 U/L   AST 15 10 - 35 U/L   ALT 9 6 - 29 U/L   Total Protein 6.7 6.1 - 8.1 g/dL   Albumin 3.7 3.6 - 5.1 g/dL   Calcium 9.5 8.6 - 10.2 mg/dL   GFR, Est African American >89 >=60 mL/min   GFR, Est Non African American >89 >=60 mL/min  CBC with Differential/Platelet     Status: Abnormal   Collection Time: 09/23/15  8:36 AM  Result Value Ref Range   WBC 5.5 3.8 - 10.8 K/uL   RBC 3.88 3.80 - 5.10 MIL/uL   Hemoglobin 9.6 (L) 11.7 - 15.5 g/dL   HCT 29.9 (L) 35.0 - 45.0 %   MCV 77.1 (L) 80.0 - 100.0  fL   MCH 24.7 (L) 27.0 - 33.0 pg   MCHC 32.1 32.0 - 36.0 g/dL   RDW 20.6 (H) 11.0 - 15.0 %   Platelets 515 (H) 140 - 400 K/uL   MPV 9.5 7.5 - 12.5 fL   Neutro Abs 3,025 1,500 - 7,800 cells/uL   Lymphs Abs 1,925 850 - 3,900 cells/uL   Monocytes Absolute 165 (L)  200 - 950 cells/uL   Eosinophils Absolute 385 15 - 500 cells/uL   Basophils Absolute 0 0 - 200 cells/uL   Neutrophils Relative % 55 %   Lymphocytes Relative 35 %   Monocytes Relative 3 %   Eosinophils Relative 7 %   Basophils Relative 0 %   Smear Review Criteria for review not met   Sedimentation rate     Status: Abnormal   Collection Time: 09/23/15  8:36 AM  Result Value Ref Range   Sed Rate 26 (H) 0 - 20 mm/hr  TSH     Status: None   Collection Time: 09/23/15  8:36 AM  Result Value Ref Range   TSH 1.90 mIU/L    Comment:   Reference Range   > or = 20 Years  0.40-4.50   Pregnancy Range First trimester  0.26-2.66 Second trimester 0.55-2.73 Third trimester  0.43-2.91     T4, free     Status: None   Collection Time: 09/23/15  8:36 AM  Result Value Ref Range   Free T4 1.1 0.8 - 1.8 ng/dL  T3, free     Status: None   Collection Time: 09/23/15  8:36 AM  Result Value Ref Range   T3, Free 3.4 2.3 - 4.2 pg/mL     Assessment & Plan:   1.Subclinical hyperthyroidism: - Her most recent thyroid function tests are within normal limits showing resolution of subclinical hyperthyroidism. -No gross goiter on physical exam hence no need for thyroid ultrasound for now. - She will not need anti-thyroid intervention at this time. Of note patient has taken prednisone  for  more than a year  until it was discontinued  Prior to her last visit, and then treated with tapering dose of prednisone for week since last visit.  She has had cushingoid appearance, which is now improving.     she has history  of chronic heavy smoking with risk of COPD. I have counseled her against smoking. -She is now on Fosamax for osteoporosis being  followed by rheumatology. - I advised patient to maintain close follow up with Soyla Dryer, PA-C for primary care needs. Follow up plan: Return in about 6 months (around 04/14/2016) for follow up with pre-visit labs.  Glade Lloyd, MD Phone: [phone]  Fax: [phone]   10/15/2015, 2:41 PM

## 2015-10-17 ENCOUNTER — Other Ambulatory Visit: Payer: Self-pay

## 2015-10-17 DIAGNOSIS — Z8639 Personal history of other endocrine, nutritional and metabolic disease: Secondary | ICD-10-CM

## 2015-10-17 DIAGNOSIS — D649 Anemia, unspecified: Secondary | ICD-10-CM

## 2015-10-21 LAB — COMPLETE METABOLIC PANEL WITH GFR
ALBUMIN: 4 g/dL (ref 3.6–5.1)
ALK PHOS: 94 U/L (ref 33–115)
ALT: 10 U/L (ref 6–29)
AST: 15 U/L (ref 10–35)
BUN: 15 mg/dL (ref 7–25)
CO2: 23 mmol/L (ref 20–31)
Calcium: 9.5 mg/dL (ref 8.6–10.2)
Chloride: 105 mmol/L (ref 98–110)
Creat: 0.56 mg/dL (ref 0.50–1.10)
GFR, Est African American: >89 mL/min (ref >=60)
GFR, Est Non African American: >89 mL/min (ref >=60)
GLUCOSE: 104 mg/dL — AB (ref 65–99)
POTASSIUM: 4.6 mmol/L (ref 3.5–5.3)
SODIUM: 140 mmol/L (ref 135–146)
Total Bilirubin: 0.2 mg/dL (ref 0.2–1.2)
Total Protein: 6.5 g/dL (ref 6.1–8.1)

## 2015-10-21 LAB — LIPID PANEL
CHOL/HDL RATIO: 6.5 ratio — AB (ref <=5.0)
Cholesterol: 215 mg/dL — ABNORMAL HIGH (ref 125–200)
HDL: 33 mg/dL — ABNORMAL LOW (ref >=46)
LDL CALC: 151 mg/dL — AB (ref <130)
Triglycerides: 155 mg/dL — ABNORMAL HIGH (ref <150)
VLDL: 31 mg/dL — AB (ref <30)

## 2015-10-21 LAB — HEMATOCRIT: HEMATOCRIT: 29.7 % — AB (ref 35.0–45.0)

## 2015-10-21 LAB — HEMOGLOBIN: Hemoglobin: 9.4 g/dL — ABNORMAL LOW (ref 11.7–15.5)

## 2015-10-24 ENCOUNTER — Encounter: Payer: Self-pay | Admitting: Physician Assistant

## 2015-10-24 ENCOUNTER — Ambulatory Visit: Payer: Self-pay | Admitting: Physician Assistant

## 2015-10-24 VITALS — BP 114/70 | HR 87 | Temp 98.1°F | Ht 65.0 in | Wt 139.5 lb

## 2015-10-24 DIAGNOSIS — M810 Age-related osteoporosis without current pathological fracture: Secondary | ICD-10-CM

## 2015-10-24 DIAGNOSIS — D649 Anemia, unspecified: Secondary | ICD-10-CM

## 2015-10-24 DIAGNOSIS — M069 Rheumatoid arthritis, unspecified: Secondary | ICD-10-CM

## 2015-10-24 DIAGNOSIS — F17210 Nicotine dependence, cigarettes, uncomplicated: Secondary | ICD-10-CM

## 2015-10-24 DIAGNOSIS — E785 Hyperlipidemia, unspecified: Secondary | ICD-10-CM

## 2015-10-24 MED ORDER — ATORVASTATIN CALCIUM 20 MG PO TABS: 20.0000 mg | ORAL_TABLET | Freq: Every day | ORAL | 2 refills | Status: DC

## 2015-10-24 NOTE — Progress Notes (Signed)
BP 114/70 (BP Location: Left Arm, Patient Position: Sitting, Cuff Size: Normal)   Pulse 87   Temp 98.1 F (36.7 C) (Other (Comment))   Ht 5\' 5"  (1.651 m)   Wt 139 lb 8 oz (63.3 kg)   SpO2 95%   BMI 23.21 kg/m    Subjective:    Patient ID: , female    DOB: 23-Aug-1965, 50 y.o.   MRN: [id number]  HPI: [name] is a 50 y.o. female presenting on 10/24/2015 for Anemia   HPI Pt unable to do pap today due to sample containers expired  Pt still going to [facility]ology at [facility]  She will be starting simponi soon.  Pt is feeling well.  She is enjoying not being on prednisone any more  She is still smoking but hopes to stop soon  ifobt test not received by office yet  Relevant past medical, surgical, family and social history reviewed and updated as indicated. Interim medical history since our last visit reviewed. Allergies and medications reviewed and updated.   Current Outpatient Prescriptions:  .  alendronate (FOSAMAX) 70 MG tablet, Take 70 mg by mouth once a week. Take with a full glass of water on an empty stomach.(Monday), Disp: , Rfl:  .  folic acid (FOLVITE) 1 MG tablet, Take 1 mg by mouth daily., Disp: , Rfl:  .  IRON PO, Take 1 tablet by mouth daily., Disp: , Rfl:  .  methotrexate (RHEUMATREX) 2.5 MG tablet, Take 20 mg by mouth once a week. Caution:Chemotherapy. Protect from light.(Thursday), Disp: , Rfl:  .  Multiple Vitamin (MULTIVITAMIN WITH MINERALS) TABS, Take 1 tablet by mouth daily. Reported on 05/02/2015, Disp: , Rfl:  .  naproxen (NAPROSYN) 500 MG tablet, Take 500 mg by mouth 2 (two) times daily with a meal., Disp: , Rfl:  .  omeprazole (PRILOSEC) 40 MG capsule, Take 1 capsule (40 mg total) by mouth daily., Disp: 90 capsule, Rfl: 1 .  varenicline (CHANTIX CONTINUING MONTH PAK) 1 MG tablet, Take 1 tablet (1 mg total) by mouth 2 (two) times daily. (Patient not taking: Reported on 10/24/2015), Disp: 180 tablet, Rfl: 1   Review of Systems   Constitutional: Positive for appetite change, diaphoresis, fatigue and unexpected weight change. Negative for chills and fever.  HENT: Negative for congestion, drooling, ear pain, facial swelling, hearing loss, mouth sores, sneezing, sore throat, trouble swallowing and voice change.   Eyes: Negative for pain, discharge, redness, itching and visual disturbance.  Respiratory: Positive for chest tightness and shortness of breath. Negative for cough, choking and wheezing.   Cardiovascular: Positive for leg swelling. Negative for chest pain and palpitations.  Gastrointestinal: Positive for constipation. Negative for abdominal pain, blood in stool, diarrhea and vomiting.  Endocrine: Positive for heat intolerance and polydipsia. Negative for cold intolerance.  Genitourinary: Negative for decreased urine volume, dysuria and hematuria.  Musculoskeletal: Positive for arthralgias. Negative for back pain and gait problem.  Skin: Negative for rash.  Allergic/Immunologic: Positive for environmental allergies.  Neurological: Positive for headaches. Negative for seizures, syncope and light-headedness.  Hematological: Negative for adenopathy.  Psychiatric/Behavioral: Positive for agitation and dysphoric mood. Negative for suicidal ideas. The patient is nervous/anxious.     Per HPI unless specifically indicated above     Objective:    BP 114/70 (BP Location: Left Arm, Patient Position: Sitting, Cuff Size: Normal)   Pulse 87   Temp 98.1 F (36.7 C) (Other (Comment))   Ht 5\' 5"  (1.651 m)  Wt 139 lb 8 oz (63.3 kg)   SpO2 95%   BMI 23.21 kg/m   Wt Readings from Last 3 Encounters:  10/24/15 139 lb 8 oz (63.3 kg)  10/15/15 139 lb (63 kg)  09/25/15 136 lb 6.4 oz (61.9 kg)    Physical Exam  Constitutional: She is oriented to person, place, and time. She appears well-developed and well-nourished.  HENT:  Head: Normocephalic and atraumatic.  Neck: Neck supple.  Cardiovascular: Normal rate and regular  rhythm.   Pulmonary/Chest: Effort normal and breath sounds normal.  Abdominal: Soft. Bowel sounds are normal. She exhibits no mass. There is no hepatosplenomegaly. There is no tenderness.  Musculoskeletal: She exhibits no edema.  Lymphadenopathy:    She has no cervical adenopathy.  Neurological: She is alert and oriented to person, place, and time.  Skin: Skin is warm and dry.  Psychiatric: She has a normal mood and affect. Her behavior is normal.  Vitals reviewed.   Results for orders placed or performed in visit on 10/17/15  Hemoglobin  Result Value Ref Range   Hemoglobin 9.4 (L) 11.7 - 15.5 g/dL  Hematocrit  Result Value Ref Range   HCT 29.7 (L) 35.0 - 45.0 %  COMPLETE METABOLIC PANEL WITH GFR  Result Value Ref Range   Sodium 140 135 - 146 mmol/L   Potassium 4.6 3.5 - 5.3 mmol/L   Chloride 105 98 - 110 mmol/L   CO2 23 20 - 31 mmol/L   Glucose, Bld 104 (H) 65 - 99 mg/dL   BUN 15 7 - 25 mg/dL   Creat 9.40 7.68 - 0.88 mg/dL   Total Bilirubin 0.2 0.2 - 1.2 mg/dL   Alkaline Phosphatase 94 33 - 115 U/L   AST 15 10 - 35 U/L   ALT 10 6 - 29 U/L   Total Protein 6.5 6.1 - 8.1 g/dL   Albumin 4.0 3.6 - 5.1 g/dL   Calcium 9.5 8.6 - 11.0 mg/dL   GFR, Est African American >89 >=60 mL/min   GFR, Est Non African American >89 >=60 mL/min  Lipid Profile  Result Value Ref Range   Cholesterol 215 (H) 125 - 200 mg/dL   Triglycerides 315 (H) <150 mg/dL   HDL 33 (L) >=94 mg/dL   Total CHOL/HDL Ratio 6.5 (H) <=5.0 Ratio   VLDL 31 (H) <30 mg/dL   LDL Cholesterol 585 (H) <130 mg/dL      Assessment & Plan:    Encounter Diagnoses  Name Primary?  . Rheumatoid arthritis involving multiple sites, unspecified rheumatoid factor presence ([facility]) Yes  . Hyperlipidemia, unspecified hyperlipidemia type   . Anemia, unspecified type   . Cigarette nicotine dependence, uncomplicated   . Osteoporosis, unspecified osteoporosis type, unspecified pathological fracture presence     -reviewed labs with  pt -H/h stable- continue iron -Lipids high- rx atorvastatin and lowfat diet -mammogram will be due in december -Will reschedule pap when new containers come in -will follow up for lipids, anemia in 3 months.  RTO sooner prn

## 2015-10-24 NOTE — Patient Instructions (Signed)
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
## 2016-02-12 ENCOUNTER — Other Ambulatory Visit: Payer: Self-pay

## 2016-02-12 DIAGNOSIS — E785 Hyperlipidemia, unspecified: Secondary | ICD-10-CM

## 2016-02-12 DIAGNOSIS — D649 Anemia, unspecified: Secondary | ICD-10-CM

## 2016-02-14 LAB — HEMOGLOBIN: Hemoglobin: 10.6 g/dL — ABNORMAL LOW (ref 11.7–15.5)

## 2016-02-14 LAB — HEMATOCRIT: HCT: 33.6 % — ABNORMAL LOW (ref 35.0–45.0)

## 2016-02-15 LAB — LIPID PANEL
CHOLESTEROL: 168 mg/dL (ref <200)
HDL: 44 mg/dL — ABNORMAL LOW (ref >50)
LDL CALC: 100 mg/dL — AB (ref <100)
Total CHOL/HDL Ratio: 3.8 Ratio (ref <5.0)
Triglycerides: 121 mg/dL (ref <150)
VLDL: 24 mg/dL (ref <30)

## 2016-02-15 LAB — COMPREHENSIVE METABOLIC PANEL
ALT: 11 U/L (ref 6–29)
AST: 15 U/L (ref 10–35)
Albumin: 4.4 g/dL (ref 3.6–5.1)
Alkaline Phosphatase: 85 U/L (ref 33–115)
BUN: 9 mg/dL (ref 7–25)
CHLORIDE: 107 mmol/L (ref 98–110)
CO2: 22 mmol/L (ref 20–31)
CREATININE: 0.63 mg/dL (ref 0.50–1.10)
Calcium: 9.4 mg/dL (ref 8.6–10.2)
Glucose, Bld: 101 mg/dL — ABNORMAL HIGH (ref 65–99)
Potassium: 3.9 mmol/L (ref 3.5–5.3)
SODIUM: 139 mmol/L (ref 135–146)
TOTAL PROTEIN: 7.2 g/dL (ref 6.1–8.1)
Total Bilirubin: 0.4 mg/dL (ref 0.2–1.2)

## 2016-02-18 ENCOUNTER — Encounter: Payer: Self-pay | Admitting: Physician Assistant

## 2016-02-18 ENCOUNTER — Ambulatory Visit: Payer: Self-pay | Admitting: Physician Assistant

## 2016-02-18 VITALS — BP 124/90 | HR 96 | Temp 98.1°F | Ht 65.0 in | Wt 142.0 lb

## 2016-02-18 DIAGNOSIS — E785 Hyperlipidemia, unspecified: Secondary | ICD-10-CM

## 2016-02-18 DIAGNOSIS — F17210 Nicotine dependence, cigarettes, uncomplicated: Secondary | ICD-10-CM

## 2016-02-18 DIAGNOSIS — D649 Anemia, unspecified: Secondary | ICD-10-CM

## 2016-02-18 DIAGNOSIS — M810 Age-related osteoporosis without current pathological fracture: Secondary | ICD-10-CM

## 2016-02-18 DIAGNOSIS — M069 Rheumatoid arthritis, unspecified: Secondary | ICD-10-CM

## 2016-02-18 DIAGNOSIS — Z1239 Encounter for other screening for malignant neoplasm of breast: Secondary | ICD-10-CM

## 2016-02-18 DIAGNOSIS — K219 Gastro-esophageal reflux disease without esophagitis: Secondary | ICD-10-CM

## 2016-02-18 MED ORDER — OMEPRAZOLE 40 MG PO CPDR: 40.0000 mg | DELAYED_RELEASE_CAPSULE | Freq: Every day | ORAL | 3 refills | Status: DC

## 2016-02-18 MED ORDER — ATORVASTATIN CALCIUM 20 MG PO TABS: 20.0000 mg | ORAL_TABLET | Freq: Every day | ORAL | 3 refills | Status: DC

## 2016-02-18 NOTE — Progress Notes (Signed)
BP 124/90 (BP Location: Left Arm, Patient Position: Sitting, Cuff Size: Normal)   Pulse 96   Temp 98.1 F (36.7 C) (Other (Comment))   Ht 5\' 5"  (1.651 m)   Wt 142 lb (64.4 kg)   SpO2 97%   BMI 23.63 kg/m    Subjective:    Patient ID: , female    DOB: 1965/06/03, 51 y.o.   MRN: [id number]  HPI: [name] is a 51 y.o. female presenting on 02/18/2016 for Hyperlipidemia   HPI   Pt Doing well.  She is still smoking.  She feels better with new RA infusion  Relevant past medical, surgical, family and social history reviewed and updated as indicated. Interim medical history since our last visit reviewed. Allergies and medications reviewed and updated.   Current Outpatient Prescriptions:  .  alendronate (FOSAMAX) 70 MG tablet, Take 70 mg by mouth once a week. Take with a full glass of water on an empty stomach.(Monday), Disp: , Rfl:  .  atorvastatin (LIPITOR) 20 MG tablet, Take 1 tablet (20 mg total) by mouth daily., Disp: 90 tablet, Rfl: 2 .  folic acid (FOLVITE) 1 MG tablet, Take 1 mg by mouth daily., Disp: , Rfl:  .  golimumab 2 mg/kg in sodium chloride 0.9 %, Inject into the vein once., Disp: , Rfl:  .  IRON PO, Take 1 tablet by mouth daily., Disp: , Rfl:  .  methotrexate (RHEUMATREX) 2.5 MG tablet, Take 20 mg by mouth once a week. Caution:Chemotherapy. Protect from light.(Thursday), Disp: , Rfl:  .  Multiple Vitamin (MULTIVITAMIN WITH MINERALS) TABS, Take 1 tablet by mouth daily. Reported on 05/02/2015, Disp: , Rfl:  .  naproxen (NAPROSYN) 500 MG tablet, Take 500 mg by mouth 2 (two) times daily with a meal., Disp: , Rfl:  .  omeprazole (PRILOSEC) 40 MG capsule, Take 1 capsule (40 mg total) by mouth daily., Disp: 90 capsule, Rfl: 1   Review of Systems  Constitutional: Positive for appetite change and fatigue. Negative for chills, diaphoresis, fever and unexpected weight change.  HENT: Positive for sneezing. Negative for congestion, dental problem, drooling, ear  pain, facial swelling, hearing loss, mouth sores, sore throat, trouble swallowing and voice change.   Eyes: Negative for pain, discharge, redness, itching and visual disturbance.  Respiratory: Positive for shortness of breath and wheezing. Negative for cough and choking.   Cardiovascular: Negative for chest pain, palpitations and leg swelling.  Gastrointestinal: Negative for abdominal pain, blood in stool, constipation, diarrhea and vomiting.  Endocrine: Positive for polydipsia. Negative for cold intolerance and heat intolerance.  Genitourinary: Negative for decreased urine volume, dysuria and hematuria.  Musculoskeletal: Positive for arthralgias and gait problem. Negative for back pain.  Skin: Negative for rash.  Allergic/Immunologic: Negative for environmental allergies.  Neurological: Positive for headaches. Negative for seizures, syncope and light-headedness.  Hematological: Negative for adenopathy.  Psychiatric/Behavioral: Positive for agitation and dysphoric mood. Negative for suicidal ideas. The patient is nervous/anxious.     Per HPI unless specifically indicated above     Objective:    BP 124/90 (BP Location: Left Arm, Patient Position: Sitting, Cuff Size: Normal)   Pulse 96   Temp 98.1 F (36.7 C) (Other (Comment))   Ht 5\' 5"  (1.651 m)   Wt 142 lb (64.4 kg)   SpO2 97%   BMI 23.63 kg/m   Wt Readings from Last 3 Encounters:  02/18/16 142 lb (64.4 kg)  10/24/15 139 lb 8 oz (63.3 kg)  10/15/15 139  lb (63 kg)    Physical Exam  Constitutional: She is oriented to person, place, and time. She appears well-developed and well-nourished.  HENT:  Head: Normocephalic and atraumatic.  Neck: Neck supple.  Cardiovascular: Normal rate and regular rhythm.   Pulmonary/Chest: Effort normal and breath sounds normal.  Abdominal: Soft. Bowel sounds are normal. She exhibits no mass. There is no hepatosplenomegaly. There is no tenderness.  Musculoskeletal: She exhibits no edema.   Lymphadenopathy:    She has no cervical adenopathy.  Neurological: She is alert and oriented to person, place, and time.  Skin: Skin is warm and dry.  Psychiatric: She has a normal mood and affect. Her behavior is normal.  Vitals reviewed.   Results for orders placed or performed in visit on 02/12/16  Hemoglobin  Result Value Ref Range   Hemoglobin 10.6 (L) 11.7 - 15.5 g/dL  Hematocrit  Result Value Ref Range   HCT 33.6 (L) 35.0 - 45.0 %  Comprehensive Metabolic Panel (CMET)  Result Value Ref Range   Sodium 139 135 - 146 mmol/L   Potassium 3.9 3.5 - 5.3 mmol/L   Chloride 107 98 - 110 mmol/L   CO2 22 20 - 31 mmol/L   Glucose, Bld 101 (H) 65 - 99 mg/dL   BUN 9 7 - 25 mg/dL   Creat 6.73 4.19 - 3.79 mg/dL   Total Bilirubin 0.4 0.2 - 1.2 mg/dL   Alkaline Phosphatase 85 33 - 115 U/L   AST 15 10 - 35 U/L   ALT 11 6 - 29 U/L   Total Protein 7.2 6.1 - 8.1 g/dL   Albumin 4.4 3.6 - 5.1 g/dL   Calcium 9.4 8.6 - 02.4 mg/dL  Lipid Profile  Result Value Ref Range   Cholesterol 168 <200 mg/dL   Triglycerides 097 <353 mg/dL   HDL 44 (L) >29 mg/dL   Total CHOL/HDL Ratio 3.8 <5.0 Ratio   VLDL 24 <30 mg/dL   LDL Cholesterol 924 (H) <100 mg/dL      Assessment & Plan:   Encounter Diagnoses  Name Primary?  . Hyperlipidemia, unspecified hyperlipidemia type Yes  . Anemia, unspecified type   . Cigarette nicotine dependence, uncomplicated   . Screening for breast cancer   . Gastroesophageal reflux disease, esophagitis presence not specified   . Rheumatoid arthritis involving multiple sites, unspecified rheumatoid factor presence (HCC)   . Osteoporosis, unspecified osteoporosis type, unspecified pathological fracture presence     -reviewed labs with pt -Continue current rx -order screening Mammogram -counseled smoking cessation -pt to [facility] for Pap next week

## 2016-02-24 ENCOUNTER — Ambulatory Visit: Payer: Self-pay | Admitting: Physician Assistant

## 2016-02-26 ENCOUNTER — Ambulatory Visit (HOSPITAL_COMMUNITY)
Admission: RE | Admit: 2016-02-26 | Discharge: 2016-02-26 | Disposition: A | Discharge status: Home / Self Care | Payer: Self-pay | Source: Ambulatory Visit | Attending: Physician Assistant | Admitting: Physician Assistant

## 2016-02-26 ENCOUNTER — Encounter (HOSPITAL_COMMUNITY): Payer: Self-pay | Admitting: Emergency Medicine

## 2016-02-26 ENCOUNTER — Ambulatory Visit: Payer: Self-pay | Admitting: Physician Assistant

## 2016-02-26 ENCOUNTER — Encounter: Payer: Self-pay | Admitting: Physician Assistant

## 2016-02-26 ENCOUNTER — Emergency Department (HOSPITAL_COMMUNITY)
Admission: EM | Admit: 2016-02-26 | Discharge: 2016-02-26 | Disposition: A | Discharge status: Home / Self Care | Payer: Self-pay | Attending: Emergency Medicine | Admitting: Emergency Medicine

## 2016-02-26 VITALS — BP 132/80 | HR 96 | Temp 97.9°F | Ht 65.0 in | Wt 148.0 lb

## 2016-02-26 DIAGNOSIS — Z124 Encounter for screening for malignant neoplasm of cervix: Secondary | ICD-10-CM

## 2016-02-26 DIAGNOSIS — M25562 Pain in left knee: Secondary | ICD-10-CM

## 2016-02-26 DIAGNOSIS — Y929 Unspecified place or not applicable: Secondary | ICD-10-CM | POA: Insufficient documentation

## 2016-02-26 DIAGNOSIS — W19XXXA Unspecified fall, initial encounter: Secondary | ICD-10-CM

## 2016-02-26 DIAGNOSIS — M810 Age-related osteoporosis without current pathological fracture: Secondary | ICD-10-CM

## 2016-02-26 DIAGNOSIS — E785 Hyperlipidemia, unspecified: Secondary | ICD-10-CM

## 2016-02-26 DIAGNOSIS — D649 Anemia, unspecified: Secondary | ICD-10-CM

## 2016-02-26 DIAGNOSIS — J449 Chronic obstructive pulmonary disease, unspecified: Secondary | ICD-10-CM | POA: Insufficient documentation

## 2016-02-26 DIAGNOSIS — Y999 Unspecified external cause status: Secondary | ICD-10-CM | POA: Insufficient documentation

## 2016-02-26 DIAGNOSIS — Y939 Activity, unspecified: Secondary | ICD-10-CM | POA: Insufficient documentation

## 2016-02-26 DIAGNOSIS — Z1231 Encounter for screening mammogram for malignant neoplasm of breast: Secondary | ICD-10-CM | POA: Insufficient documentation

## 2016-02-26 DIAGNOSIS — W010XXA Fall on same level from slipping, tripping and stumbling without subsequent striking against object, initial encounter: Secondary | ICD-10-CM | POA: Insufficient documentation

## 2016-02-26 DIAGNOSIS — I10 Essential (primary) hypertension: Secondary | ICD-10-CM | POA: Insufficient documentation

## 2016-02-26 DIAGNOSIS — F17210 Nicotine dependence, cigarettes, uncomplicated: Secondary | ICD-10-CM | POA: Insufficient documentation

## 2016-02-26 DIAGNOSIS — Z79899 Other long term (current) drug therapy: Secondary | ICD-10-CM | POA: Insufficient documentation

## 2016-02-26 MED ORDER — KETOROLAC TROMETHAMINE 60 MG/2ML IM SOLN
60.0000 mg | Freq: Once | INTRAMUSCULAR | Status: AC
  Administered 2016-02-26: 60 mg via INTRAMUSCULAR
  Filled 2016-02-26: qty 2

## 2016-02-26 NOTE — Discharge Instructions (Signed)
X-ray shows no fracture. Ice, elevate

## 2016-02-26 NOTE — ED Triage Notes (Signed)
Pt reports slipping up steps this morning and has left knee pain that was xrayed by [facility].  Pt denies head trauma or LOC.  Pt also c/o left wrist pain.  Pt alert and oriented.

## 2016-02-26 NOTE — Progress Notes (Signed)
BP 132/80 (BP Location: Left Arm, Patient Position: Sitting, Cuff Size: Normal)   Pulse 96   Temp 97.9 F (36.6 C) (Other (Comment))   Ht 5\' 5"  (1.651 m)   Wt 148 lb (67.1 kg)   SpO2 96%   BMI 24.63 kg/m    Subjective:    Patient ID: , female    DOB: 07-08-1965, 51 y.o.   MRN: [id number]  HPI: [name] is a 51 y.o. female presenting on 02/26/2016 for Gynecologic Exam   HPI   Pt fell on her left knee this morning.  She says it hurts  Relevant past medical, surgical, family and social history reviewed and updated as indicated. Interim medical history since our last visit reviewed. Allergies and medications reviewed and updated.   Current Outpatient Prescriptions:  .  alendronate (FOSAMAX) 70 MG tablet, Take 70 mg by mouth once a week. Take with a full glass of water on an empty stomach.(Monday), Disp: , Rfl:  .  atorvastatin (LIPITOR) 20 MG tablet, Take 1 tablet (20 mg total) by mouth daily., Disp: 90 tablet, Rfl: 3 .  folic acid (FOLVITE) 1 MG tablet, Take 1 mg by mouth daily., Disp: , Rfl:  .  golimumab 2 mg/kg in sodium chloride 0.9 %, Inject into the vein once., Disp: , Rfl:  .  IRON PO, Take 1 tablet by mouth daily., Disp: , Rfl:  .  methotrexate (RHEUMATREX) 2.5 MG tablet, Take 20 mg by mouth once a week. Caution:Chemotherapy. Protect from light.(Thursday), Disp: , Rfl:  .  Multiple Vitamin (MULTIVITAMIN WITH MINERALS) TABS, Take 1 tablet by mouth daily. Reported on 05/02/2015, Disp: , Rfl:  .  naproxen (NAPROSYN) 500 MG tablet, Take 500 mg by mouth 2 (two) times daily with a meal., Disp: , Rfl:  .  omeprazole (PRILOSEC) 40 MG capsule, Take 1 capsule (40 mg total) by mouth daily., Disp: 90 capsule, Rfl: 3   Review of Systems  Constitutional: Positive for appetite change, fatigue and unexpected weight change. Negative for chills, diaphoresis and fever.  HENT: Negative for congestion, drooling, ear pain, facial swelling, hearing loss, mouth sores, sneezing,  sore throat, trouble swallowing and voice change.   Eyes: Negative for pain, discharge, redness, itching and visual disturbance.  Respiratory: Positive for shortness of breath and wheezing. Negative for cough and choking.   Cardiovascular: Positive for leg swelling. Negative for chest pain and palpitations.  Gastrointestinal: Negative for abdominal pain, blood in stool, constipation, diarrhea and vomiting.  Endocrine: Positive for polydipsia. Negative for cold intolerance and heat intolerance.  Genitourinary: Negative for decreased urine volume, dysuria and hematuria.  Musculoskeletal: Positive for arthralgias. Negative for back pain and gait problem.  Skin: Negative for rash.  Allergic/Immunologic: Negative for environmental allergies.  Neurological: Positive for headaches. Negative for seizures, syncope and light-headedness.  Hematological: Negative for adenopathy.  Psychiatric/Behavioral: Positive for agitation and dysphoric mood. Negative for suicidal ideas. The patient is nervous/anxious.     Per HPI unless specifically indicated above     Objective:    BP 132/80 (BP Location: Left Arm, Patient Position: Sitting, Cuff Size: Normal)   Pulse 96   Temp 97.9 F (36.6 C) (Other (Comment))   Ht 5\' 5"  (1.651 m)   Wt 148 lb (67.1 kg)   SpO2 96%   BMI 24.63 kg/m   Wt Readings from Last 3 Encounters:  02/26/16 148 lb (67.1 kg)  02/18/16 142 lb (64.4 kg)  10/24/15 139 lb 8 oz (63.3 kg)  Physical Exam  Constitutional: She is oriented to person, place, and time. She appears well-developed and well-nourished.  Pulmonary/Chest: Effort normal.  Breast exam normal  Abdominal: Soft. She exhibits no mass. There is no tenderness. There is no rebound and no guarding.  Genitourinary: Vagina normal and uterus normal. No breast swelling, tenderness, discharge or bleeding. There is no rash, tenderness or lesion on the right labia. There is no rash, tenderness or lesion on the left labia. Cervix  exhibits no motion tenderness, no discharge and no friability. Right adnexum displays no mass, no tenderness and no fullness. Left adnexum displays no mass, no tenderness and no fullness.  Genitourinary Comments: (nurse Lupita Leash assisted)  Musculoskeletal:       Left knee: She exhibits swelling and deformity. She exhibits normal range of motion, no effusion, no ecchymosis, no erythema, no LCL laxity and no MCL laxity. Tenderness found.  Neurological: She is alert and oriented to person, place, and time.  Skin: Skin is warm and dry.  Psychiatric: She has a normal mood and affect. Her behavior is normal.  Nursing note and vitals reviewed.       Assessment & Plan:   Encounter Diagnoses  Name Primary?  . Routine Papanicolaou smear Yes  . Acute pain of left knee   . Fall, initial encounter   . Osteoporosis, unspecified osteoporosis type, unspecified pathological fracture presence   . Hyperlipidemia, unspecified hyperlipidemia type   . Anemia, unspecified type       -Pt will get xray of the knee in light of her fall and her osteopenia. -pt says she has [facility] -follow up 3 months.  RTO sooner prn

## 2016-02-26 NOTE — ED Provider Notes (Signed)
[facility]-EMER[facility]ENCY DEPT Provider Note   CSN: [id number] Arrival date & time: 02/26/16  1244     History   Chief Complaint Chief Complaint  Patient presents with  . Fall    HPI [name] is a 51 y.o. female.  Accidental slip and fall this morning striking left knee. Patient also complains of left wrist pain, but she has full range of motion.   No head or neck trauma. Severity of pain is mild. She is ambulatory.      Past Medical History:  Diagnosis Date  . Arthritis, rheumatoid (HCC)   . Chronic anemia   . Chronic pain   . COPD (chronic obstructive pulmonary disease) (HCC)   . Depression   . Hypertension   . Incidental lung nodule   . Osteoporosis   . Rheumatoid arthritis(714.0)     Patient Active Problem List   Diagnosis Date Noted  . Hypokalemia 05/05/2015  . Absolute anemia 05/05/2015  . Anxiety disorder 05/05/2015  . Fracture, cuboid 03/05/2015  . Pathological fracture of pelvis 03/05/2015  . Pathological fracture of pelvis due to osteoporosis (HCC) 02/20/2015  . Osteoporosis 02/20/2015  . Chronic pain 02/04/2015  . Esophageal reflux 02/04/2015  . Subclinical hyperthyroidism 02/04/2015  . Gastritis 01/07/2015  . Rheumatoid arthritis (HCC) 11/19/2014  . Cigarette nicotine dependence, uncomplicated 11/14/2014  . Hyperlipidemia 11/14/2014    Past Surgical History:  Procedure Laterality Date  . BREAST CYST EXCISION  12/31/2010   Procedure: CYST EXCISION BREAST;  Surgeon: Fabio Bering, MD;  Location: [facility];  Service: General;  Laterality: Right;  Excision Sebaceous Cyst Right Breast  . ENDOMETRIAL ABLATION    . TUBAL LIGATION      OB History    Gravida Para Term Preterm AB Living   6 5 5   1 5    SAB TAB Ectopic Multiple Live Births   1               Home Medications    Prior to Admission medications   Medication Sig Start Date End Date Taking? Authorizing Provider  alendronate (FOSAMAX) 70 MG tablet Take 70 mg by mouth once a week. Take  with a full glass of water on an empty stomach.(Monday)    Historical Provider, MD  atorvastatin (LIPITOR) 20 MG tablet Take 1 tablet (20 mg total) by mouth daily. 02/18/16   04/17/16, PA-C  folic acid (FOLVITE) 1 MG tablet Take 1 mg by mouth daily.    Historical Provider, MD  golimumab 2 mg/kg in sodium chloride 0.9 % Inject into the vein once.    Historical Provider, MD  IRON PO Take 1 tablet by mouth daily.    Historical Provider, MD  methotrexate (RHEUMATREX) 2.5 MG tablet Take 20 mg by mouth once a week. Caution:Chemotherapy. Protect from light.(Thursday)    Historical Provider, MD  Multiple Vitamin (MULTIVITAMIN WITH MINERALS) TABS Take 1 tablet by mouth daily. Reported on 05/02/2015    Historical Provider, MD  naproxen (NAPROSYN) 500 MG tablet Take 500 mg by mouth 2 (two) times daily with a meal.    Historical Provider, MD  omeprazole (PRILOSEC) 40 MG capsule Take 1 capsule (40 mg total) by mouth daily. 02/18/16   04/17/16, PA-C    Family History Family History  Problem Relation Age of Onset  . Cancer Father     lung  . Hypertension Mother   . Anesthesia problems Neg Hx     Social History Social History  Substance Use Topics  .  Smoking status: Current Every Day Smoker    Packs/day: 0.50    Years: 30.00    Types: Cigarettes  . Smokeless tobacco: Never Used     Comment: has cut back, has not started Chantix  . Alcohol use No     Allergies   Voltaren [diclofenac sodium] and Codeine   Review of Systems Review of Systems  All other systems reviewed and are negative.    Physical Exam Updated Vital Signs BP 133/80   Pulse 87   Temp 99 F (37.2 C) (Oral)   Resp 18   Ht 5\' 5"  (1.651 m)   Wt 148 lb (67.1 kg)   SpO2 99%   BMI 24.63 kg/m   Physical Exam  Constitutional: She is oriented to person, place, and time. She appears well-developed and well-nourished.  HENT:  Head: Normocephalic and atraumatic.  Eyes: Conjunctivae are normal.  Neck: Neck  supple.  Cardiovascular: Normal rate and regular rhythm.   Pulmonary/Chest: Effort normal and breath sounds normal.  Abdominal: Soft. Bowel sounds are normal.  Musculoskeletal:  Left wrist: Minimal medial lateral tenderness. Full range of motion. Left knee:  Most tender in superior lateral aspect of knee. Full range of motion.  Neurological: She is alert and oriented to person, place, and time.  Skin: Skin is warm and dry.  Psychiatric: She has a normal mood and affect. Her behavior is normal.  Nursing note and vitals reviewed.    ED Treatments / Results  Labs (all labs ordered are listed, but only abnormal results are displayed) Labs Reviewed - No data to display  EKG  EKG Interpretation None       Radiology Dg Knee Complete 4 Views Left  Result Date: 02/26/2016 CLINICAL DATA:  Pain following fall EXAM: LEFT KNEE - COMPLETE 4+ VIEW COMPARISON:  June 19, 2014 FINDINGS: Frontal, lateral, and bilateral oblique views were obtained. There is no fracture or dislocation. No joint effusion. There is stable joint space narrowing medially. Joint spaces elsewhere appear unremarkable. No erosive change. IMPRESSION: Stable narrowing medially.  No fracture or joint effusion. Electronically Signed   By: June 21, 2014 III M.D.   On: 02/26/2016 13:53   Ms Digital Screening Tomo Bilateral  Result Date: 02/26/2016 CLINICAL DATA:  Screening. EXAM: 2D DIGITAL SCREENING BILATERAL MAMMOGRAM WITH CAD AND ADJUNCT TOMO COMPARISON:  Previous exam(s). ACR Breast Density Category b: There are scattered areas of fibroglandular density. FINDINGS: There are no findings suspicious for malignancy. Images were processed with CAD. IMPRESSION: No mammographic evidence of malignancy. A result letter of this screening mammogram will be mailed directly to the patient. RECOMMENDATION: Screening mammogram in one year. (Code:SM-B-01Y) BI-RADS CATEGORY  1: Negative. Electronically Signed   By: 02/28/2016 M.D.   On:  02/26/2016 14:52    Procedures Procedures (including critical care time)  Medications Ordered in ED Medications  ketorolac (TORADOL) injection 60 mg (60 mg Intramuscular Given 02/26/16 1458)     Initial Impression / Assessment and Plan / ED Course  I have reviewed the triage vital signs and the nursing notes.  Pertinent labs & imaging results that were available during my care of the patient were reviewed by me and considered in my medical decision making (see chart for details).     Plain films of left knee negative for fracture. No head or neck trauma. Discussed with patient.  Final Clinical Impressions(s) / ED Diagnoses   Final diagnoses:  Fall, initial encounter  Acute pain of left knee  New Prescriptions New Prescriptions   No medications on file     Donnetta Hutching, MD 02/27/16 [phone]

## 2016-03-14 IMAGING — DX DG KNEE COMPLETE 4+V*L*
4 series · 4 of 4 positions shown · non-contrast
Comparison: None.

CLINICAL DATA: Left knee pain and swelling. Fell while getting off
couch today

EXAM:
LEFT KNEE - COMPLETE 4+ VIEW

[knee ap]
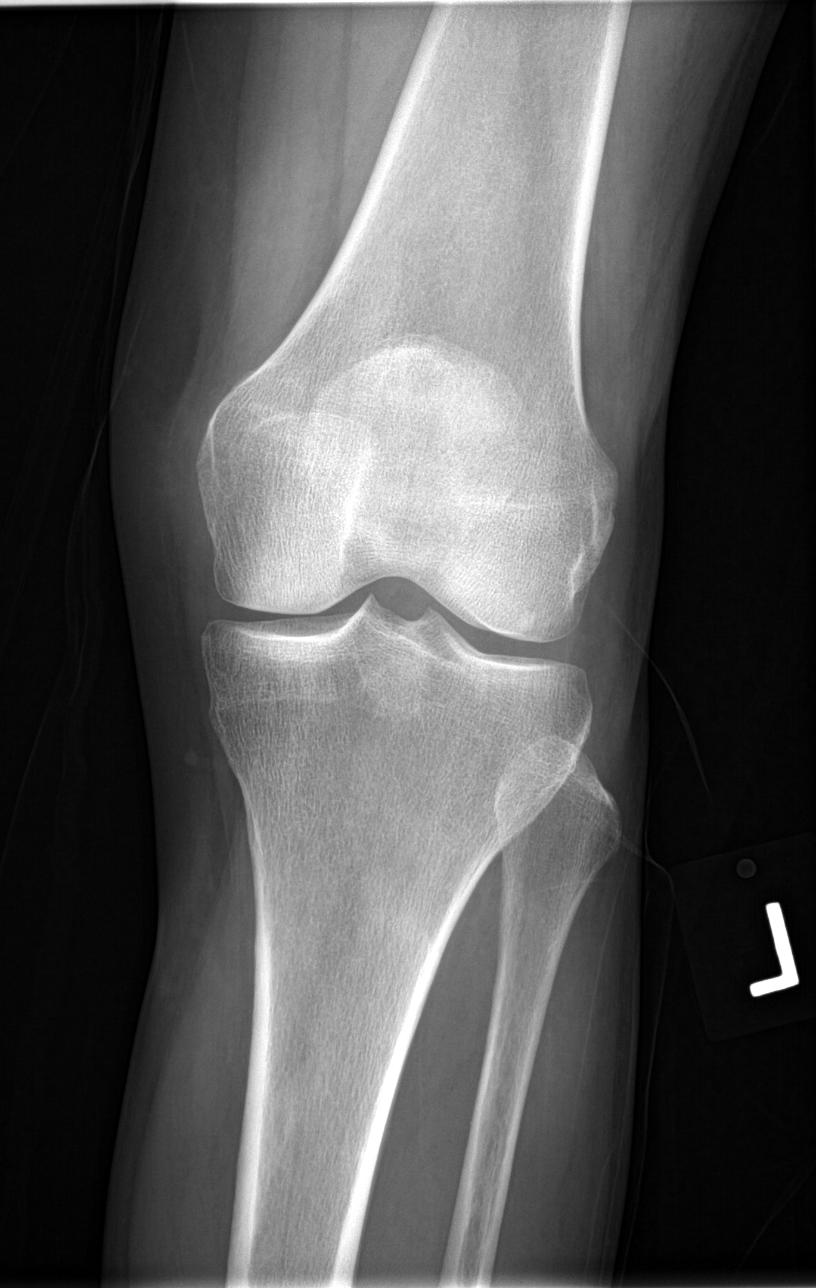

[knee obl (1 of 2)]
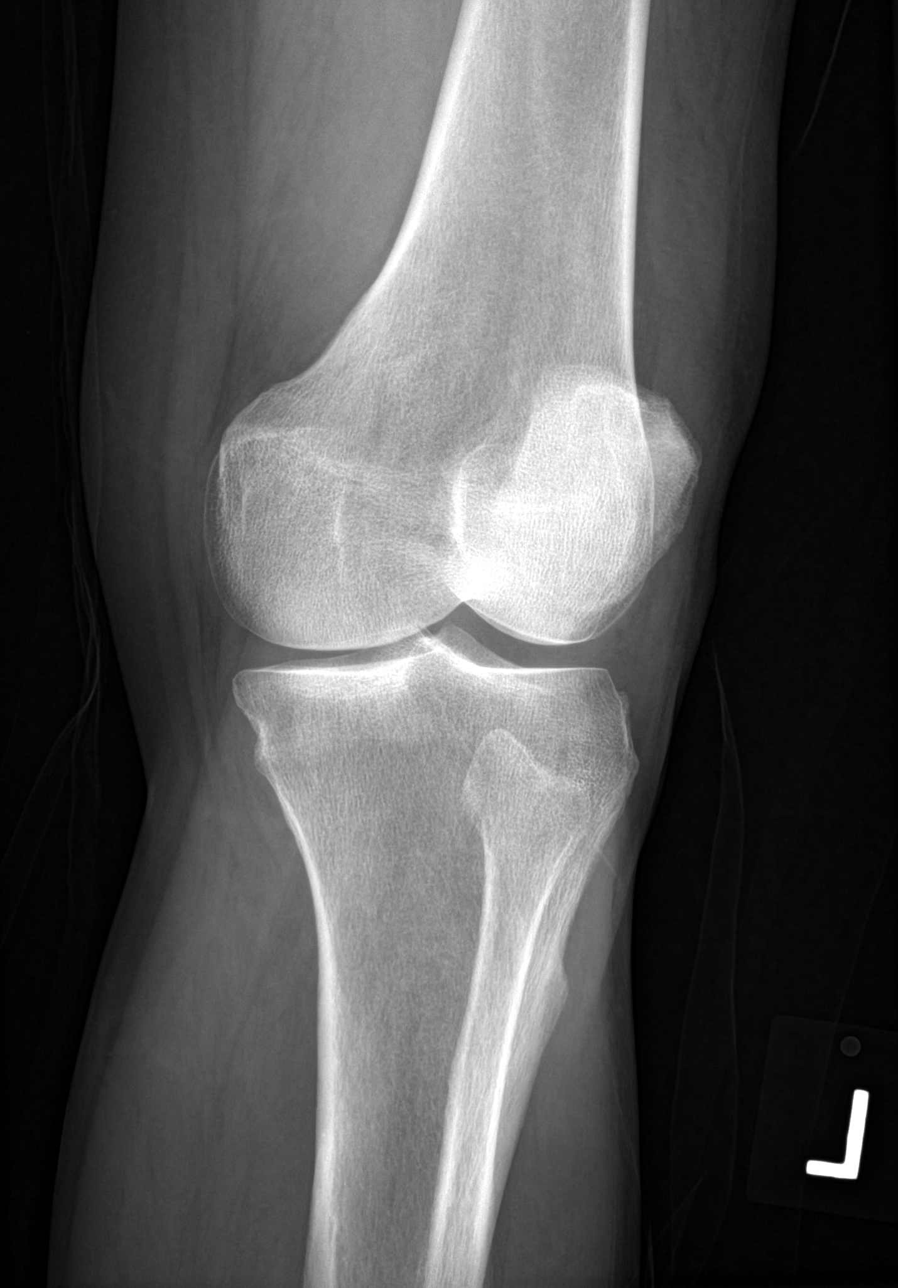

[knee obl (2 of 2)]
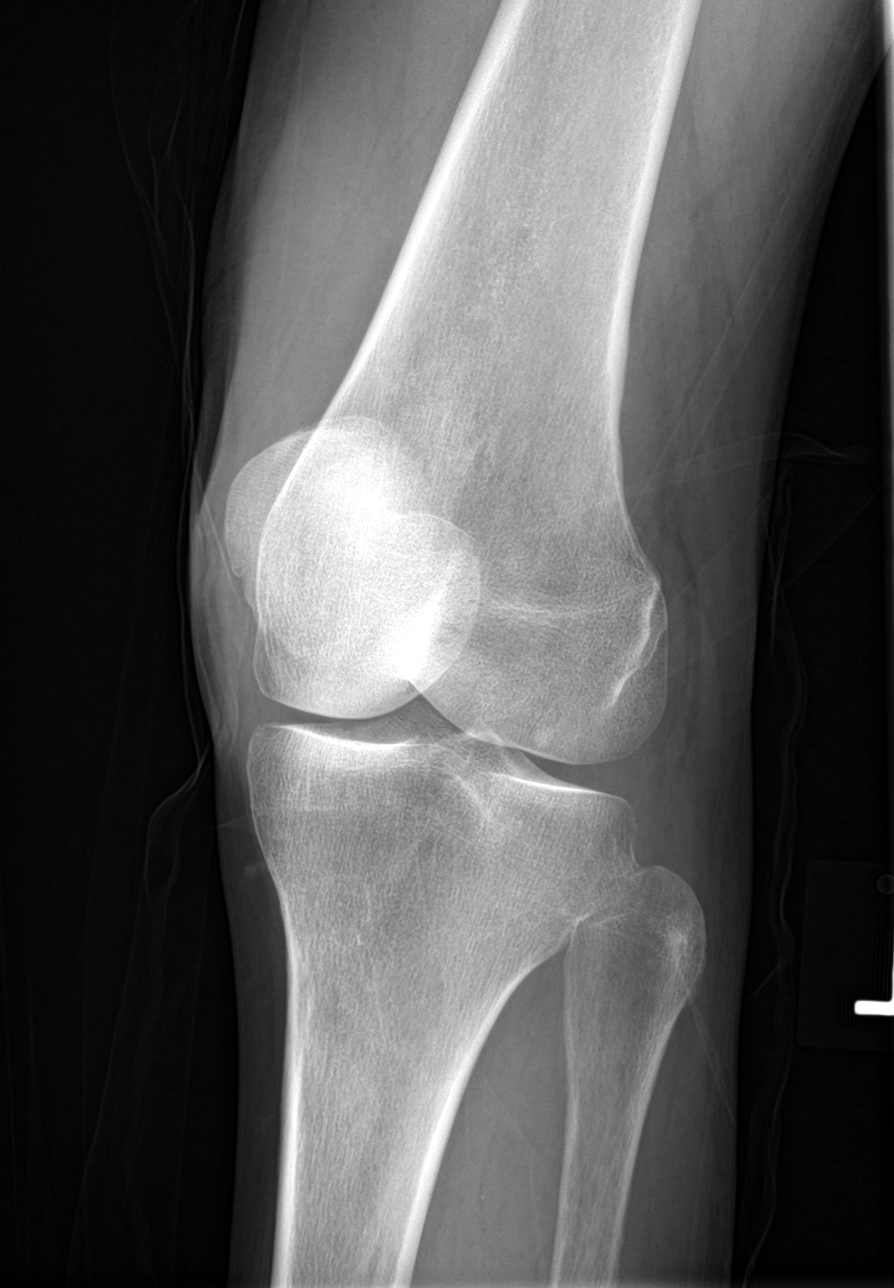

[knee lat]
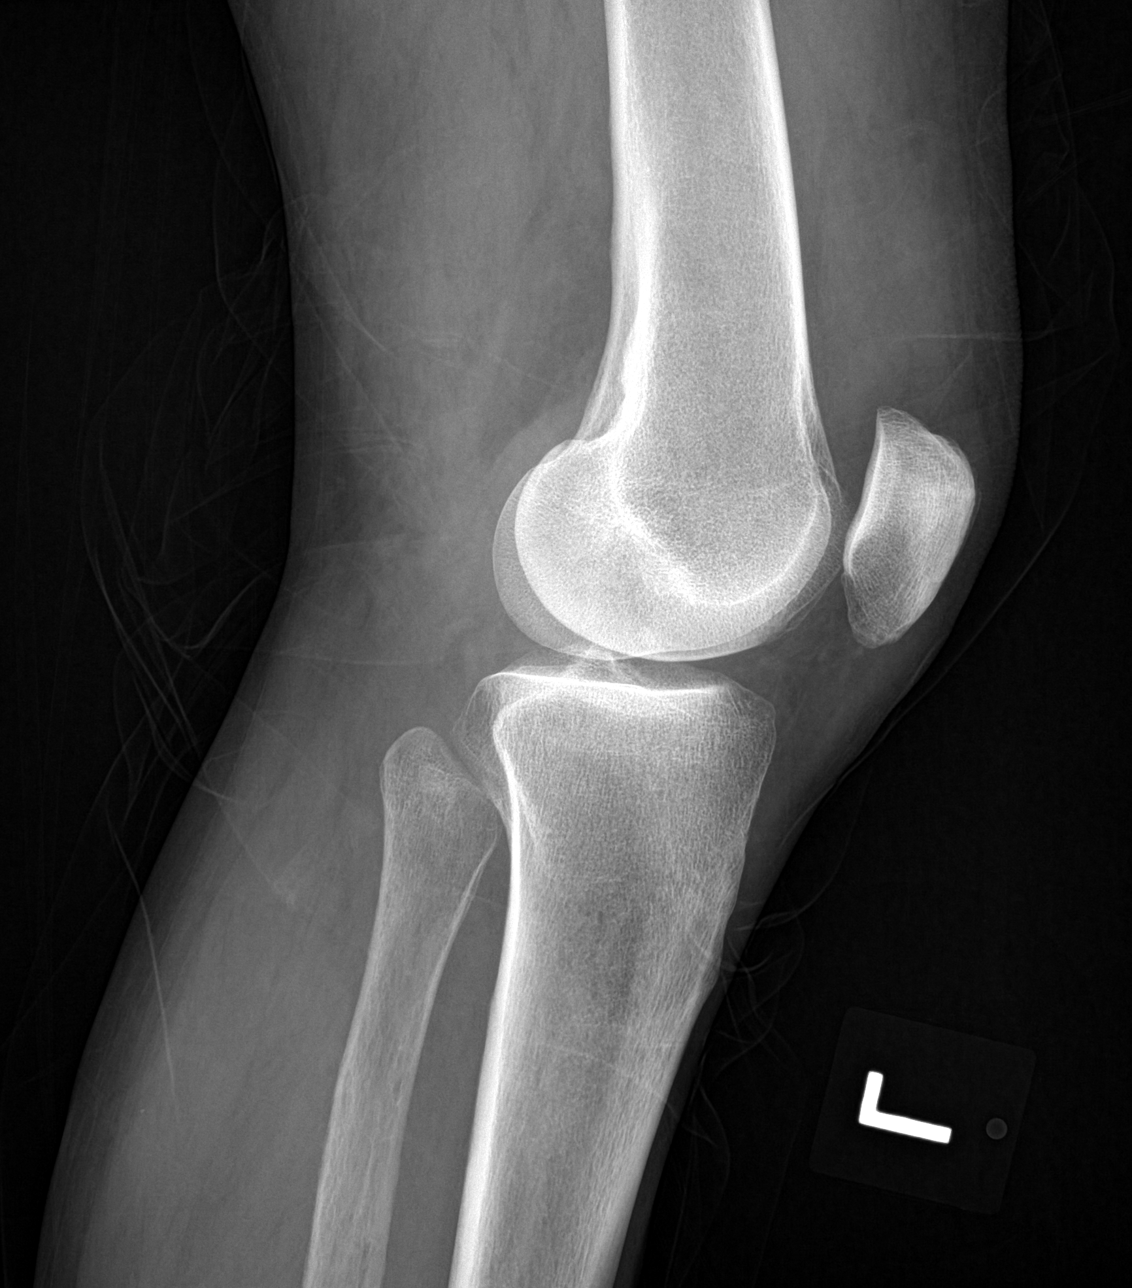

[4 of 4 positions shown; findings below may reference images not displayed]

FINDINGS: There is no evidence of fracture, dislocation, or joint effusion.
There is no evidence of arthropathy or other focal bone abnormality.
Soft tissues are unremarkable.
IMPRESSION: Negative.

## 2016-03-14 IMAGING — US US EXTREM LOW VENOUS*R*
1 series · 13 of 24 positions shown · non-contrast
Comparison: None.

CLINICAL DATA: Right calf pain and swelling. Recent fall 2 days
ago.



[Series 1: us extrem low venous*right* · 0.06mm/px · 13 of 51 slices shown]
[im 1/51]
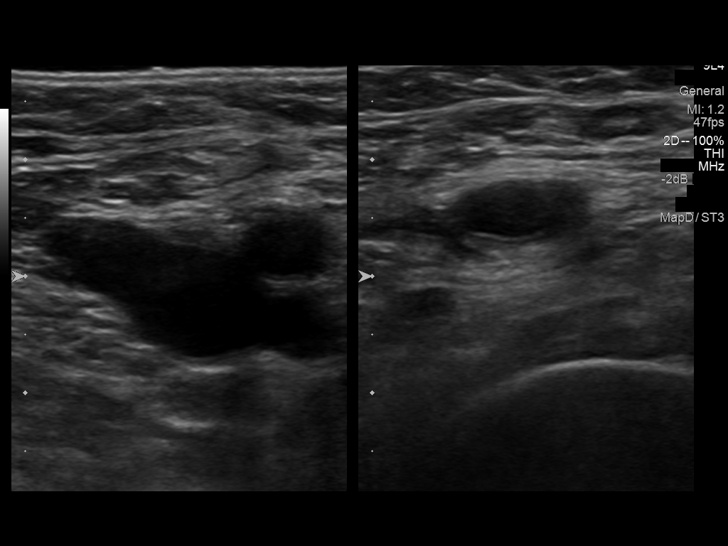
[im 5/51]
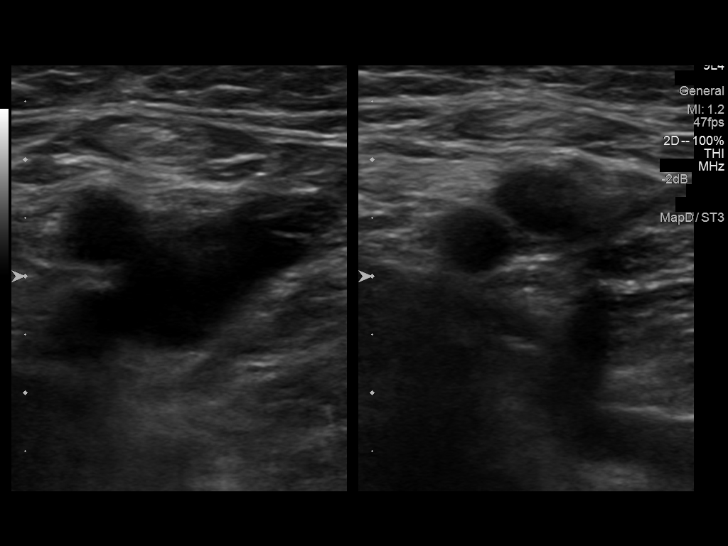
[im 9/51]
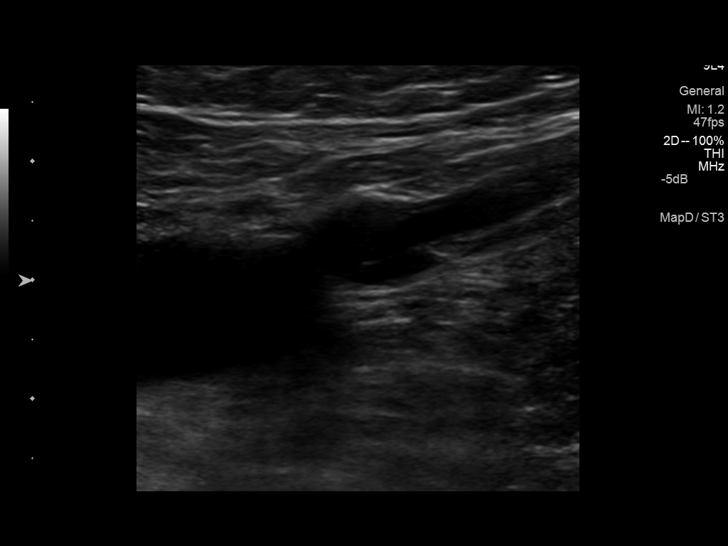
[im 14/51]
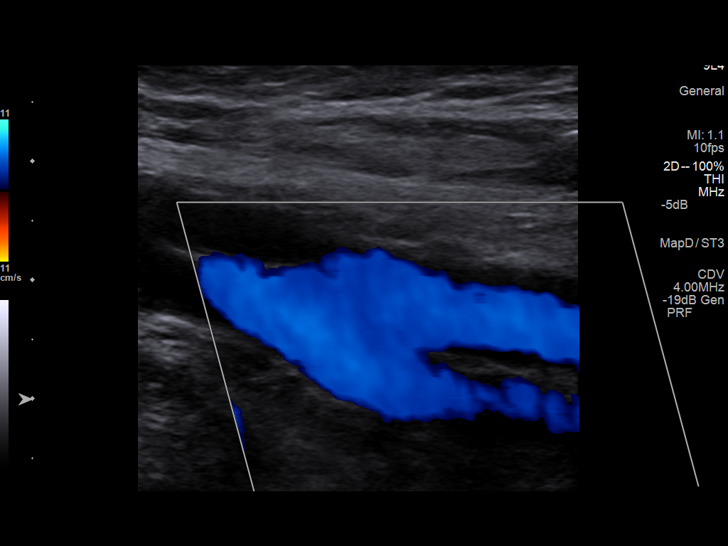
[im 18/51]
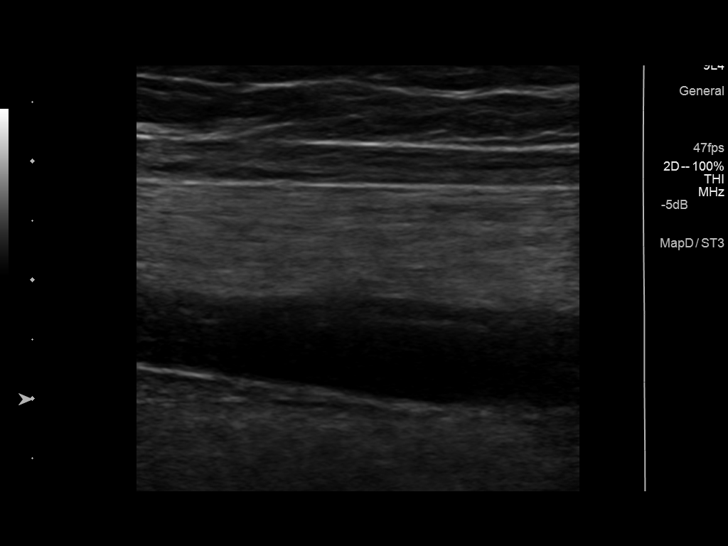
[im 22/51]
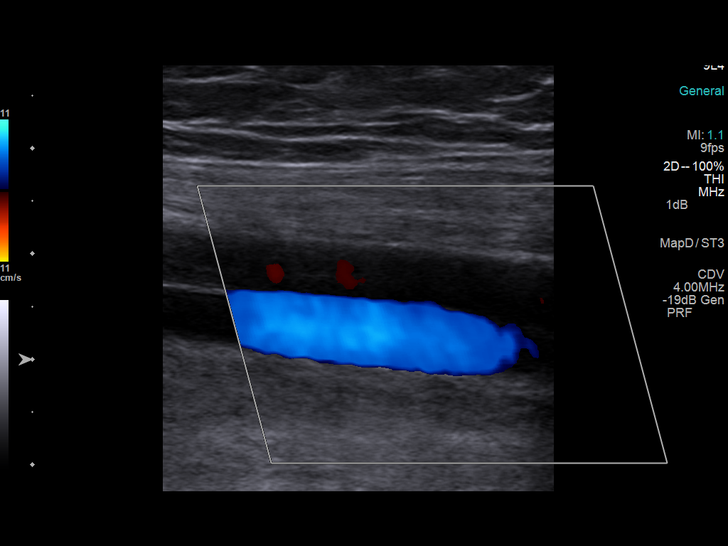
[im 27/51]
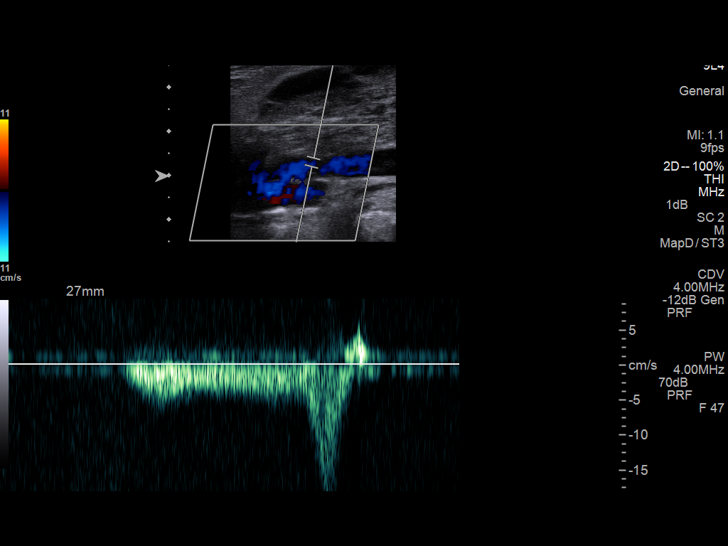
[im 29/51]
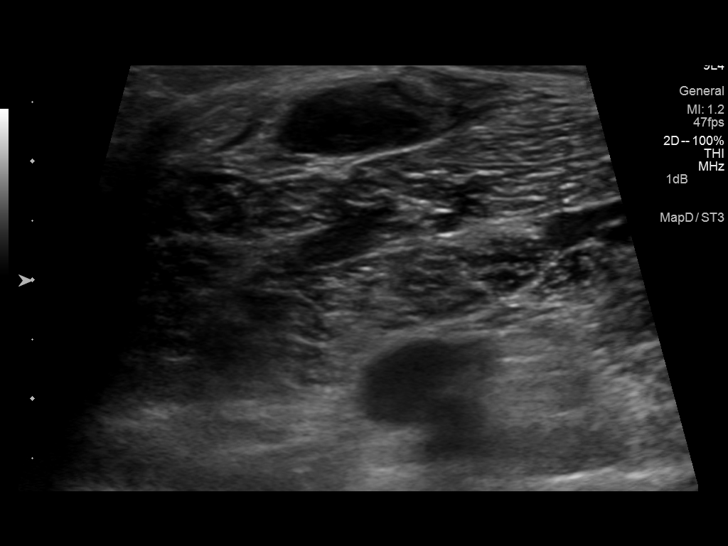
[im 33/51]
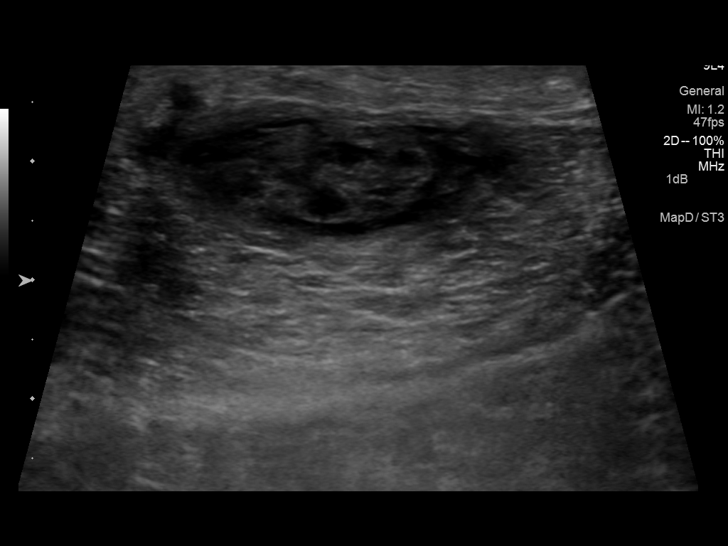
[im 37/51]
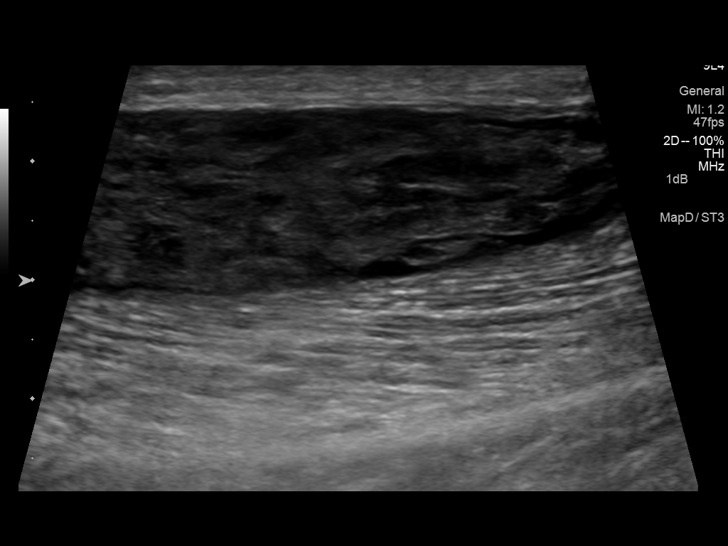
[im 42/51]
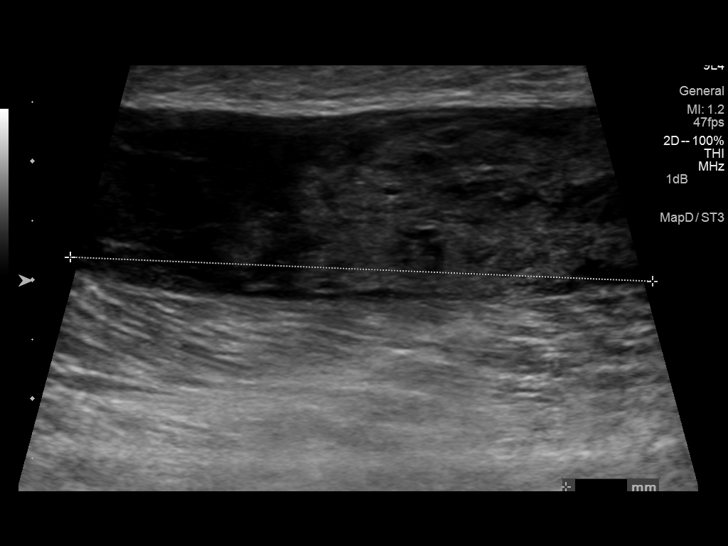
[im 46/51]
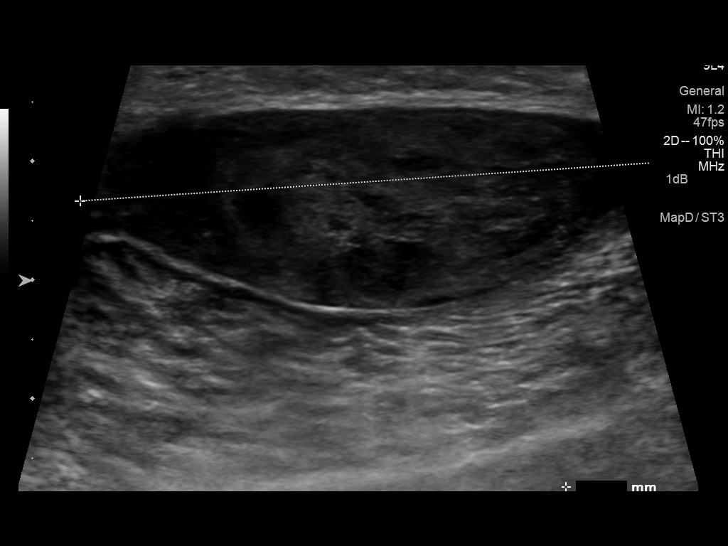
[im 51/51]
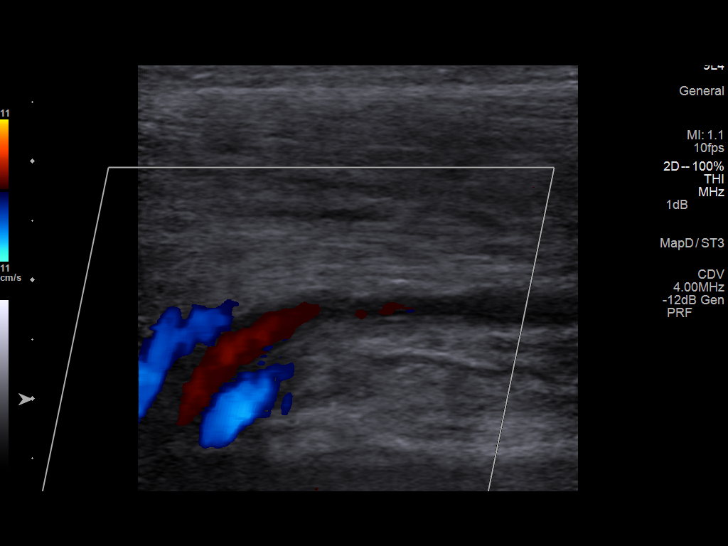

[13 of 24 positions shown; findings below may reference images not displayed]

FINDINGS: Contralateral Common Femoral Vein: Respiratory phasicity is normal
and symmetric with the symptomatic side. No evidence of thrombus.
Normal compressibility.

Common Femoral Vein: No evidence of thrombus. Normal
compressibility, respiratory phasicity and response to augmentation.

Saphenofemoral Junction: No evidence of thrombus. Normal
compressibility and flow on color Doppler imaging.

Profunda Femoral Vein: No evidence of thrombus. Normal
compressibility and flow on color Doppler imaging.

Femoral Vein: No evidence of thrombus. Normal compressibility,
respiratory phasicity and response to augmentation.

Popliteal Vein: No evidence of thrombus. Normal compressibility,
respiratory phasicity and response to augmentation.

Calf Veins: No evidence of thrombus. Normal compressibility and flow
on color Doppler imaging.

Superficial Great Saphenous Vein: No evidence of thrombus. Normal
compressibility and flow on color Doppler imaging.

Venous Reflux:  None.

Other Findings: No evidence of superficial thrombophlebitis. Complex
superficial fluid collection in the medial aspect of the lower leg
extends from the distal thigh into the mid calf. Findings are
consistent with hematoma and the overall size of the hematoma is
approximately 17 x 2 x 5 cm.
IMPRESSION: No evidence of right lower extremity deep venous thrombosis.
Elongated hematoma identified in the subcutaneous tissues of the
medial leg beginning at the level of the distal thigh and extending
into the mid calf.

## 2016-03-14 IMAGING — DX DG ELBOW COMPLETE 3+V*R*
4 series · 4 of 4 positions shown · non-contrast
Comparison: None.

CLINICAL DATA: Fell getting off couch today.

EXAM:
RIGHT ELBOW - COMPLETE 3+ VIEW

[elbow ap]
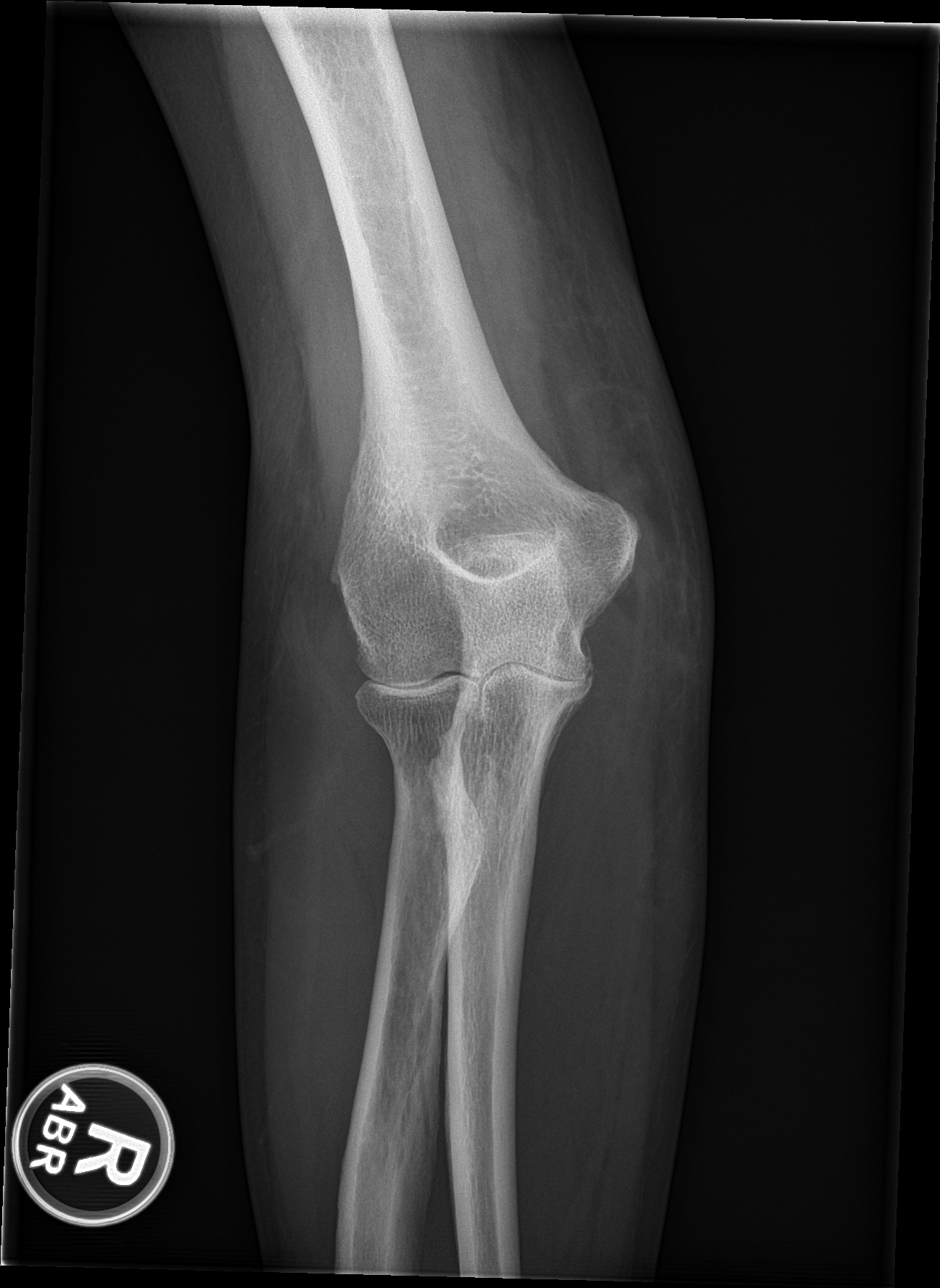

[elbow obl (1 of 2)]
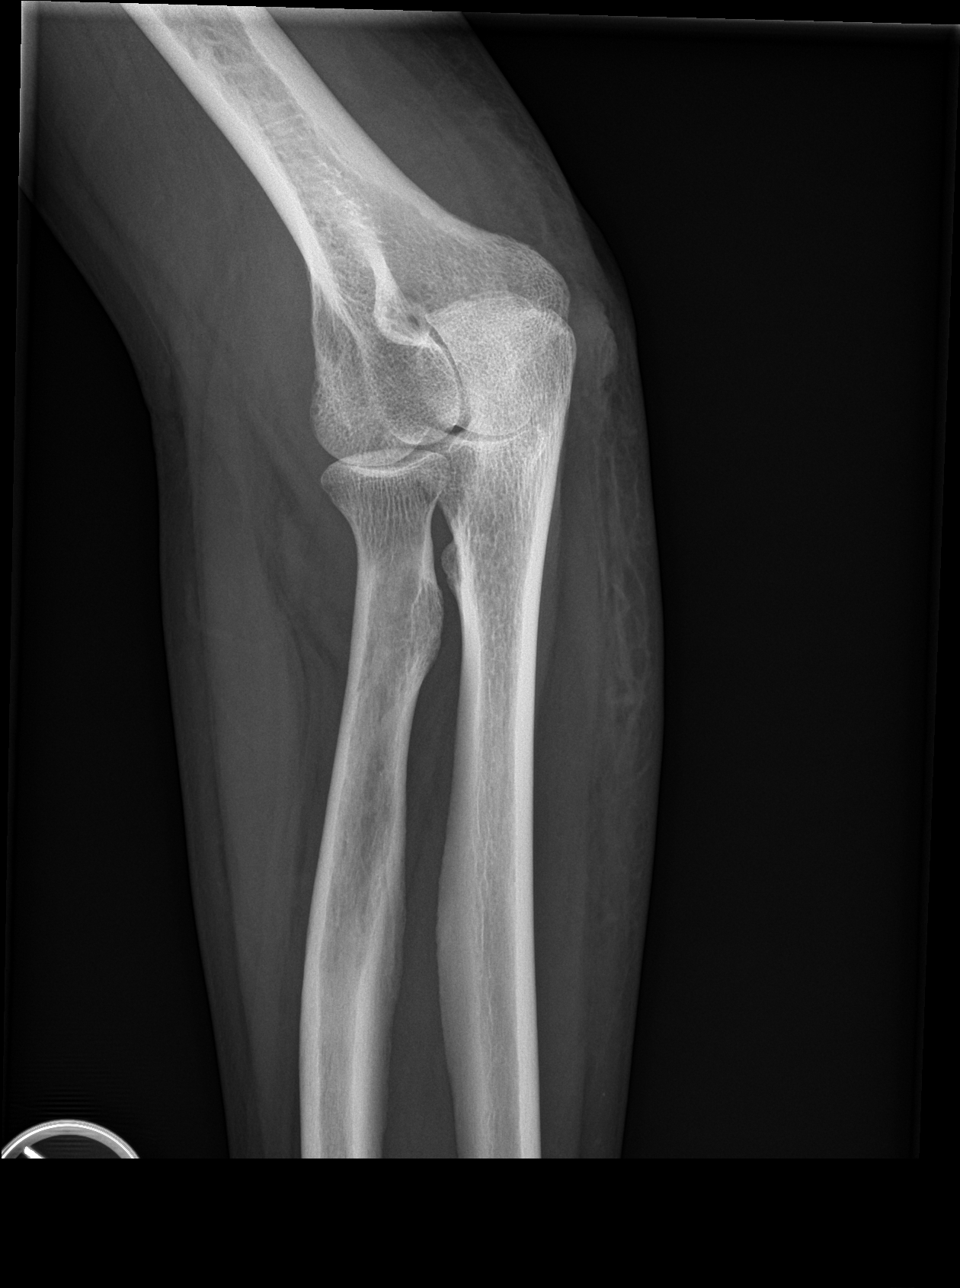

[elbow obl (2 of 2)]
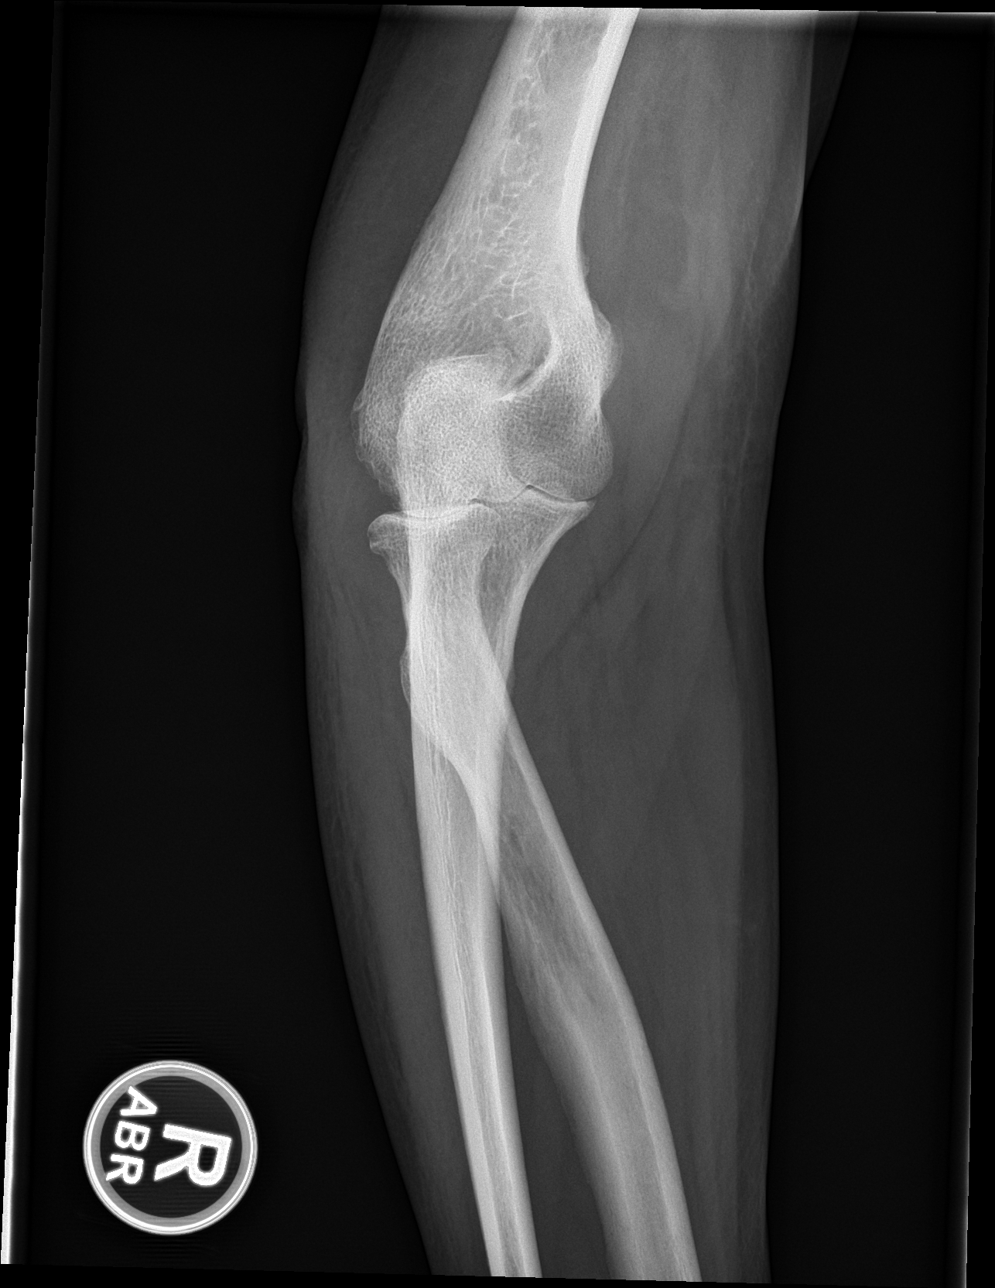

[elbow lat]
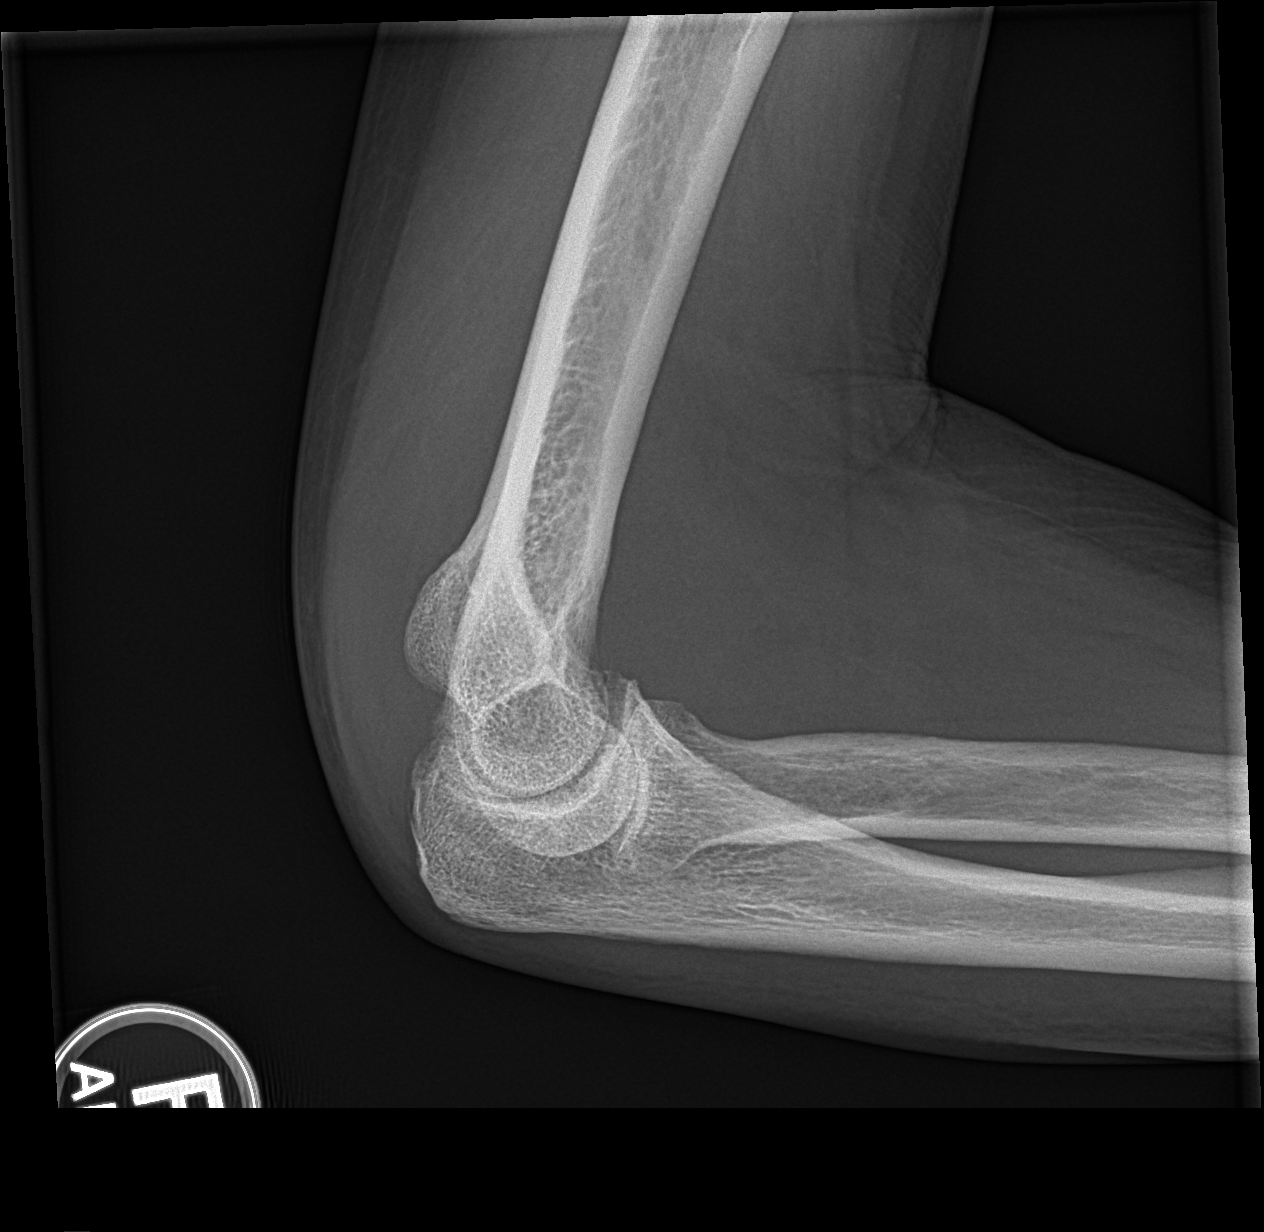

[4 of 4 positions shown; findings below may reference images not displayed]

FINDINGS: There is no evidence of fracture, dislocation, or joint effusion.
There is no evidence of arthropathy or other focal bone abnormality.
Soft tissues are unremarkable.
IMPRESSION: Negative.

## 2016-04-08 ENCOUNTER — Other Ambulatory Visit: Payer: Self-pay | Admitting: Physician Assistant

## 2016-04-08 DIAGNOSIS — R87613 High grade squamous intraepithelial lesion on cytologic smear of cervix (HGSIL): Secondary | ICD-10-CM

## 2016-04-09 ENCOUNTER — Ambulatory Visit: Payer: Self-pay | Admitting: "Endocrinology

## 2016-04-16 IMAGING — CR DG KNEE COMPLETE 4+V*L*
1 series · 4 of 4 positions shown · non-contrast
Comparison: March 02, 2014

CLINICAL DATA: Pain following fall.  Swelling laterally

EXAM:
LEFT KNEE - COMPLETE 4+ VIEW

[Series 1: ap · 0.17mm/px · 4 of 4 slices shown]
[im 1/4]
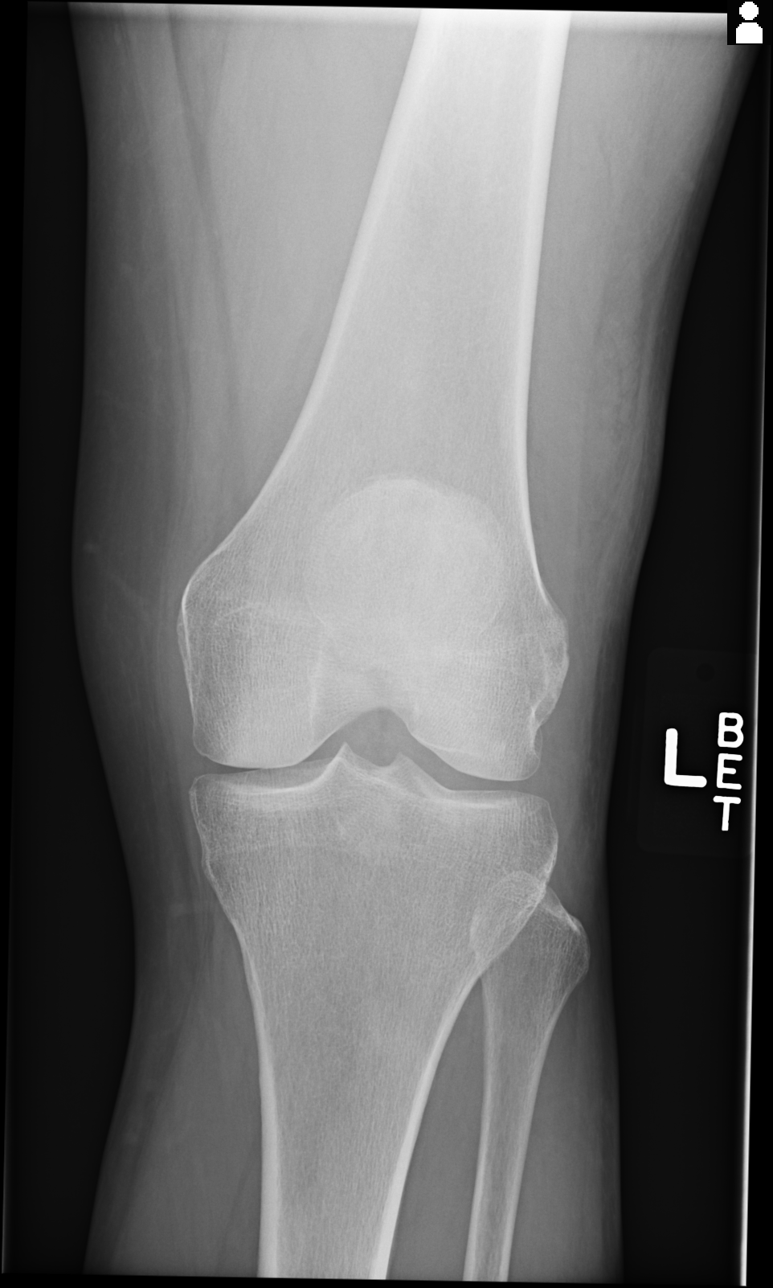
[im 2/4]
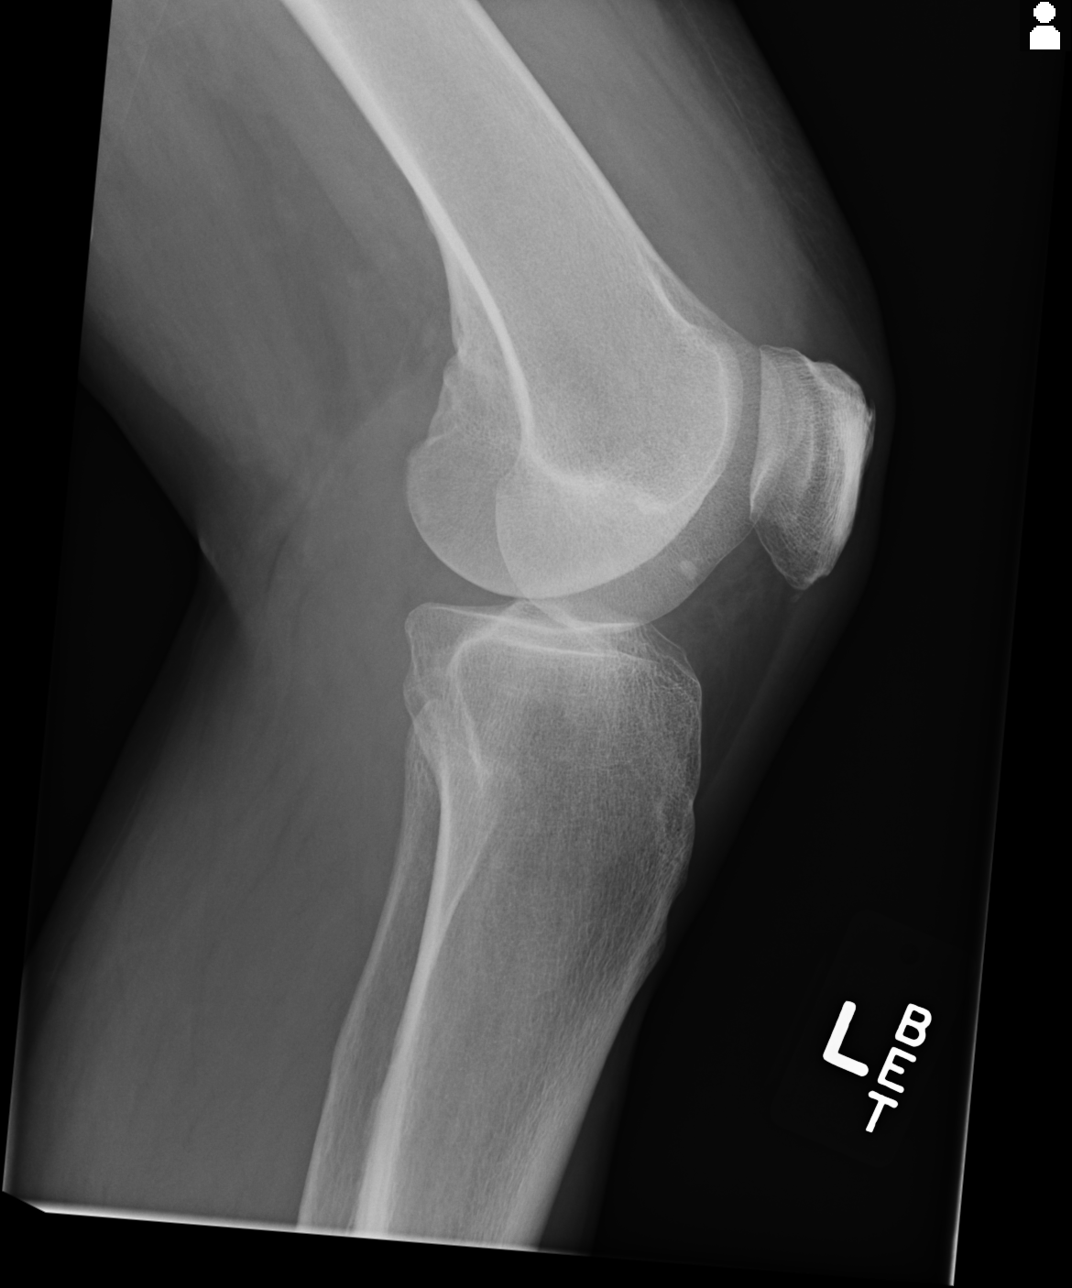
[im 3/4]
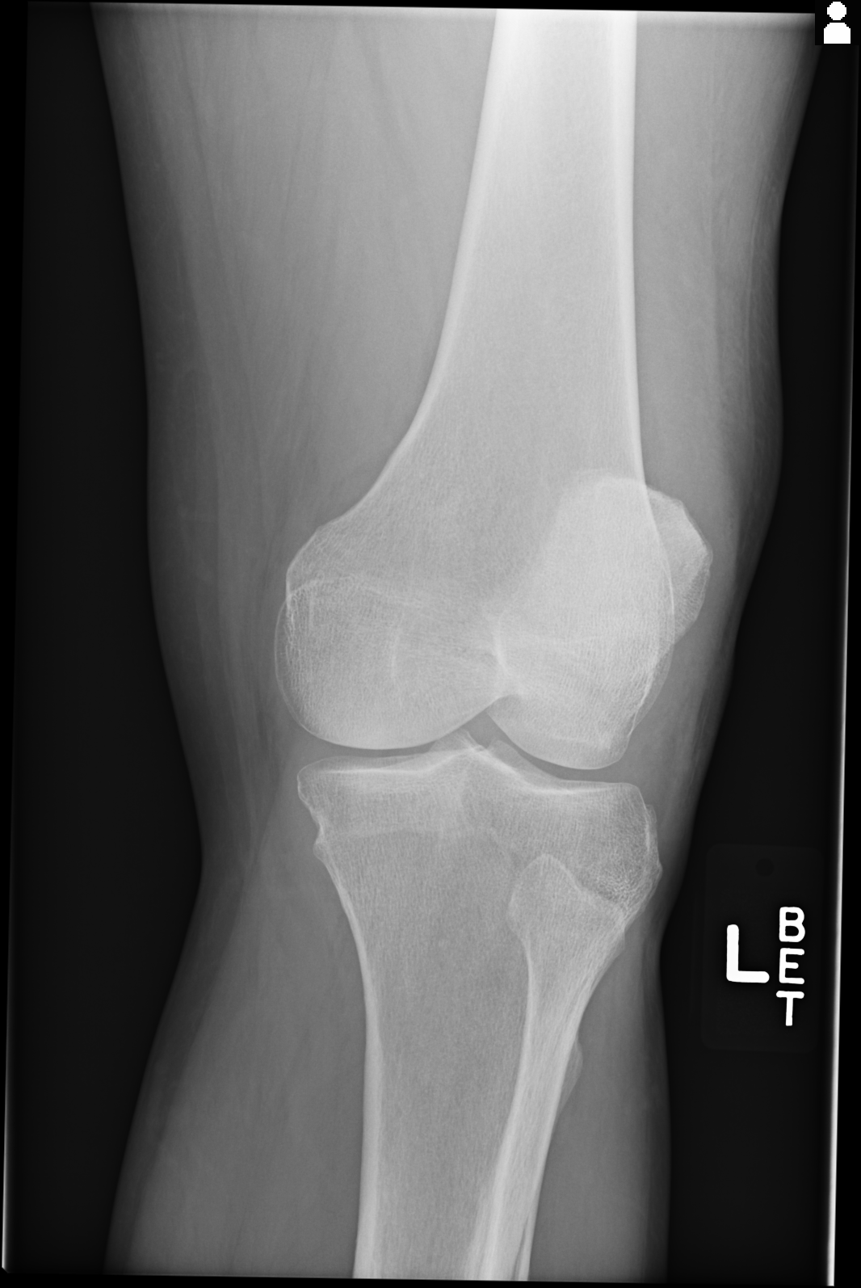
[im 4/4]
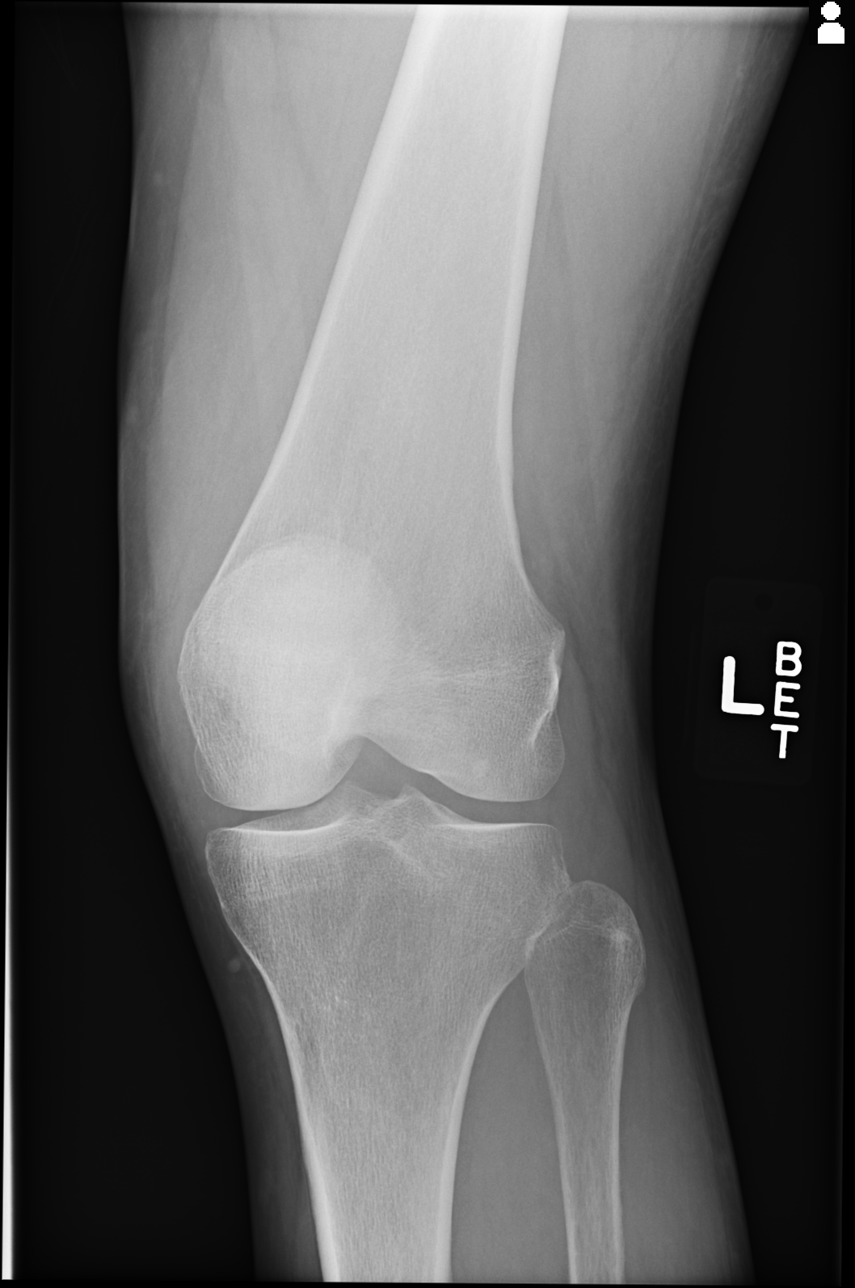

[4 of 4 positions shown; findings below may reference images not displayed]

FINDINGS: Frontal, lateral, and bilateral oblique views were obtained. There
is no fracture or dislocation. Joint spaces appear intact. No
appreciable joint space narrowing. No erosive change.
IMPRESSION: No fracture or dislocation.  No appreciable arthropathy.

## 2016-04-16 IMAGING — CR DG CHEST 1V PORT
1 series · 1 of 1 positions shown · non-contrast
Comparison: January 10, 2014

CLINICAL DATA: Chest pain for several hours

EXAM:
PORTABLE CHEST - 1 VIEW

[portable]
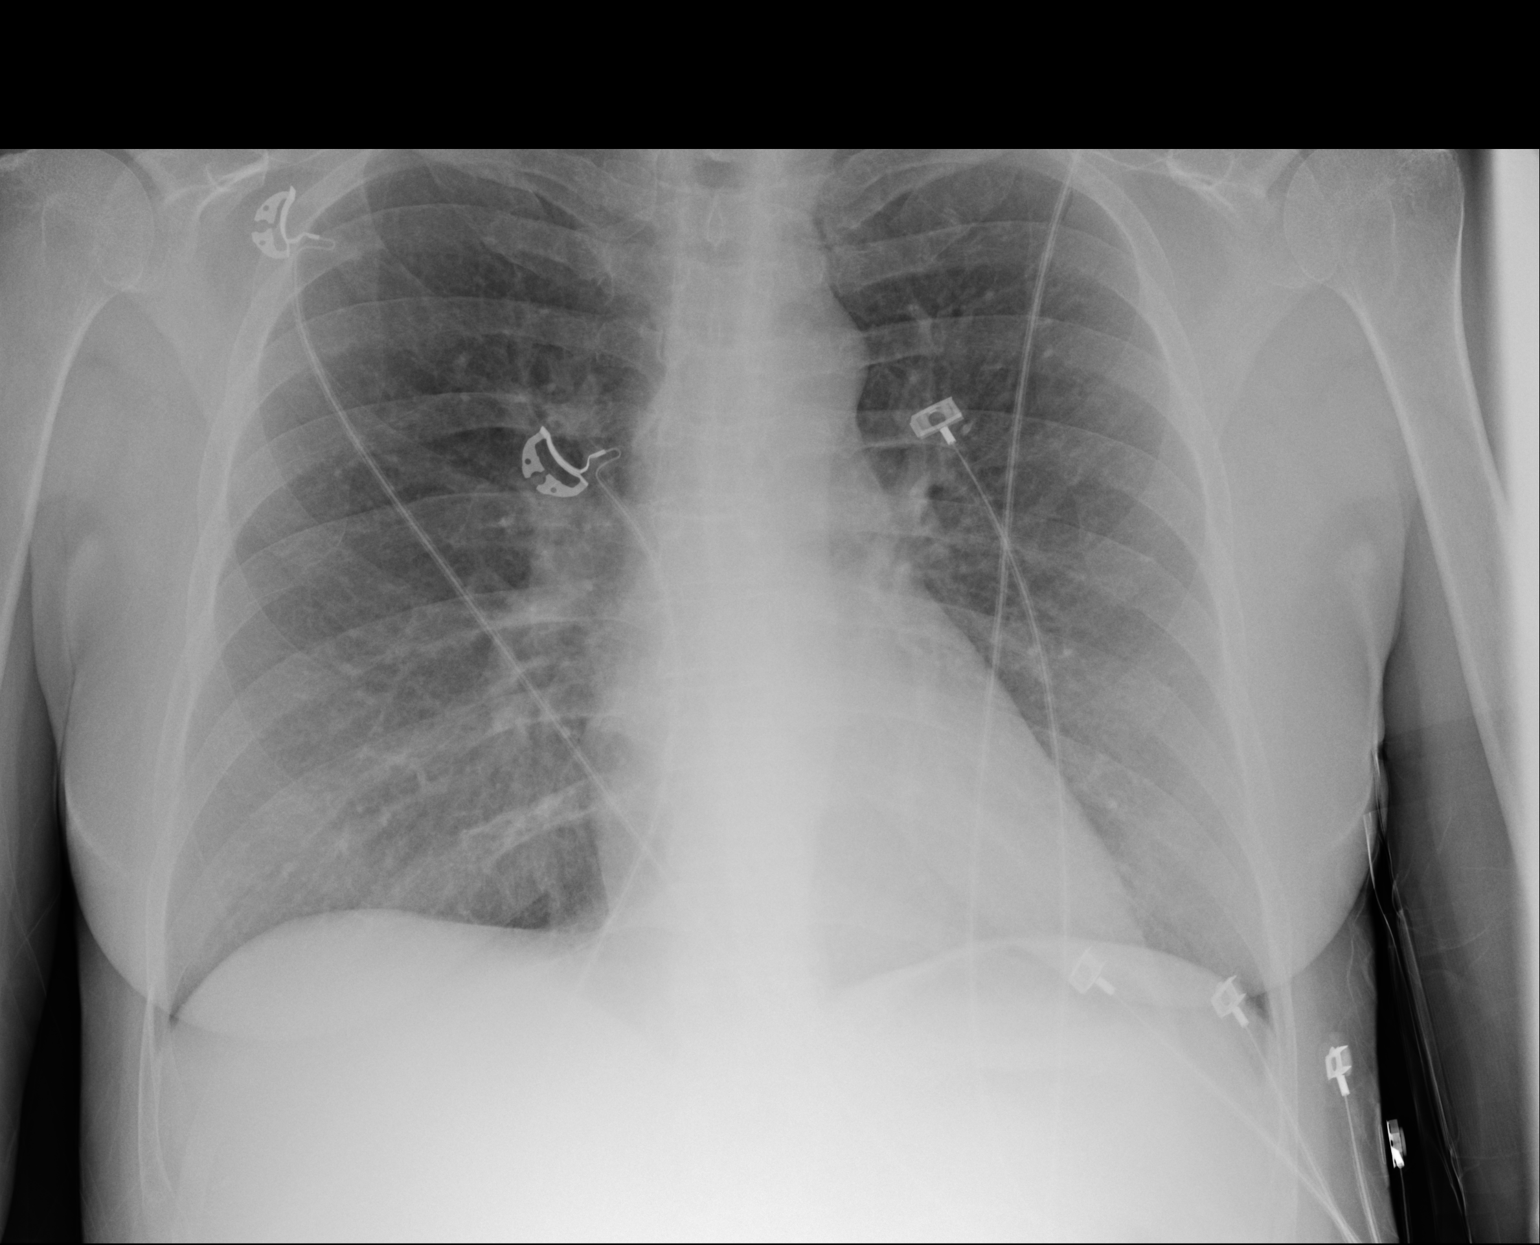

[1 of 1 positions shown; findings below may reference images not displayed]

FINDINGS: Lungs are clear. Heart size and pulmonary vascularity are normal. No
adenopathy. No pneumothorax. No bone lesions.
IMPRESSION: No edema or consolidation.

## 2016-04-20 ENCOUNTER — Telehealth: Payer: Self-pay | Admitting: Student

## 2016-04-20 ENCOUNTER — Other Ambulatory Visit: Payer: Self-pay | Admitting: Physician Assistant

## 2016-04-20 MED ORDER — METRONIDAZOLE 500 MG PO TABS: 2000.0000 mg | ORAL_TABLET | Freq: Once | ORAL | 0 refills | Status: AC

## 2016-04-20 NOTE — Telephone Encounter (Signed)
Called and notified pt that flagyl was sent to [facility] to treat trich detected on pap done 02-26-16. Pt was reminded to avoid alcohol and to notify all sexual partners in order for them to seek treatment. Pt verbalized understanding.

## 2016-04-21 IMAGING — CT CT ANGIO CHEST
1 of 6 series · 5 of 36 positions shown · IV contrast (Omnipaque 300)
Comparison: 04/09/2014, 05/01/2013

CLINICAL DATA: Acute pleuritic chest pain shortness of breath for 2
weeks.

EXAM:
CT ANGIOGRAPHY CHEST WITH CONTRAST
TECHNIQUE: Multidetector CT imaging of the chest was performed using the
standard protocol during bolus administration of intravenous
contrast. Multiplanar CT image reconstructions and MIPs were
obtained to evaluate the vascular anatomy.
CONTRAST:  100mL OMNIPAQUE IOHEXOL 350 MG/ML SOLN

[Series 4: pe 3.0 b40f · axial · 0.67mm/px · z∈[-250,-70]mm · 5 of 91 slices shown]
[im 16/91  lung]
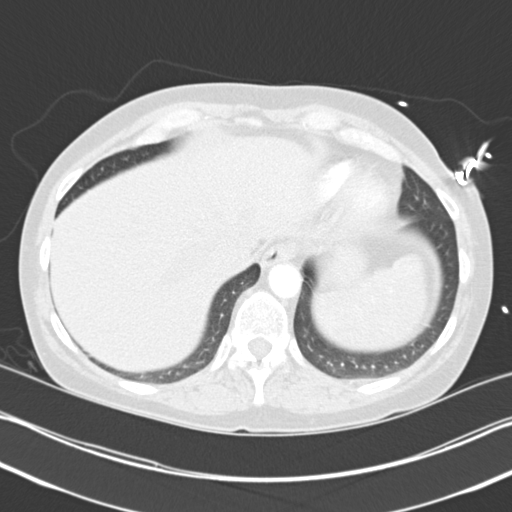
[im 31/91  mediastinal]
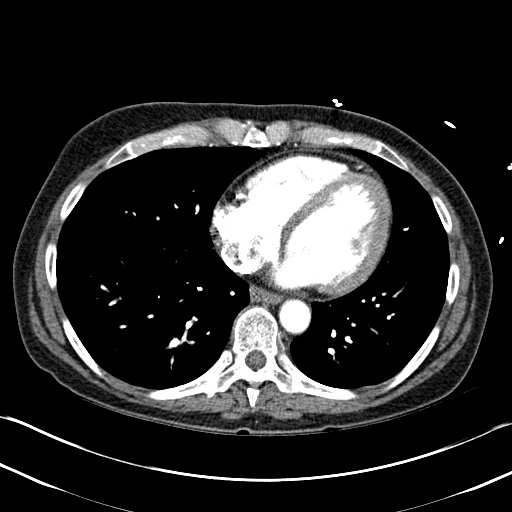
[im 46/91  lung]
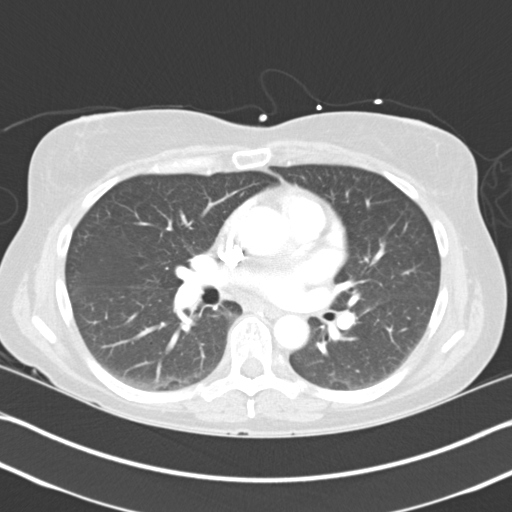
[im 61/91  mediastinal]
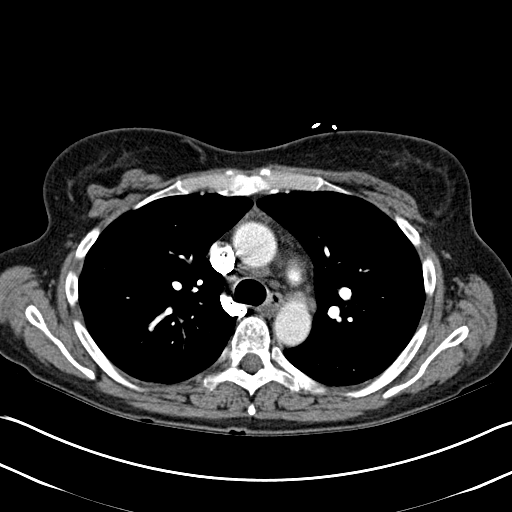
[im 76/91  lung]
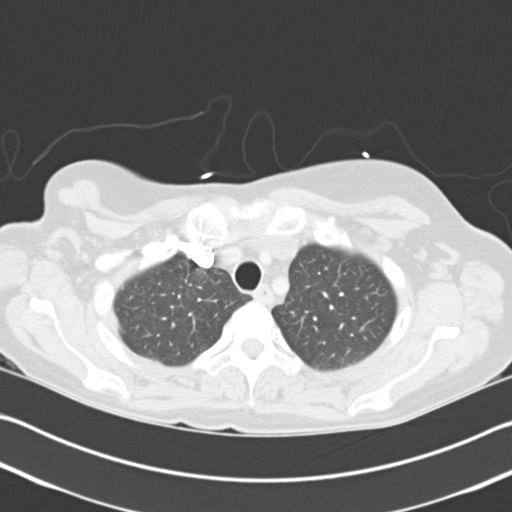

[5 of 36 positions shown; findings below may reference images not displayed]

FINDINGS: Pulmonary arteries are well visualized. No significant filling
defect or pulmonary embolus demonstrated by CTA. No evidence of
right heart strain. No pericardial or pleural effusion. Intact
thoracic aorta. Negative for aneurysm or dissection. No mediastinal
hemorrhage or hematoma. Negative for adenopathy. No hiatal hernia.

Included upper abdomen demonstrates no acute finding.

Lung windows demonstrate minor dependent basilar atelectasis. No
focal pneumonia, collapse or consolidation. No acute airspace
process, interstitial disease or edema. Negative for pneumothorax.
Trachea and central airways are patent.

Review of the MIP images confirms the above findings.
IMPRESSION: No significant acute pulmonary embolus by CTA.

Minor basilar atelectasis.

No acute intra thoracic finding.

## 2016-04-21 IMAGING — DX DG CHEST 2V
2 series · 2 of 2 positions shown · non-contrast
Comparison: Portable chest x-ray April 04, 2014.

CLINICAL DATA: One week of pleuritic chest discomfort worse since
this morning

EXAM:
CHEST  2 VIEW

[chest pa]
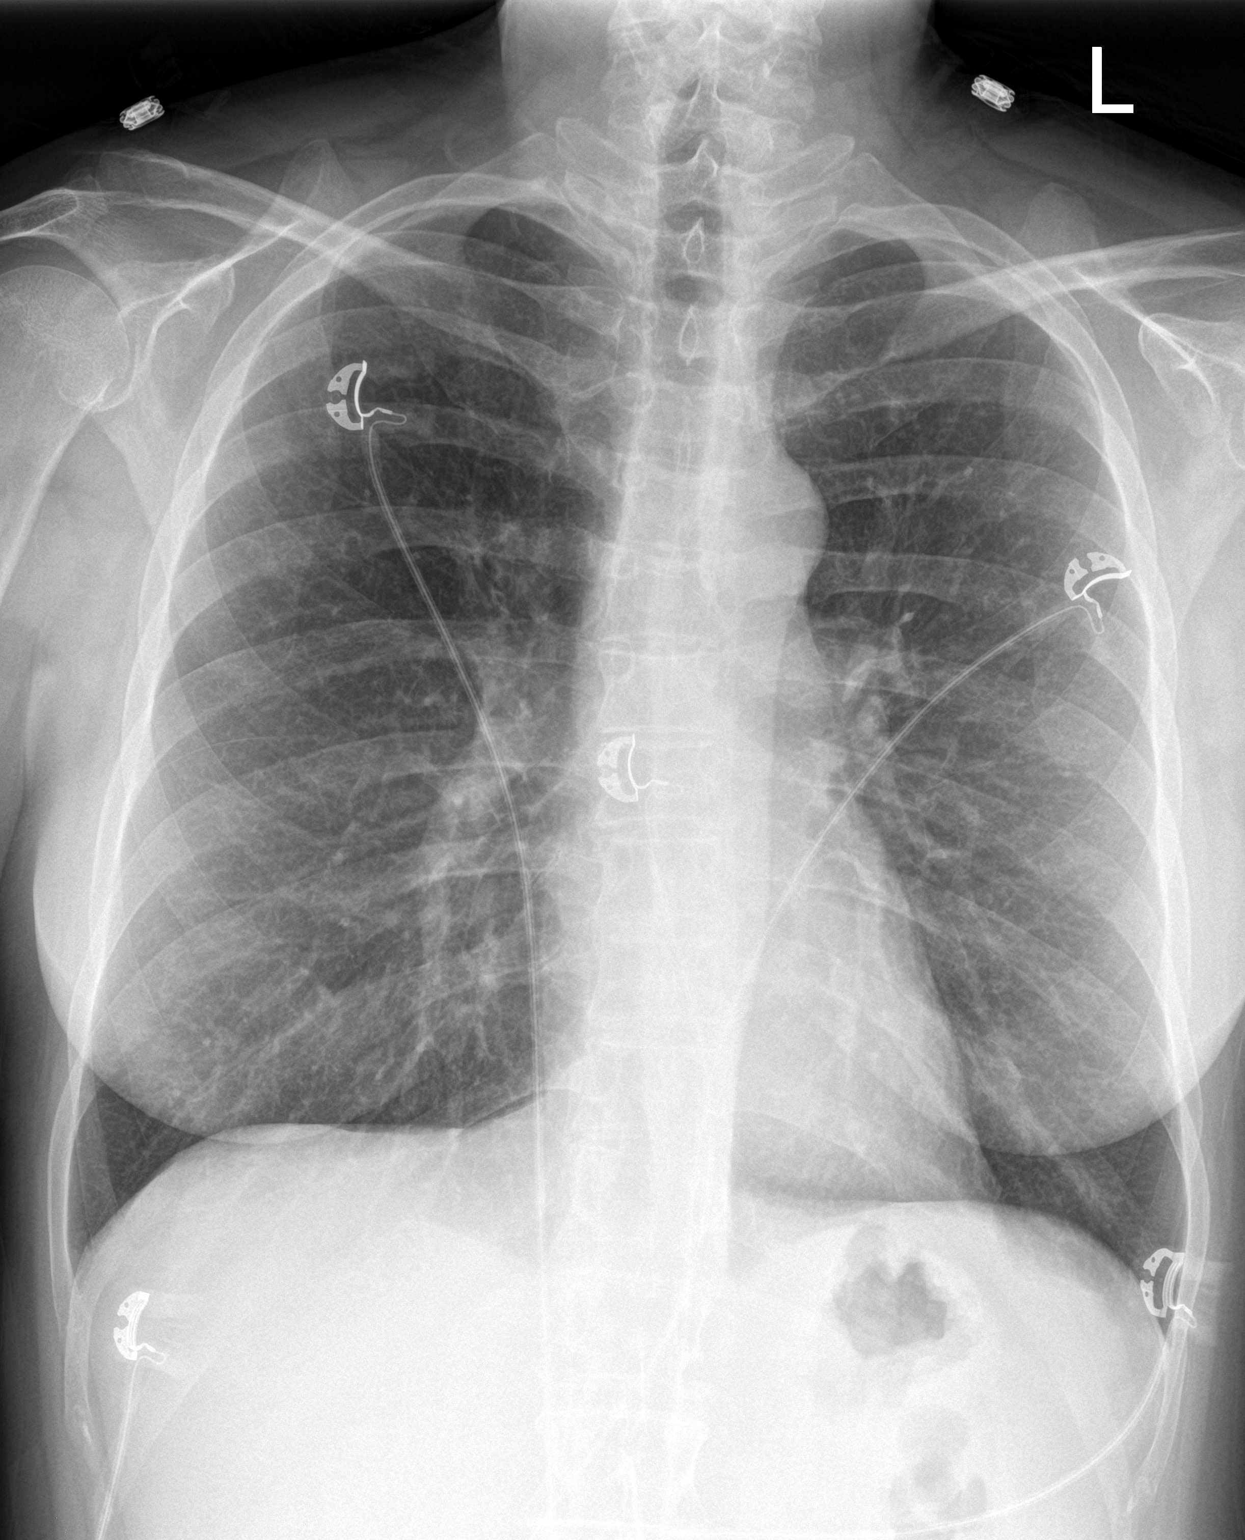

[chest lat]
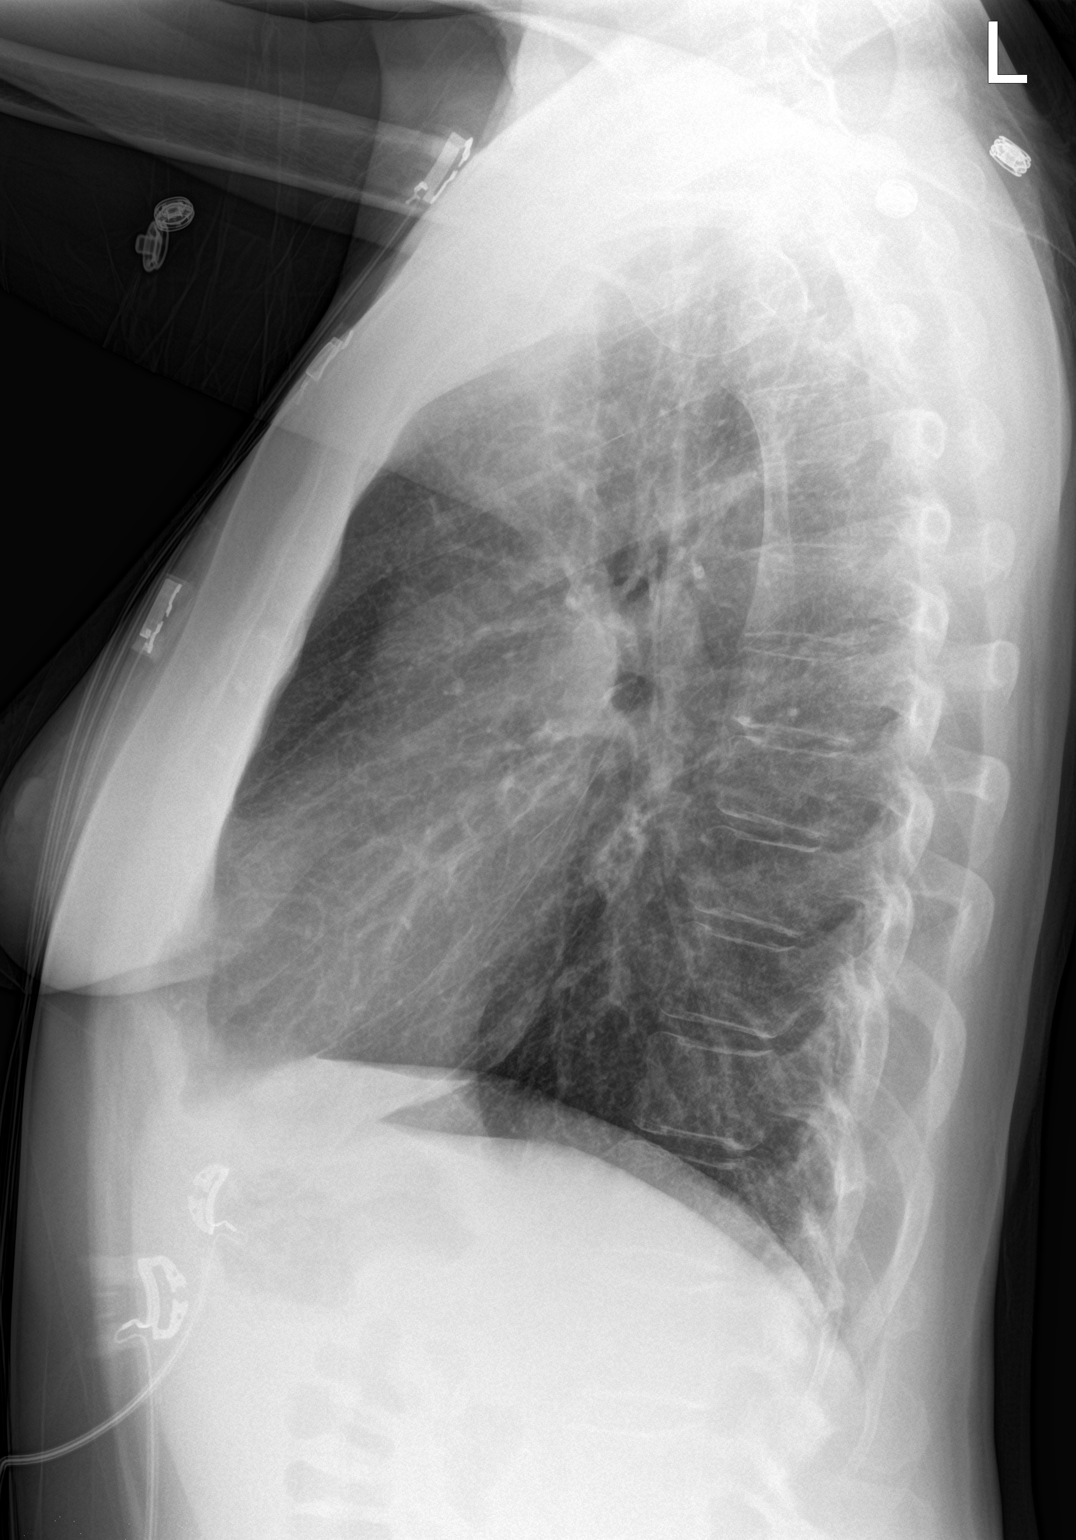

[2 of 2 positions shown; findings below may reference images not displayed]

FINDINGS: The lungs are hyperinflated. There is no focal infiltrate. The heart
and mediastinal structures are normal. There is no pleural effusion
or pneumothorax. The trachea is midline. The bony thorax is
unremarkable.
IMPRESSION: COPD. There is no pneumonia nor other acute cardiopulmonary
abnormality.

## 2016-04-23 ENCOUNTER — Encounter: Payer: Self-pay | Admitting: Obstetrics and Gynecology

## 2016-04-23 ENCOUNTER — Ambulatory Visit (INDEPENDENT_AMBULATORY_CARE_PROVIDER_SITE_OTHER): Payer: Self-pay | Admitting: Obstetrics and Gynecology

## 2016-04-23 DIAGNOSIS — R87613 High grade squamous intraepithelial lesion on cytologic smear of cervix (HGSIL): Secondary | ICD-10-CM

## 2016-04-23 DIAGNOSIS — A5901 Trichomonal vulvovaginitis: Secondary | ICD-10-CM | POA: Insufficient documentation

## 2016-04-23 MED ORDER — METRONIDAZOLE 500 MG PO TABS: 500.0000 mg | ORAL_TABLET | Freq: Two times a day (BID) | ORAL | 0 refills | Status: DC

## 2016-04-23 NOTE — Patient Instructions (Signed)
tricht Trichomoniasis Trichomoniasis is an STI (sexually transmitted infection) that can affect both women and men. In women, the outer area of the female genitalia (vulva) and the vagina are affected. In men, the penis is mainly affected, but the prostate and other reproductive organs can also be involved. This condition can be treated with medicine. It often has no symptoms (is asymptomatic), especially in men. What are the causes? This condition is caused by an organism called Trichomonas vaginalis. Trichomoniasis most often spreads from person to person (is contagious) through sexual contact. What increases the risk? The following factors may make you more likely to develop this condition:  Having unprotected sexual intercourse.  Having sexual intercourse with a partner who has trichomoniasis.  Having multiple sexual partners.  Having had previous trichomoniasis infections or other STIs. What are the signs or symptoms? In women, symptoms of trichomoniasis include:  Abnormal vaginal discharge that is clear, white, gray, or yellow-green and foamy and has an unusual "fishy" odor.  Itching and irritation of the vagina and vulva.  Burning or pain during urination or sexual intercourse.  Genital redness and swelling. In men, symptoms of trichomoniasis include:  Penile discharge that may be foamy or contain pus.  Pain in the penis. This may happen only when urinating.  Itching or irritation inside the penis.  Burning after urination or ejaculation. How is this diagnosed? In women, this condition may be found during a routine Pap test or physical exam. It may be found in men during a routine physical exam. Your health care provider may perform tests to help diagnose this infection, such as:  Urine tests (men and women).  The following in women:  Testing the pH of the vagina.  A vaginal swab test that checks for the Trichomonas vaginalis organism.  Testing vaginal  secretions. Your health care provider may test you for other STIs, including HIV (human immunodeficiency virus). How is this treated? This condition is treated with medicine taken by mouth (orally), such as metronidazole or tinidazole to fight the infection. Your sexual partner(s) may also need to be tested and treated.  If you are a woman and you plan to become pregnant or think you may be pregnant, tell your health care provider right away. Some medicines that are used to treat the infection should not be taken during pregnancy. Your health care provider may recommend over-the-counter medicines or creams to help relieve itching or irritation. You may be tested for infection again 3 months after treatment. Follow these instructions at home:  Take and use over-the-counter and prescription medicines, including creams, only as told by your health care provider.  Do not have sexual intercourse until one week after you finish your medicine, or until your health care provider approves. Ask your health care provider when you may resume sexual intercourse.  (Women) Do not douche or wear tampons while you have the infection.  Discuss your infection with your sexual partner(s). Make sure that your partner gets tested and treated, if necessary.  Keep all follow-up visits as told by your health care provider. This is important. How is this prevented?  Use condoms every time you have sex. Using condoms correctly and consistently can help protect against STIs.  Avoid having multiple sexual partners.  Talk with your sexual partner about any symptoms that either of you may have, as well as any history of STIs.  Get tested for STIs and STDs (sexually transmitted diseases) before you have sex. Ask your partner to do the same.  Do not have sexual contact if you have symptoms of trichomoniasis or another STI. Contact a health care provider if:  You still have symptoms after you finish your  medicine.  You develop pain in your abdomen.  You have pain when you urinate.  You have bleeding after sexual intercourse.  You develop a rash.  You feel nauseous or you vomit.  You plan to become pregnant or think you may be pregnant. Summary  Trichomoniasis is an STI (sexually transmitted infection) that can affect both women and men.  This condition often has no symptoms (is asymptomatic), especially in men.  You should not have sexual intercourse until one week after you finish your medicine, or until your health care provider approves. Ask your health care provider when you may resume sexual intercourse.  Discuss your infection with your sexual partner. Make sure that your partner gets tested and treated, if necessary. This information is not intended to replace advice given to you by your health care provider. Make sure you discuss any questions you have with your health care provider. Document Released: 06/24/2000 Document Revised: 11/22/2015 Document Reviewed: 11/22/2015 Elsevier Interactive Patient Education  2017 [employer].

## 2016-04-23 NOTE — Progress Notes (Signed)
[facility] Clinic Visit  @DATE @            Patient name: [name] MRN Venora Maples  Date of birth: [birth year]  CC & HPI:  ALEXINA NICCOLI is a 51 y.o. female presenting today for follow up after abnormal pap smear on 02/27/16. She tested positive for trich and pap showed HSIL. States she has been separated from partner for 3 years and does not recall any sexually activity since then. She has a new partner but has not yet become sexually involved. She currently has increased vaginal discharge. She has no other complaints at this time.   She reports history of abnormal pap smears in ~ 1988/89 at which point she required a colposcopy and a freezing procedure to the cervix.   ROS:  ROS -fever +vaginal discharge  Pertinent History Reviewed:   Reviewed: Significant for BTL, endometrial ablation  Medical         Past Medical History:  Diagnosis Date  . Arthritis, rheumatoid (HCC)   . Chronic anemia   . Chronic pain   . COPD (chronic obstructive pulmonary disease) (HCC)   . Depression   . Hypertension   . Incidental lung nodule   . Osteoporosis   . Rheumatoid arthritis(714.0)                               Surgical Hx:    Past Surgical History:  Procedure Laterality Date  . BREAST CYST EXCISION  12/31/2010   Procedure: CYST EXCISION BREAST;  Surgeon: 01/02/2011, MD;  Location: [facility];  Service: General;  Laterality: Right;  Excision Sebaceous Cyst Right Breast  . ENDOMETRIAL ABLATION    . TUBAL LIGATION     Medications: Reviewed & Updated - see associated section                       Current Outpatient Prescriptions:  .  alendronate (FOSAMAX) 70 MG tablet, Take 70 mg by mouth once a week. Take with a full glass of water on an empty stomach.(Monday), Disp: , Rfl:  .  atorvastatin (LIPITOR) 20 MG tablet, Take 1 tablet (20 mg total) by mouth daily., Disp: 90 tablet, Rfl: 3 .  folic acid (FOLVITE) 1 MG tablet, Take 1 mg by mouth daily., Disp: , Rfl:  .  golimumab 2 mg/kg in  sodium chloride 0.9 %, Inject into the vein once., Disp: , Rfl:  .  IRON PO, Take 1 tablet by mouth daily., Disp: , Rfl:  .  methotrexate (RHEUMATREX) 2.5 MG tablet, Take 20 mg by mouth once a week. Caution:Chemotherapy. Protect from light.(Thursday), Disp: , Rfl:  .  Multiple Vitamin (MULTIVITAMIN WITH MINERALS) TABS, Take 1 tablet by mouth daily. Reported on 05/02/2015, Disp: , Rfl:  .  naproxen (NAPROSYN) 500 MG tablet, Take 500 mg by mouth 2 (two) times daily with a meal., Disp: , Rfl:  .  omeprazole (PRILOSEC) 40 MG capsule, Take 1 capsule (40 mg total) by mouth daily., Disp: 90 capsule, Rfl: 3   Social History: Reviewed -  reports that she has been smoking Cigarettes.  She has a 15.00 pack-year smoking history. She has never used smokeless tobacco.  Objective Findings:  Vitals: Blood pressure (!) 148/80, pulse 84, height 5\' 6"  (1.676 m), weight 146 lb 6.4 oz (66.4 kg).  Physical Examination: General appearance - alert, well appearing, and in no distress Mental status -  alert, oriented to person, place, and time Pelvic - examination not indicated   Assessment & Plan:   A:  1. Abnormal pap, HSIL 2. Trich infection, asymptomatic   P:  1. Treat trich with metronidazole  2. Follow up colposcopy in 1 month 3 brochure re: Ivery Quale to pt   By signing my name below, I, Sonum Patel, attest that this documentation has been prepared under the direction and in the presence of Tilda Burrow, MD. Electronically Signed: Sonum Patel, Neurosurgeon. 04/23/16. 2:18 PM.  I personally performed the services described in this documentation, which was SCRIBED in my presence. The recorded information has been reviewed and considered accurate. It has been edited as necessary during review. Tilda Burrow, MD

## 2016-05-25 ENCOUNTER — Encounter: Payer: Self-pay | Admitting: Obstetrics and Gynecology

## 2016-05-25 ENCOUNTER — Ambulatory Visit: Payer: Self-pay | Admitting: Physician Assistant

## 2016-05-31 IMAGING — DX DG TIBIA/FIBULA 2V*R*
4 series · 4 of 4 positions shown · non-contrast
Comparison: None.

CLINICAL DATA: Horseback riding and foot got caught and stirrups
with twisting injury

EXAM:
RIGHT TIBIA AND FIBULA - 2 VIEW

[tibia ap (1 of 2)]
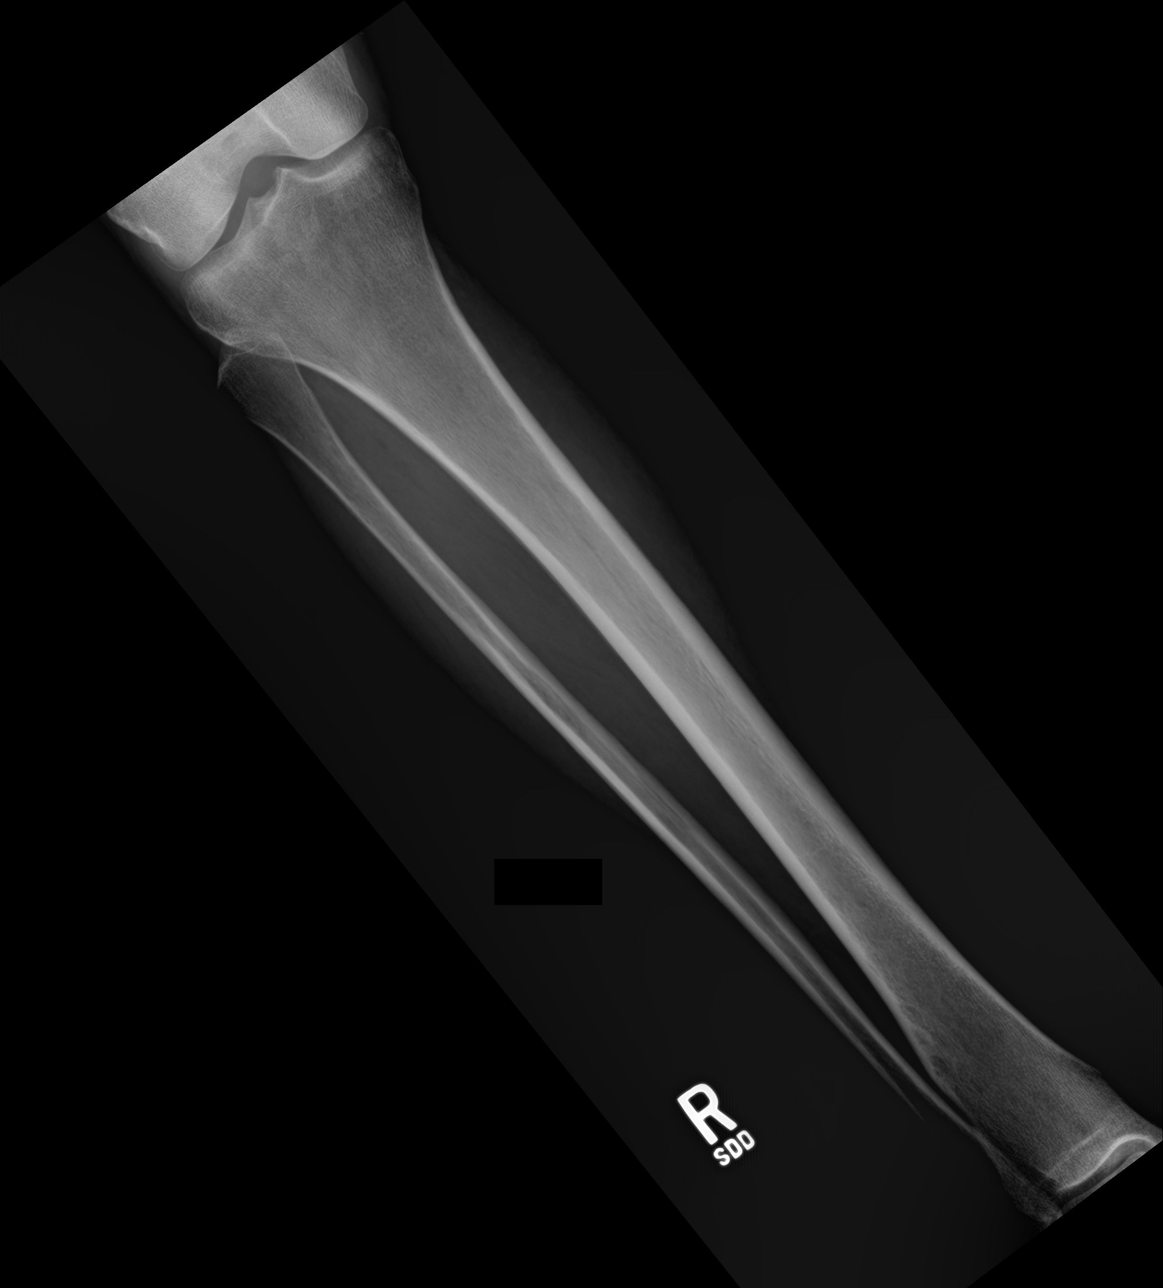

[tibia ap (2 of 2)]
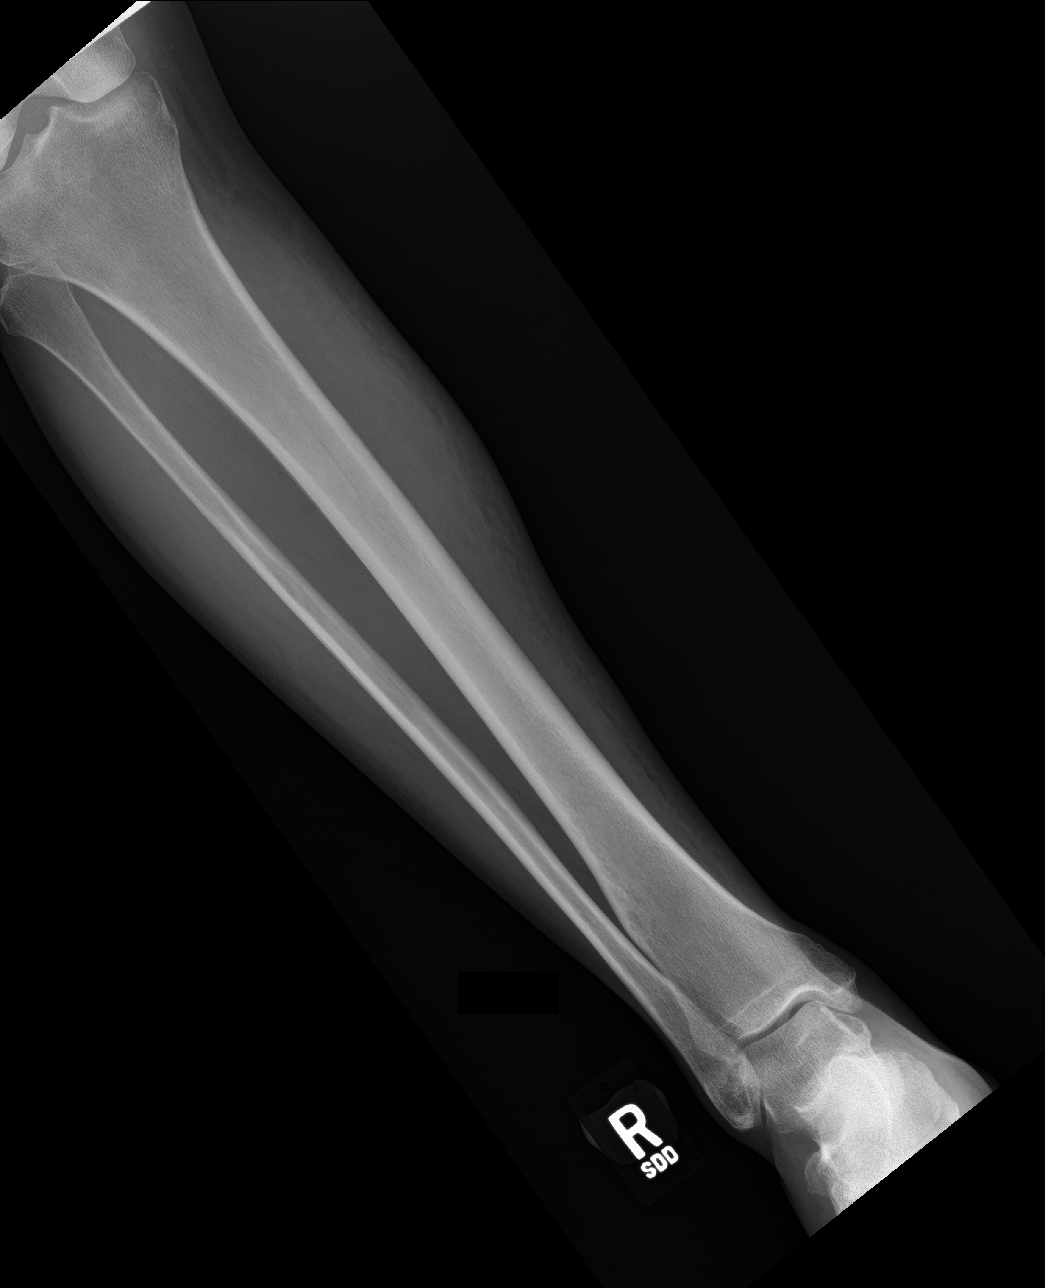

[tibia lat (1 of 2)]
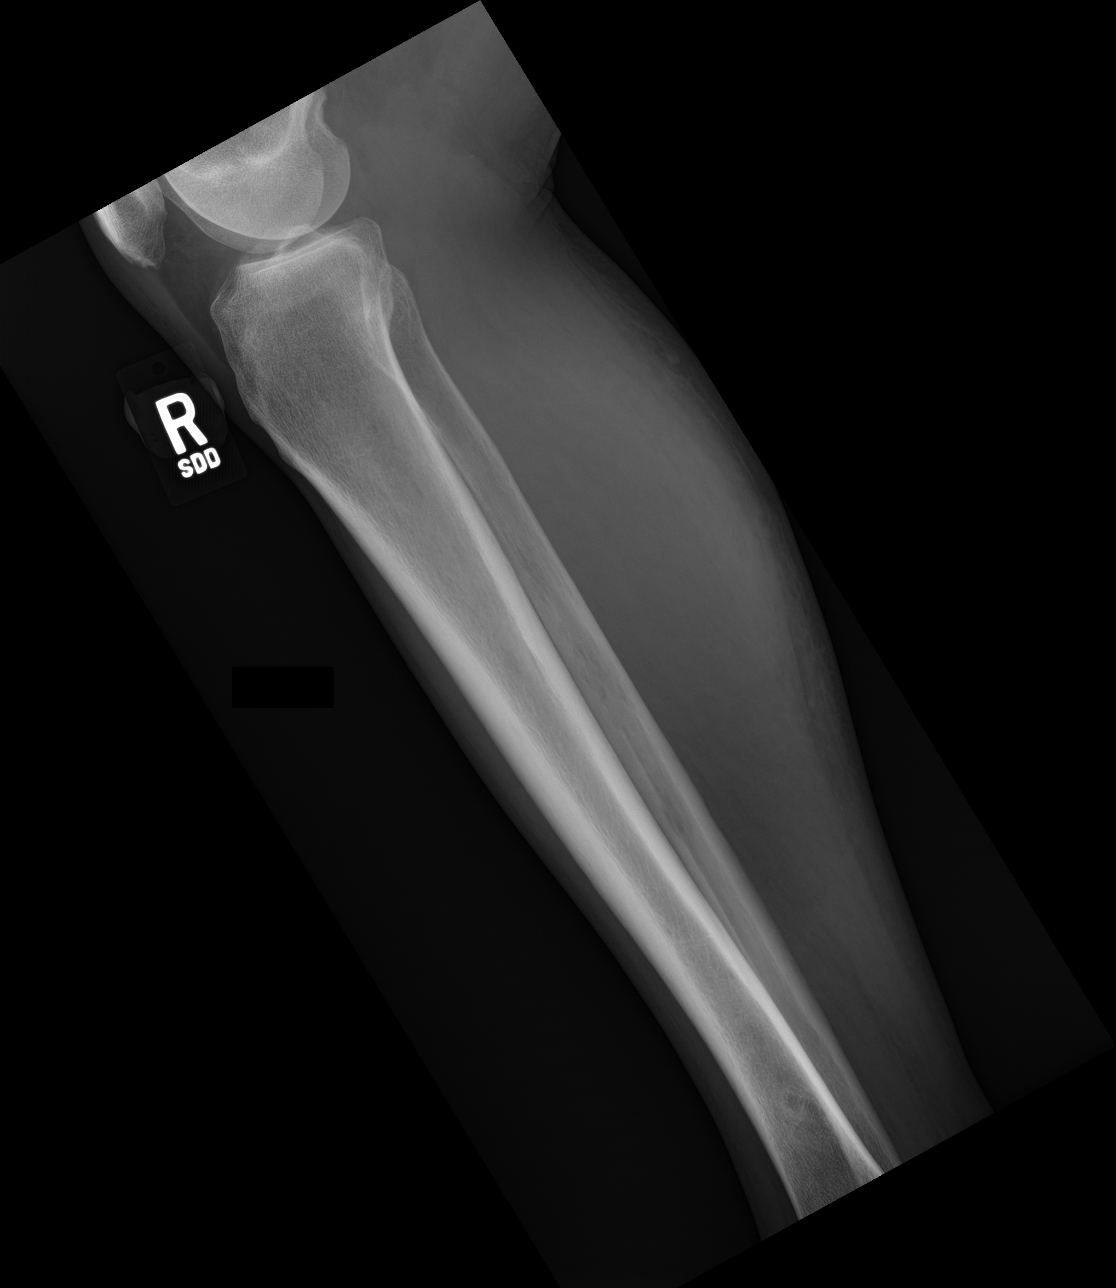

[tibia lat (2 of 2)]
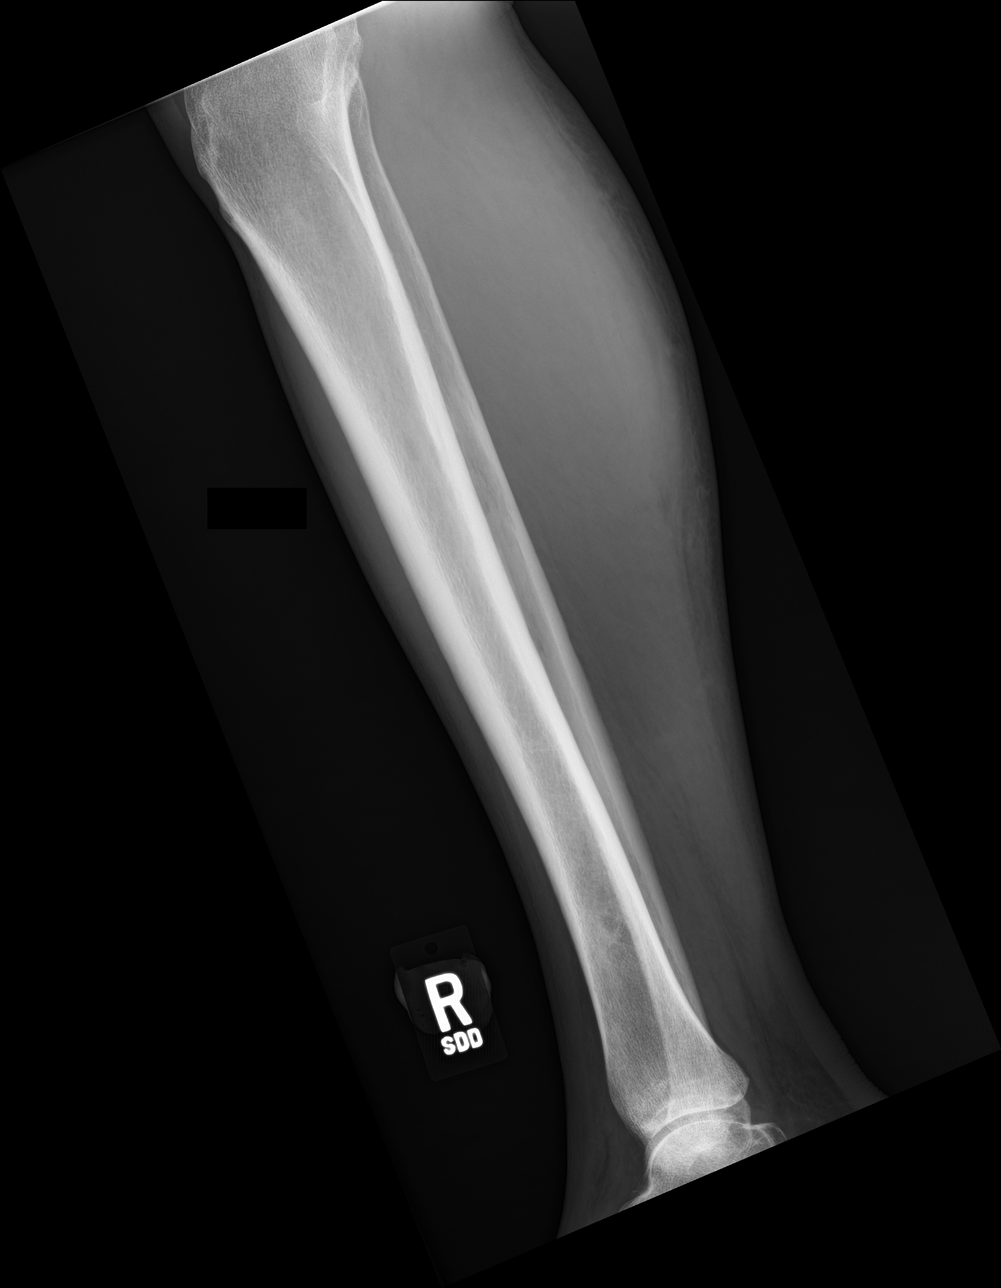

[4 of 4 positions shown; findings below may reference images not displayed]

FINDINGS: A fibrous cortical defect is noted in the distal tibial metaphysis
laterally. No acute fracture or dislocation is noted. Mild soft
tissue swelling is seen.
IMPRESSION: Mild soft tissue swelling without acute abnormality.

## 2016-05-31 IMAGING — DX DG CERVICAL SPINE COMPLETE 4+V
5 series · 5 of 5 positions shown · non-contrast
Comparison: CT cervical spine 01/10/2014

CLINICAL DATA: Posterior neck pain, fell from horse and foot caught
in stirrup, then dragged behind

EXAM:
CERVICAL SPINE  4+ VIEWS

[c-spine lat]
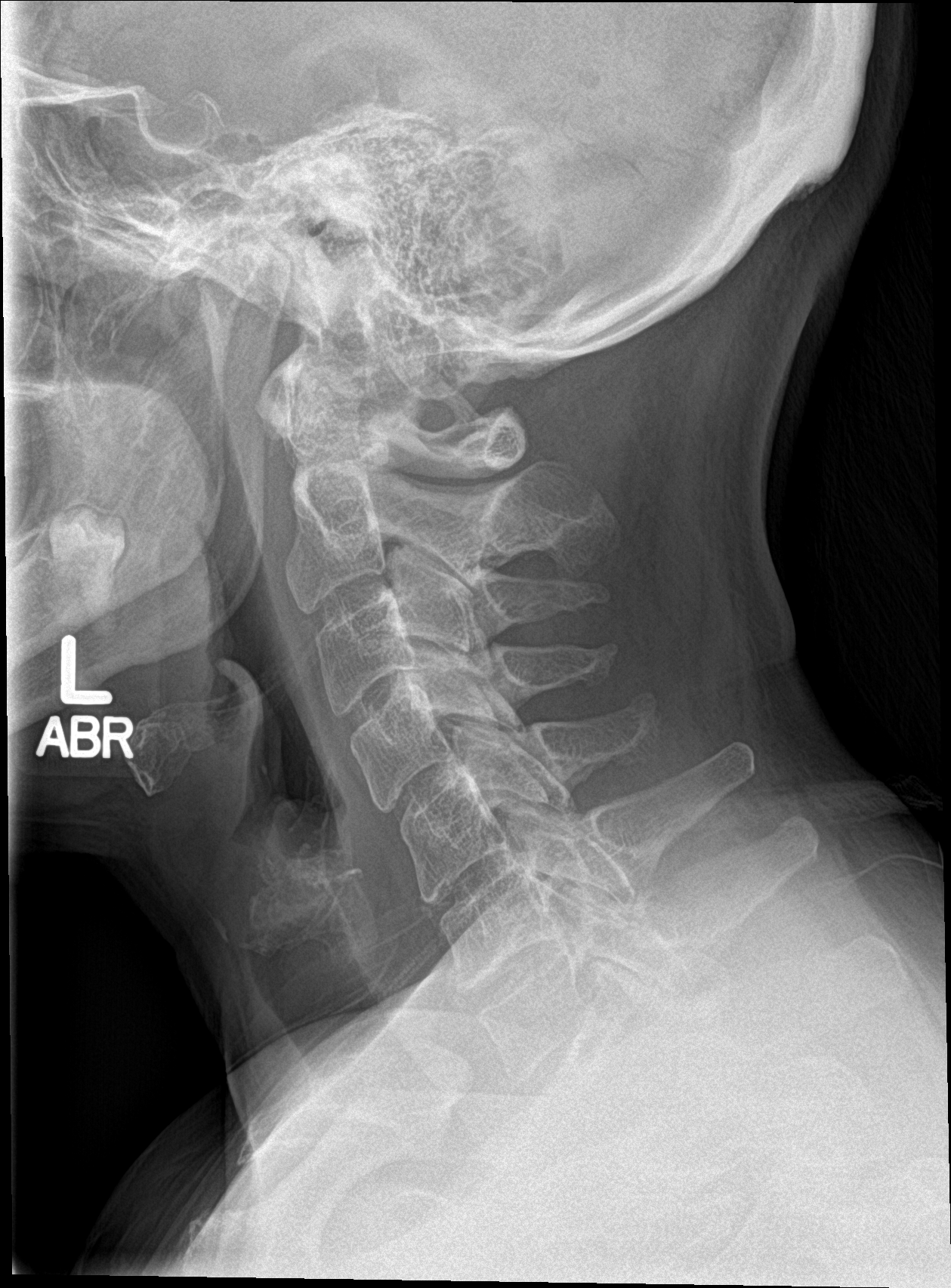

[c-spine obl (1 of 2)]
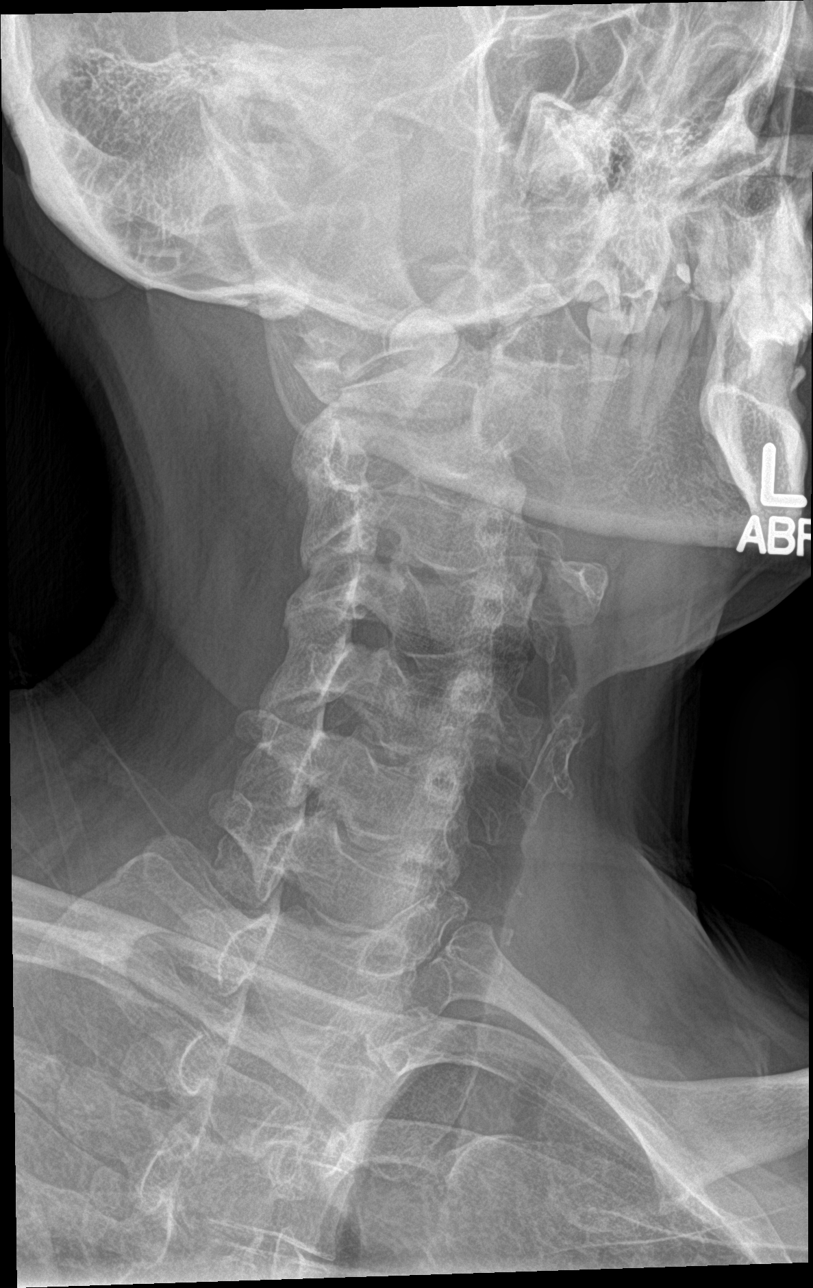

[c-spine obl (2 of 2)]
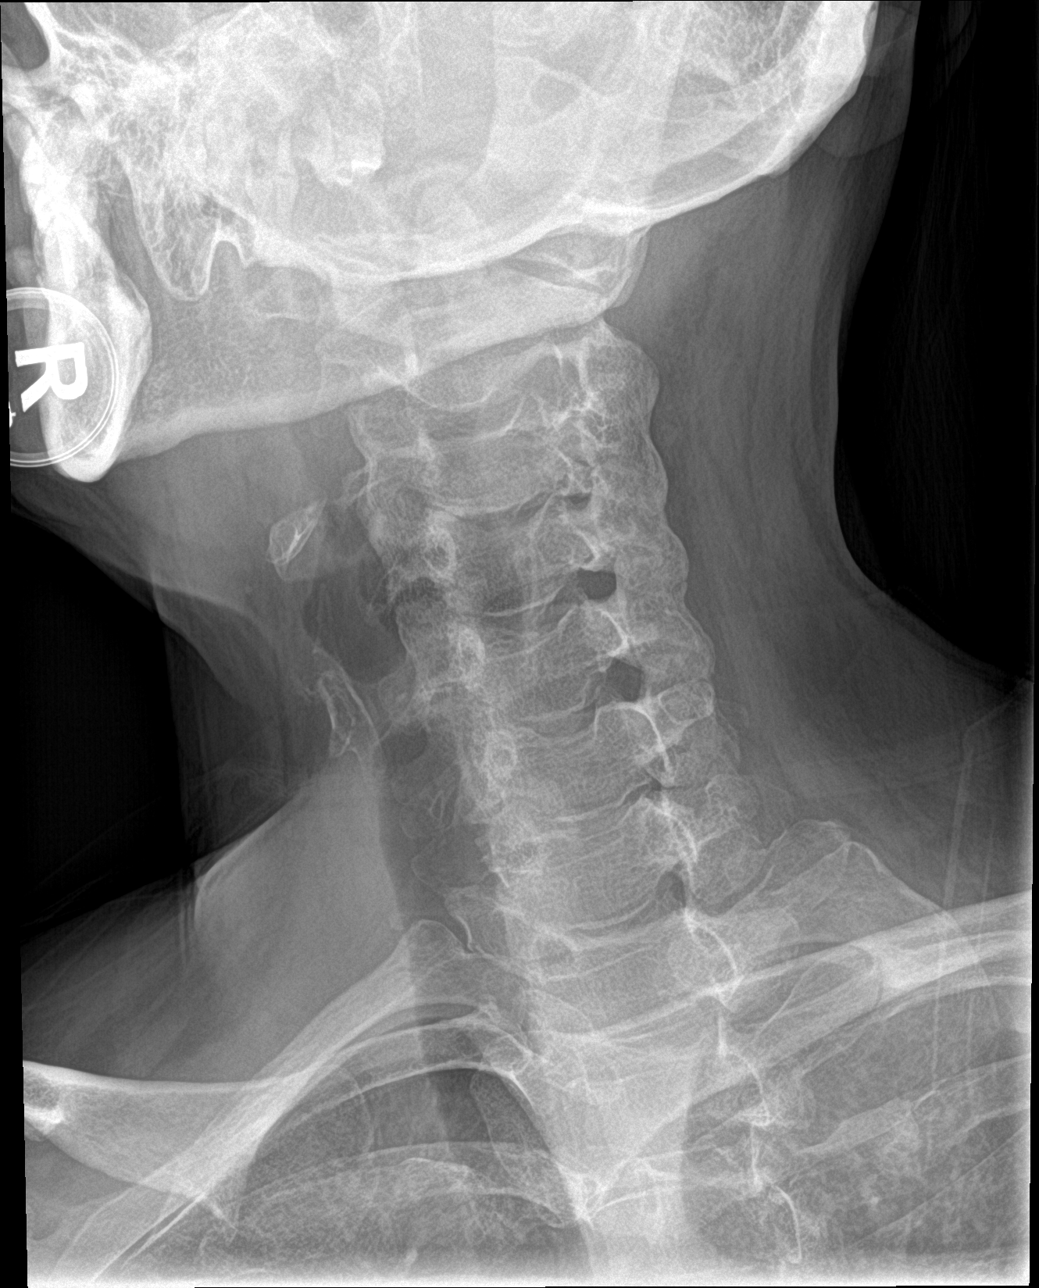

[c-spine ap]
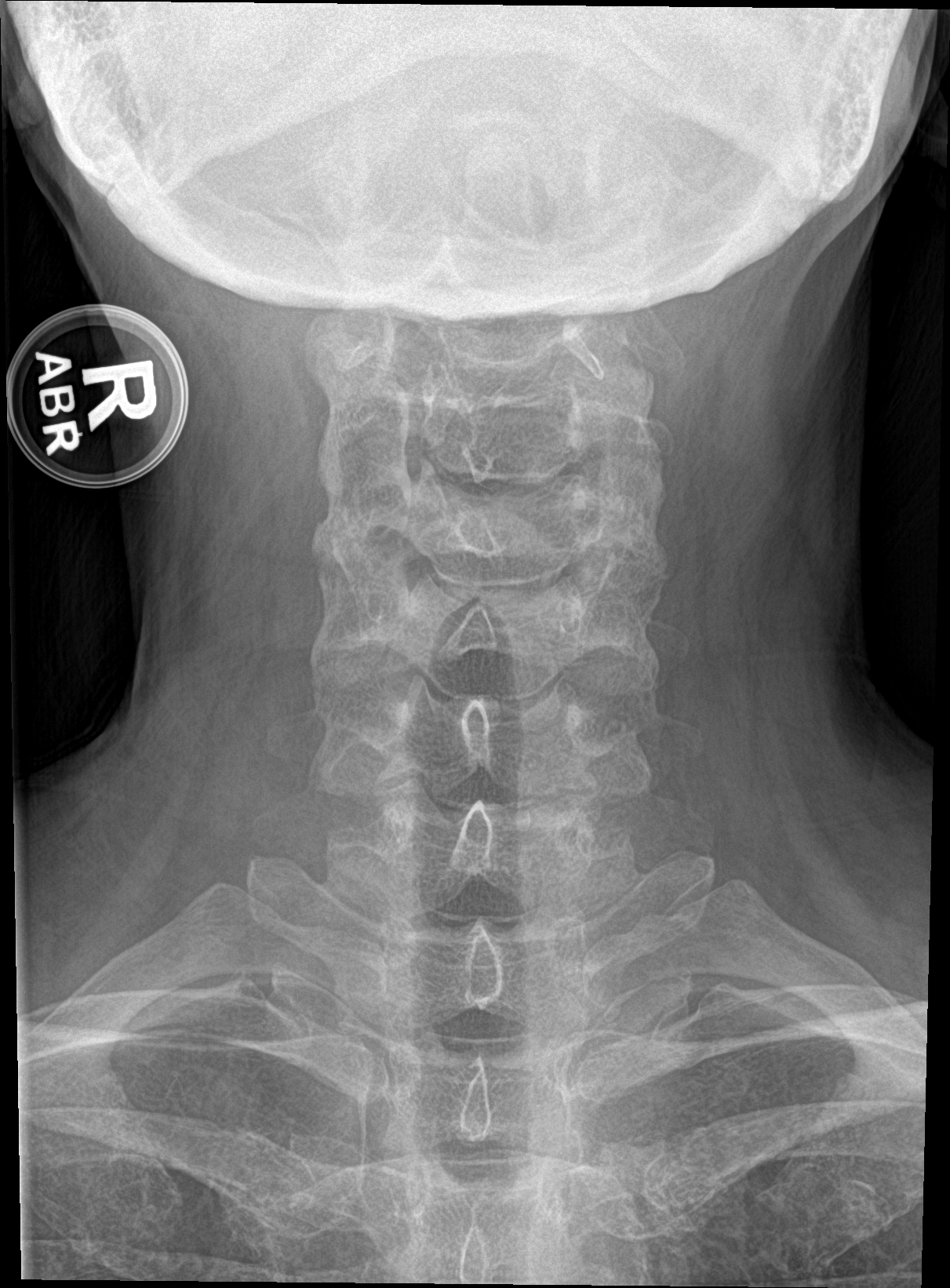

[c-spine open mouth]
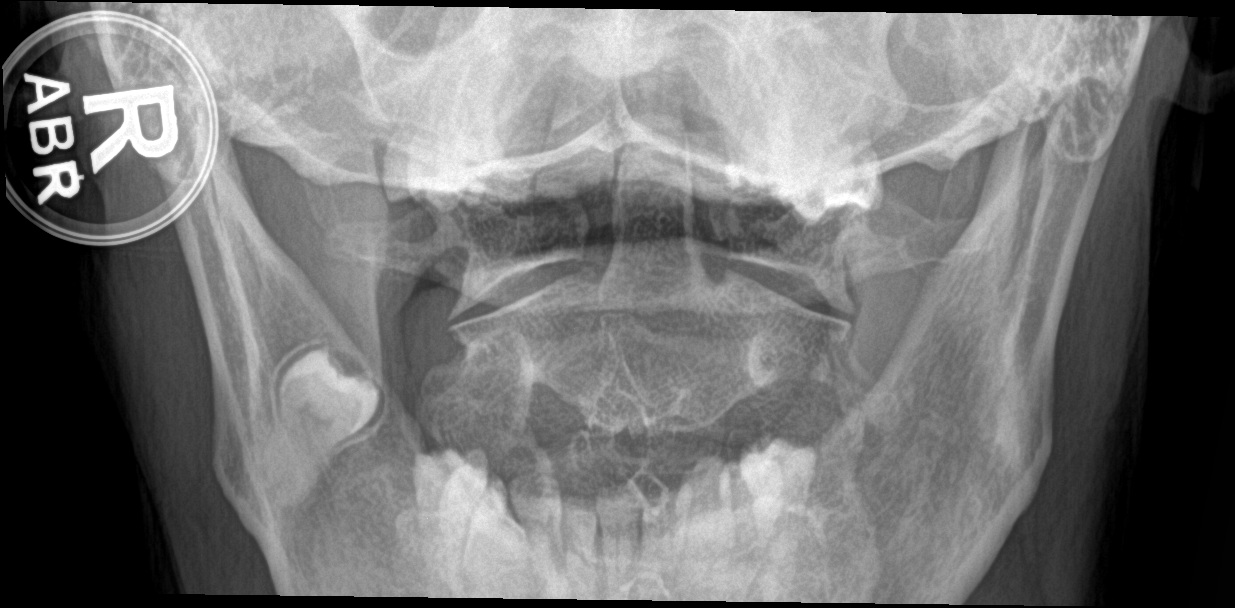

[5 of 5 positions shown; findings below may reference images not displayed]

FINDINGS: Prevertebral soft tissues normal thickness.

Vertebral body and disc space heights maintained.

Minimal endplate spurring C6-C7.

Osseous mineralization normal.

No acute fracture, subluxation, or bone destruction.

C6-C7 foramina appear narrowed bilaterally by uncovertebral spurs.

Lung apices clear.

C1-C2 alignment normal.
IMPRESSION: Mild degenerative disc disease changes at C6-C7.

No acute abnormalities.

## 2016-05-31 IMAGING — DX DG ANKLE COMPLETE 3+V*R*
3 series · 3 of 3 positions shown · non-contrast
Comparison: None.

CLINICAL DATA: Right ankle pain following fall from horse

EXAM:
RIGHT ANKLE - COMPLETE 3+ VIEW

[ankle ap]
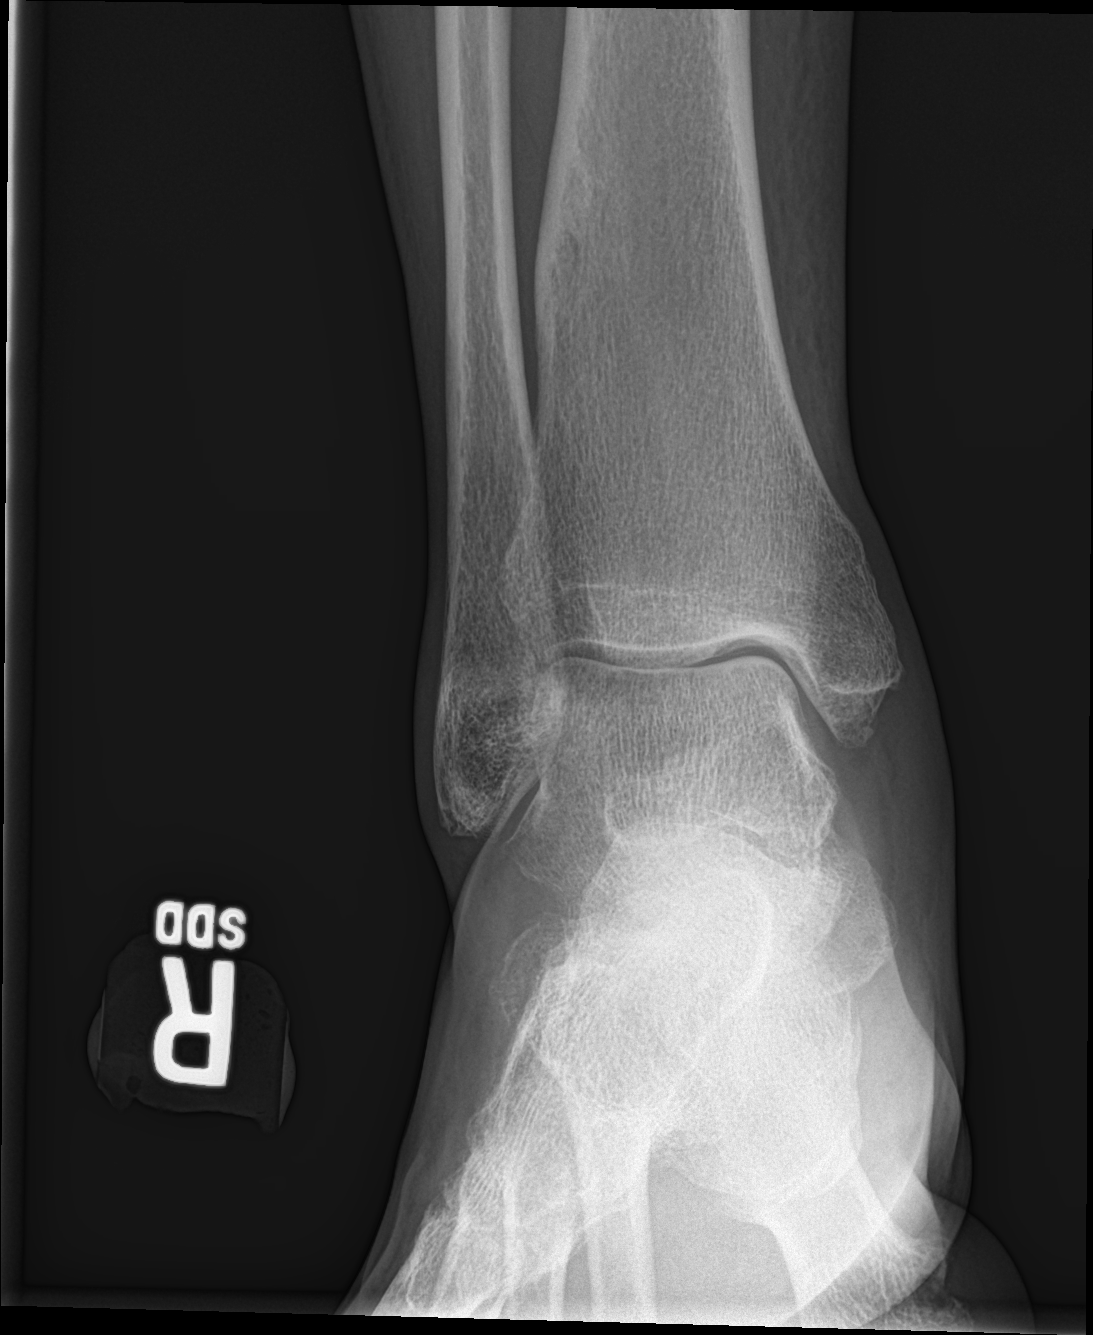

[ankle obl]
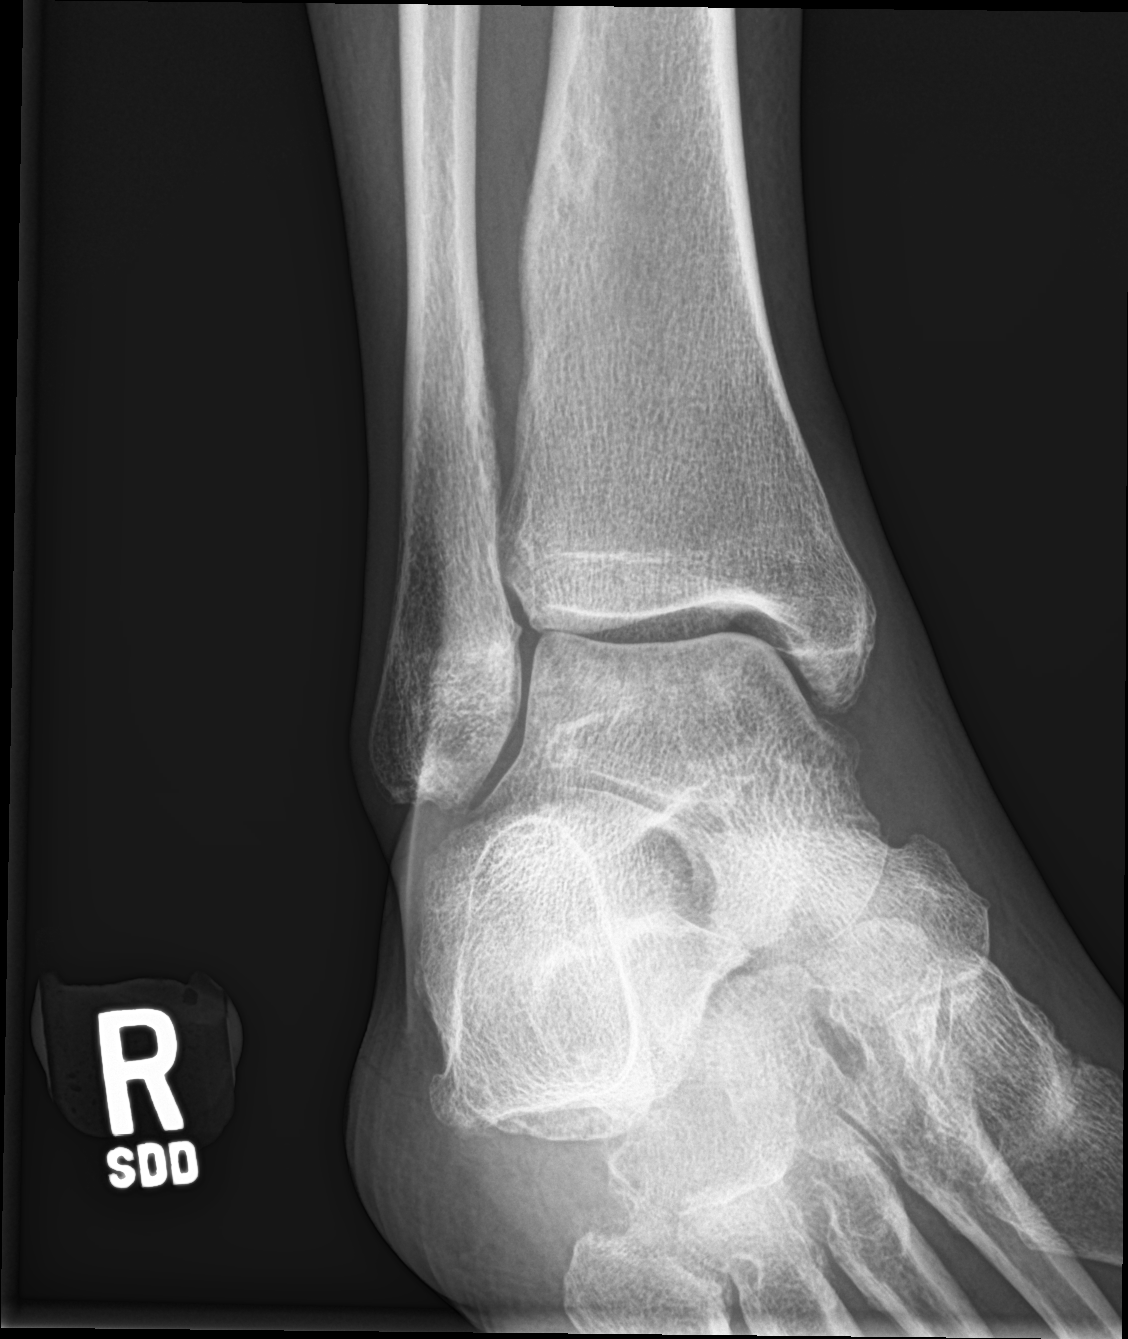

[ankle lat]
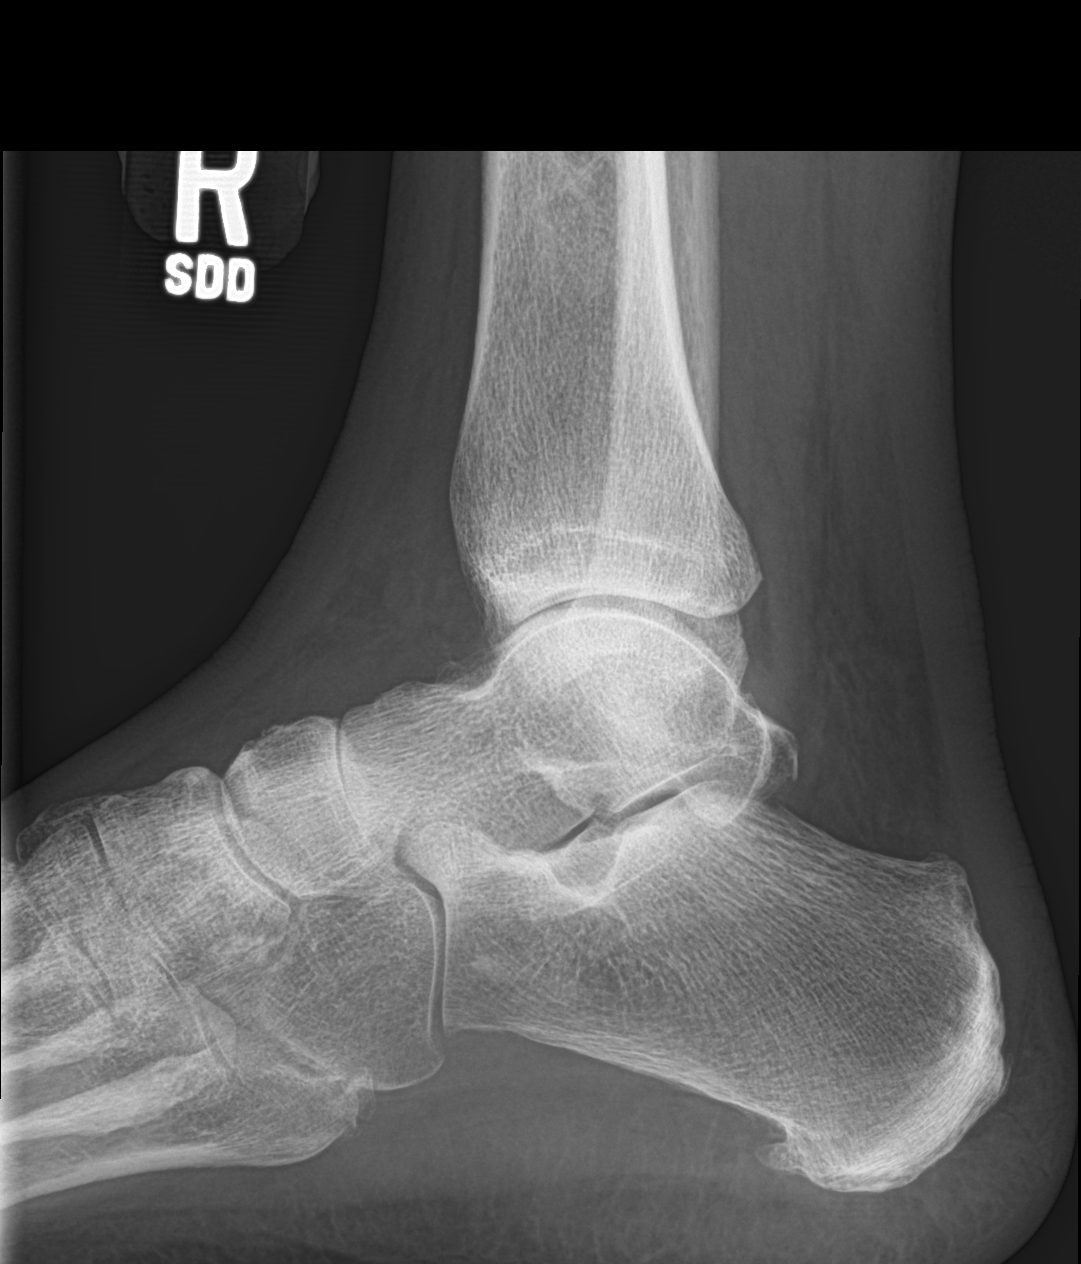

[3 of 3 positions shown; findings below may reference images not displayed]

FINDINGS: Benign fibrous cortical defect is again noted in the distal tibia.
No acute fracture dislocation is noted. Mild soft tissue swelling is
seen.
IMPRESSION: Mild soft tissue swelling without acute bony abnormality.

## 2016-06-04 IMAGING — CT CT ANGIO CHEST
2 of 6 series · 6 of 36 positions shown · IV contrast (Omnipaque 300)
Comparison: 04/09/2014

CLINICAL DATA: Right lower extremity pain and swelling since
falling from a horse on 05/19/2014. Now with dyspnea.

EXAM:
CT ANGIOGRAPHY CHEST WITH CONTRAST
TECHNIQUE: Multidetector CT imaging of the chest was performed using the
standard protocol during bolus administration of intravenous
contrast. Multiplanar CT image reconstructions and MIPs were
obtained to evaluate the vascular anatomy.
CONTRAST:  100mL OMNIPAQUE IOHEXOL 350 MG/ML SOLN

[Series 5: pe 3.0 b40f · axial · 0.62mm/px · z∈[+58,+250]mm · 5 of 97 slices shown]
[im 17/97  lung]
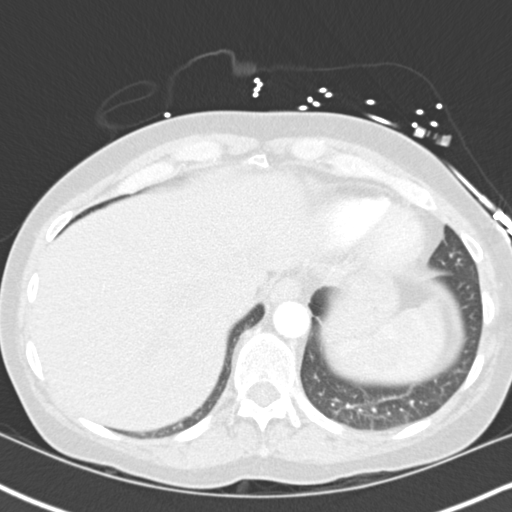
[im 33/97  mediastinal]
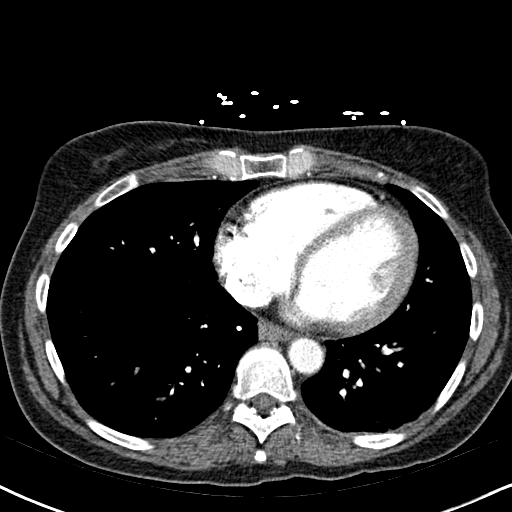
[im 49/97  lung]
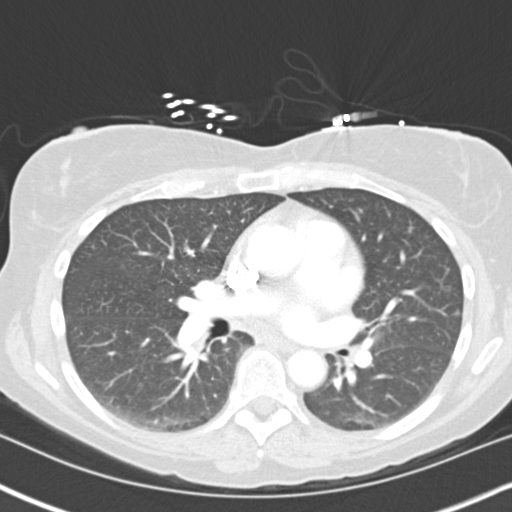
[im 65/97  mediastinal]
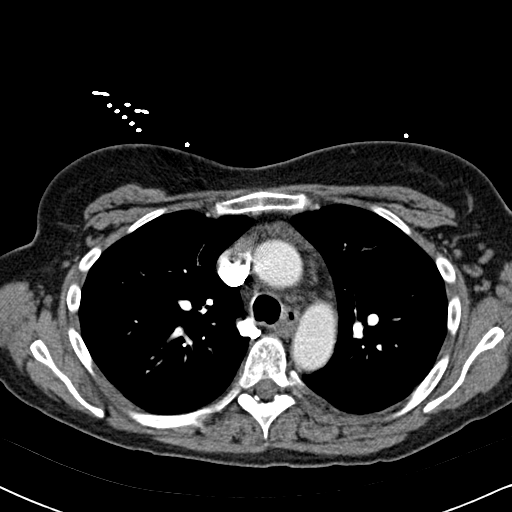
[im 81/97  lung]
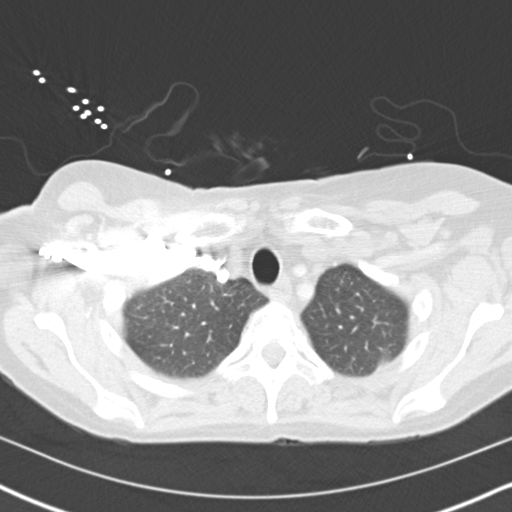

[Series 8: mpr coronal pe 3mm · coronal · 0.58mm/px · 1 of 71 slices shown]
[im 36/71  mediastinal]
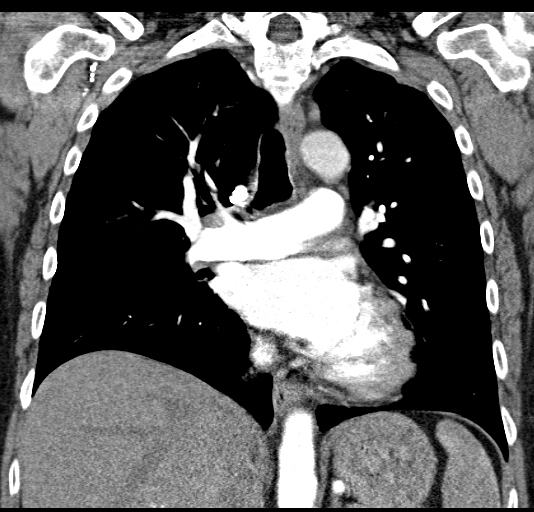

[6 of 36 positions shown; findings below may reference images not displayed]

FINDINGS: Cardiovascular: There is good opacification of the pulmonary
arteries. There is no pulmonary embolism. The thoracic aorta is
normal in caliber and intact.

Lungs: Clear

Central airways: Patent

Effusions: None

Lymphadenopathy: None

Esophagus: Normal

Upper abdomen: No significant abnormality

Musculoskeletal: No significant abnormality

Review of the MIP images confirms the above findings.
IMPRESSION: Negative for pulmonary embolism.  No significant abnormality.

## 2016-07-01 IMAGING — DX DG KNEE COMPLETE 4+V*L*
4 series · 4 of 4 positions shown · non-contrast
Comparison: 04/04/2014.

CLINICAL DATA: Twisted LEFT knee earlier today.  Pain.

EXAM:
LEFT KNEE - COMPLETE 4+ VIEW

[knee ap]
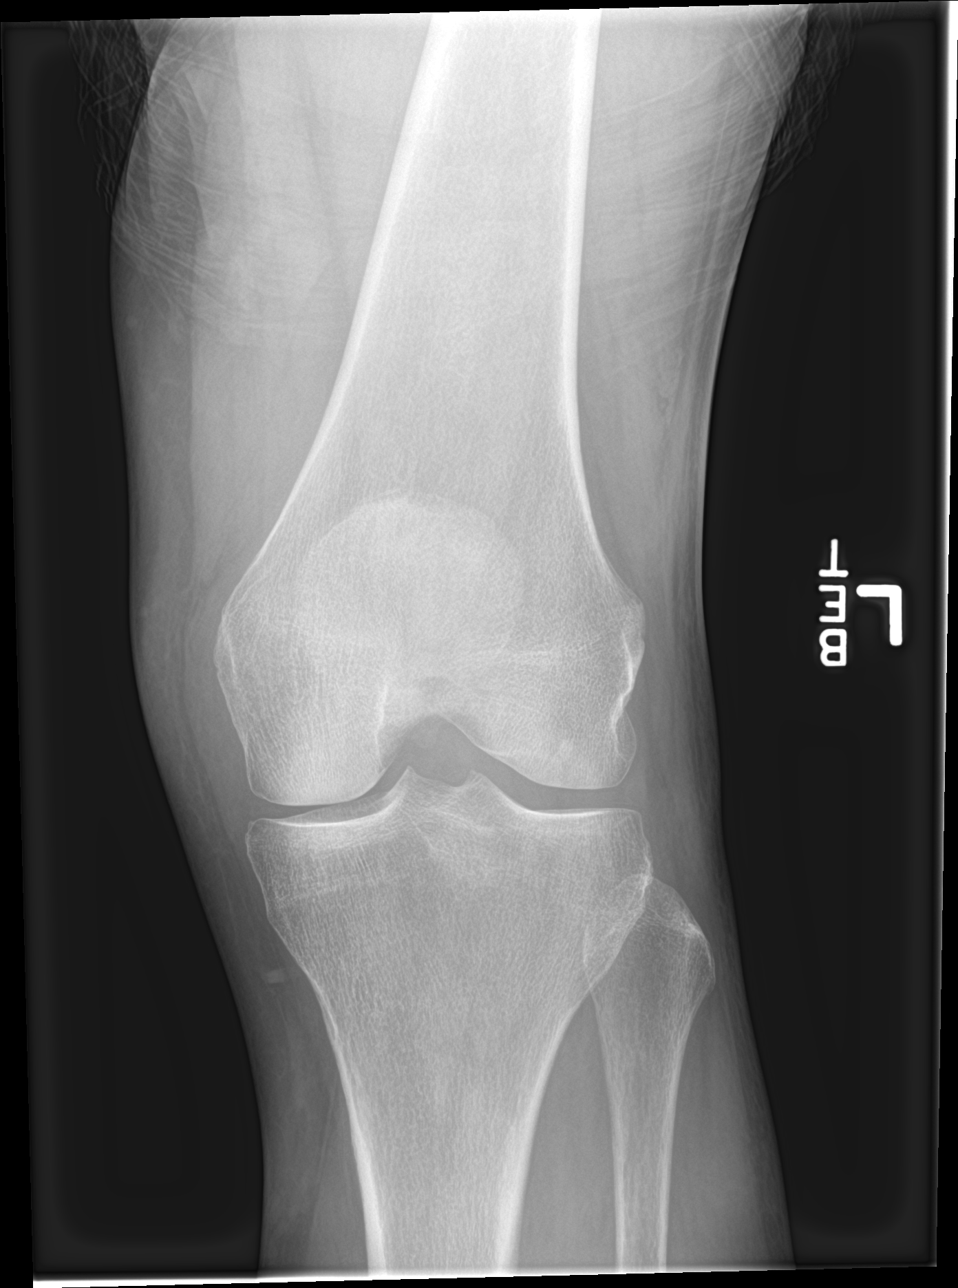

[knee obl (1 of 2)]
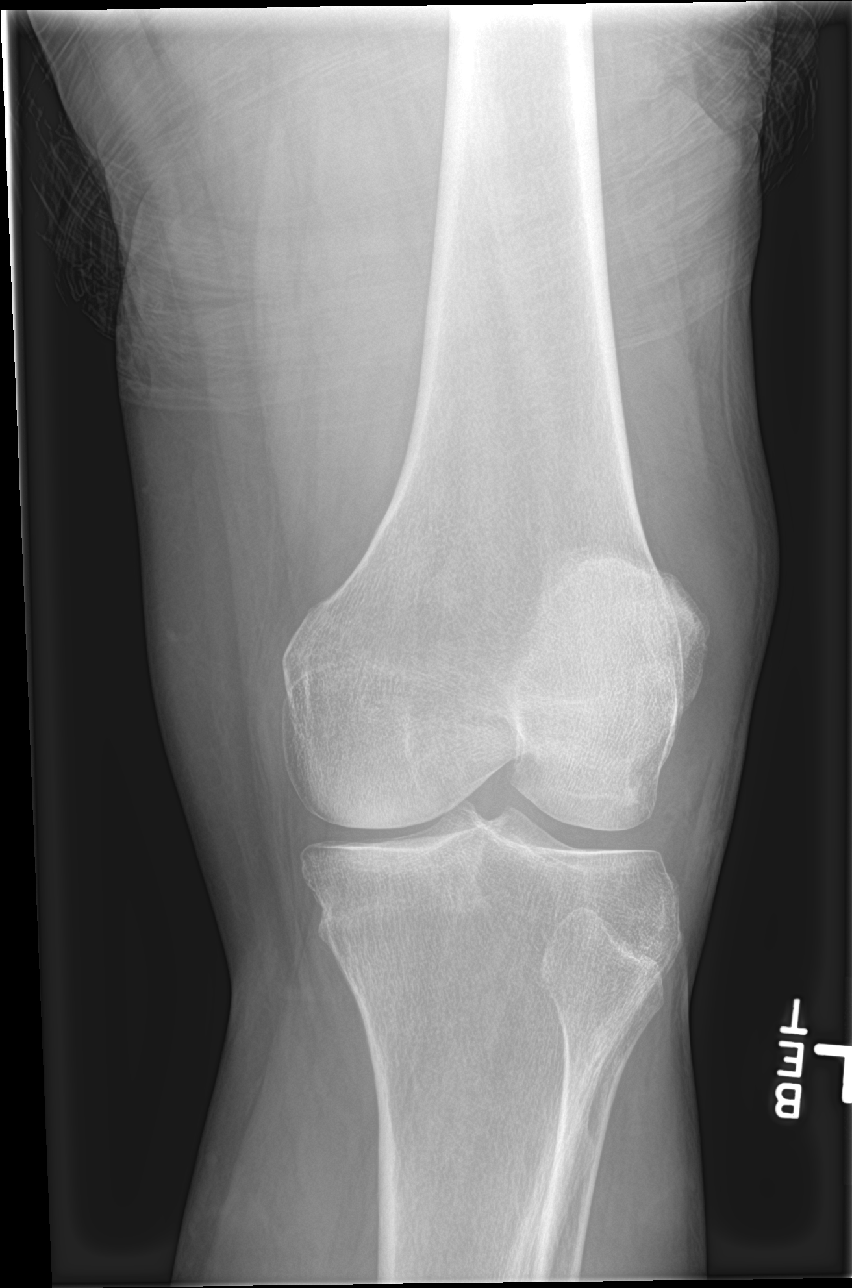

[knee obl (2 of 2)]
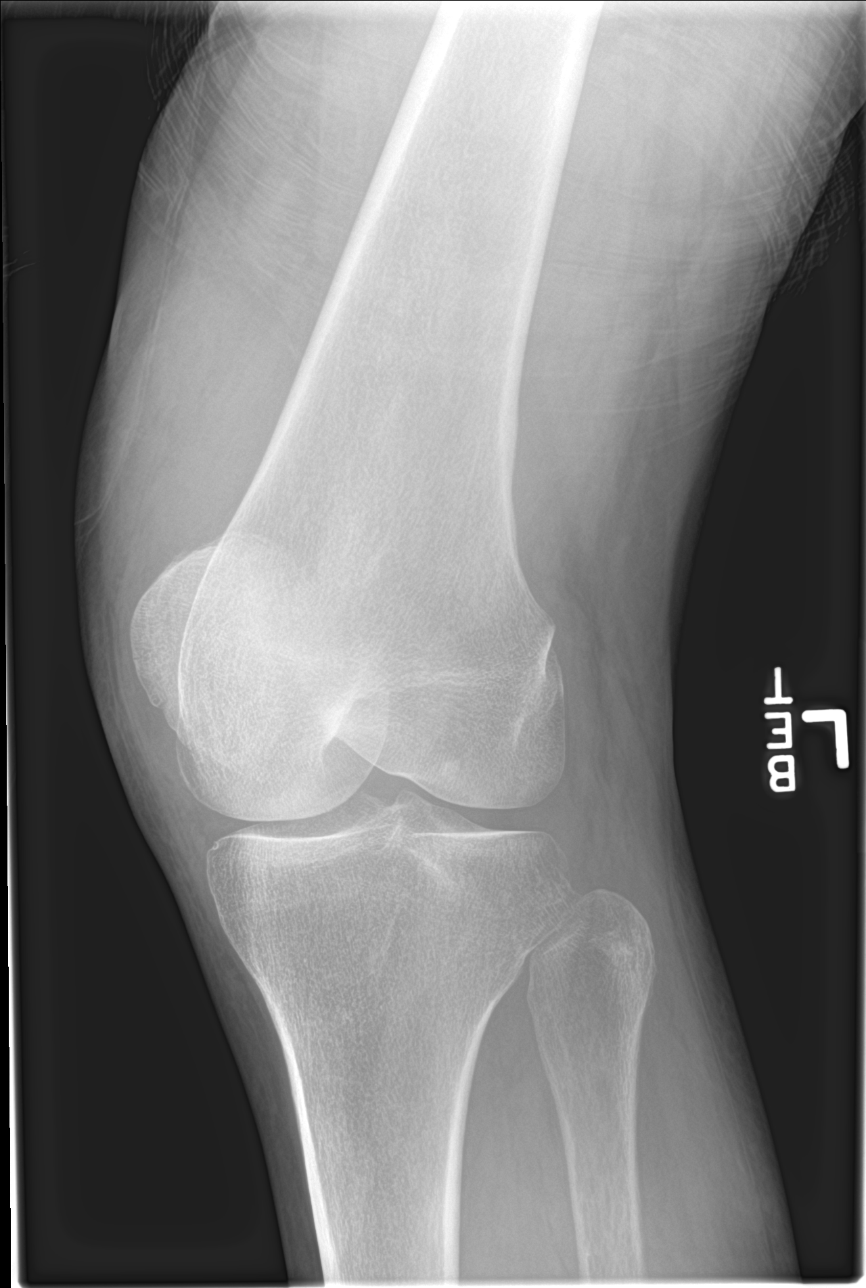

[knee lat]
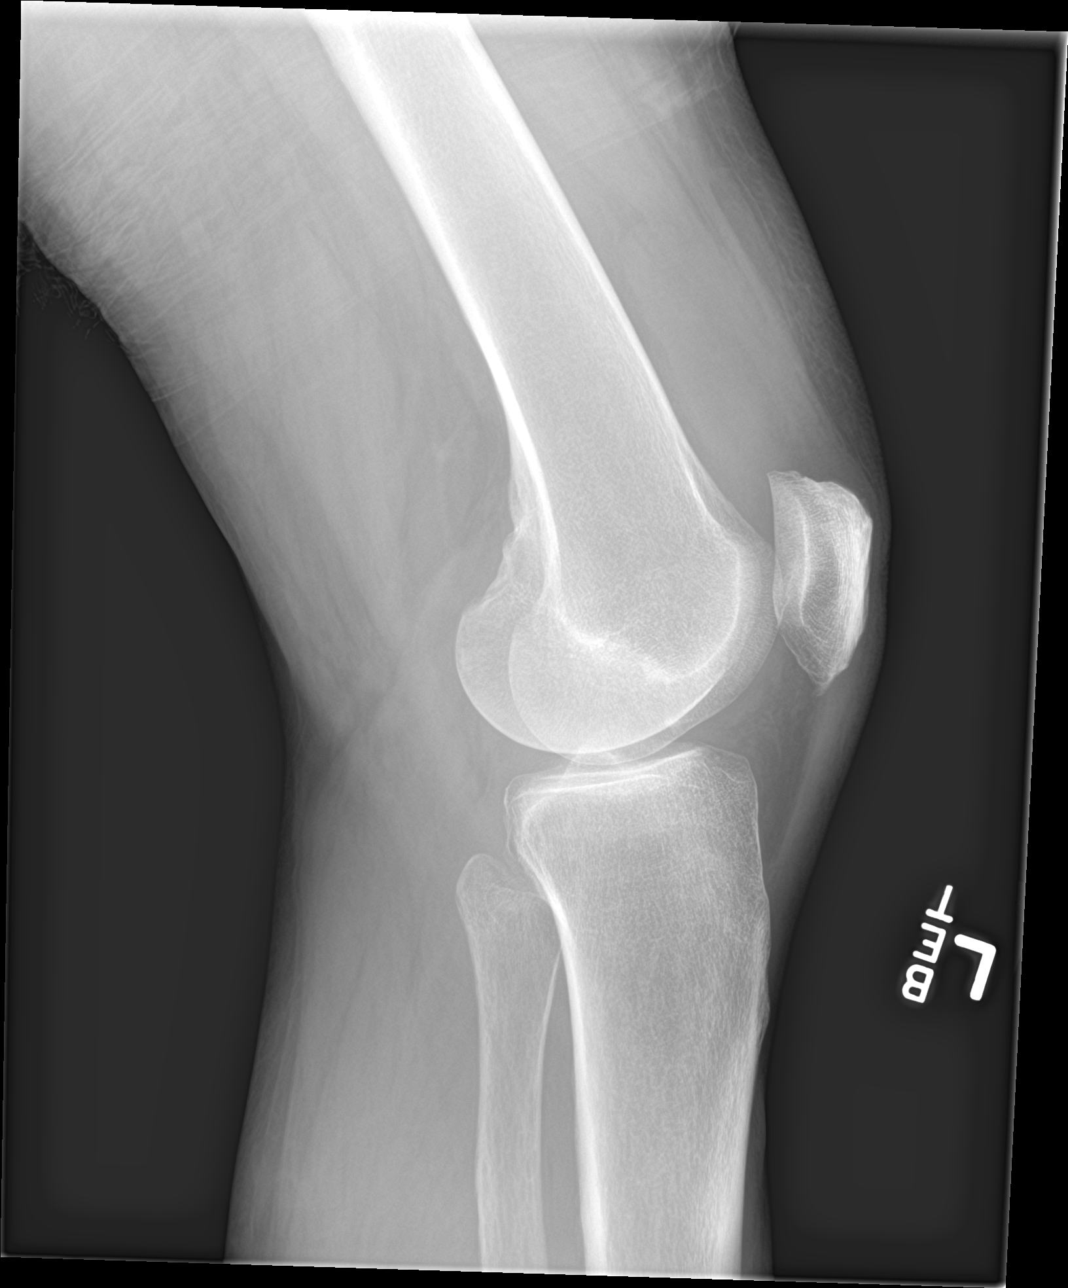

[4 of 4 positions shown; findings below may reference images not displayed]

FINDINGS: There is no evidence of fracture, or dislocation. There is a small
joint effusion. There is no evidence of arthropathy or other focal
bone abnormality. Soft tissues are unremarkable.
IMPRESSION: Small joint effusion.  No acute findings.

## 2016-07-01 IMAGING — DX DG FOOT COMPLETE 3+V*L*
3 series · 3 of 3 positions shown · non-contrast
Comparison: None.

CLINICAL DATA: Twisting injury to left knee and foot. The injury
occurred earlier today. Initial encounter.

EXAM:
LEFT FOOT - COMPLETE 3+ VIEW

[foot ap]
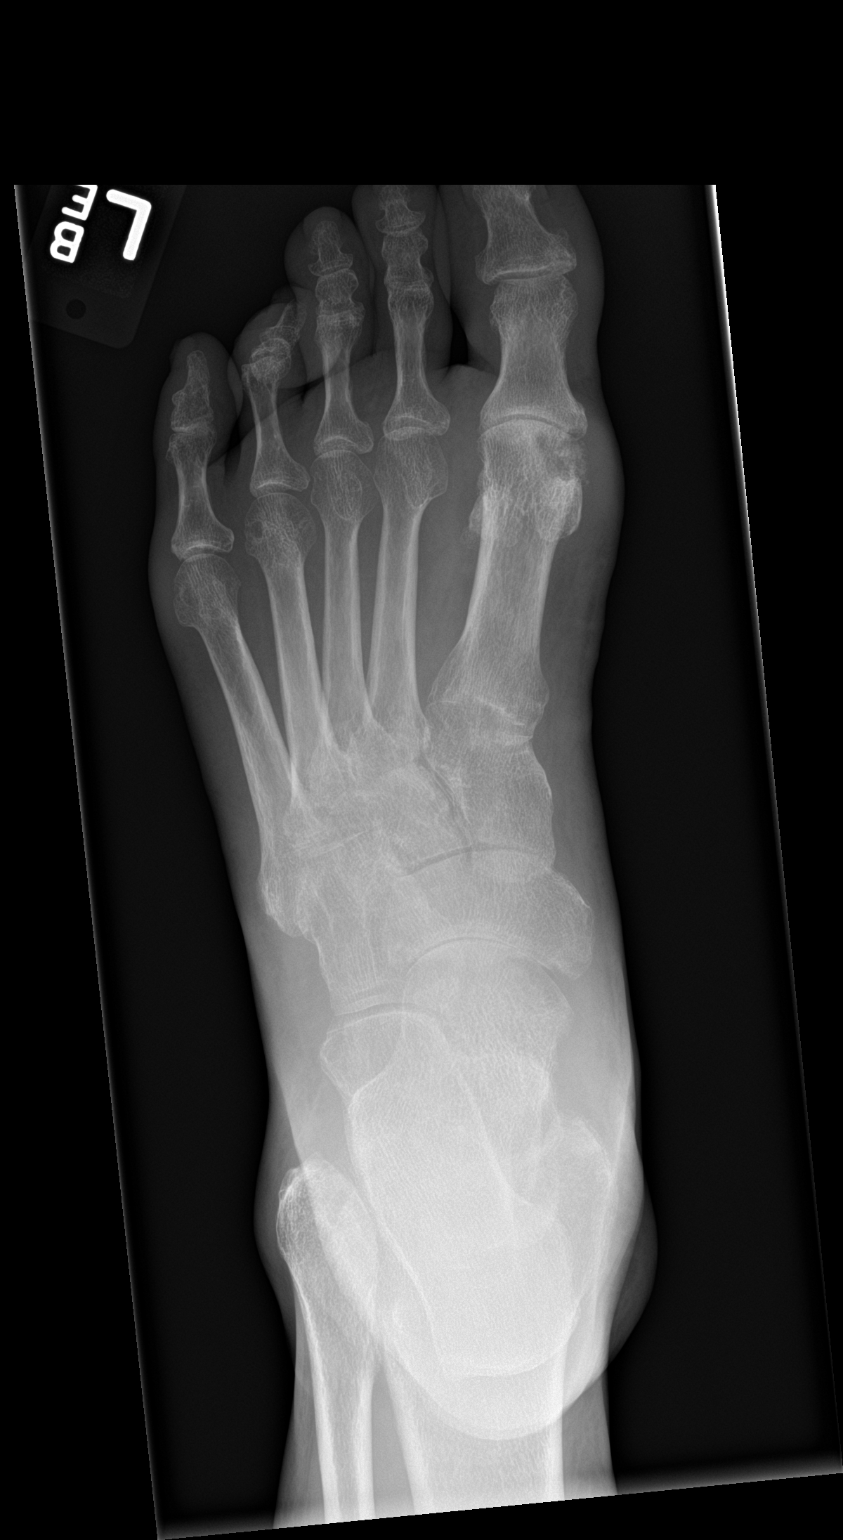

[foot obl]
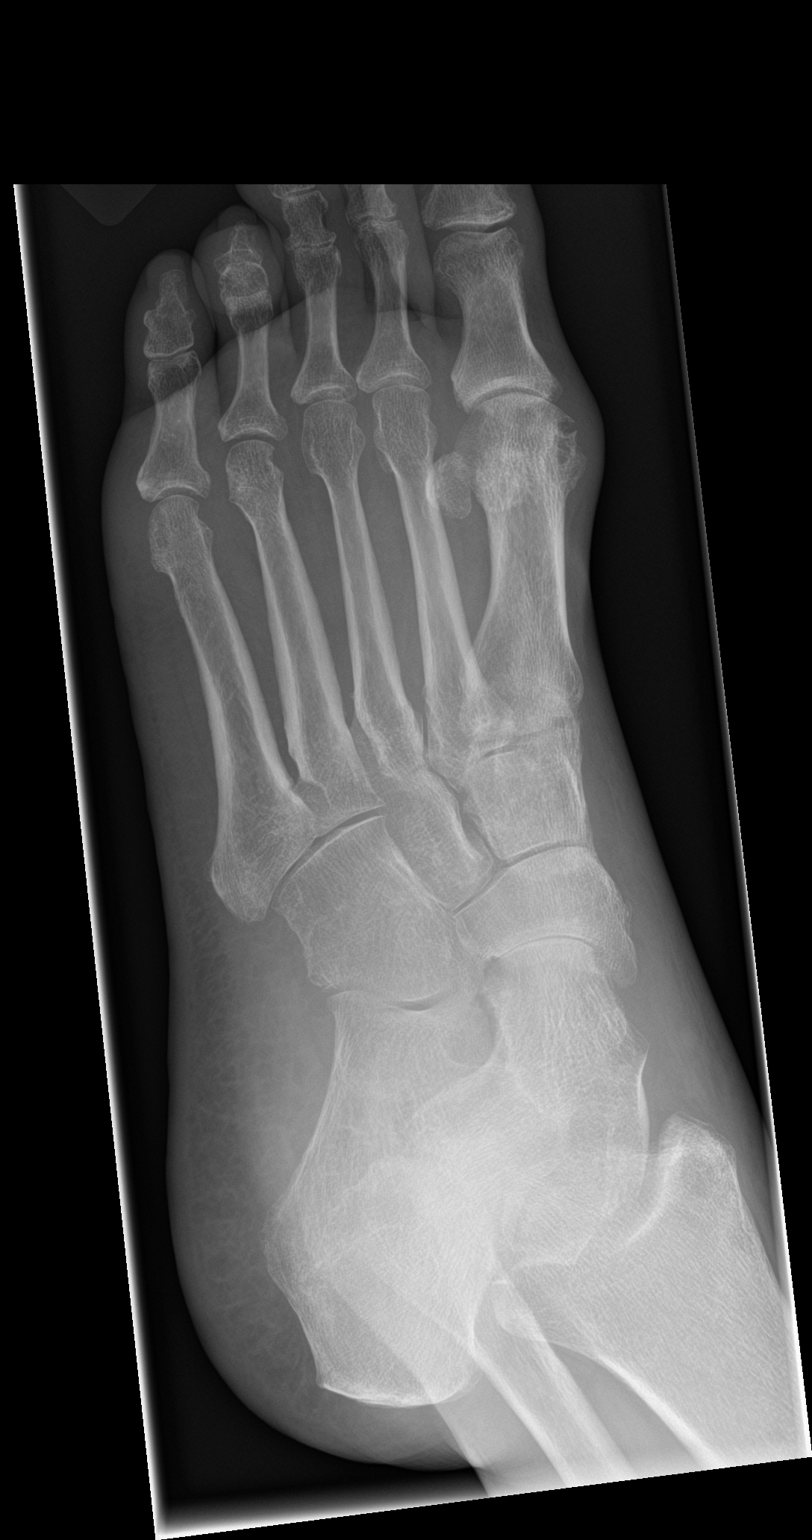

[foot lat]
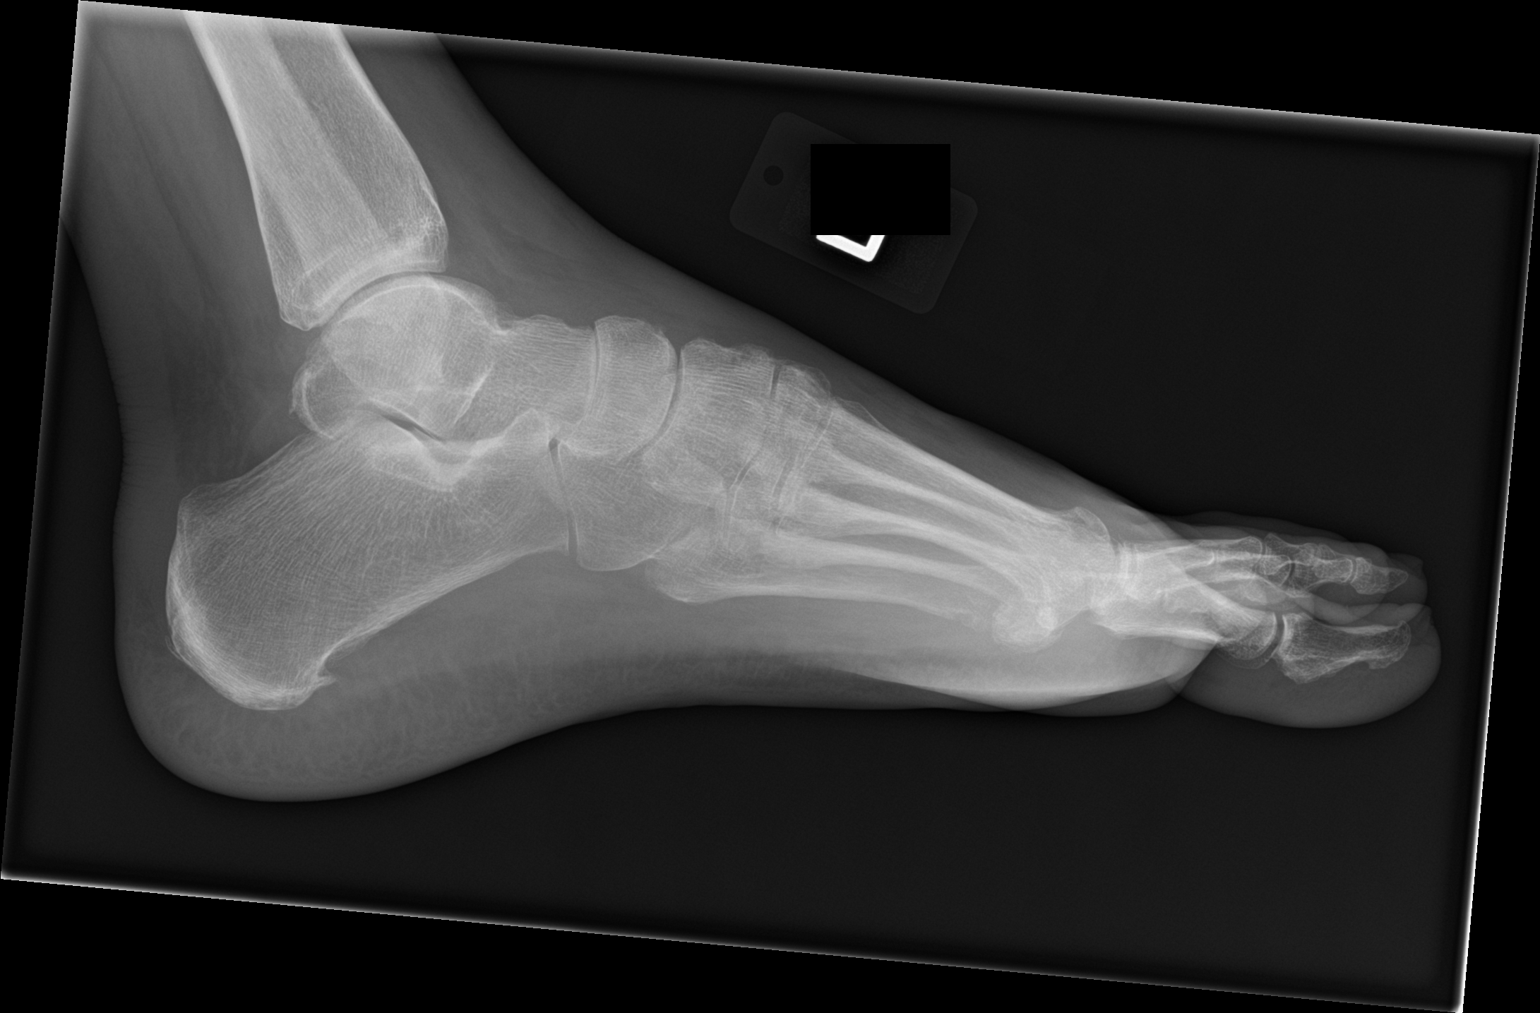

[3 of 3 positions shown; findings below may reference images not displayed]

FINDINGS: Negative for fracture, dislocation or radiopaque foreign body.
Moderate arthropathic changes are present about the first MTP joint
with appearances suggesting gout.
IMPRESSION: Negative for acute traumatic injury. Probable gouty arthropathy of
the first MTP joint

## 2016-07-16 IMAGING — CT CT ABD-PELV W/ CM
2 of 5 series · 16 of 46 positions shown, 18 images · IV contrast (Omnipaque 300)
Comparison: 11/20/2013.

CLINICAL DATA: Initial encounter for bloody stools beginning this
morning.

EXAM:
CT ABDOMEN AND PELVIS WITH CONTRAST
TECHNIQUE: Multidetector CT imaging of the abdomen and pelvis was performed
using the standard protocol following bolus administration of
intravenous contrast.
CONTRAST:  50mL OMNIPAQUE IOHEXOL 300 MG/ML SOLN, 100mL OMNIPAQUE
IOHEXOL 300 MG/ML SOLN

[Series 2: abd_pel_with 5.0 b40f · axial · 0.80mm/px · z∈[-417,-27]mm · 13 of 90 slices shown, 15 images]
[im 6/90  soft-tissue]
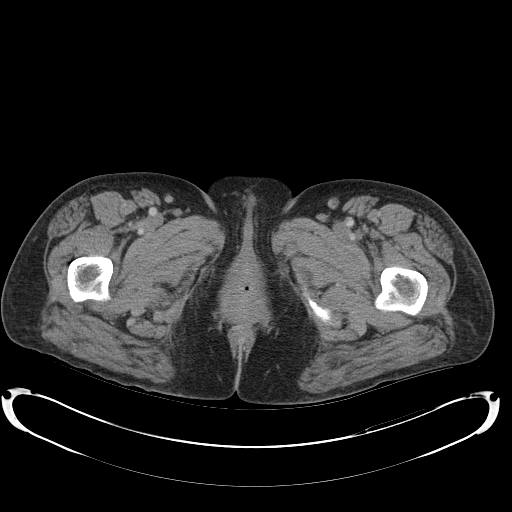
[im 6/90  bone]
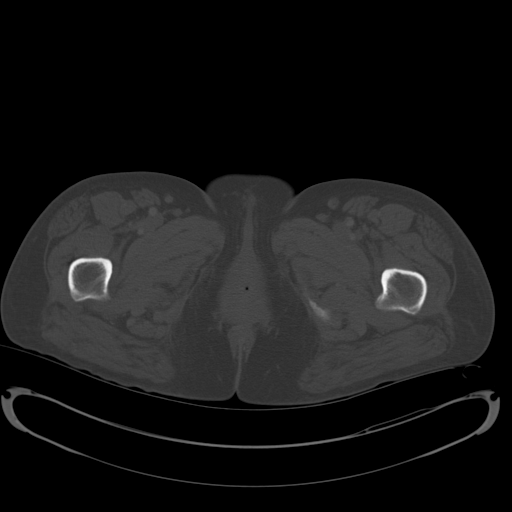
[im 11/90  soft-tissue]
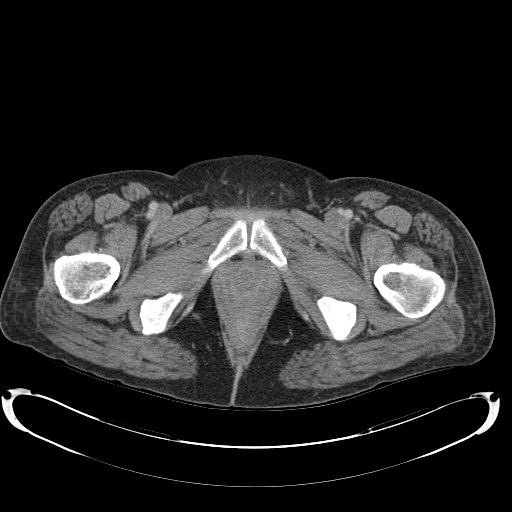
[im 21/90  soft-tissue]
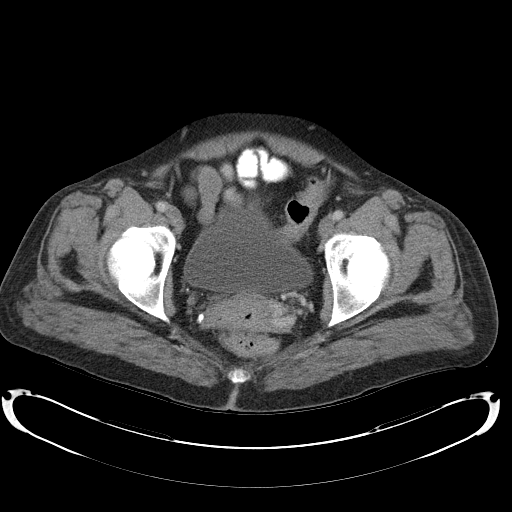
[im 27/90  soft-tissue]
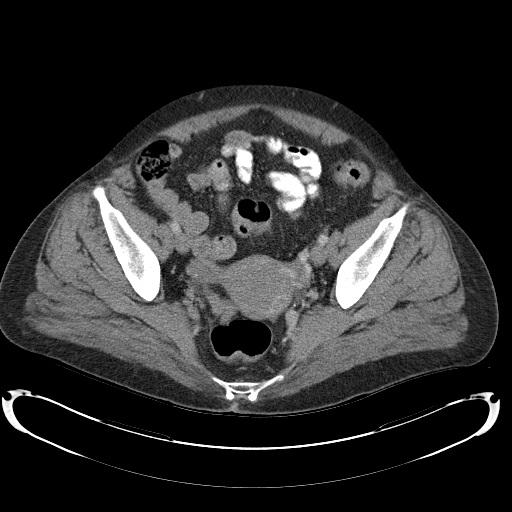
[im 32/90  soft-tissue]
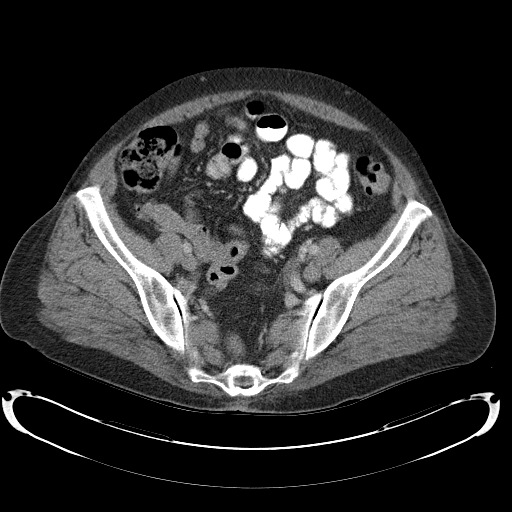
[im 37/90  soft-tissue]
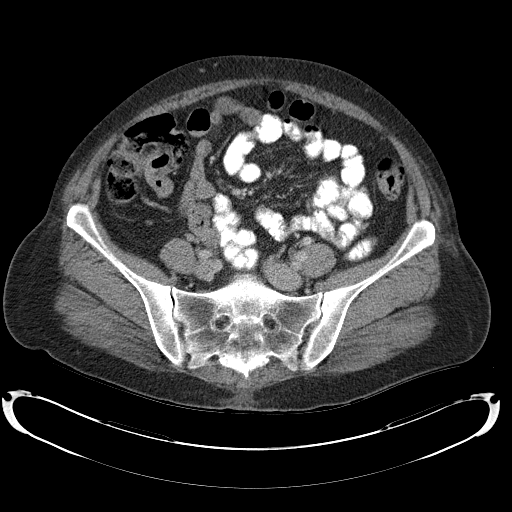
[im 48/90  soft-tissue]
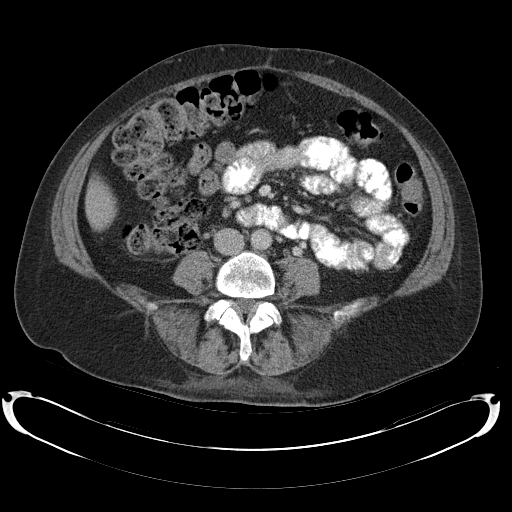
[im 53/90  soft-tissue]
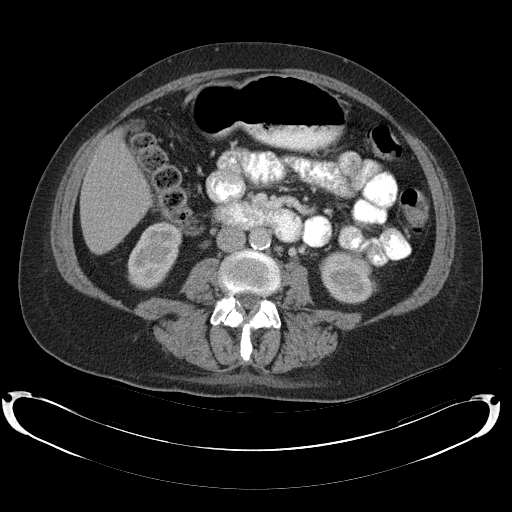
[im 58/90  soft-tissue]
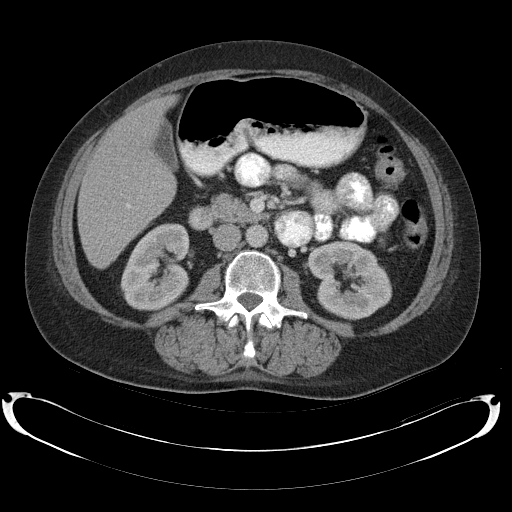
[im 58/90  bone]
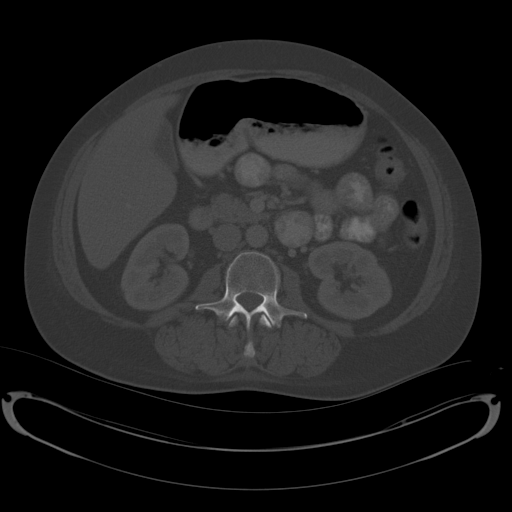
[im 63/90  soft-tissue]
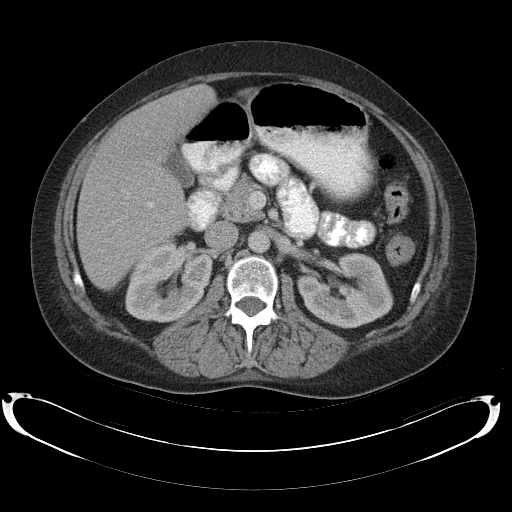
[im 69/90  soft-tissue]
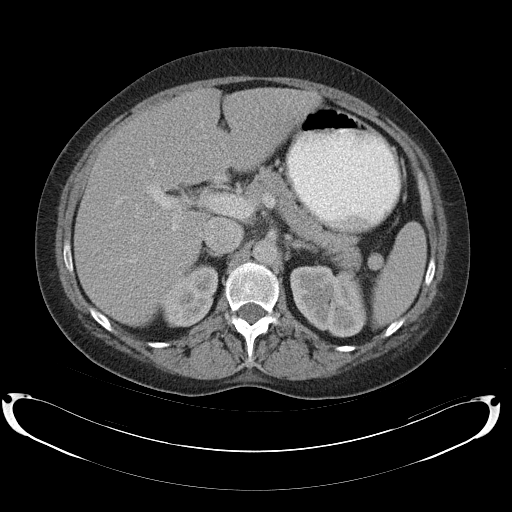
[im 79/90  soft-tissue]
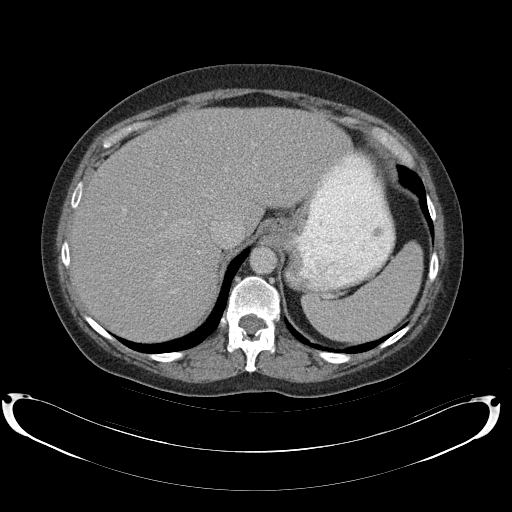
[im 84/90  soft-tissue]
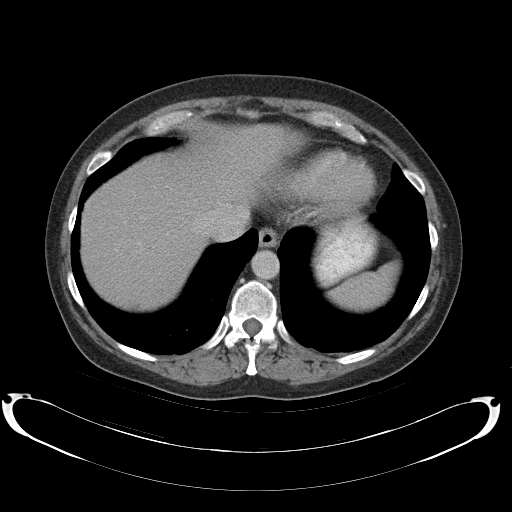

[Series 3: abd_pel_with 3.0 spo cor · coronal · 0.81mm/px · 3 of 108 slices shown]
[im 36/108  soft-tissue]
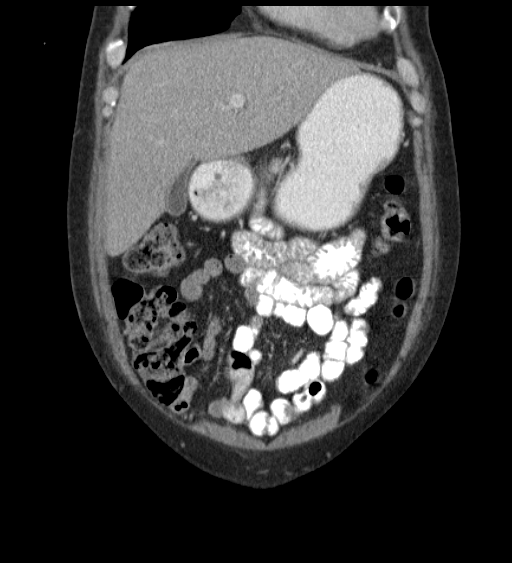
[im 48/108  soft-tissue]
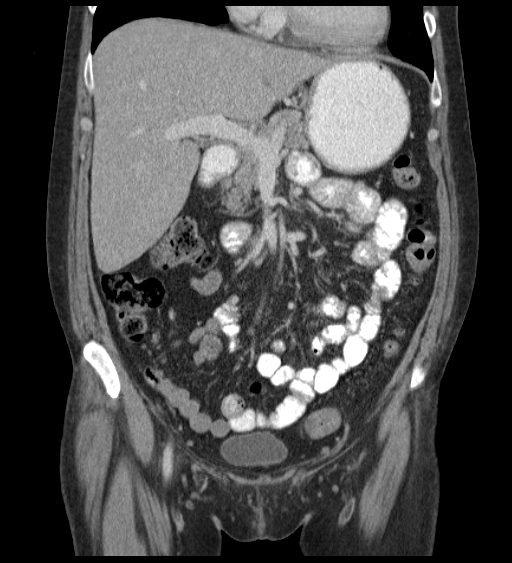
[im 60/108  soft-tissue]
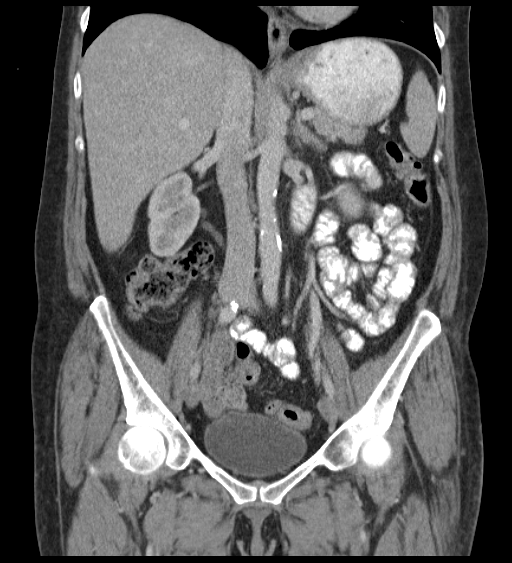

[16 of 46 positions shown; findings below may reference images not displayed]

FINDINGS: Lower chest:  Unremarkable.

Hepatobiliary: No focal abnormality within the liver parenchyma.
There is no evidence for gallstones, gallbladder wall thickening, or
pericholecystic fluid. No intrahepatic or extrahepatic biliary
dilation.

Pancreas: No focal mass lesion. No dilatation of the main duct. No
intraparenchymal cyst. No peripancreatic edema.

Spleen: No splenomegaly. No focal mass lesion.

Adrenals/Urinary Tract: No adrenal nodule or mass. Kidneys are
unremarkable without enhancing lesion or hydronephrosis. No evidence
for hydroureter. Urinary bladder is normal in appearance.

Stomach/Bowel: Stomach is nondistended. No gastric wall thickening.
No evidence of outlet obstruction. Duodenum is normally positioned
as is the ligament of Treitz. No small bowel wall thickening. No
small bowel dilatation. Terminal ileum normal. Appendix normal. No
gross colonic mass. No colonic wall thickening. No substantial
diverticular change.

Vascular/Lymphatic: There is abdominal aortic atherosclerosis
without aneurysm. No evidence for lymphadenopathy in the abdomen. No
pelvic sidewall lymphadenopathy.

Reproductive: Uterus is normal.  No adnexal mass.

Other: No intraperitoneal free fluid.

Musculoskeletal: Bone windows reveal no worrisome lytic or sclerotic
osseous lesions.
IMPRESSION: No acute findings in the abdomen or pelvis. Specifically, no
findings to explain the patient's history of bloody stools with
abdominal bloating.

Imaging findings of potential clinical significance:

Abdominal aortic atherosclerosis.

## 2016-08-05 IMAGING — DX DG SACRUM/COCCYX 2+V
3 series · 3 of 3 positions shown · non-contrast
Comparison: None.

CLINICAL DATA: Fall down stairs today with sacral pain. Initial
encounter.

EXAM:
SACRUM AND COCCYX - 2+ VIEW

[sacrum ap]
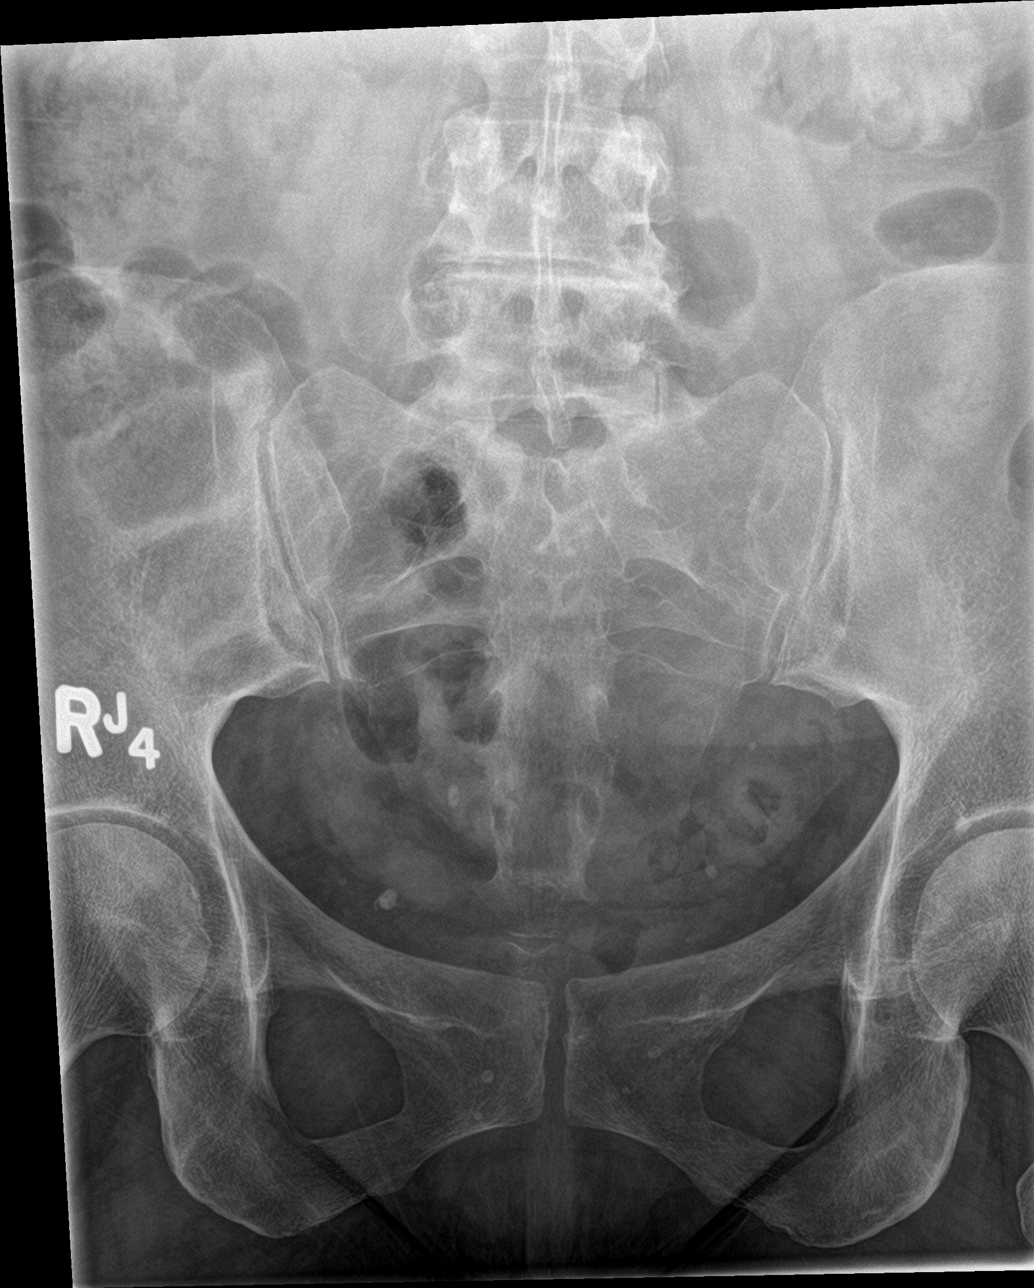

[coccyx ap]
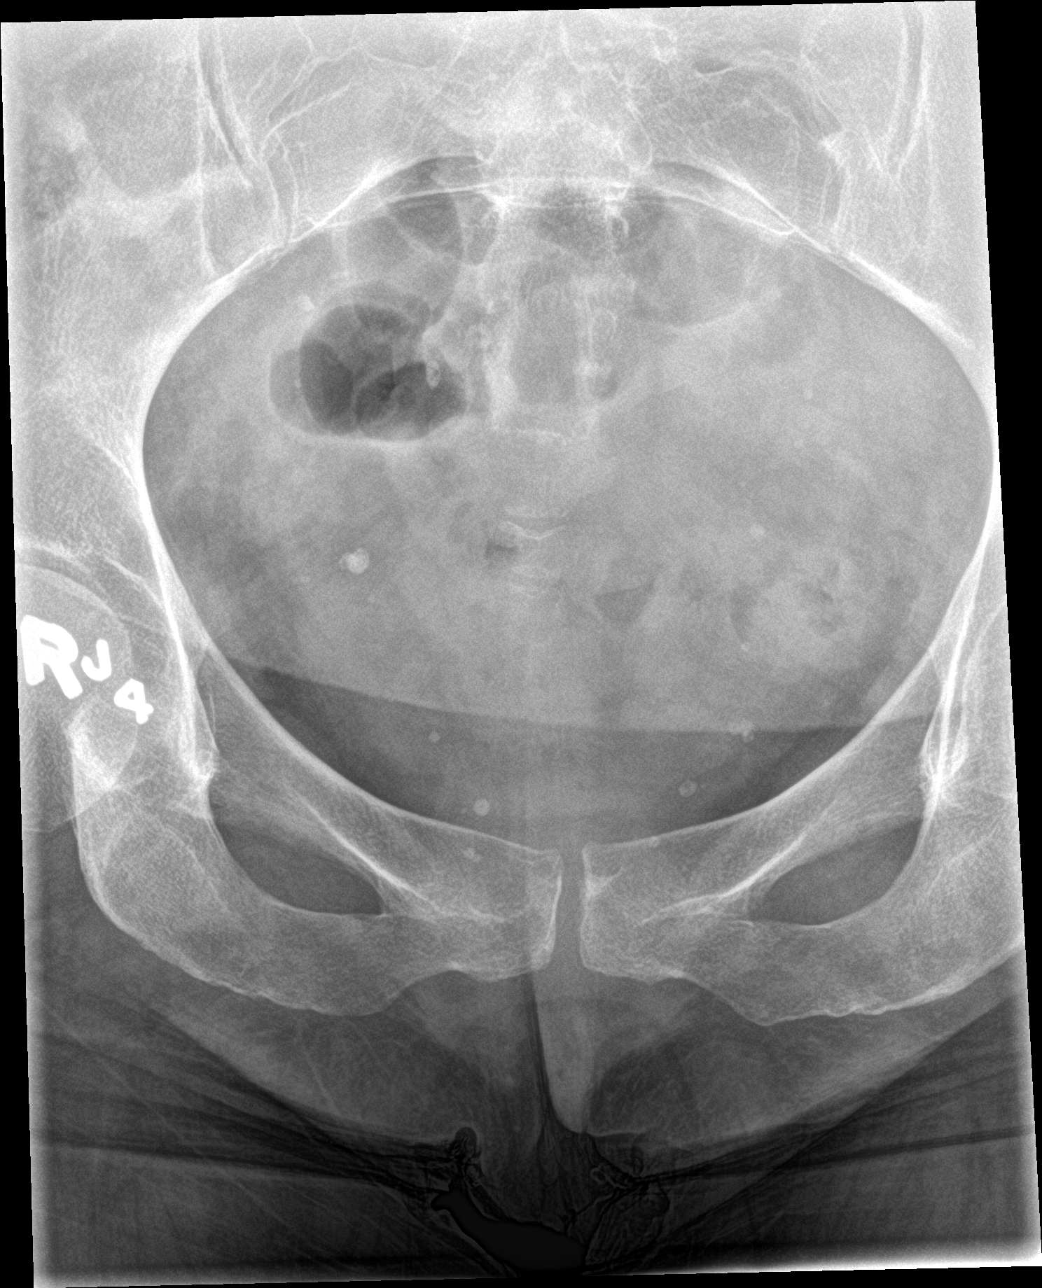

[sacrum lat]
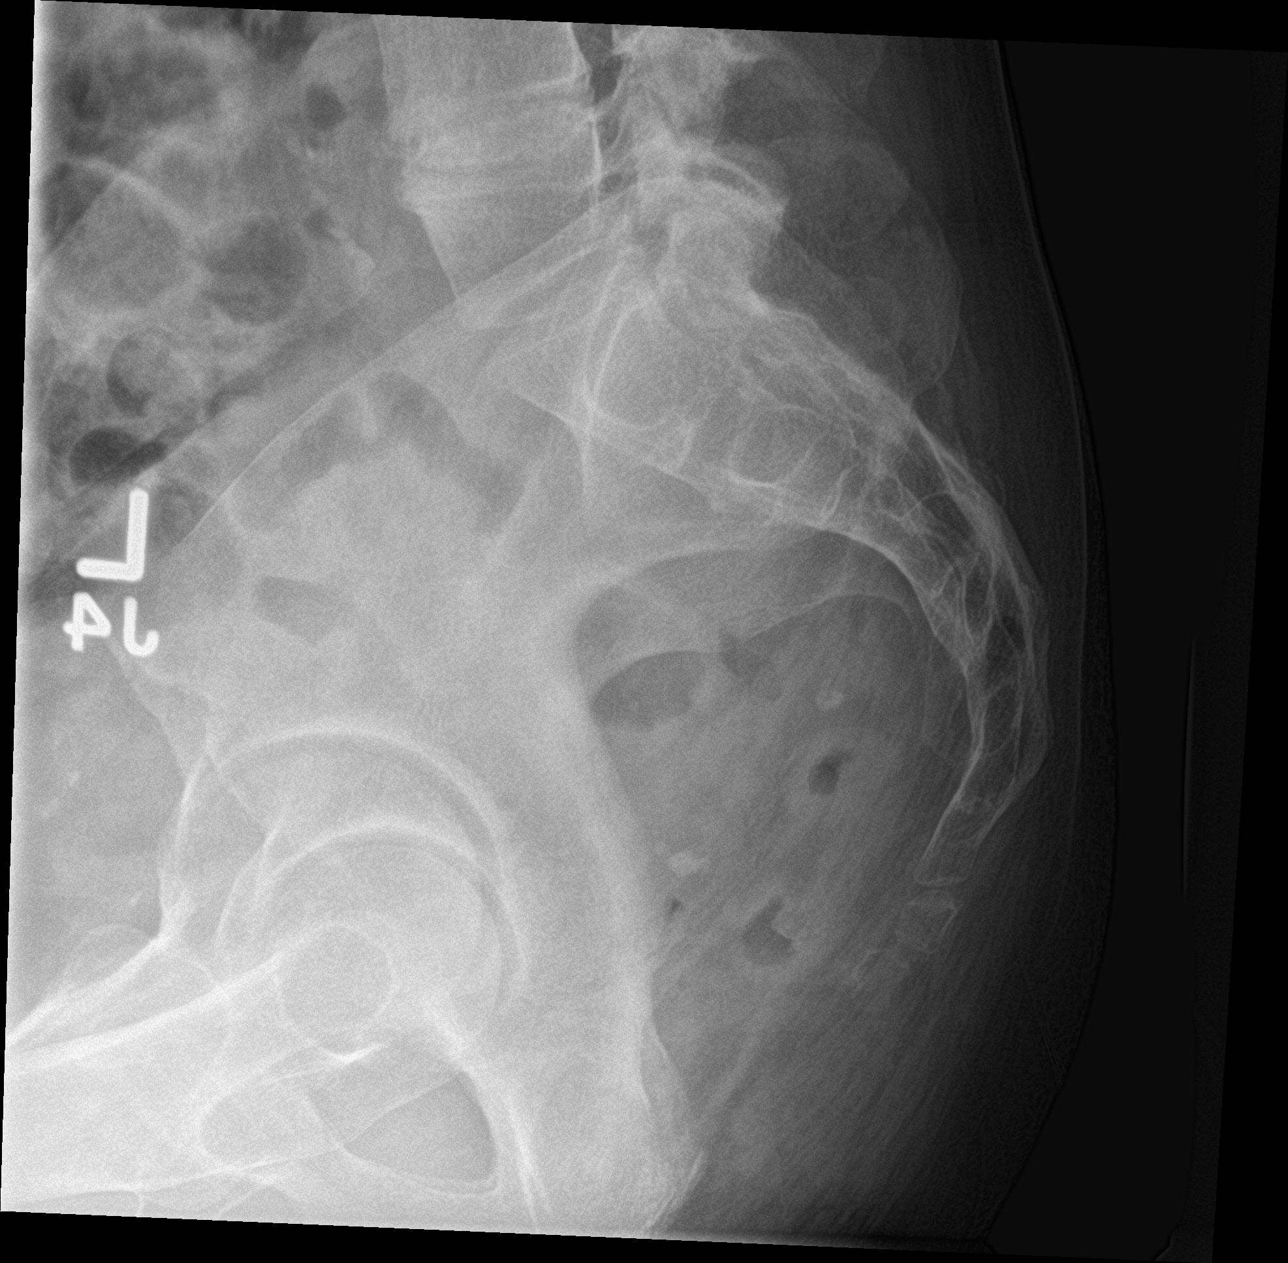

[3 of 3 positions shown; findings below may reference images not displayed]

FINDINGS: The sacrum and coccyx appear normal and show no evidence of fracture
or dislocation. Both sacroiliac joints are symmetric and normal in
appearance bilaterally. The visualized bony pelvis and visualize
lower lumbar vertebral bodies appear normal. No bony lesions or
significant arthropathy identified.
IMPRESSION: Normal sacrum and coccyx.

## 2016-08-05 IMAGING — DX DG ELBOW COMPLETE 3+V*L*
4 series · 4 of 4 positions shown · non-contrast
Comparison: October 20, 2010.

CLINICAL DATA: Acute left elbow pain after fall down steps today.
Initial encounter.

EXAM:
LEFT ELBOW - COMPLETE 3+ VIEW

[elbow ap]
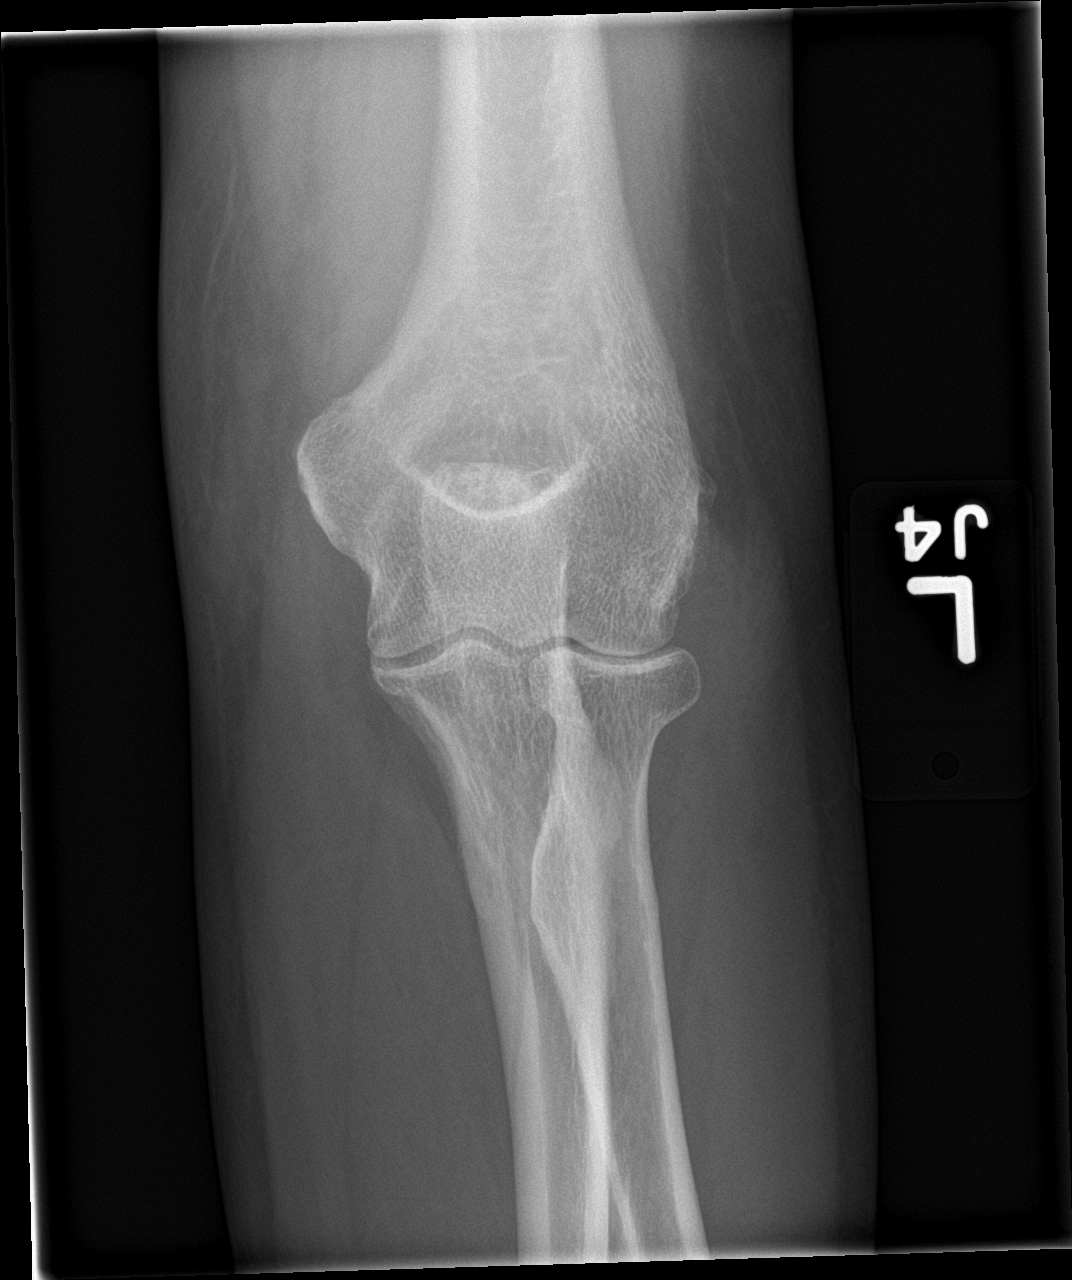

[elbow obl (1 of 2)]
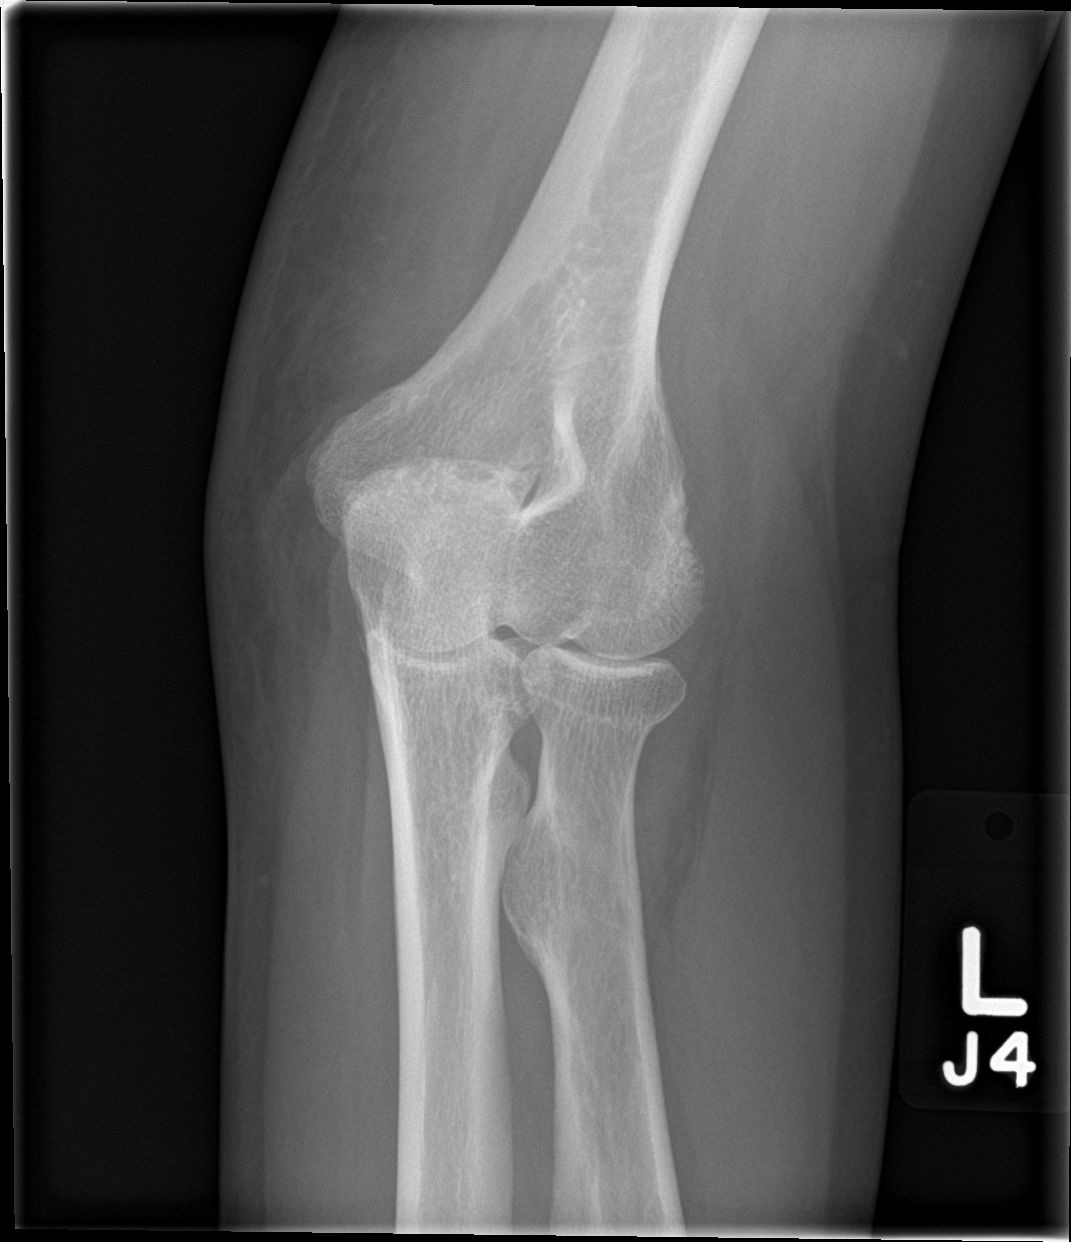

[elbow obl (2 of 2)]
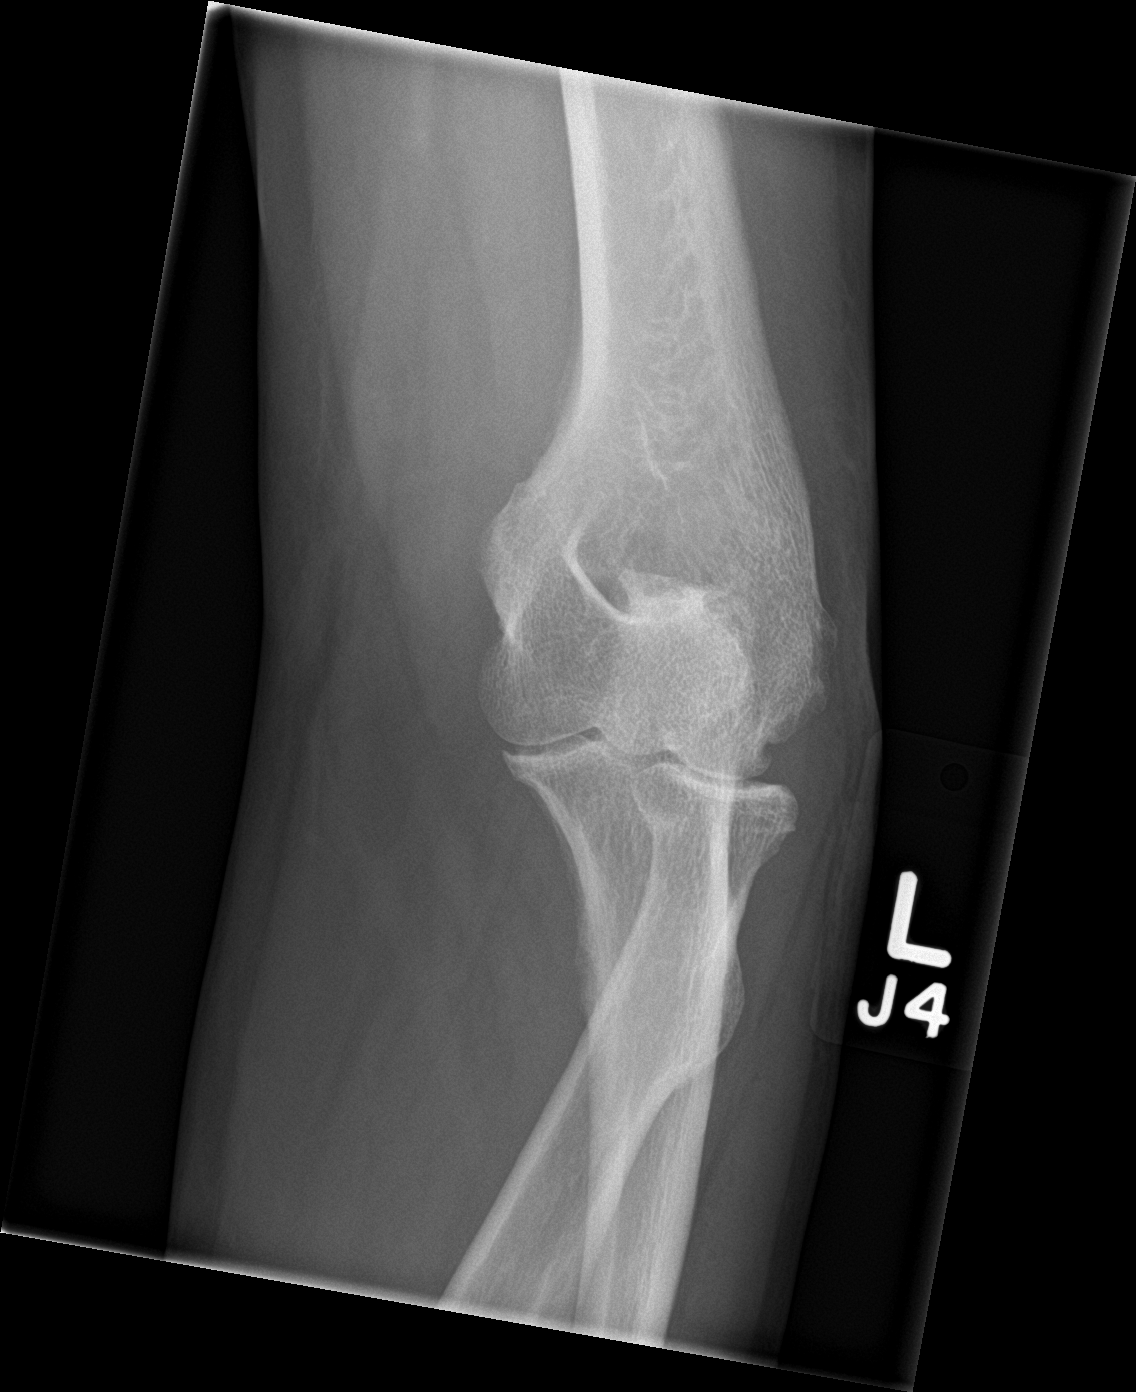

[elbow lat]
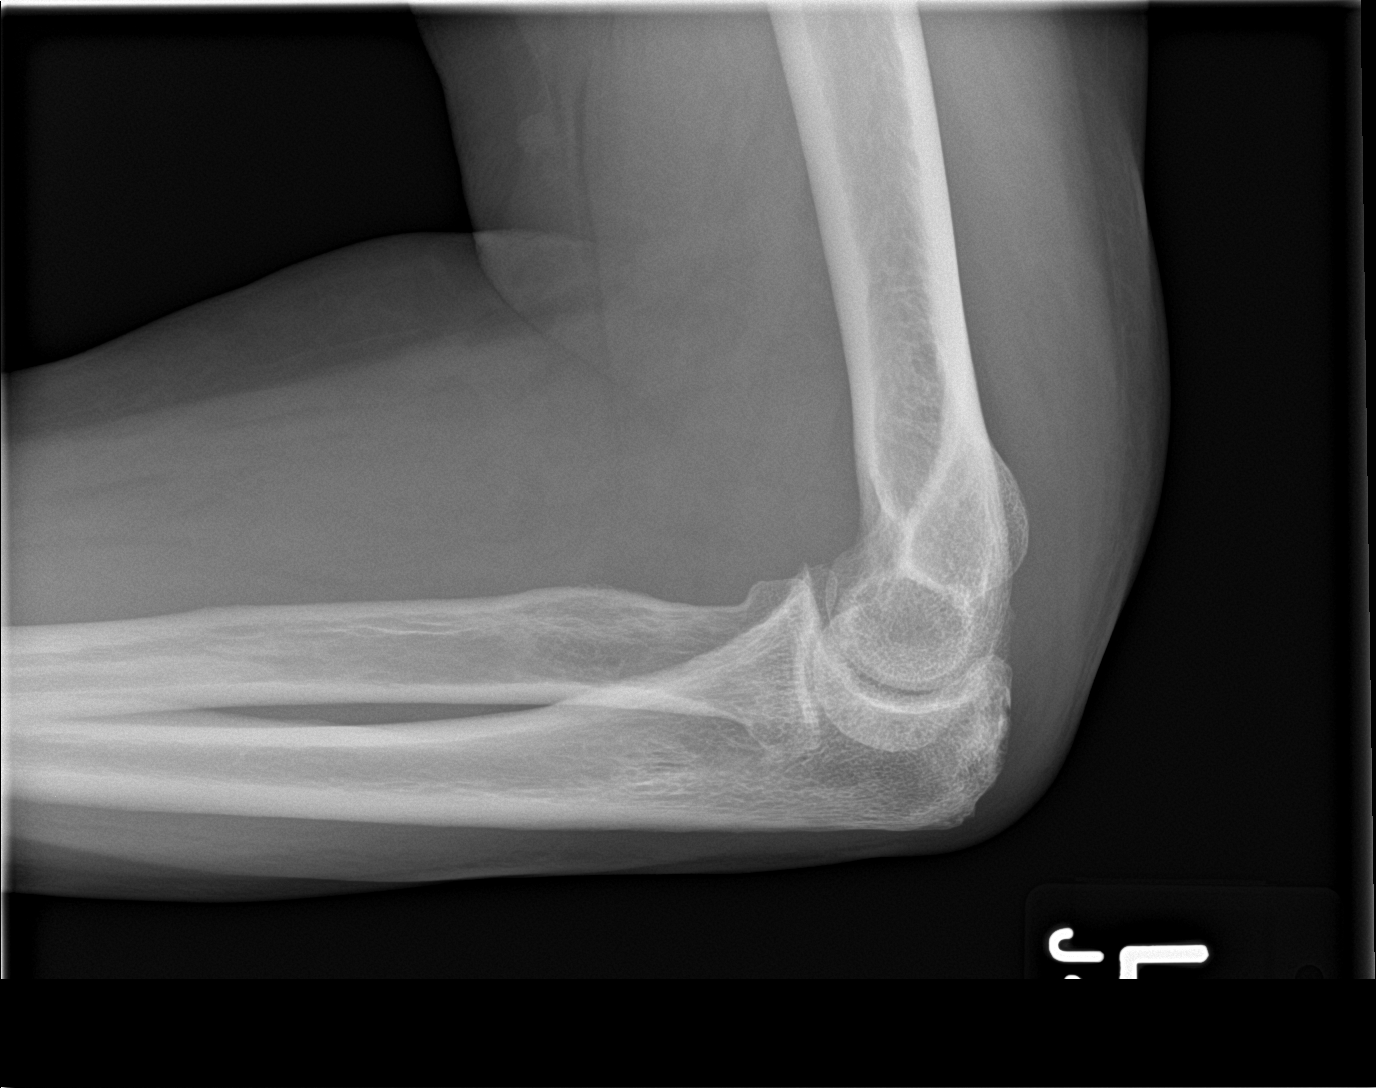

[4 of 4 positions shown; findings below may reference images not displayed]

FINDINGS: There is no evidence of fracture, dislocation, or joint effusion.
There is no evidence of arthropathy or other focal bone abnormality.
Soft tissues are unremarkable.
IMPRESSION: Normal left elbow.

## 2016-08-22 IMAGING — DX DG CHEST 2V
2 series · 2 of 2 positions shown · non-contrast
Comparison: 05/23/2014

CLINICAL DATA: Generalized abdominal pain, bloating. Bright red
blood in bowel movements since this morning. Fever.

EXAM:
CHEST  2 VIEW

[chest pa]
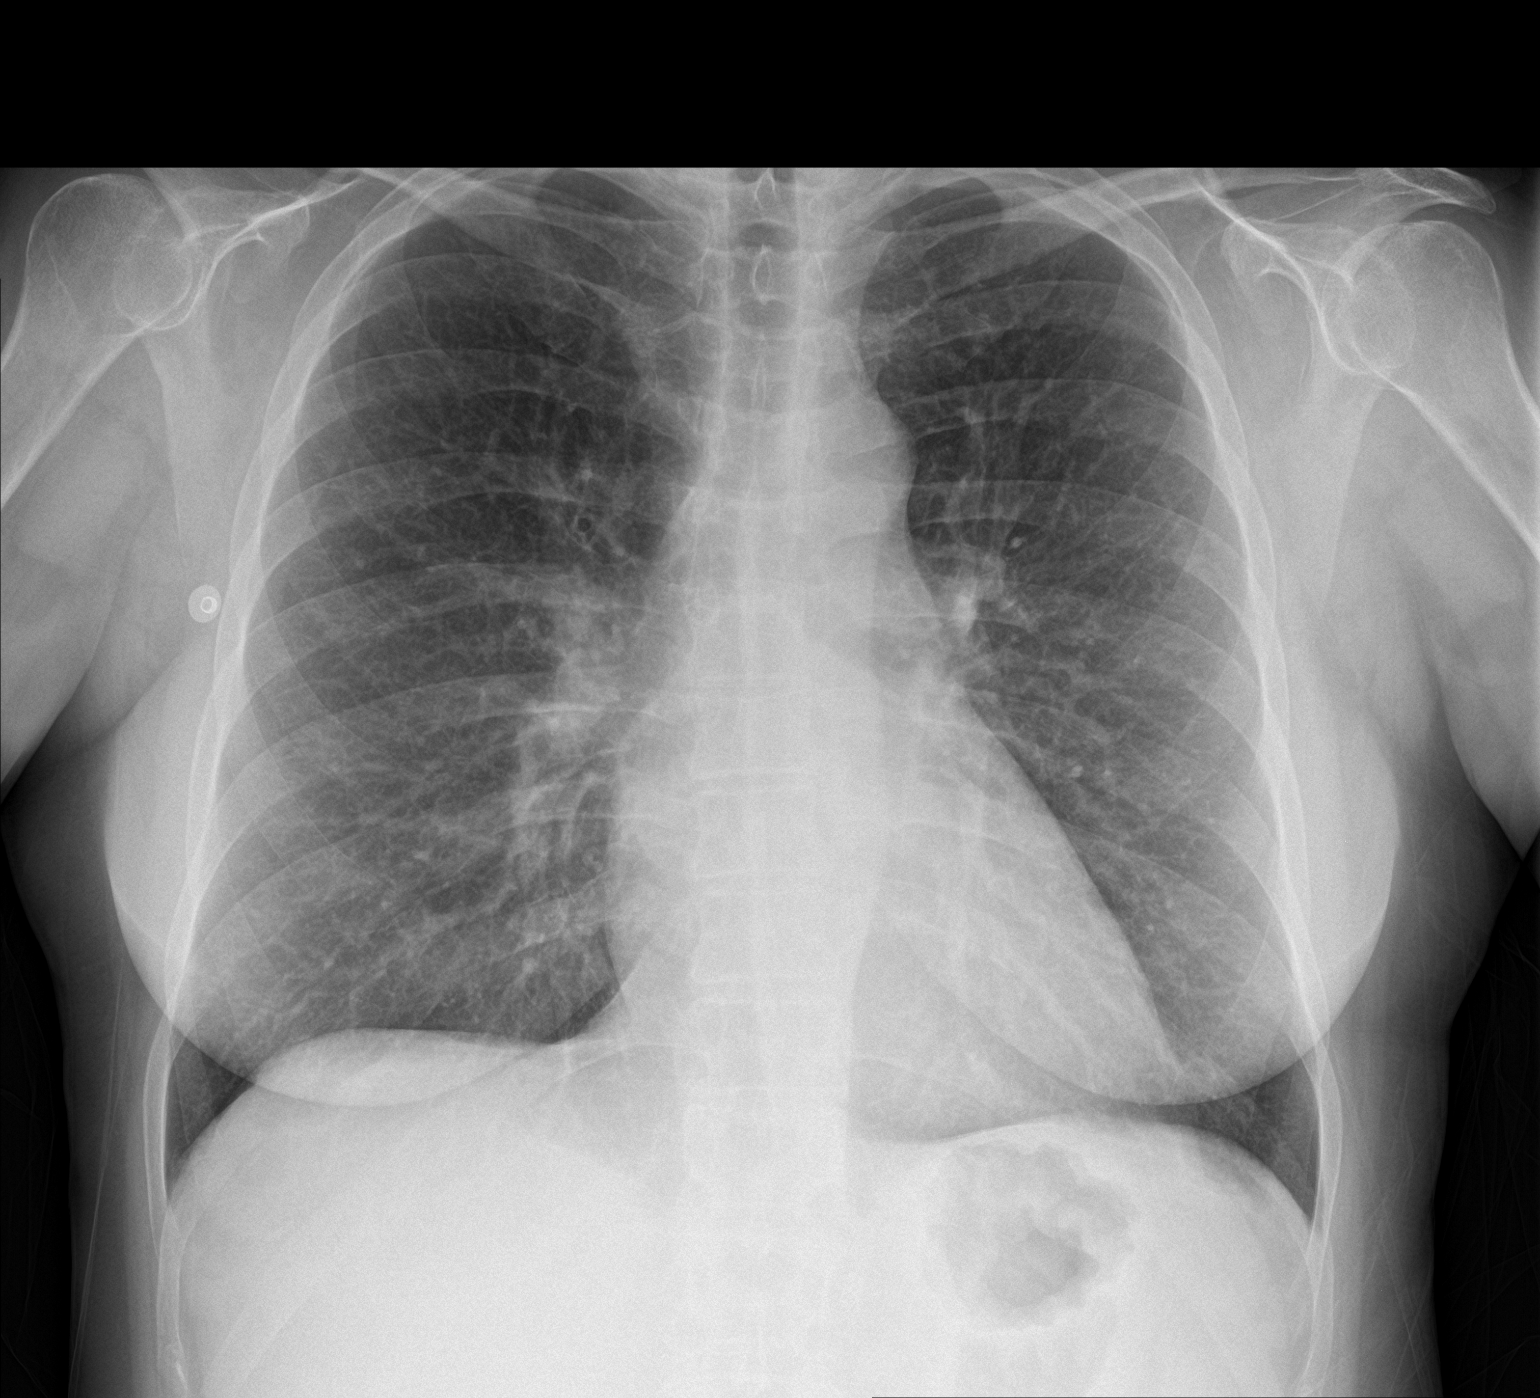

[chest lat]
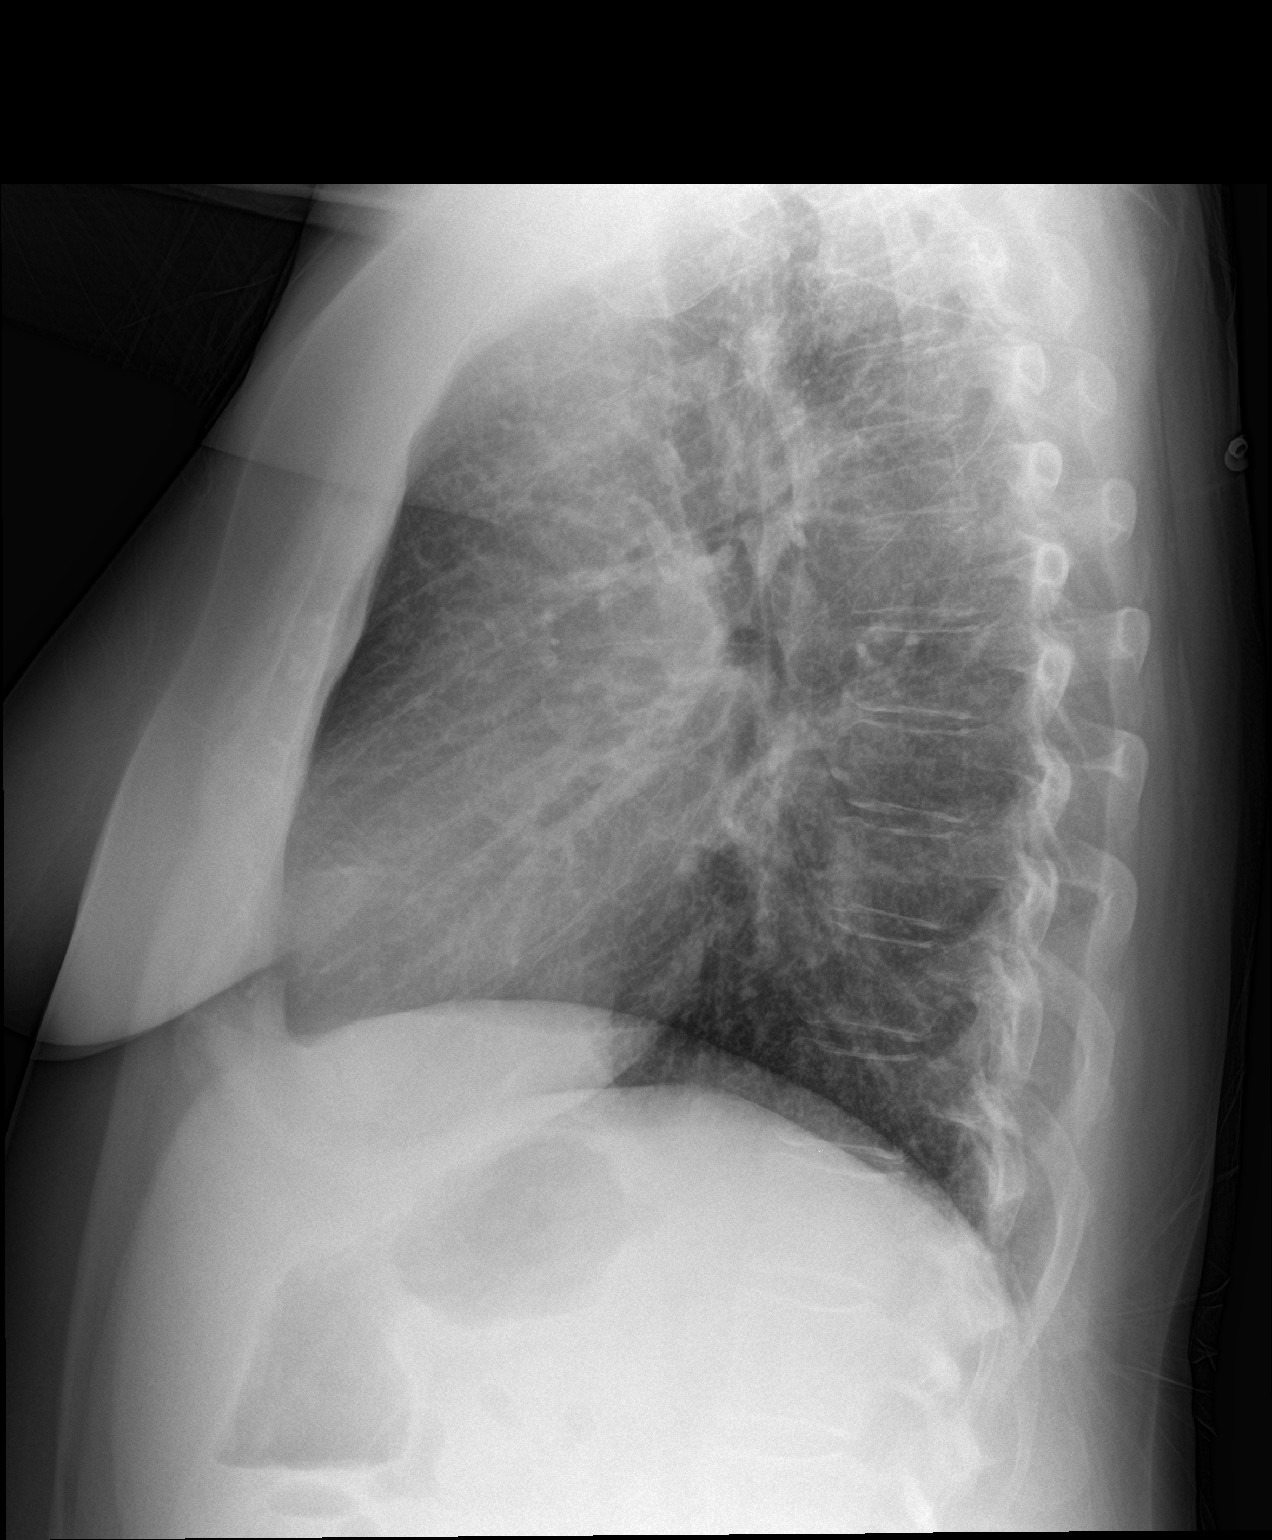

[2 of 2 positions shown; findings below may reference images not displayed]

FINDINGS: The heart size and mediastinal contours are within normal limits.
Both lungs are clear. The visualized skeletal structures are
unremarkable.
IMPRESSION: No active cardiopulmonary disease.

## 2016-08-22 IMAGING — CT CT ABD-PELV W/ CM
2 of 5 series · 16 of 46 positions shown, 18 images · IV contrast (omnipaque)
Comparison: CT 07/04/2014

CLINICAL DATA: Abdominal pain and bloating. Bright red blood per
rectum. Nausea. Emesis. Blood in bowel movement for 2 days.

EXAM:
CT ABDOMEN AND PELVIS WITH CONTRAST
TECHNIQUE: Multidetector CT imaging of the abdomen and pelvis was performed
using the standard protocol following bolus administration of
intravenous contrast.
CONTRAST:  25mL OMNIPAQUE IOHEXOL 300 MG/ML SOLN, 100mL OMNIPAQUE
IOHEXOL 300 MG/ML SOLN

[Series 2: abd_pel_with 5.0 b40f · axial · 0.79mm/px · z∈[-351,+54]mm · 13 of 93 slices shown, 15 images]
[im 6/93  soft-tissue]
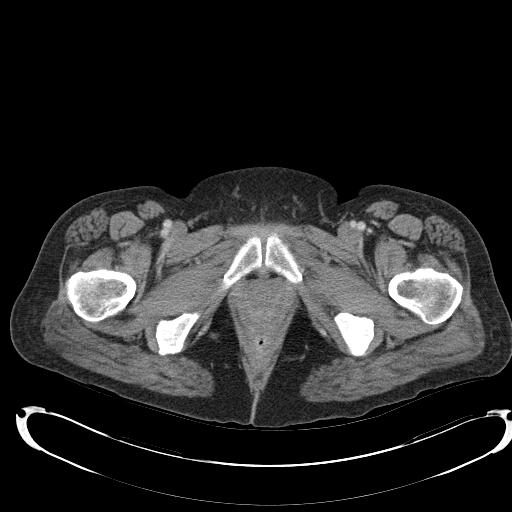
[im 6/93  bone]
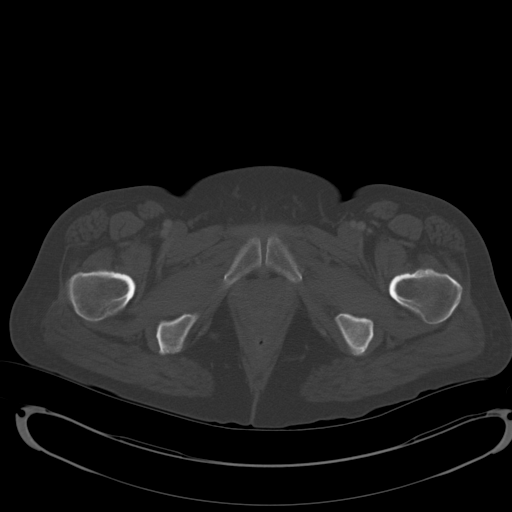
[im 12/93  soft-tissue]
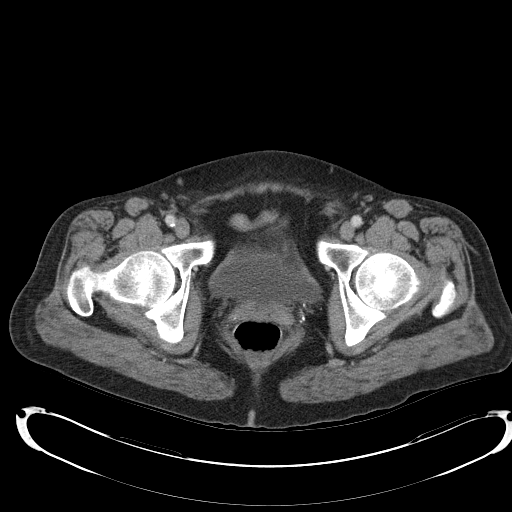
[im 18/93  soft-tissue]
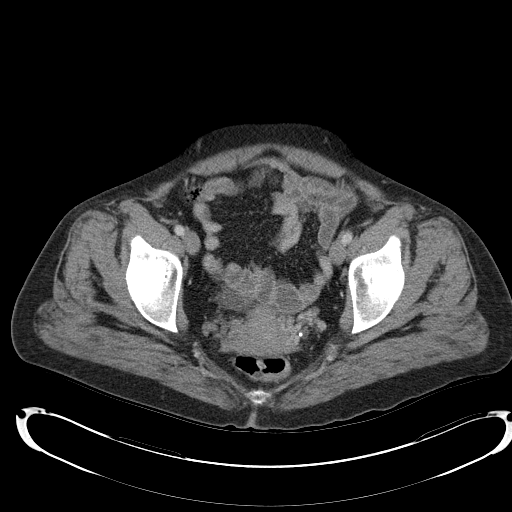
[im 29/93  soft-tissue]
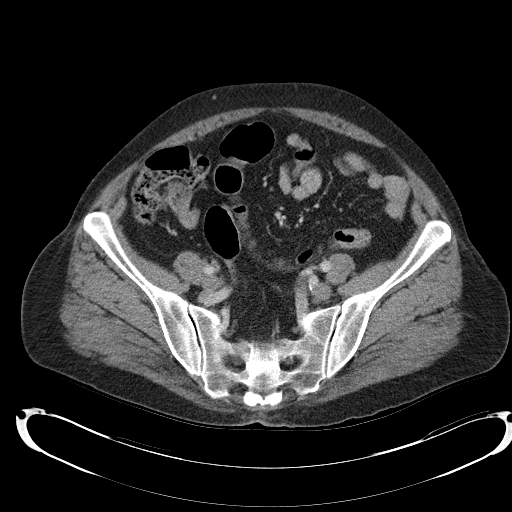
[im 35/93  soft-tissue]
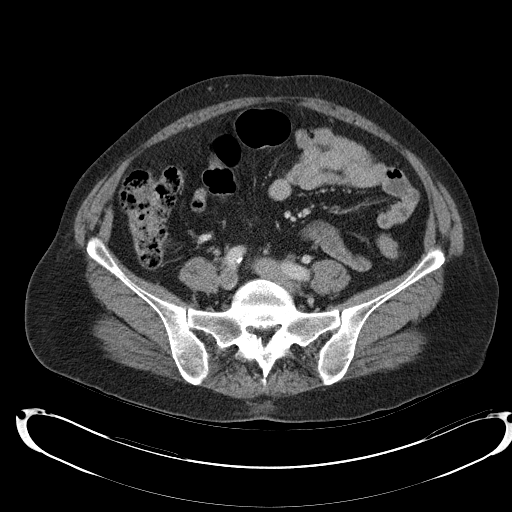
[im 41/93  soft-tissue]
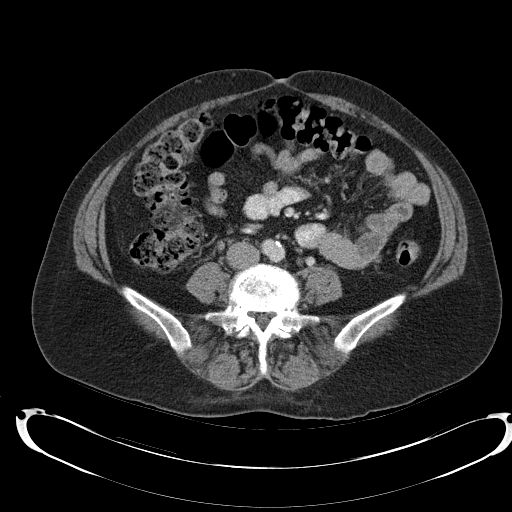
[im 47/93  soft-tissue]
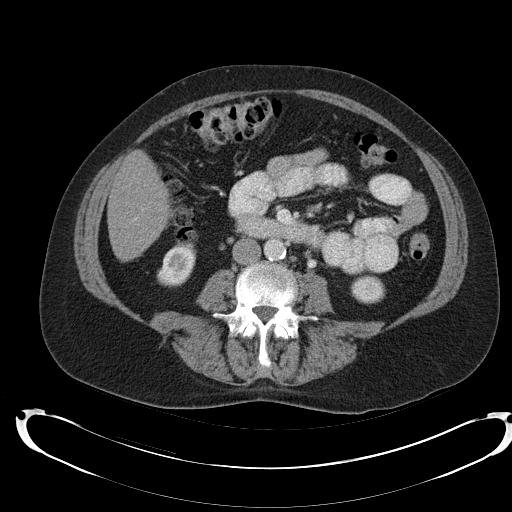
[im 52/93  soft-tissue]
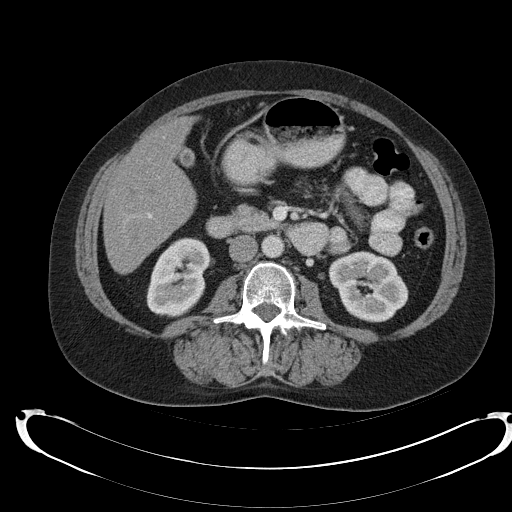
[im 58/93  soft-tissue]
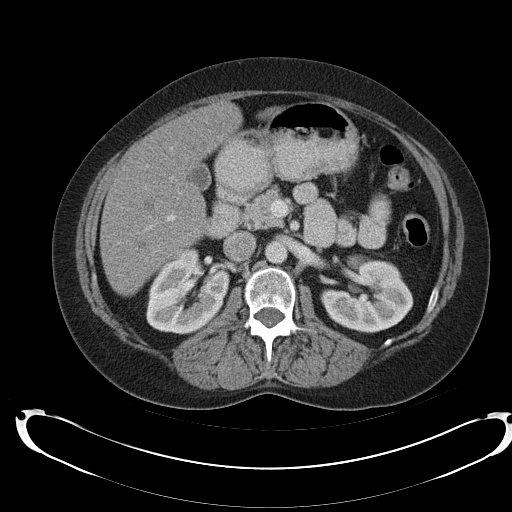
[im 58/93  bone]
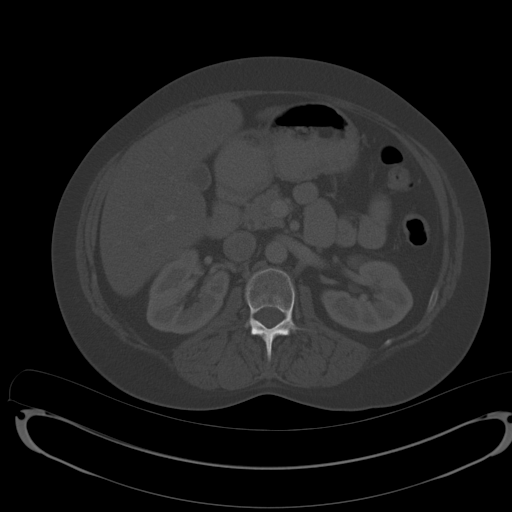
[im 64/93  soft-tissue]
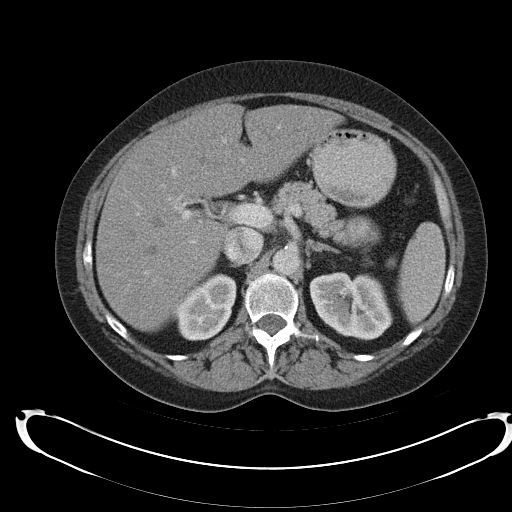
[im 75/93  soft-tissue]
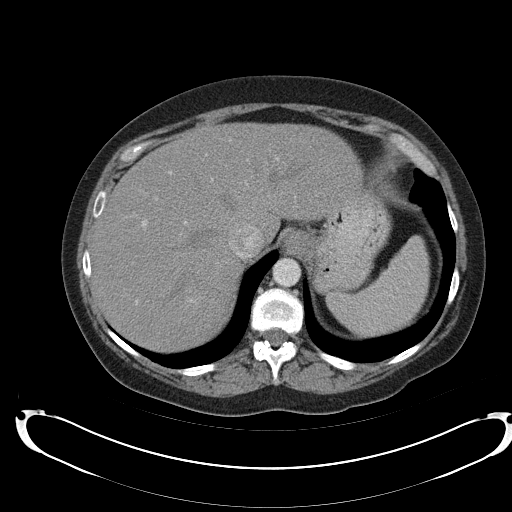
[im 81/93  soft-tissue]
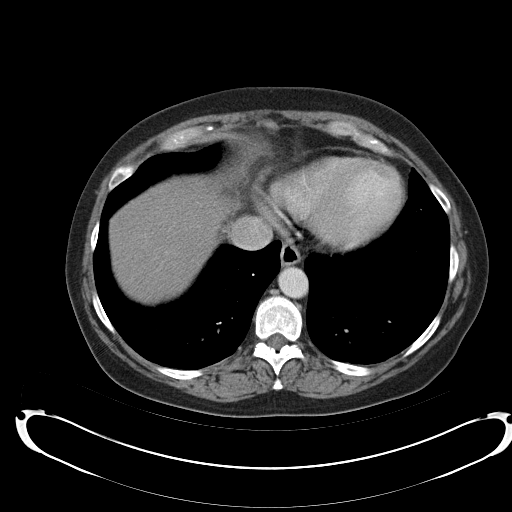
[im 87/93  soft-tissue]
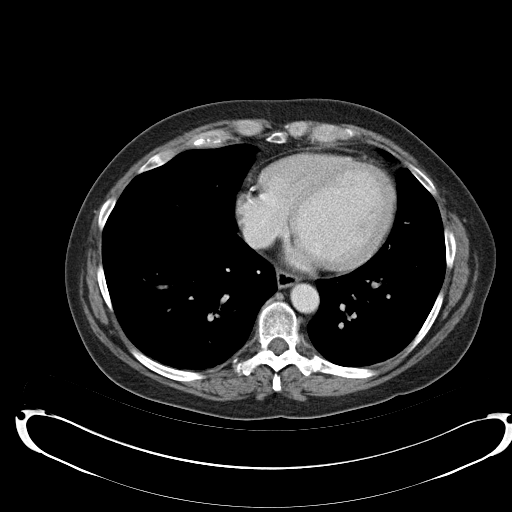

[Series 3: abd_pel_with 3.0 spo · coronal · 0.75mm/px · 3 of 111 slices shown]
[im 37/111  soft-tissue]
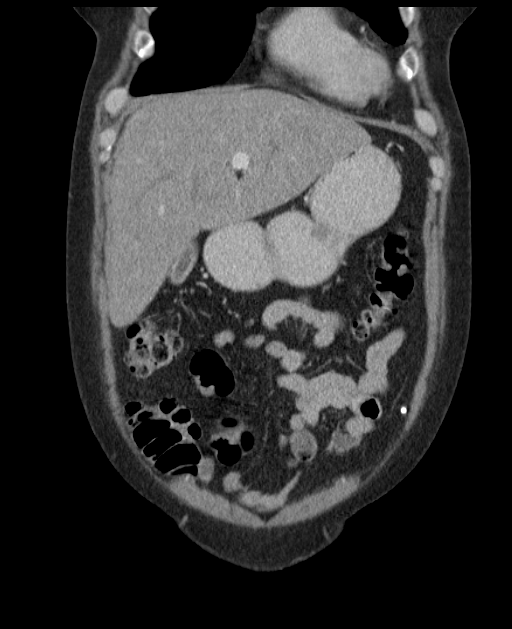
[im 49/111  soft-tissue]
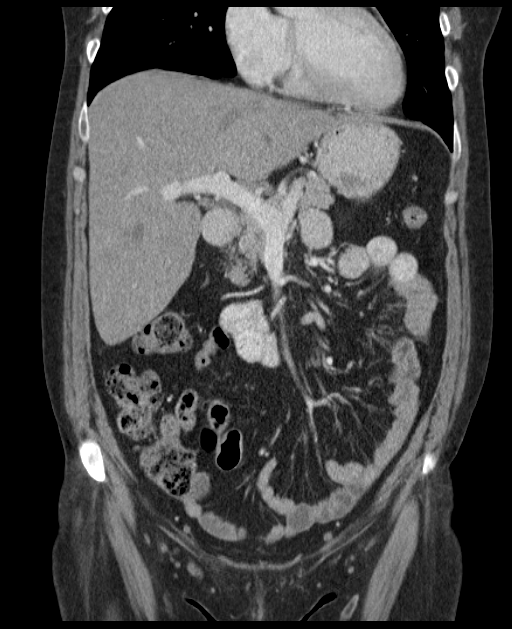
[im 62/111  soft-tissue]
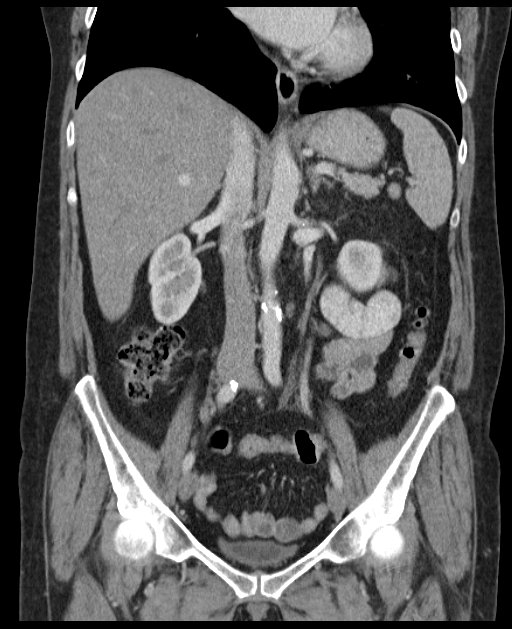

[16 of 46 positions shown; findings below may reference images not displayed]

FINDINGS: Lower chest: Lung bases are clear.

Hepatobiliary: No focal hepatic lesion. No biliary duct dilatation.
Gallbladder is normal. Common bile duct is normal.

Pancreas: Pancreas is normal. No ductal dilatation. No pancreatic
inflammation.

Spleen: Normal spleen

Adrenals/urinary tract: Adrenal glands and kidneys are normal. The
ureters and bladder normal.

Stomach/Bowel: Stomach, small bowel, appendix, and cecum are normal.
The colon and rectosigmoid colon are normal.

Vascular/Lymphatic: Abdominal aorta is normal caliber with
atherosclerotic calcification. There is no retroperitoneal or
periportal lymphadenopathy. No pelvic lymphadenopathy.

Reproductive: Uterus and ovaries are normal.

Musculoskeletal: No aggressive osseous lesion.

Other: No free fluid.
IMPRESSION: 1. No explanation for gastrointestinal bleeding. Consider
colonoscopy.
2.  Atherosclerotic calcification of the abdominal aorta.

## 2016-09-30 IMAGING — DX DG ANKLE COMPLETE 3+V*R*
3 series · 3 of 3 positions shown · non-contrast
Comparison: 05/19/2014

CLINICAL DATA: Bilateral ankle pain and swelling, rheumatoid
arthritis

EXAM:
RIGHT ANKLE - COMPLETE 3+ VIEW

[ankle ap]
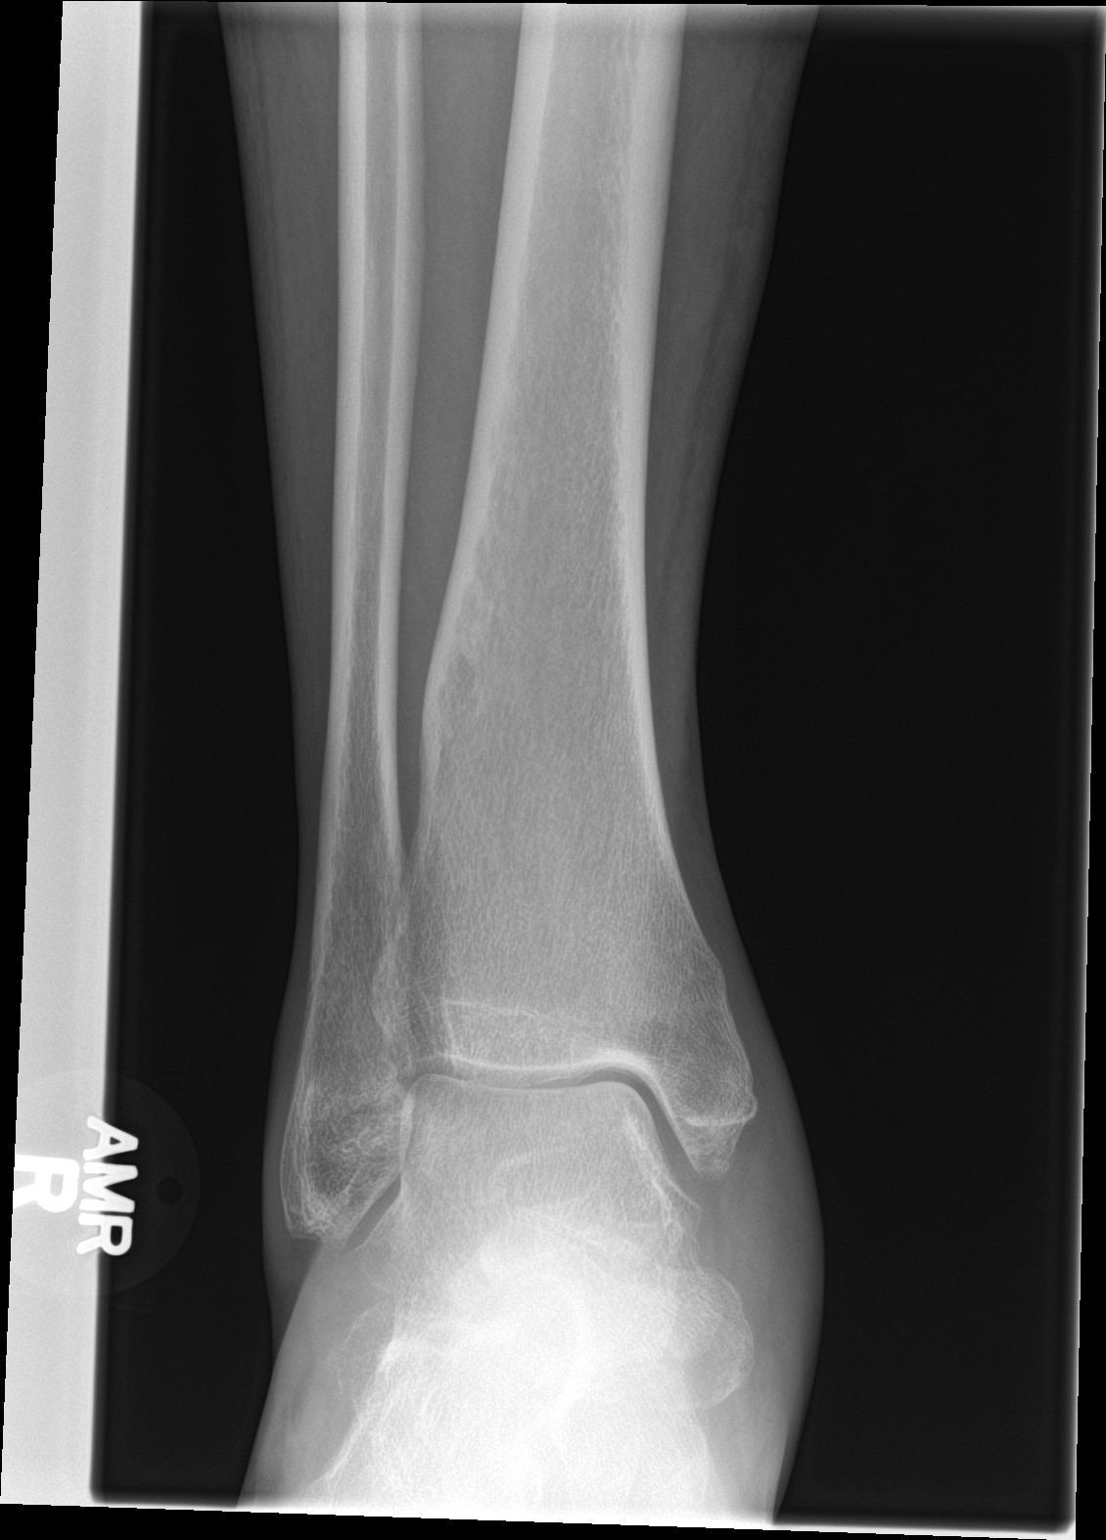

[ankle obl]
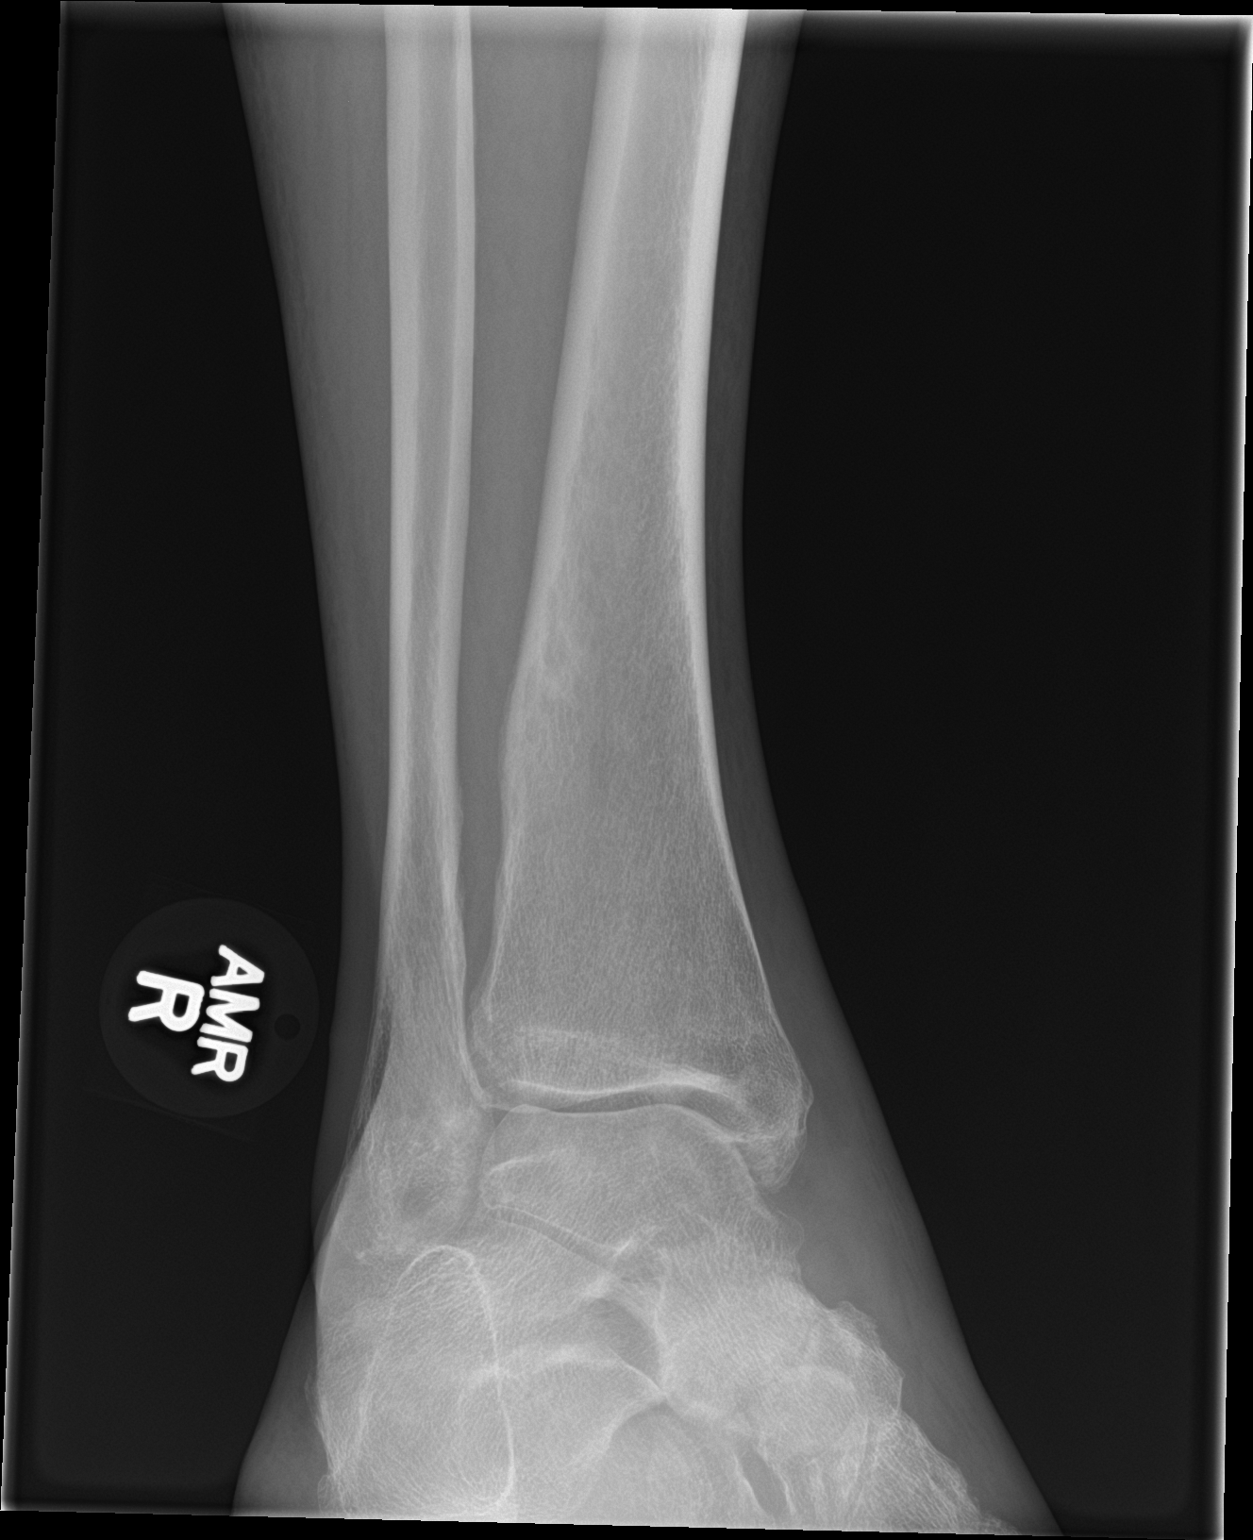

[ankle lat]
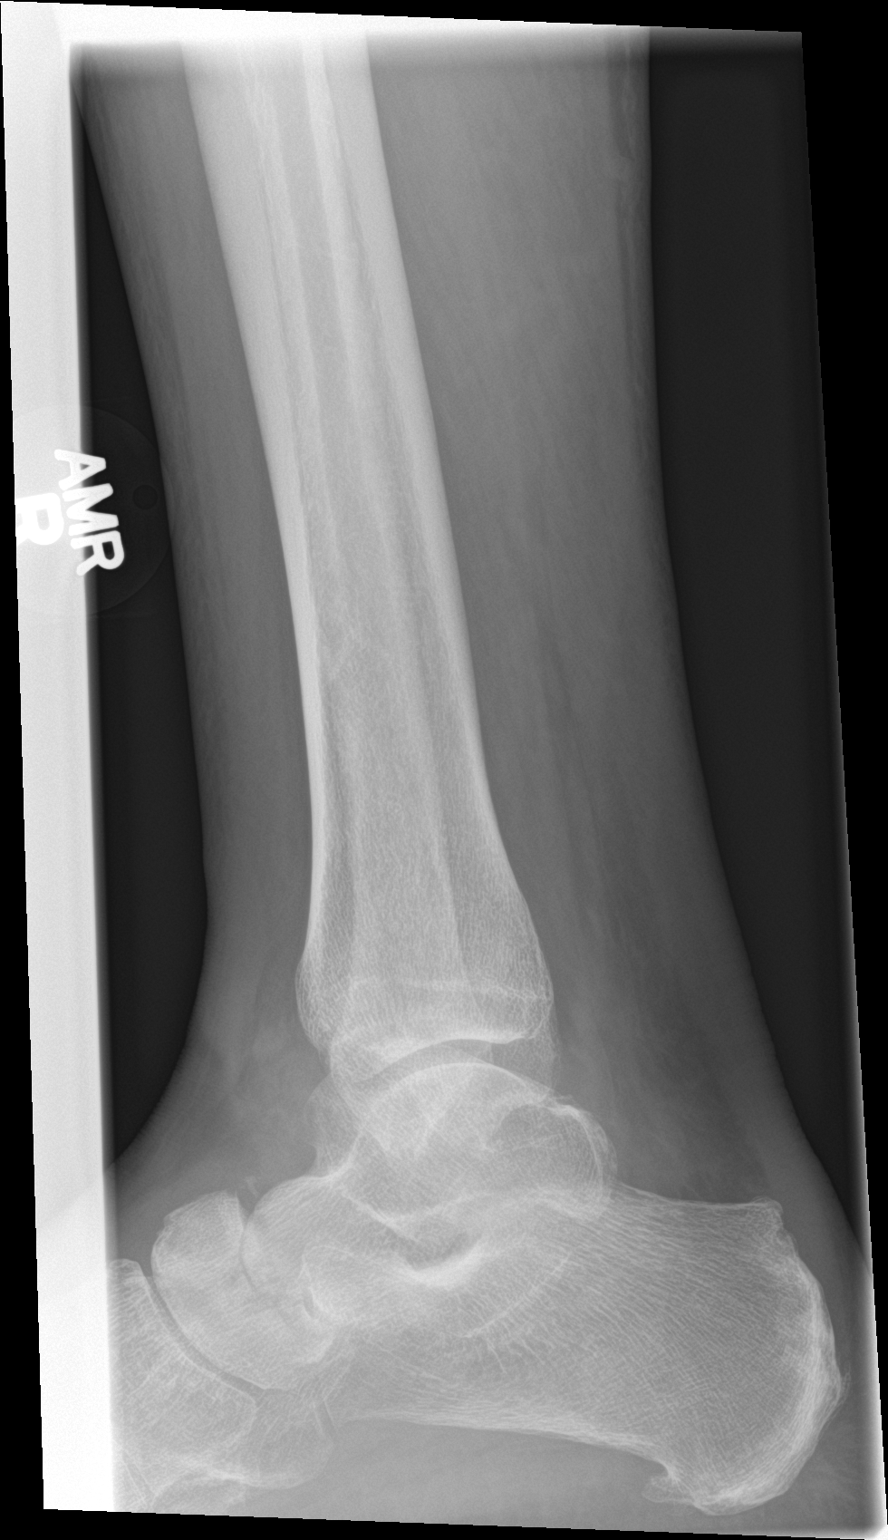

[3 of 3 positions shown; findings below may reference images not displayed]

FINDINGS: Three views of the right ankle submitted. No acute fracture or
subluxation. Ankle mortise is preserved. No radiopaque foreign body.
There is plantar spurring of calcaneus. Degenerative changes
talonavicular joint.
IMPRESSION: No acute fracture or subluxation. Plantar spurring of calcaneus.
Degenerative changes talonavicular joint.

## 2016-11-09 IMAGING — CT CT ANGIO CHEST
2 of 6 series · 6 of 36 positions shown · IV contrast (Omnipaque 300)
Comparison: 05/23/2014

CLINICAL DATA: Chest pain, shortness of breath, and elevated
D-dimer.

EXAM:
CT ANGIOGRAPHY CHEST WITH CONTRAST
TECHNIQUE: Multidetector CT imaging of the chest was performed using the
standard protocol during bolus administration of intravenous
contrast. Multiplanar CT image reconstructions and MIPs were
obtained to evaluate the vascular anatomy.
CONTRAST:  100mL OMNIPAQUE IOHEXOL 350 MG/ML SOLN

[Series 4: pe 3.0 b40f · axial · 0.65mm/px · z∈[-230,-29]mm · 5 of 101 slices shown]
[im 17/101  lung]
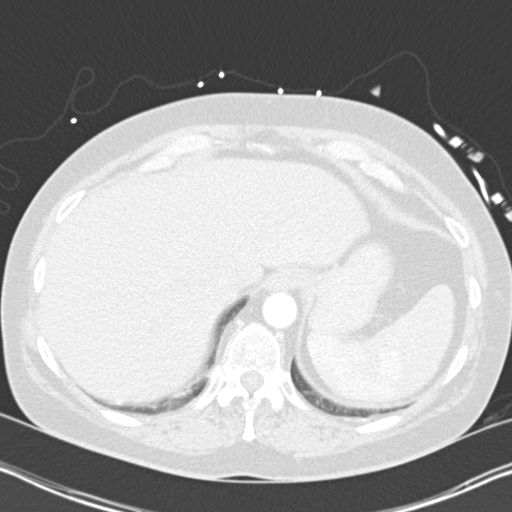
[im 34/101  mediastinal]
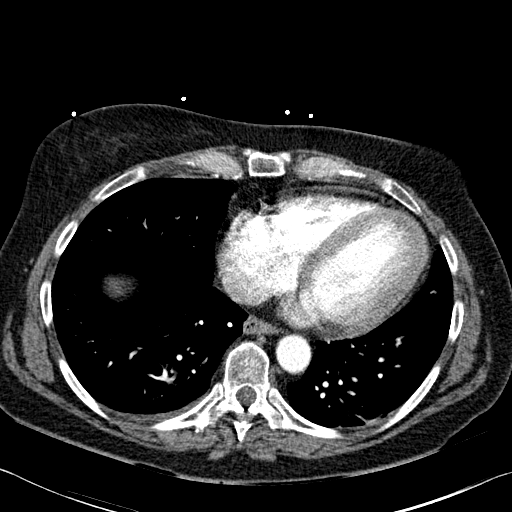
[im 51/101  lung]
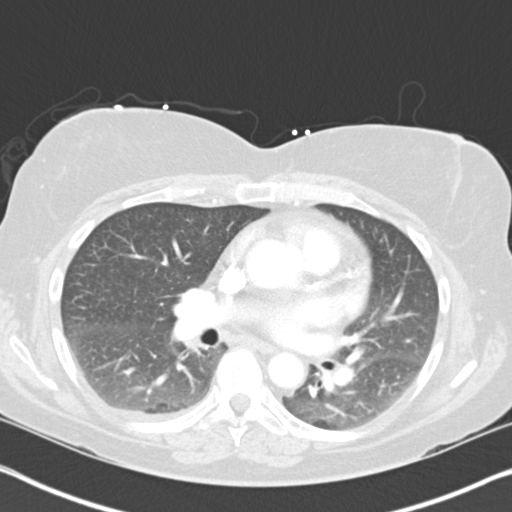
[im 67/101  mediastinal]
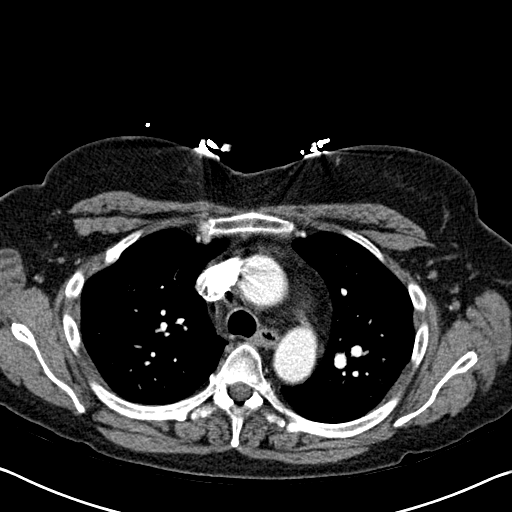
[im 84/101  lung]
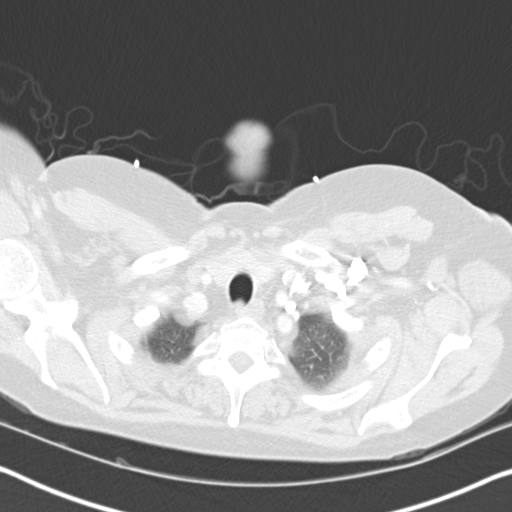

[Series 6: mpr coronal pe 3mm · coronal · 0.61mm/px · 1 of 89 slices shown]
[im 45/89  mediastinal]
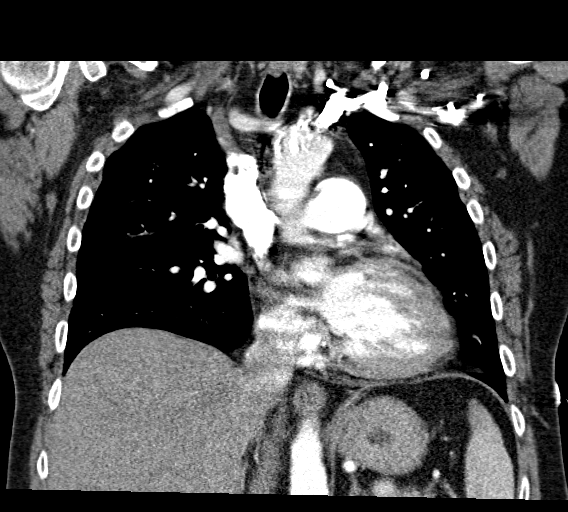

[6 of 36 positions shown; findings below may reference images not displayed]

FINDINGS: THORACIC INLET/BODY WALL:

No acute abnormality.

MEDIASTINUM:

Normal heart size. No pericardial effusion. Age advanced coronary
atherosclerosis with calcifications noted along the LAD and right
coronary. No evidence of pulmonary embolism. Negative for aortic
dissection. No adenopathy.

LUNG WINDOWS:

Subsegmental atelectasis at the bases. Mild bronchial wall
thickening, chronic. There is no edema, consolidation, effusion, or
pneumothorax.

UPPER ABDOMEN:

No acute findings.

OSSEOUS:

No acute fracture.  No suspicious lytic or blastic lesions.

Review of the MIP images confirms the above findings.
IMPRESSION: 1. No evidence of pulmonary embolism.
2. Bronchitis and subsegmental atelectasis.
3. Coronary atherosclerosis.

## 2016-11-09 IMAGING — DX DG CHEST 2V
2 series · 2 of 2 positions shown · non-contrast
Comparison: PA and lateral chest 08/10/2014.  CT chest 05/23/2014.

CLINICAL DATA: Shortness of breath and fever for 2-3 days,
worsening. History of COPD.

EXAM:
CHEST  2 VIEW

[chest pa]
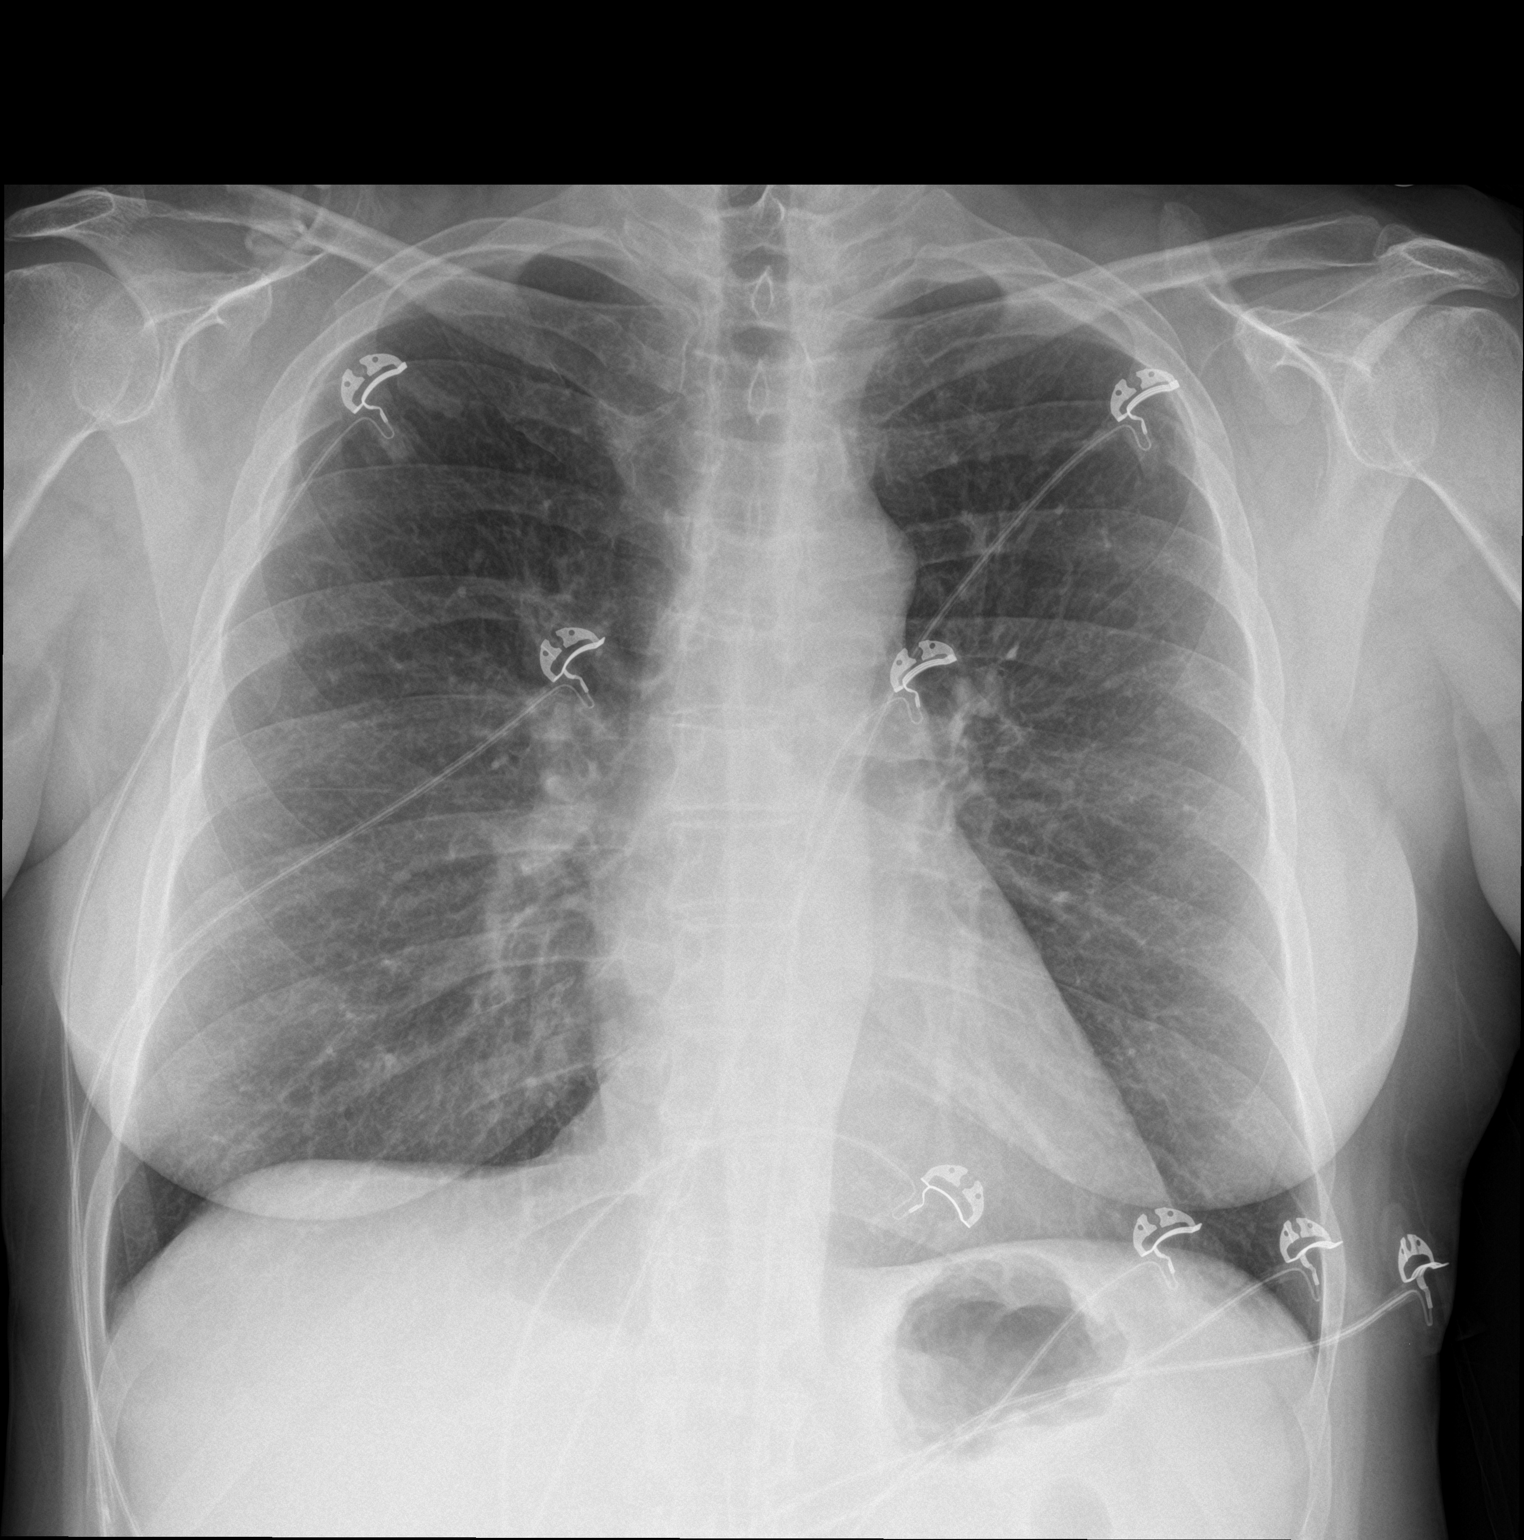

[chest lat]
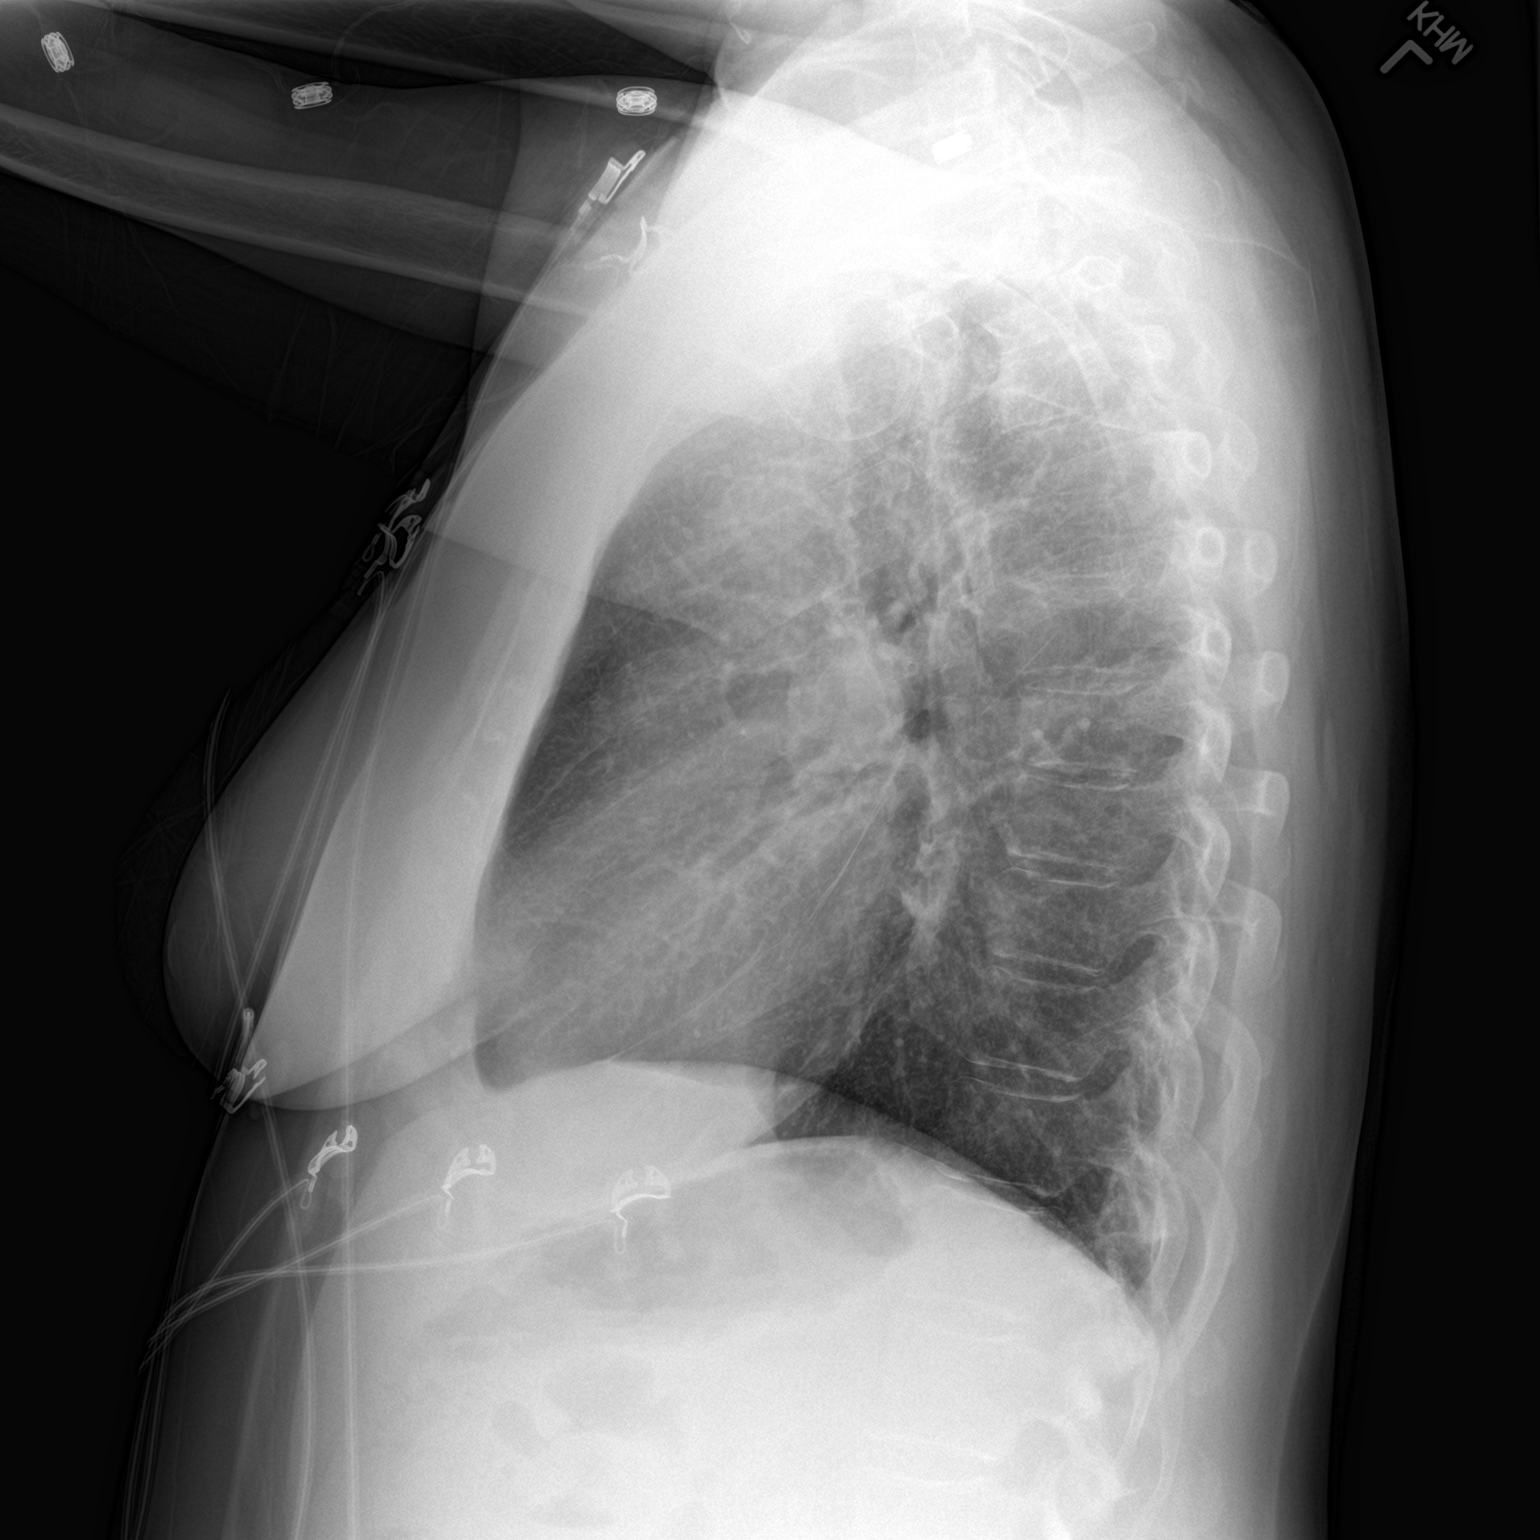

[2 of 2 positions shown; findings below may reference images not displayed]

FINDINGS: The lungs are clear. Heart size is normal. No pneumothorax or
pleural effusion. No focal bony abnormality.
IMPRESSION: No acute disease.

## 2016-12-16 ENCOUNTER — Other Ambulatory Visit: Payer: Self-pay

## 2016-12-16 ENCOUNTER — Emergency Department (HOSPITAL_COMMUNITY)
Admission: EM | Admit: 2016-12-16 | Discharge: 2016-12-16 | Disposition: A | Discharge status: Home / Self Care | Payer: No Typology Code available for payment source | Attending: Emergency Medicine | Admitting: Emergency Medicine

## 2016-12-16 ENCOUNTER — Emergency Department (HOSPITAL_COMMUNITY): Payer: No Typology Code available for payment source

## 2016-12-16 ENCOUNTER — Encounter (HOSPITAL_COMMUNITY): Payer: Self-pay | Admitting: Emergency Medicine

## 2016-12-16 DIAGNOSIS — J449 Chronic obstructive pulmonary disease, unspecified: Secondary | ICD-10-CM | POA: Diagnosis not present

## 2016-12-16 DIAGNOSIS — I10 Essential (primary) hypertension: Secondary | ICD-10-CM | POA: Diagnosis not present

## 2016-12-16 DIAGNOSIS — Z79899 Other long term (current) drug therapy: Secondary | ICD-10-CM | POA: Insufficient documentation

## 2016-12-16 DIAGNOSIS — F17210 Nicotine dependence, cigarettes, uncomplicated: Secondary | ICD-10-CM | POA: Insufficient documentation

## 2016-12-16 DIAGNOSIS — S161XXA Strain of muscle, fascia and tendon at neck level, initial encounter: Secondary | ICD-10-CM | POA: Insufficient documentation

## 2016-12-16 DIAGNOSIS — Y999 Unspecified external cause status: Secondary | ICD-10-CM | POA: Diagnosis not present

## 2016-12-16 DIAGNOSIS — Y939 Activity, unspecified: Secondary | ICD-10-CM | POA: Insufficient documentation

## 2016-12-16 DIAGNOSIS — S5001XA Contusion of right elbow, initial encounter: Secondary | ICD-10-CM | POA: Diagnosis not present

## 2016-12-16 DIAGNOSIS — X509XXA Other and unspecified overexertion or strenuous movements or postures, initial encounter: Secondary | ICD-10-CM | POA: Diagnosis not present

## 2016-12-16 DIAGNOSIS — Y929 Unspecified place or not applicable: Secondary | ICD-10-CM | POA: Diagnosis not present

## 2016-12-16 MED ORDER — CYCLOBENZAPRINE HCL 10 MG PO TABS: 10.0000 mg | ORAL_TABLET | Freq: Three times a day (TID) | ORAL | 0 refills | Status: DC | PRN

## 2016-12-16 MED ORDER — HYDROCODONE-ACETAMINOPHEN 5-325 MG PO TABS: ORAL_TABLET | ORAL | 0 refills | Status: DC
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
## 2016-12-16 NOTE — Discharge Instructions (Signed)
Follow-up with your primary doctor for recheck or call Dr. Mort Sawyers office to arrange a follow-up appt
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
## 2017-01-04 IMAGING — DX DG ABDOMEN ACUTE W/ 1V CHEST
3 series · 3 of 3 positions shown · non-contrast
Comparison: CT of the abdomen and pelvis from 08/10/2014, and CT of
the chest performed 10/28/2014

CLINICAL DATA: Acute onset of generalized abdominal distention and
vomiting. Initial encounter.

EXAM:
DG ABDOMEN ACUTE W/ 1V CHEST

[chest pa]
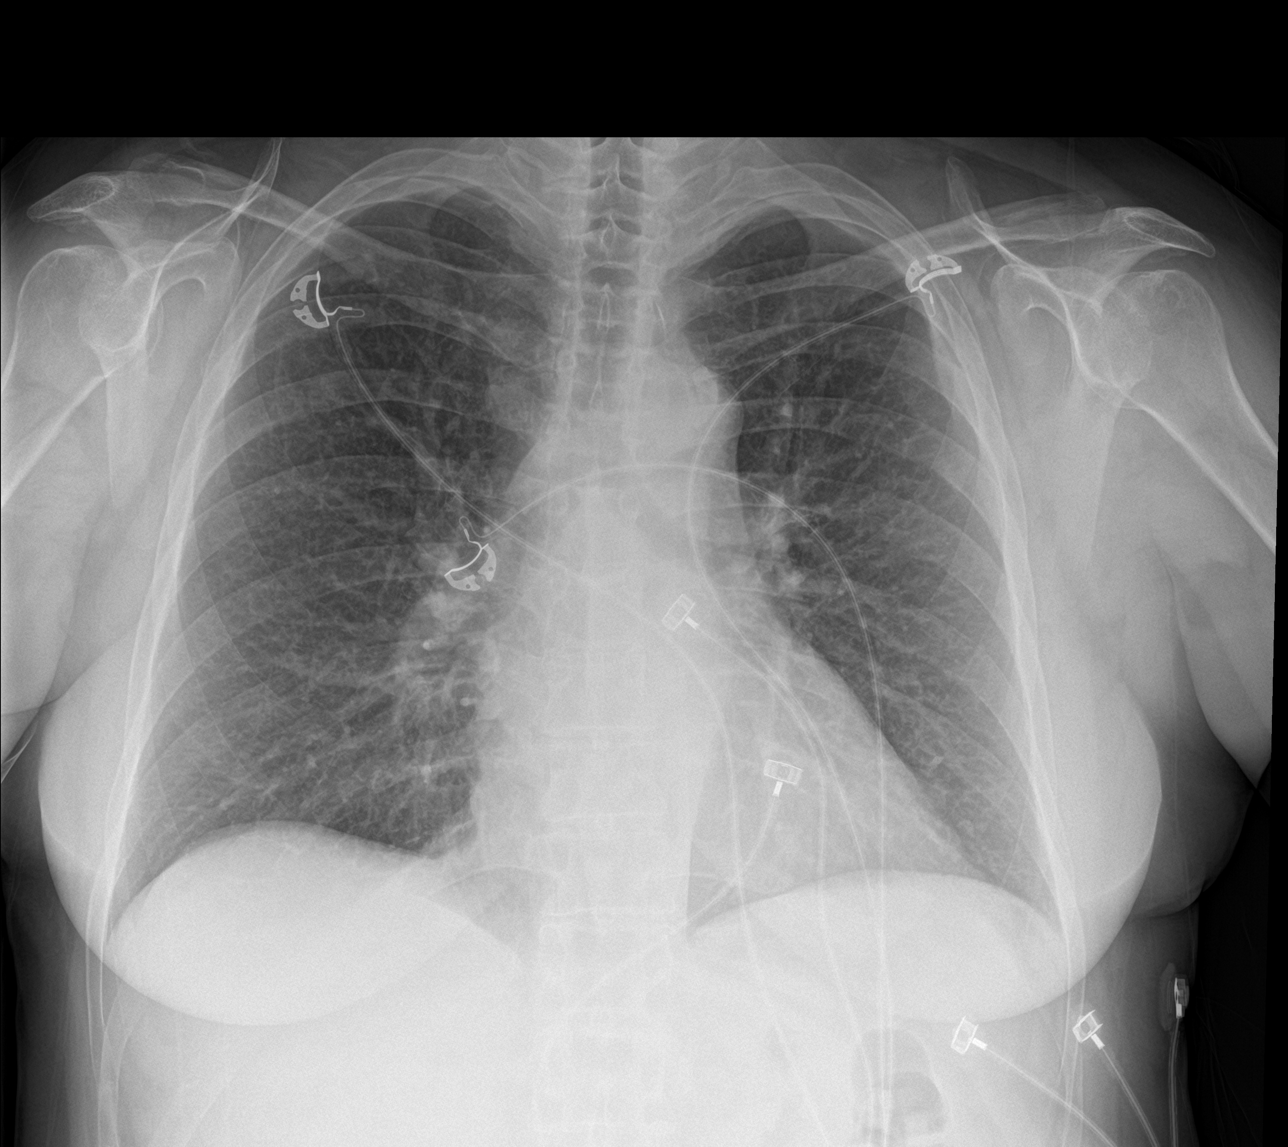

[abdomen erect]
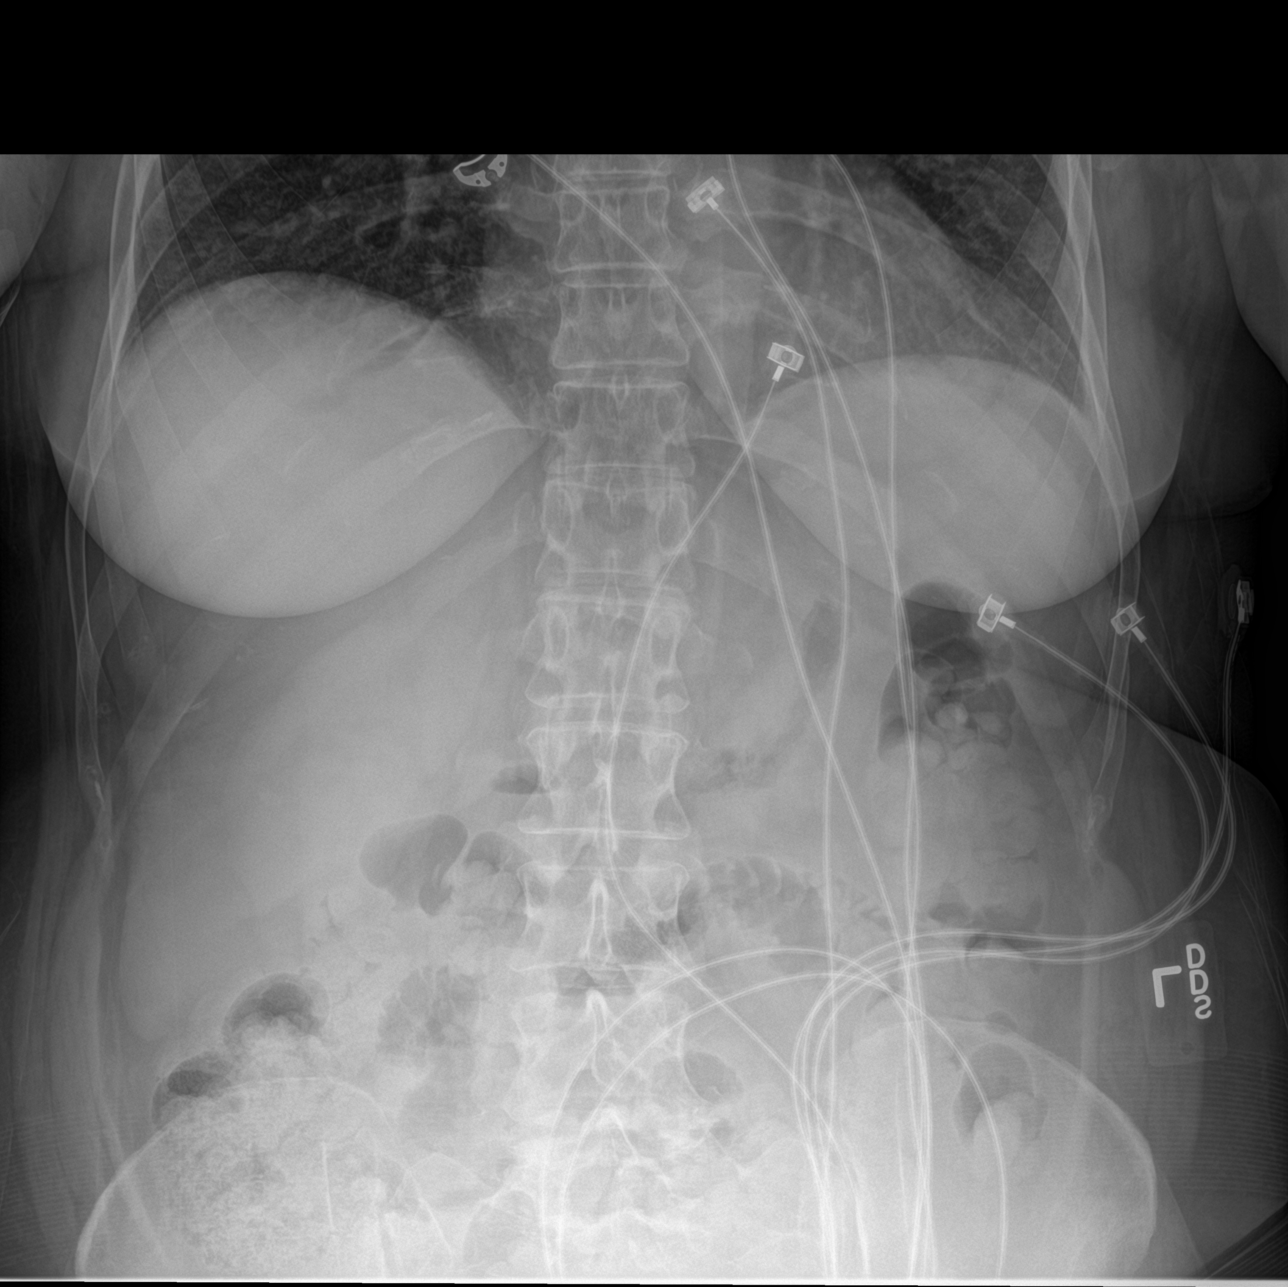

[abdomen supine]
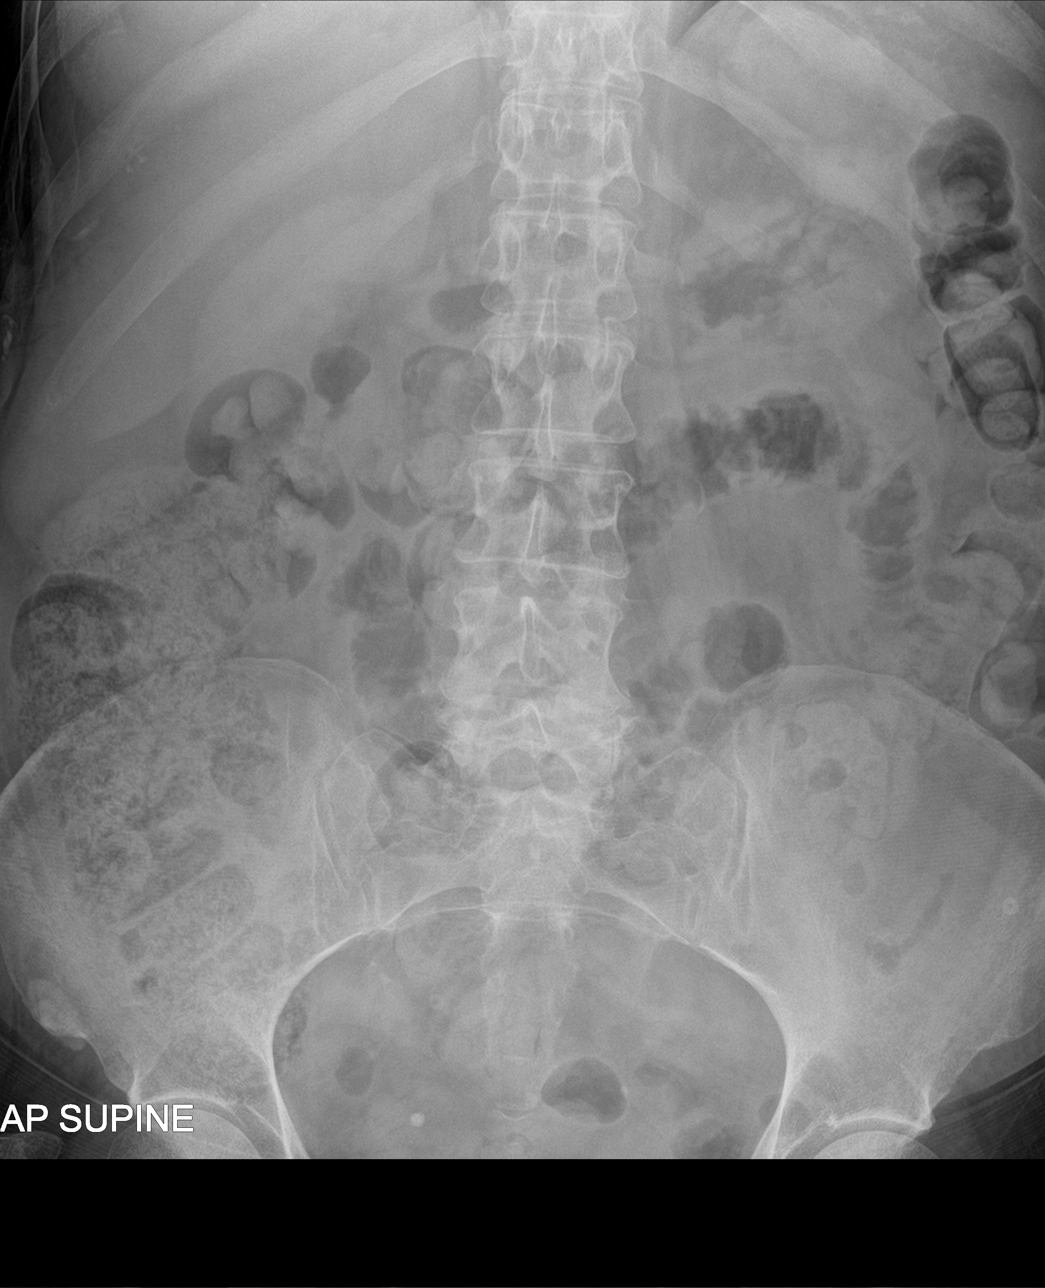

[3 of 3 positions shown; findings below may reference images not displayed]

FINDINGS: The lungs are well-aerated. Vascular congestion is noted. Mildly
increased interstitial markings may reflect atelectasis or minimal
interstitial edema. There is no evidence of pleural effusion or
pneumothorax. The cardiomediastinal silhouette is borderline normal
in size.

The visualized bowel gas pattern is unremarkable. Scattered stool
and air are seen within the colon; there is no evidence of small
bowel dilatation to suggest obstruction. No free intra-abdominal air
is identified on the provided upright view.

No acute osseous abnormalities are seen; the sacroiliac joints are
unremarkable in appearance.
IMPRESSION: 1. Unremarkable bowel gas pattern; no free intra-abdominal air seen.
Moderate amount of stool noted in the colon.
2. Vascular congestion noted. Mildly increased interstitial markings
may reflect atelectasis or minimal interstitial edema.

## 2017-02-17 ENCOUNTER — Encounter (HOSPITAL_COMMUNITY): Payer: Self-pay

## 2017-02-17 ENCOUNTER — Emergency Department (HOSPITAL_COMMUNITY)
Admission: EM | Admit: 2017-02-17 | Discharge: 2017-02-17 | Disposition: A | Discharge status: Home / Self Care | Payer: Self-pay | Attending: Emergency Medicine | Admitting: Emergency Medicine

## 2017-02-17 DIAGNOSIS — M069 Rheumatoid arthritis, unspecified: Secondary | ICD-10-CM | POA: Insufficient documentation

## 2017-02-17 DIAGNOSIS — I10 Essential (primary) hypertension: Secondary | ICD-10-CM | POA: Insufficient documentation

## 2017-02-17 DIAGNOSIS — Z79899 Other long term (current) drug therapy: Secondary | ICD-10-CM | POA: Insufficient documentation

## 2017-02-17 DIAGNOSIS — J449 Chronic obstructive pulmonary disease, unspecified: Secondary | ICD-10-CM | POA: Insufficient documentation

## 2017-02-17 DIAGNOSIS — F17210 Nicotine dependence, cigarettes, uncomplicated: Secondary | ICD-10-CM | POA: Insufficient documentation

## 2017-02-17 MED ORDER — DEXAMETHASONE SODIUM PHOSPHATE 4 MG/ML IJ SOLN
10.0000 mg | Freq: Once | INTRAMUSCULAR | Status: AC
  Administered 2017-02-17: 10 mg via INTRAMUSCULAR
  Filled 2017-02-17: qty 3

## 2017-02-17 MED ORDER — NAPROXEN 250 MG PO TABS
500.0000 mg | ORAL_TABLET | Freq: Once | ORAL | Status: AC
  Administered 2017-02-17: 500 mg via ORAL
  Filled 2017-02-17: qty 2

## 2017-02-17 MED ORDER — PREDNISONE 10 MG (21) PO TBPK: ORAL_TABLET | Freq: Every day | ORAL | 0 refills | Status: DC

## 2017-02-17 NOTE — ED Provider Notes (Signed)
[facility] EMERGENCY DEPARTMENT Provider Note   CSN: [id number] Arrival date & time: 02/17/17  0745     History   Chief Complaint Chief Complaint  Patient presents with  . pain    HPI [name] is a 52 y.o. female.  HPI   51yF with joint pain. Hx of rheumatoid arthritis and thinks she is having a flare. She is requesting "steroids and anti-inflammatories. Pain medicine does nothing for this." Has not taken her typical meds in about 6 weeks but reports upcoming appointment with rheumatology. No fevers. No rash.   Past Medical History:  Diagnosis Date  . Arthritis, rheumatoid (HCC)   . Chronic anemia   . Chronic pain   . COPD (chronic obstructive pulmonary disease) (HCC)   . Depression   . Hypertension   . Incidental lung nodule   . Osteoporosis   . Rheumatoid arthritis(714.0)   . Trichimoniasis     Patient Active Problem List   Diagnosis Date Noted  . Trichomonas vaginitis 04/23/2016  . HSIL (high grade squamous intraepithelial lesion) on Pap smear of cervix 04/23/2016  . Hypokalemia 05/05/2015  . Absolute anemia 05/05/2015  . Anxiety disorder 05/05/2015  . Fracture, cuboid 03/05/2015  . Pathological fracture of pelvis 03/05/2015  . Pathological fracture of pelvis due to osteoporosis (HCC) 02/20/2015  . Osteoporosis 02/20/2015  . Chronic pain 02/04/2015  . Esophageal reflux 02/04/2015  . Subclinical hyperthyroidism 02/04/2015  . Gastritis 01/07/2015  . Rheumatoid arthritis (HCC) 11/19/2014  . Cigarette nicotine dependence, uncomplicated 11/14/2014  . Hyperlipidemia 11/14/2014    Past Surgical History:  Procedure Laterality Date  . BREAST CYST EXCISION  12/31/2010   Procedure: CYST EXCISION BREAST;  Surgeon: Fabio Bering, MD;  Location: [facility];  Service: General;  Laterality: Right;  Excision Sebaceous Cyst Right Breast  . ENDOMETRIAL ABLATION    . TUBAL LIGATION      OB History    Gravida Para Term Preterm AB Living   6 5 5   1 5    SAB TAB  Ectopic Multiple Live Births   1               Home Medications    Prior to Admission medications   Medication Sig Start Date End Date Taking? Authorizing Provider  alendronate (FOSAMAX) 70 MG tablet Take 70 mg by mouth once a week. Take with a full glass of water on an empty stomach.(Monday)    [provider]  atorvastatin (LIPITOR) 20 MG tablet Take 1 tablet (20 mg total) by mouth daily. 02/18/16   04/17/16, PA-C  cyclobenzaprine (FLEXERIL) 10 MG tablet Take 1 tablet (10 mg total) by mouth 3 (three) times daily as needed. 12/16/16   Triplett, Tammy, PA-C  folic acid (FOLVITE) 1 MG tablet Take 1 mg by mouth daily.    [provider]  HYDROcodone-acetaminophen (NORCO/VICODIN) 5-325 MG tablet Take one tab po q 4 hrs prn pain 12/16/16   Triplett, Tammy, PA-C  IRON PO Take 1 tablet by mouth daily.    [provider]  methotrexate (RHEUMATREX) 2.5 MG tablet Take 20 mg by mouth once a week. Caution:Chemotherapy. Protect from light.(Thursday)    [provider]  metroNIDAZOLE (FLAGYL) 500 MG tablet Take 1 tablet (500 mg total) by mouth 2 (two) times daily. 04/23/16   06/23/16, MD  Multiple Vitamin (MULTIVITAMIN WITH MINERALS) TABS Take 1 tablet by mouth daily. Reported on 05/02/2015    [provider]  naproxen (NAPROSYN) 500  MG tablet Take 500 mg by mouth 2 (two) times daily with a meal.    [provider]  omeprazole (PRILOSEC) 40 MG capsule Take 1 capsule (40 mg total) by mouth daily. 02/18/16   Jacquelin Hawking, PA-C  predniSONE (DELTASONE) 5 MG tablet Take 5 mg by mouth daily. 09/04/16   [provider]    Family History Family History  Problem Relation Age of Onset  . Cancer Father        lung  . Hypertension Mother   . Hypertension Daughter   . Anesthesia problems Neg Hx     Social History Social History   Tobacco Use  . Smoking status: Current Every Day Smoker    Packs/day: 1.00    Years: 30.00    Pack  years: 30.00    Types: Cigarettes  . Smokeless tobacco: Never Used  Substance Use Topics  . Alcohol use: No    Alcohol/week: 0.0 oz  . Drug use: No     Allergies   Voltaren [diclofenac sodium] and Codeine   Review of Systems Review of Systems  All systems reviewed and negative, other than as noted in HPI.  Physical Exam Updated Vital Signs BP (!) 143/98 (BP Location: Left Arm)   Pulse 81   Temp 98.7 F (37.1 C) (Oral)   Resp 18   Ht 5\' 6"  (1.676 m)   Wt 68 kg (150 lb)   SpO2 100%   BMI 24.21 kg/m   Physical Exam  Constitutional: She appears well-developed and well-nourished. No distress.  HENT:  Head: Normocephalic and atraumatic.  Eyes: Conjunctivae are normal. Right eye exhibits no discharge. Left eye exhibits no discharge.  Neck: Neck supple.  Cardiovascular: Normal rate, regular rhythm and normal heart sounds. Exam reveals no gallop and no friction rub.  No murmur heard. Pulmonary/Chest: Effort normal and breath sounds normal. No respiratory distress.  Abdominal: Soft. She exhibits no distension. There is no tenderness.  Musculoskeletal: She exhibits no edema or tenderness.  Chronic appearing changes to hands consistent with RA. Some subcutaneous nodules forearms. No significant joint swelling/redness.  Neurological: She is alert.  Skin: Skin is warm and dry.  Psychiatric: She has a normal mood and affect. Her behavior is normal. Thought content normal.  Nursing note and vitals reviewed.    ED Treatments / Results  Labs (all labs ordered are listed, but only abnormal results are displayed) Labs Reviewed - No data to display  EKG  EKG Interpretation None       Radiology No results found.  Procedures Procedures (including critical care time)  Medications Ordered in ED Medications - No data to display   Initial Impression / Assessment and Plan / ED Course  I have reviewed the triage vital signs and the nursing notes.  Pertinent labs &  imaging results that were available during my care of the patient were reviewed by me and considered in my medical decision making (see chart for details).     51yF with joint pain. Acute on chronic. Likely RA flare. Doubt septic arthritis. She is requesting steroids which I think is reasonable at this point. She reports upcoming rheumatology FU.   Final Clinical Impressions(s) / ED Diagnoses   Final diagnoses:  Rheumatoid arthritis flare [facility])    ED Discharge Orders    None       [facility], MD 02/18/17 [phone]

## 2017-02-17 NOTE — ED Triage Notes (Signed)
Pt reports has rheumatoid arthritis and has been out of her meds for the past 6 weeks.  C/O pain all over.

## 2017-02-19 IMAGING — CT CT ABD-PELV W/ CM
2 of 5 series · 16 of 46 positions shown, 18 images · IV contrast (Omnipaque 300)
Comparison: CT scan of August 10, 2014.

CLINICAL DATA: Acute left lower quadrant abdominal pain.

EXAM:
CT ABDOMEN AND PELVIS WITH CONTRAST
TECHNIQUE: Multidetector CT imaging of the abdomen and pelvis was performed
using the standard protocol following bolus administration of
intravenous contrast.
CONTRAST:  100mL OMNIPAQUE IOHEXOL 300 MG/ML  SOLN

[Series 2: abd_pel_with 5.0 b40f · axial · 0.78mm/px · z∈[-459,-79]mm · 13 of 88 slices shown, 15 images]
[im 6/88  soft-tissue]
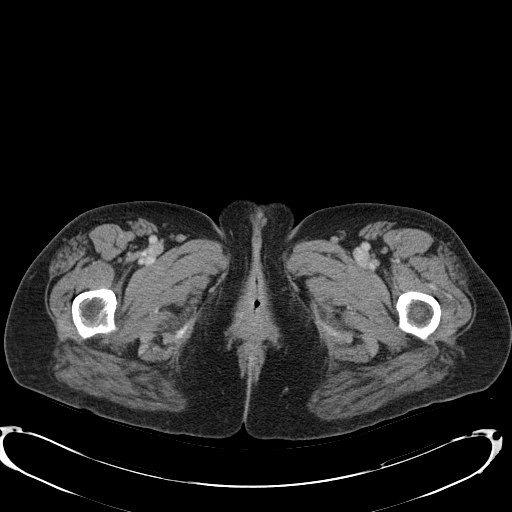
[im 6/88  bone]
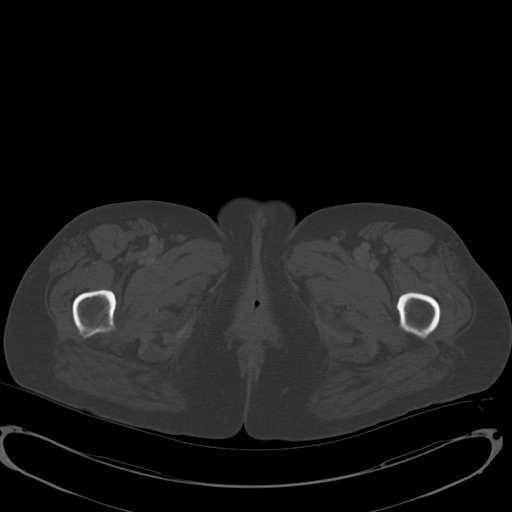
[im 11/88  soft-tissue]
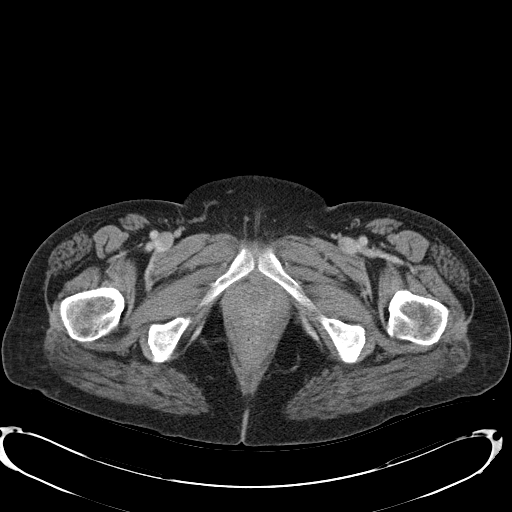
[im 21/88  soft-tissue]
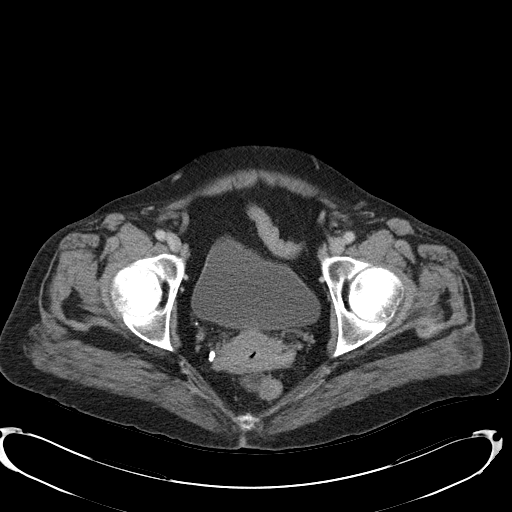
[im 26/88  soft-tissue]
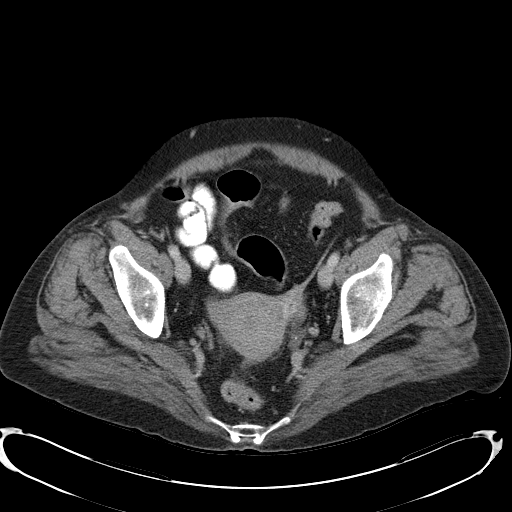
[im 31/88  soft-tissue]
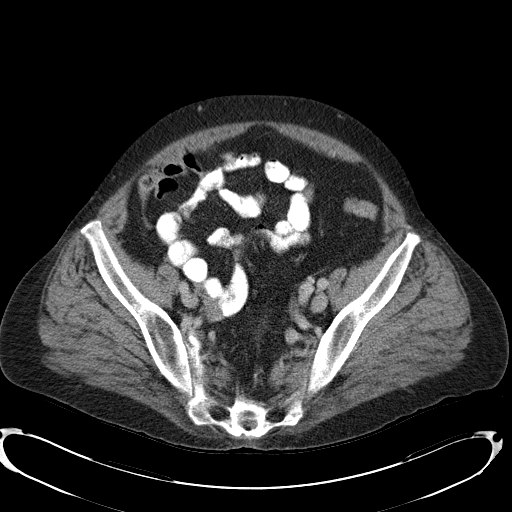
[im 36/88  soft-tissue]
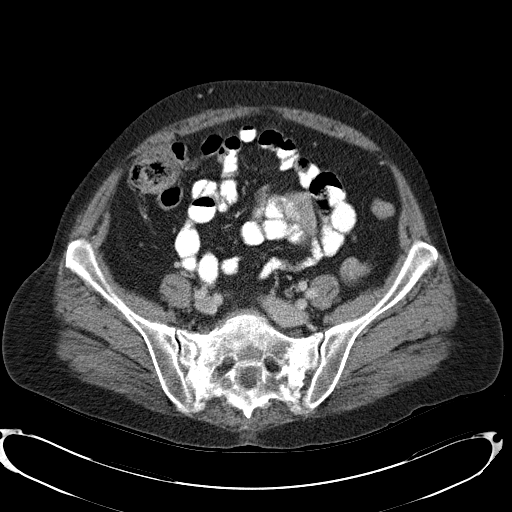
[im 47/88  soft-tissue]
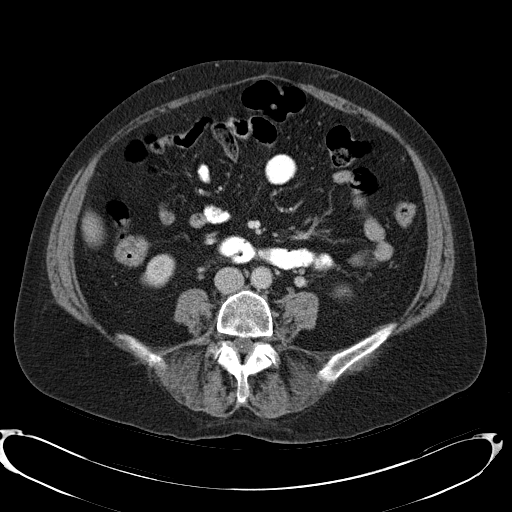
[im 52/88  soft-tissue]
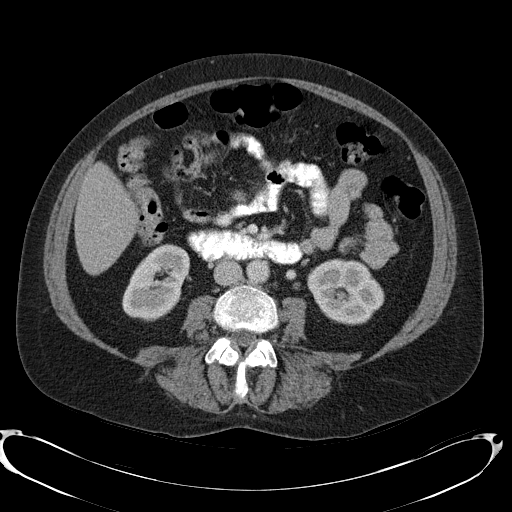
[im 57/88  soft-tissue]
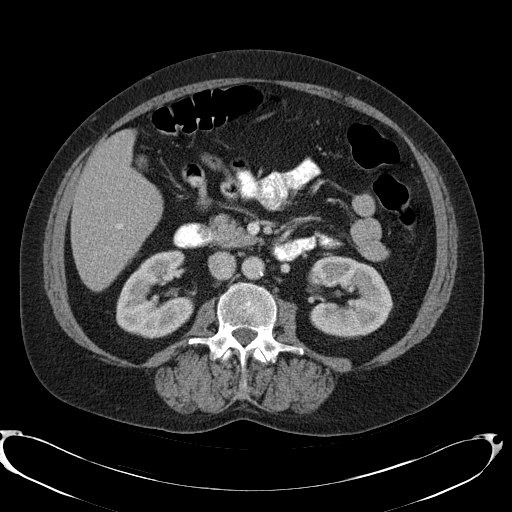
[im 57/88  bone]
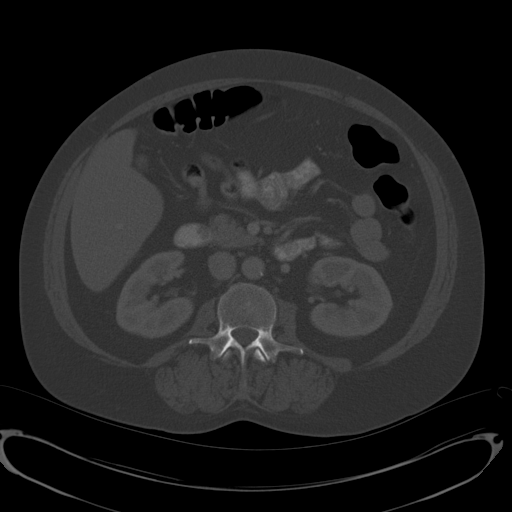
[im 62/88  soft-tissue]
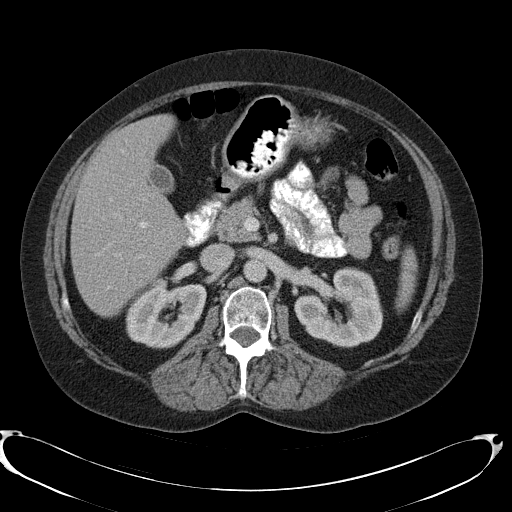
[im 67/88  soft-tissue]
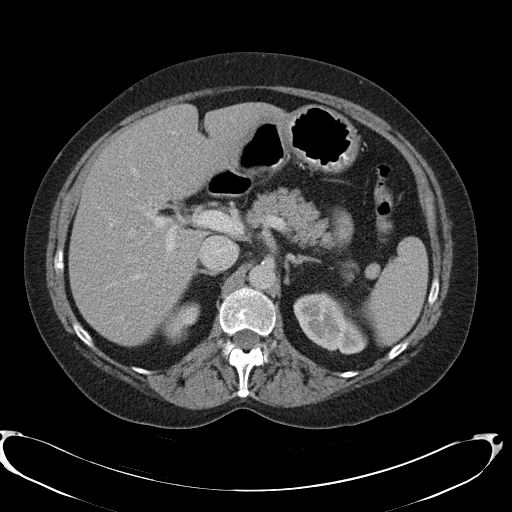
[im 77/88  soft-tissue]
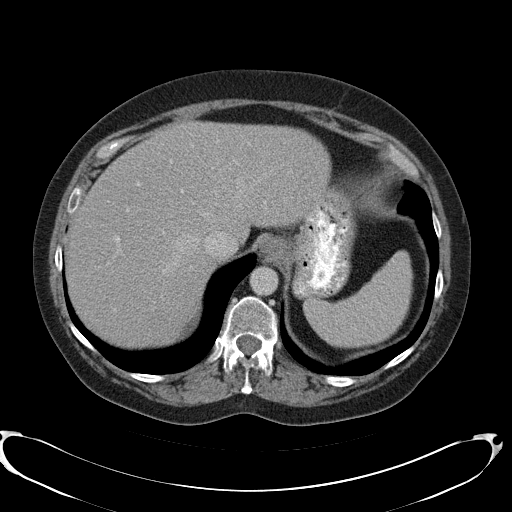
[im 82/88  soft-tissue]
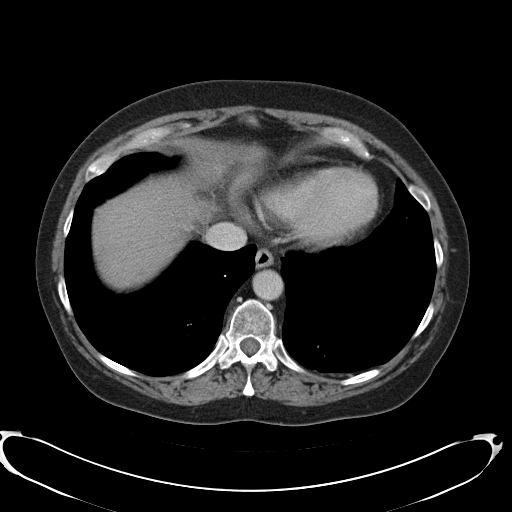

[Series 4: abd_pel_with 3.0 spo cor · coronal · 0.73mm/px · 3 of 101 slices shown]
[im 34/101  soft-tissue]
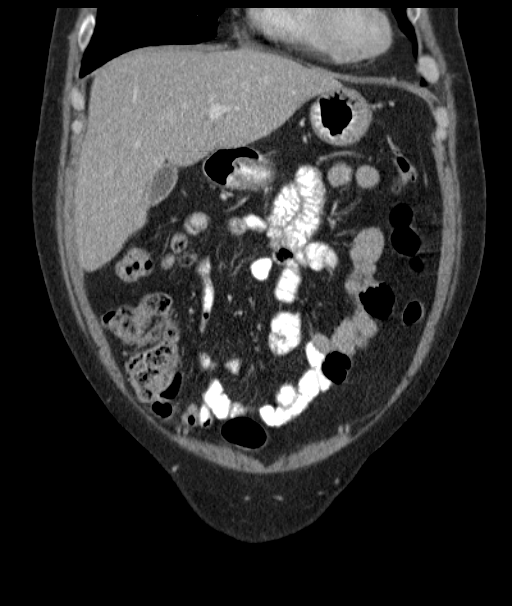
[im 45/101  soft-tissue]
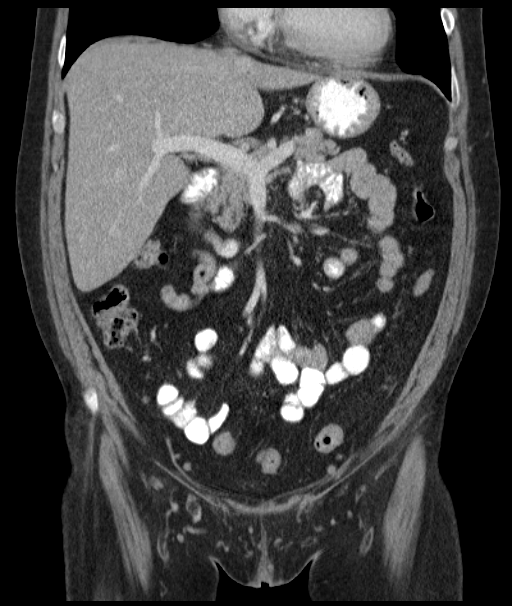
[im 56/101  soft-tissue]
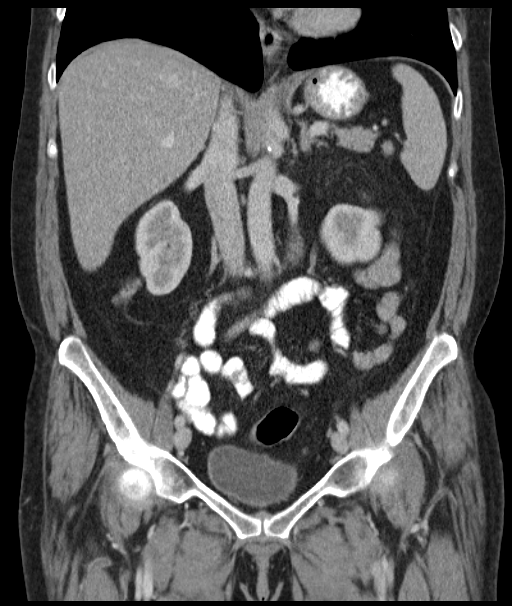

[16 of 46 positions shown; findings below may reference images not displayed]

FINDINGS: Multilevel degenerative disc disease is noted in the lower lumbar
spine. Visualized lung bases are unremarkable.

There is interval development of what appears to be acute to
subacute right sacral fracture. This may represent insufficiency
fracture.

No gallstones are noted. No focal abnormality is noted in the liver,
spleen or pancreas. Adrenal glands and kidneys appear normal. No
hydronephrosis or renal obstruction is noted. No renal or ureteral
calculi are noted. The appendix appears normal. There is no evidence
of bowel obstruction. Atherosclerosis of abdominal aorta is noted
without aneurysm formation. Urinary bladder appears normal. Uterus
and ovaries are unremarkable. No abnormal fluid collection is noted.
No significant adenopathy is noted.
IMPRESSION: Atherosclerosis of abdominal aorta without aneurysm formation.

Interval development of acute to subacute nondisplaced right sacral
fracture. This may represent insufficiency fracture.

No other significant abnormality seen in the abdomen or pelvis.

## 2017-02-28 IMAGING — DX DG SACRUM/COCCYX 2+V
4 series · 4 of 4 positions shown · non-contrast
Comparison: CT 02/07/2015

CLINICAL DATA: PAIN IN SACRUM, PATIENT STATES " SHE FELL OFF THE
TOILET IS MORNING" HISTORY OF HTN, RHEUMATOID ARTHRITIS, COPD

EXAM:
SACRUM AND COCCYX - 2+ VIEW

[coccyx ap]
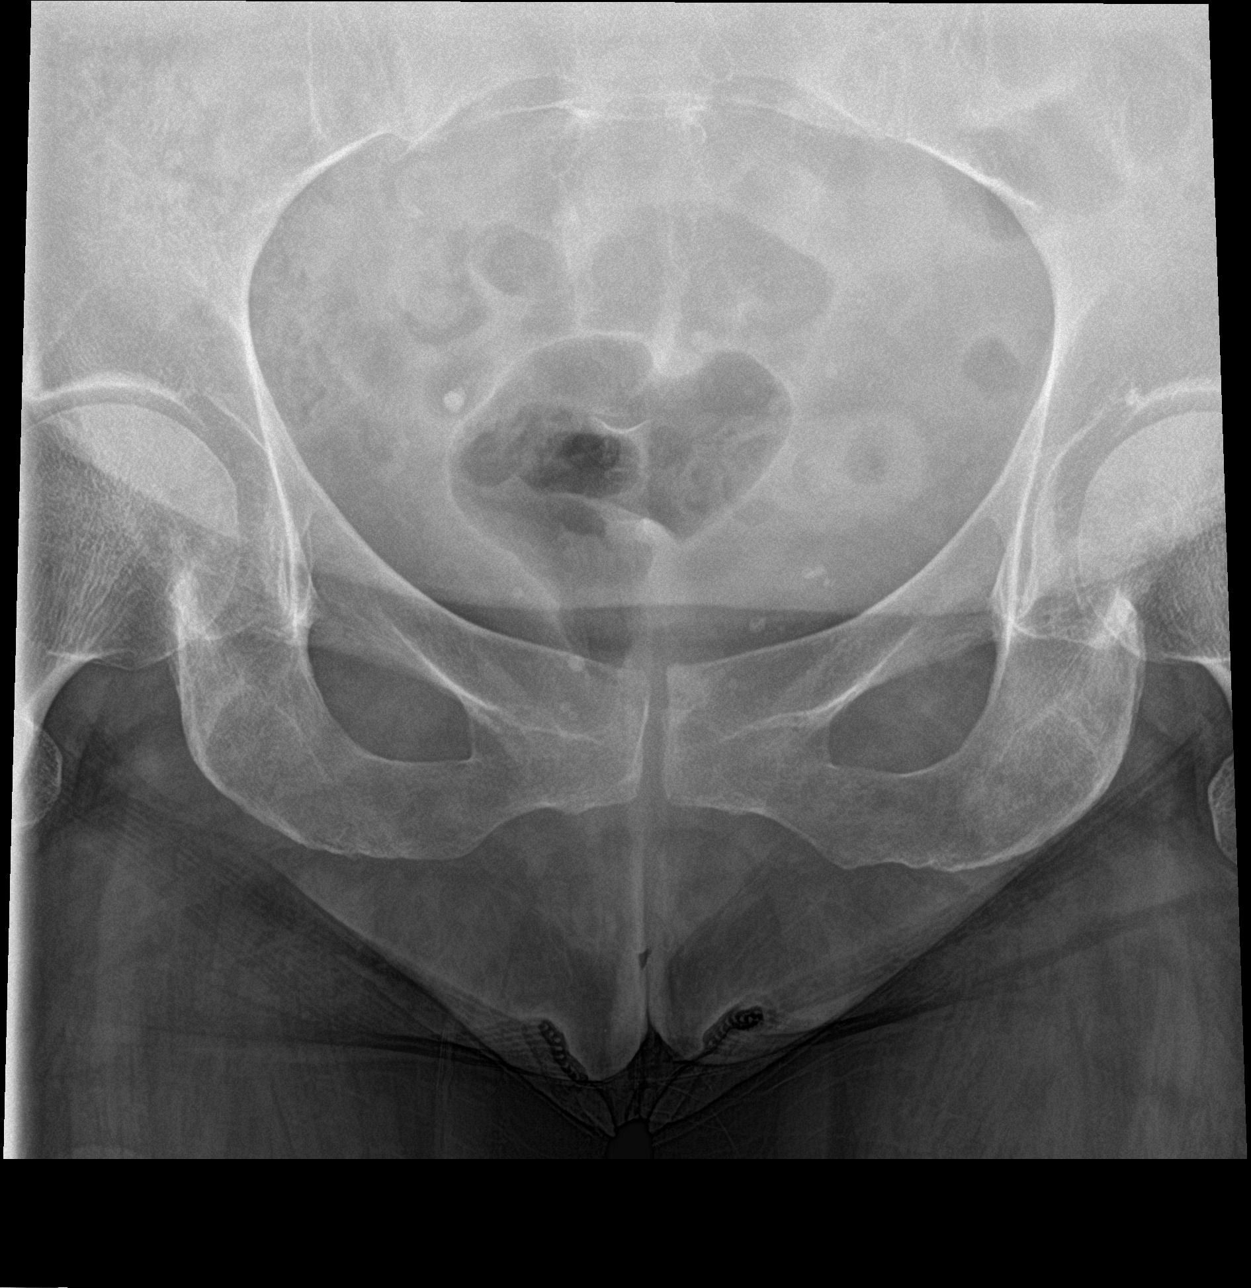

[sacrum ap]
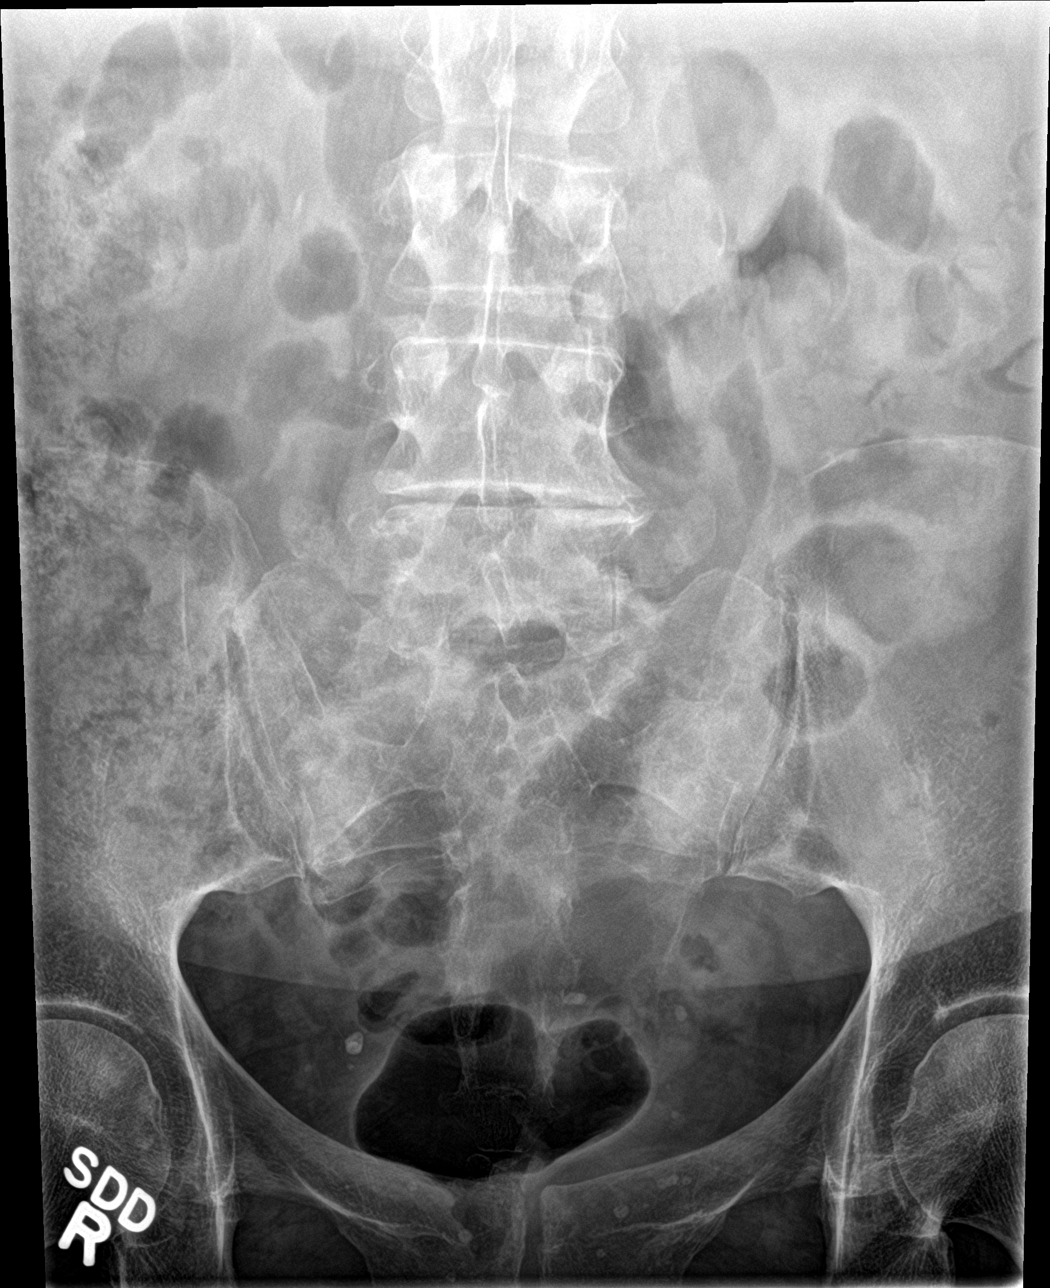

[sacrum lat (1 of 2)]
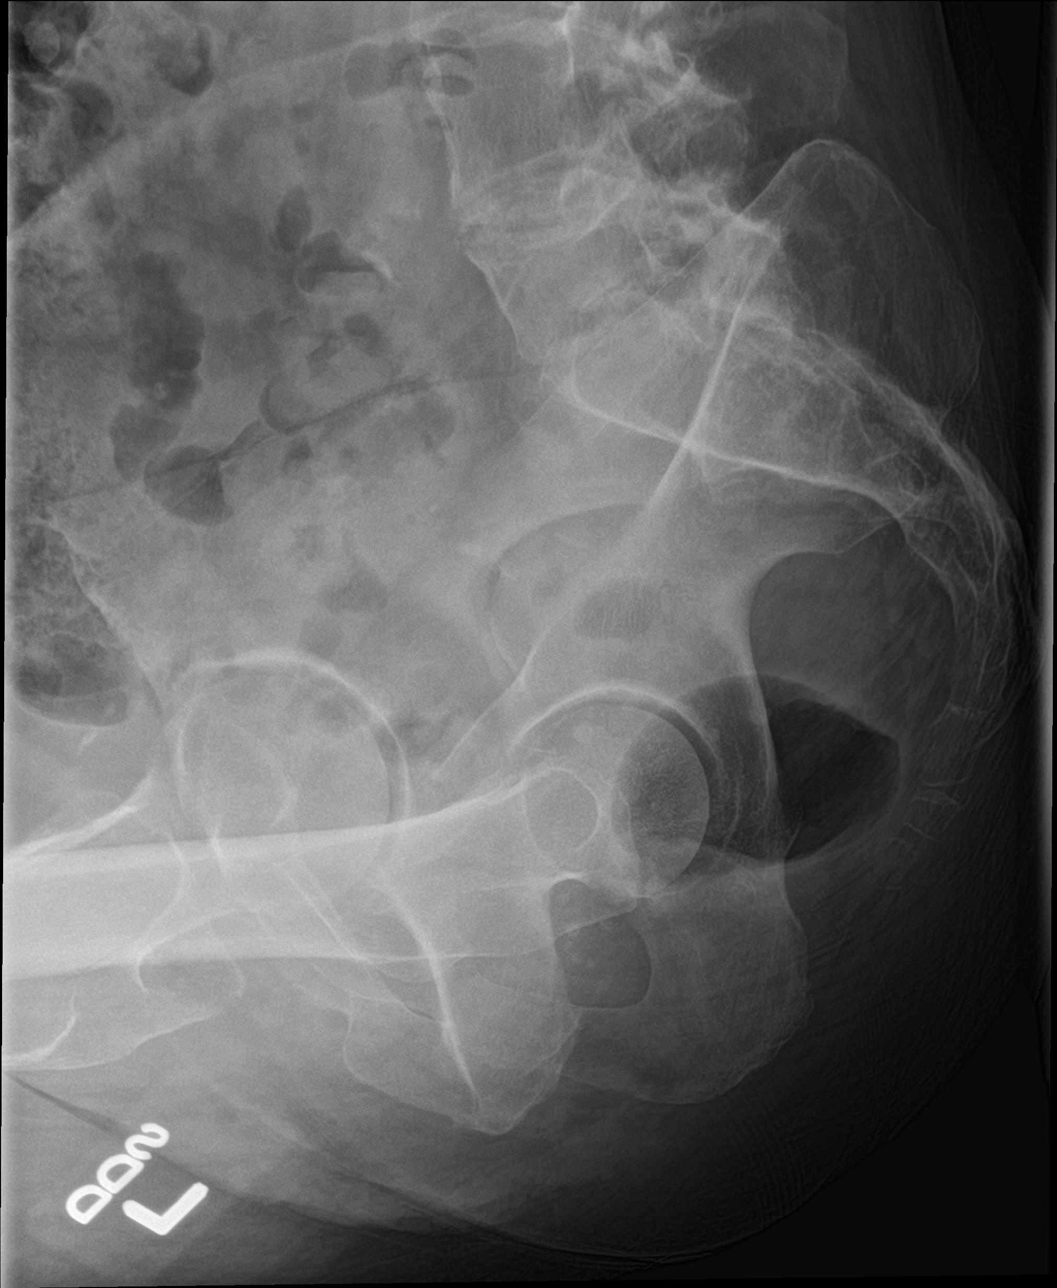

[sacrum lat (2 of 2)]
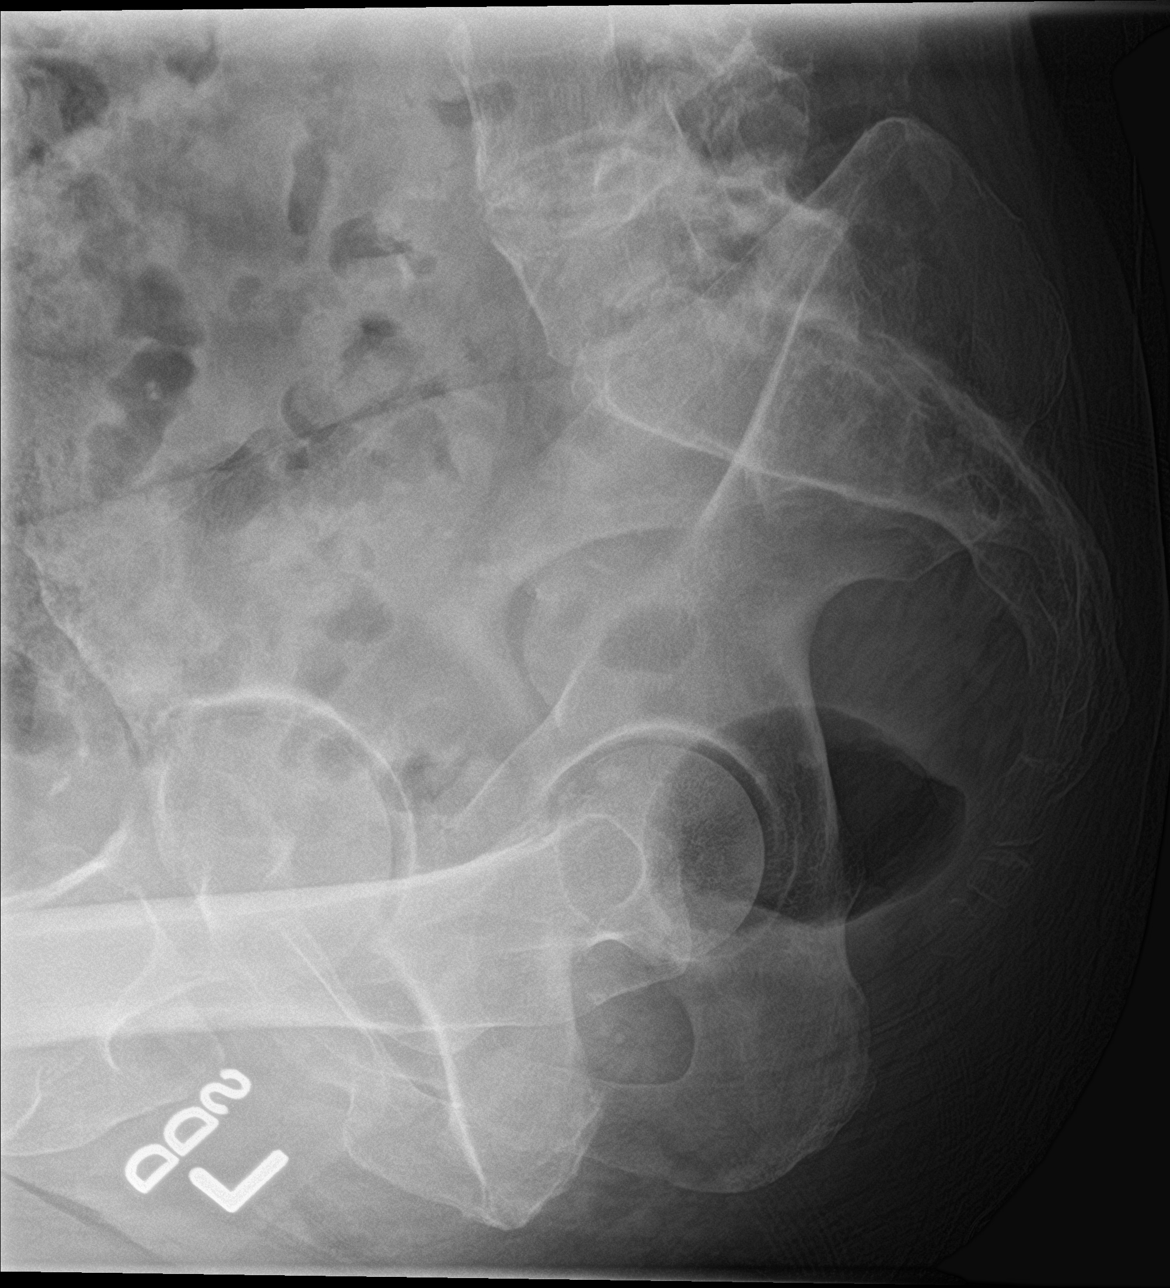

[4 of 4 positions shown; findings below may reference images not displayed]

FINDINGS: There is no evidence of fracture or other focal bone lesions.
Bilateral pelvic phleboliths. Degenerative disc disease L4- S1,
chronic.
IMPRESSION: Negative sacrum/coccyx

## 2017-02-28 IMAGING — DX DG PELVIS 1-2V
1 series · 1 of 1 positions shown · non-contrast
Comparison: CT 02/07/2015

CLINICAL DATA: PAIN IN SACRUM, PATIENT STATES " SHE FELL OFF THE
TOILET IS MORNING" HISTORY OF HTN, RHEUMATOID ARTHRITIS, COPD

EXAM:
PELVIS - 1-2 VIEW

[pelvis ap]
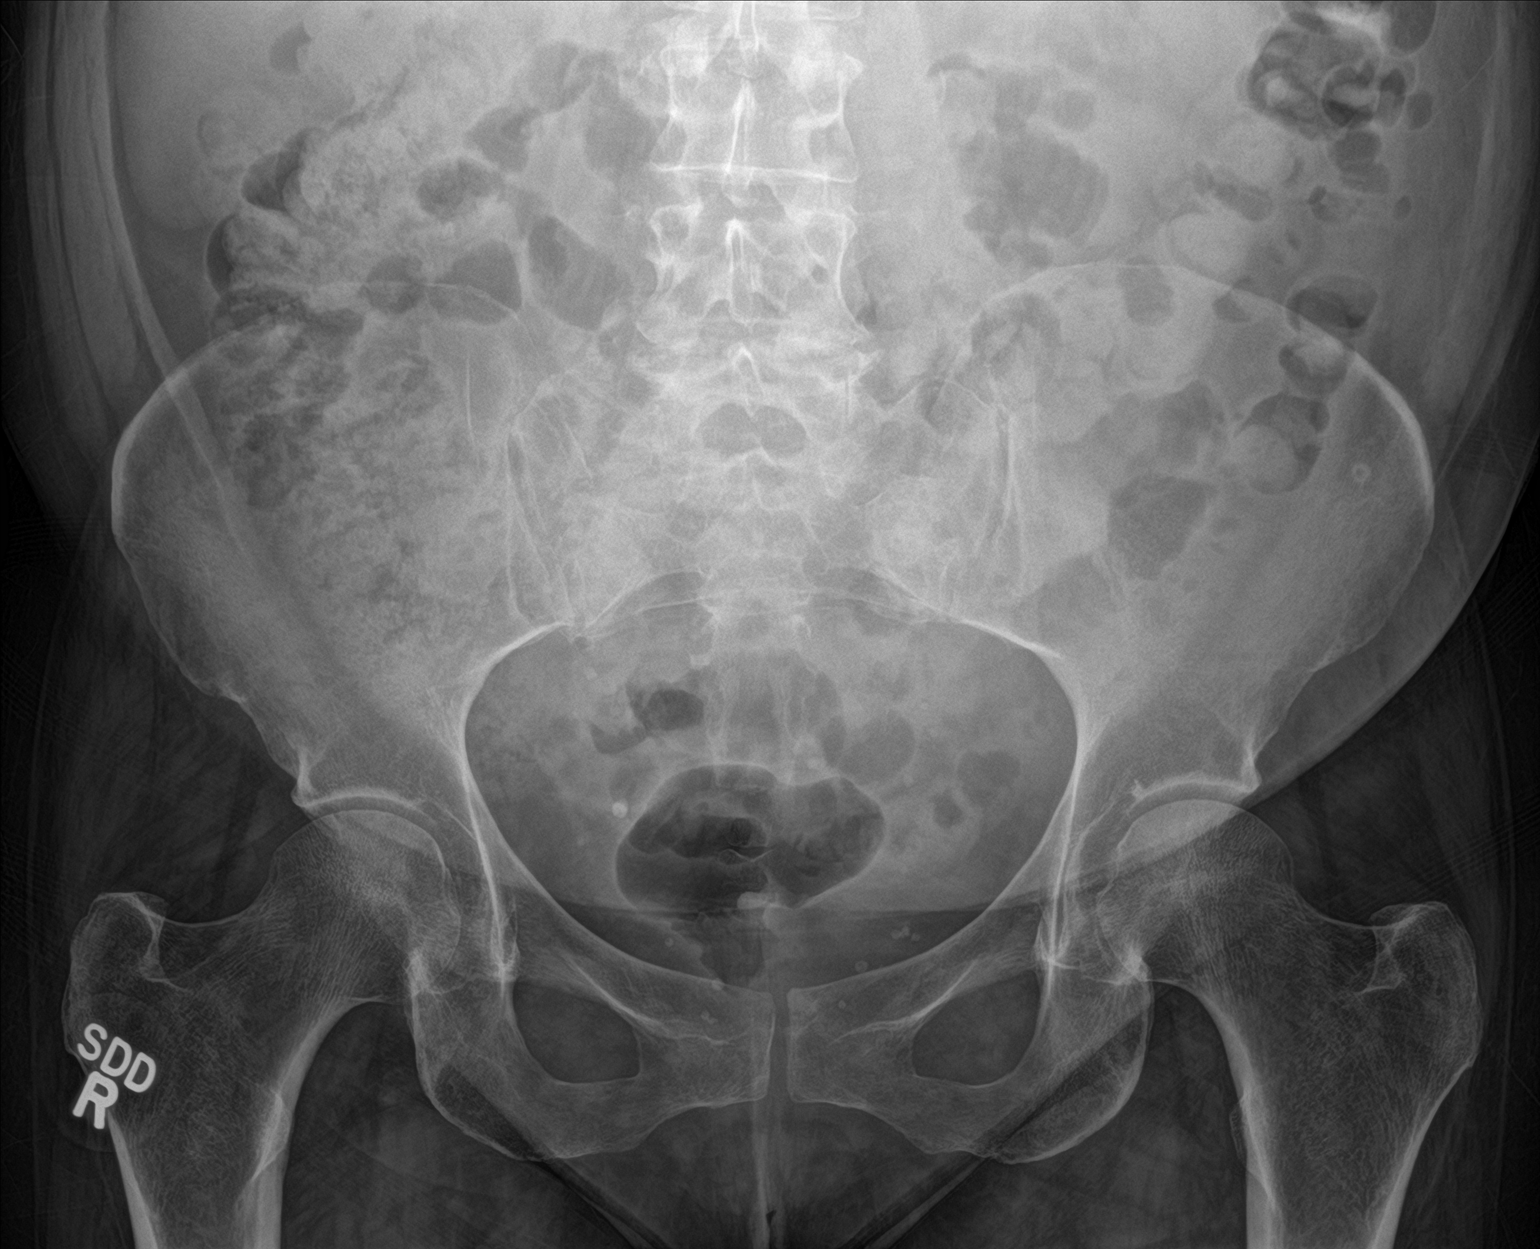

[1 of 1 positions shown; findings below may reference images not displayed]

FINDINGS: There is no evidence of pelvic fracture or diastasis. No pelvic bone
lesions are seen. Bilateral pelvic phleboliths.
IMPRESSION: Negative.

## 2017-03-11 IMAGING — DX DG FOOT COMPLETE 3+V*L*
3 series · 3 of 3 positions shown · non-contrast
Comparison: Radiographs 06/19/2014.

CLINICAL DATA: Left ankle and foot pain with swelling for 3 days.
No known injury. History of rheumatoid arthritis.

EXAM:
LEFT FOOT - COMPLETE 3+ VIEW

[foot ap]
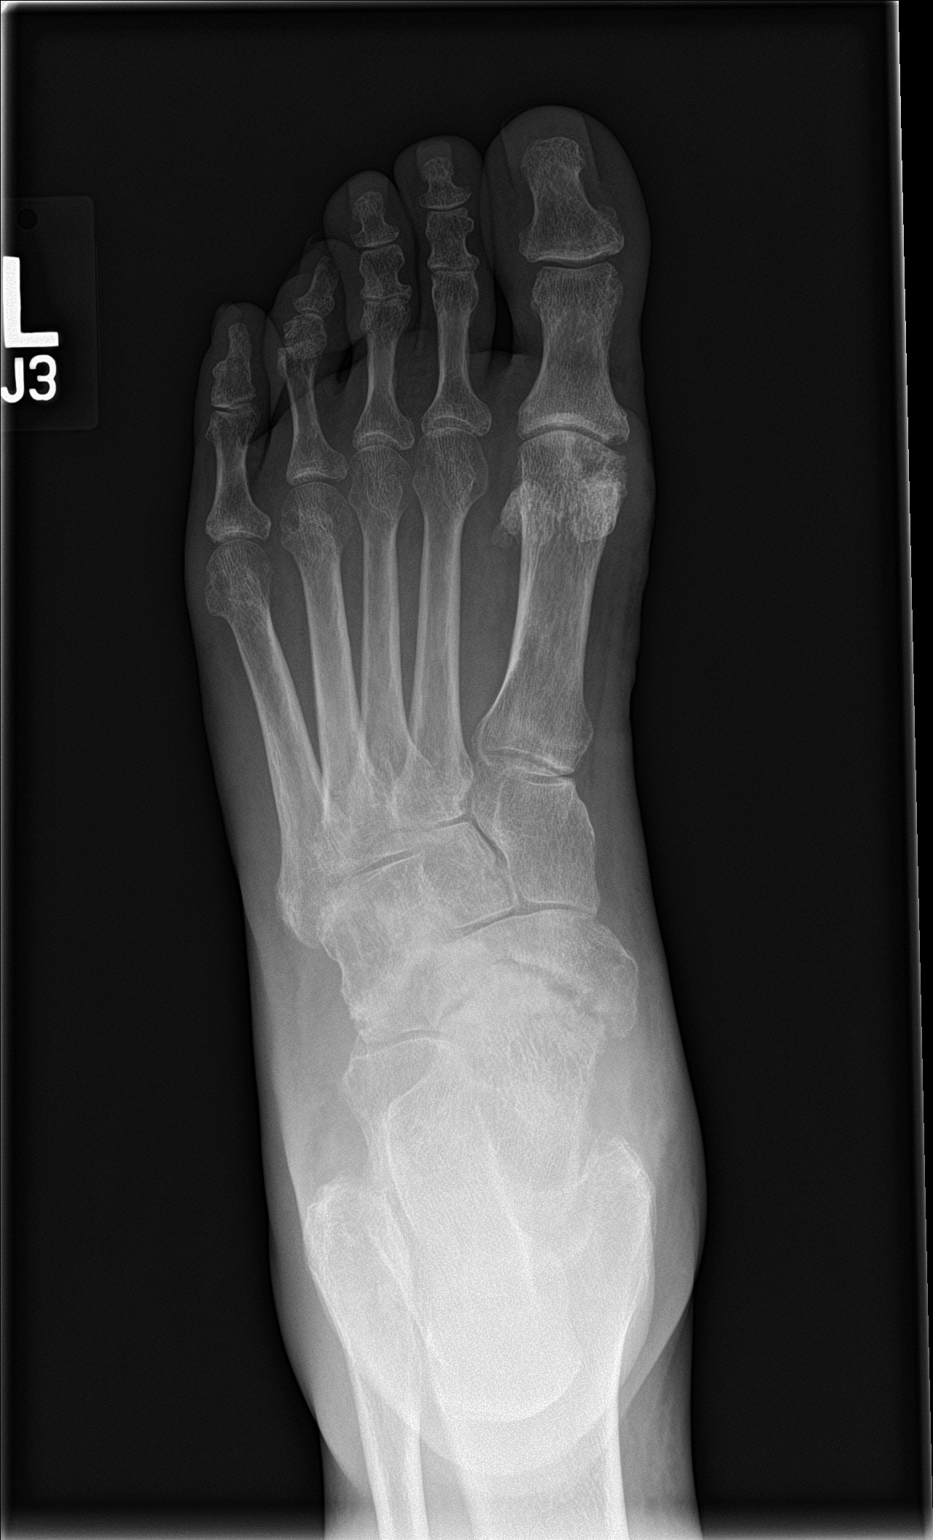

[foot obl]
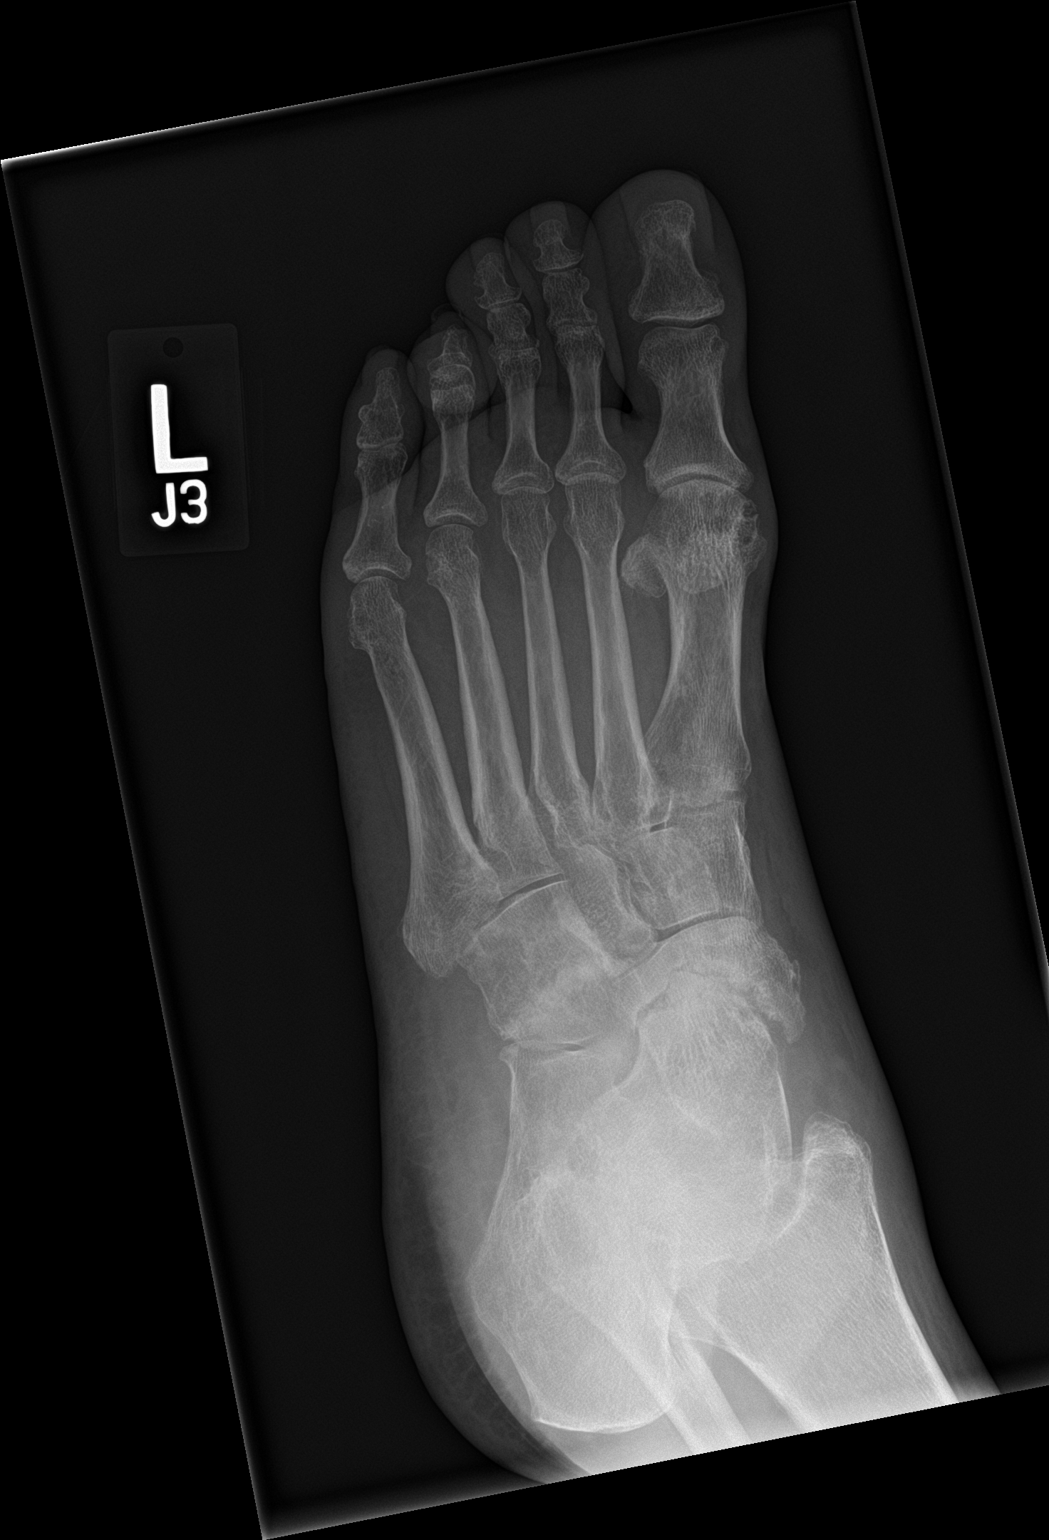

[foot lat]
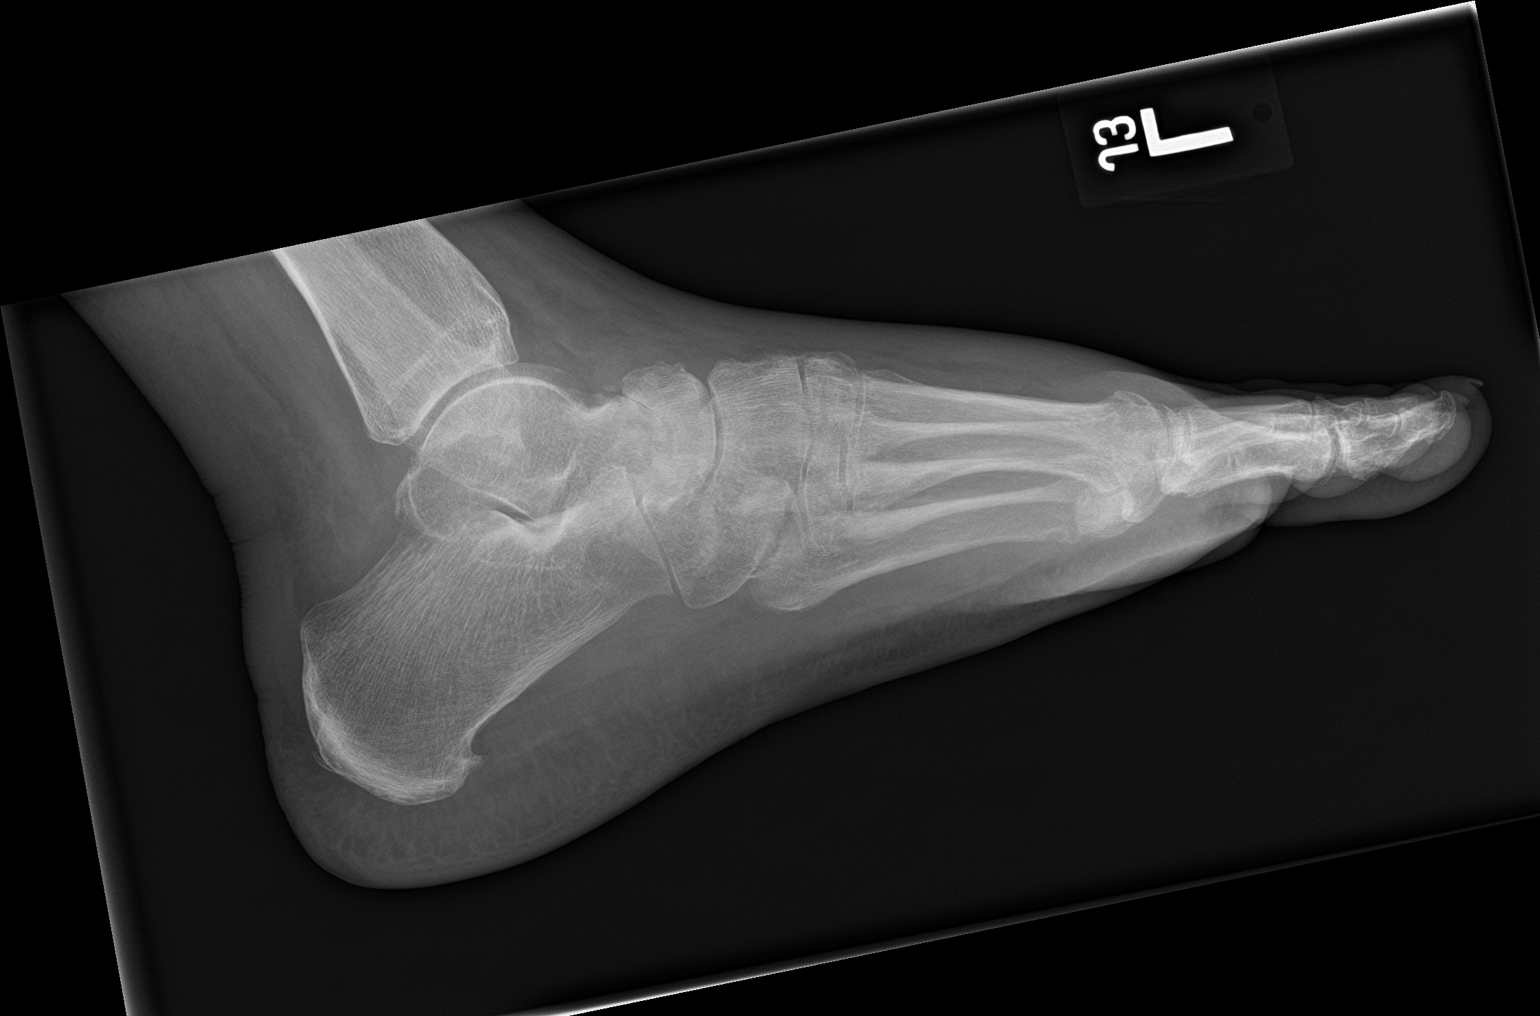

[3 of 3 positions shown; findings below may reference images not displayed]

FINDINGS: The bones are demineralized. There are stable erosive changes at the
first MTP joint and within the fourth metatarsal head. The alignment
is normal at the Lisfranc joint. There is markedly progressive
talonavicular joint space loss with joint space irregularity and
mild subchondral collapse. There is new sclerosis within the cuboid
bone, suspicious for an insufficiency fracture. Mild soft tissue
swelling is present within the dorsum of the forefoot and midfoot.
IMPRESSION: 1. Marked progression in talonavicular arthropathy. This may be
secondary to the patient's underlying rheumatoid arthritis. Septic
joint cannot be excluded.
2. New linear sclerosis within the cuboid, suspicious for an
insufficiency fracture.

## 2017-03-11 IMAGING — DX DG ANKLE COMPLETE 3+V*L*
3 series · 3 of 3 positions shown · non-contrast
Comparison: Radiographs 09/18/2014 and 06/19/2014

CLINICAL DATA: Left ankle and foot pain with swelling for 3 days.
No known injury.

EXAM:
LEFT ANKLE COMPLETE - 3+ VIEW

[ankle ap]
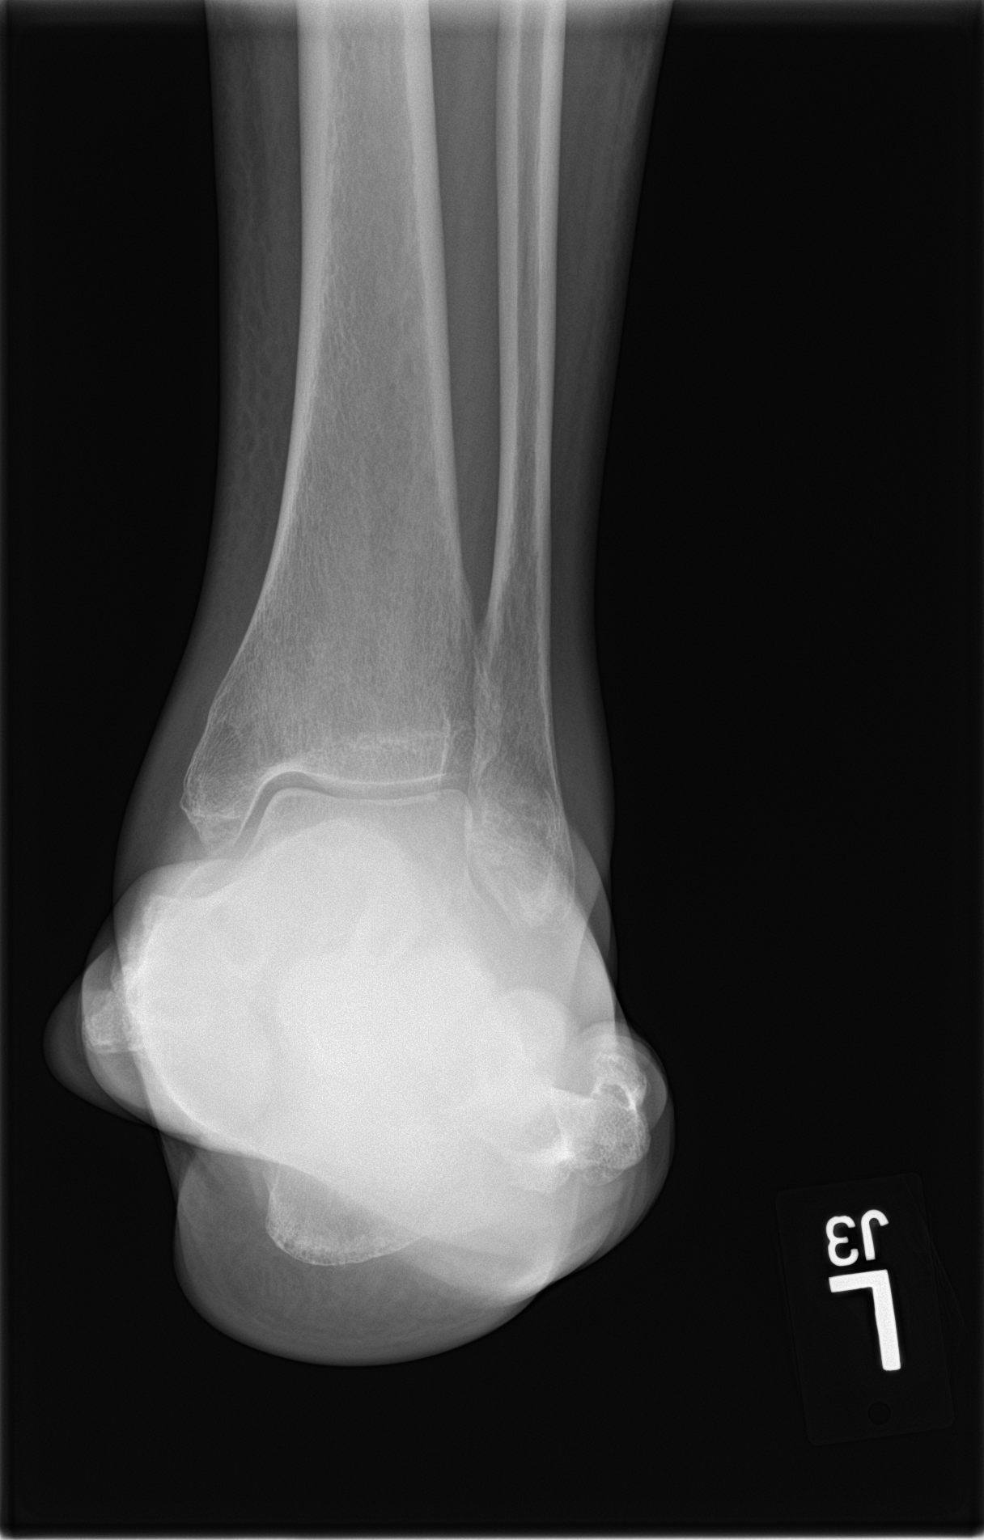

[ankle obl]
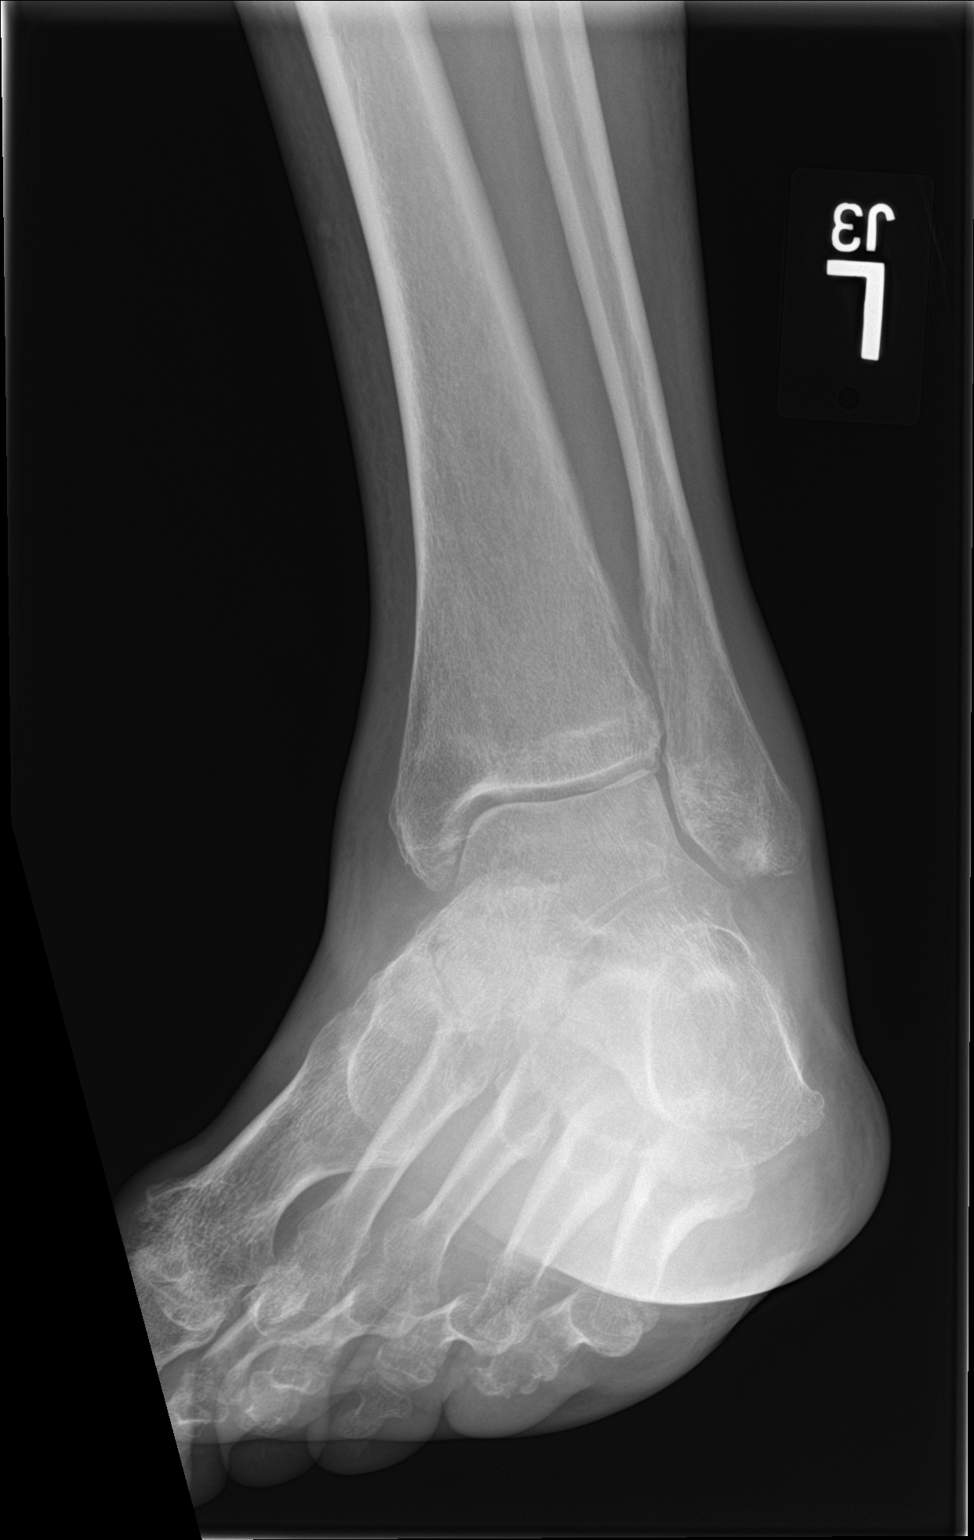

[ankle lat]
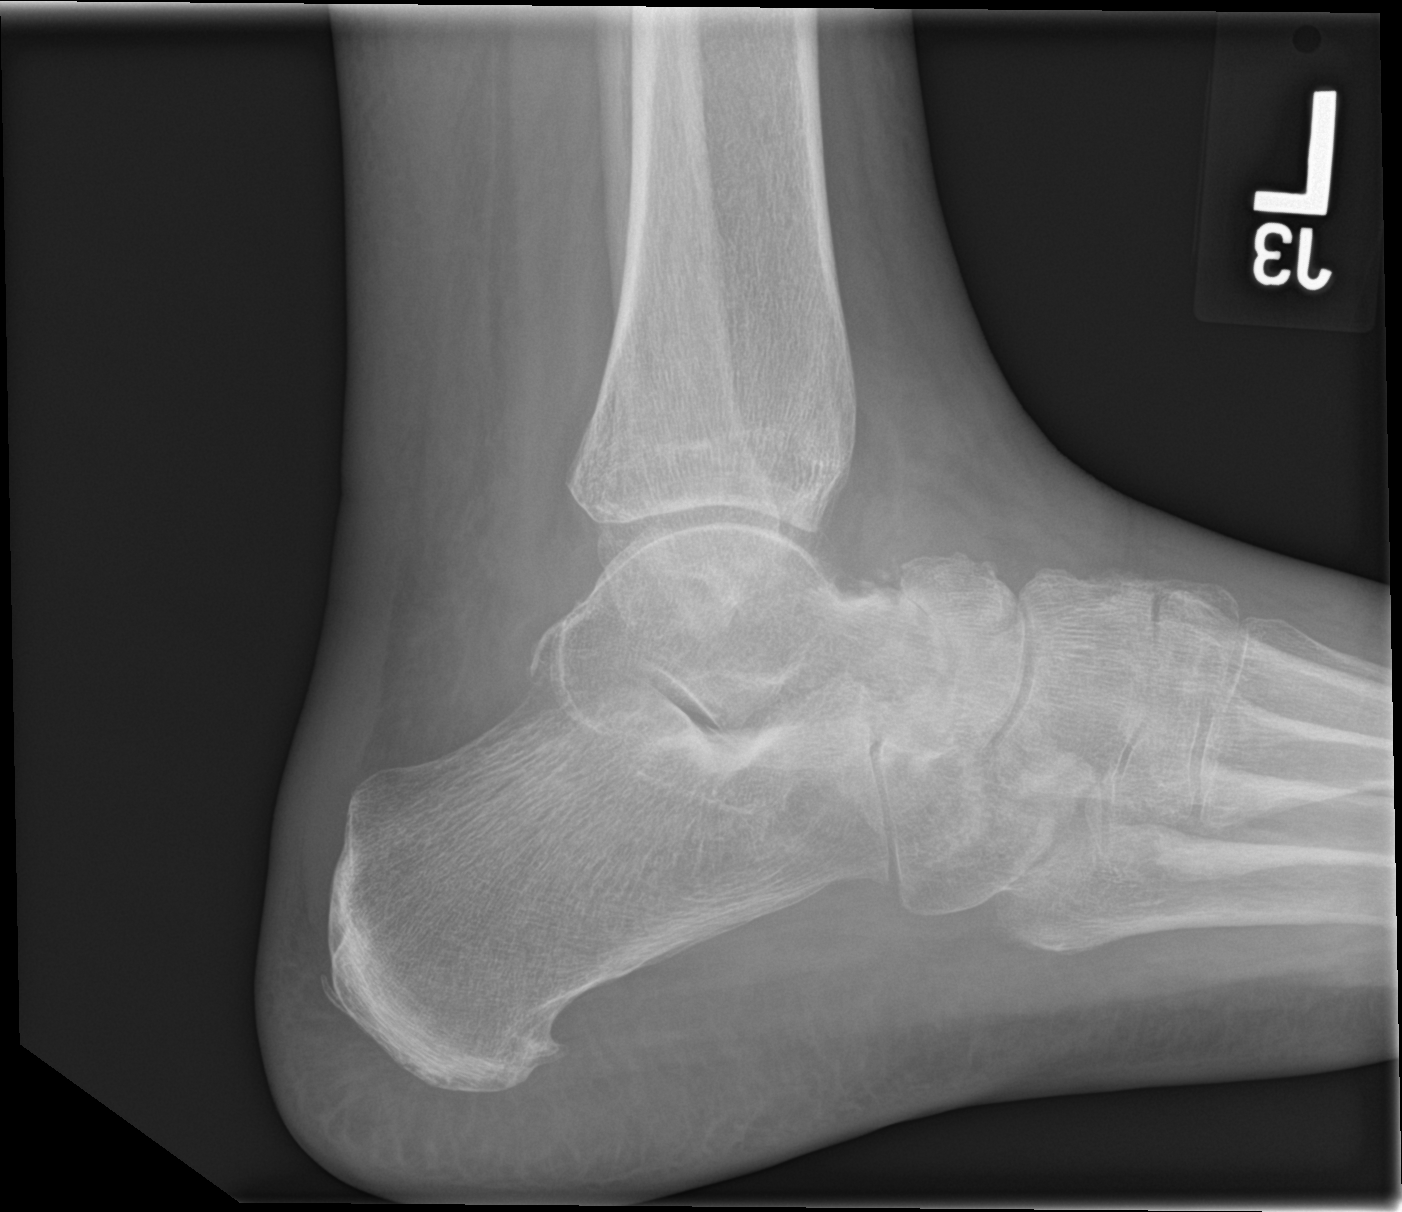

[3 of 3 positions shown; findings below may reference images not displayed]

FINDINGS: The bones are demineralized. There is no evidence of acute fracture
or dislocation. The ankle joint is maintained. The talar dome and
tibial plafond appear normal. There are progressive arthropathic
changes at the talonavicular joint, further described on the foot
radiographs. There is a probable ankle joint effusion. The soft
tissues surrounding the ankle are mildly prominent.
IMPRESSION: No acute osseous findings at the ankle. Progressive talonavicular
arthropathic changes, further described on separate report of the
foot.

## 2017-03-13 IMAGING — DX DG FOOT COMPLETE 3+V*R*
3 series · 3 of 3 positions shown · non-contrast
Comparison: None.

CLINICAL DATA: Acute left foot pain and swelling after twisting
injury today. Initial encounter.

EXAM:
RIGHT FOOT COMPLETE - 3+ VIEW

[foot ap]
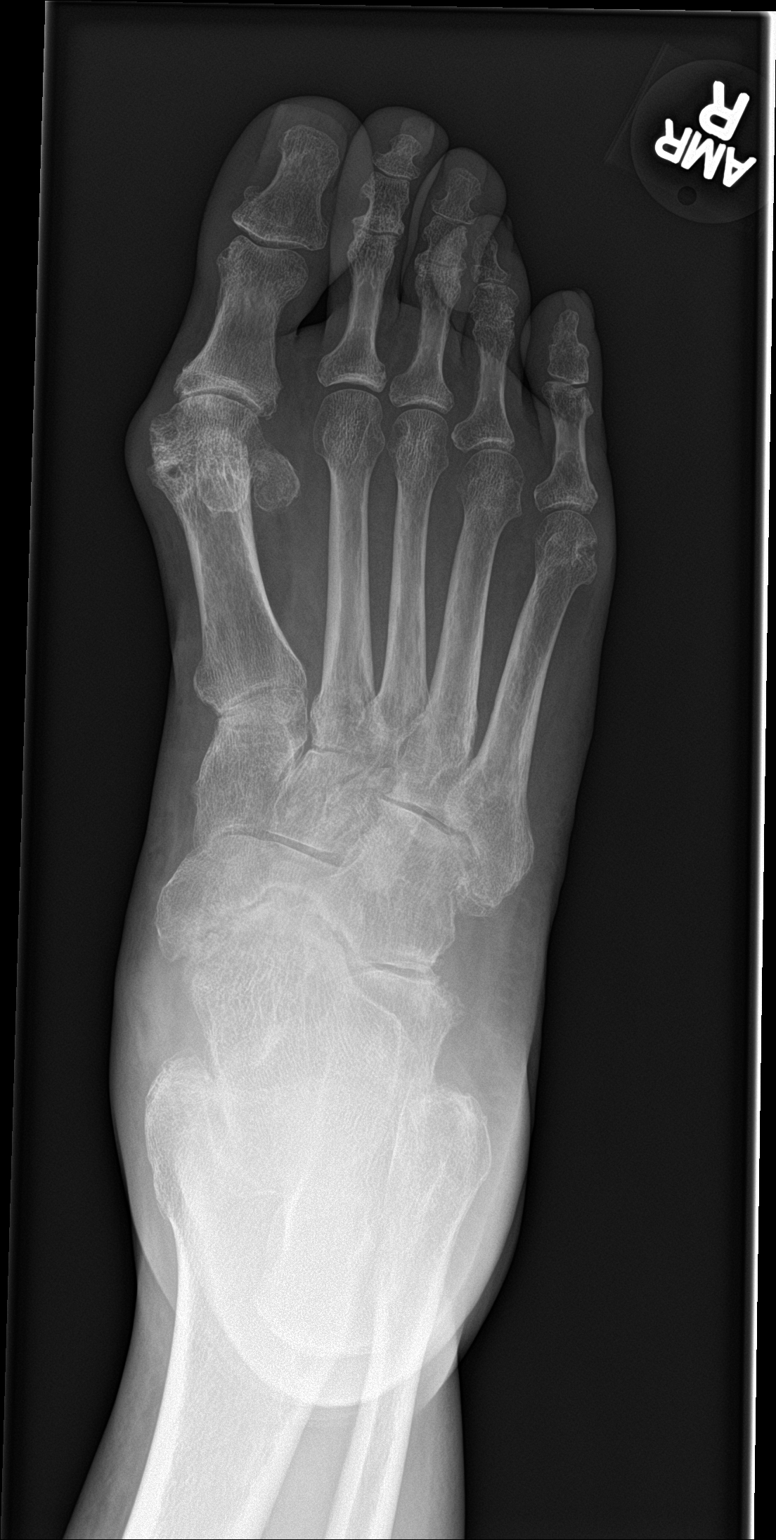

[foot obl]
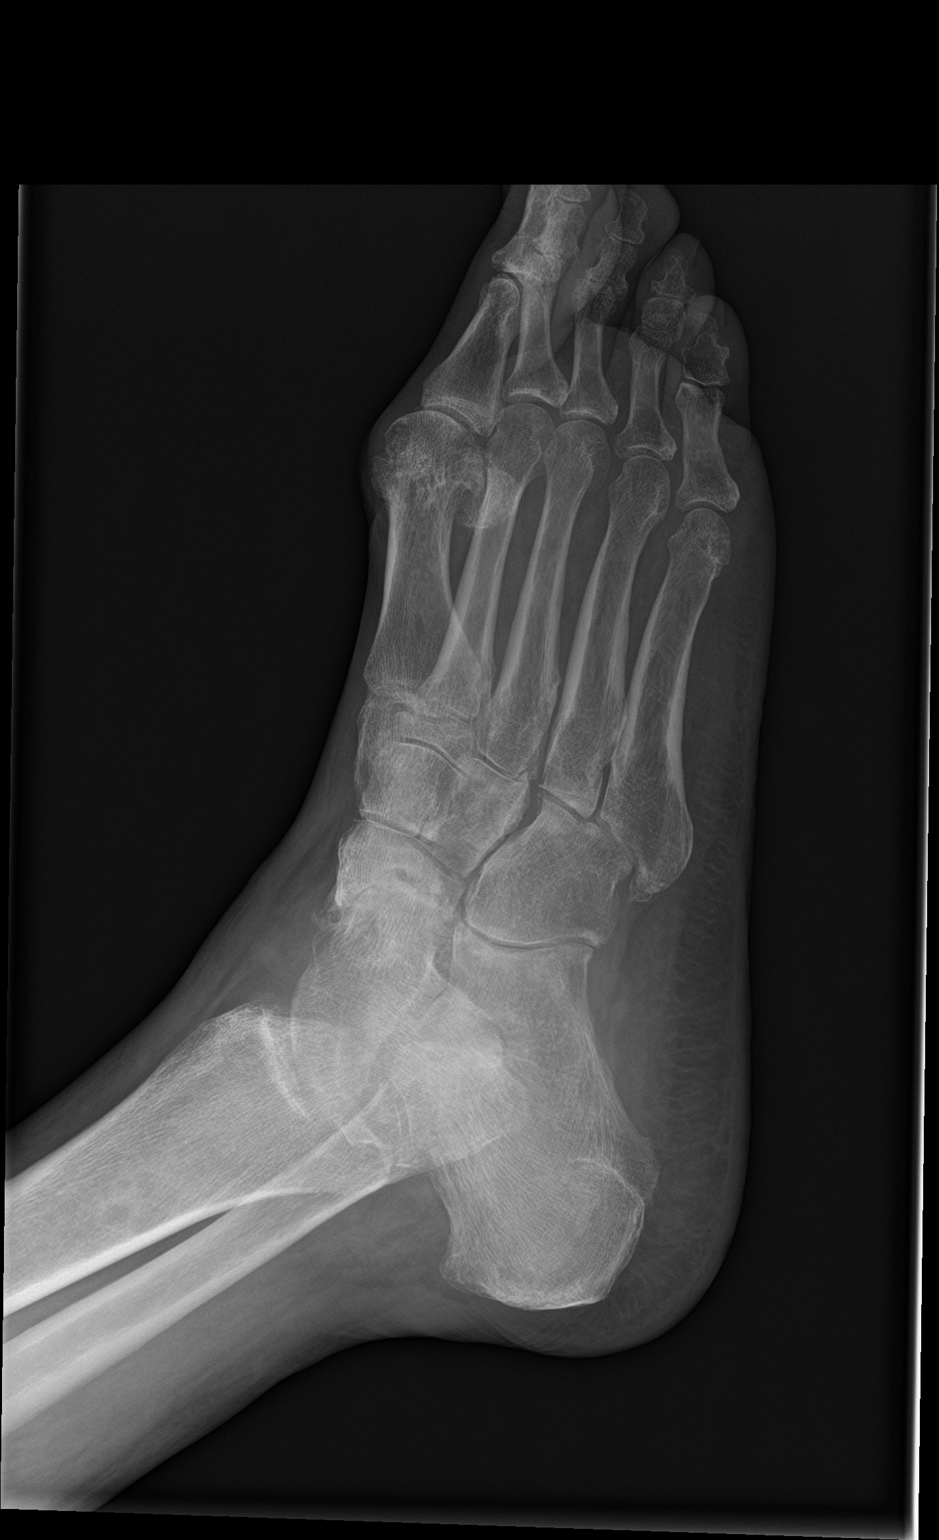

[foot lat]
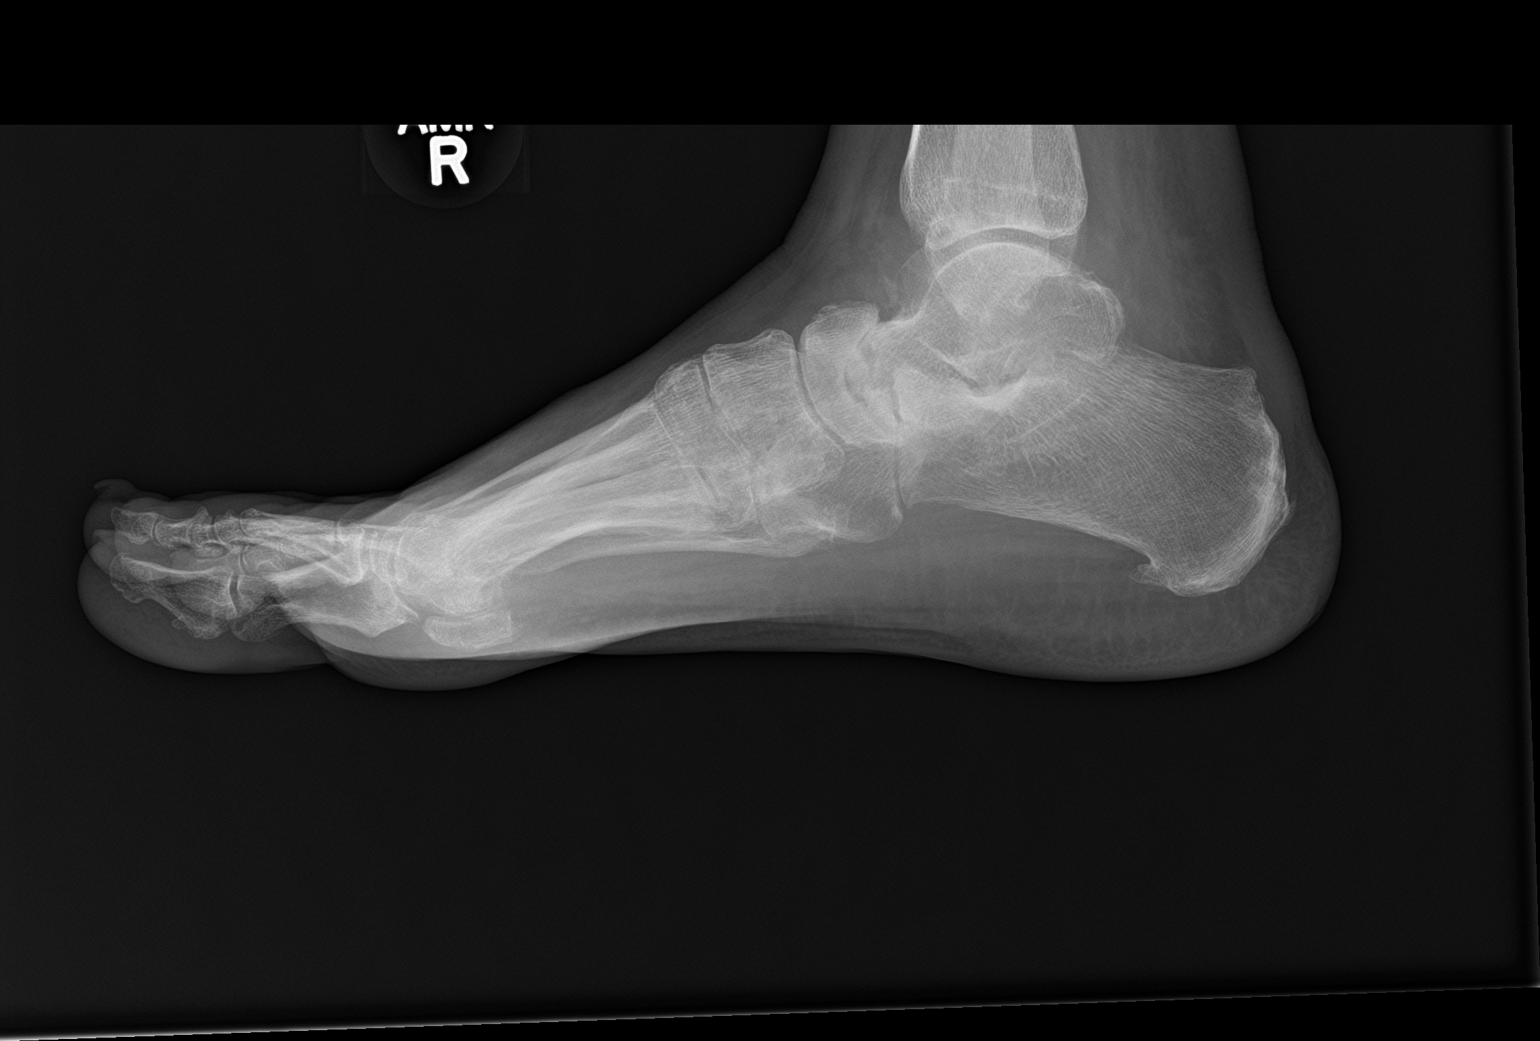

[3 of 3 positions shown; findings below may reference images not displayed]

FINDINGS: No fracture or dislocation is noted. Mild hallux valgus deformity of
the first metatarsophalangeal joint is noted. Severe degenerative
changes seen involving the talonavicular joint. Mild spurring of
posterior calcaneus is noted.
IMPRESSION: No acute abnormality seen in the left foot.

## 2017-03-13 IMAGING — DX DG ANKLE COMPLETE 3+V*R*
3 series · 3 of 3 positions shown · non-contrast
Comparison: September 18, 2014.

CLINICAL DATA: Right ankle pain and swelling after twisting injury
today. Initial encounter.

EXAM:
RIGHT ANKLE - COMPLETE 3+ VIEW

[ankle ap]
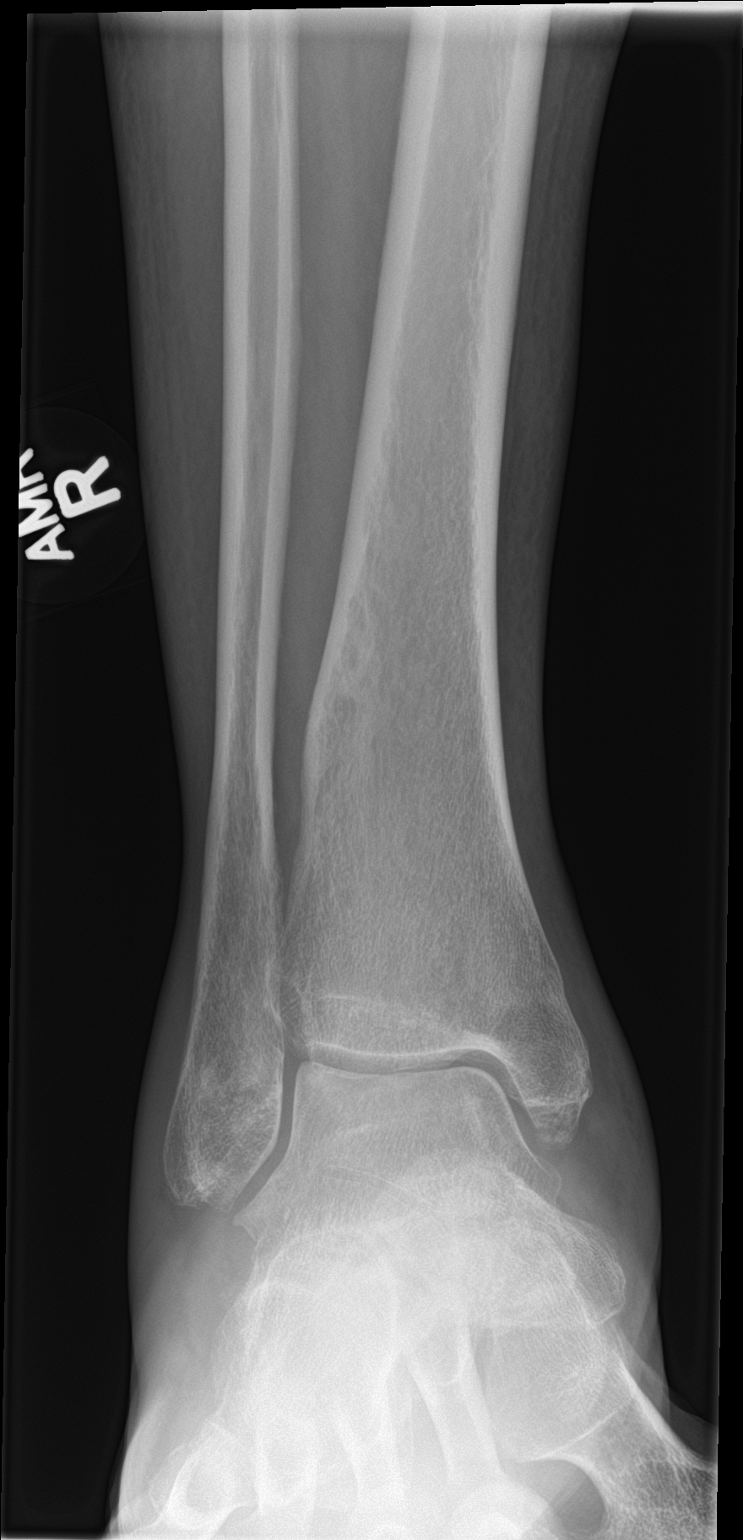

[ankle obl]
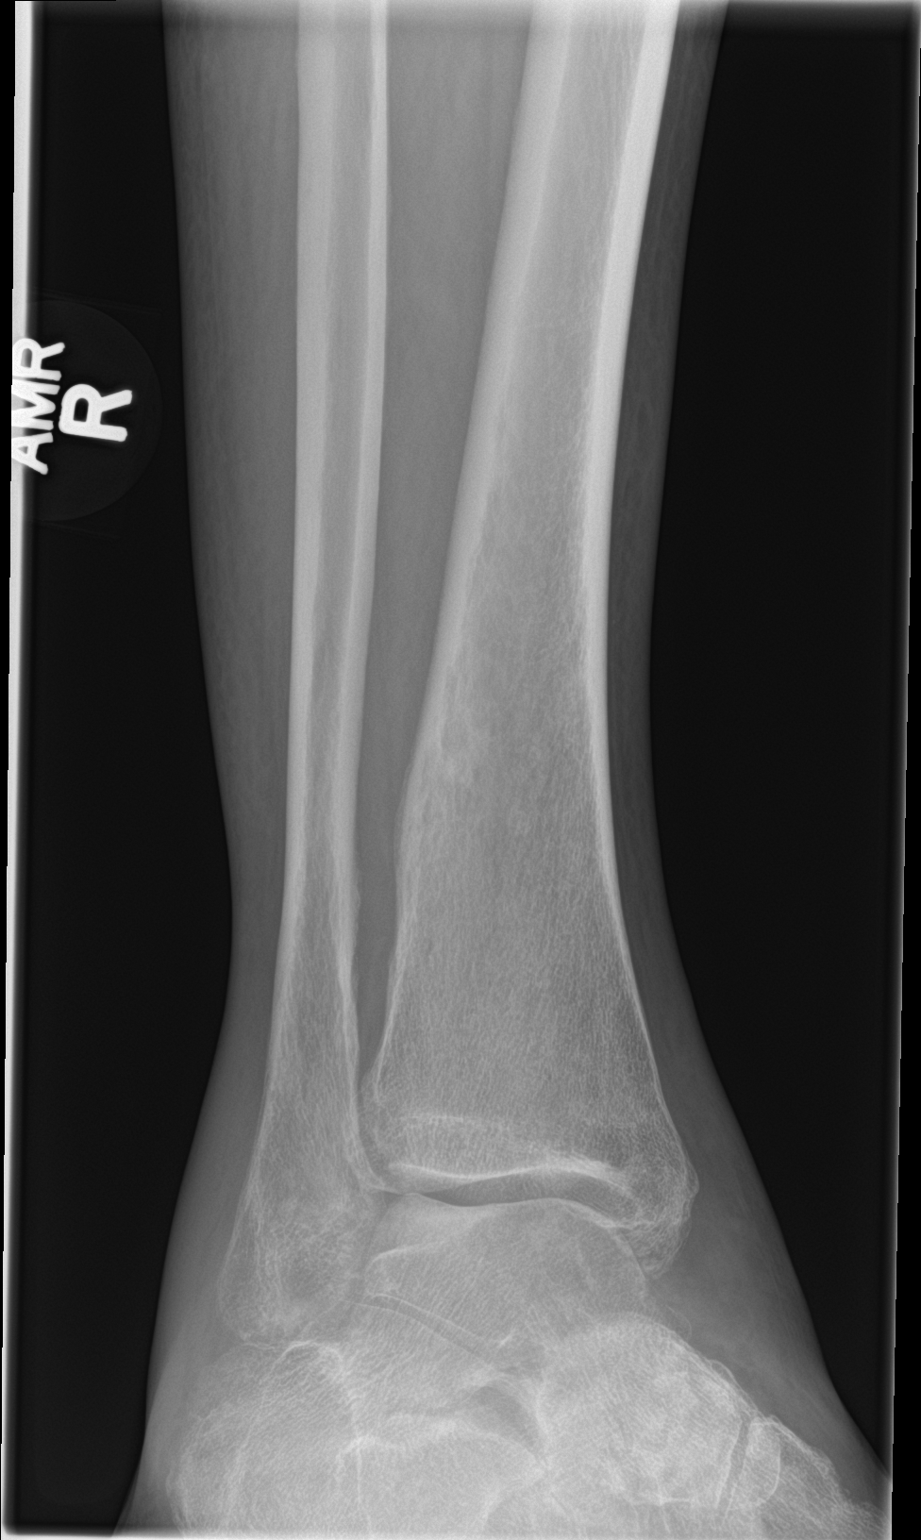

[ankle lat]
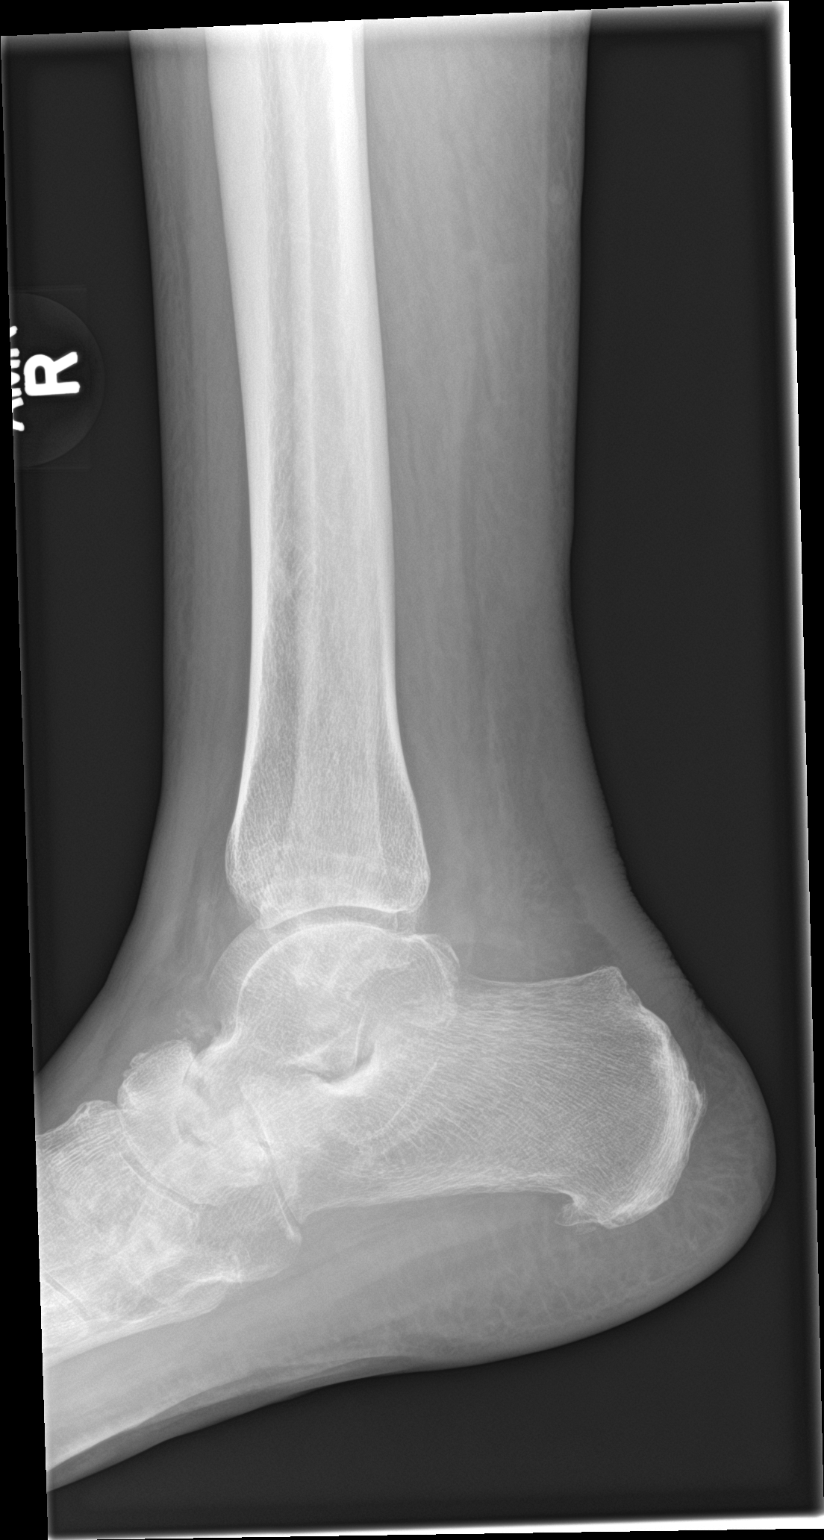

[3 of 3 positions shown; findings below may reference images not displayed]

FINDINGS: There is no evidence of fracture, dislocation, or joint effusion.
Degenerative changes are noted in the midfoot. Soft tissues are
unremarkable.
IMPRESSION: No acute abnormality seen in the right ankle.

## 2017-03-16 ENCOUNTER — Encounter (HOSPITAL_COMMUNITY): Payer: Self-pay | Admitting: Emergency Medicine

## 2017-03-16 ENCOUNTER — Emergency Department (HOSPITAL_COMMUNITY): Payer: Self-pay

## 2017-03-16 ENCOUNTER — Other Ambulatory Visit: Payer: Self-pay

## 2017-03-16 ENCOUNTER — Emergency Department (HOSPITAL_COMMUNITY)
Admission: EM | Admit: 2017-03-16 | Discharge: 2017-03-16 | Disposition: A | Discharge status: Home / Self Care | Payer: Self-pay | Attending: Emergency Medicine | Admitting: Emergency Medicine

## 2017-03-16 DIAGNOSIS — J449 Chronic obstructive pulmonary disease, unspecified: Secondary | ICD-10-CM | POA: Insufficient documentation

## 2017-03-16 DIAGNOSIS — Y9389 Activity, other specified: Secondary | ICD-10-CM | POA: Insufficient documentation

## 2017-03-16 DIAGNOSIS — Y998 Other external cause status: Secondary | ICD-10-CM | POA: Insufficient documentation

## 2017-03-16 DIAGNOSIS — Y92002 Bathroom of unspecified non-institutional (private) residence single-family (private) house as the place of occurrence of the external cause: Secondary | ICD-10-CM | POA: Insufficient documentation

## 2017-03-16 DIAGNOSIS — E0590 Thyrotoxicosis, unspecified without thyrotoxic crisis or storm: Secondary | ICD-10-CM | POA: Insufficient documentation

## 2017-03-16 DIAGNOSIS — Z79899 Other long term (current) drug therapy: Secondary | ICD-10-CM | POA: Insufficient documentation

## 2017-03-16 DIAGNOSIS — I10 Essential (primary) hypertension: Secondary | ICD-10-CM | POA: Insufficient documentation

## 2017-03-16 DIAGNOSIS — S93602A Unspecified sprain of left foot, initial encounter: Secondary | ICD-10-CM | POA: Insufficient documentation

## 2017-03-16 DIAGNOSIS — W208XXA Other cause of strike by thrown, projected or falling object, initial encounter: Secondary | ICD-10-CM | POA: Insufficient documentation

## 2017-03-16 DIAGNOSIS — F17210 Nicotine dependence, cigarettes, uncomplicated: Secondary | ICD-10-CM | POA: Insufficient documentation

## 2017-03-16 DIAGNOSIS — F419 Anxiety disorder, unspecified: Secondary | ICD-10-CM | POA: Insufficient documentation

## 2017-03-16 MED ORDER — KETOROLAC TROMETHAMINE 60 MG/2ML IM SOLN
60.0000 mg | Freq: Once | INTRAMUSCULAR | Status: AC
  Administered 2017-03-16: 60 mg via INTRAMUSCULAR
  Filled 2017-03-16: qty 2

## 2017-03-16 MED ORDER — ONDANSETRON 8 MG PO TBDP
8.0000 mg | ORAL_TABLET | Freq: Once | ORAL | Status: AC
  Administered 2017-03-16: 8 mg via ORAL
  Filled 2017-03-16: qty 1

## 2017-03-16 NOTE — ED Triage Notes (Signed)
Pt slipped while trying to get in the tub and injured left foot. Pain to left foot.

## 2017-03-16 NOTE — Discharge Instructions (Signed)
Elevate and apply ice packs on/off to your foot.  Wear the boot as needed for support.  Follow-up with Dr. Hilda Lias or Dr. Romeo Apple in one week if not improving

## 2017-03-17 NOTE — ED Provider Notes (Signed)
[facility] EMERGENCY DEPARTMENT Provider Note   CSN: [id number] Arrival date & time: 03/16/17  1359     History   Chief Complaint Chief Complaint  Patient presents with  . Foot Pain    HPI [name] is a 52 y.o. female.  HPI   [name] is a 52 y.o. female with history of RA, presents to the Emergency Department complaining of left foot pain and swelling for several hours.  States that two large bottles of shampoo fell onto her left foot while stepping into her bathtub.  Complains of throbbing pain to the lateral foot that is constant since onset and worse with weight bearing.  She has not applied ice or taken any over-the-counter pain relievers.  She denies pain to the ankle, toes, open wounds or bruising.  Past Medical History:  Diagnosis Date  . Arthritis, rheumatoid (HCC)   . Chronic anemia   . Chronic pain   . COPD (chronic obstructive pulmonary disease) (HCC)   . Depression   . Hypertension   . Incidental lung nodule   . Osteoporosis   . Rheumatoid arthritis(714.0)   . Trichimoniasis     Patient Active Problem List   Diagnosis Date Noted  . Trichomonas vaginitis 04/23/2016  . HSIL (high grade squamous intraepithelial lesion) on Pap smear of cervix 04/23/2016  . Hypokalemia 05/05/2015  . Absolute anemia 05/05/2015  . Anxiety disorder 05/05/2015  . Fracture, cuboid 03/05/2015  . Pathological fracture of pelvis 03/05/2015  . Pathological fracture of pelvis due to osteoporosis (HCC) 02/20/2015  . Osteoporosis 02/20/2015  . Chronic pain 02/04/2015  . Esophageal reflux 02/04/2015  . Subclinical hyperthyroidism 02/04/2015  . Gastritis 01/07/2015  . Rheumatoid arthritis (HCC) 11/19/2014  . Cigarette nicotine dependence, uncomplicated 11/14/2014  . Hyperlipidemia 11/14/2014    Past Surgical History:  Procedure Laterality Date  . BREAST CYST EXCISION  12/31/2010   Procedure: CYST EXCISION BREAST;  Surgeon: Fabio Bering, MD;  Location: [facility];   Service: General;  Laterality: Right;  Excision Sebaceous Cyst Right Breast  . ENDOMETRIAL ABLATION    . TUBAL LIGATION      OB History    Gravida Para Term Preterm AB Living   6 5 5   1 5    SAB TAB Ectopic Multiple Live Births   1               Home Medications    Prior to Admission medications   Medication Sig Start Date End Date Taking? Authorizing Provider  alendronate (FOSAMAX) 70 MG tablet Take 70 mg by mouth once a week. Take with a full glass of water on an empty stomach.(Monday)    [provider]  atorvastatin (LIPITOR) 20 MG tablet Take 1 tablet (20 mg total) by mouth daily. 02/18/16   04/17/16, PA-C  cyclobenzaprine (FLEXERIL) 10 MG tablet Take 1 tablet (10 mg total) by mouth 3 (three) times daily as needed. 12/16/16   Bryam Taborda, PA-C  folic acid (FOLVITE) 1 MG tablet Take 1 mg by mouth daily.    [provider]  HYDROcodone-acetaminophen (NORCO/VICODIN) 5-325 MG tablet Take one tab po q 4 hrs prn pain 12/16/16   Kalesha Irving, PA-C  IRON PO Take 1 tablet by mouth daily.    [provider]  methotrexate (RHEUMATREX) 2.5 MG tablet Take 20 mg by mouth once a week. Caution:Chemotherapy. Protect from light.(Thursday)    [provider]  metroNIDAZOLE (FLAGYL) 500 MG tablet Take 1 tablet (500  mg total) by mouth 2 (two) times daily. 04/23/16   Tilda Burrow, MD  Multiple Vitamin (MULTIVITAMIN WITH MINERALS) TABS Take 1 tablet by mouth daily. Reported on 05/02/2015    [provider]  naproxen (NAPROSYN) 500 MG tablet Take 500 mg by mouth 2 (two) times daily with a meal.    [provider]  omeprazole (PRILOSEC) 40 MG capsule Take 1 capsule (40 mg total) by mouth daily. 02/18/16   Jacquelin Hawking, PA-C  predniSONE (DELTASONE) 5 MG tablet Take 5 mg by mouth daily. 09/04/16   [provider]  predniSONE (STERAPRED UNI-PAK 21 TAB) 10 MG (21) TBPK tablet Take by mouth daily. Take 6 tabs by mouth daily  for 2  days, then 5 tabs for 2 days, then 4 tabs for 2 days, then 3 tabs for 2 days, 2 tabs for 2 days, then 1 tab by mouth daily for 2 days 02/17/17   Raeford Razor, MD    Family History Family History  Problem Relation Age of Onset  . Cancer Father        lung  . Hypertension Mother   . Hypertension Daughter   . Anesthesia problems Neg Hx     Social History Social History   Tobacco Use  . Smoking status: Current Every Day Smoker    Packs/day: 1.00    Years: 30.00    Pack years: 30.00    Types: Cigarettes  . Smokeless tobacco: Never Used  Substance Use Topics  . Alcohol use: No    Alcohol/week: 0.0 oz  . Drug use: No     Allergies   Voltaren [diclofenac sodium] and Codeine   Review of Systems Review of Systems  Constitutional: Negative for chills and fever.  Genitourinary: Negative for dysuria.  Musculoskeletal: Positive for arthralgias (Left foot pain) and joint swelling.  Skin: Negative for color change and wound.  Neurological: Negative for weakness and numbness.  All other systems reviewed and are negative.    Physical Exam Updated Vital Signs BP (!) 122/91 (BP Location: Right Arm)   Pulse 84   Temp 98.3 F (36.8 C) (Oral)   Resp 18   Ht 5\' 5"  (1.651 m)   Wt 68 kg (150 lb)   SpO2 100%   BMI 24.96 kg/m   Physical Exam  Constitutional: She is oriented to person, place, and time. She appears well-developed and well-nourished. No distress.  HENT:  Head: Normocephalic and atraumatic.  Cardiovascular: Normal rate, regular rhythm and intact distal pulses.  DP and PT pulses are palpable bilaterally  Pulmonary/Chest: Effort normal and breath sounds normal.  Musculoskeletal: She exhibits tenderness. She exhibits no edema or deformity.  Tenderness to palpation of the lateral left foot.  No edema, ecchymosis, or skin changes.  No proximal tenderness or edema.    Neurological: She is alert and oriented to person, place, and time. She exhibits normal muscle tone.  Coordination normal.  Skin: Skin is warm and dry. Capillary refill takes less than 2 seconds.  Nursing note and vitals reviewed.    ED Treatments / Results  Labs (all labs ordered are listed, but only abnormal results are displayed) Labs Reviewed - No data to display  EKG  EKG Interpretation None       Radiology Dg Foot Complete Left  Result Date: 03/16/2017 CLINICAL DATA:  52 year old female status post slip and fall in bathroom with left foot injury and pain. EXAM: LEFT FOOT - COMPLETE 3+ VIEW COMPARISON:  Left foot series 05/08/2015.  FINDINGS: Chronic degenerative appearing subchondral sclerosis in the tarsal bones and left 1st MTP. Superimposed marginal chronic erosion along the medial 1st MTP. Mildly increased tarsal bone spurring since 2017. The calcaneus appears intact. No tarsal bone fracture identified. Stable metatarsal alignment. No metatarsal fracture identified. No phalanx fracture or dislocation identified. IMPRESSION: 1.  No acute fracture or dislocation identified about the left foot. 2. Advanced for age arthropathic changes in the left foot, including periarticular erosion at the 1st MTP, with mild progression since 2017. Electronically Signed   By: Odessa Fleming M.D.   On: 03/16/2017 14:29    Procedures Procedures (including critical care time)  Medications Ordered in ED Medications  ketorolac (TORADOL) injection 60 mg (60 mg Intramuscular Given 03/16/17 1844)  ondansetron (ZOFRAN-ODT) disintegrating tablet 8 mg (8 mg Oral Given 03/16/17 1844)     Initial Impression / Assessment and Plan / ED Course  I have reviewed the triage vital signs and the nursing notes.  Pertinent labs & imaging results that were available during my care of the patient were reviewed by me and considered in my medical decision making (see chart for details).     X-ray negative for fracture.  Neurovascularly intact.  Likely sprain. Postop shoe applied for comfort.  Patient is requesting  steroids, I do not feel that this is appropriate given that symptoms are likely related to acute sprain.  I have explained this to the patient.  She verbalizes understanding.  Symptoms treated with IM Toradol.  She agrees to elevate ice and orthopedic follow-up in 1 week if not improving.  Final Clinical Impressions(s) / ED Diagnoses   Final diagnoses:  Sprain of left foot, initial encounter    ED Discharge Orders    None       Pauline Aus, PA-C 03/17/17 0029    Bethann Berkshire, MD 03/18/17 1545

## 2017-03-20 IMAGING — DX DG HIP (WITH OR WITHOUT PELVIS) 2-3V*R*
3 series · 3 of 3 positions shown · non-contrast
Comparison: None.

CLINICAL DATA: 49-year-old who fell while leaving her doctor's
office earlier today, injuring the right hip, pain in the right
groin area.

EXAM:
DG HIP (WITH OR WITHOUT PELVIS) 2-3V RIGHT

[pelvis ap]
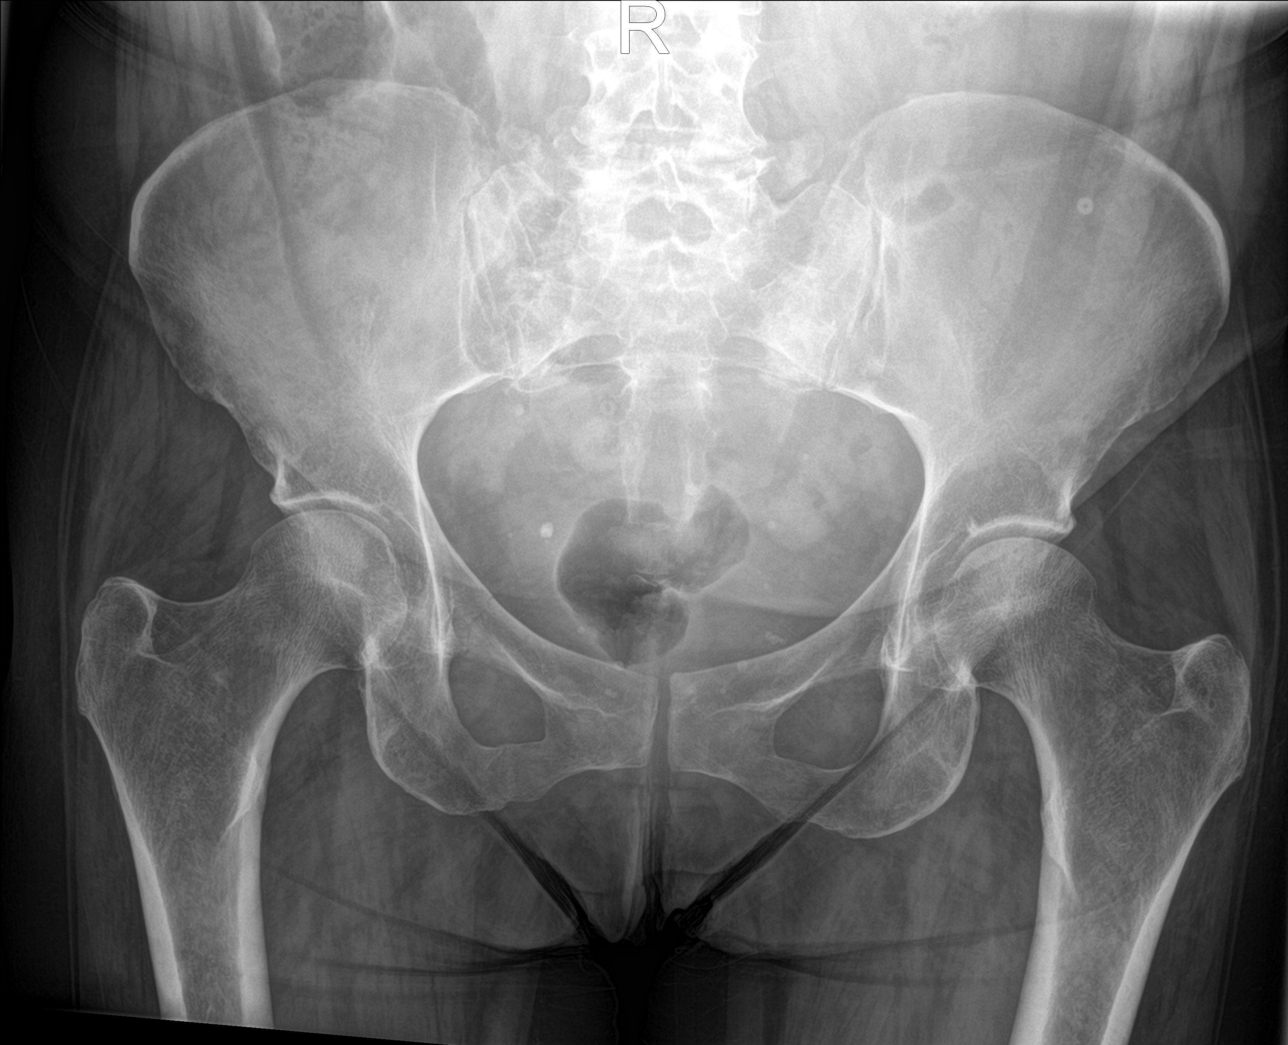

[hip ap]
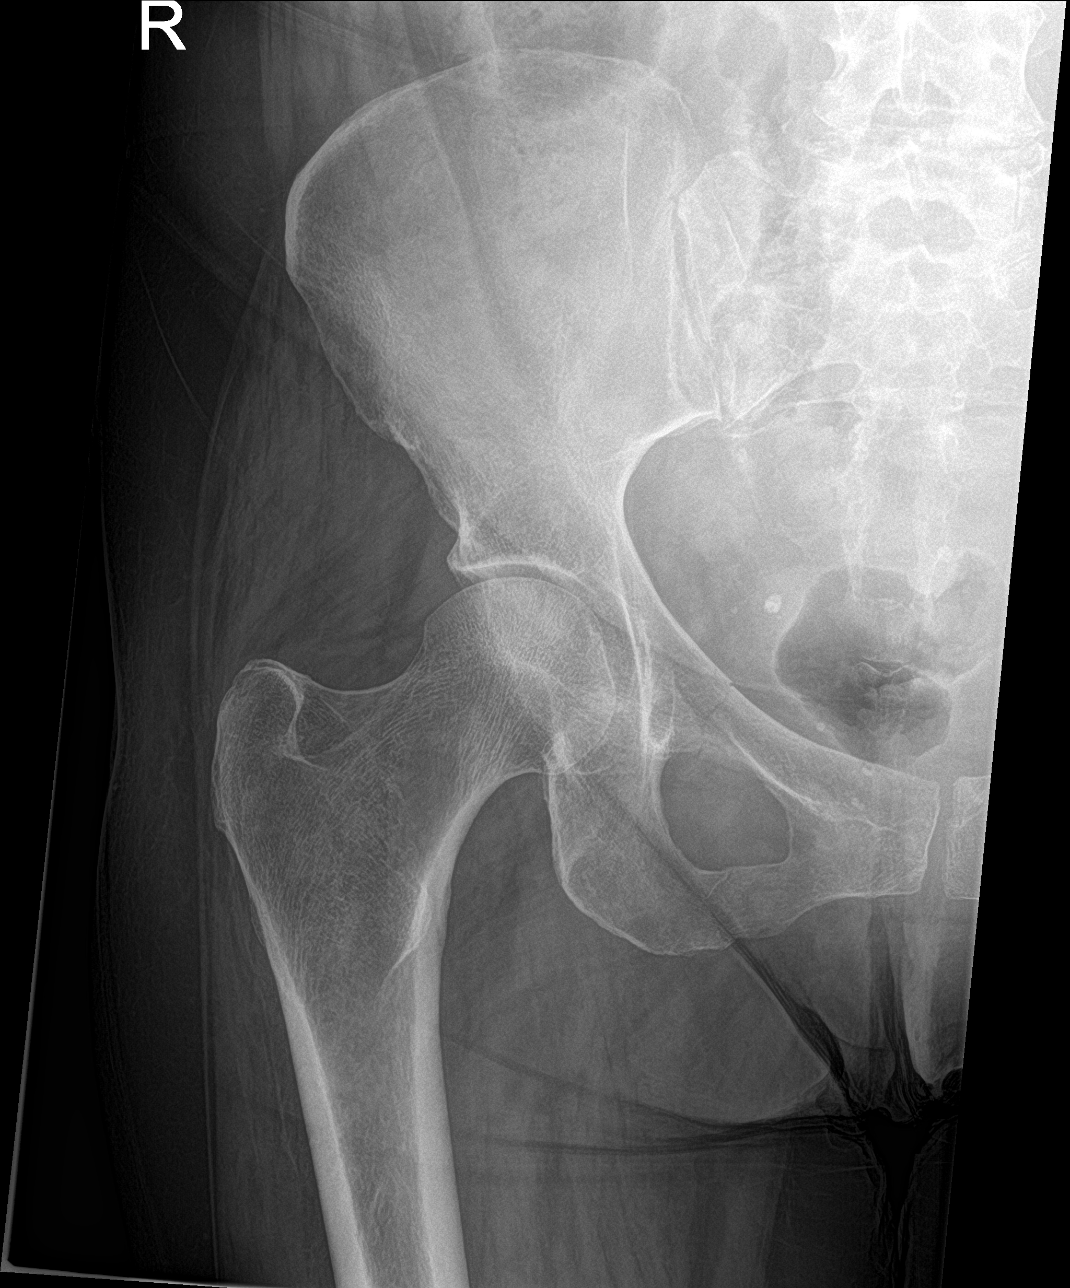

[hip lat]
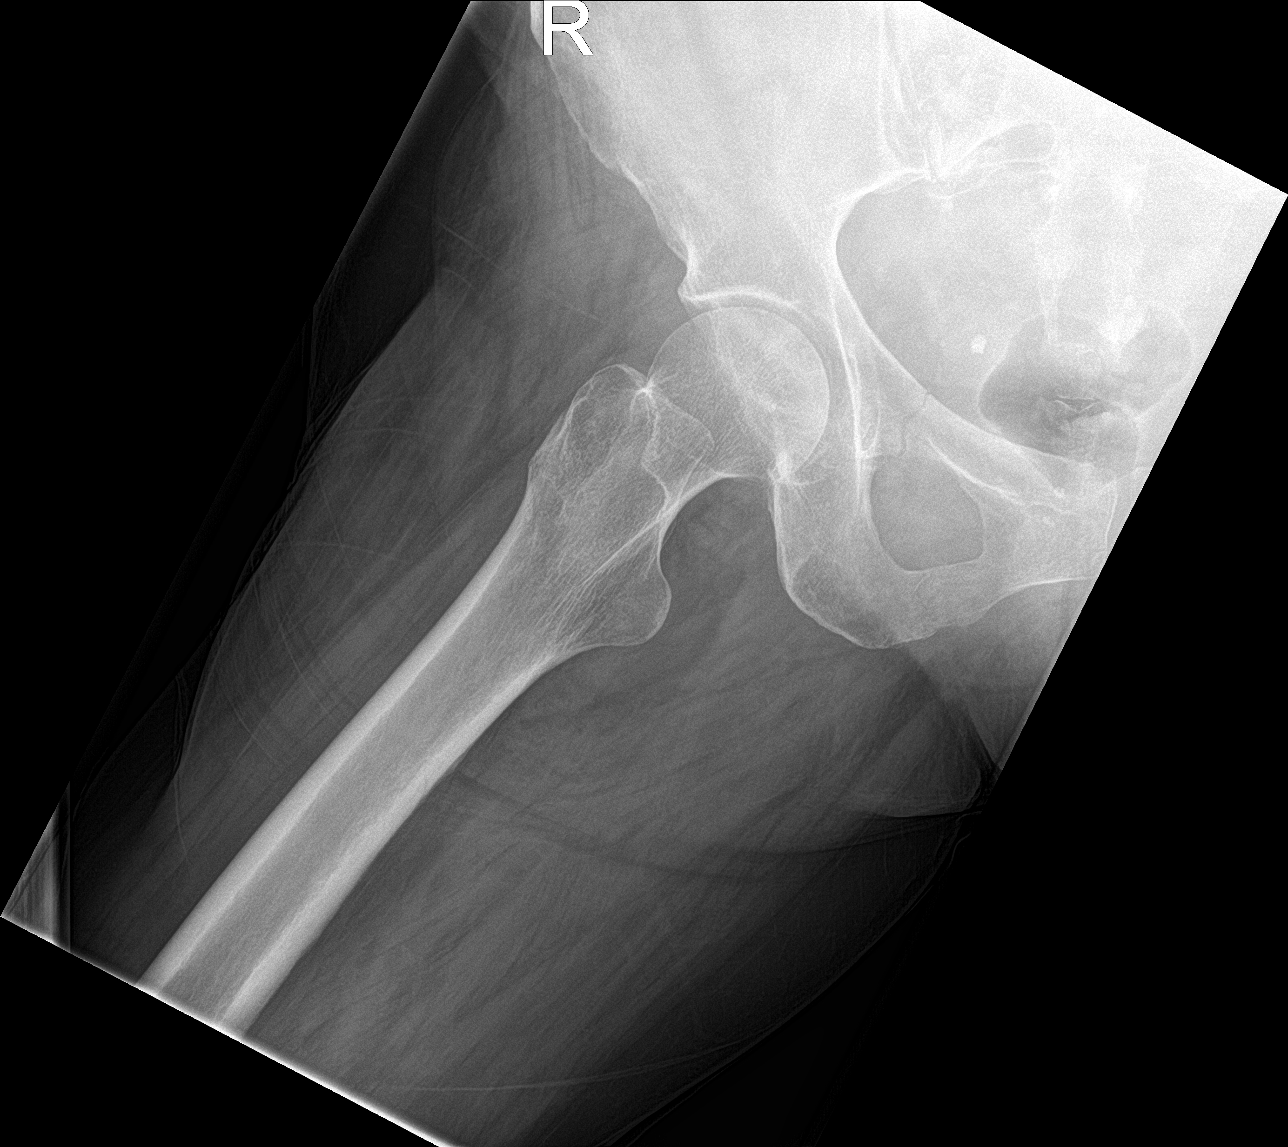

[3 of 3 positions shown; findings below may reference images not displayed]

FINDINGS: Nondisplaced fractures involving the right superior and inferior
pubic rami. No fractures involving the proximal femur. No
significant joint space narrowing in the right hip.

Symmetric normal-appearing contralateral left hip joint. Sacroiliac
joints and symphysis pubis intact. Mild degenerative changes
involving the visualized lower lumbar spine.
IMPRESSION: Nondisplaced fractures involving the right superior and inferior
pubic rami.

## 2017-04-12 ENCOUNTER — Emergency Department (HOSPITAL_COMMUNITY)
Admission: EM | Admit: 2017-04-12 | Discharge: 2017-04-12 | Disposition: A | Discharge status: Home / Self Care | Payer: Self-pay | Attending: Emergency Medicine | Admitting: Emergency Medicine

## 2017-04-12 ENCOUNTER — Emergency Department (HOSPITAL_COMMUNITY): Payer: Self-pay

## 2017-04-12 ENCOUNTER — Encounter (HOSPITAL_COMMUNITY): Payer: Self-pay | Admitting: Emergency Medicine

## 2017-04-12 ENCOUNTER — Other Ambulatory Visit: Payer: Self-pay

## 2017-04-12 DIAGNOSIS — Z79899 Other long term (current) drug therapy: Secondary | ICD-10-CM | POA: Insufficient documentation

## 2017-04-12 DIAGNOSIS — I10 Essential (primary) hypertension: Secondary | ICD-10-CM | POA: Insufficient documentation

## 2017-04-12 DIAGNOSIS — M069 Rheumatoid arthritis, unspecified: Secondary | ICD-10-CM | POA: Insufficient documentation

## 2017-04-12 DIAGNOSIS — M7022 Olecranon bursitis, left elbow: Secondary | ICD-10-CM | POA: Insufficient documentation

## 2017-04-12 DIAGNOSIS — J449 Chronic obstructive pulmonary disease, unspecified: Secondary | ICD-10-CM | POA: Insufficient documentation

## 2017-04-12 DIAGNOSIS — Y939 Activity, unspecified: Secondary | ICD-10-CM | POA: Insufficient documentation

## 2017-04-12 DIAGNOSIS — F17210 Nicotine dependence, cigarettes, uncomplicated: Secondary | ICD-10-CM | POA: Insufficient documentation

## 2017-04-12 MED ORDER — PREDNISONE 10 MG PO TABS: ORAL_TABLET | ORAL | 0 refills | Status: DC

## 2017-04-12 MED ORDER — DEXAMETHASONE SODIUM PHOSPHATE 10 MG/ML IJ SOLN
10.0000 mg | Freq: Once | INTRAMUSCULAR | Status: AC
  Administered 2017-04-12: 10 mg via INTRAMUSCULAR
  Filled 2017-04-12: qty 1

## 2017-04-12 MED ORDER — KETOROLAC TROMETHAMINE 30 MG/ML IJ SOLN
30.0000 mg | Freq: Once | INTRAMUSCULAR | Status: AC
  Administered 2017-04-12: 30 mg via INTRAMUSCULAR
  Filled 2017-04-12: qty 1

## 2017-04-12 NOTE — ED Provider Notes (Signed)
[facility] EMERGENCY DEPARTMENT Provider Note   CSN: [id number] Arrival date & time: 04/12/17  1609     History   Chief Complaint Chief Complaint  Patient presents with  . Joint Pain    HPI [name] is a 52 y.o. female with a history of chronic orthopedic pain and inflammation secondary to advanced rheumatoid arthritis presenting with a suspected rheumatoid flare x the past 3 days.  She specifically reports pain in her bilateral hand and wrists, hips and knees and lower back.  She is under the care of a rheumatologist at [facility]. Attempted to see them but was referred to the ed as thee were no appts available. She is currently getting Symponi monthly injections but she is doubting these being helpful.  She reports prednisone taper is the best medicine for her acute flares.  She denies fevers, chills, n/v or other complaints.  Also has complaint of a soft swelling at the left elbow.  She has chronic nodules in the left elbow from her rheumatoid disease, but reports a new swelling. She hit her elbow on a doorframe prior to the swelling.  The history is provided by the patient.    Past Medical History:  Diagnosis Date  . Arthritis, rheumatoid (HCC)   . Chronic anemia   . Chronic pain   . COPD (chronic obstructive pulmonary disease) (HCC)   . Depression   . Hypertension   . Incidental lung nodule   . Osteoporosis   . Rheumatoid arthritis(714.0)   . Trichimoniasis     Patient Active Problem List   Diagnosis Date Noted  . Trichomonas vaginitis 04/23/2016  . HSIL (high grade squamous intraepithelial lesion) on Pap smear of cervix 04/23/2016  . Hypokalemia 05/05/2015  . Absolute anemia 05/05/2015  . Anxiety disorder 05/05/2015  . Fracture, cuboid 03/05/2015  . Pathological fracture of pelvis 03/05/2015  . Pathological fracture of pelvis due to osteoporosis (HCC) 02/20/2015  . Osteoporosis 02/20/2015  . Chronic pain 02/04/2015  . Esophageal reflux 02/04/2015  .  Subclinical hyperthyroidism 02/04/2015  . Gastritis 01/07/2015  . Rheumatoid arthritis (HCC) 11/19/2014  . Cigarette nicotine dependence, uncomplicated 11/14/2014  . Hyperlipidemia 11/14/2014    Past Surgical History:  Procedure Laterality Date  . BREAST CYST EXCISION  12/31/2010   Procedure: CYST EXCISION BREAST;  Surgeon: Fabio Bering, MD;  Location: [facility];  Service: General;  Laterality: Right;  Excision Sebaceous Cyst Right Breast  . ENDOMETRIAL ABLATION    . TUBAL LIGATION       OB History    Gravida  6   Para  5   Term  5   Preterm      AB  1   Living  5     SAB  1   TAB      Ectopic      Multiple      Live Births               Home Medications    Prior to Admission medications   Medication Sig Start Date End Date Taking? Authorizing Provider  alendronate (FOSAMAX) 70 MG tablet Take 70 mg by mouth once a week. Take with a full glass of water on an empty stomach.(Monday)    [provider]  atorvastatin (LIPITOR) 20 MG tablet Take 1 tablet (20 mg total) by mouth daily. 02/18/16   Jacquelin Hawking, PA-C  cyclobenzaprine (FLEXERIL) 10 MG tablet Take 1 tablet (10 mg total) by mouth 3 (three) times daily  as needed. 12/16/16   Triplett, Tammy, PA-C  folic acid (FOLVITE) 1 MG tablet Take 1 mg by mouth daily.    [provider]  HYDROcodone-acetaminophen (NORCO/VICODIN) 5-325 MG tablet Take one tab po q 4 hrs prn pain 12/16/16   Triplett, Tammy, PA-C  IRON PO Take 1 tablet by mouth daily.    [provider]  methotrexate (RHEUMATREX) 2.5 MG tablet Take 20 mg by mouth once a week. Caution:Chemotherapy. Protect from light.(Thursday)    [provider]  metroNIDAZOLE (FLAGYL) 500 MG tablet Take 1 tablet (500 mg total) by mouth 2 (two) times daily. 04/23/16   Tilda Burrow, MD  Multiple Vitamin (MULTIVITAMIN WITH MINERALS) TABS Take 1 tablet by mouth daily. Reported on 05/02/2015    [provider]  naproxen (NAPROSYN)  500 MG tablet Take 500 mg by mouth 2 (two) times daily with a meal.    [provider]  omeprazole (PRILOSEC) 40 MG capsule Take 1 capsule (40 mg total) by mouth daily. 02/18/16   Jacquelin Hawking, PA-C  predniSONE (DELTASONE) 10 MG tablet Take 6 tablets day one, 5 tablets day two, 4 tablets day three, 3 tablets day four, 2 tablets day five, then 1 tablet day six 04/12/17   Burgess Amor, PA-C    Family History Family History  Problem Relation Age of Onset  . Cancer Father        lung  . Hypertension Mother   . Hypertension Daughter   . Anesthesia problems Neg Hx     Social History Social History   Tobacco Use  . Smoking status: Current Every Day Smoker    Packs/day: 1.00    Years: 30.00    Pack years: 30.00    Types: Cigarettes  . Smokeless tobacco: Never Used  Substance Use Topics  . Alcohol use: No    Alcohol/week: 0.0 oz  . Drug use: No     Allergies   Voltaren [diclofenac sodium] and Codeine   Review of Systems Review of Systems  Constitutional: Negative for fever.  Musculoskeletal: Positive for arthralgias. Negative for joint swelling and myalgias.  Neurological: Negative for weakness and numbness.     Physical Exam Updated Vital Signs BP 115/81   Pulse 99   Temp 98.7 F (37.1 C) (Oral)   Resp 18   SpO2 99%   Physical Exam  Constitutional: She appears well-developed and well-nourished.  HENT:  Head: Atraumatic.  Neck: Normal range of motion.  Cardiovascular:  Pulses equal bilaterally  Musculoskeletal: She exhibits tenderness.       Left elbow: She exhibits swelling. Tenderness found. Olecranon process tenderness noted.  Chronic changes c/w rheumatoid arthritis most noticeable in the hands and feet. Severe bunion deformity of great toes.  Ulner deviation of fingers with some evidence of early boutonnieres deformity.  Soft edema of posterior left olecranon c/w bursitis. No erythema or increased warmth.  Several small nodules palpable at bilateral  elbows and upper forearms.  Neurological: She is alert. She has normal strength. She displays normal reflexes. No sensory deficit.  Skin: Skin is warm and dry.  Psychiatric: She has a normal mood and affect.     ED Treatments / Results  Labs (all labs ordered are listed, but only abnormal results are displayed) Labs Reviewed - No data to display  EKG None  Radiology Dg Elbow Complete Left  Result Date: 04/12/2017 CLINICAL DATA:  Fall, left elbow pain/swelling, rheumatoid arthritis EXAM: LEFT ELBOW - COMPLETE 3+ VIEW COMPARISON:  07/24/2014 FINDINGS:  No fracture or dislocation is seen. The joint spaces are preserved. No displaced elbow joint fat pads suggest an elbow joint effusion. Soft tissue swelling/hematoma overlying the olecranon. IMPRESSION: Soft tissue swelling/hematoma overlying the olecranon. No fracture or dislocation is seen. Electronically Signed   By: Charline Bills M.D.   On: 04/12/2017 17:51    Procedures Procedures (including critical care time)  Medications Ordered in ED Medications  dexamethasone (DECADRON) injection 10 mg (10 mg Intramuscular Given 04/12/17 1813)  ketorolac (TORADOL) 30 MG/ML injection 30 mg (30 mg Intramuscular Given 04/12/17 1813)     Initial Impression / Assessment and Plan / ED Course  I have reviewed the triage vital signs and the nursing notes.  Pertinent labs & imaging results that were available during my care of the patient were reviewed by me and considered in my medical decision making (see chart for details).     Pt with chronic findings of rheumatoid with a non infected appearing left olecranon bursitis. She was placed on a prednisone taper, discussed heat tx to elbow, avoid pressure. Prn f/u with rheumatology.    The patient appears reasonably screened and/or stabilized for discharge and I doubt any other medical condition or other [facility] requiring further screening, evaluation, or treatment in the ED at this time prior to  discharge.   Final Clinical Impressions(s) / ED Diagnoses   Final diagnoses:  Rheumatoid arthritis flare (HCC)  Olecranon bursitis of left elbow    ED Discharge Orders        Ordered    predniSONE (DELTASONE) 10 MG tablet     04/12/17 1843       Burgess Amor, PA-C 04/14/17 1423    Rolland Porter, MD 04/18/17 [phone]

## 2017-04-12 NOTE — ED Triage Notes (Signed)
Pt states she is having flare up of her rheumatoid arthiritis to every joint x 3 days. Nad. Pt put in registration for dizzines but pt denies any dizziness.

## 2017-04-12 NOTE — Discharge Instructions (Addendum)
Take your next dose of the prednisone tomorrow as you received a steroid shot here today.

## 2017-04-25 IMAGING — CT CT ANGIO CHEST
1 of 6 series · 5 of 36 positions shown · IV contrast (Omnipaque 300)
Comparison: CTA of the chest performed 05/23/2014, and chest
radiograph performed 12/23/2014

CLINICAL DATA: Acute onset of mid upper back pain. Bilateral leg
swelling and shortness of breath. Initial encounter.

EXAM:
CT ANGIOGRAPHY CHEST WITH CONTRAST
TECHNIQUE: Multidetector CT imaging of the chest was performed using the
standard protocol during bolus administration of intravenous
contrast. Multiplanar CT image reconstructions and MIPs were
obtained to evaluate the vascular anatomy.
CONTRAST:  100mL OMNIPAQUE IOHEXOL 350 MG/ML SOLN

[Series 4: pe 3.0 b40f · axial · 0.66mm/px · z∈[-244,-64]mm · 5 of 91 slices shown]
[im 16/91  lung]
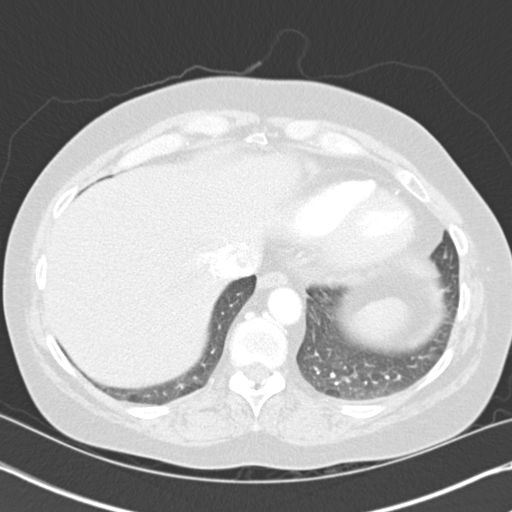
[im 31/91  mediastinal]
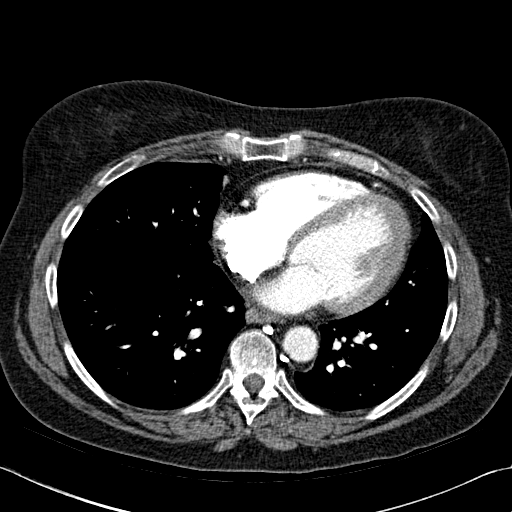
[im 46/91  lung]
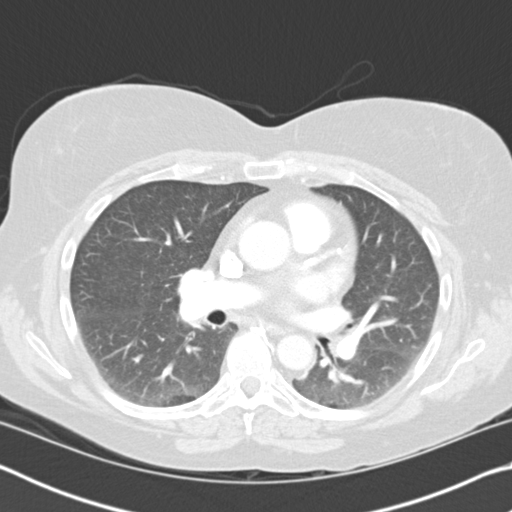
[im 61/91  mediastinal]
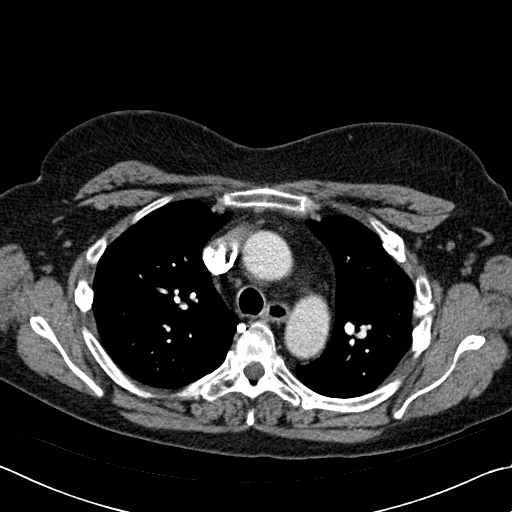
[im 76/91  lung]
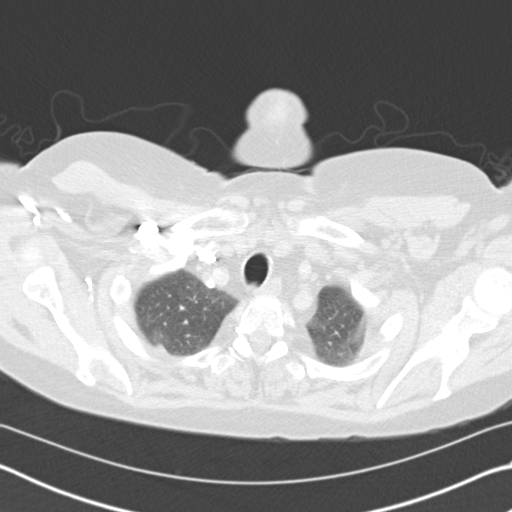

[5 of 36 positions shown; findings below may reference images not displayed]

FINDINGS: There is no evidence of pulmonary embolus.

Minimal bibasilar atelectasis is noted. The lungs are otherwise
grossly clear. There is no evidence of significant focal
consolidation, pleural effusion or pneumothorax. No masses are
identified; no abnormal focal contrast enhancement is seen.

Diffuse coronary artery calcifications are seen. The mediastinum is
otherwise unremarkable. No mediastinal lymphadenopathy is seen. No
pericardial effusion is identified. The great vessels are grossly
unremarkable in appearance. No axillary lymphadenopathy is seen. The
thyroid gland is unremarkable in appearance.

The visualized portions of the liver and spleen are unremarkable.

No acute osseous abnormalities are seen.

Review of the MIP images confirms the above findings.
IMPRESSION: 1. No evidence of pulmonary embolus.
2. Minimal bibasilar atelectasis noted.  Lungs otherwise clear.
3. Diffuse coronary artery calcifications seen.

## 2017-05-10 ENCOUNTER — Emergency Department (HOSPITAL_COMMUNITY)
Admission: EM | Admit: 2017-05-10 | Discharge: 2017-05-10 | Disposition: A | Discharge status: Home / Self Care | Payer: Self-pay | Attending: Emergency Medicine | Admitting: Emergency Medicine

## 2017-05-10 ENCOUNTER — Emergency Department (HOSPITAL_COMMUNITY): Payer: Self-pay

## 2017-05-10 ENCOUNTER — Encounter (HOSPITAL_COMMUNITY): Payer: Self-pay

## 2017-05-10 DIAGNOSIS — Z79899 Other long term (current) drug therapy: Secondary | ICD-10-CM | POA: Insufficient documentation

## 2017-05-10 DIAGNOSIS — J449 Chronic obstructive pulmonary disease, unspecified: Secondary | ICD-10-CM | POA: Insufficient documentation

## 2017-05-10 DIAGNOSIS — W19XXXA Unspecified fall, initial encounter: Secondary | ICD-10-CM

## 2017-05-10 DIAGNOSIS — F17210 Nicotine dependence, cigarettes, uncomplicated: Secondary | ICD-10-CM | POA: Insufficient documentation

## 2017-05-10 DIAGNOSIS — I10 Essential (primary) hypertension: Secondary | ICD-10-CM | POA: Insufficient documentation

## 2017-05-10 DIAGNOSIS — M25461 Effusion, right knee: Secondary | ICD-10-CM | POA: Insufficient documentation

## 2017-05-10 MED ORDER — DEXAMETHASONE SODIUM PHOSPHATE 10 MG/ML IJ SOLN
10.0000 mg | Freq: Once | INTRAMUSCULAR | Status: AC
  Administered 2017-05-10: 10 mg via INTRAMUSCULAR
  Filled 2017-05-10: qty 1

## 2017-05-10 MED ORDER — ONDANSETRON HCL 4 MG PO TABS
4.0000 mg | ORAL_TABLET | Freq: Once | ORAL | Status: AC
  Administered 2017-05-10: 4 mg via ORAL
  Filled 2017-05-10: qty 1

## 2017-05-10 MED ORDER — KETOROLAC TROMETHAMINE 60 MG/2ML IM SOLN
60.0000 mg | Freq: Once | INTRAMUSCULAR | Status: AC
  Administered 2017-05-10: 60 mg via INTRAMUSCULAR
  Filled 2017-05-10: qty 2

## 2017-05-10 NOTE — Discharge Instructions (Addendum)
The x-ray of your knee is negative for fracture or dislocation.  The examination of your shoulder and rib area is negative for acute fracture or dislocation.  Please continue your current medications.  Please follow-up with your primary physician or your rheumatology specialist.  I suspect that some of the swelling and tenderness is result of an inflammatory attack following your fall, and aggravation of your arthritis.  You were treated in the emergency department with intramuscular Toradol as well as intramuscular Decadron.

## 2017-05-10 NOTE — ED Triage Notes (Addendum)
Pt reports she fell in the bathtub last night. States she straddled the tub. Pain is worse behind right knee and bilateral buttocks. Pt reports that it hurts in her right shoulder

## 2017-05-10 NOTE — ED Provider Notes (Signed)
[facility] EMERGENCY DEPARTMENT Provider Note   CSN: [id number] Arrival date & time: 05/10/17  1447     History   Chief Complaint Chief Complaint  Patient presents with  . Fall    HPI AZALINE MOUCH is a 52 y.o. female.  Patient is a 52 year old female who presents to the emergency department following a fall in the bathtub.  Patient states that on last night she was straddled the bathtub, getting into the tub when she slipped and fell and injured her right knee her buttocks and her right shoulder.  The patient has a history of rheumatoid arthritis, chronic pain.  There was no loss of consciousness.  No difficulty with breathing.  No hemoptysis reported since the fall.  Patient denies being on any anticoagulation medications.  She has not had any recent operations or procedures involving her knee, or her shoulder area.  She presents now for assistance with the pain.  It is also of note that the patient is being seen by a rheumatologist in [facility] for her arthritis changes.     Past Medical History:  Diagnosis Date  . Arthritis, rheumatoid (HCC)   . Chronic anemia   . Chronic pain   . COPD (chronic obstructive pulmonary disease) (HCC)   . Depression   . Hypertension   . Incidental lung nodule   . Osteoporosis   . Rheumatoid arthritis(714.0)   . Trichimoniasis     Patient Active Problem List   Diagnosis Date Noted  . Trichomonas vaginitis 04/23/2016  . HSIL (high grade squamous intraepithelial lesion) on Pap smear of cervix 04/23/2016  . Hypokalemia 05/05/2015  . Absolute anemia 05/05/2015  . Anxiety disorder 05/05/2015  . Fracture, cuboid 03/05/2015  . Pathological fracture of pelvis 03/05/2015  . Pathological fracture of pelvis due to osteoporosis (HCC) 02/20/2015  . Osteoporosis 02/20/2015  . Chronic pain 02/04/2015  . Esophageal reflux 02/04/2015  . Subclinical hyperthyroidism 02/04/2015  . Gastritis 01/07/2015  . Rheumatoid arthritis  (HCC) 11/19/2014  . Cigarette nicotine dependence, uncomplicated 11/14/2014  . Hyperlipidemia 11/14/2014    Past Surgical History:  Procedure Laterality Date  . BREAST CYST EXCISION  12/31/2010   Procedure: CYST EXCISION BREAST;  Surgeon: Fabio Bering, MD;  Location: [facility];  Service: General;  Laterality: Right;  Excision Sebaceous Cyst Right Breast  . ENDOMETRIAL ABLATION    . TUBAL LIGATION       OB History    Gravida  6   Para  5   Term  5   Preterm      AB  1   Living  5     SAB  1   TAB      Ectopic      Multiple      Live Births               Home Medications    Prior to Admission medications   Medication Sig Start Date End Date Taking? Authorizing Provider  alendronate (FOSAMAX) 70 MG tablet Take 70 mg by mouth once a week. Take with a full glass of water on an empty stomach.(Monday)    [provider]  atorvastatin (LIPITOR) 20 MG tablet Take 1 tablet (20 mg total) by mouth daily. 02/18/16   Jacquelin Hawking, PA-C  cyclobenzaprine (FLEXERIL) 10 MG tablet Take 1 tablet (10 mg total) by mouth 3 (three) times daily as needed. 12/16/16   Triplett, Tammy, PA-C  folic acid (FOLVITE) 1 MG tablet Take 1  mg by mouth daily.    [provider]  HYDROcodone-acetaminophen (NORCO/VICODIN) 5-325 MG tablet Take one tab po q 4 hrs prn pain 12/16/16   Triplett, Tammy, PA-C  IRON PO Take 1 tablet by mouth daily.    [provider]  methotrexate (RHEUMATREX) 2.5 MG tablet Take 20 mg by mouth once a week. Caution:Chemotherapy. Protect from light.(Thursday)    [provider]  metroNIDAZOLE (FLAGYL) 500 MG tablet Take 1 tablet (500 mg total) by mouth 2 (two) times daily. 04/23/16   Tilda Burrow, MD  Multiple Vitamin (MULTIVITAMIN WITH MINERALS) TABS Take 1 tablet by mouth daily. Reported on 05/02/2015    [provider]  naproxen (NAPROSYN) 500 MG tablet Take 500 mg by mouth 2 (two) times daily with a meal.    [provider]  omeprazole (PRILOSEC) 40 MG capsule Take 1 capsule (40 mg total) by mouth daily. 02/18/16   Jacquelin Hawking, PA-C  predniSONE (DELTASONE) 10 MG tablet Take 6 tablets day one, 5 tablets day two, 4 tablets day three, 3 tablets day four, 2 tablets day five, then 1 tablet day six 04/12/17   Burgess Amor, PA-C    Family History Family History  Problem Relation Age of Onset  . Cancer Father        lung  . Hypertension Mother   . Hypertension Daughter   . Anesthesia problems Neg Hx     Social History Social History   Tobacco Use  . Smoking status: Current Every Day Smoker    Packs/day: 1.00    Years: 30.00    Pack years: 30.00    Types: Cigarettes  . Smokeless tobacco: Never Used  Substance Use Topics  . Alcohol use: No    Alcohol/week: 0.0 oz  . Drug use: No     Allergies   Voltaren [diclofenac sodium] and Codeine   Review of Systems Review of Systems  Constitutional: Negative for activity change.       All ROS Neg except as noted in HPI  HENT: Negative for nosebleeds.   Eyes: Negative for photophobia and discharge.  Respiratory: Negative for cough, shortness of breath and wheezing.   Cardiovascular: Negative for chest pain and palpitations.  Gastrointestinal: Negative for abdominal pain and blood in stool.  Genitourinary: Negative for dysuria, frequency and hematuria.  Musculoskeletal: Positive for arthralgias. Negative for back pain and neck pain.  Skin: Negative.   Neurological: Negative for dizziness, seizures and speech difficulty.  Psychiatric/Behavioral: Negative for confusion and hallucinations.     Physical Exam Updated Vital Signs BP (!) 133/92 (BP Location: Right Arm)   Pulse 93   Temp 98.1 F (36.7 C) (Oral)   Resp 20   Wt 67.1 kg (148 lb)   SpO2 98%   BMI 24.63 kg/m   Physical Exam  Constitutional: She is oriented to person, place, and time. She appears well-developed and well-nourished.  Non-toxic appearance.  HENT:  Head:  Normocephalic.  Right Ear: Tympanic membrane and external ear normal.  Left Ear: Tympanic membrane and external ear normal.  Eyes: Pupils are equal, round, and reactive to light. EOM and lids are normal.  Neck: Normal range of motion. Neck supple. Carotid bruit is not present.  Cardiovascular: Normal rate, regular rhythm, normal heart sounds, intact distal pulses and normal pulses.  Pulmonary/Chest: Breath sounds normal. No respiratory distress.  Abdominal: Soft. Bowel sounds are normal. There is no tenderness. There is no guarding.  Musculoskeletal:       Right  shoulder: She exhibits pain.       Right knee: She exhibits decreased range of motion and effusion. Tenderness found. Lateral joint line tenderness noted.  Patient is range of motion of the right shoulder, but with some discomfort.  There is no palpable deformity of the posterior or anterior ribs on the right.  There is no deformity of the scapula.  No deformity of the right clavicle.  There is full range of motion of the fingers, wrist, elbow.  The radial pulses are 2+.  There are degenerative joint disease changes of the right and left upper extremity.  There is good range of motion of the right hip.  No deformity appreciated.  There is no effusion of the right knee, there are arthritic changes noted of the right and left knee.  There is no posterior mass appreciated.  The anterior tibial tuberosity is intact.  There is no deformity of the quadricep area.  There is no deformity of the tibial area.  There is full range of motion of the right ankle and toes.  Lymphadenopathy:       Head (right side): No submandibular adenopathy present.       Head (left side): No submandibular adenopathy present.    She has no cervical adenopathy.  Neurological: She is alert and oriented to person, place, and time. She has normal strength. No cranial nerve deficit or sensory deficit.  Skin: Skin is warm and dry.  Psychiatric: She has a normal mood and  affect. Her speech is normal.  Nursing note and vitals reviewed.    ED Treatments / Results  Labs (all labs ordered are listed, but only abnormal results are displayed) Labs Reviewed - No data to display  EKG None  Radiology No results found.  Procedures Procedures (including critical care time)  Medications Ordered in ED Medications - No data to display   Initial Impression / Assessment and Plan / ED Course  I have reviewed the triage vital signs and the nursing notes.  Pertinent labs & imaging results that were available during my care of the patient were reviewed by me and considered in my medical decision making (see chart for details).       Final Clinical Impressions(s) / ED Diagnoses  MDM  Vital signs reviewed.  Patient sustained a fall on last evening.  She has pain and swelling of her knee.  She states that she has swelling in this knee from time to time related to her arthritis, but the swelling and the pain seemed to be worse since the fall.  She has pain of her buttocks area, and she has pain of the right shoulder.  No deformity appreciated of the shoulder, no neurovascular deficit appreciated. Xray of the right knee is positive for effusion only. No fx or dislocation.  I suspect that the patient is having an exacerbation of her joint disease aggravated by the recent fall.  Patient will be treated with intramuscular Decadron and intramuscular Toradol.  She is asked to continue her current medications and to be in contact with her primary physician as well as her rheumatology specialist concerning her continued pain issue.   Final diagnoses:  Fall, initial encounter  Effusion of right knee    ED Discharge Orders    None       Ivery Quale, PA-C 05/10/17 1721    Mesner, Barbara Cower, MD 05/10/17 [phone]

## 2017-05-27 IMAGING — MR MR FOOT*L* W/O CM
5 of 9 series · 21 of 40 positions shown · non-contrast
Comparison: None.

CLINICAL DATA: Osteoporosis, left hindfoot pain, on able to
dorsiflex

EXAM:
MRI OF THE LEFT FOREFOOT WITHOUT CONTRAST
TECHNIQUE: Multiplanar, multisequence MR imaging was performed. No intravenous
contrast was administered.

[Series 14: T1 · axial · 3.0mm · 0.31mm/px · z∈[-3,+74]mm · 4 of 23 slices shown (1 of 4)]
[im 1/23]
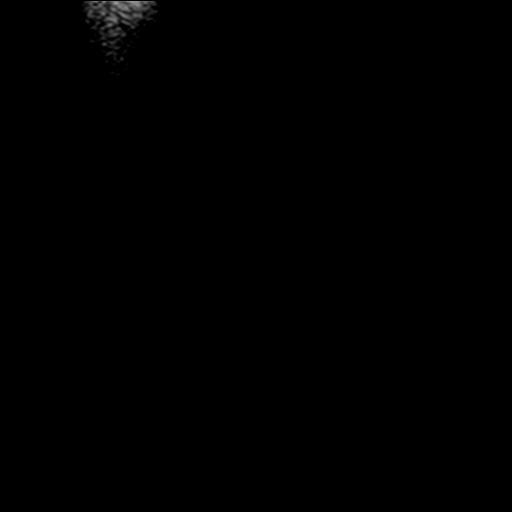
[im 8/23]
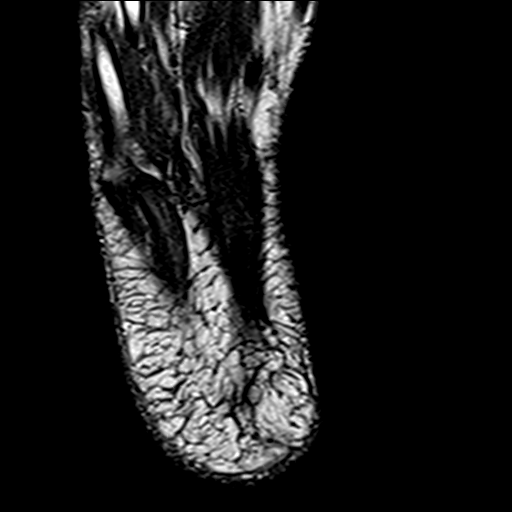
[im 15/23]
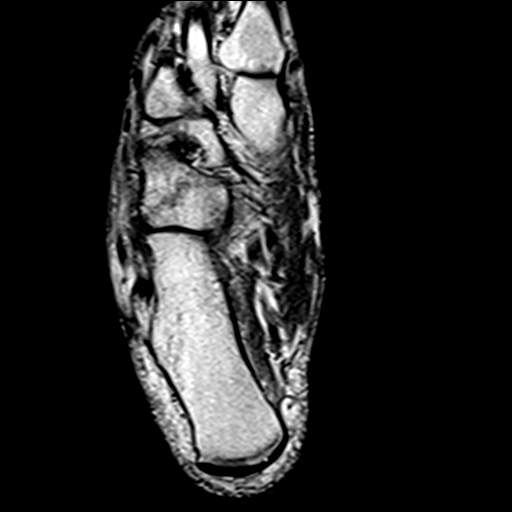
[im 23/23]
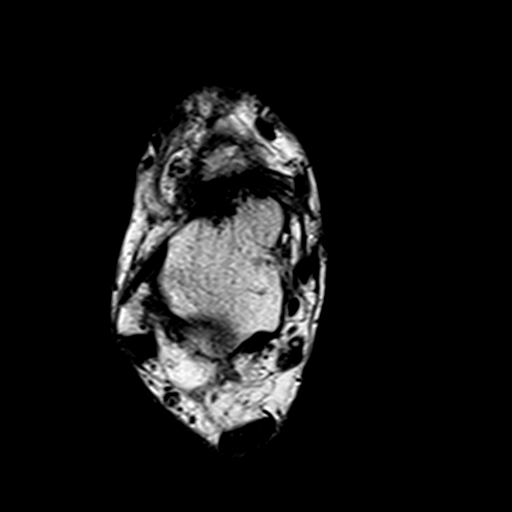

[Series 15: t2fs axial · axial · 3.0mm · 0.31mm/px · z∈[-3,+110]mm · 4 of 44 slices shown]
[im 1/44]
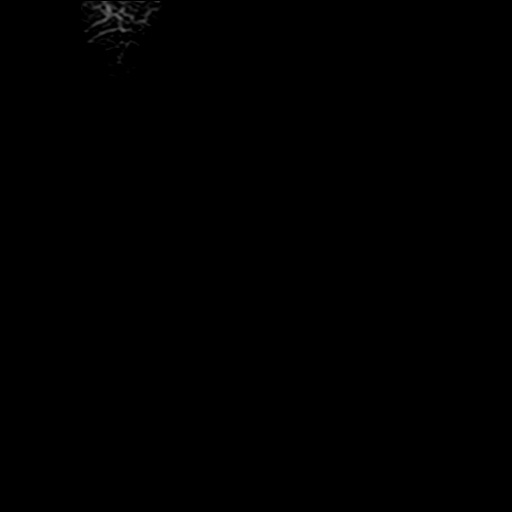
[im 11/44]
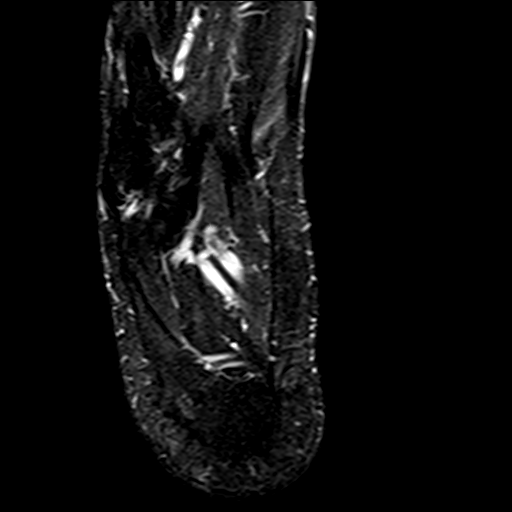
[im 22/44]
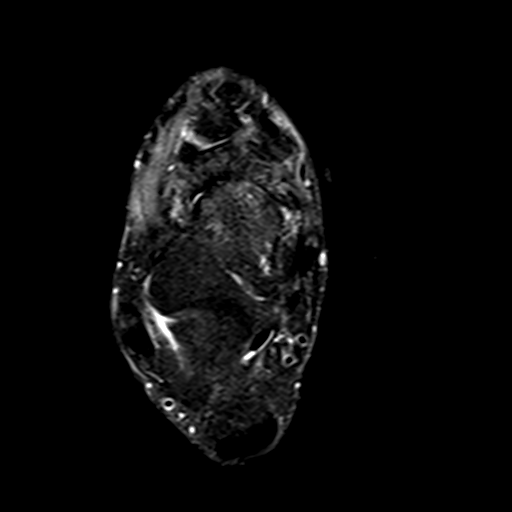
[im 33/44]
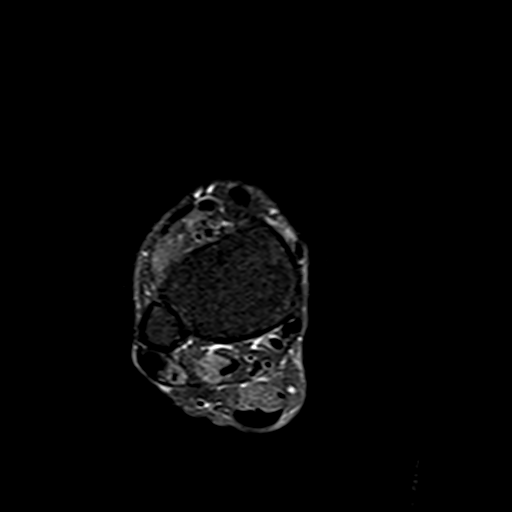

[Series 17: T1 · coronal · 3.0mm · 0.34mm/px · 5 of 42 slices shown (2 of 4)]
[im 1/42]
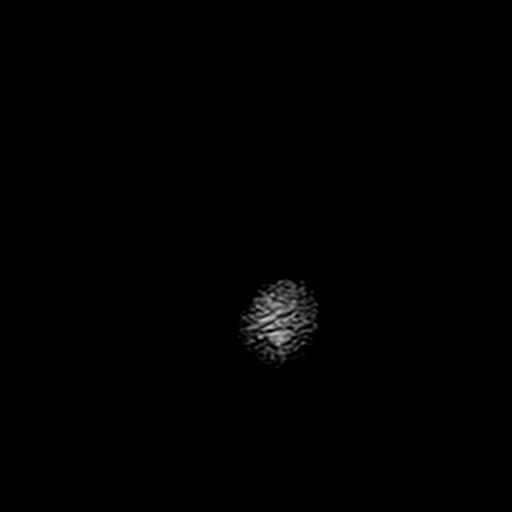
[im 11/42]
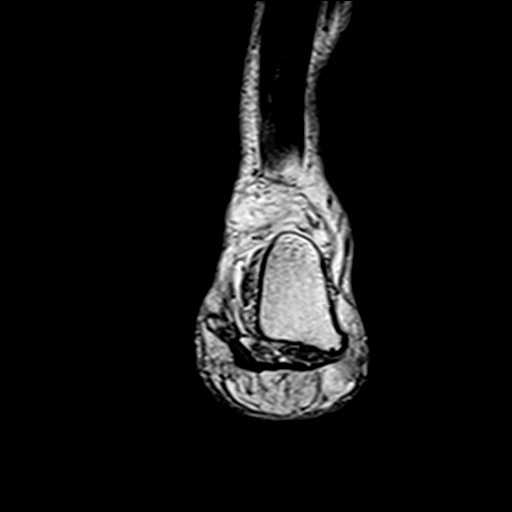
[im 21/42]
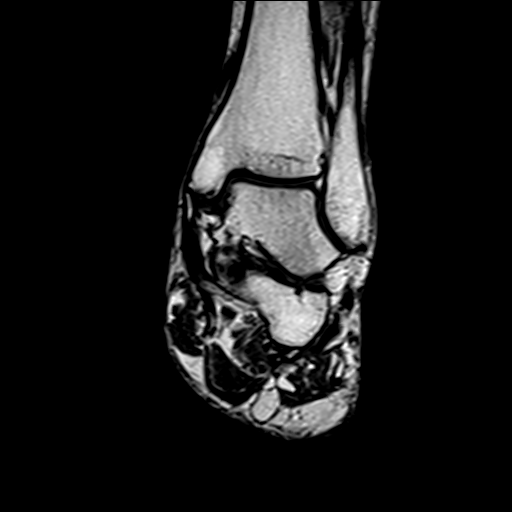
[im 31/42]
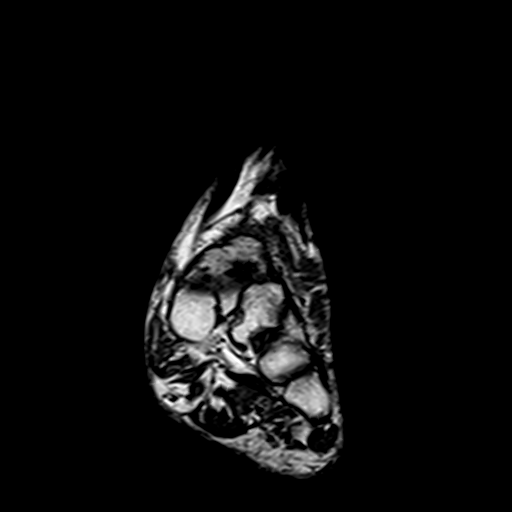
[im 42/42]
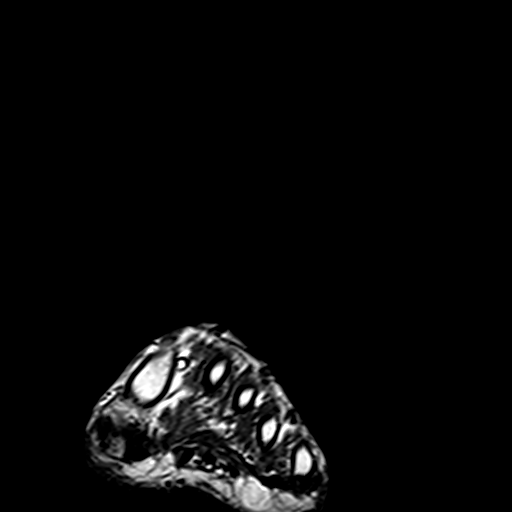

[Series 19: T1 · sagittal · 3.0mm · 0.68mm/px · 3 of 22 slices shown (3 of 4)]
[im 1/22]
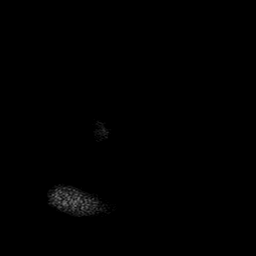
[im 11/22]
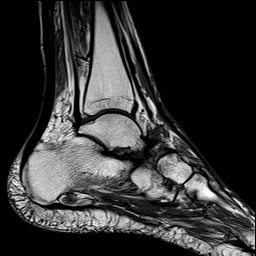
[im 22/22]
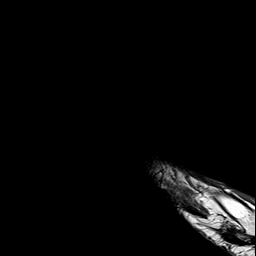

[Series 22: T1 · coronal · 3.0mm · 0.34mm/px · 5 of 42 slices shown (4 of 4)]
[im 1/42]
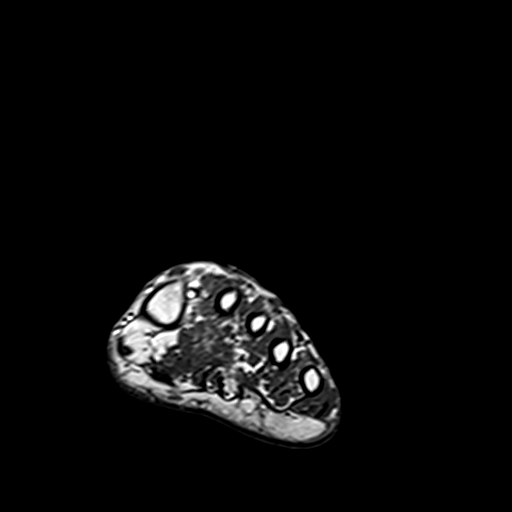
[im 11/42]
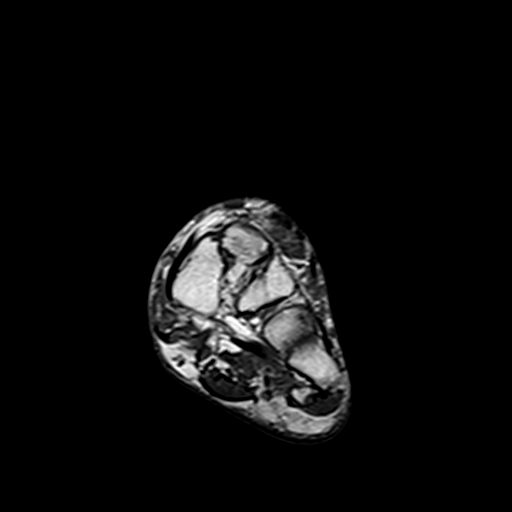
[im 21/42]
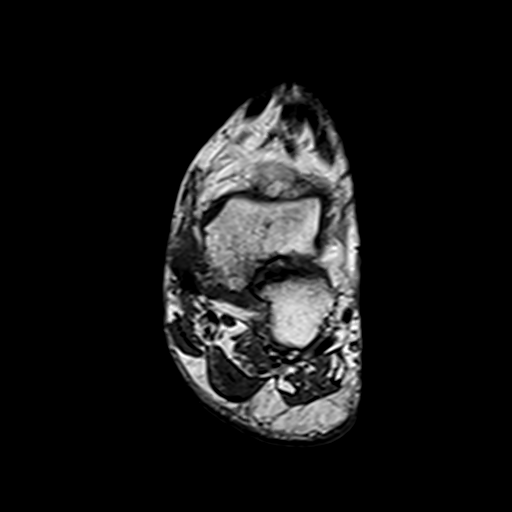
[im 31/42]
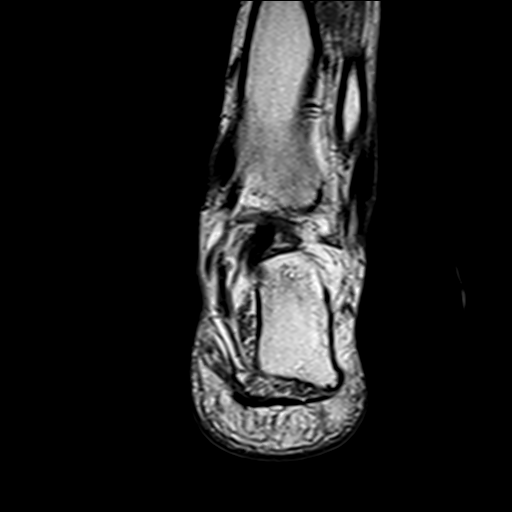
[im 42/42]
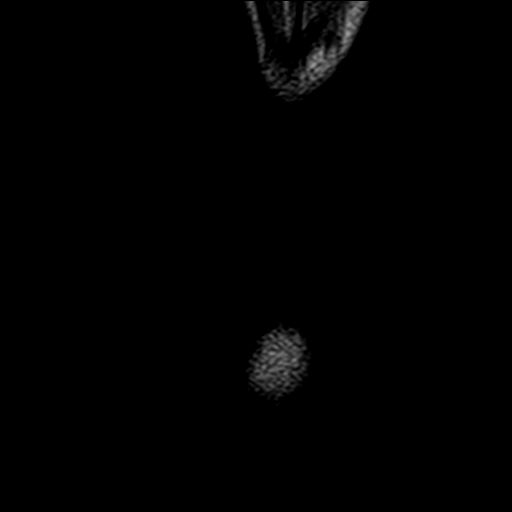

[21 of 40 positions shown; findings below may reference images not displayed]

FINDINGS: TENDONS

Peroneal: Peroneal longus tendon intact. Peroneal brevis intact.

Posteromedial: Posterior tibial tendon intact. Flexor hallucis
longus tendon intact. Flexor digitorum longus tendon intact.

Anterior: Tibialis anterior tendon intact. Extensor hallucis longus
tendon intact Extensor digitorum longus tendon intact.

Achilles:  Intact.

Plantar Fascia: Intact.

LIGAMENTS

Lateral: Anterior talofibular ligament intact. Calcaneofibular
ligament intact. Posterior talofibular ligament intact. Anterior and
posterior tibiofibular ligaments intact.

Medial: Deltoid ligament intact. Spring ligament intact.

CARTILAGE

Ankle Joint: No joint effusion. Normal ankle mortise. No chondral
defect.

Subtalar Joints/Sinus Tarsi: Normal subtalar joints. No subtalar
joint effusion. Normal sinus tarsi.

Bones: No acute fracture or dislocation. Severe osteoarthritis of
the talonavicular joint with collapse of the medial half of the
navicular as can be seen with the sequela of avascular necrosis.
Mild osteoarthritis of the calcaneocuboid joint.

Soft Tissue: Mild muscle edema in the flexor hallucis longus muscle
at the musculotendinous junction.
IMPRESSION: 1. Severe osteoarthritis of the talonavicular joint with collapse of
the medial half of the navicular as can be seen with the sequela of
avascular necrosis.
2. Mild osteoarthritis of the calcaneocuboid joint.
3. Mild muscle edema in the flexor hallucis longus muscle at the
musculotendinous junction most concerning for muscle strain.

## 2017-06-28 ENCOUNTER — Encounter (HOSPITAL_COMMUNITY): Payer: Self-pay | Admitting: Emergency Medicine

## 2017-06-28 ENCOUNTER — Other Ambulatory Visit: Payer: Self-pay

## 2017-06-28 ENCOUNTER — Emergency Department (HOSPITAL_COMMUNITY)
Admission: EM | Admit: 2017-06-28 | Discharge: 2017-06-28 | Disposition: A | Discharge status: Home / Self Care | Payer: Self-pay | Attending: Emergency Medicine | Admitting: Emergency Medicine

## 2017-06-28 DIAGNOSIS — Z79899 Other long term (current) drug therapy: Secondary | ICD-10-CM | POA: Insufficient documentation

## 2017-06-28 DIAGNOSIS — F17210 Nicotine dependence, cigarettes, uncomplicated: Secondary | ICD-10-CM | POA: Insufficient documentation

## 2017-06-28 DIAGNOSIS — M25561 Pain in right knee: Secondary | ICD-10-CM | POA: Insufficient documentation

## 2017-06-28 DIAGNOSIS — M069 Rheumatoid arthritis, unspecified: Secondary | ICD-10-CM | POA: Insufficient documentation

## 2017-06-28 DIAGNOSIS — I10 Essential (primary) hypertension: Secondary | ICD-10-CM | POA: Insufficient documentation

## 2017-06-28 DIAGNOSIS — J449 Chronic obstructive pulmonary disease, unspecified: Secondary | ICD-10-CM | POA: Insufficient documentation

## 2017-06-28 DIAGNOSIS — G8929 Other chronic pain: Secondary | ICD-10-CM | POA: Insufficient documentation

## 2017-06-28 MED ORDER — KETOROLAC TROMETHAMINE 30 MG/ML IJ SOLN
30.0000 mg | Freq: Once | INTRAMUSCULAR | Status: AC
  Administered 2017-06-28: 30 mg via INTRAMUSCULAR
  Filled 2017-06-28: qty 1

## 2017-06-28 MED ORDER — DEXAMETHASONE SODIUM PHOSPHATE 4 MG/ML IJ SOLN
10.0000 mg | Freq: Once | INTRAMUSCULAR | Status: AC
  Administered 2017-06-28: 10 mg via INTRAMUSCULAR
  Filled 2017-06-28: qty 3

## 2017-06-28 NOTE — Discharge Instructions (Addendum)
Follow-up with your rheumatologist for recheck your primary provider.

## 2017-06-28 NOTE — ED Triage Notes (Signed)
Pt reports chronic right knee pain.  Worse x 2 days.

## 2017-06-28 NOTE — ED Provider Notes (Signed)
[facility] EMERGENCY DEPARTMENT Provider Note   CSN: [id number] Arrival date & time: 06/28/17  1419     History   Chief Complaint Chief Complaint  Patient presents with  . Knee Pain    HPI [name] is a 52 y.o. female.  HPI   [name] is a 52 y.o. female who presents to the Emergency Department complaining of worsening of her chronic right knee pain.  She describes a constant, aching pain all around her right knee.  Pain is worse with weightbearing.  Symptoms have been present for 2 days. Requesting pain control.  She denies known injury.  She reports history of frequent RA flares to other joints, mostly her hands and elbows but states this pain feels similar to that.  She does admit to an injury of her right knee in April.  She denies swelling, fever, chills, redness or excessive warmth of the joint.  She has been taking her biologic medication without relief.  She is followed by a rheumatologist in [place].     Past Medical History:  Diagnosis Date  . Arthritis, rheumatoid (HCC)   . Chronic anemia   . Chronic pain   . COPD (chronic obstructive pulmonary disease) (HCC)   . Depression   . Hypertension   . Incidental lung nodule   . Osteoporosis   . Rheumatoid arthritis(714.0)   . Trichimoniasis     Patient Active Problem List   Diagnosis Date Noted  . Trichomonas vaginitis 04/23/2016  . HSIL (high grade squamous intraepithelial lesion) on Pap smear of cervix 04/23/2016  . Hypokalemia 05/05/2015  . Absolute anemia 05/05/2015  . Anxiety disorder 05/05/2015  . Fracture, cuboid 03/05/2015  . Pathological fracture of pelvis 03/05/2015  . Pathological fracture of pelvis due to osteoporosis (HCC) 02/20/2015  . Osteoporosis 02/20/2015  . Chronic pain 02/04/2015  . Esophageal reflux 02/04/2015  . Subclinical hyperthyroidism 02/04/2015  . Gastritis 01/07/2015  . Rheumatoid arthritis (HCC) 11/19/2014  . Cigarette nicotine dependence, uncomplicated  11/14/2014  . Hyperlipidemia 11/14/2014    Past Surgical History:  Procedure Laterality Date  . BREAST CYST EXCISION  12/31/2010   Procedure: CYST EXCISION BREAST;  Surgeon: Fabio Bering, MD;  Location: [facility];  Service: General;  Laterality: Right;  Excision Sebaceous Cyst Right Breast  . ENDOMETRIAL ABLATION    . TUBAL LIGATION       OB History    Gravida  6   Para  5   Term  5   Preterm      AB  1   Living  5     SAB  1   TAB      Ectopic      Multiple      Live Births               Home Medications    Prior to Admission medications   Medication Sig Start Date End Date Taking? Authorizing Provider  alendronate (FOSAMAX) 70 MG tablet Take 70 mg by mouth once a week. Take with a full glass of water on an empty stomach.(Monday)    [provider]  atorvastatin (LIPITOR) 20 MG tablet Take 1 tablet (20 mg total) by mouth daily. 02/18/16   Jacquelin Hawking, PA-C  cyclobenzaprine (FLEXERIL) 10 MG tablet Take 1 tablet (10 mg total) by mouth 3 (three) times daily as needed. 12/16/16   Babara Buffalo, PA-C  folic acid (FOLVITE) 1 MG tablet Take 1 mg by mouth daily.  [provider]  HYDROcodone-acetaminophen (NORCO/VICODIN) 5-325 MG tablet Take one tab po q 4 hrs prn pain 12/16/16   Matie Dimaano, PA-C  IRON PO Take 1 tablet by mouth daily.    [provider]  methotrexate (RHEUMATREX) 2.5 MG tablet Take 20 mg by mouth once a week. Caution:Chemotherapy. Protect from light.(Thursday)    [provider]  metroNIDAZOLE (FLAGYL) 500 MG tablet Take 1 tablet (500 mg total) by mouth 2 (two) times daily. 04/23/16   Tilda Burrow, MD  Multiple Vitamin (MULTIVITAMIN WITH MINERALS) TABS Take 1 tablet by mouth daily. Reported on 05/02/2015    [provider]  naproxen (NAPROSYN) 500 MG tablet Take 500 mg by mouth 2 (two) times daily with a meal.    [provider]  omeprazole (PRILOSEC) 40 MG capsule Take 1 capsule (40 mg  total) by mouth daily. 02/18/16   Jacquelin Hawking, PA-C  predniSONE (DELTASONE) 10 MG tablet Take 6 tablets day one, 5 tablets day two, 4 tablets day three, 3 tablets day four, 2 tablets day five, then 1 tablet day six 04/12/17   Burgess Amor, PA-C    Family History Family History  Problem Relation Age of Onset  . Cancer Father        lung  . Hypertension Mother   . Hypertension Daughter   . Anesthesia problems Neg Hx     Social History Social History   Tobacco Use  . Smoking status: Current Every Day Smoker    Packs/day: 1.00    Years: 30.00    Pack years: 30.00    Types: Cigarettes  . Smokeless tobacco: Never Used  Substance Use Topics  . Alcohol use: No    Alcohol/week: 0.0 oz  . Drug use: No     Allergies   Voltaren [diclofenac sodium] and Codeine   Review of Systems Review of Systems  Constitutional: Negative for chills and fever.  Musculoskeletal: Positive for arthralgias (right knee pain). Negative for joint swelling.  Skin: Negative for color change and wound.  Neurological: Negative for weakness and numbness.  All other systems reviewed and are negative.    Physical Exam Updated Vital Signs BP (!) 164/91 (BP Location: Right Arm)   Pulse 68   Temp 98 F (36.7 C) (Oral)   Resp 18   Ht 5\' 5"  (1.651 m)   Wt 65.8 kg (145 lb)   SpO2 100%   BMI 24.13 kg/m   Physical Exam  Constitutional: She is oriented to person, place, and time. She appears well-developed and well-nourished. No distress.  Cardiovascular: Normal rate, regular rhythm and intact distal pulses.  Pulmonary/Chest: Effort normal and breath sounds normal.  Musculoskeletal: She exhibits tenderness. She exhibits no edema.  ttp of the anterior right knee.  No erythema, effusion, excessive warmth or step-off deformity.  DP pulse brisk, distal sensation intact.  Negative Homans sign  Neurological: She is alert and oriented to person, place, and time. She exhibits normal muscle tone. Coordination  normal.  Skin: Skin is warm and dry. Capillary refill takes less than 2 seconds. No erythema.  Psychiatric: She has a normal mood and affect.  Nursing note and vitals reviewed.    ED Treatments / Results  Labs (all labs ordered are listed, but only abnormal results are displayed) Labs Reviewed - No data to display  EKG None  Radiology No results found.  Procedures Procedures (including critical care time)  Medications Ordered in ED Medications  dexamethasone (DECADRON) injection 10 mg (10 mg  Intramuscular Given 06/28/17 1612)  ketorolac (TORADOL) 30 MG/ML injection 30 mg (30 mg Intramuscular Given 06/28/17 1613)     Initial Impression / Assessment and Plan / ED Course  I have reviewed the triage vital signs and the nursing notes.  Pertinent labs & imaging results that were available during my care of the patient were reviewed by me and considered in my medical decision making (see chart for details).     Pt well appearing.  Likely right knee pain secondary to RA flare vs subacute injury.  XR of right knee from April was negative for bony injury. Pt has rheumatologist in [place], agrees to close f/u  Final Clinical Impressions(s) / ED Diagnoses   Final diagnoses:  Chronic pain of right knee    ED Discharge Orders    None       Pauline Aus, PA-C 06/28/17 1651    Long, Arlyss Repress, MD 06/29/17 1045

## 2017-08-01 ENCOUNTER — Other Ambulatory Visit: Payer: Self-pay

## 2017-08-01 ENCOUNTER — Emergency Department (HOSPITAL_COMMUNITY)
Admission: EM | Admit: 2017-08-01 | Discharge: 2017-08-01 | Disposition: A | Discharge status: Home / Self Care | Payer: Self-pay | Attending: Emergency Medicine | Admitting: Emergency Medicine

## 2017-08-01 ENCOUNTER — Encounter (HOSPITAL_COMMUNITY): Payer: Self-pay | Admitting: Emergency Medicine

## 2017-08-01 ENCOUNTER — Emergency Department (HOSPITAL_COMMUNITY): Payer: Self-pay

## 2017-08-01 DIAGNOSIS — S63502A Unspecified sprain of left wrist, initial encounter: Secondary | ICD-10-CM

## 2017-08-01 DIAGNOSIS — I10 Essential (primary) hypertension: Secondary | ICD-10-CM | POA: Insufficient documentation

## 2017-08-01 DIAGNOSIS — W1839XA Other fall on same level, initial encounter: Secondary | ICD-10-CM | POA: Insufficient documentation

## 2017-08-01 DIAGNOSIS — M25562 Pain in left knee: Secondary | ICD-10-CM | POA: Insufficient documentation

## 2017-08-01 DIAGNOSIS — G8929 Other chronic pain: Secondary | ICD-10-CM | POA: Insufficient documentation

## 2017-08-01 DIAGNOSIS — Y9389 Activity, other specified: Secondary | ICD-10-CM | POA: Insufficient documentation

## 2017-08-01 DIAGNOSIS — F17210 Nicotine dependence, cigarettes, uncomplicated: Secondary | ICD-10-CM | POA: Insufficient documentation

## 2017-08-01 DIAGNOSIS — S63592A Other specified sprain of left wrist, initial encounter: Secondary | ICD-10-CM | POA: Insufficient documentation

## 2017-08-01 DIAGNOSIS — J449 Chronic obstructive pulmonary disease, unspecified: Secondary | ICD-10-CM | POA: Insufficient documentation

## 2017-08-01 DIAGNOSIS — W19XXXA Unspecified fall, initial encounter: Secondary | ICD-10-CM

## 2017-08-01 DIAGNOSIS — Y999 Unspecified external cause status: Secondary | ICD-10-CM | POA: Insufficient documentation

## 2017-08-01 DIAGNOSIS — M25561 Pain in right knee: Secondary | ICD-10-CM | POA: Insufficient documentation

## 2017-08-01 DIAGNOSIS — Z79899 Other long term (current) drug therapy: Secondary | ICD-10-CM | POA: Insufficient documentation

## 2017-08-01 DIAGNOSIS — Y929 Unspecified place or not applicable: Secondary | ICD-10-CM | POA: Insufficient documentation

## 2017-08-01 MED ORDER — KETOROLAC TROMETHAMINE 30 MG/ML IJ SOLN
30.0000 mg | Freq: Once | INTRAMUSCULAR | Status: AC
  Administered 2017-08-01: 30 mg via INTRAMUSCULAR
  Filled 2017-08-01: qty 1

## 2017-08-01 MED ORDER — DEXAMETHASONE SODIUM PHOSPHATE 4 MG/ML IJ SOLN
10.0000 mg | Freq: Once | INTRAMUSCULAR | Status: AC
  Administered 2017-08-01: 10 mg via INTRAMUSCULAR
  Filled 2017-08-01: qty 3

## 2017-08-01 NOTE — Discharge Instructions (Addendum)
Elevate and apply ice packs on and off as needed.  Wear the wrist brace as needed for support.  Follow-up with your primary doctor or contact Dr. Mort Sawyers office in 1 week if the symptoms are not improving.

## 2017-08-01 NOTE — ED Provider Notes (Signed)
[facility] EMERGENCY DEPARTMENT Provider Note   CSN: [id number] Arrival date & time: 08/01/17  1326     History   Chief Complaint Chief Complaint  Patient presents with  . Fall    HPI [name] is a 52 y.o. female.  HPI   [name] is a 52 y.o. female with a history of RA and chronic pain, who presents to the Emergency Department complaining of left wrist and bilateral knee pain secondary to a mechanical fall that occurred this morning.  She states that she got up to go to the bathroom and fell forward landing on both knees and an outstretched left hand.  She reports some swelling to her wrist and pain to her knees upon standing.  She states the pain has worsened throughout the day.  She denies neck or back pain, head injury, LOC, nausea and dizziness.  No symptoms prior to the fall.   Past Medical History:  Diagnosis Date  . Arthritis, rheumatoid (HCC)   . Chronic anemia   . Chronic pain   . COPD (chronic obstructive pulmonary disease) (HCC)   . Depression   . Hypertension   . Incidental lung nodule   . Osteoporosis   . Rheumatoid arthritis(714.0)   . Trichimoniasis     Patient Active Problem List   Diagnosis Date Noted  . Trichomonas vaginitis 04/23/2016  . HSIL (high grade squamous intraepithelial lesion) on Pap smear of cervix 04/23/2016  . Hypokalemia 05/05/2015  . Absolute anemia 05/05/2015  . Anxiety disorder 05/05/2015  . Fracture, cuboid 03/05/2015  . Pathological fracture of pelvis 03/05/2015  . Pathological fracture of pelvis due to osteoporosis (HCC) 02/20/2015  . Osteoporosis 02/20/2015  . Chronic pain 02/04/2015  . Esophageal reflux 02/04/2015  . Subclinical hyperthyroidism 02/04/2015  . Gastritis 01/07/2015  . Rheumatoid arthritis (HCC) 11/19/2014  . Cigarette nicotine dependence, uncomplicated 11/14/2014  . Hyperlipidemia 11/14/2014    Past Surgical History:  Procedure Laterality Date  . BREAST CYST EXCISION  12/31/2010   Procedure: CYST EXCISION BREAST;  Surgeon: Fabio Bering, MD;  Location: [facility];  Service: General;  Laterality: Right;  Excision Sebaceous Cyst Right Breast  . ENDOMETRIAL ABLATION    . TUBAL LIGATION       OB History    Gravida  6   Para  5   Term  5   Preterm      AB  1   Living  5     SAB  1   TAB      Ectopic      Multiple      Live Births               Home Medications    Prior to Admission medications   Medication Sig Start Date End Date Taking? Authorizing Provider  alendronate (FOSAMAX) 70 MG tablet Take 70 mg by mouth once a week. Take with a full glass of water on an empty stomach.(Monday)    [provider]  atorvastatin (LIPITOR) 20 MG tablet Take 1 tablet (20 mg total) by mouth daily. 02/18/16   Jacquelin Hawking, PA-C  cyclobenzaprine (FLEXERIL) 10 MG tablet Take 1 tablet (10 mg total) by mouth 3 (three) times daily as needed. 12/16/16   Allyse Fregeau, PA-C  folic acid (FOLVITE) 1 MG tablet Take 1 mg by mouth daily.    [provider]  HYDROcodone-acetaminophen (NORCO/VICODIN) 5-325 MG tablet Take one tab po q 4 hrs prn pain 12/16/16  Samirah Scarpati, PA-C  IRON PO Take 1 tablet by mouth daily.    [provider]  methotrexate (RHEUMATREX) 2.5 MG tablet Take 20 mg by mouth once a week. Caution:Chemotherapy. Protect from light.(Thursday)    [provider]  metroNIDAZOLE (FLAGYL) 500 MG tablet Take 1 tablet (500 mg total) by mouth 2 (two) times daily. 04/23/16   Tilda Burrow, MD  Multiple Vitamin (MULTIVITAMIN WITH MINERALS) TABS Take 1 tablet by mouth daily. Reported on 05/02/2015    [provider]  naproxen (NAPROSYN) 500 MG tablet Take 500 mg by mouth 2 (two) times daily with a meal.    [provider]  omeprazole (PRILOSEC) 40 MG capsule Take 1 capsule (40 mg total) by mouth daily. 02/18/16   Jacquelin Hawking, PA-C  predniSONE (DELTASONE) 10 MG tablet Take 6 tablets day one, 5 tablets day two, 4  tablets day three, 3 tablets day four, 2 tablets day five, then 1 tablet day six 04/12/17   Burgess Amor, PA-C    Family History Family History  Problem Relation Age of Onset  . Cancer Father        lung  . Hypertension Mother   . Hypertension Daughter   . Anesthesia problems Neg Hx     Social History Social History   Tobacco Use  . Smoking status: Current Every Day Smoker    Packs/day: 1.00    Years: 30.00    Pack years: 30.00    Types: Cigarettes  . Smokeless tobacco: Never Used  Substance Use Topics  . Alcohol use: No    Alcohol/week: 0.0 oz  . Drug use: No     Allergies   Voltaren [diclofenac sodium] and Codeine   Review of Systems Review of Systems  Constitutional: Negative for chills and fever.  Respiratory: Negative for shortness of breath.   Cardiovascular: Negative for chest pain.  Gastrointestinal: Negative for abdominal pain, nausea and vomiting.  Musculoskeletal: Positive for arthralgias (Left wrist and bilateral knee pain) and joint swelling (Swelling left wrist).  Skin: Negative for color change and wound.  Neurological: Negative for dizziness, syncope, weakness, numbness and headaches.  Psychiatric/Behavioral: Negative for confusion.  All other systems reviewed and are negative.    Physical Exam Updated Vital Signs BP (!) 144/88   Pulse 81   Temp 99.2 F (37.3 C) (Oral)   Resp 20   Ht 5' 5.5" (1.664 m)   Wt 65.8 kg (145 lb)   SpO2 100%   BMI 23.76 kg/m   Physical Exam  Constitutional: She appears well-developed. No distress.  HENT:  Head: Atraumatic.  Neck: Normal range of motion. Neck supple.  Cardiovascular: Normal rate, regular rhythm and intact distal pulses.  Pulmonary/Chest: Effort normal and breath sounds normal. No respiratory distress. She exhibits no tenderness.  Musculoskeletal: She exhibits tenderness. She exhibits no deformity.  Diffuse tenderness to palpation of the distal left wrist.  Mild edema noted.  No bony  deformities.  No pain proximal to the wrist.  Patient has full range of motion of the fingers.  Multiple Heberden's nodes noted at the DIPs of both hands.  Tenderness with range of motion of the bilateral knees.  No effusion or step-off deformities.  No spinal tenderness  Neurological: She is alert. No sensory deficit.  Skin: Skin is warm. Capillary refill takes less than 2 seconds.  Nursing note and vitals reviewed.    ED Treatments / Results  Labs (all labs ordered are listed, but only abnormal results are displayed)  Labs Reviewed - No data to display  EKG None  Radiology Dg Wrist Complete Left  Result Date: 08/01/2017 CLINICAL DATA:  Left wrist pain following a fall this morning. EXAM: LEFT WRIST - COMPLETE 3+ VIEW COMPARISON:  01/24/2012 and left hand dated 06/26/2013. FINDINGS: Stable widening of the scapholunate space. Mild radiocarpal joint space narrowing. No fracture or dislocation seen. IMPRESSION: 1. No fracture. 2. Stable widening of the scapholunate space suggesting previous ligamentous injury. Electronically Signed   By: Beckie Salts M.D.   On: 08/01/2017 15:20   Dg Knee Complete 4 Views Left  Result Date: 08/01/2017 CLINICAL DATA:  Right knee pain following a fall this morning. EXAM: LEFT KNEE - COMPLETE 4+ VIEW COMPARISON:  02/26/2016. FINDINGS: Small-bowel anterior patellar enthesophyte. Tiny tibial spine spurs. Minimal medial and posterior patellar spur formation. No fracture, dislocation or effusion. IMPRESSION: 1. No fracture. 2. Minimal degenerative changes. Electronically Signed   By: Beckie Salts M.D.   On: 08/01/2017 15:16   Dg Knee Complete 4 Views Right  Result Date: 08/01/2017 CLINICAL DATA:  Right knee pain following a fall this morning. EXAM: RIGHT KNEE - COMPLETE 4+ VIEW COMPARISON:  05/10/2017. FINDINGS: Again demonstrated is moderate medial joint space narrowing. No fracture, dislocation or effusion. IMPRESSION: Stable moderate medial joint space narrowing  without fracture. Electronically Signed   By: Beckie Salts M.D.   On: 08/01/2017 15:18    Procedures Procedures (including critical care time)  Medications Ordered in ED Medications  ketorolac (TORADOL) 30 MG/ML injection 30 mg (30 mg Intramuscular Given 08/01/17 1508)  dexamethasone (DECADRON) injection 10 mg (10 mg Intramuscular Given 08/01/17 1509)     Initial Impression / Assessment and Plan / ED Course  I have reviewed the triage vital signs and the nursing notes.  Pertinent labs & imaging results that were available during my care of the patient were reviewed by me and considered in my medical decision making (see chart for details).    Patient well-appearing.  Vitals reviewed. X-rays negative for acute fracture or dislocation.  Neurovascularly intact.  Compartments are soft.  Patient currently under the care of her rheumatologist.  No concerning symptoms for septic joint. Velcro wrist splint applied to left wrist.  Patient agrees to close follow-up with PCP or orthopedic in 1 week if needed.  Final Clinical Impressions(s) / ED Diagnoses   Final diagnoses:  Fall, initial encounter  Sprain of left wrist, initial encounter  Chronic pain of both knees    ED Discharge Orders    None       Pauline Aus, PA-C 08/01/17 1559    Mesner, Barbara Cower, MD 08/03/17 [phone]

## 2017-08-01 NOTE — ED Triage Notes (Signed)
Patient c/o bilateral knee pain and wrist pain after falling this morning. Patient states she tried to catch herself while falling forward and landed on knees and hands. Patient states left wrist more painful. Denies LOC or dizziness.

## 2017-08-18 ENCOUNTER — Emergency Department (HOSPITAL_COMMUNITY)
Admission: EM | Admit: 2017-08-18 | Discharge: 2017-08-18 | Disposition: A | Discharge status: Home / Self Care | Payer: Self-pay | Attending: Emergency Medicine | Admitting: Emergency Medicine

## 2017-08-18 ENCOUNTER — Encounter (HOSPITAL_COMMUNITY): Payer: Self-pay | Admitting: Emergency Medicine

## 2017-08-18 ENCOUNTER — Other Ambulatory Visit: Payer: Self-pay

## 2017-08-18 ENCOUNTER — Emergency Department (HOSPITAL_COMMUNITY): Payer: Self-pay

## 2017-08-18 DIAGNOSIS — I10 Essential (primary) hypertension: Secondary | ICD-10-CM | POA: Insufficient documentation

## 2017-08-18 DIAGNOSIS — Z79899 Other long term (current) drug therapy: Secondary | ICD-10-CM | POA: Insufficient documentation

## 2017-08-18 DIAGNOSIS — F17210 Nicotine dependence, cigarettes, uncomplicated: Secondary | ICD-10-CM | POA: Insufficient documentation

## 2017-08-18 DIAGNOSIS — M25562 Pain in left knee: Secondary | ICD-10-CM | POA: Insufficient documentation

## 2017-08-18 MED ORDER — KETOROLAC TROMETHAMINE 30 MG/ML IJ SOLN
30.0000 mg | Freq: Once | INTRAMUSCULAR | Status: AC
  Administered 2017-08-18: 30 mg via INTRAMUSCULAR
  Filled 2017-08-18: qty 1

## 2017-08-18 NOTE — ED Triage Notes (Signed)
Pt complaining of right knee pain after vacuuming.

## 2017-08-18 NOTE — ED Provider Notes (Signed)
[facility] EMERGENCY DEPARTMENT Provider Note   CSN: [id number] Arrival date & time: 08/18/17  1310     History   Chief Complaint Chief Complaint  Patient presents with  . Knee Pain    HPI [name] is a 52 y.o. female.  HPI  [name] is a 52 y.o. female with history of RA, who presents to the Emergency Department complaining of left knee pain and swelling that began yesterday.  She states she was vacuuming the floor when she felt a "pop" in her knee and shortly after noticed swelling to her knee.  Today, she states the swelling is worse and she is having difficulty with weightbearing.  She denies fall.  Pain is worse with movement or weightbearing.  She denies numbness, hip pain, or swelling distal to the knee.   Past Medical History:  Diagnosis Date  . Arthritis, rheumatoid (HCC)   . Chronic anemia   . Chronic pain   . COPD (chronic obstructive pulmonary disease) (HCC)   . Depression   . Hypertension   . Incidental lung nodule   . Osteoporosis   . Rheumatoid arthritis(714.0)   . Trichimoniasis     Patient Active Problem List   Diagnosis Date Noted  . Trichomonas vaginitis 04/23/2016  . HSIL (high grade squamous intraepithelial lesion) on Pap smear of cervix 04/23/2016  . Hypokalemia 05/05/2015  . Absolute anemia 05/05/2015  . Anxiety disorder 05/05/2015  . Fracture, cuboid 03/05/2015  . Pathological fracture of pelvis 03/05/2015  . Pathological fracture of pelvis due to osteoporosis (HCC) 02/20/2015  . Osteoporosis 02/20/2015  . Chronic pain 02/04/2015  . Esophageal reflux 02/04/2015  . Subclinical hyperthyroidism 02/04/2015  . Gastritis 01/07/2015  . Rheumatoid arthritis (HCC) 11/19/2014  . Cigarette nicotine dependence, uncomplicated 11/14/2014  . Hyperlipidemia 11/14/2014    Past Surgical History:  Procedure Laterality Date  . BREAST CYST EXCISION  12/31/2010   Procedure: CYST EXCISION BREAST;  Surgeon: Fabio Bering, MD;  Location: [facility];   Service: General;  Laterality: Right;  Excision Sebaceous Cyst Right Breast  . ENDOMETRIAL ABLATION    . TUBAL LIGATION       OB History    Gravida  6   Para  5   Term  5   Preterm      AB  1   Living  5     SAB  1   TAB      Ectopic      Multiple      Live Births               Home Medications    Prior to Admission medications   Medication Sig Start Date End Date Taking? Authorizing Provider  alendronate (FOSAMAX) 70 MG tablet Take 70 mg by mouth once a week. Take with a full glass of water on an empty stomach.(Monday)    [provider]  atorvastatin (LIPITOR) 20 MG tablet Take 1 tablet (20 mg total) by mouth daily. 02/18/16   Jacquelin Hawking, PA-C  cyclobenzaprine (FLEXERIL) 10 MG tablet Take 1 tablet (10 mg total) by mouth 3 (three) times daily as needed. 12/16/16   Vianny Schraeder, PA-C  folic acid (FOLVITE) 1 MG tablet Take 1 mg by mouth daily.    [provider]  HYDROcodone-acetaminophen (NORCO/VICODIN) 5-325 MG tablet Take one tab po q 4 hrs prn pain 12/16/16   Willman Cuny, PA-C  IRON PO Take 1 tablet by mouth daily.    [provider]  methotrexate (RHEUMATREX) 2.5 MG tablet Take 20 mg by mouth once a week. Caution:Chemotherapy. Protect from light.(Thursday)    [provider]  metroNIDAZOLE (FLAGYL) 500 MG tablet Take 1 tablet (500 mg total) by mouth 2 (two) times daily. 04/23/16   Tilda Burrow, MD  Multiple Vitamin (MULTIVITAMIN WITH MINERALS) TABS Take 1 tablet by mouth daily. Reported on 05/02/2015    [provider]  naproxen (NAPROSYN) 500 MG tablet Take 500 mg by mouth 2 (two) times daily with a meal.    [provider]  omeprazole (PRILOSEC) 40 MG capsule Take 1 capsule (40 mg total) by mouth daily. 02/18/16   Jacquelin Hawking, PA-C  predniSONE (DELTASONE) 10 MG tablet Take 6 tablets day one, 5 tablets day two, 4 tablets day three, 3 tablets day four, 2 tablets day five, then 1 tablet day six  04/12/17   Burgess Amor, PA-C    Family History Family History  Problem Relation Age of Onset  . Cancer Father        lung  . Hypertension Mother   . Hypertension Daughter   . Anesthesia problems Neg Hx     Social History Social History   Tobacco Use  . Smoking status: Current Every Day Smoker    Packs/day: 1.00    Years: 30.00    Pack years: 30.00    Types: Cigarettes  . Smokeless tobacco: Never Used  Substance Use Topics  . Alcohol use: No    Alcohol/week: 0.0 oz  . Drug use: No     Allergies   Voltaren [diclofenac sodium] and Codeine   Review of Systems Review of Systems  Constitutional: Negative for chills and fever.  Gastrointestinal: Negative for nausea and vomiting.  Musculoskeletal: Positive for arthralgias (Left knee pain) and joint swelling.  Skin: Negative for color change and wound.  Neurological: Negative for weakness and numbness.  All other systems reviewed and are negative.    Physical Exam Updated Vital Signs BP (!) 175/96 (BP Location: Right Arm)   Pulse 78   Temp 98.9 F (37.2 C) (Oral)   Resp 18   Ht 5' 5.5" (1.664 m)   Wt 65.8 kg (145 lb)   SpO2 100%   BMI 23.76 kg/m   Physical Exam  Constitutional: She appears well-developed and well-nourished. No distress.  Cardiovascular: Normal rate, regular rhythm and intact distal pulses.  Pulmonary/Chest: Effort normal and breath sounds normal.  Musculoskeletal: She exhibits edema and tenderness. She exhibits no deformity.  ttp of the superior left knee.  Mild to moderate soft tissue edema noted. no erythema, effusion, or step-off deformity.  Negative drawer sign.  No obvious instability on valgus and varus stress  Neurological: She is alert. No sensory deficit.  Skin: Skin is warm and dry. Capillary refill takes less than 2 seconds. No erythema.  Nursing note and vitals reviewed.    ED Treatments / Results  Labs (all labs ordered are listed, but only abnormal results are  displayed) Labs Reviewed - No data to display  EKG None  Radiology Dg Knee Complete 4 Views Left  Result Date: 08/18/2017 CLINICAL DATA:  Patient fell onto her left knee yesterday and has lateral swelling and anterolateral pain radiating down the leg. History of rheumatoid arthritis and osteoporosis. EXAM: LEFT KNEE - COMPLETE 4+ VIEW COMPARISON:  Left knee series dated August 01, 2017. FINDINGS: The bones are subjectively adequately mineralized. There is no lytic nor blastic lesion. No erosive or proliferative changes are observed.  Minimal beaking of the tibial spines is noted. The joint spaces are reasonably well-maintained. There is a moderate-sized suprapatellar effusion. IMPRESSION: There is no acute fracture or dislocation. There is no more than minimal degenerative change. There is a moderate-sized suprapatellar effusion. Electronically Signed   By: David  [place] M.D.   On: 08/18/2017 14:24    Procedures Procedures (including critical care time)  Medications Ordered in ED Medications - No data to display   Initial Impression / Assessment and Plan / ED Course  I have reviewed the triage vital signs and the nursing notes.  Pertinent labs & imaging results that were available during my care of the patient were reviewed by me and considered in my medical decision making (see chart for details).     Knee sleeve applied and x-ray neg for bony injury.  Patient prefers to follow-up with Dr. Hilda Lias.  She is weightbearing.  Final Clinical Impressions(s) / ED Diagnoses   Final diagnoses:  Acute pain of left knee    ED Discharge Orders    None       Pauline Aus, PA-C 08/18/17 1510    Benjiman Core, MD 08/18/17 1539

## 2017-08-18 NOTE — ED Notes (Signed)
Patient states she was cleaning yesterday and felt something pop in her L knee. Knee is swollen.

## 2017-08-18 NOTE — Discharge Instructions (Addendum)
Try applying ice on and off to your knee.  Wear the knee brace as needed for support but do not wear continuously or at bedtime.  You can contact Dr. Sanjuan Dame office to arrange a follow-up appointment if your pain is not improving

## 2017-08-26 ENCOUNTER — Other Ambulatory Visit: Payer: Self-pay

## 2017-08-26 ENCOUNTER — Encounter (HOSPITAL_COMMUNITY): Payer: Self-pay | Admitting: Emergency Medicine

## 2017-08-26 ENCOUNTER — Emergency Department (HOSPITAL_COMMUNITY): Payer: Self-pay

## 2017-08-26 ENCOUNTER — Emergency Department (HOSPITAL_COMMUNITY)
Admission: EM | Admit: 2017-08-26 | Discharge: 2017-08-27 | Disposition: A | Discharge status: Home / Self Care | Payer: Self-pay | Attending: Emergency Medicine | Admitting: Emergency Medicine

## 2017-08-26 DIAGNOSIS — I10 Essential (primary) hypertension: Secondary | ICD-10-CM | POA: Insufficient documentation

## 2017-08-26 DIAGNOSIS — K310 Acute dilatation of stomach: Secondary | ICD-10-CM | POA: Insufficient documentation

## 2017-08-26 DIAGNOSIS — J449 Chronic obstructive pulmonary disease, unspecified: Secondary | ICD-10-CM | POA: Insufficient documentation

## 2017-08-26 DIAGNOSIS — R197 Diarrhea, unspecified: Secondary | ICD-10-CM | POA: Insufficient documentation

## 2017-08-26 DIAGNOSIS — Z79899 Other long term (current) drug therapy: Secondary | ICD-10-CM | POA: Insufficient documentation

## 2017-08-26 DIAGNOSIS — F17210 Nicotine dependence, cigarettes, uncomplicated: Secondary | ICD-10-CM | POA: Insufficient documentation

## 2017-08-26 DIAGNOSIS — R1013 Epigastric pain: Secondary | ICD-10-CM | POA: Insufficient documentation

## 2017-08-26 LAB — CBC WITH DIFFERENTIAL/PLATELET
Basophils Absolute: 0 10*3/uL (ref 0.0–0.1)
Basophils Relative: 1 %
Eosinophils Absolute: 0.3 10*3/uL (ref 0.0–0.7)
Eosinophils Relative: 5 %
HEMATOCRIT: 32.4 % — AB (ref 36.0–46.0)
Hemoglobin: 10.1 g/dL — ABNORMAL LOW (ref 12.0–15.0)
LYMPHS ABS: 2.3 10*3/uL (ref 0.7–4.0)
LYMPHS PCT: 41 %
MCH: 24.9 pg — AB (ref 26.0–34.0)
MCHC: 31.2 g/dL (ref 30.0–36.0)
MCV: 79.8 fL (ref 78.0–100.0)
MONO ABS: 0.3 10*3/uL (ref 0.1–1.0)
Monocytes Relative: 6 %
Neutro Abs: 2.7 10*3/uL (ref 1.7–7.7)
Neutrophils Relative %: 47 %
PLATELETS: 383 10*3/uL (ref 150–400)
RBC: 4.06 MIL/uL (ref 3.87–5.11)
RDW: 18.7 % — AB (ref 11.5–15.5)
WBC: 5.8 10*3/uL (ref 4.0–10.5)

## 2017-08-26 LAB — URINALYSIS, ROUTINE W REFLEX MICROSCOPIC
Bilirubin Urine: NEGATIVE
Glucose, UA: NEGATIVE mg/dL
Ketones, ur: NEGATIVE mg/dL
Nitrite: NEGATIVE
Protein, ur: NEGATIVE mg/dL
SPECIFIC GRAVITY, URINE: 1.005 (ref 1.005–1.030)
pH: 6 (ref 5.0–8.0)

## 2017-08-26 LAB — PREGNANCY, URINE: PREG TEST UR: NEGATIVE

## 2017-08-26 LAB — COMPREHENSIVE METABOLIC PANEL
ALT: 9 U/L (ref 0–44)
AST: 14 U/L — ABNORMAL LOW (ref 15–41)
Albumin: 3.6 g/dL (ref 3.5–5.0)
Alkaline Phosphatase: 101 U/L (ref 38–126)
Anion gap: 9 (ref 5–15)
BILIRUBIN TOTAL: 0.3 mg/dL (ref 0.3–1.2)
BUN: 9 mg/dL (ref 6–20)
CHLORIDE: 106 mmol/L (ref 98–111)
CO2: 24 mmol/L (ref 22–32)
CREATININE: 0.56 mg/dL (ref 0.44–1.00)
Calcium: 8.6 mg/dL — ABNORMAL LOW (ref 8.9–10.3)
GFR calc Af Amer: >60 mL/min (ref >60)
Glucose, Bld: 109 mg/dL — ABNORMAL HIGH (ref 70–99)
Potassium: 2.9 mmol/L — ABNORMAL LOW (ref 3.5–5.1)
Sodium: 139 mmol/L (ref 135–145)
TOTAL PROTEIN: 7.7 g/dL (ref 6.5–8.1)

## 2017-08-26 LAB — LIPASE, BLOOD: LIPASE: 40 U/L (ref 11–51)

## 2017-08-26 MED ORDER — SODIUM CHLORIDE 0.9 % IV BOLUS
1000.0000 mL | Freq: Once | INTRAVENOUS | Status: AC
  Administered 2017-08-26: 1000 mL via INTRAVENOUS

## 2017-08-26 MED ORDER — MORPHINE SULFATE (PF) 4 MG/ML IV SOLN
4.0000 mg | Freq: Once | INTRAVENOUS | Status: AC
  Administered 2017-08-26: 4 mg via INTRAVENOUS
  Filled 2017-08-26: qty 1

## 2017-08-26 MED ORDER — IOPAMIDOL (ISOVUE-300) INJECTION 61%
100.0000 mL | Freq: Once | INTRAVENOUS | Status: AC | PRN
  Administered 2017-08-26: 100 mL via INTRAVENOUS

## 2017-08-26 NOTE — ED Triage Notes (Signed)
Pt c/o abd pain x 3 days. She describes pain as burning and shooting.

## 2017-08-27 MED ORDER — PANTOPRAZOLE SODIUM 40 MG IV SOLR
40.0000 mg | Freq: Once | INTRAVENOUS | Status: AC
  Administered 2017-08-27: 40 mg via INTRAVENOUS
  Filled 2017-08-27: qty 40

## 2017-08-27 MED ORDER — POTASSIUM CHLORIDE CRYS ER 20 MEQ PO TBCR
40.0000 meq | EXTENDED_RELEASE_TABLET | Freq: Once | ORAL | Status: AC
  Administered 2017-08-27: 40 meq via ORAL
  Filled 2017-08-27: qty 2

## 2017-08-27 MED ORDER — GI COCKTAIL ~~LOC~~
30.0000 mL | Freq: Once | ORAL | Status: AC
  Administered 2017-08-27: 30 mL via ORAL
  Filled 2017-08-27: qty 30

## 2017-08-27 NOTE — Discharge Instructions (Addendum)
Avoid excessive aspirin use.  Continue taking your Prilosec daily.  Call your primary provider to arrange a follow-up appt.  Return to ER for any worsening symptoms such as fever, vomiting or increasing pain

## 2017-08-27 NOTE — ED Provider Notes (Signed)
[facility] EMERGENCY DEPARTMENT Provider Note   CSN: [id number] Arrival date & time: 08/26/17  2145     History   Chief Complaint Chief Complaint  Patient presents with  . Abdominal Pain    HPI [name] is a 52 y.o. female.  HPI   [name] is a 52 y.o. female who presents to the Emergency Department complaining of upper abdominal pain for 3 days.  She describes the pain as a burning sensation and shooting across her upper abdomen.  Pain is not been associated with food intake.  She also complains of feeling "bloated" in her abdomen and feels that her abdomen is swollen.  No nausea or vomiting.  She denies chest pain or shortness of breath.  She takes omeprazole daily, but states this does not help.  She also reports a few episodes of loose stools that she states has been dark.  She does take iron supplements daily.  She denies fever, vomiting back pain or dysuria.  Nothing makes her symptoms better or worse.   Past Medical History:  Diagnosis Date  . Arthritis, rheumatoid (HCC)   . Chronic anemia   . Chronic pain   . COPD (chronic obstructive pulmonary disease) (HCC)   . Depression   . Hypertension   . Incidental lung nodule   . Osteoporosis   . Rheumatoid arthritis(714.0)   . Trichimoniasis     Patient Active Problem List   Diagnosis Date Noted  . Trichomonas vaginitis 04/23/2016  . HSIL (high grade squamous intraepithelial lesion) on Pap smear of cervix 04/23/2016  . Hypokalemia 05/05/2015  . Absolute anemia 05/05/2015  . Anxiety disorder 05/05/2015  . Fracture, cuboid 03/05/2015  . Pathological fracture of pelvis 03/05/2015  . Pathological fracture of pelvis due to osteoporosis (HCC) 02/20/2015  . Osteoporosis 02/20/2015  . Chronic pain 02/04/2015  . Esophageal reflux 02/04/2015  . Subclinical hyperthyroidism 02/04/2015  . Gastritis 01/07/2015  . Rheumatoid arthritis (HCC) 11/19/2014  . Cigarette nicotine dependence, uncomplicated 11/14/2014  .  Hyperlipidemia 11/14/2014    Past Surgical History:  Procedure Laterality Date  . BREAST CYST EXCISION  12/31/2010   Procedure: CYST EXCISION BREAST;  Surgeon: Fabio Bering, MD;  Location: [facility];  Service: General;  Laterality: Right;  Excision Sebaceous Cyst Right Breast  . ENDOMETRIAL ABLATION    . TUBAL LIGATION       OB History    Gravida  6   Para  5   Term  5   Preterm      AB  1   Living  5     SAB  1   TAB      Ectopic      Multiple      Live Births               Home Medications    Prior to Admission medications   Medication Sig Start Date End Date Taking? Authorizing Provider  alendronate (FOSAMAX) 70 MG tablet Take 70 mg by mouth every Monday. Take with a full glass of water on an empty stomach.(Monday)   Yes [provider]  Aspirin-Salicylamide-Caffeine (BC FAST PAIN RELIEF ARTHRITIS) 101-751-02 MG PACK Take 1-2 Packages by mouth daily as needed (for pain).   Yes [provider]  atorvastatin (LIPITOR) 20 MG tablet Take 1 tablet (20 mg total) by mouth daily. 02/18/16  Yes Jacquelin Hawking, PA-C  ferrous sulfate 325 (65 FE) MG tablet Take 325 mg by mouth daily with breakfast.  Yes [provider]  folic acid (FOLVITE) 1 MG tablet Take 1 mg by mouth daily.   Yes [provider]  methotrexate (RHEUMATREX) 2.5 MG tablet Take 25 mg by mouth every Monday. Caution:Chemotherapy. Protect from light.(Thursday)   Yes [provider]  Multiple Vitamin (MULTIVITAMIN WITH MINERALS) TABS Take 1 tablet by mouth daily. Reported on 05/02/2015   Yes [provider]  naproxen (NAPROSYN) 500 MG tablet Take 500 mg by mouth 2 (two) times daily with a meal.   Yes [provider]  omeprazole (PRILOSEC) 40 MG capsule Take 1 capsule (40 mg total) by mouth daily. 02/18/16  Yes Jacquelin Hawking, PA-C    Family History Family History  Problem Relation Age of Onset  . Cancer Father        lung  . Hypertension  Mother   . Hypertension Daughter   . Anesthesia problems Neg Hx     Social History Social History   Tobacco Use  . Smoking status: Current Every Day Smoker    Packs/day: 1.00    Years: 30.00    Pack years: 30.00    Types: Cigarettes  . Smokeless tobacco: Never Used  Substance Use Topics  . Alcohol use: No    Alcohol/week: 0.0 standard drinks  . Drug use: No     Allergies   Voltaren [diclofenac sodium] and Codeine   Review of Systems Review of Systems  Constitutional: Negative for appetite change, chills and fever.  HENT: Negative for sore throat and trouble swallowing.   Respiratory: Negative for chest tightness and shortness of breath.   Cardiovascular: Negative for chest pain.  Gastrointestinal: Positive for abdominal distention, abdominal pain and diarrhea. Negative for blood in stool, nausea and vomiting.  Genitourinary: Negative for decreased urine volume, difficulty urinating, dysuria and flank pain.  Musculoskeletal: Negative for back pain and neck pain.  Skin: Negative for color change and rash.  Neurological: Negative for dizziness, weakness and numbness.  Hematological: Negative for adenopathy.     Physical Exam Updated Vital Signs BP (!) 114/101   Pulse 80   Temp 98.1 F (36.7 C) (Oral)   Resp 18   Ht 5\' 5"  (1.651 m)   Wt 65.8 kg   SpO2 100%   BMI 24.13 kg/m   Physical Exam  Constitutional: She appears well-developed and well-nourished. No distress.  HENT:  Head: Normocephalic and atraumatic.  Mouth/Throat: Oropharynx is clear and moist.  Neck: Normal range of motion. Neck supple.  Cardiovascular: Normal rate, regular rhythm and intact distal pulses.  No murmur heard. Pulmonary/Chest: Effort normal and breath sounds normal. No respiratory distress. She exhibits no tenderness.  Abdominal: Soft. She exhibits no distension, no ascites and no mass. Bowel sounds are decreased. There is no hepatosplenomegaly. There is tenderness in the epigastric  area. There is no rigidity, no rebound, no guarding, no CVA tenderness and no tenderness at McBurney's point.  Genitourinary:  Genitourinary Comments: Digital rectal exam performed by me.  Dark brown stool present, heme-negative.  No palpable rectal masses or tenderness   Musculoskeletal: Normal range of motion. She exhibits no edema.  Neurological: She is alert. No sensory deficit. She exhibits normal muscle tone. Coordination normal.  Skin: Skin is warm and dry. Capillary refill takes less than 2 seconds.  Psychiatric: She has a normal mood and affect.  Nursing note and vitals reviewed.    ED Treatments / Results  Labs (all labs ordered are listed, but only abnormal results are displayed) Labs Reviewed  CBC  WITH DIFFERENTIAL/PLATELET - Abnormal; Notable for the following components:      Result Value   Hemoglobin 10.1 (*)    HCT 32.4 (*)    MCH 24.9 (*)    RDW 18.7 (*)    All other components within normal limits  COMPREHENSIVE METABOLIC PANEL - Abnormal; Notable for the following components:   Potassium 2.9 (*)    Glucose, Bld 109 (*)    Calcium 8.6 (*)    AST 14 (*)    All other components within normal limits  URINALYSIS, ROUTINE W REFLEX MICROSCOPIC - Abnormal; Notable for the following components:   Color, Urine STRAW (*)    Hgb urine dipstick MODERATE (*)    Leukocytes, UA TRACE (*)    Bacteria, UA RARE (*)    All other components within normal limits  LIPASE, BLOOD  PREGNANCY, URINE  POC OCCULT BLOOD, ED    EKG None  Radiology Dg Chest 2 View  Result Date: 08/26/2017 CLINICAL DATA:  Short of breath EXAM: CHEST - 2 VIEW COMPARISON:  12/23/2014 FINDINGS: Mild diffuse interstitial opacity most likely chronic change. No acute consolidation or effusion. Normal cardiomediastinal silhouette. No pneumothorax. IMPRESSION: No active cardiopulmonary disease. Electronically Signed   By: Jasmine Pang M.D.   On: 08/26/2017 23:47   Ct Abdomen Pelvis W Contrast  Result  Date: 08/27/2017 CLINICAL DATA:  Abdominal pain for several days EXAM: CT ABDOMEN AND PELVIS WITH CONTRAST TECHNIQUE: Multidetector CT imaging of the abdomen and pelvis was performed using the standard protocol following bolus administration of intravenous contrast. CONTRAST:  ISOVUE-300 IOPAMIDOL (ISOVUE-300) INJECTION 61% COMPARISON:  02/07/2015 FINDINGS: Lower chest: No acute abnormality. Hepatobiliary: Mild fatty infiltration of the liver is noted. No mass is noted. The gallbladder is decompressed. Pancreas: Unremarkable. No pancreatic ductal dilatation or surrounding inflammatory changes. Spleen: Normal in size without focal abnormality. Adrenals/Urinary Tract: Adrenal glands are within normal limits. The kidneys are well visualized bilaterally. No renal calculi or obstructive changes are noted. Stomach/Bowel: Scattered diverticular change of the colon is noted without diverticulitis. No obstructive or inflammatory changes are seen. The appendix is within normal limits. Small bowel is unremarkable. Vascular/Lymphatic: Aortic atherosclerosis. No enlarged abdominal or pelvic lymph nodes. Reproductive: Uterus and bilateral adnexa are unremarkable. Other: No abdominal wall hernia or abnormality. No abdominopelvic ascites. Musculoskeletal: Old right inferior pubic ramus fracture with healing is noted. Previously seen sacral insufficiency fracture is healed in the interval. Degenerative changes of the lumbar spine are noted. IMPRESSION: Chronic changes as described above without acute abnormality. Electronically Signed   By: Alcide Clever M.D.   On: 08/27/2017 00:01    Procedures Procedures (including critical care time)  Medications Ordered in ED Medications  pantoprazole (PROTONIX) injection 40 mg (has no administration in time range)  gi cocktail (Maalox,Lidocaine,Donnatal) (has no administration in time range)  potassium chloride SA (K-DUR,KLOR-CON) CR tablet 40 mEq (has no administration in time  range)  sodium chloride 0.9 % bolus 1,000 mL (0 mLs Intravenous Stopped 08/26/17 2311)  morphine 4 MG/ML injection 4 mg (4 mg Intravenous Given 08/26/17 2258)  iopamidol (ISOVUE-300) 61 % injection 100 mL (100 mLs Intravenous Contrast Given 08/26/17 2344)     Initial Impression / Assessment and Plan / ED Course  I have reviewed the triage vital signs and the nursing notes.  Pertinent labs & imaging results that were available during my care of the patient were reviewed by me and considered in my medical decision making (see chart for details).  Patient with epigastric pain, nontoxic-appearing.  No nausea or vomiting or fever.  CT abdomen and pelvis reassuring, normal-appearing appendix..  Pain improved after Protonix and GI cocktail.  Discussed with patient importance of close follow-up with her PCP as she may need a gallbladder ultrasound if symptoms are not improving.  Patient verbalized understanding of this and agrees to plan.  Strict return precautions were discussed.  Final Clinical Impressions(s) / ED Diagnoses   Final diagnoses:  Epigastric abdominal pain    ED Discharge Orders    None       Rosey Bath 08/28/17 1850    Bethann Berkshire, MD 08/31/17 1221

## 2017-08-27 NOTE — ED Notes (Signed)
Hemoccult negative. Exam completed by Pauline Aus PA

## 2017-09-02 ENCOUNTER — Encounter (HOSPITAL_COMMUNITY): Payer: Self-pay | Admitting: Emergency Medicine

## 2017-09-02 ENCOUNTER — Emergency Department (HOSPITAL_COMMUNITY): Payer: Self-pay

## 2017-09-02 ENCOUNTER — Other Ambulatory Visit: Payer: Self-pay

## 2017-09-02 ENCOUNTER — Emergency Department (HOSPITAL_COMMUNITY)
Admission: EM | Admit: 2017-09-02 | Discharge: 2017-09-02 | Disposition: A | Discharge status: Home / Self Care | Payer: Self-pay | Attending: Emergency Medicine | Admitting: Emergency Medicine

## 2017-09-02 DIAGNOSIS — I10 Essential (primary) hypertension: Secondary | ICD-10-CM | POA: Insufficient documentation

## 2017-09-02 DIAGNOSIS — J449 Chronic obstructive pulmonary disease, unspecified: Secondary | ICD-10-CM | POA: Insufficient documentation

## 2017-09-02 DIAGNOSIS — F17210 Nicotine dependence, cigarettes, uncomplicated: Secondary | ICD-10-CM | POA: Insufficient documentation

## 2017-09-02 DIAGNOSIS — R112 Nausea with vomiting, unspecified: Secondary | ICD-10-CM | POA: Insufficient documentation

## 2017-09-02 DIAGNOSIS — R634 Abnormal weight loss: Secondary | ICD-10-CM | POA: Insufficient documentation

## 2017-09-02 DIAGNOSIS — R109 Unspecified abdominal pain: Secondary | ICD-10-CM | POA: Insufficient documentation

## 2017-09-02 DIAGNOSIS — Z79899 Other long term (current) drug therapy: Secondary | ICD-10-CM | POA: Insufficient documentation

## 2017-09-02 LAB — URINALYSIS, ROUTINE W REFLEX MICROSCOPIC
BACTERIA UA: NONE SEEN
BILIRUBIN URINE: NEGATIVE
Glucose, UA: NEGATIVE mg/dL
Ketones, ur: NEGATIVE mg/dL
Nitrite: NEGATIVE
PROTEIN: NEGATIVE mg/dL
Specific Gravity, Urine: 1.003 — ABNORMAL LOW (ref 1.005–1.030)
pH: 6 (ref 5.0–8.0)

## 2017-09-02 LAB — CBC WITH DIFFERENTIAL/PLATELET
Basophils Absolute: 0 10*3/uL (ref 0.0–0.1)
Basophils Relative: 1 %
EOS ABS: 0.3 10*3/uL (ref 0.0–0.7)
EOS PCT: 5 %
HCT: 31.8 % — ABNORMAL LOW (ref 36.0–46.0)
HEMOGLOBIN: 10.2 g/dL — AB (ref 12.0–15.0)
Lymphocytes Relative: 31 %
Lymphs Abs: 1.8 10*3/uL (ref 0.7–4.0)
MCH: 25.2 pg — ABNORMAL LOW (ref 26.0–34.0)
MCHC: 32.1 g/dL (ref 30.0–36.0)
MCV: 78.5 fL (ref 78.0–100.0)
Monocytes Absolute: 0.4 10*3/uL (ref 0.1–1.0)
Monocytes Relative: 6 %
NEUTROS PCT: 57 %
Neutro Abs: 3.4 10*3/uL (ref 1.7–7.7)
PLATELETS: 418 10*3/uL — AB (ref 150–400)
RBC: 4.05 MIL/uL (ref 3.87–5.11)
RDW: 18.6 % — ABNORMAL HIGH (ref 11.5–15.5)
WBC: 5.8 10*3/uL (ref 4.0–10.5)

## 2017-09-02 LAB — COMPREHENSIVE METABOLIC PANEL
ALT: 11 U/L (ref 0–44)
AST: 16 U/L (ref 15–41)
Albumin: 3.7 g/dL (ref 3.5–5.0)
Alkaline Phosphatase: 88 U/L (ref 38–126)
Anion gap: 7 (ref 5–15)
BUN: 8 mg/dL (ref 6–20)
CHLORIDE: 107 mmol/L (ref 98–111)
CO2: 26 mmol/L (ref 22–32)
CREATININE: 0.45 mg/dL (ref 0.44–1.00)
Calcium: 8.8 mg/dL — ABNORMAL LOW (ref 8.9–10.3)
GFR calc Af Amer: >60 mL/min (ref >60)
GFR calc non Af Amer: >60 mL/min (ref >60)
Glucose, Bld: 73 mg/dL (ref 70–99)
Potassium: 3.4 mmol/L — ABNORMAL LOW (ref 3.5–5.1)
SODIUM: 140 mmol/L (ref 135–145)
Total Bilirubin: 0.2 mg/dL — ABNORMAL LOW (ref 0.3–1.2)
Total Protein: 7.5 g/dL (ref 6.5–8.1)

## 2017-09-02 LAB — LIPASE, BLOOD: Lipase: 40 U/L (ref 11–51)

## 2017-09-02 MED ORDER — ONDANSETRON HCL 4 MG/2ML IJ SOLN
4.0000 mg | Freq: Once | INTRAMUSCULAR | Status: AC
  Administered 2017-09-02: 4 mg via INTRAVENOUS
  Filled 2017-09-02: qty 2

## 2017-09-02 MED ORDER — ONDANSETRON HCL 8 MG PO TABS: 8.0000 mg | ORAL_TABLET | ORAL | 0 refills | Status: DC | PRN

## 2017-09-02 MED ORDER — KETOROLAC TROMETHAMINE 10 MG PO TABS: 10.0000 mg | ORAL_TABLET | Freq: Four times a day (QID) | ORAL | 0 refills | Status: DC | PRN

## 2017-09-02 MED ORDER — KETOROLAC TROMETHAMINE 30 MG/ML IJ SOLN
30.0000 mg | Freq: Once | INTRAMUSCULAR | Status: AC
  Administered 2017-09-02: 30 mg via INTRAVENOUS
  Filled 2017-09-02: qty 1

## 2017-09-02 MED ORDER — SODIUM CHLORIDE 0.9 % IV BOLUS
1000.0000 mL | Freq: Once | INTRAVENOUS | Status: AC
  Administered 2017-09-02: 1000 mL via INTRAVENOUS

## 2017-09-02 NOTE — ED Provider Notes (Signed)
[facility] EMERGENCY DEPARTMENT Provider Note   CSN: [id number] Arrival date & time: 09/02/17  1409     History   Chief Complaint No chief complaint on file.   HPI [name] is a 52 y.o. female.  Patient presents with right-sided abdominal pain with associated green bilious vomiting for several days.  Decreased oral intake.  CT scan of abdomen pelvis on 08/26/2017 showed no acute pathology.  She claims to have lost 12 pounds in the last week.  No known history of gallbladder disease.  States her abdomen is "bloated".  Severity of symptoms is mild to moderate.  Nothing makes symptoms better or worse     Past Medical History:  Diagnosis Date  . Arthritis, rheumatoid (HCC)   . Chronic anemia   . Chronic pain   . COPD (chronic obstructive pulmonary disease) (HCC)   . Depression   . Hypertension   . Incidental lung nodule   . Osteoporosis   . Rheumatoid arthritis(714.0)   . Trichimoniasis     Patient Active Problem List   Diagnosis Date Noted  . Trichomonas vaginitis 04/23/2016  . HSIL (high grade squamous intraepithelial lesion) on Pap smear of cervix 04/23/2016  . Hypokalemia 05/05/2015  . Absolute anemia 05/05/2015  . Anxiety disorder 05/05/2015  . Fracture, cuboid 03/05/2015  . Pathological fracture of pelvis 03/05/2015  . Pathological fracture of pelvis due to osteoporosis (HCC) 02/20/2015  . Osteoporosis 02/20/2015  . Chronic pain 02/04/2015  . Esophageal reflux 02/04/2015  . Subclinical hyperthyroidism 02/04/2015  . Gastritis 01/07/2015  . Rheumatoid arthritis (HCC) 11/19/2014  . Cigarette nicotine dependence, uncomplicated 11/14/2014  . Hyperlipidemia 11/14/2014    Past Surgical History:  Procedure Laterality Date  . BREAST CYST EXCISION  12/31/2010   Procedure: CYST EXCISION BREAST;  Surgeon: Fabio Bering, MD;  Location: [facility];  Service: General;  Laterality: Right;  Excision Sebaceous Cyst Right Breast  . ENDOMETRIAL ABLATION    . TUBAL  LIGATION       OB History    Gravida  6   Para  5   Term  5   Preterm      AB  1   Living  5     SAB  1   TAB      Ectopic      Multiple      Live Births               Home Medications    Prior to Admission medications   Medication Sig Start Date End Date Taking? Authorizing Provider  alendronate (FOSAMAX) 70 MG tablet Take 70 mg by mouth every Monday. Take with a full glass of water on an empty stomach.(Monday)   Yes [provider]  Aspirin-Salicylamide-Caffeine (BC FAST PAIN RELIEF ARTHRITIS) 884-166-06 MG PACK Take 1-2 Packages by mouth daily as needed (for pain).   Yes [provider]  atorvastatin (LIPITOR) 20 MG tablet Take 1 tablet (20 mg total) by mouth daily. 02/18/16  Yes Jacquelin Hawking, PA-C  ferrous sulfate 325 (65 FE) MG tablet Take 325 mg by mouth daily with breakfast.   Yes [provider]  folic acid (FOLVITE) 1 MG tablet Take 1 mg by mouth daily.   Yes [provider]  Multiple Vitamin (MULTIVITAMIN WITH MINERALS) TABS Take 1 tablet by mouth daily. Reported on 05/02/2015   Yes [provider]  naproxen (NAPROSYN) 500 MG tablet Take 500 mg by mouth 2 (two) times daily with a meal.  Yes [provider]  omeprazole (PRILOSEC) 40 MG capsule Take 1 capsule (40 mg total) by mouth daily. 02/18/16  Yes Jacquelin Hawking, PA-C  ketorolac (TORADOL) 10 MG tablet Take 1 tablet (10 mg total) by mouth every 6 (six) hours as needed. 09/02/17   Donnetta Hutching, MD  methotrexate (RHEUMATREX) 2.5 MG tablet Take 25 mg by mouth every Monday. Caution:Chemotherapy. Protect from light.(Thursday)    [provider]  ondansetron (ZOFRAN) 8 MG tablet Take 1 tablet (8 mg total) by mouth every 4 (four) hours as needed. 09/02/17   Donnetta Hutching, MD    Family History Family History  Problem Relation Age of Onset  . Cancer Father        lung  . Hypertension Mother   . Hypertension Daughter   . Anesthesia problems Neg Hx       Social History Social History   Tobacco Use  . Smoking status: Current Every Day Smoker    Packs/day: 1.00    Years: 30.00    Pack years: 30.00    Types: Cigarettes  . Smokeless tobacco: Never Used  Substance Use Topics  . Alcohol use: No    Alcohol/week: 0.0 standard drinks  . Drug use: No     Allergies   Voltaren [diclofenac sodium] and Codeine   Review of Systems Review of Systems  All other systems reviewed and are negative.    Physical Exam Updated Vital Signs BP (!) 155/88   Pulse 61   Temp 99.1 F (37.3 C) (Oral)   Resp 17   Ht 5\' 5"  (1.651 m)   Wt 60.3 kg   SpO2 98%   BMI 22.13 kg/m   Physical Exam  Constitutional: She is oriented to person, place, and time. She appears well-developed and well-nourished.  HENT:  Head: Normocephalic and atraumatic.  Eyes: Conjunctivae are normal.  Neck: Neck supple.  Cardiovascular: Normal rate and regular rhythm.  Pulmonary/Chest: Effort normal and breath sounds normal.  Abdominal:  Minimal right mid abdominal tenderness  Musculoskeletal: Normal range of motion.  Neurological: She is alert and oriented to person, place, and time.  Skin: Skin is warm and dry.  Psychiatric: She has a normal mood and affect. Her behavior is normal.  Nursing note and vitals reviewed.    ED Treatments / Results  Labs (all labs ordered are listed, but only abnormal results are displayed) Labs Reviewed  URINALYSIS, ROUTINE W REFLEX MICROSCOPIC - Abnormal; Notable for the following components:      Result Value   Color, Urine STRAW (*)    Specific Gravity, Urine 1.003 (*)    Hgb urine dipstick MODERATE (*)    Leukocytes, UA TRACE (*)    All other components within normal limits  CBC WITH DIFFERENTIAL/PLATELET - Abnormal; Notable for the following components:   Hemoglobin 10.2 (*)    HCT 31.8 (*)    MCH 25.2 (*)    RDW 18.6 (*)    Platelets 418 (*)    All other components within normal limits  COMPREHENSIVE METABOLIC  PANEL - Abnormal; Notable for the following components:   Potassium 3.4 (*)    Calcium 8.8 (*)    Total Bilirubin 0.2 (*)    All other components within normal limits  LIPASE, BLOOD    EKG None  Radiology US Abdomen Limited  Result Date: 09/02/2017 CLINICAL DATA:  Initial evaluation for acute right upper quadrant pain with nausea and vomiting. EXAM: ULTRASOUND ABDOMEN LIMITED RIGHT UPPER QUADRANT COMPARISON:  Prior CT  from 08/26/2017. FINDINGS: Gallbladder: No stones or sludge within the gallbladder lumen. Gallbladder wall mildly thickened up to 4 mm. No free pericholecystic fluid. Positive sonographic Eulah Pont sign was elicited on exam. Common bile duct: Diameter: 5 mm Liver: No focal lesion identified. Diffusely increased echogenicity within the renal parenchyma. Portal vein is patent on color Doppler imaging with normal direction of blood flow towards the liver. IMPRESSION: 1. Mild gallbladder wall thickening with positive sonographic Murphy sign without internal stones or sludge. Clinical correlation for possible acalculous cholecystitis recommended. 2. No biliary dilatation. 3. Hepatic steatosis. Electronically Signed   By: Rise Mu M.D.   On: 09/02/2017 16:50    Procedures Procedures (including critical care time)  Medications Ordered in ED Medications  sodium chloride 0.9 % bolus 1,000 mL (0 mLs Intravenous Stopped 09/02/17 1858)  ondansetron (ZOFRAN) injection 4 mg (4 mg Intravenous Given 09/02/17 1703)  ketorolac (TORADOL) 30 MG/ML injection 30 mg (30 mg Intravenous Given 09/02/17 1703)  sodium chloride 0.9 % bolus 1,000 mL (0 mLs Intravenous Stopped 09/02/17 1858)     Initial Impression / Assessment and Plan / ED Course  I have reviewed the triage vital signs and the nursing notes.  Pertinent labs & imaging results that were available during my care of the patient were reviewed by me and considered in my medical decision making (see chart for details).     Patient  presents with right-sided abdominal pain.  No acute abdomen on physical exam.  White count and liver functions normal.  Ultrasound gallbladder reveals no obvious cholelithiasis or cholecystitis.  She feels better after IV fluids, IV Toradol, IV Zofran.  Discharge medications oral Toradol 10 mg and Zofran 8 mg  Final Clinical Impressions(s) / ED Diagnoses   Final diagnoses:  Abdominal pain, unspecified abdominal location    ED Discharge Orders         Ordered    ketorolac (TORADOL) 10 MG tablet  Every 6 hours PRN     09/02/17 1910    ondansetron (ZOFRAN) 8 MG tablet  Every 4 hours PRN     09/02/17 1910           Donnetta Hutching, MD 09/02/17 1916

## 2017-09-02 NOTE — Discharge Instructions (Addendum)
Tests showed no life-threatening condition.  Ultrasound revealed no obvious gallstones.  Medication for pain and nausea.  Avoid rich greasy foods.  Follow-up with your primary care doctor.  If you are taking Naprosyn or naproxen, recommend stopping this medicine

## 2017-09-02 NOTE — ED Triage Notes (Signed)
abd pain N/V since Sunday.  Has not been able to hold anything down.

## 2017-09-16 ENCOUNTER — Encounter (HOSPITAL_COMMUNITY): Payer: Self-pay | Admitting: Emergency Medicine

## 2017-09-16 ENCOUNTER — Emergency Department (HOSPITAL_COMMUNITY): Payer: Self-pay

## 2017-09-16 ENCOUNTER — Emergency Department (HOSPITAL_COMMUNITY)
Admission: EM | Admit: 2017-09-16 | Discharge: 2017-09-16 | Disposition: A | Discharge status: Home / Self Care | Payer: Self-pay | Attending: Emergency Medicine | Admitting: Emergency Medicine

## 2017-09-16 ENCOUNTER — Other Ambulatory Visit: Payer: Self-pay

## 2017-09-16 DIAGNOSIS — M542 Cervicalgia: Secondary | ICD-10-CM | POA: Insufficient documentation

## 2017-09-16 DIAGNOSIS — J449 Chronic obstructive pulmonary disease, unspecified: Secondary | ICD-10-CM | POA: Insufficient documentation

## 2017-09-16 DIAGNOSIS — I10 Essential (primary) hypertension: Secondary | ICD-10-CM | POA: Insufficient documentation

## 2017-09-16 DIAGNOSIS — R52 Pain, unspecified: Secondary | ICD-10-CM

## 2017-09-16 DIAGNOSIS — Z79899 Other long term (current) drug therapy: Secondary | ICD-10-CM | POA: Insufficient documentation

## 2017-09-16 DIAGNOSIS — F17210 Nicotine dependence, cigarettes, uncomplicated: Secondary | ICD-10-CM | POA: Insufficient documentation

## 2017-09-16 DIAGNOSIS — M25512 Pain in left shoulder: Secondary | ICD-10-CM | POA: Insufficient documentation

## 2017-09-16 DIAGNOSIS — M25551 Pain in right hip: Secondary | ICD-10-CM | POA: Insufficient documentation

## 2017-09-16 DIAGNOSIS — T7421XA Adult sexual abuse, confirmed, initial encounter: Secondary | ICD-10-CM | POA: Insufficient documentation

## 2017-09-16 LAB — COMPREHENSIVE METABOLIC PANEL
ALK PHOS: 90 U/L (ref 38–126)
ALT: 13 U/L (ref 0–44)
AST: 19 U/L (ref 15–41)
Albumin: 3.3 g/dL — ABNORMAL LOW (ref 3.5–5.0)
Anion gap: 12 (ref 5–15)
BILIRUBIN TOTAL: 1.1 mg/dL (ref 0.3–1.2)
BUN: 13 mg/dL (ref 6–20)
CHLORIDE: 101 mmol/L (ref 98–111)
CO2: 21 mmol/L — ABNORMAL LOW (ref 22–32)
CREATININE: 0.59 mg/dL (ref 0.44–1.00)
Calcium: 8.7 mg/dL — ABNORMAL LOW (ref 8.9–10.3)
GFR calc Af Amer: >60 mL/min (ref >60)
GFR calc non Af Amer: >60 mL/min (ref >60)
Glucose, Bld: 72 mg/dL (ref 70–99)
Potassium: 3.3 mmol/L — ABNORMAL LOW (ref 3.5–5.1)
Sodium: 134 mmol/L — ABNORMAL LOW (ref 135–145)
Total Protein: 7.5 g/dL (ref 6.5–8.1)

## 2017-09-16 LAB — RAPID HIV SCREEN (HIV 1/2 AB+AG)
HIV 1/2 Antibodies: NONREACTIVE
HIV-1 P24 Antigen - HIV24: NONREACTIVE

## 2017-09-16 MED ORDER — LIDOCAINE HCL (PF) 1 % IJ SOLN
0.9000 mL | Freq: Once | INTRAMUSCULAR | Status: AC
  Administered 2017-09-16: 0.9 mL

## 2017-09-16 MED ORDER — ELVITEG-COBIC-EMTRICIT-TENOFAF 150-150-200-10 MG PREPACK
5.0000 | ORAL_TABLET | Freq: Once | ORAL | Status: AC
  Administered 2017-09-16: 1 via ORAL
  Filled 2017-09-16: qty 5

## 2017-09-16 MED ORDER — HEPATITIS B VAC RECOMBINANT 10 MCG/ML IJ SUSP
1.0000 mL | Freq: Once | INTRAMUSCULAR | Status: DC
  Filled 2017-09-16: qty 1

## 2017-09-16 MED ORDER — PROMETHAZINE HCL 12.5 MG PO TABS: 25.0000 mg | ORAL_TABLET | Freq: Four times a day (QID) | ORAL | Status: DC | PRN

## 2017-09-16 MED ORDER — METRONIDAZOLE 500 MG PO TABS
2000.0000 mg | ORAL_TABLET | Freq: Once | ORAL | Status: AC
  Administered 2017-09-16: 2000 mg via ORAL

## 2017-09-16 MED ORDER — CEFTRIAXONE SODIUM 250 MG IJ SOLR
250.0000 mg | Freq: Once | INTRAMUSCULAR | Status: AC
  Administered 2017-09-16: 250 mg via INTRAMUSCULAR

## 2017-09-16 MED ORDER — ELVITEG-COBIC-EMTRICIT-TENOFAF 150-150-200-10 MG PO TABS: 1.0000 | ORAL_TABLET | Freq: Every day | ORAL | 0 refills | Status: DC

## 2017-09-16 MED ORDER — AZITHROMYCIN 250 MG PO TABS
1000.0000 mg | ORAL_TABLET | Freq: Once | ORAL | Status: AC
  Administered 2017-09-16: 1000 mg via ORAL

## 2017-09-16 MED ORDER — ACETAMINOPHEN 500 MG PO TABS
1000.0000 mg | ORAL_TABLET | Freq: Once | ORAL | Status: AC
  Administered 2017-09-16: 1000 mg via ORAL
  Filled 2017-09-16: qty 2

## 2017-09-16 NOTE — Discharge Instructions (Signed)
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 injected into a muscle or infused it into a vein. It is usually given in a medical office or clinic. If you are to give this medicine you will be taught how to inject it. Follow instructions carefully. Use your doses at regular intervals. Do not take your medicine more often than directed. Do not skip doses or stop your medicine early even if you feel better. Do not stop taking except on your doctor's advice. Talk to your pediatrician regarding the use of this medicine in children. Special care may be needed. Overdosage: If you think you have taken too much of this medicine contact a poison control center or emergency room at once. NOTE: This medicine is only for you. Do not share this medicine with others. What if I miss a dose? If you miss a dose, take it as soon as you can. If it  is almost time for your  next dose, take only that dose. Do not take double or extra doses. What may interact with this medicine? Do not take this medicine with any of the following medications: -intravenous calcium This medicine may also interact with the following medications: -birth control pills This list may not describe all possible interactions. Give your health care provider a list of all the medicines, herbs, non-prescription drugs, or dietary supplements you use. Also tell them if you smoke, drink alcohol, or use illegal drugs. Some items may interact with your medicine. What should I watch for while using this medicine? Tell your doctor or health care professional if your symptoms do not improve or if they get worse. Do not treat diarrhea with over the counter products. Contact your doctor if you have diarrhea that lasts more than 2 days or if it is severe and watery. If you are being treated for a sexually transmitted disease, avoid sexual contact until you have finished your treatment. Having sex can infect your sexual partner. Calcium may bind to this medicine and cause lung or kidney problems. Avoid calcium products while taking this medicine and for 48 hours after taking the last dose of this medicine. What side effects may I notice from receiving this medicine? Side effects that you should report to your doctor or health care professional as soon as possible: -allergic reactions like skin rash, itching or hives, swelling of the face, lips, or tongue -breathing problems -fever, chills -irregular heartbeat -pain when passing urine -seizures -stomach pain, cramps -unusual bleeding, bruising -unusually weak or tired Side effects that usually do not require medical attention (report to your doctor or health care professional if they continue or are bothersome): -diarrhea -dizzy, drowsy -headache -nausea, vomiting -pain, swelling, irritation where injected -stomach upset -sweating This list may not  describe all possible side effects. Call your doctor for medical advice about side effects. You may report side effects to FDA at 1-800-FDA-1088. Where should I keep my medicine? Keep out of the reach of children. Store at room temperature below 25 degrees C (77 degrees F). Protect from light. Throw away any unused vials after the expiration date. NOTE: This sheet is a summary. It may not cover all possible information. If you have questions about this medicine, talk to your doctor, pharmacist, or health care provider.  2017 Elsevier/Gold Standard (2013-07-17 09:14:54)
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
## 2017-09-16 NOTE — ED Notes (Signed)
Per SANE nurse, pt has to be medically cleared before she is able to make any other decisions.

## 2017-09-16 NOTE — ED Notes (Signed)
Phlebotomy in room drawing blood work
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
## 2017-09-16 NOTE — ED Notes (Signed)
SANE nurse arrival

## 2017-09-16 NOTE — Consult Note (Signed)
Called for consult on patient. Caller advises patient has not seen a physician. Advised to clear patient for medical complaints and call back when patient is nearing discharge.

## 2017-09-16 NOTE — ED Notes (Signed)
Pt ambulated to the restroom.

## 2017-09-16 NOTE — ED Notes (Signed)
Pt to xray
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
## 2017-09-17 LAB — HEPATITIS B SURFACE ANTIGEN: HEP B S AG: NEGATIVE

## 2017-09-17 LAB — RPR: RPR Ser Ql: NONREACTIVE

## 2017-09-17 LAB — HEPATITIS C ANTIBODY: HCV Ab: 2.1 s/co ratio — ABNORMAL HIGH (ref 0.0–0.9)

## 2017-09-17 MED FILL — GENVOYA TABLET: 150-150-200 | 25 days supply | Qty: 25 | Fill #0
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
## 2017-09-17 NOTE — Consult Note (Signed)
09/16/17 @ 18:43Darrin Richard prescription faxed to [facility] pharmacy along with the following [employer] information:  BIN# [id number] PCN: [id number] Group: [id number] Member# [id number]

## 2018-03-10 IMAGING — DX DG KNEE COMPLETE 4+V*L*
4 series · 4 of 4 positions shown · non-contrast
Comparison: June 19, 2014

CLINICAL DATA: Pain following fall

EXAM:
LEFT KNEE - COMPLETE 4+ VIEW

[knee ap (1 of 3)]
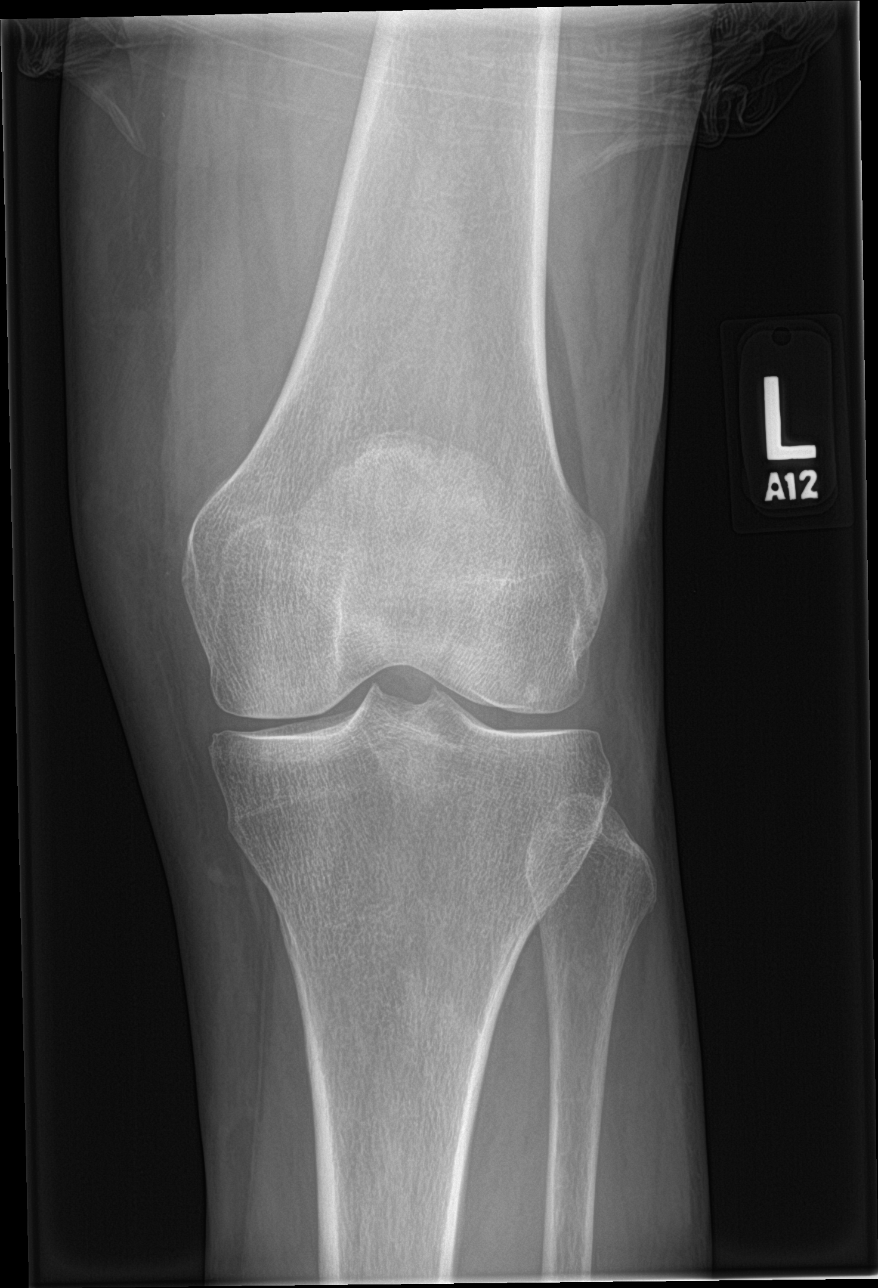

[knee ap (2 of 3)]
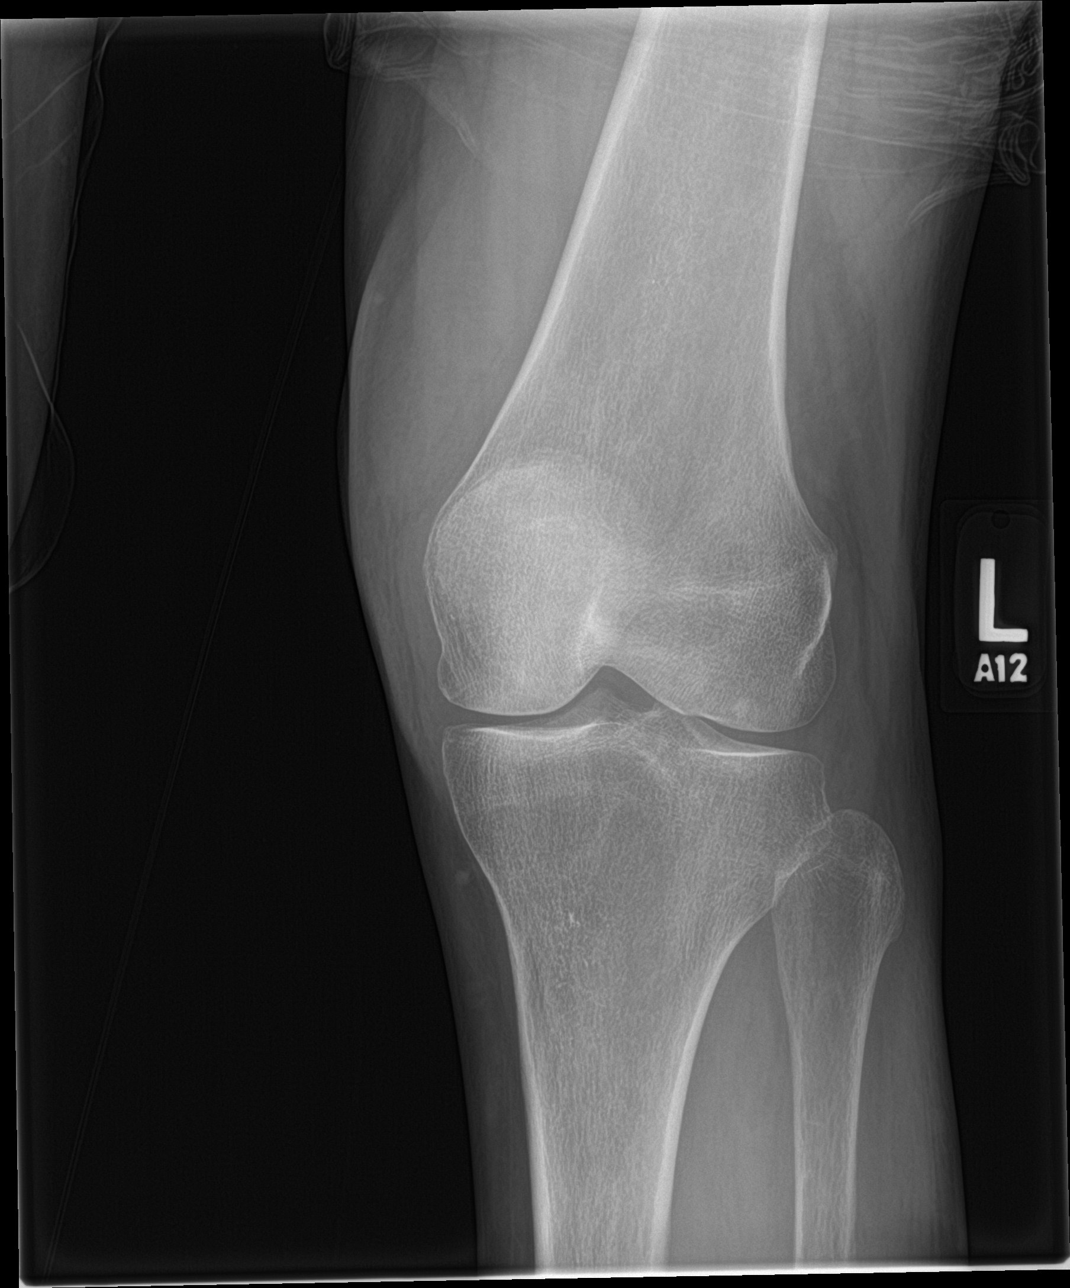

[knee ap (3 of 3)]
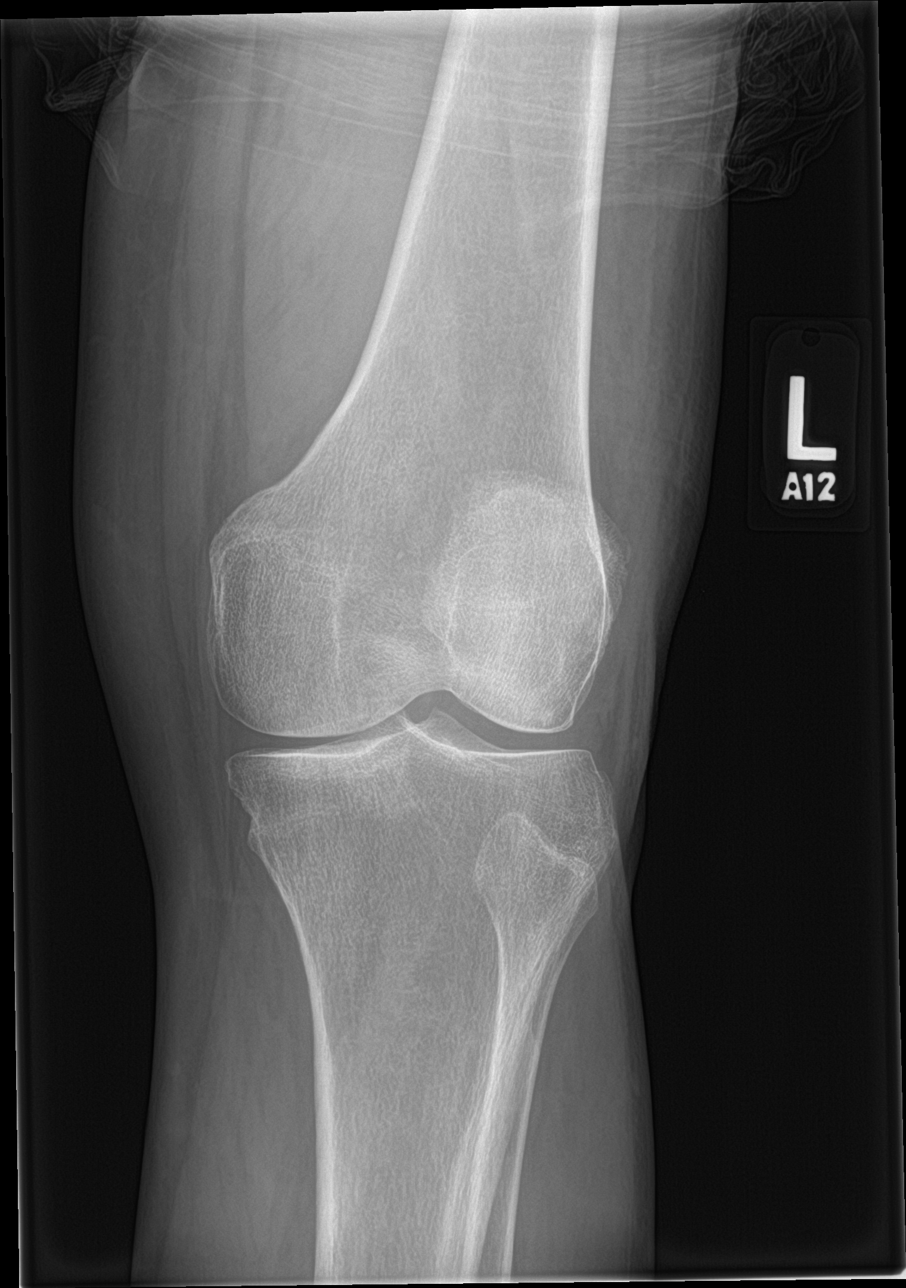

[knee lat]
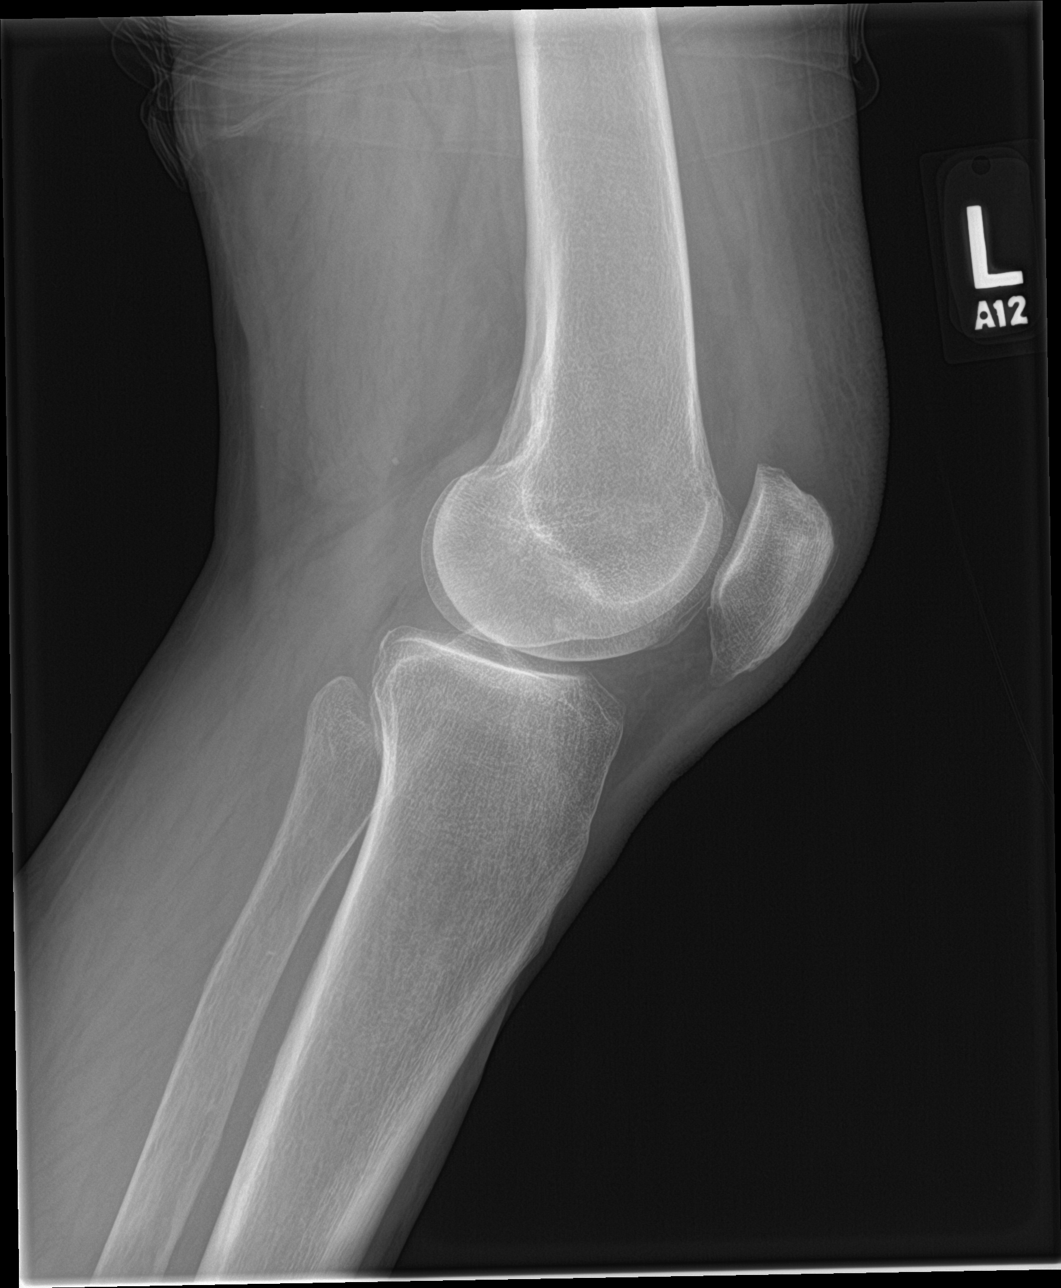

[4 of 4 positions shown; findings below may reference images not displayed]

FINDINGS: Frontal, lateral, and bilateral oblique views were obtained. There
is no fracture or dislocation. No joint effusion. There is stable
joint space narrowing medially. Joint spaces elsewhere appear
unremarkable. No erosive change.
IMPRESSION: Stable narrowing medially.  No fracture or joint effusion.

## 2018-03-10 IMAGING — MG 2D DIGITAL SCREENING BILATERAL MAMMOGRAM WITH CAD AND ADJUNCT TO
9 series · 9 of 25 positions shown · non-contrast
Comparison: Previous exam(s).

CLINICAL DATA: Screening.

EXAM:
2D DIGITAL SCREENING BILATERAL MAMMOGRAM WITH CAD AND ADJUNCT TOMO

[R MLO (1 of 2)]
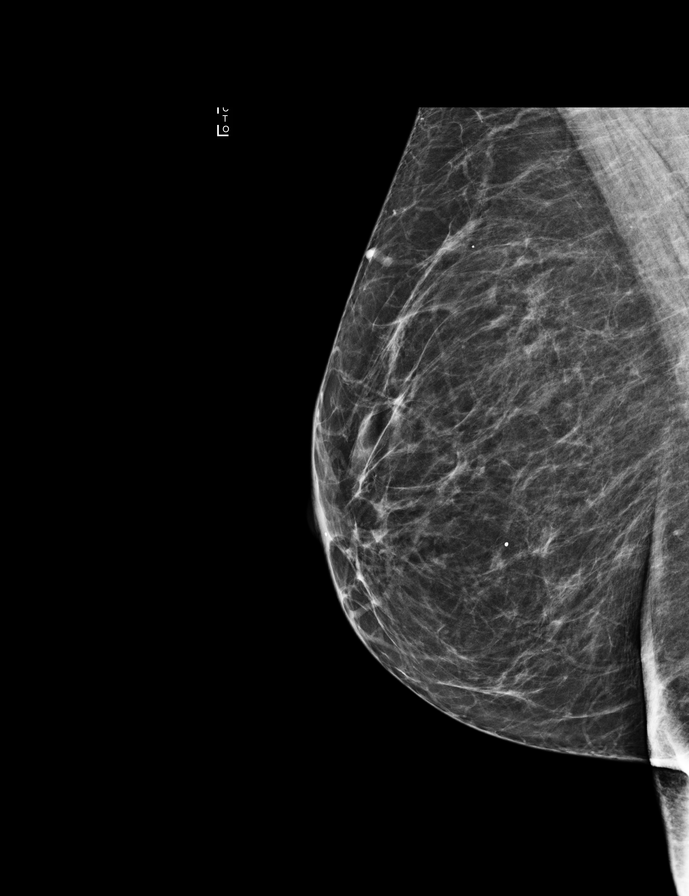

[L CC]
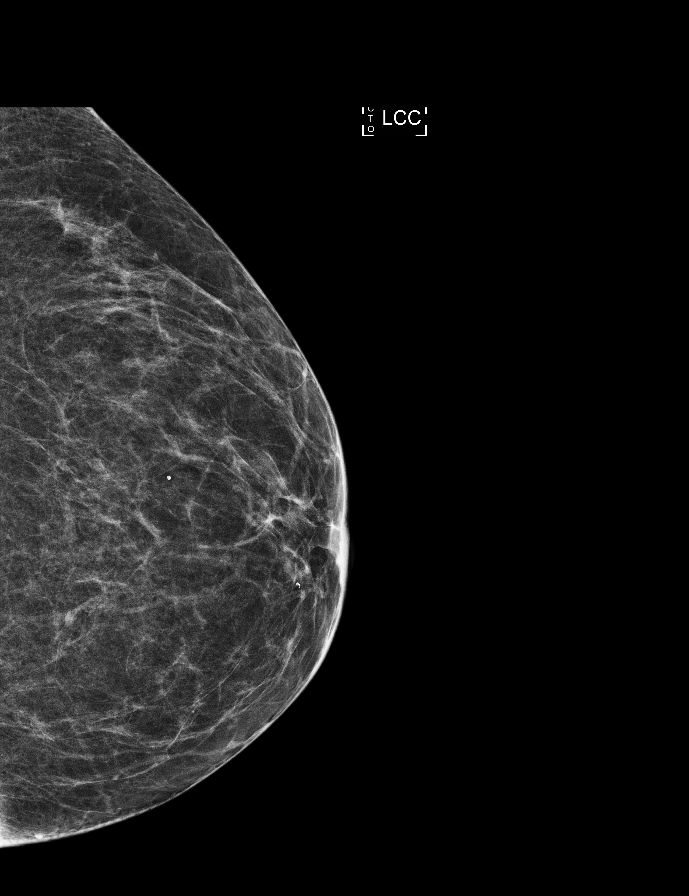

[L MLO]
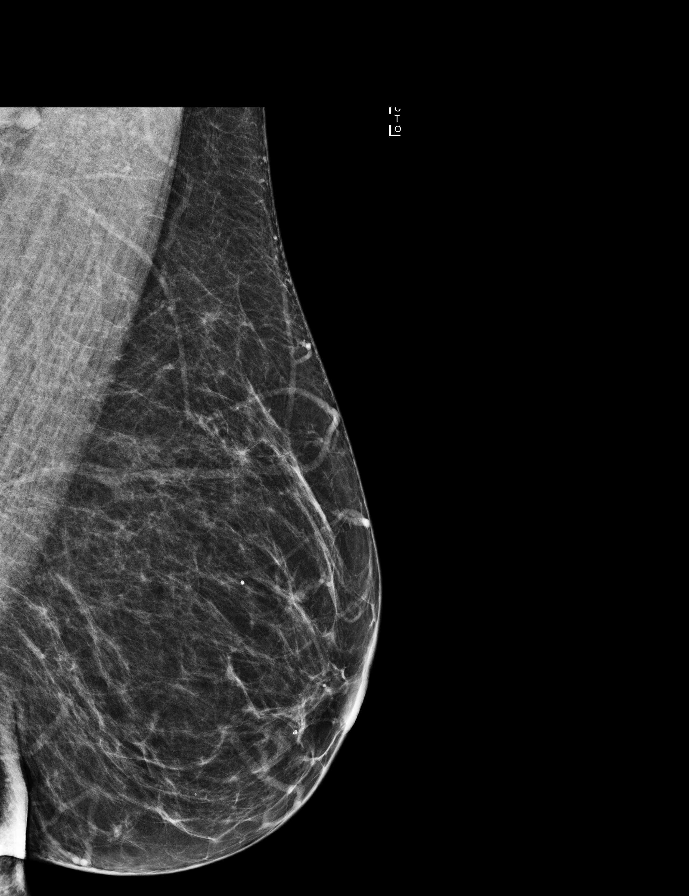

[R MLO (2 of 2)]
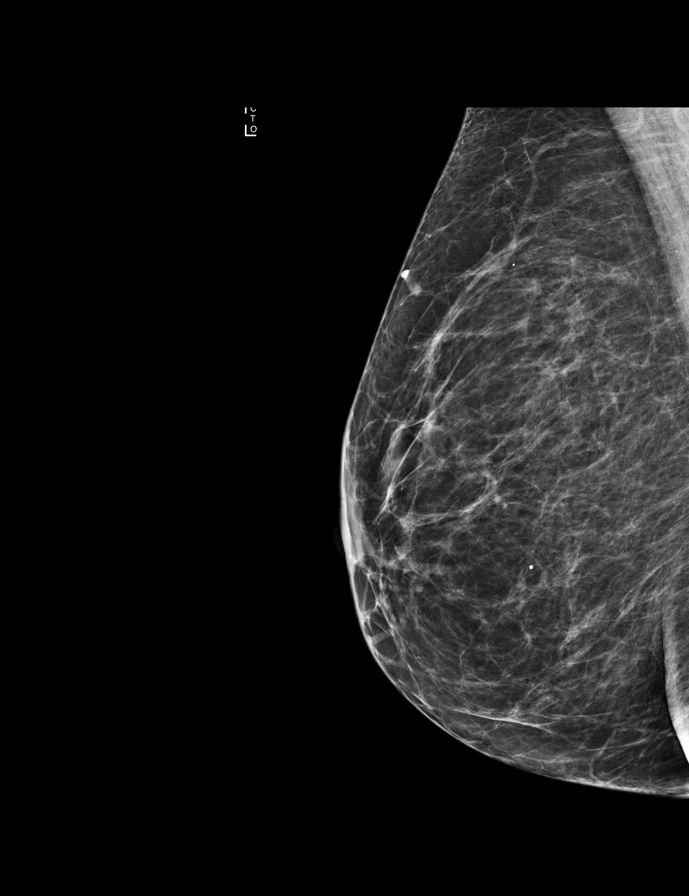

[R CC]
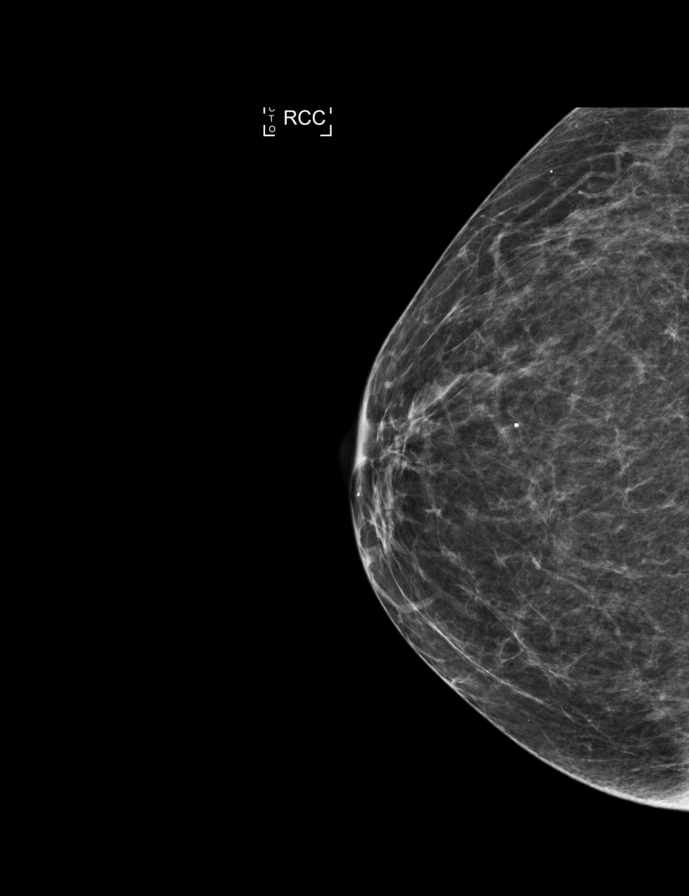

[R MLO tomo · tomo slice 31/62.0]
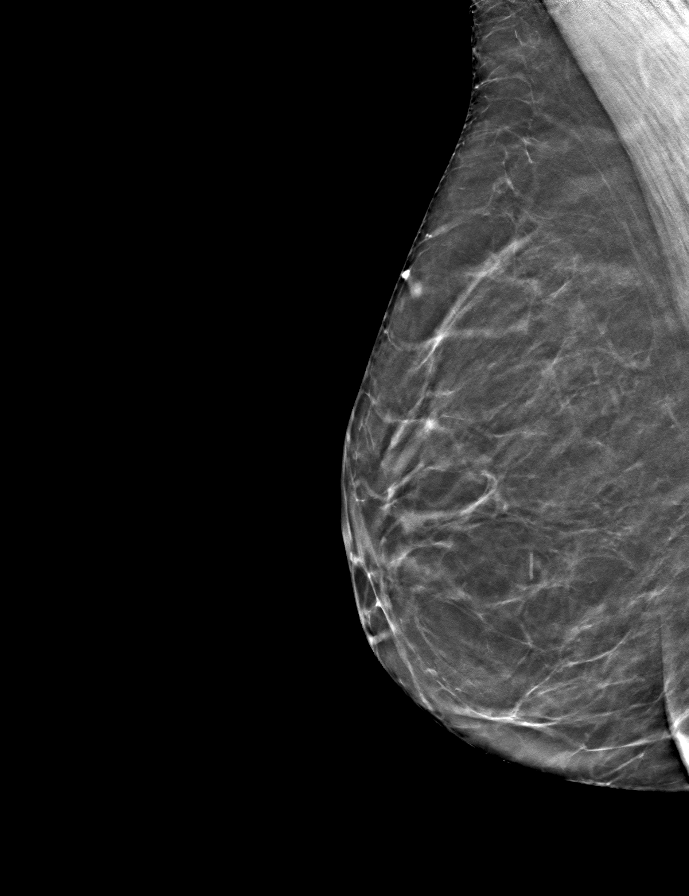

[L CC tomo · tomo slice 29/58.0]
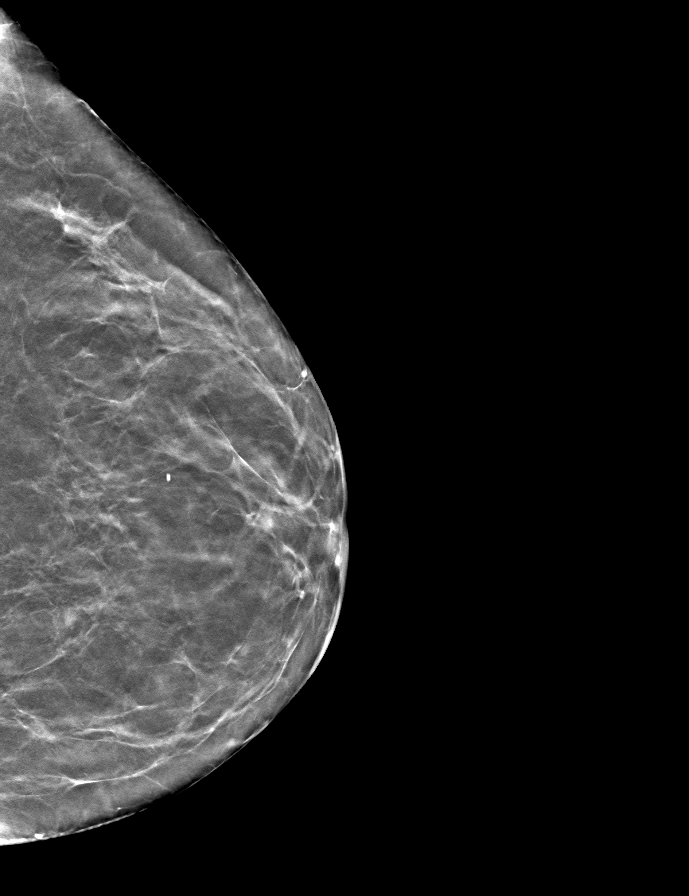

[R CC tomo · tomo slice 29/58.0]
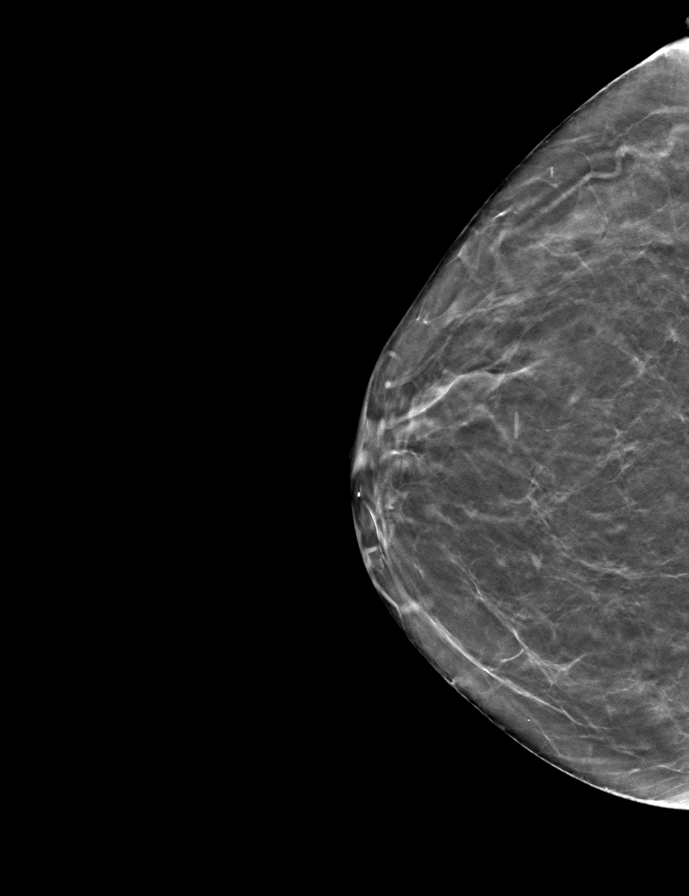

[L MLO tomo · tomo slice 33/65.0]
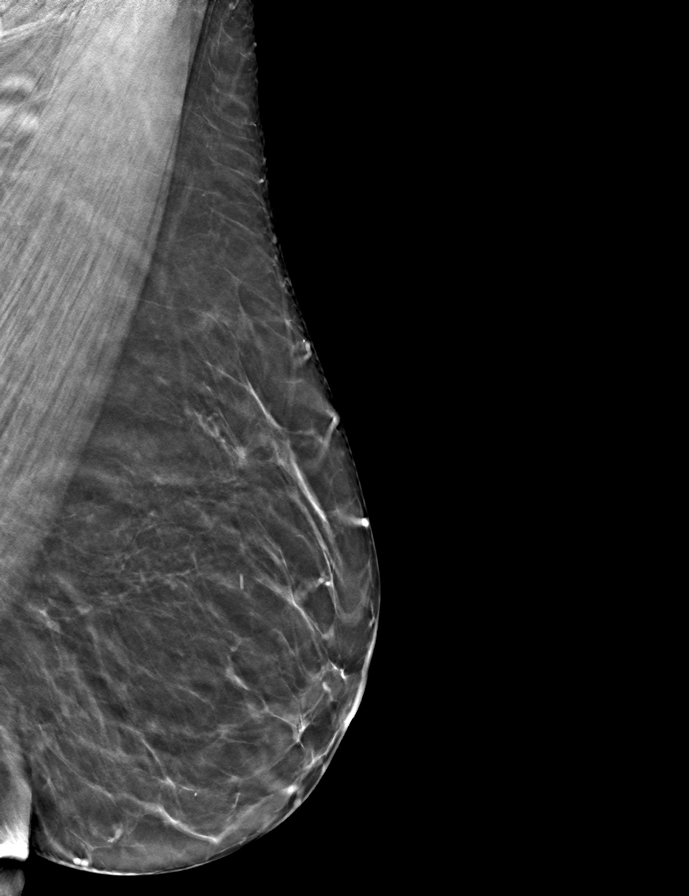

[9 of 25 positions shown; findings below may reference images not displayed]

ACR Breast Density Category b: There are scattered areas of
fibroglandular density.
FINDINGS: There are no findings suspicious for malignancy. Images were
processed with CAD.
IMPRESSION: No mammographic evidence of malignancy. A result letter of this
screening mammogram will be mailed directly to the patient.

RECOMMENDATION:
Screening mammogram in one year. (Code:97-6-RS4)

BI-RADS CATEGORY  1: Negative.
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
## 2019-03-23 IMAGING — US US ABDOMEN LIMITED
1 series · 14 of 25 positions shown · non-contrast
Comparison: Prior CT from 08/26/2017.

CLINICAL DATA: Initial evaluation for acute right upper quadrant
pain with nausea and vomiting.

EXAM:
ULTRASOUND ABDOMEN LIMITED RIGHT UPPER QUADRANT

[Series 1: us abdomen limited · 0.18mm/px · 14 of 51 slices shown]
[im 1/51]
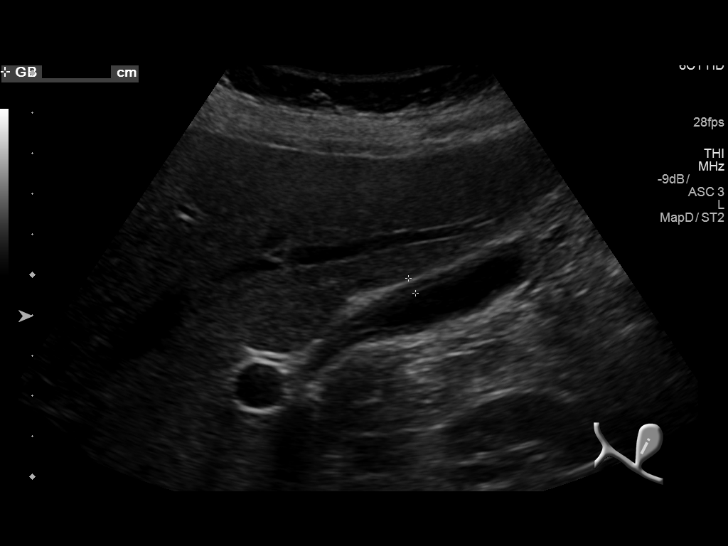
[im 5/51]
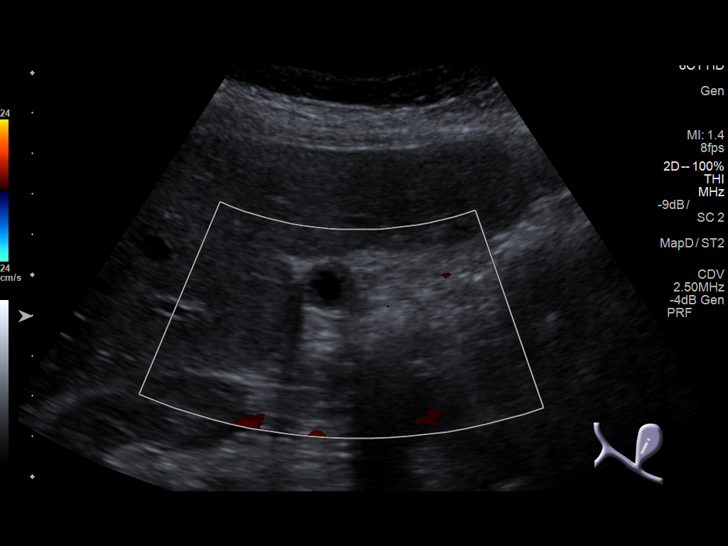
[im 9/51]
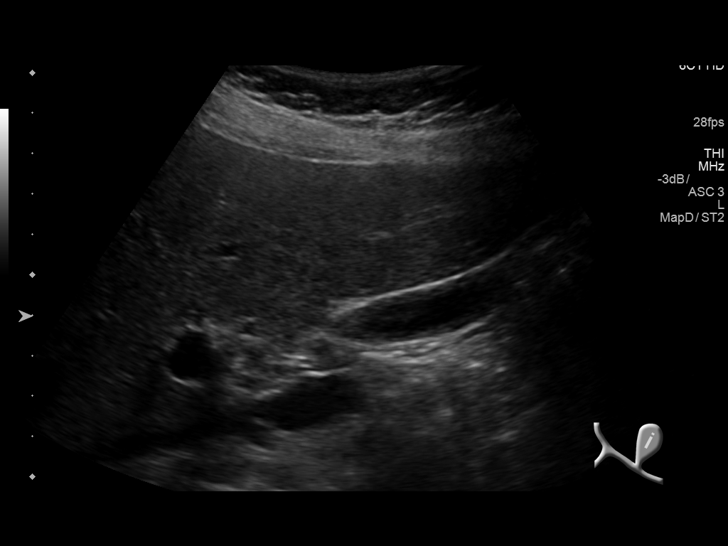
[im 13/51]
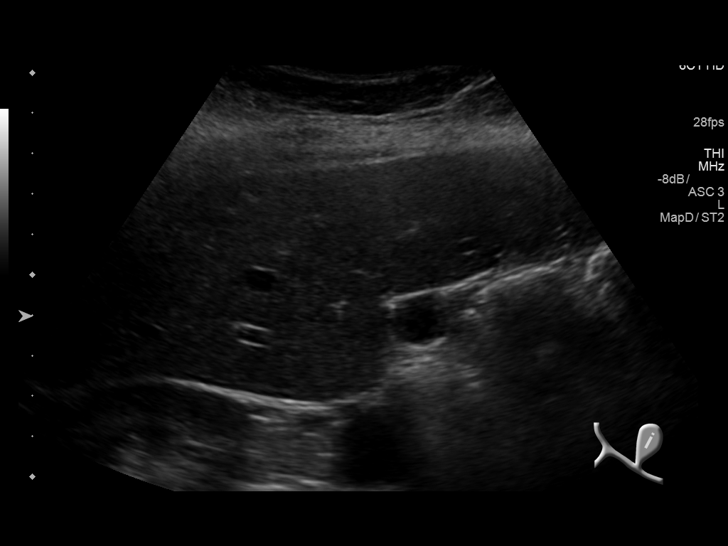
[im 17/51]
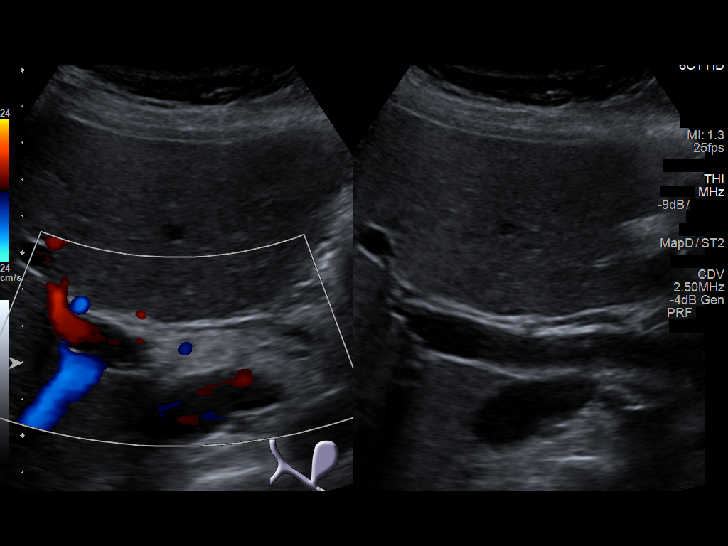
[im 19/51]
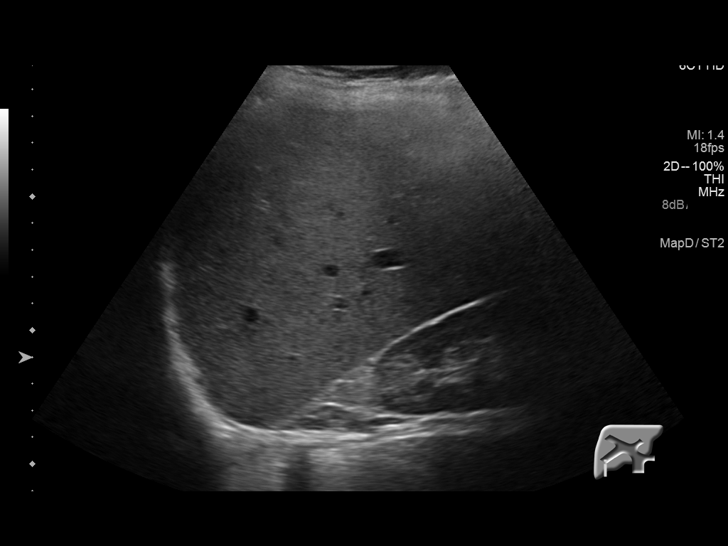
[im 23/51]
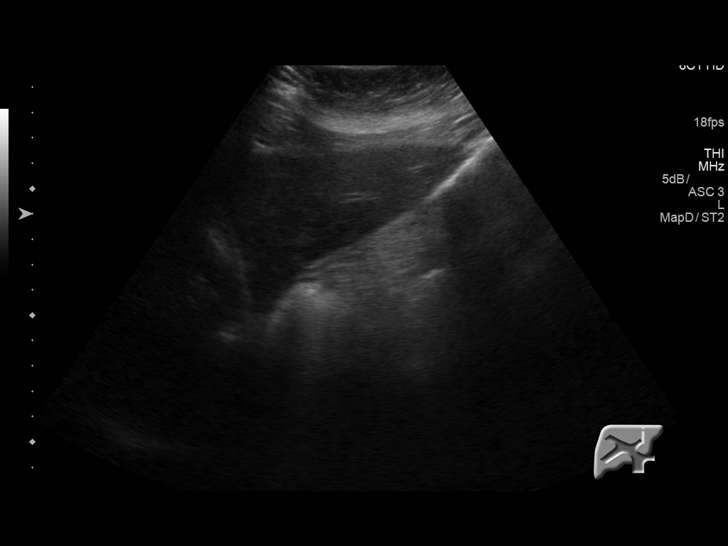
[im 28/51]
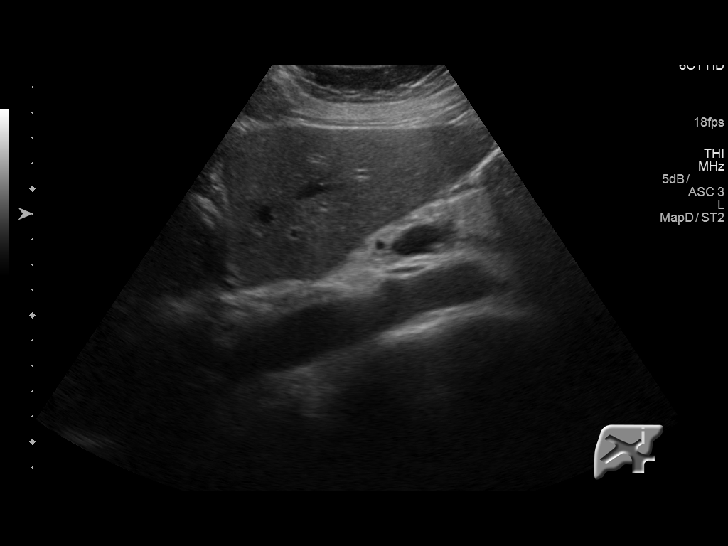
[im 32/51]
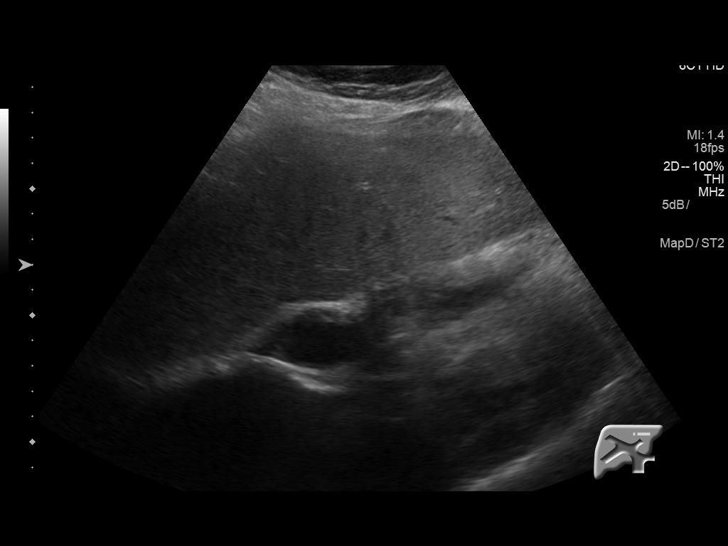
[im 34/51]
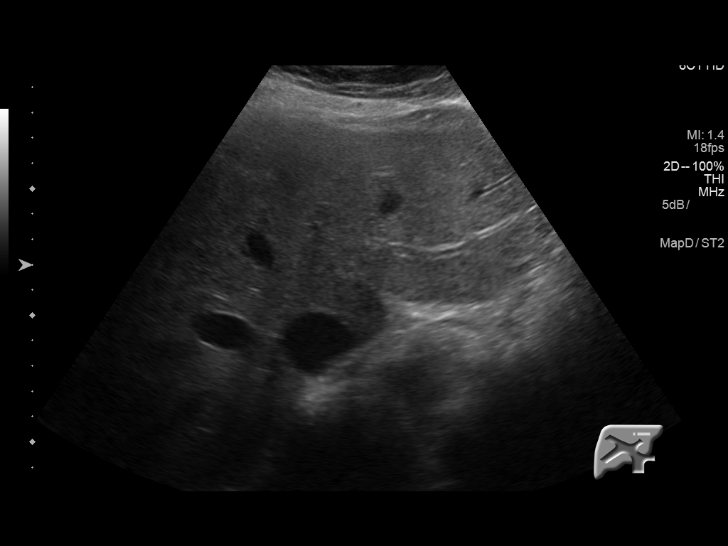
[im 38/51]
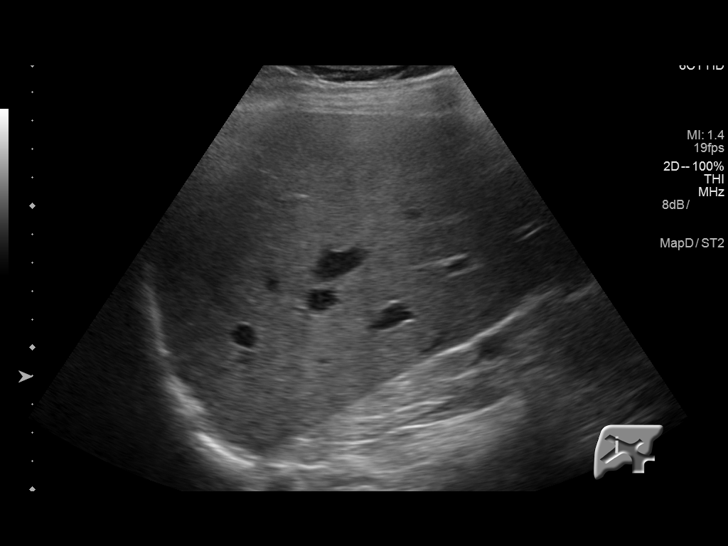
[im 42/51]
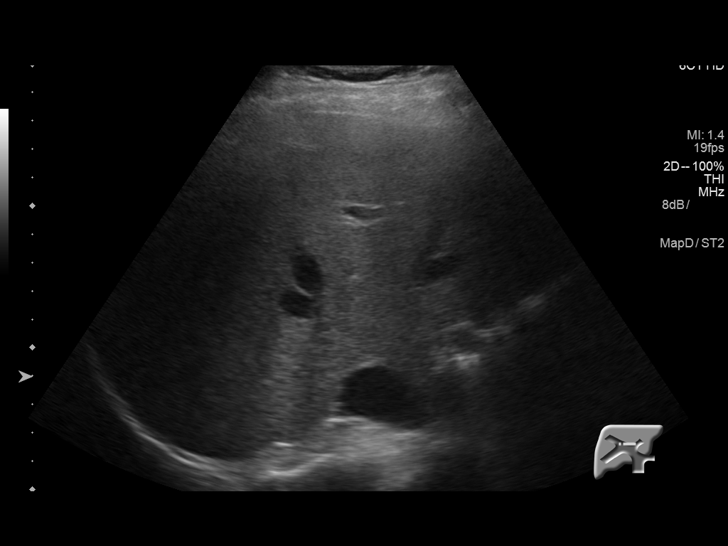
[im 46/51]
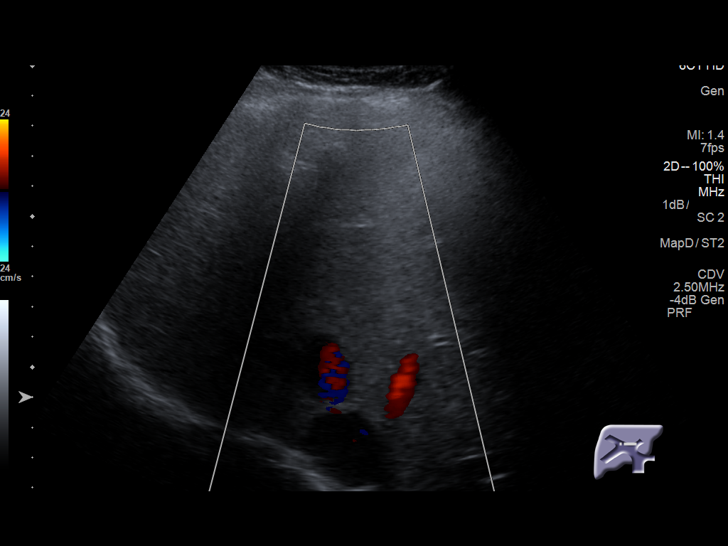
[im 51/51]
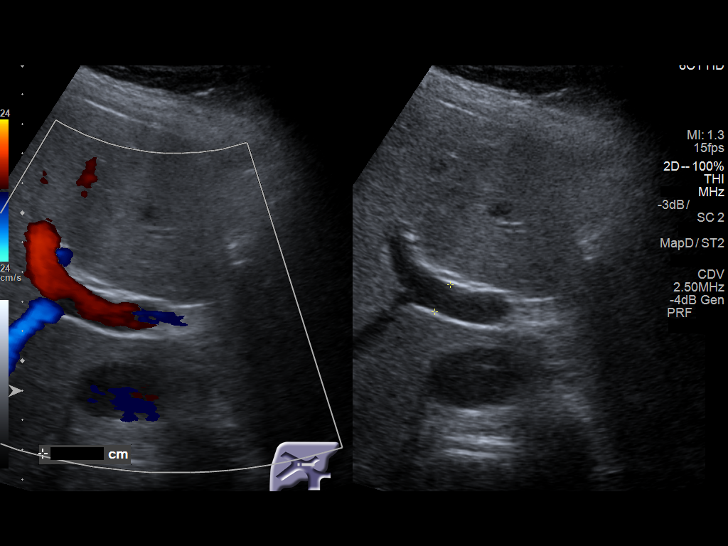

[14 of 25 positions shown; findings below may reference images not displayed]

FINDINGS: Gallbladder:

No stones or sludge within the gallbladder lumen. Gallbladder wall
mildly thickened up to 4 mm. No free pericholecystic fluid. Positive
sonographic Murphy sign was elicited on exam.

Common bile duct:

Diameter: 5 mm

Liver:

No focal lesion identified. Diffusely increased echogenicity within
the renal parenchyma. Portal vein is patent on color Doppler imaging
with normal direction of blood flow towards the liver.
IMPRESSION: 1. Mild gallbladder wall thickening with positive sonographic Murphy
sign without internal stones or sludge. Clinical correlation for
possible acalculous cholecystitis recommended.
2. No biliary dilatation.
3. Hepatic steatosis.

## 2019-03-29 IMAGING — DX DG FOOT COMPLETE 3+V*L*
3 series · 3 of 3 positions shown · non-contrast
Comparison: Left foot series 05/08/2015.

CLINICAL DATA: 51-year-old female status post slip and fall in
bathroom with left foot injury and pain.

EXAM:
LEFT FOOT - COMPLETE 3+ VIEW

[foot ap]
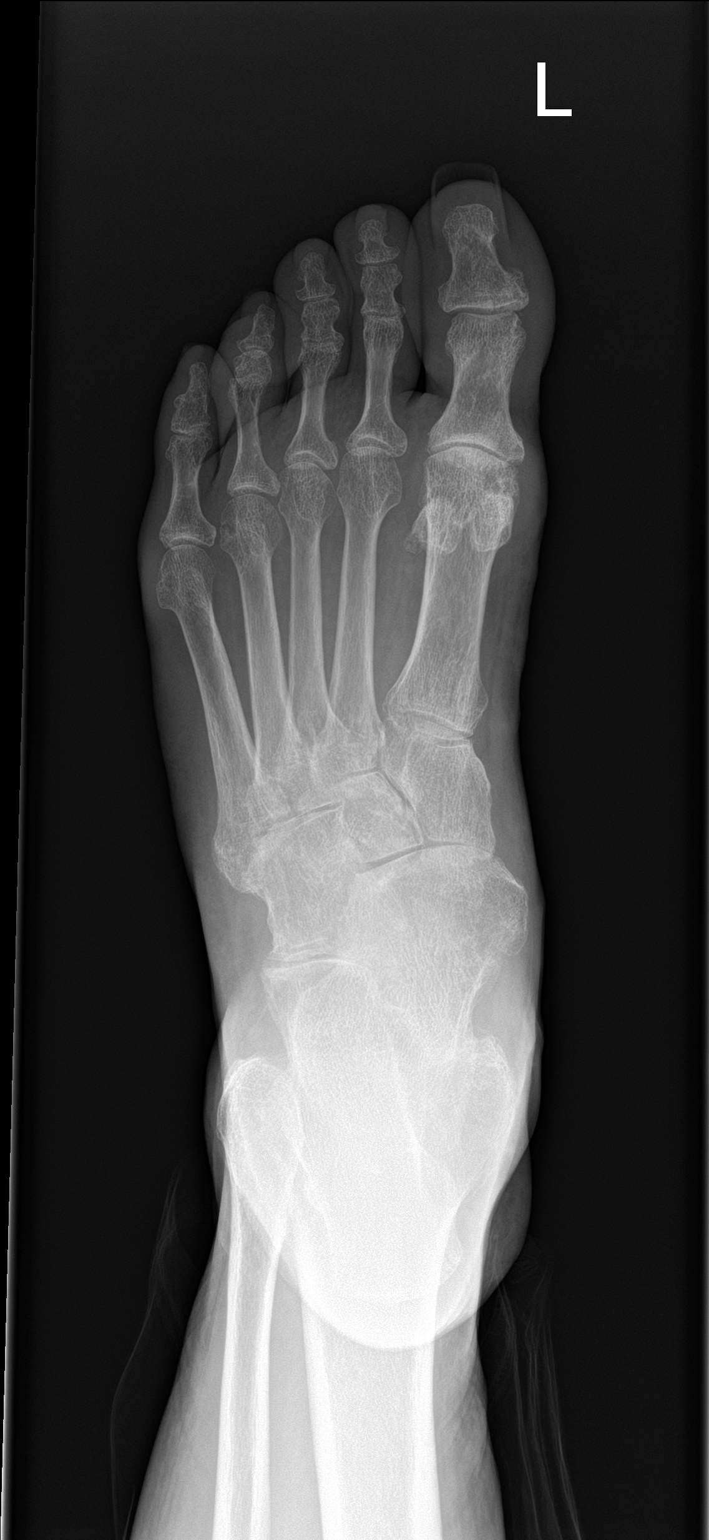

[foot obl]
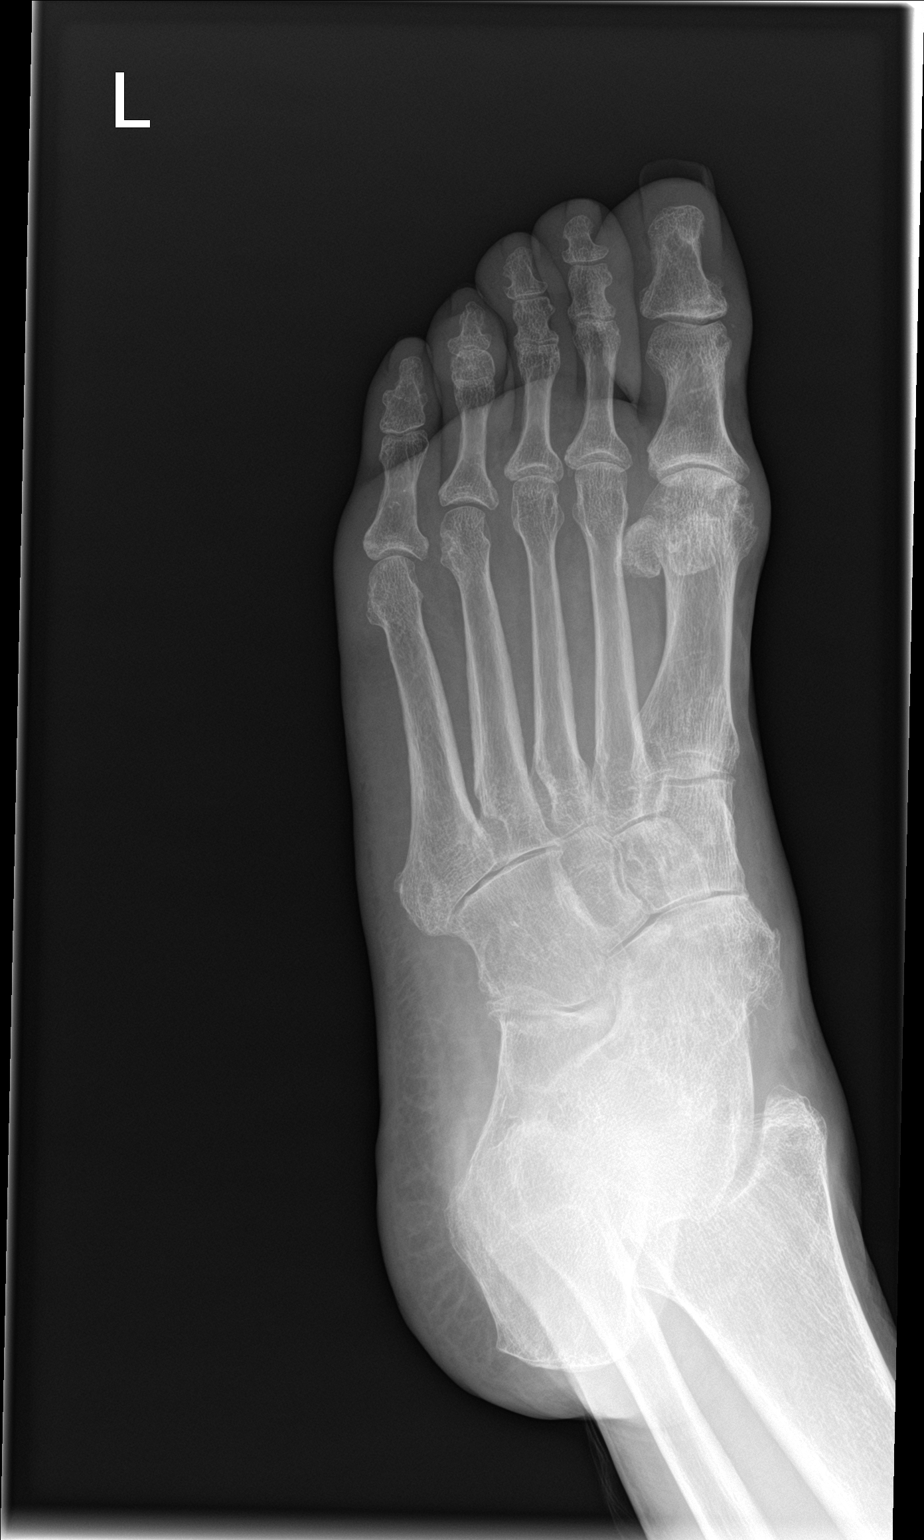

[foot lat]
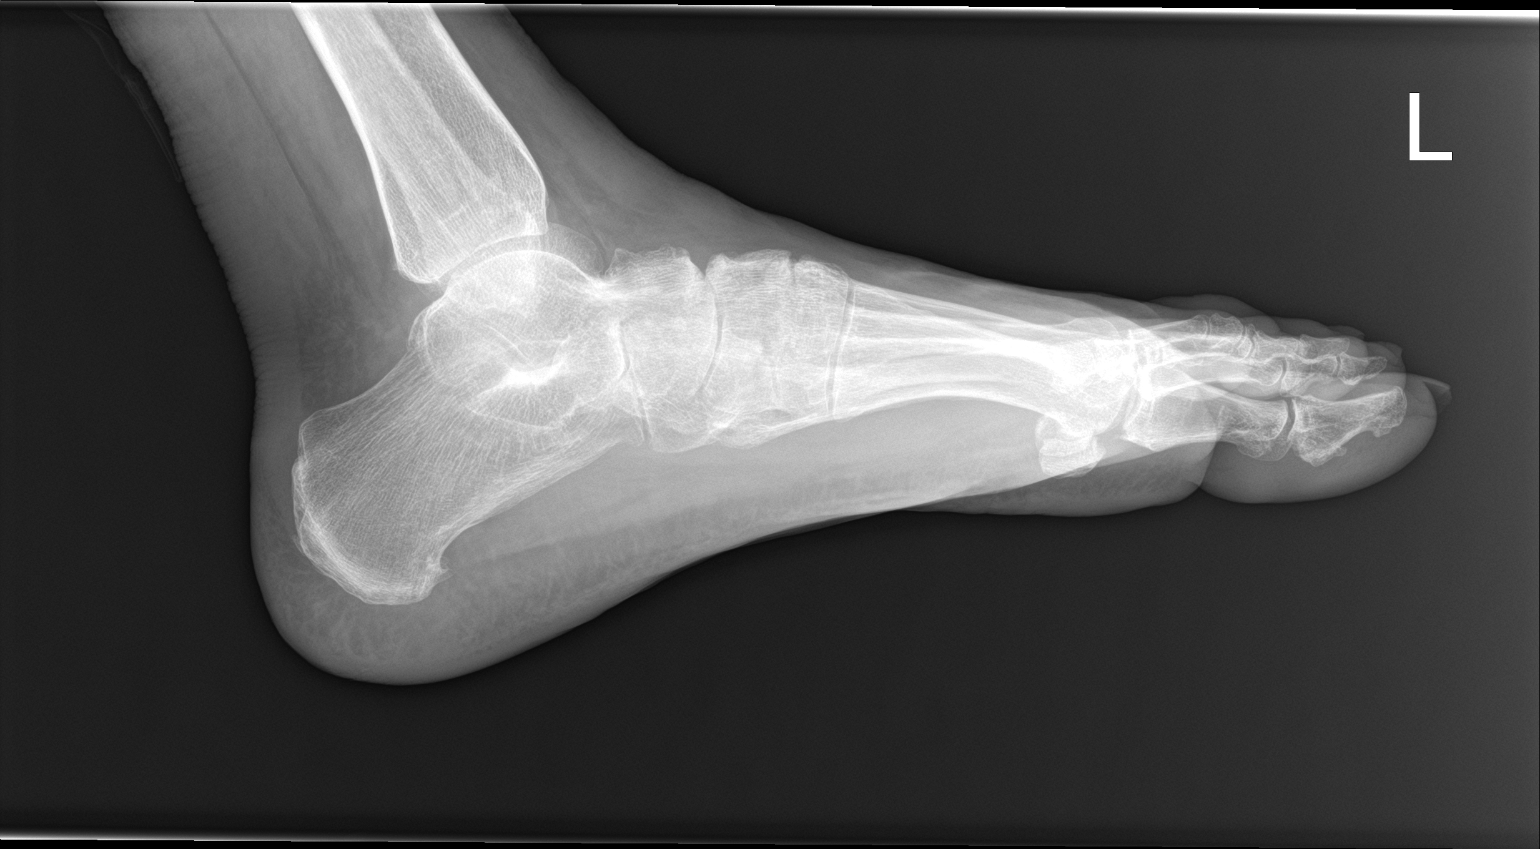

[3 of 3 positions shown; findings below may reference images not displayed]

FINDINGS: Chronic degenerative appearing subchondral sclerosis in the tarsal
bones and left 1st MTP. Superimposed marginal chronic erosion along
the medial 1st MTP. Mildly increased tarsal bone spurring since
3526. The calcaneus appears intact. No tarsal bone fracture
identified. Stable metatarsal alignment. No metatarsal fracture
identified. No phalanx fracture or dislocation identified.
IMPRESSION: 1.  No acute fracture or dislocation identified about the left foot.
2. Advanced for age arthropathic changes in the left foot, including
periarticular erosion at the 1st MTP, with mild progression since

## 2019-04-25 IMAGING — DX DG ELBOW COMPLETE 3+V*L*
4 series · 4 of 4 positions shown · non-contrast
Comparison: 07/24/2014

CLINICAL DATA: Fall, left elbow pain/swelling, rheumatoid arthritis

EXAM:
LEFT ELBOW - COMPLETE 3+ VIEW

[elbow ap]
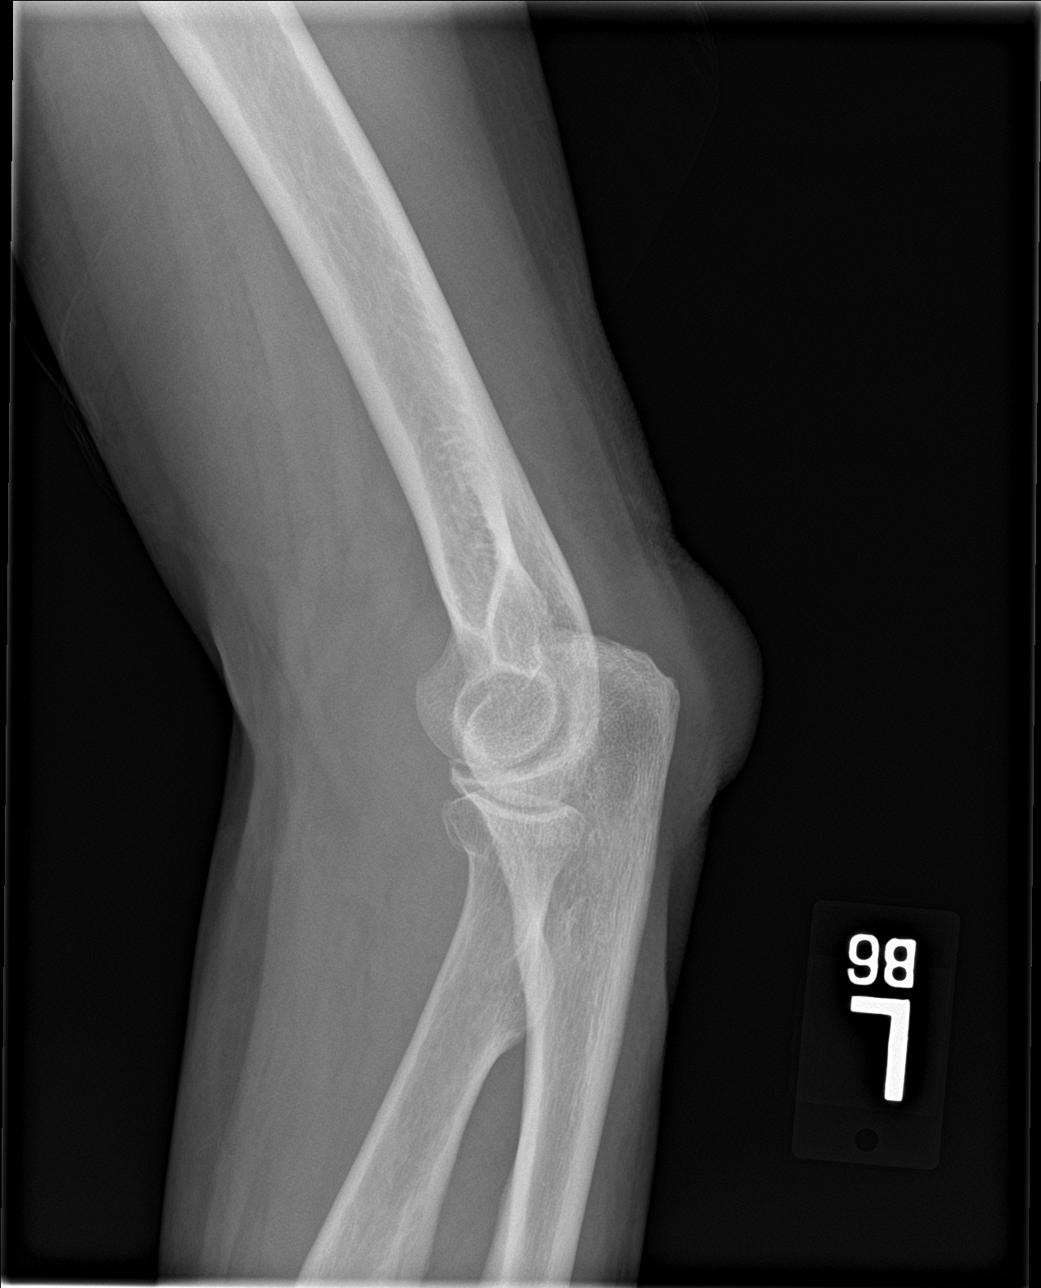

[elbow obl (1 of 2)]
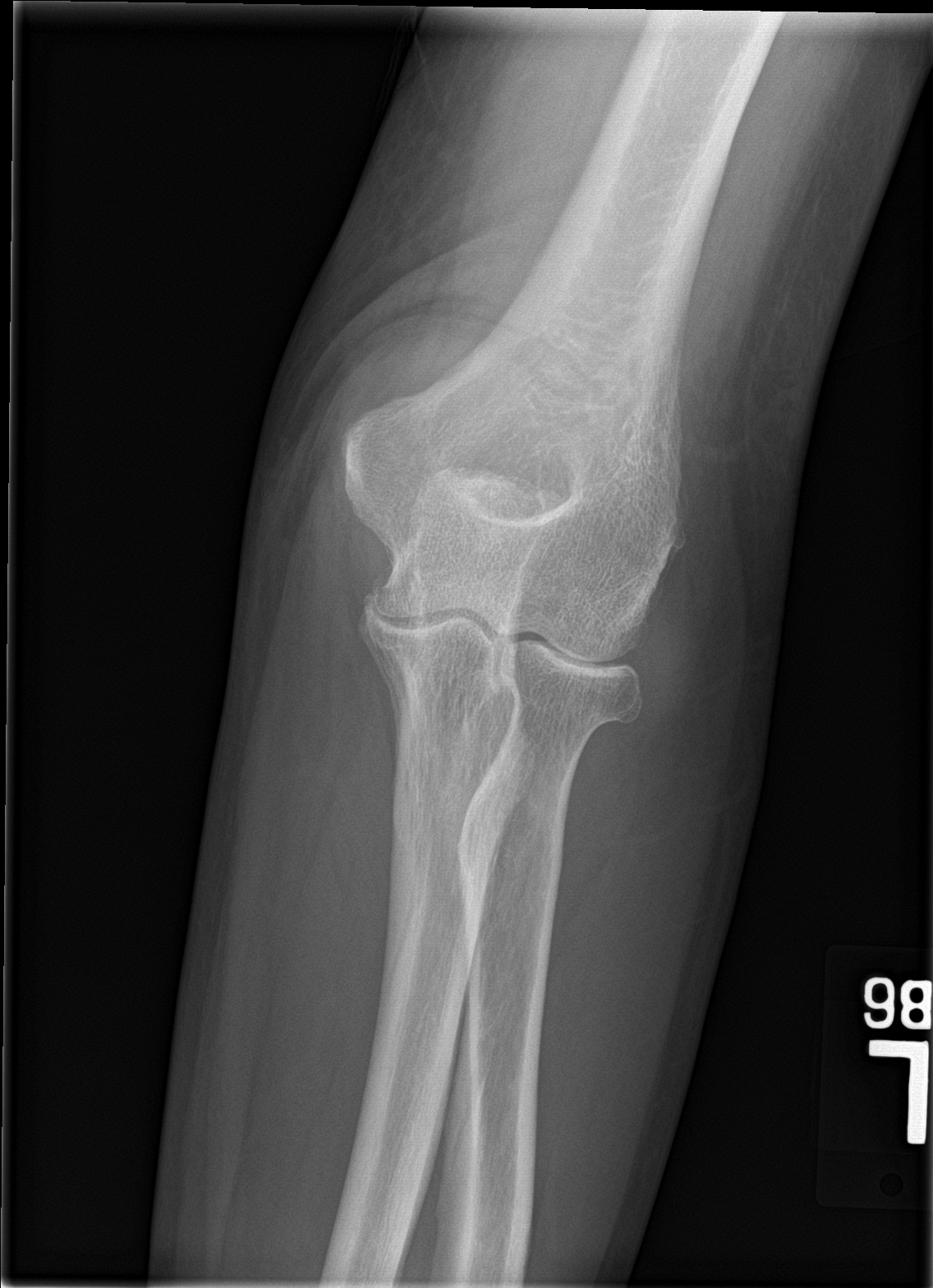

[elbow lat]
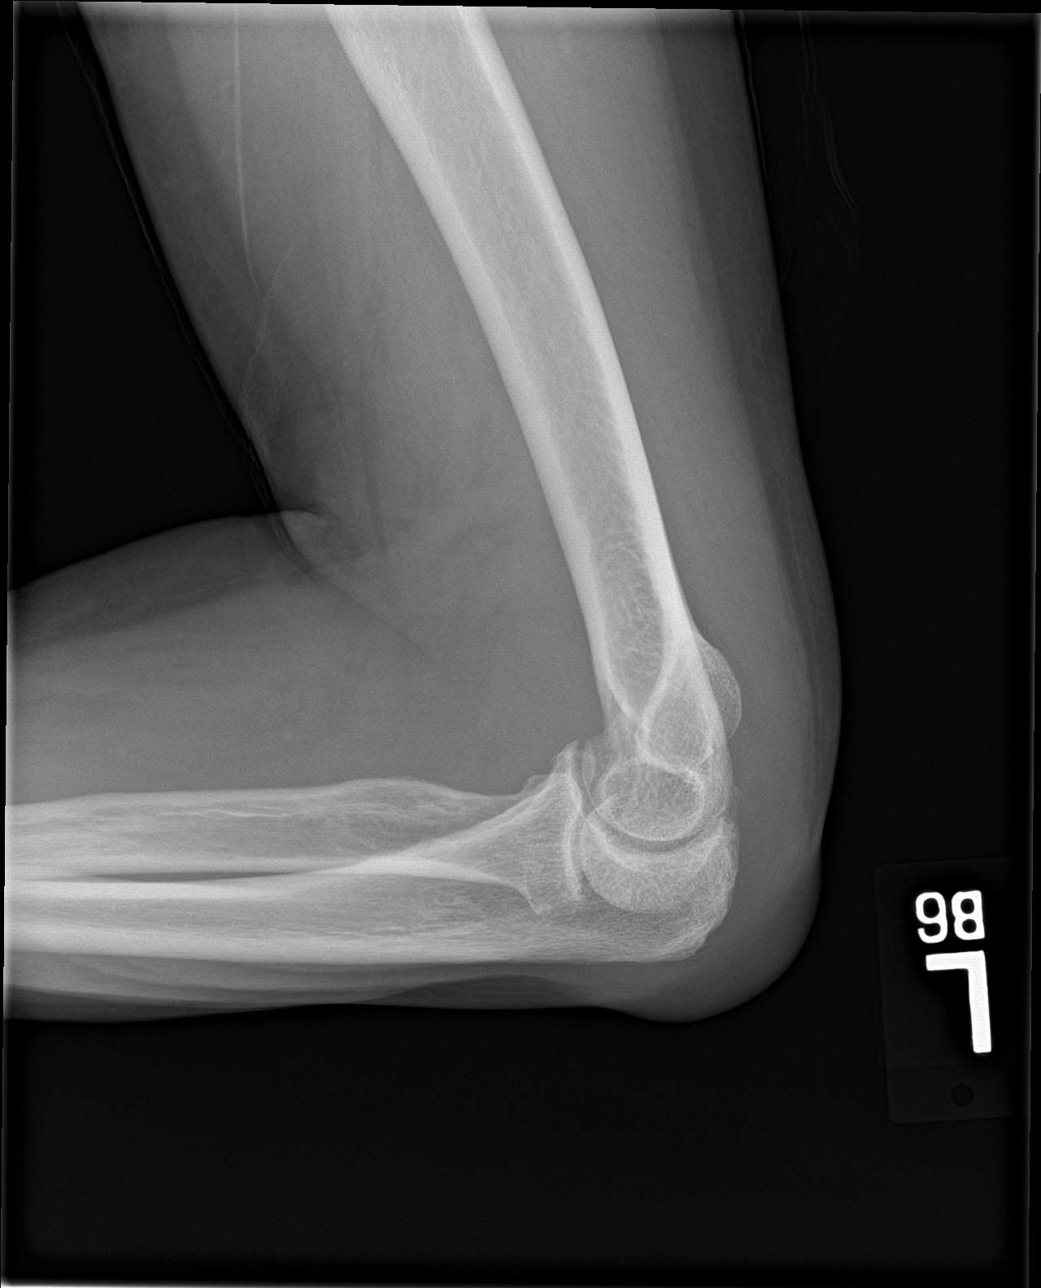

[elbow obl (2 of 2)]
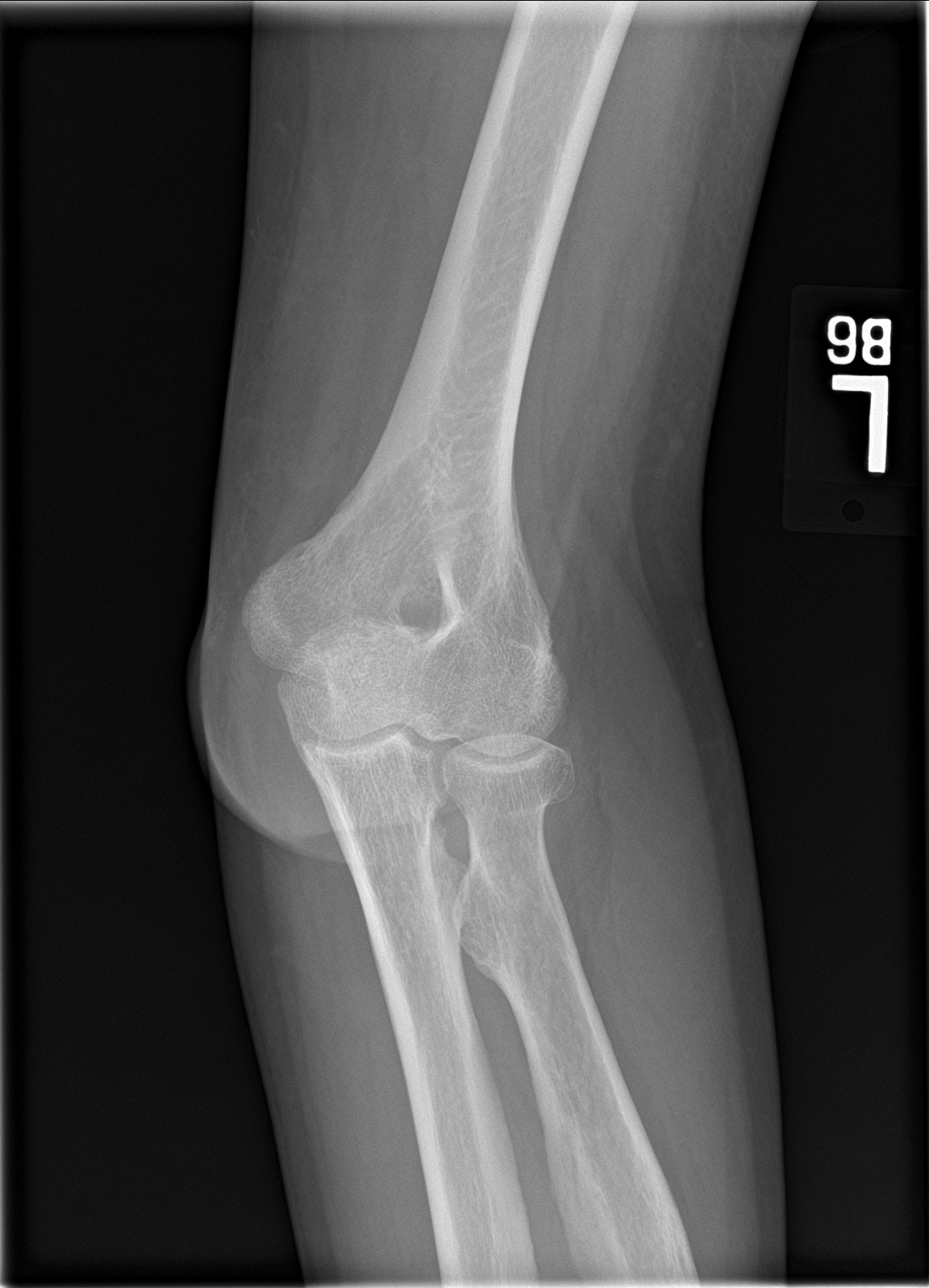

[4 of 4 positions shown; findings below may reference images not displayed]

FINDINGS: No fracture or dislocation is seen.

The joint spaces are preserved.

No displaced elbow joint fat pads suggest an elbow joint effusion.

Soft tissue swelling/hematoma overlying the olecranon.
IMPRESSION: Soft tissue swelling/hematoma overlying the olecranon.

No fracture or dislocation is seen.

## 2019-05-23 IMAGING — DX DG KNEE COMPLETE 4+V*R*
4 series · 4 of 4 positions shown · non-contrast
Comparison: None.

CLINICAL DATA: RIGHT knee pain.  Fell in bathtub last night.

EXAM:
RIGHT KNEE - COMPLETE 4+ VIEW

[knee ap]
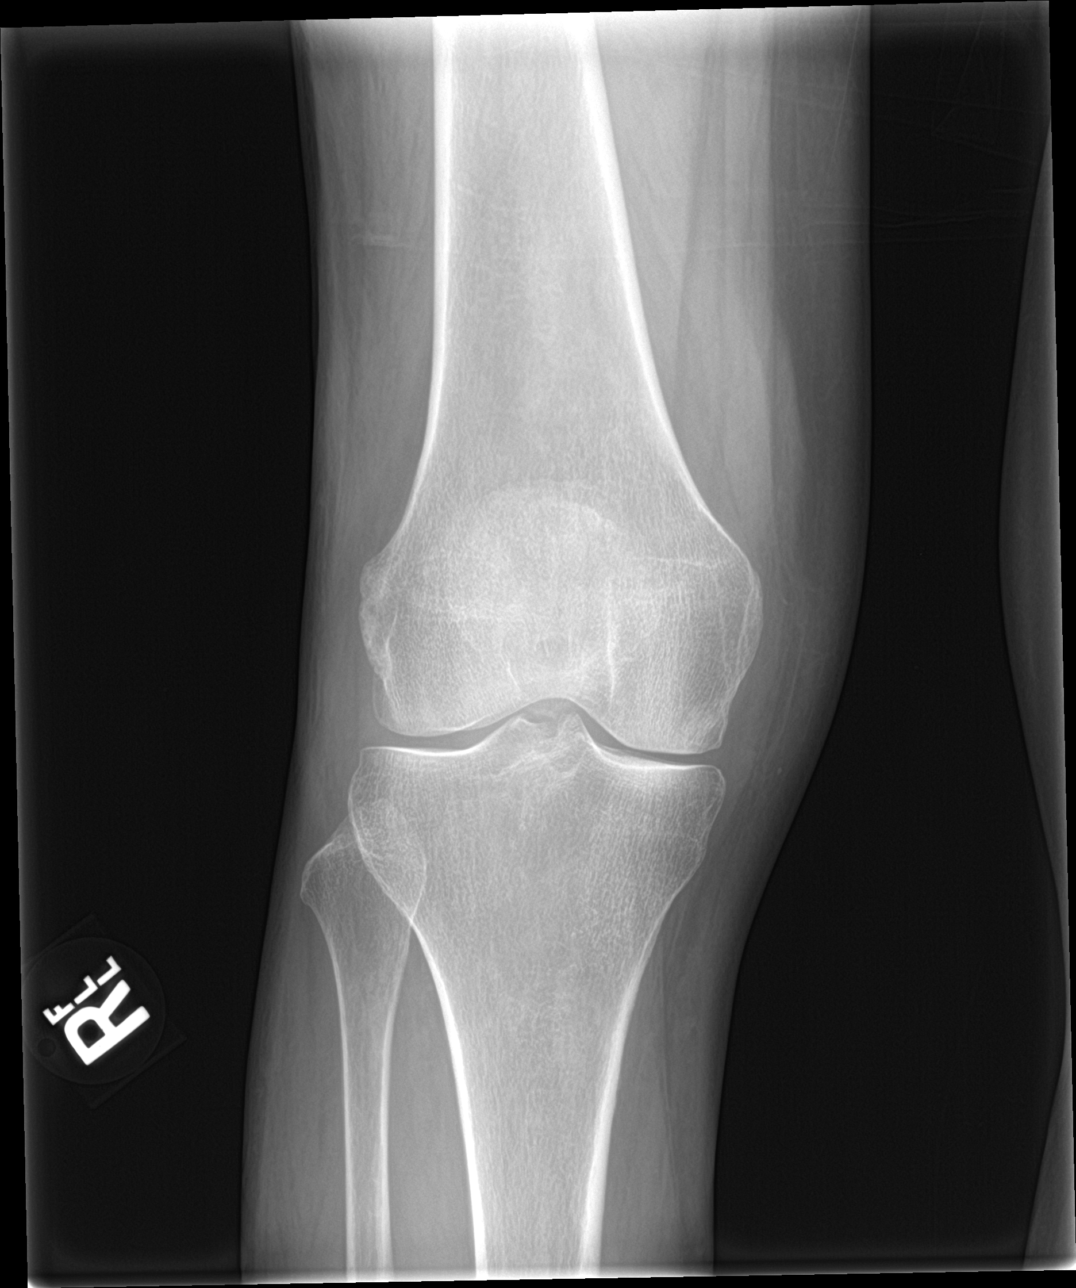

[tunnel]
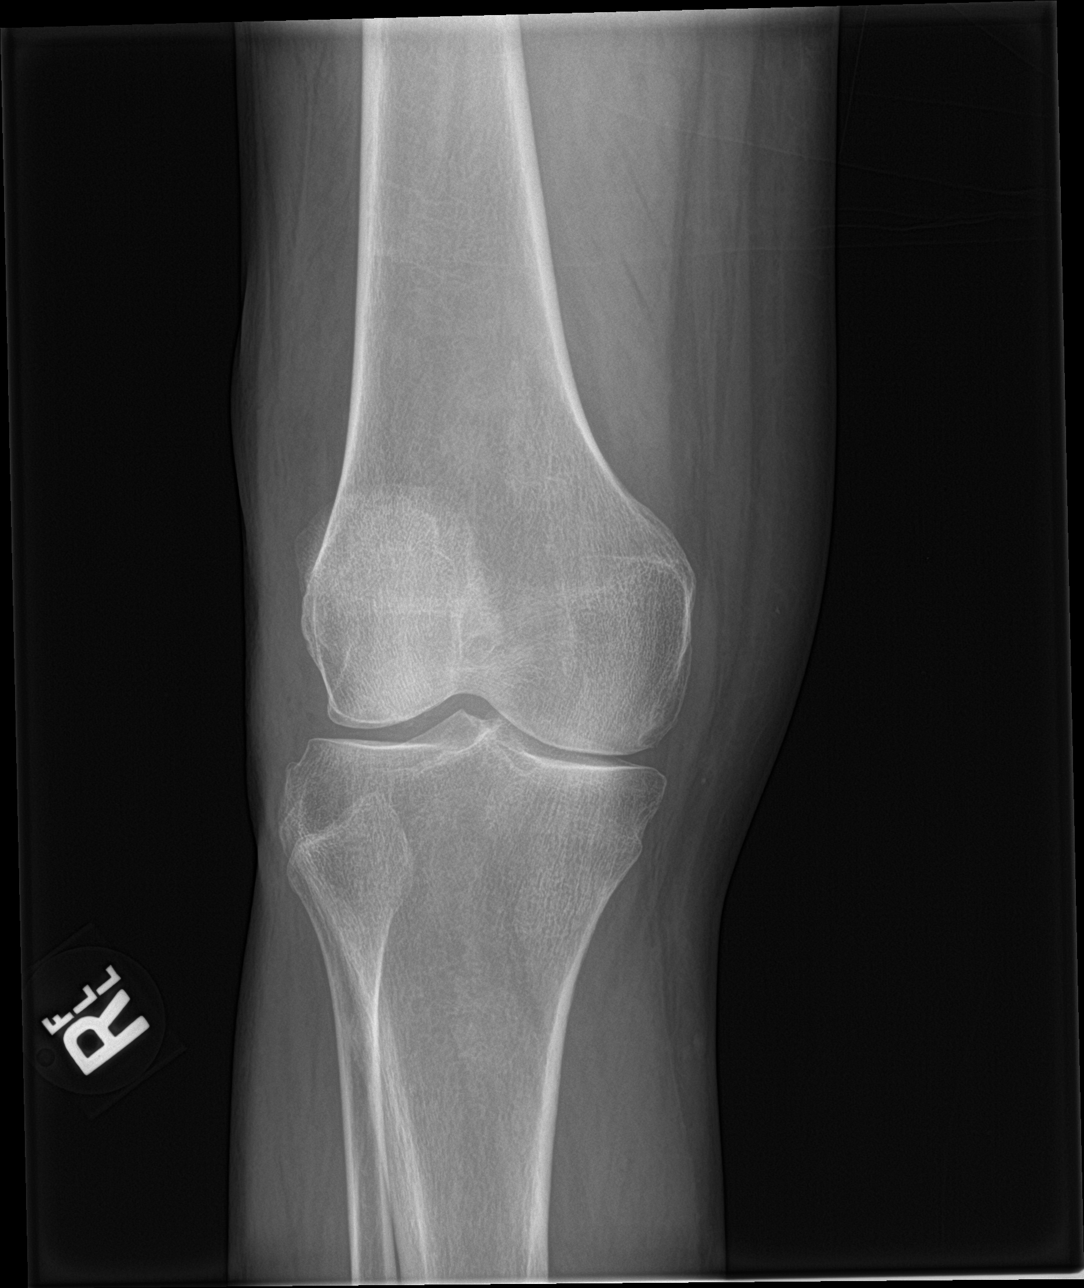

[knee lat]
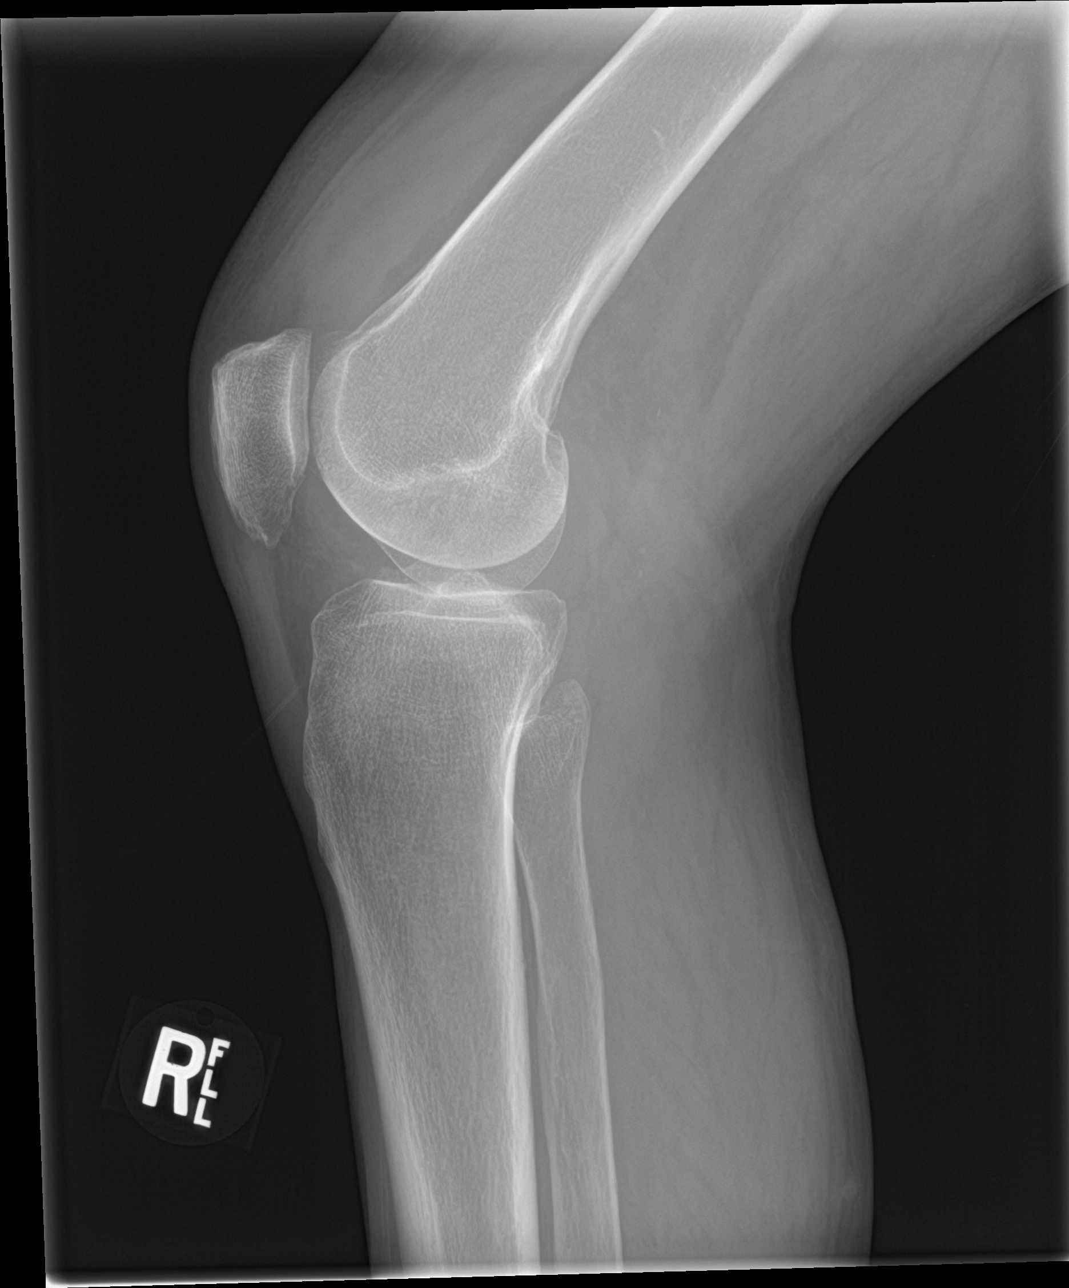

[knee sunrise]
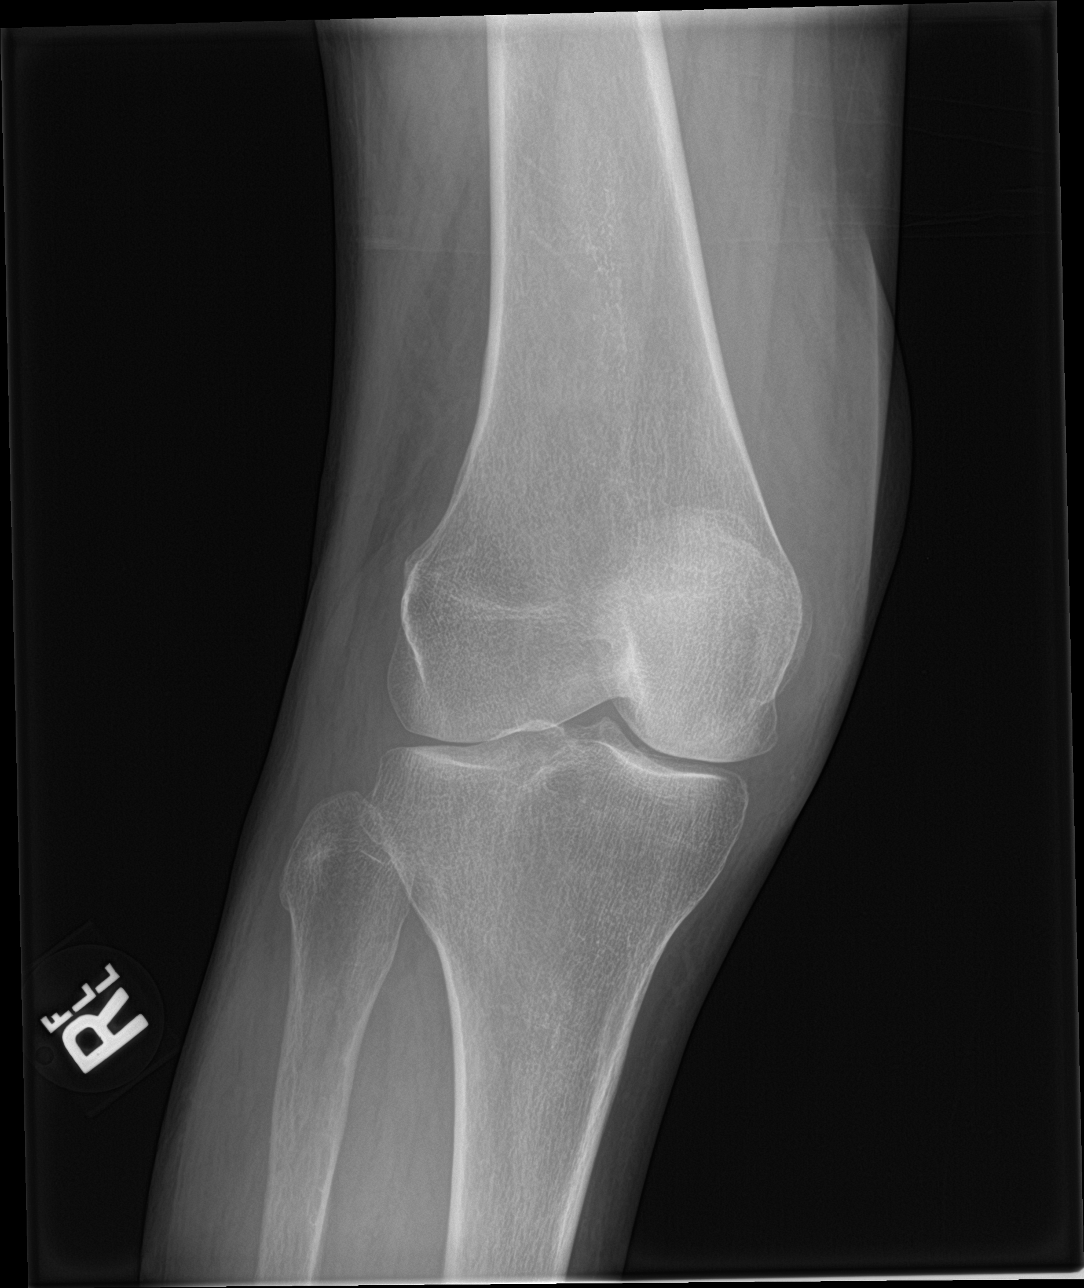

[4 of 4 positions shown; findings below may reference images not displayed]

FINDINGS: No evidence of fracture, dislocation, or joint effusion. No evidence
of arthropathy or other focal bone abnormality. Moderate
suprapatellar joint effusion without subcutaneous gas or radiopaque
foreign bodies.
IMPRESSION: Suprapatellar joint effusion without acute osseous process.

## 2019-08-14 IMAGING — DX DG KNEE COMPLETE 4+V*L*
4 series · 4 of 4 positions shown · non-contrast
Comparison: 02/26/2016.

CLINICAL DATA: Right knee pain following a fall this morning.

EXAM:
LEFT KNEE - COMPLETE 4+ VIEW

[knee ap (1 of 3)]
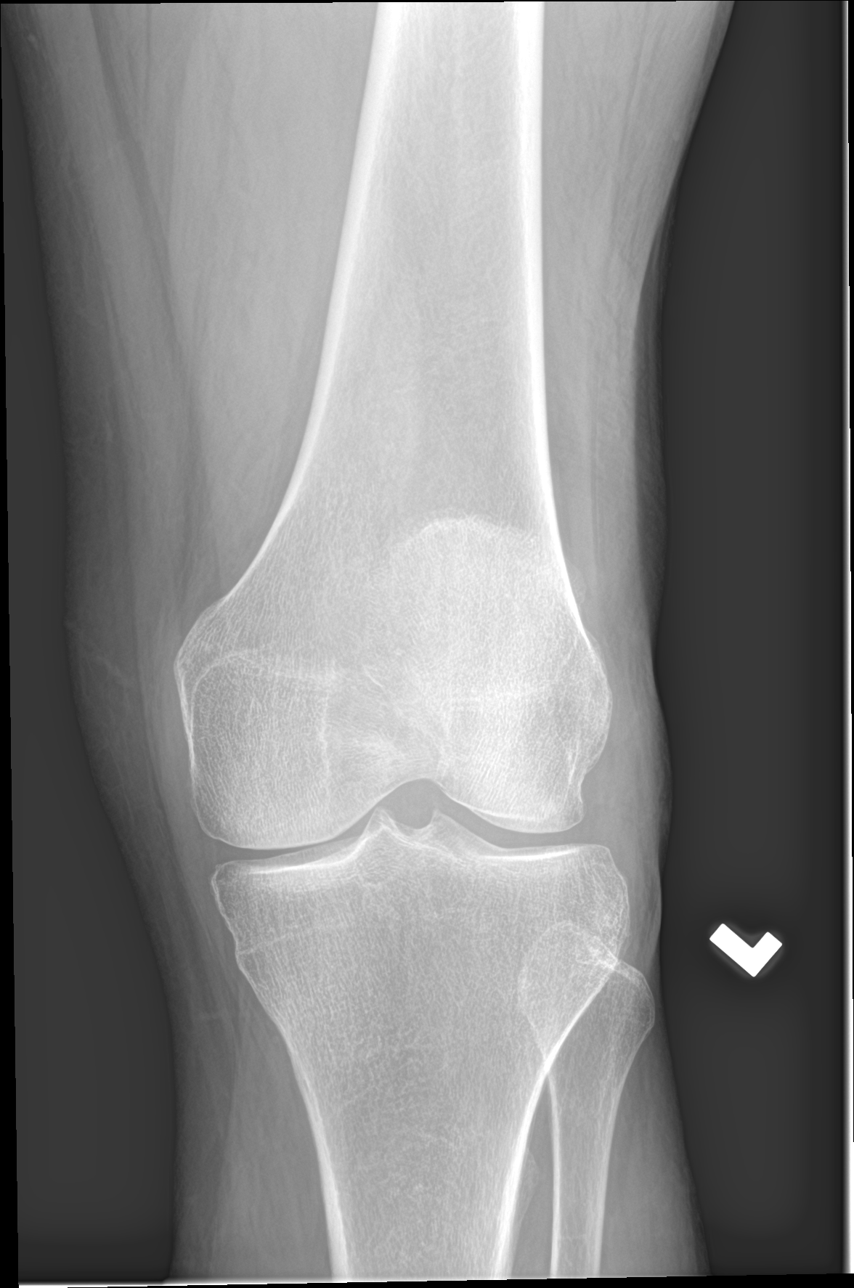

[knee lat]
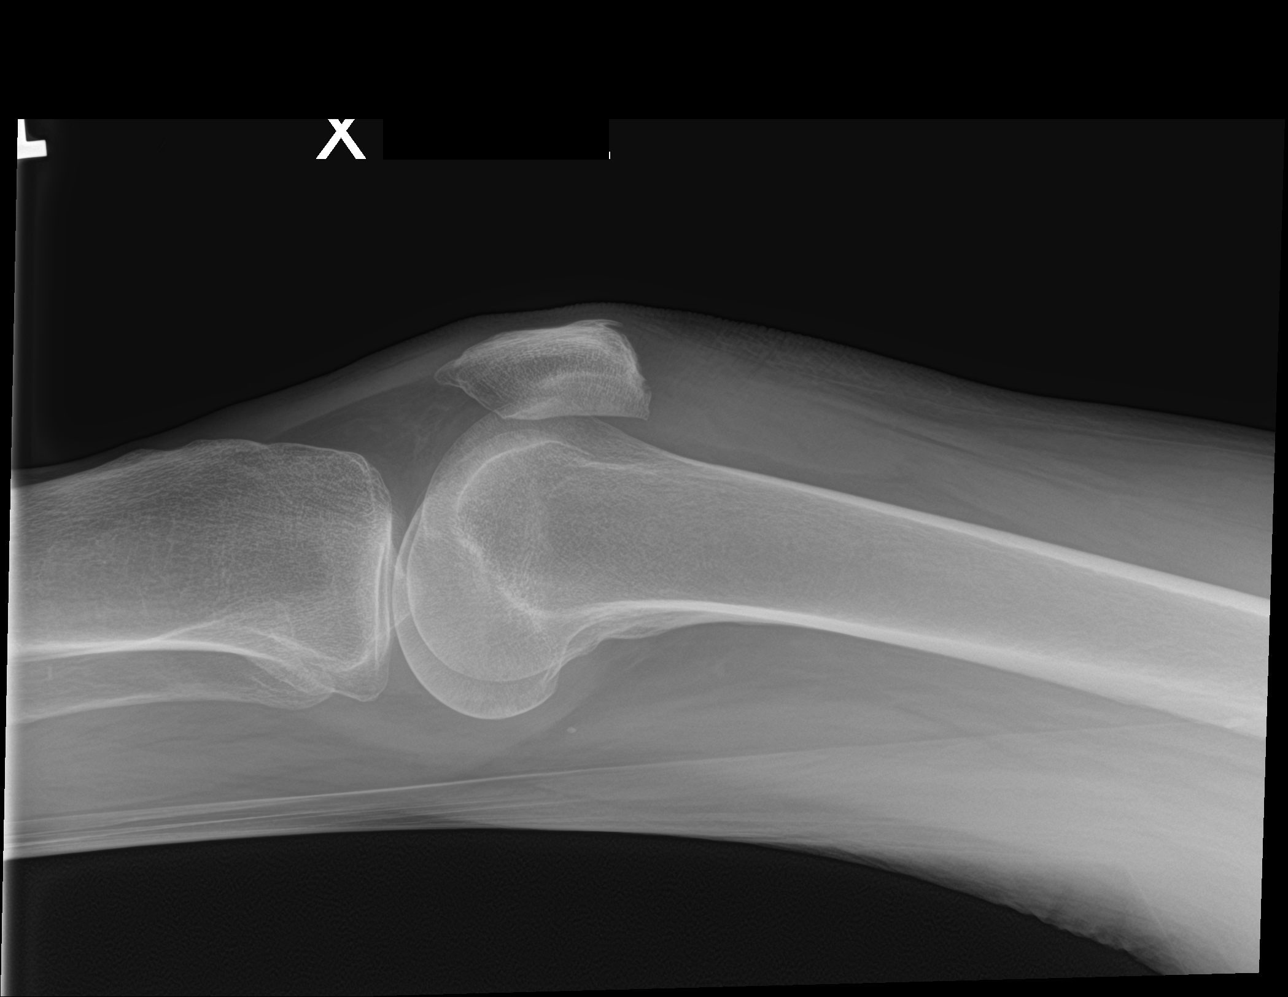

[knee ap (2 of 3)]
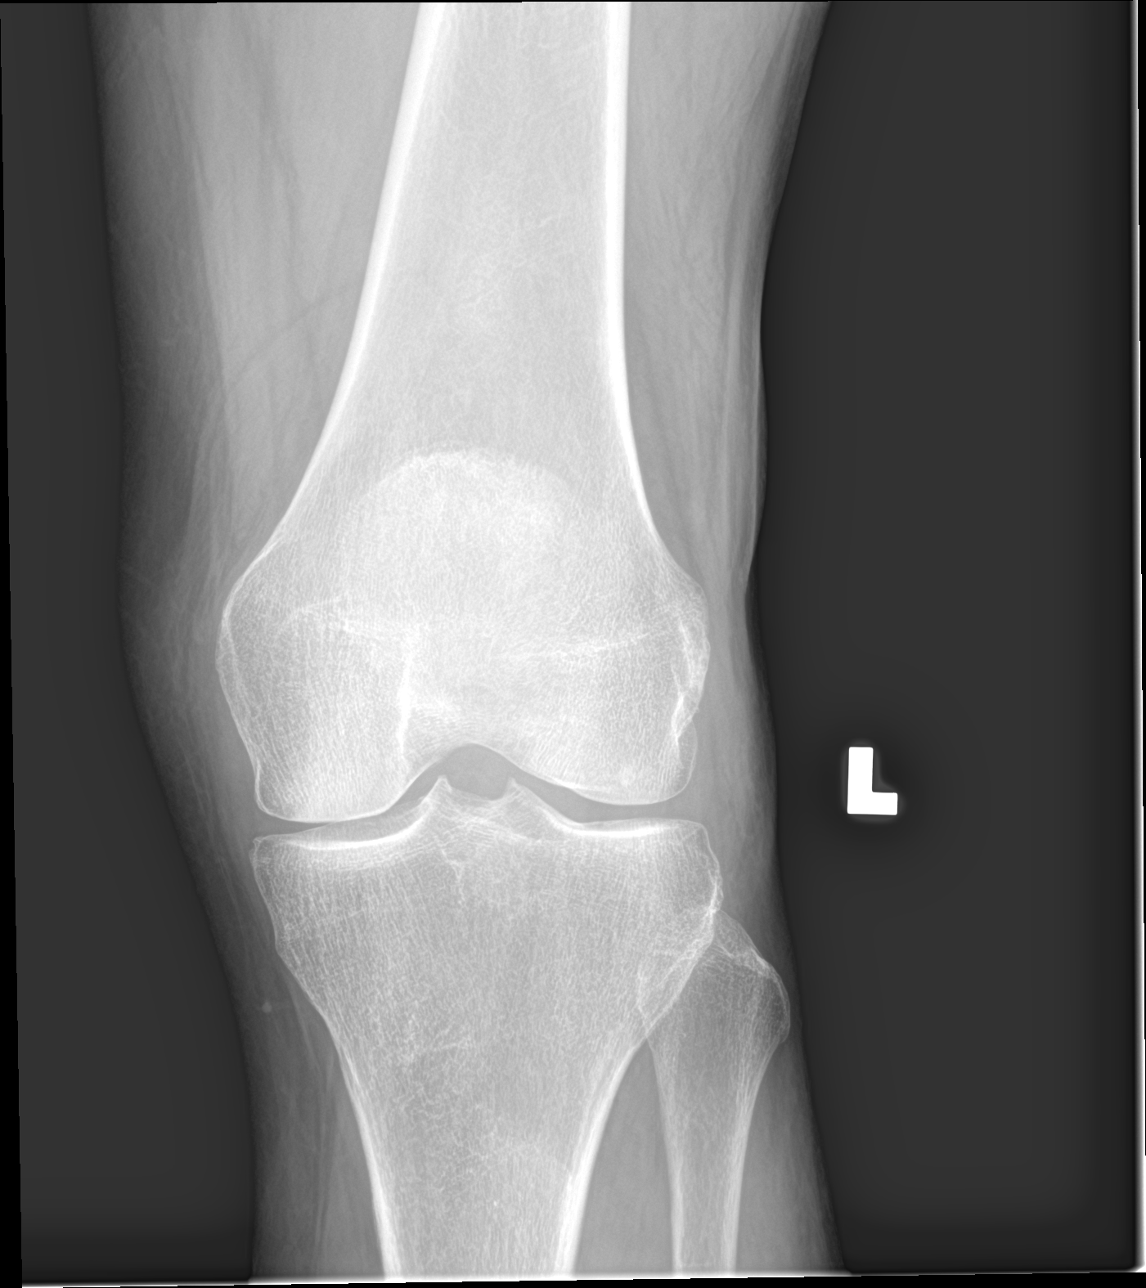

[knee ap (3 of 3)]
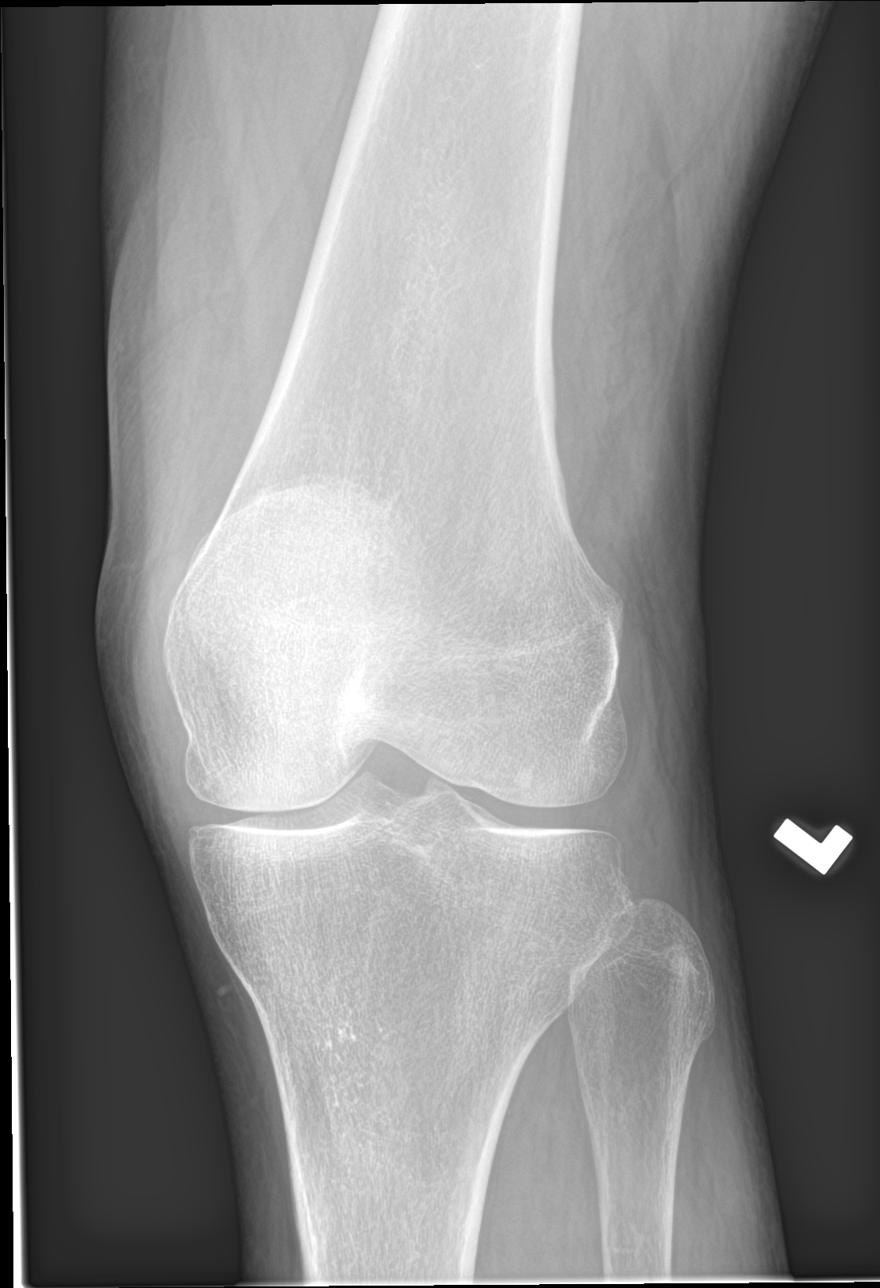

[4 of 4 positions shown; findings below may reference images not displayed]

FINDINGS: Small-bowel anterior patellar enthesophyte. Tiny tibial spine spurs.
Minimal medial and posterior patellar spur formation. No fracture,
dislocation or effusion.
IMPRESSION: 1. No fracture.
2. Minimal degenerative changes.

## 2019-08-14 IMAGING — DX DG WRIST COMPLETE 3+V*L*
4 series · 4 of 4 positions shown · non-contrast
Comparison: 01/24/2012 and left hand dated 06/26/2013.

CLINICAL DATA: Left wrist pain following a fall this morning.

EXAM:
LEFT WRIST - COMPLETE 3+ VIEW

[wrist pa]
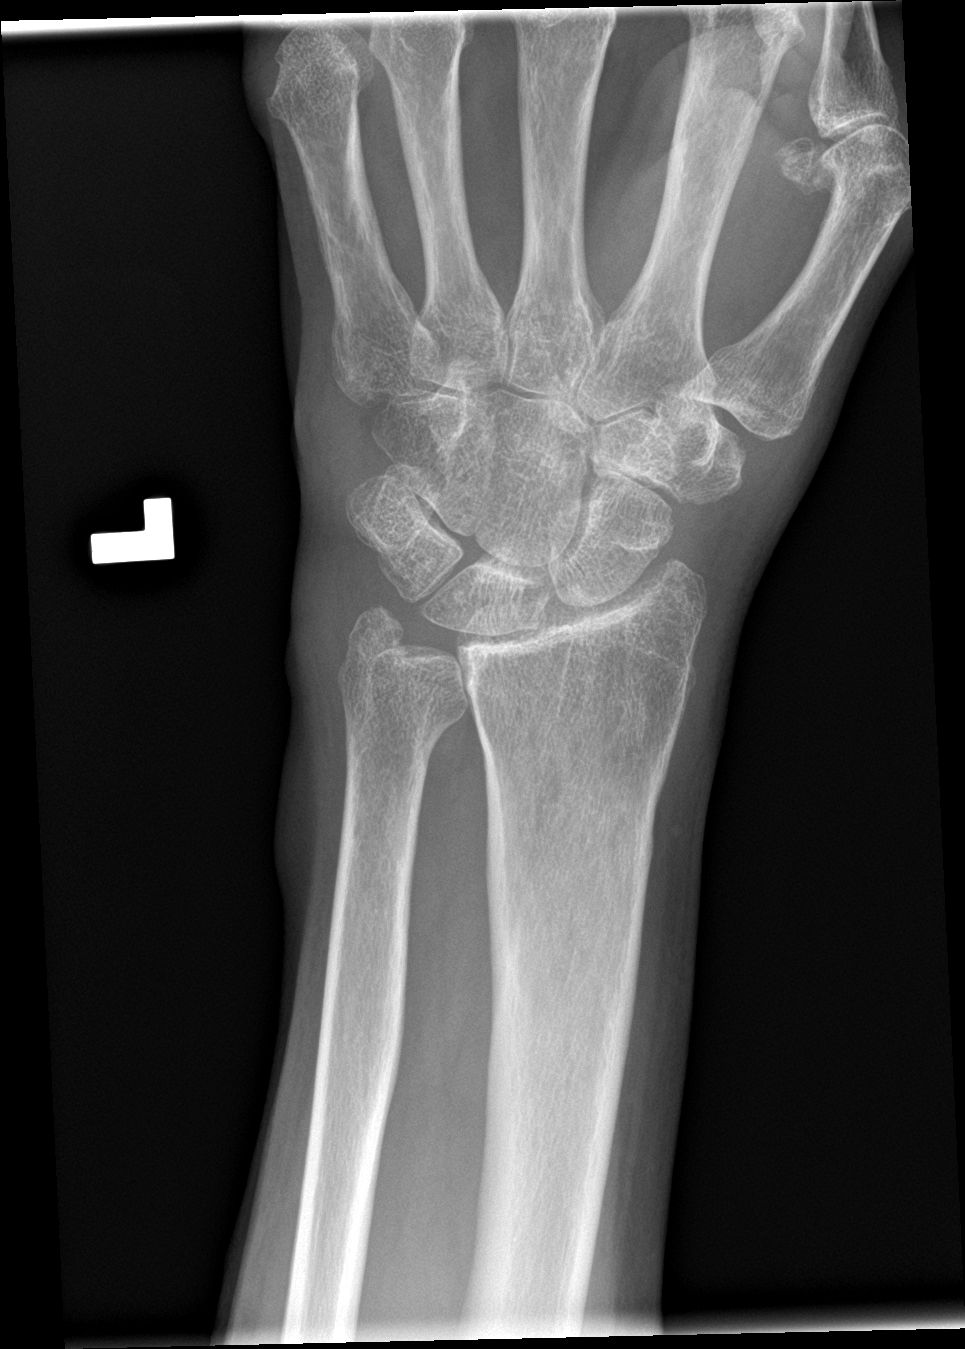

[wrist obl]
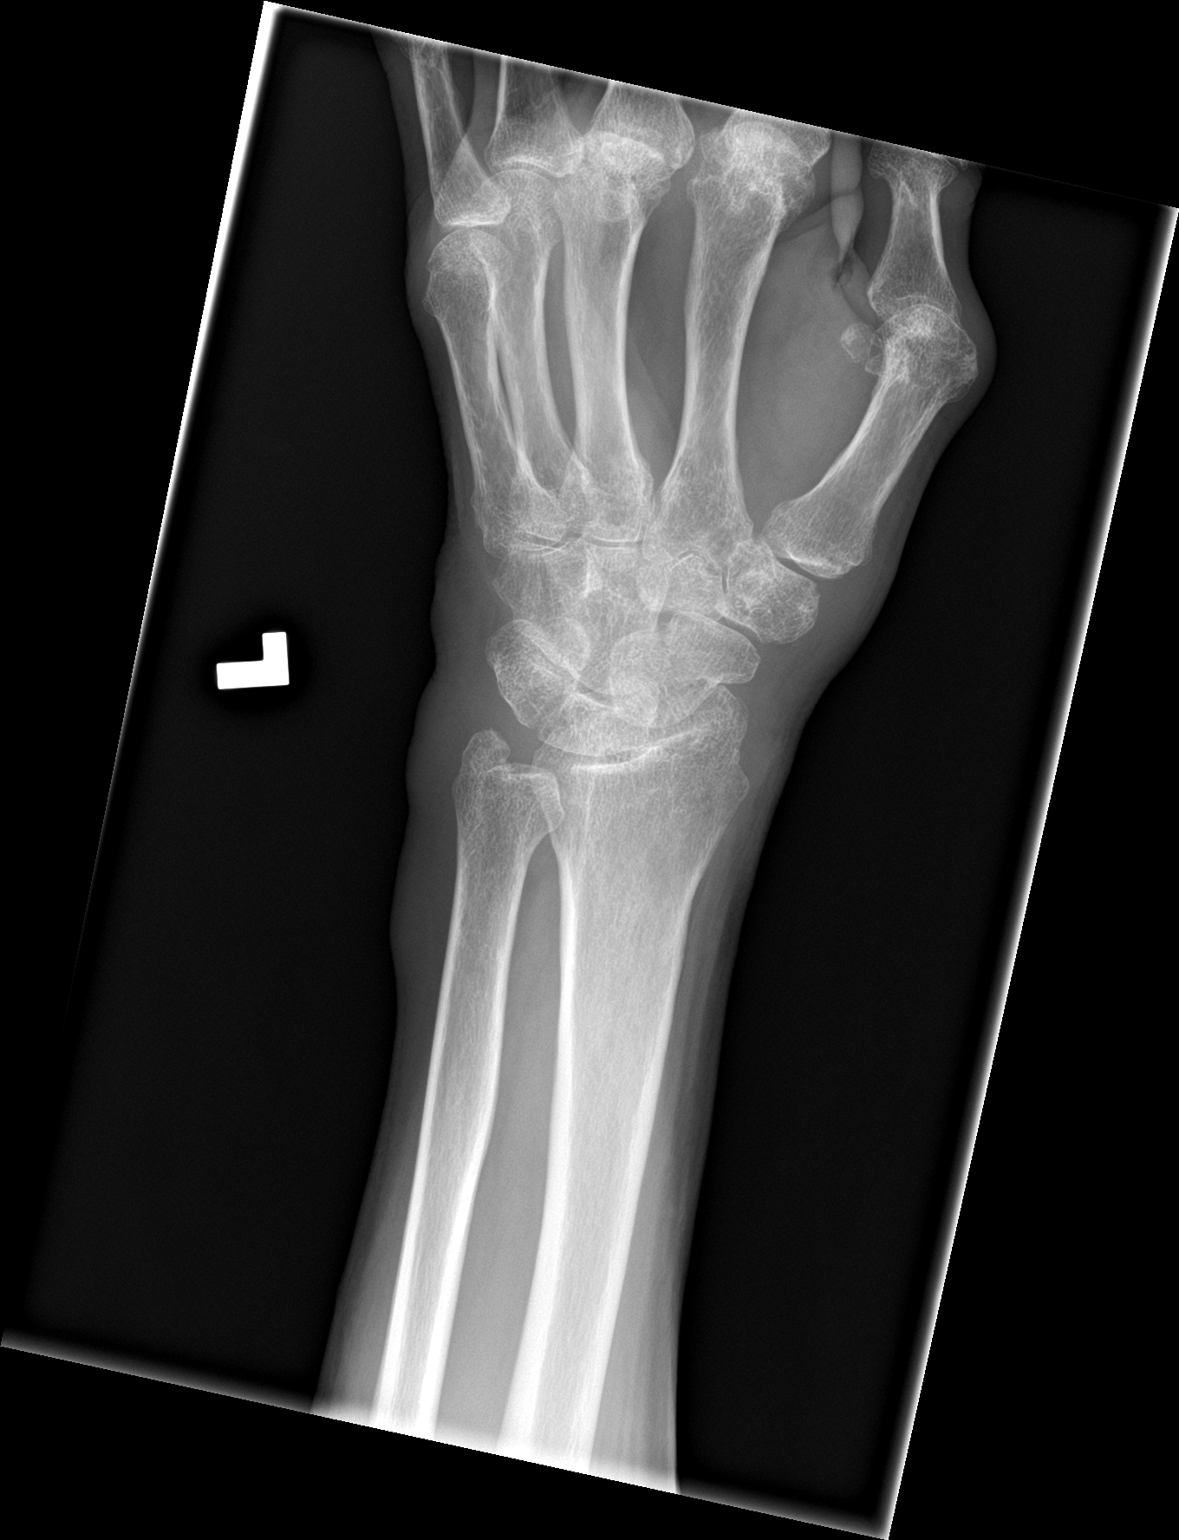

[wrist lat]
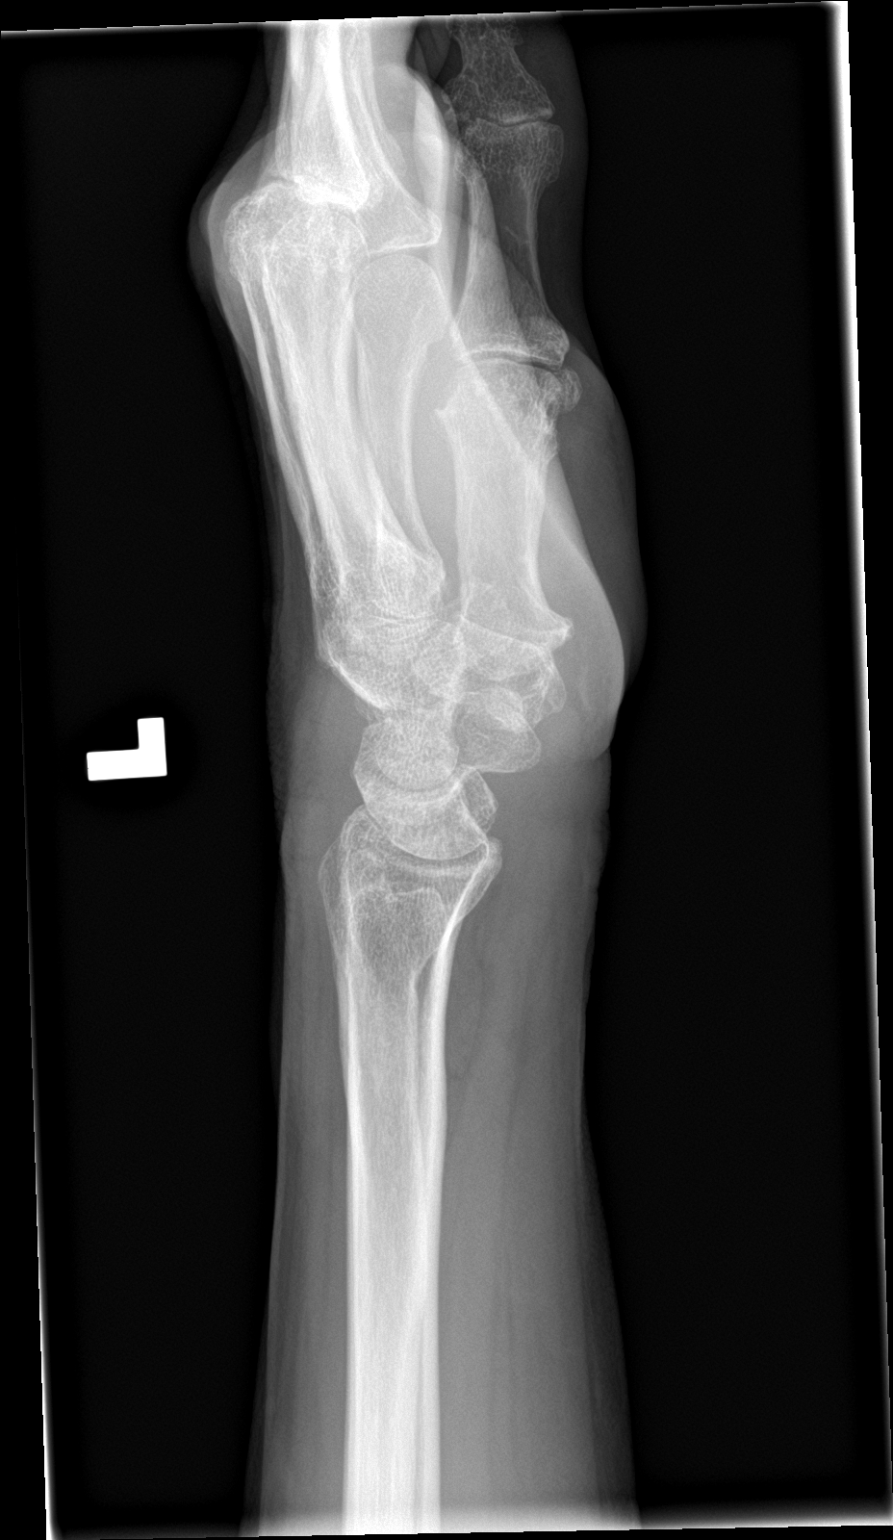

[wrist navicular]
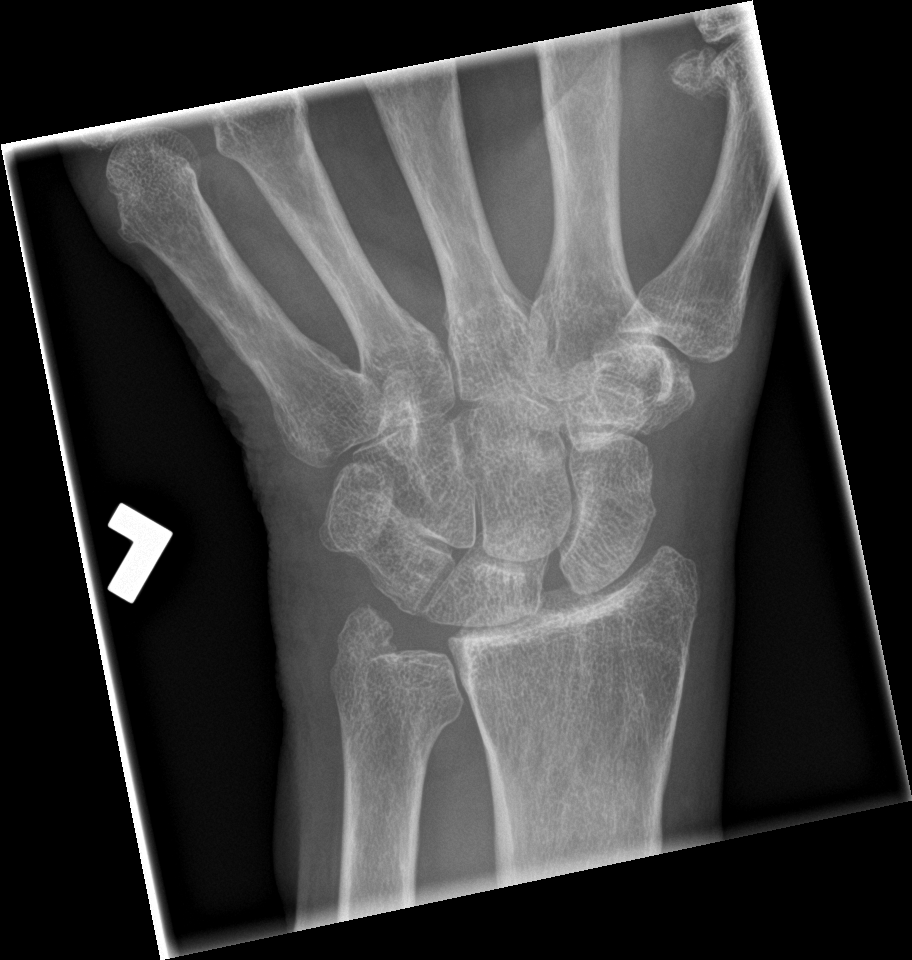

[4 of 4 positions shown; findings below may reference images not displayed]

FINDINGS: Stable widening of the scapholunate space. Mild radiocarpal joint
space narrowing. No fracture or dislocation seen.
IMPRESSION: 1. No fracture.
2. Stable widening of the scapholunate space suggesting previous
ligamentous injury.

## 2019-08-14 IMAGING — DX DG KNEE COMPLETE 4+V*R*
4 series · 4 of 4 positions shown · non-contrast
Comparison: 05/10/2017.

CLINICAL DATA: Right knee pain following a fall this morning.

EXAM:
RIGHT KNEE - COMPLETE 4+ VIEW

[knee ap (1 of 3)]
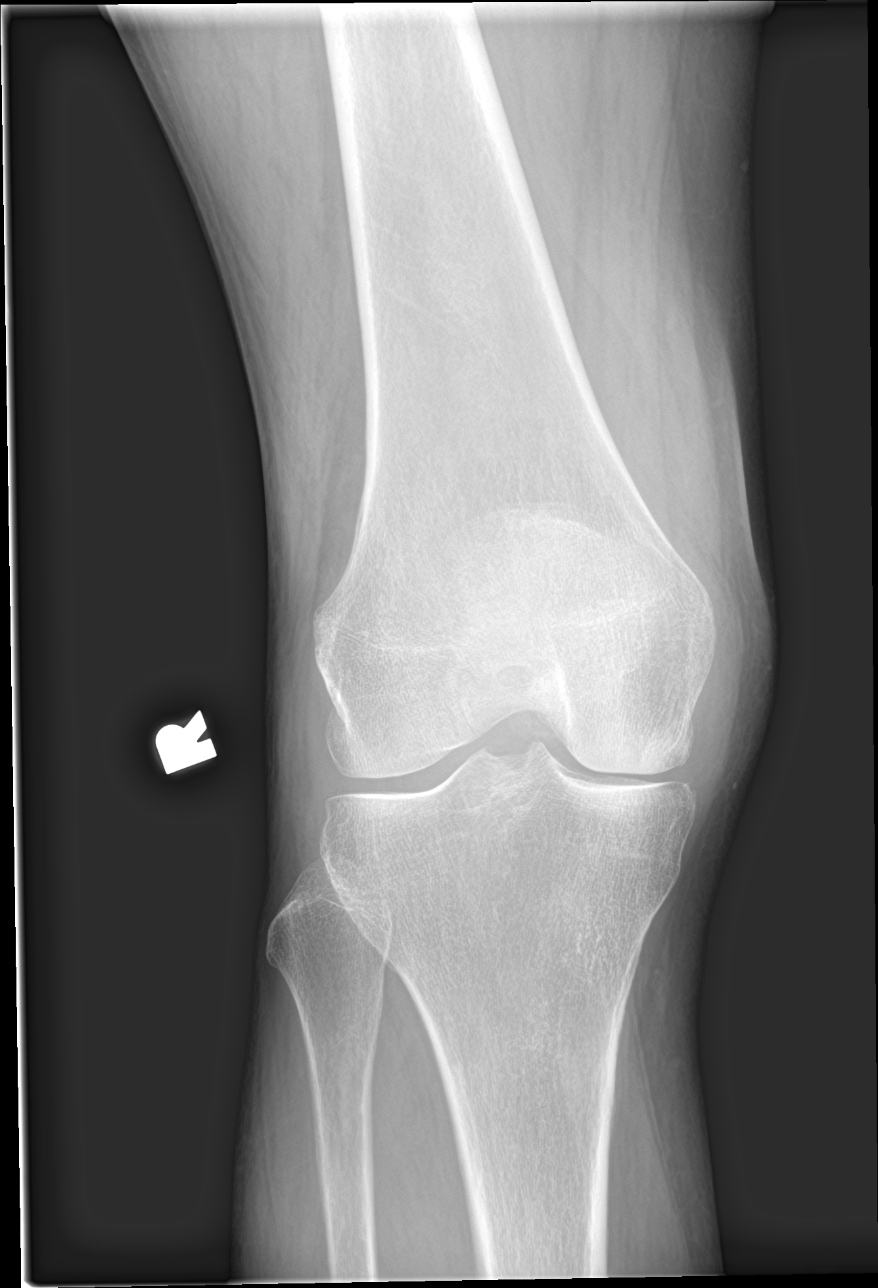

[knee lat]
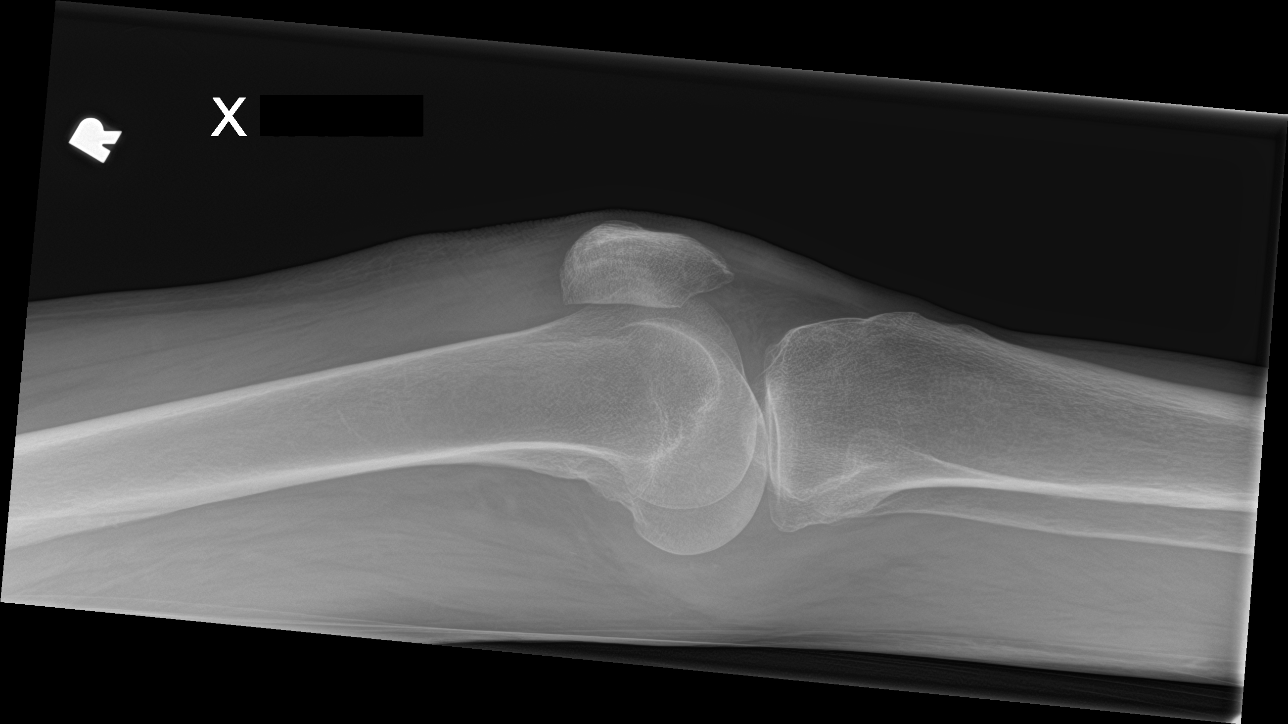

[knee ap (2 of 3)]
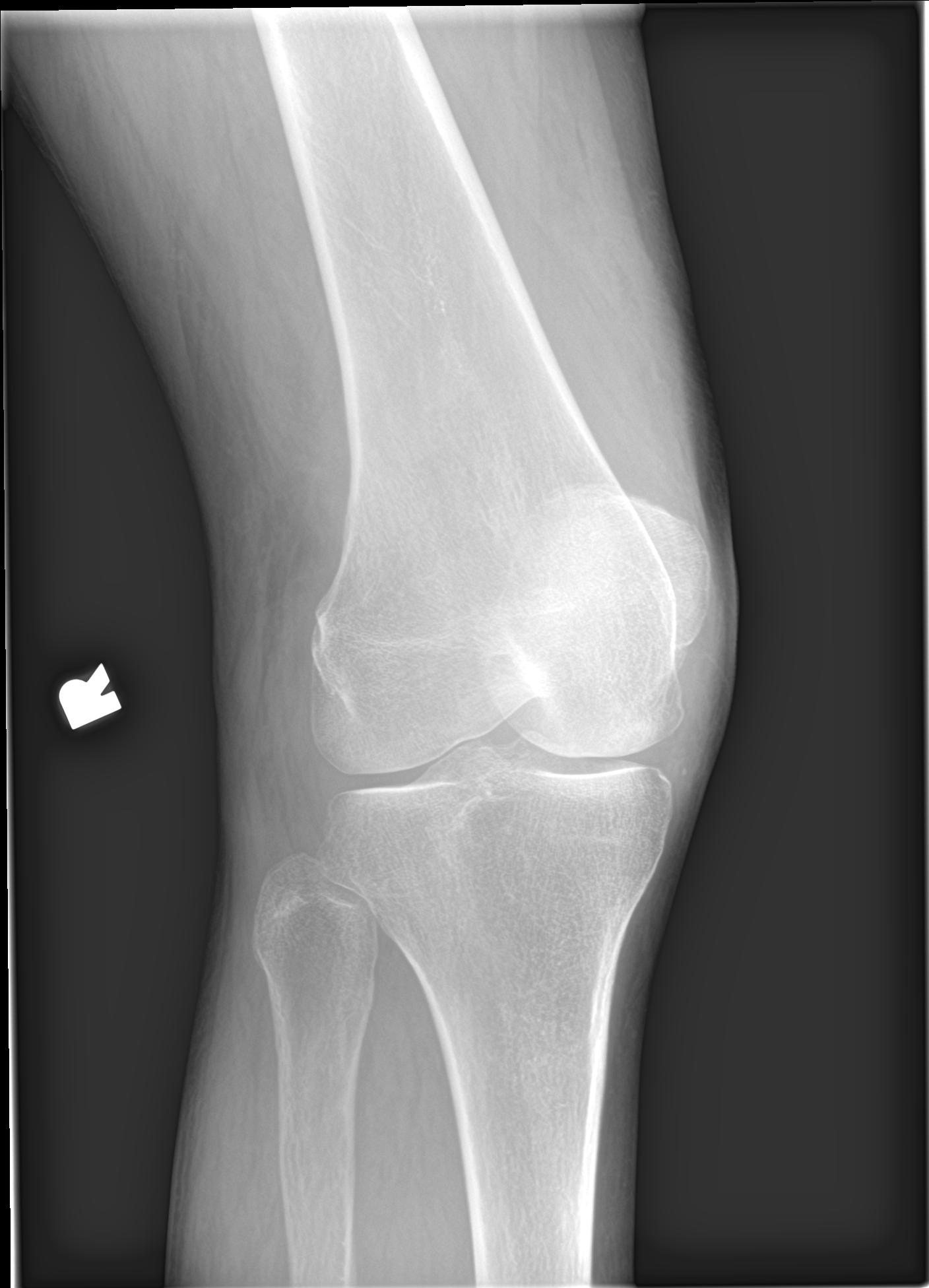

[knee ap (3 of 3)]
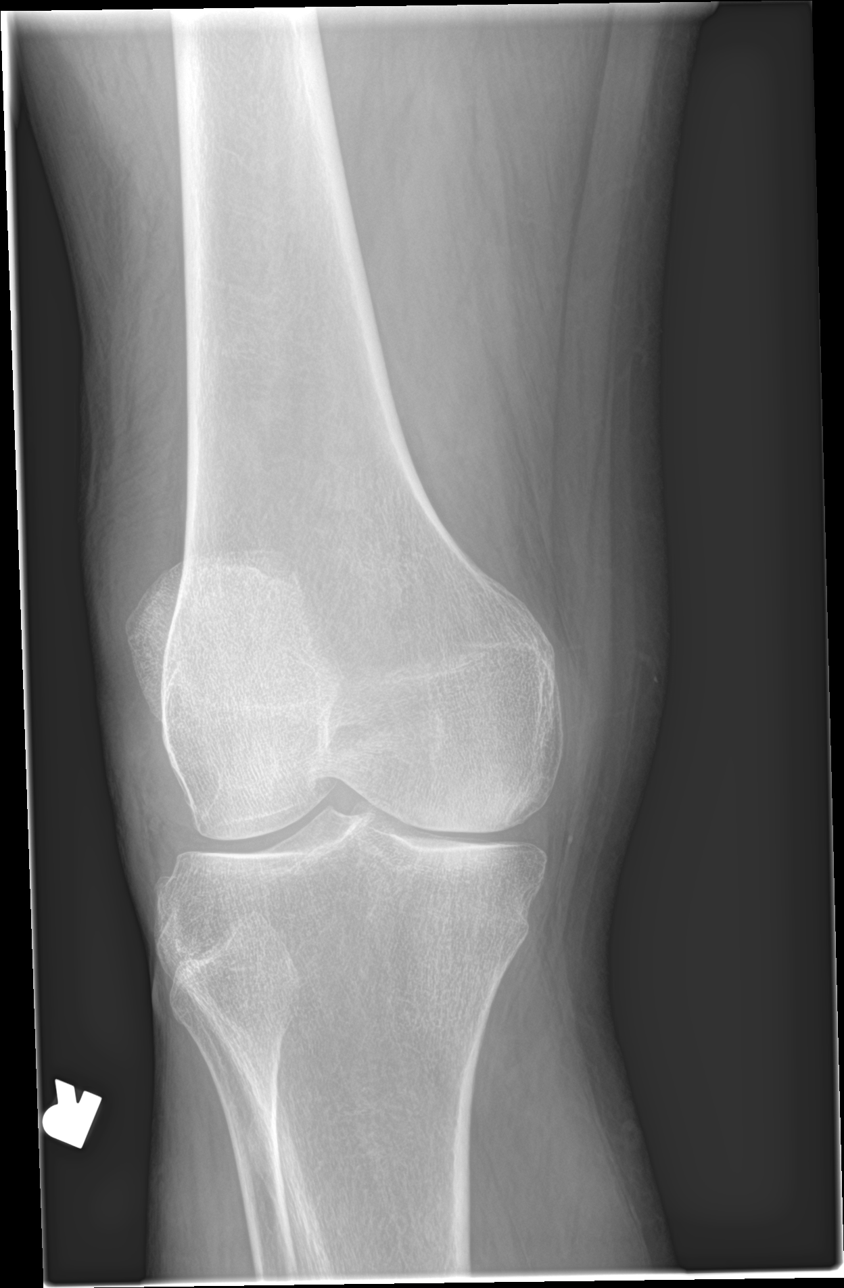

[4 of 4 positions shown; findings below may reference images not displayed]

FINDINGS: Again demonstrated is moderate medial joint space narrowing. No
fracture, dislocation or effusion.
IMPRESSION: Stable moderate medial joint space narrowing without fracture.

## 2019-08-28 ENCOUNTER — Encounter: Payer: Self-pay | Admitting: Gastroenterology

## 2019-08-31 IMAGING — DX DG KNEE COMPLETE 4+V*L*
4 series · 4 of 4 positions shown · non-contrast
Comparison: Left knee series dated August 01, 2017.

CLINICAL DATA: Patient fell onto her left knee yesterday and has
lateral swelling and anterolateral pain radiating down the leg.
History of rheumatoid arthritis and osteoporosis.

EXAM:
LEFT KNEE - COMPLETE 4+ VIEW

[knee ap]
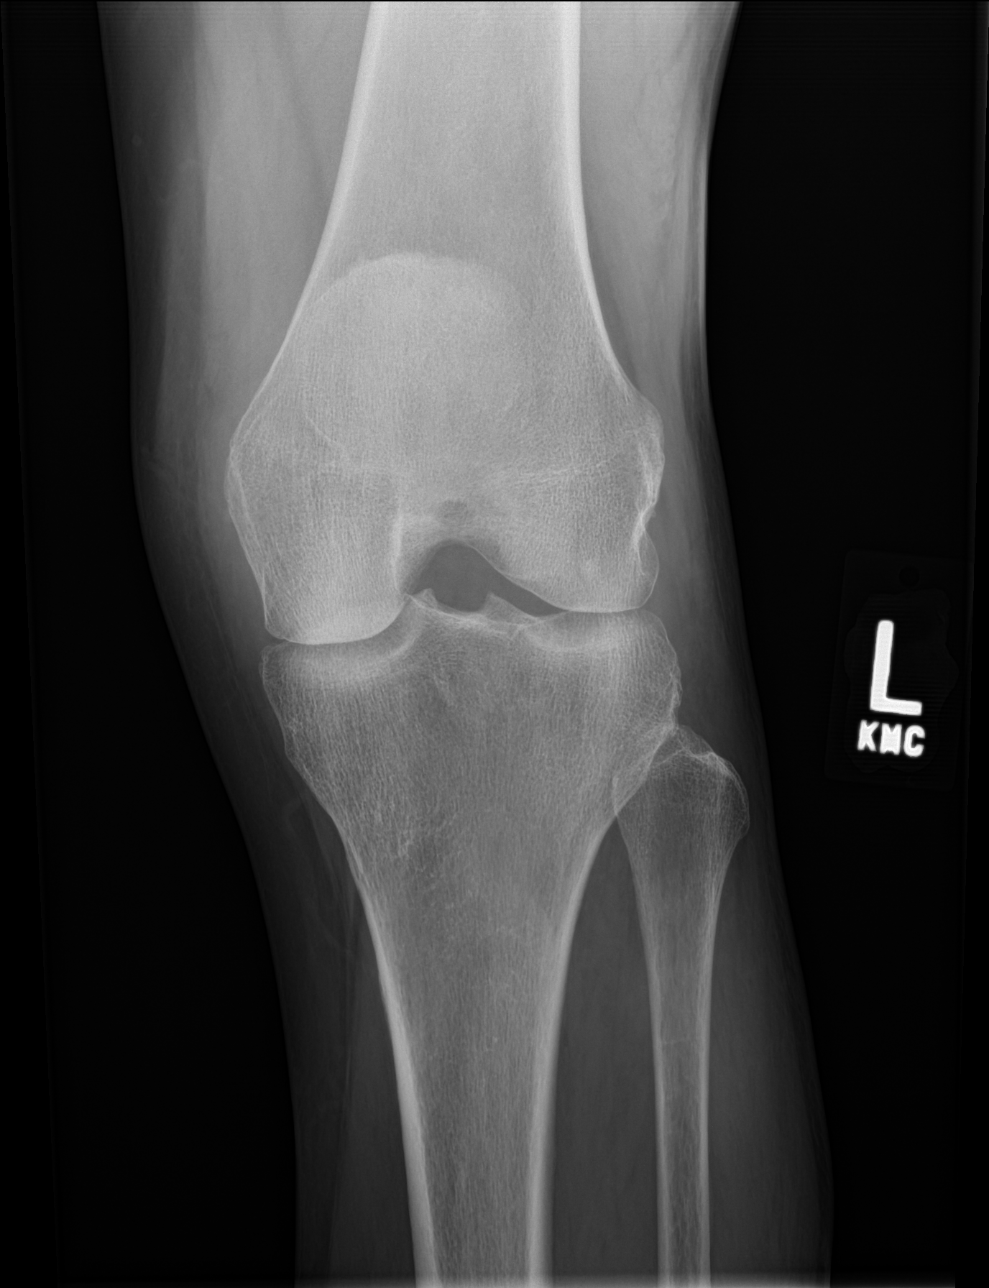

[knee obl (1 of 2)]
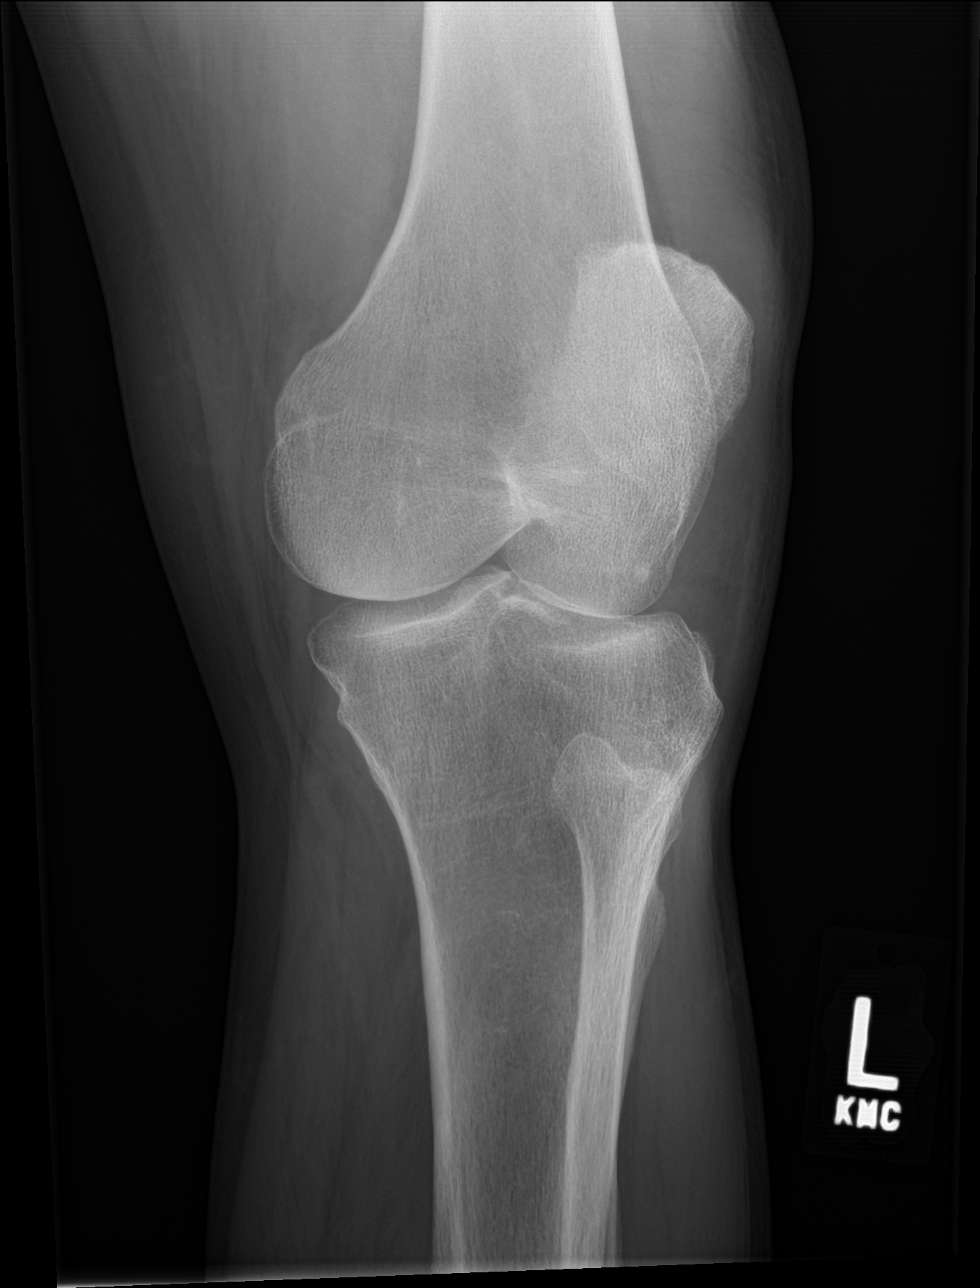

[knee obl (2 of 2)]
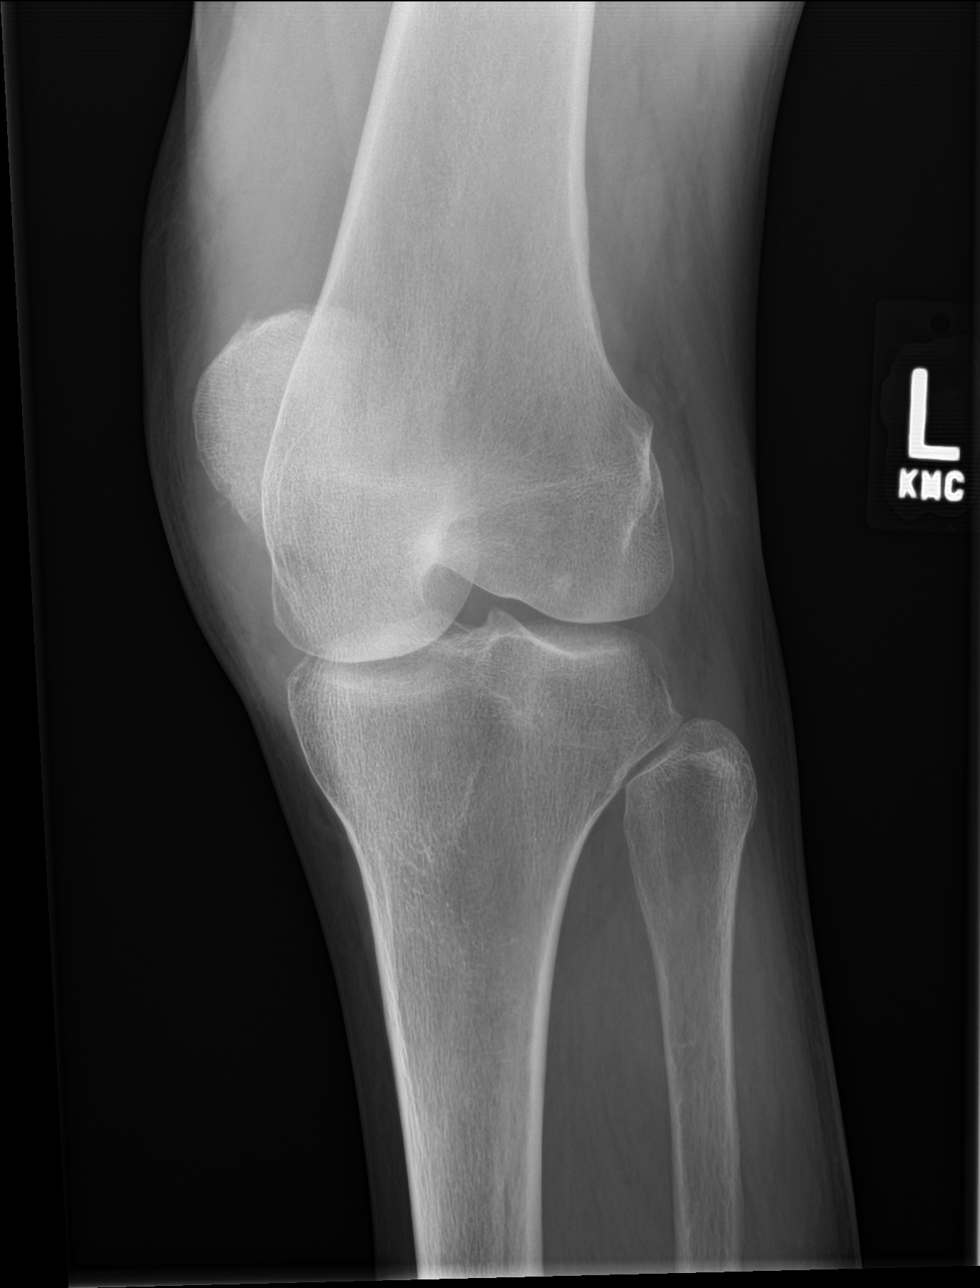

[knee lat]
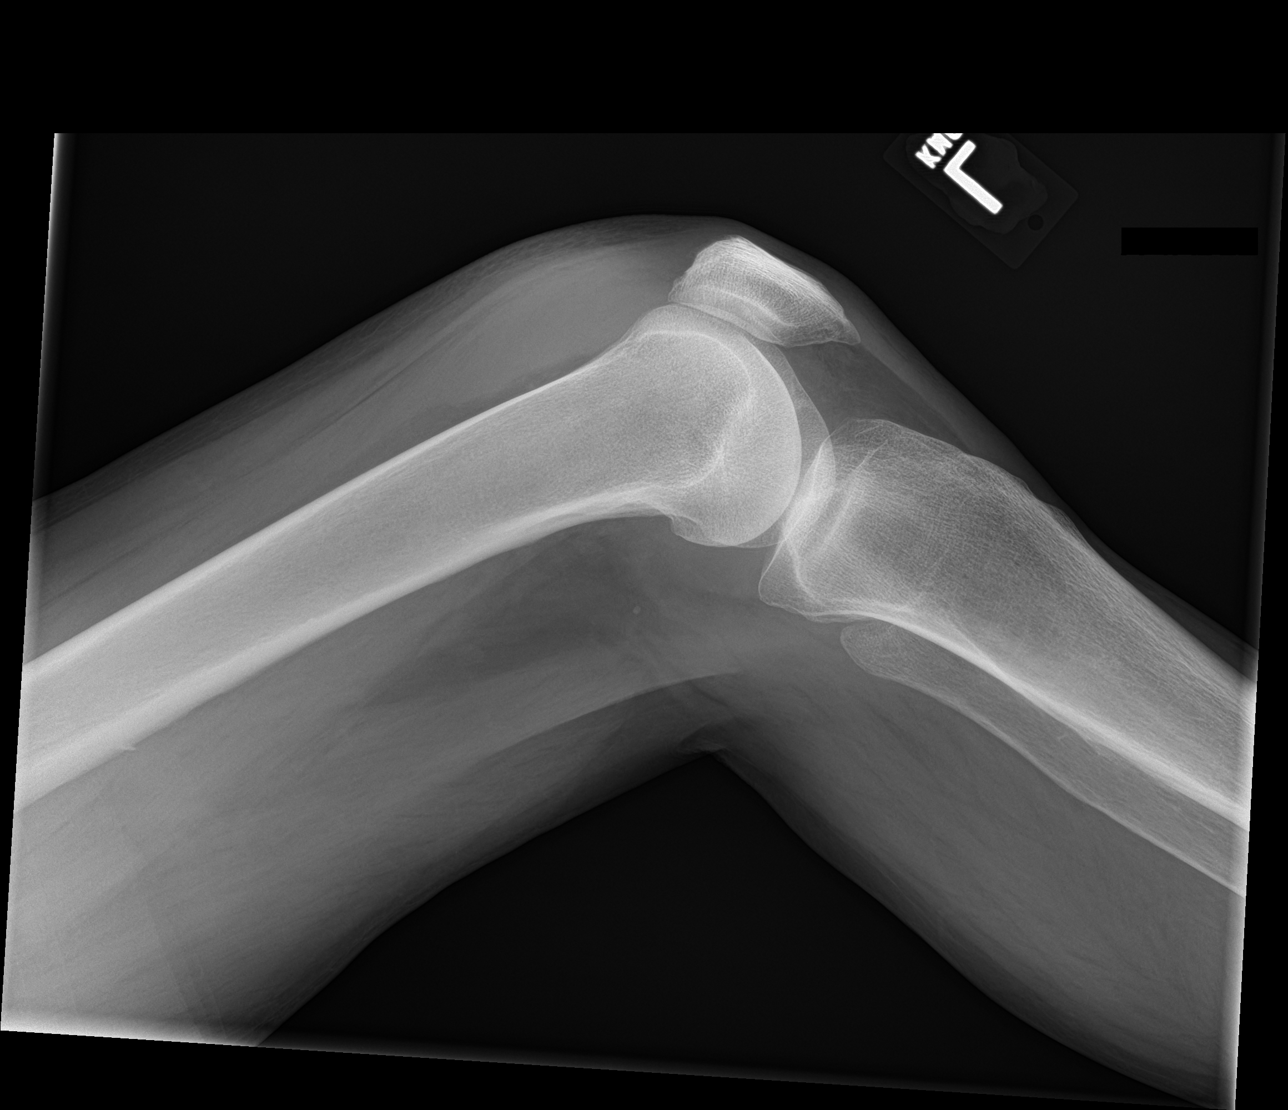

[4 of 4 positions shown; findings below may reference images not displayed]

FINDINGS: The bones are subjectively adequately mineralized. There is no lytic
nor blastic lesion. No erosive or proliferative changes are
observed. Minimal beaking of the tibial spines is noted. The joint
spaces are reasonably well-maintained. There is a moderate-sized
suprapatellar effusion.
IMPRESSION: There is no acute fracture or dislocation. There is no more than
minimal degenerative change. There is a moderate-sized suprapatellar
effusion.

## 2019-09-08 IMAGING — DX DG CHEST 2V
2 series · 2 of 2 positions shown · non-contrast
Comparison: 12/23/2014

CLINICAL DATA: Short of breath

EXAM:
CHEST - 2 VIEW

[chest pa]
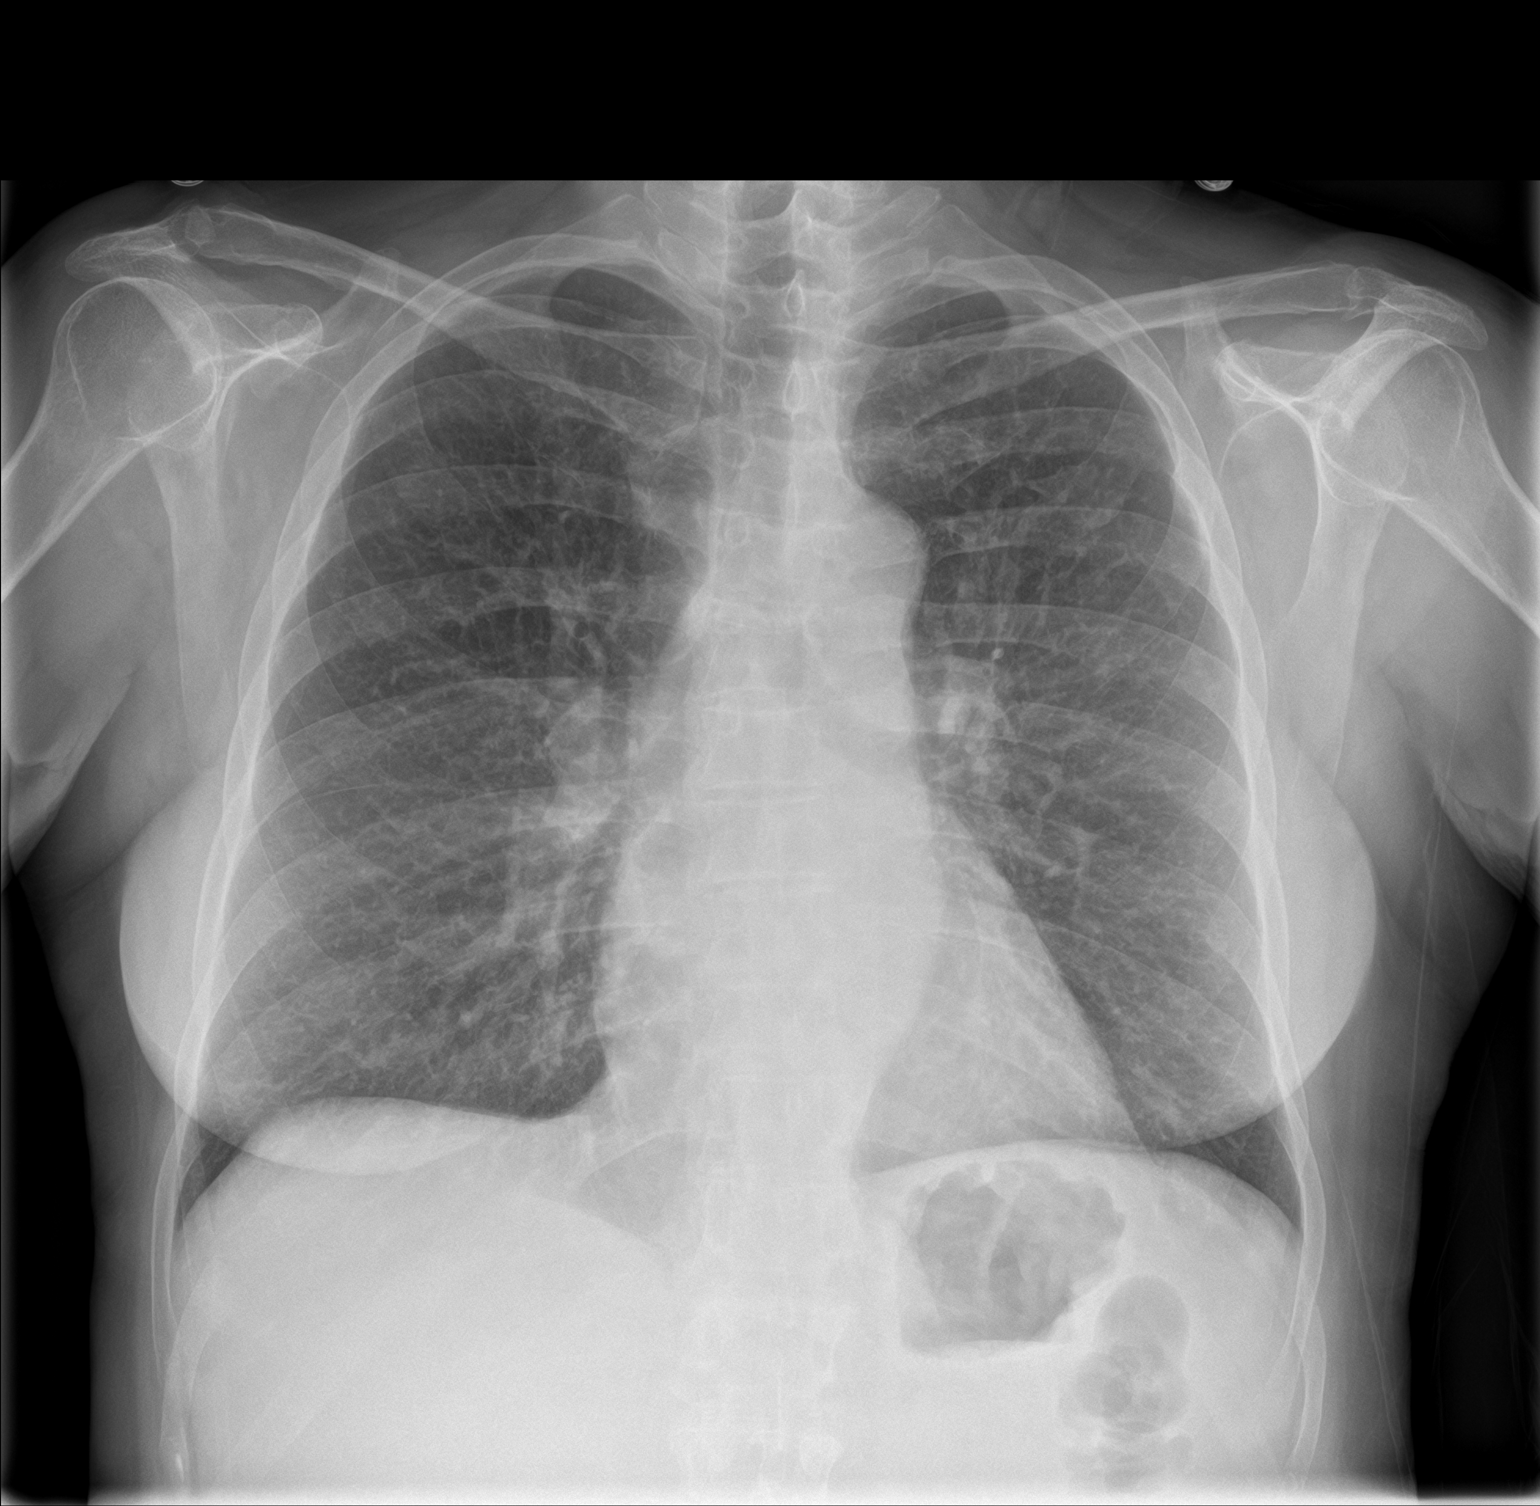

[chest lat]
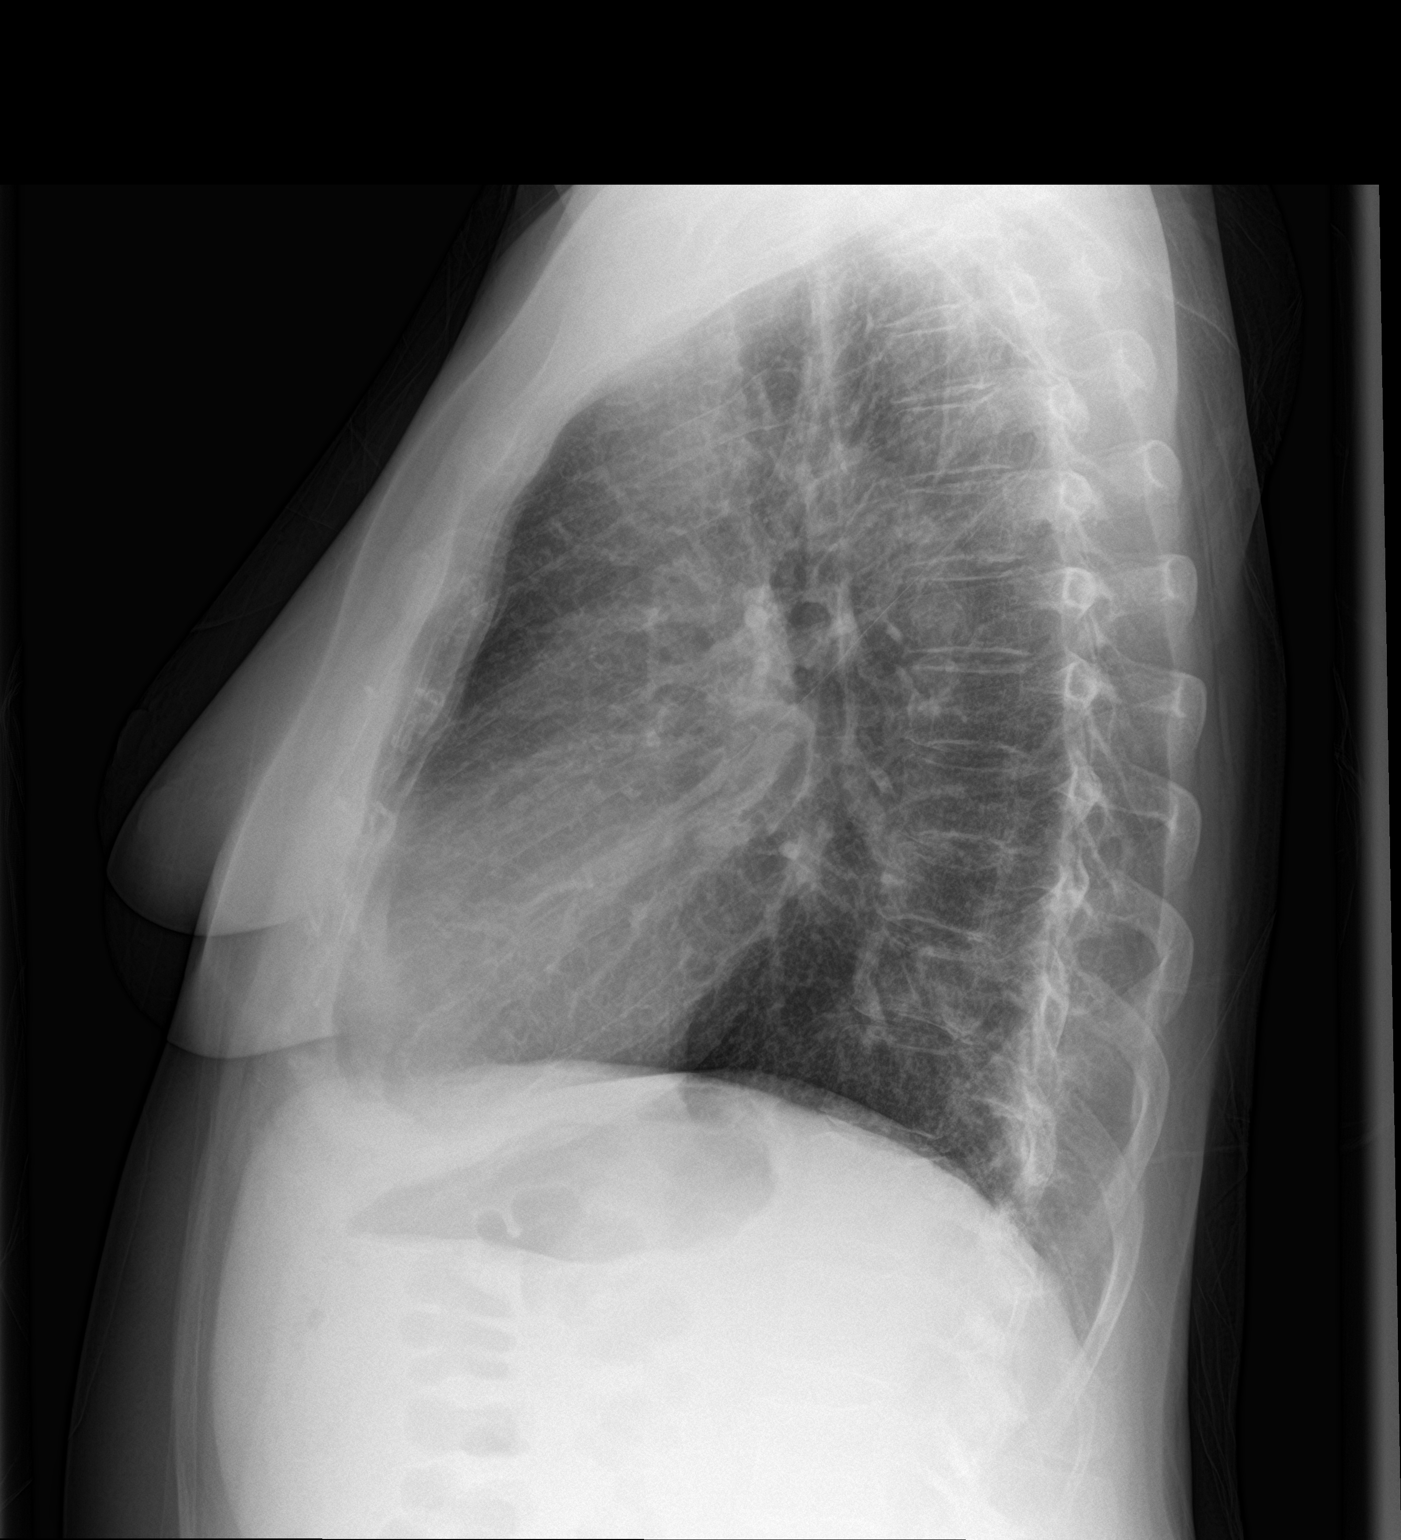

[2 of 2 positions shown; findings below may reference images not displayed]

FINDINGS: Mild diffuse interstitial opacity most likely chronic change. No
acute consolidation or effusion. Normal cardiomediastinal
silhouette. No pneumothorax.
IMPRESSION: No active cardiopulmonary disease.

## 2019-09-08 IMAGING — CT CT ABD-PELV W/ CM
2 of 4 series · 16 of 46 positions shown, 18 images · IV contrast (Isovue)
Comparison: 02/07/2015

CLINICAL DATA: Abdominal pain for several days

EXAM:
CT ABDOMEN AND PELVIS WITH CONTRAST
TECHNIQUE: Multidetector CT imaging of the abdomen and pelvis was performed
using the standard protocol following bolus administration of
intravenous contrast.
CONTRAST:  100mL 6C19DH-LYY IOPAMIDOL (6C19DH-LYY) INJECTION 61%

[Series 2: axial st · axial · 0.76mm/px · z∈[+866,+1261]mm · 13 of 87 slices shown, 15 images]
[im 4/87  soft-tissue]
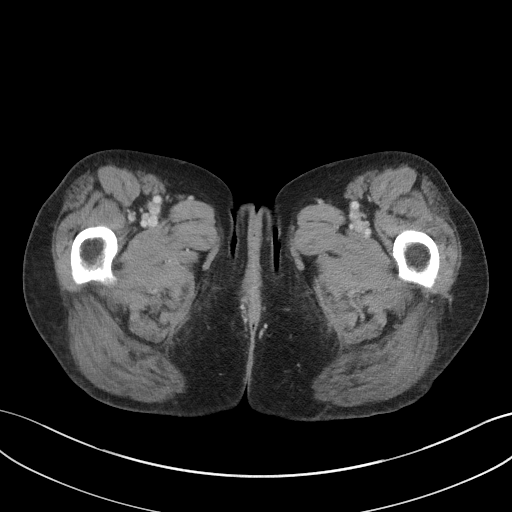
[im 4/87  bone]
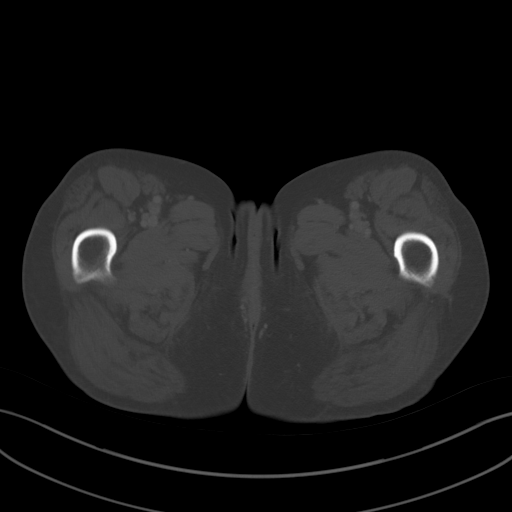
[im 12/87  soft-tissue]
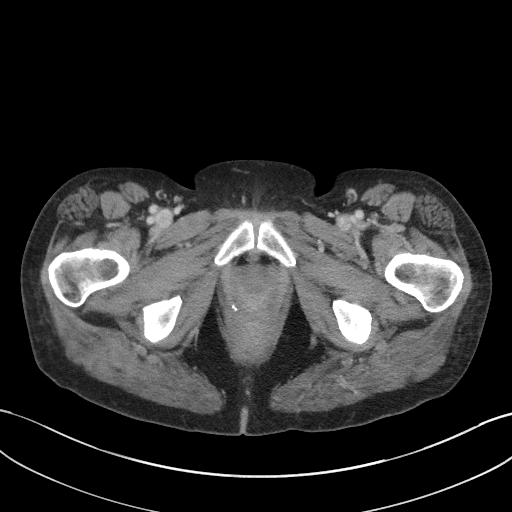
[im 20/87  soft-tissue]
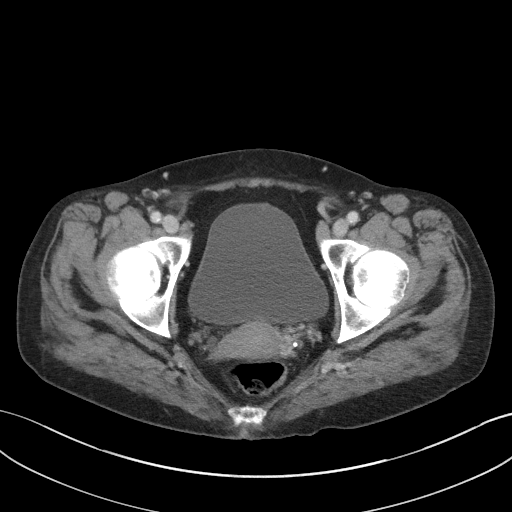
[im 24/87  soft-tissue]
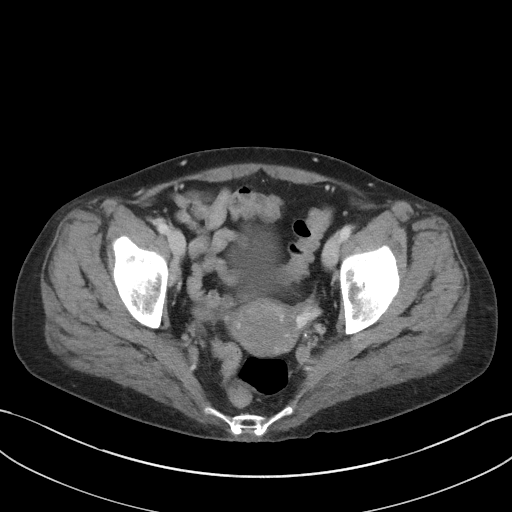
[im 32/87  soft-tissue]
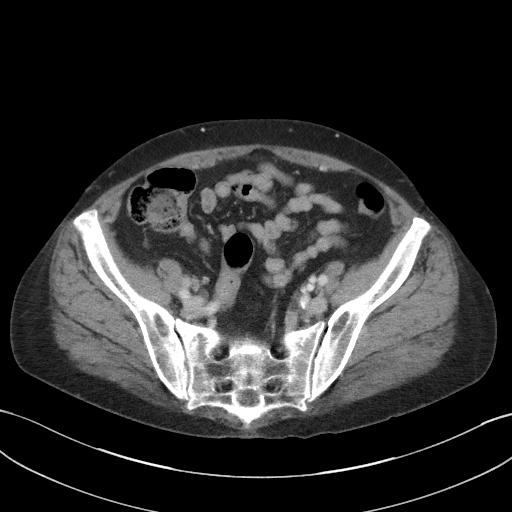
[im 36/87  soft-tissue]
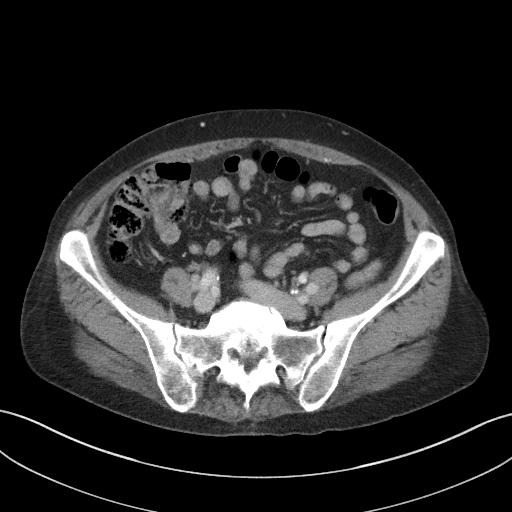
[im 44/87  soft-tissue]
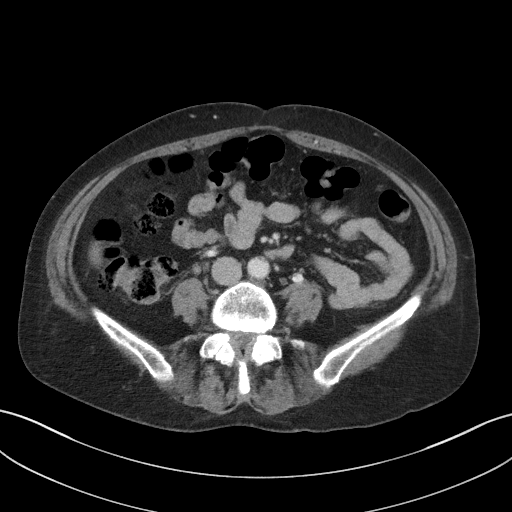
[im 51/87  soft-tissue]
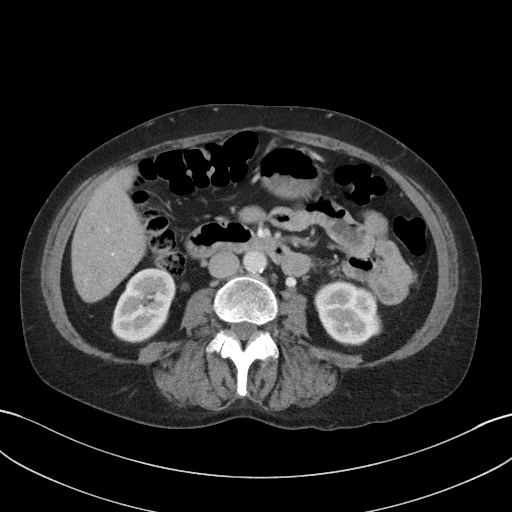
[im 55/87  soft-tissue]
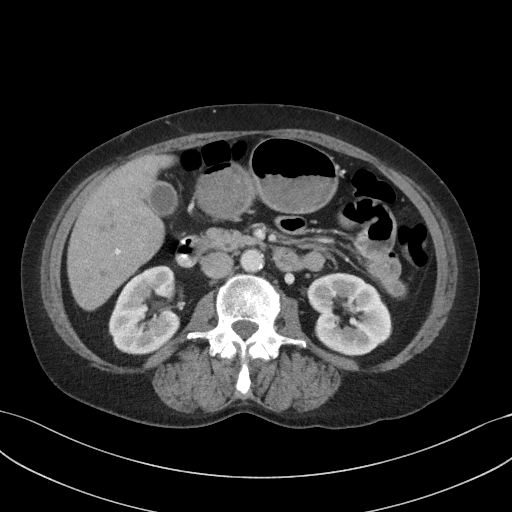
[im 55/87  bone]
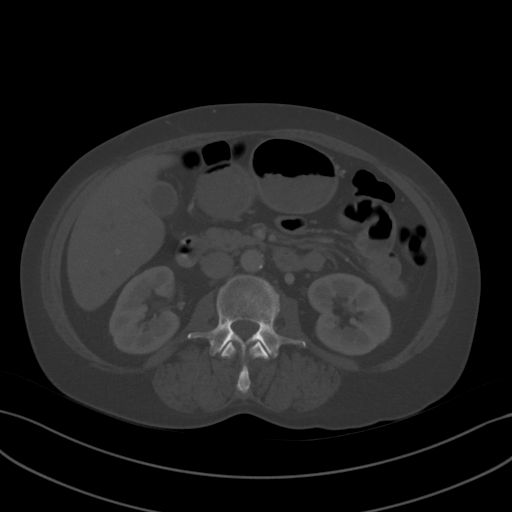
[im 63/87  soft-tissue]
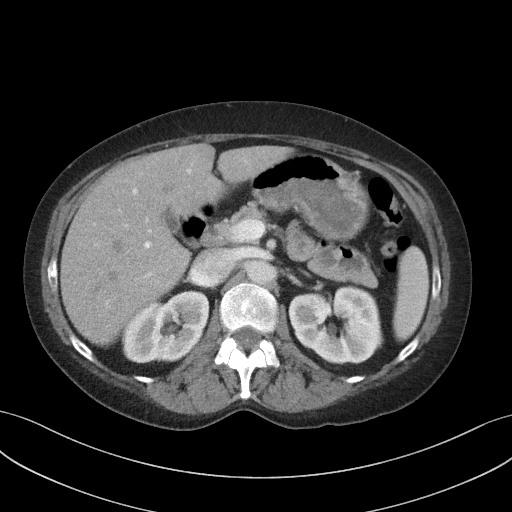
[im 67/87  soft-tissue]
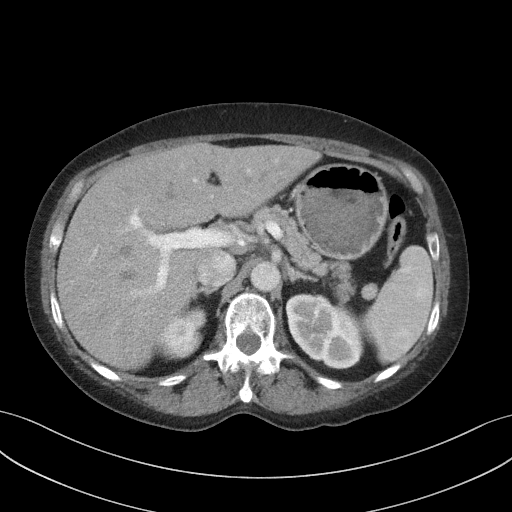
[im 75/87  soft-tissue]
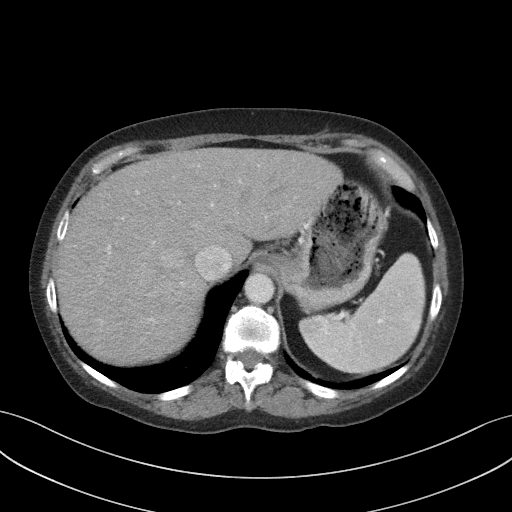
[im 83/87  soft-tissue]
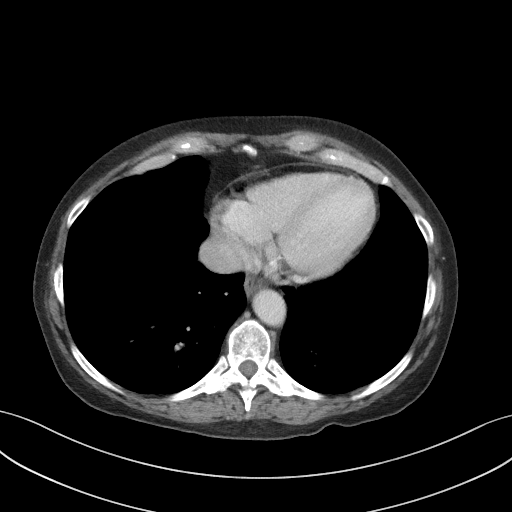

[Series 5: coronal st · coronal · 0.75mm/px · 3 of 85 slices shown]
[im 29/85  soft-tissue]
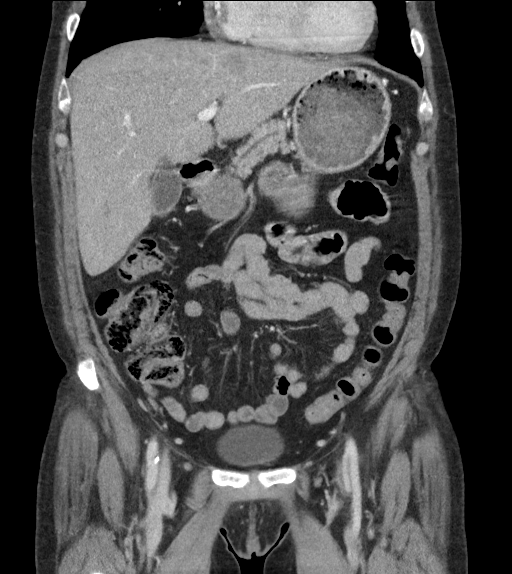
[im 38/85  soft-tissue]
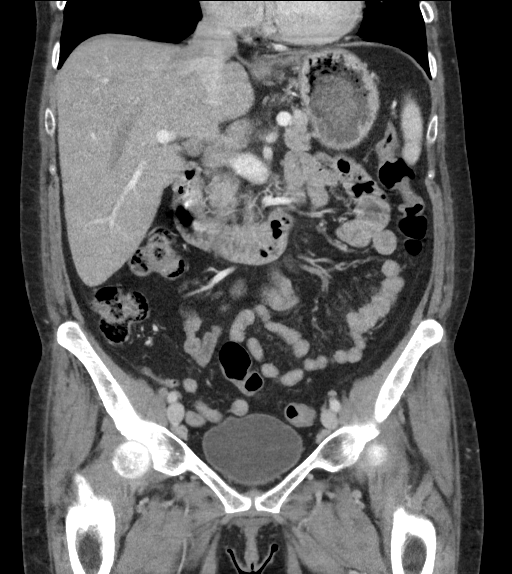
[im 47/85  soft-tissue]
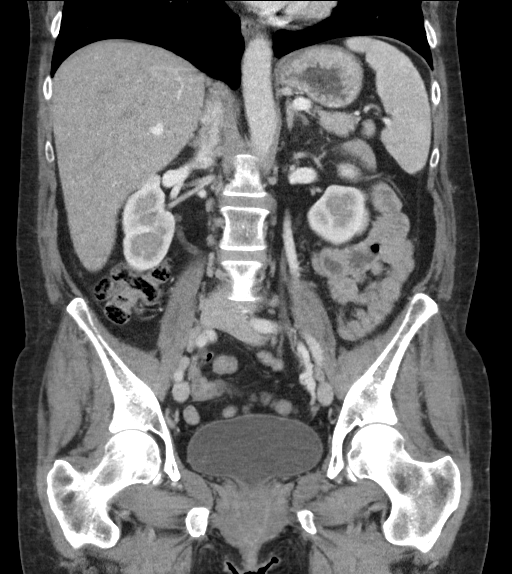

[16 of 46 positions shown; findings below may reference images not displayed]

FINDINGS: Lower chest: No acute abnormality.

Hepatobiliary: Mild fatty infiltration of the liver is noted. No
mass is noted. The gallbladder is decompressed.

Pancreas: Unremarkable. No pancreatic ductal dilatation or
surrounding inflammatory changes.

Spleen: Normal in size without focal abnormality.

Adrenals/Urinary Tract: Adrenal glands are within normal limits. The
kidneys are well visualized bilaterally. No renal calculi or
obstructive changes are noted.

Stomach/Bowel: Scattered diverticular change of the colon is noted
without diverticulitis. No obstructive or inflammatory changes are
seen. The appendix is within normal limits. Small bowel is
unremarkable.

Vascular/Lymphatic: Aortic atherosclerosis. No enlarged abdominal or
pelvic lymph nodes.

Reproductive: Uterus and bilateral adnexa are unremarkable.

Other: No abdominal wall hernia or abnormality. No abdominopelvic
ascites.

Musculoskeletal: Old right inferior pubic ramus fracture with
healing is noted. Previously seen sacral insufficiency fracture is
healed in the interval. Degenerative changes of the lumbar spine are
noted.
IMPRESSION: Chronic changes as described above without acute abnormality.
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
## 2019-10-20 ENCOUNTER — Other Ambulatory Visit (HOSPITAL_COMMUNITY)
Admission: RE | Admit: 2019-10-20 | Discharge: 2019-10-20 | Disposition: A | Discharge status: Home / Self Care | Payer: Medicaid Other | Source: Ambulatory Visit | Attending: Gastroenterology | Admitting: Gastroenterology

## 2019-10-20 ENCOUNTER — Ambulatory Visit (INDEPENDENT_AMBULATORY_CARE_PROVIDER_SITE_OTHER): Payer: Medicaid Other | Admitting: Gastroenterology

## 2019-10-20 ENCOUNTER — Other Ambulatory Visit: Payer: Self-pay

## 2019-10-20 ENCOUNTER — Encounter: Payer: Self-pay | Admitting: Gastroenterology

## 2019-10-20 ENCOUNTER — Encounter: Payer: Self-pay | Admitting: *Deleted

## 2019-10-20 VITALS — BP 126/79 | HR 81 | Temp 97.1°F | Ht 66.0 in | Wt 155.8 lb

## 2019-10-20 DIAGNOSIS — K219 Gastro-esophageal reflux disease without esophagitis: Secondary | ICD-10-CM | POA: Insufficient documentation

## 2019-10-20 DIAGNOSIS — R768 Other specified abnormal immunological findings in serum: Secondary | ICD-10-CM

## 2019-10-20 DIAGNOSIS — K5900 Constipation, unspecified: Secondary | ICD-10-CM | POA: Diagnosis not present

## 2019-10-20 DIAGNOSIS — D509 Iron deficiency anemia, unspecified: Secondary | ICD-10-CM | POA: Diagnosis not present

## 2019-10-20 LAB — CBC WITH DIFFERENTIAL/PLATELET
Abs Immature Granulocytes: 0.01 10*3/uL (ref 0.00–0.07)
Basophils Absolute: 0 10*3/uL (ref 0.0–0.1)
Basophils Relative: 0 %
Eosinophils Absolute: 0.3 10*3/uL (ref 0.0–0.5)
Eosinophils Relative: 6 %
HCT: 32.5 % — ABNORMAL LOW (ref 36.0–46.0)
Hemoglobin: 10.1 g/dL — ABNORMAL LOW (ref 12.0–15.0)
Immature Granulocytes: 0 %
Lymphocytes Relative: 33 %
Lymphs Abs: 1.9 10*3/uL (ref 0.7–4.0)
MCH: 25.4 pg — ABNORMAL LOW (ref 26.0–34.0)
MCHC: 31.1 g/dL (ref 30.0–36.0)
MCV: 81.7 fL (ref 80.0–100.0)
Monocytes Absolute: 0.4 10*3/uL (ref 0.1–1.0)
Monocytes Relative: 6 %
Neutro Abs: 3.1 10*3/uL (ref 1.7–7.7)
Neutrophils Relative %: 55 %
Platelets: 432 10*3/uL — ABNORMAL HIGH (ref 150–400)
RBC: 3.98 MIL/uL (ref 3.87–5.11)
RDW: 21.3 % — ABNORMAL HIGH (ref 11.5–15.5)
WBC: 5.7 10*3/uL (ref 4.0–10.5)
nRBC: 0 % (ref 0.0–0.2)

## 2019-10-20 LAB — COMPREHENSIVE METABOLIC PANEL
ALT: 16 U/L (ref 0–44)
AST: 22 U/L (ref 15–41)
Albumin: 3.8 g/dL (ref 3.5–5.0)
Alkaline Phosphatase: 85 U/L (ref 38–126)
Anion gap: 9 (ref 5–15)
BUN: 14 mg/dL (ref 6–20)
CO2: 28 mmol/L (ref 22–32)
Calcium: 9 mg/dL (ref 8.9–10.3)
Chloride: 104 mmol/L (ref 98–111)
Creatinine, Ser: 0.64 mg/dL (ref 0.44–1.00)
GFR, Estimated: >60 mL/min (ref >60)
Glucose, Bld: 69 mg/dL — ABNORMAL LOW (ref 70–99)
Potassium: 4.2 mmol/L (ref 3.5–5.1)
Sodium: 141 mmol/L (ref 135–145)
Total Bilirubin: 0.2 mg/dL — ABNORMAL LOW (ref 0.3–1.2)
Total Protein: 7.6 g/dL (ref 6.5–8.1)

## 2019-10-20 LAB — FERRITIN: Ferritin: 10 ng/mL — ABNORMAL LOW (ref 11–307)

## 2019-10-20 LAB — IRON AND TIBC
Iron: 27 ug/dL — ABNORMAL LOW (ref 28–170)
Saturation Ratios: 6 % — ABNORMAL LOW (ref 10.4–31.8)
TIBC: 488 ug/dL — ABNORMAL HIGH (ref 250–450)
UIBC: 461 ug/dL

## 2019-10-20 MED ORDER — OMEPRAZOLE 40 MG PO CPDR: 40.0000 mg | DELAYED_RELEASE_CAPSULE | Freq: Every day | ORAL | 3 refills | Status: AC

## 2019-10-20 MED ORDER — LUBIPROSTONE 24 MCG PO CAPS: 24.0000 ug | ORAL_CAPSULE | Freq: Two times a day (BID) | ORAL | 1 refills | Status: DC

## 2019-10-20 NOTE — Patient Instructions (Addendum)
1. Please go to [facility] for labs. We will be in touch with results as available. 2. RX for omeprazole 40 mg daily before breakfast sent to your pharmacy. 3. Rx for Amitiza take 24 mcg with food 1-2 times daily for constipation as needed. 4. Colonoscopy as scheduled.  Please see separate instructions.

## 2019-10-20 NOTE — Progress Notes (Signed)
Primary Care Physician:  Zhou-Talbert, Elwyn Lade, MD   Primary Gastroenterologist:  Elon Alas. Abbey Chatters, DO   Chief Complaint  Patient presents with  . Consult    TCS never done prior. believes mom had polyps    HPI:  [name] is a 54 y.o. female here at the request of Dr. Angelia Mould for further colonoscopy.  Patient was brought in for evaluation due to polypharmacy and need for evaluation prior to procedure.  She has never had a colonoscopy.  She believes her mother had colon polyps.  She states she has had chronic anemia.  She recalls having a blood transfusion in 2013 but sounds like she was having menstrual losses and ultimately underwent endometrial ablation.  On chronic oral iron.  No iron infusions.  When she was completing work-up for anemia she states she underwent an upper GI series.  At that time her hemoglobin was 5.  She really does not have any specific details.  From a GI standpoint she has a history of heartburn.  Typically well controlled on omeprazole 40 mg daily but currently she is on medication.  She is having nocturnal symptoms.  Denies dysphagia.  No vomiting.  No abdominal pain.  Used to have a regular bowel movement but now having more issues with constipation.  May skip a few days without a BM especially the last few weeks.  No melena or rectal bleeding.  Denies any recent medication changes.  Denies lactose intolerance.  She does recall eating a lot more nuts than she usually does and these typically cause some GI upset.  She also has positive hepatitis C antibody back in July 2020.  Noted to have microcytic anemia with normal B12 level at that time as well.  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  She states she has been screened for HIV in the past and has been negative.  She admits to sexual partners with history of IV drug use.  She states her daughter has hepatitis C and she is a heroin addict.  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  Labs from July 2020: Hemoglobin 9.7, hematocrit 30.1, platelets 413,000, MCV 76.5, hepatitis B surface antibody reactive, hepatitis B core antibody nonreactive, hepatitis C antibody reactive, hepatitis A total reactive, hepatitis B surface antigen nonreactive, hepatitis A IgM nonreactive.      Current Outpatient Medications  Medication Sig Dispense Refill  . Adalimumab (HUMIRA PEN) 40 MG/0.4ML PNKT Inject into the skin every 14 (fourteen) days.    . Aspirin-Caffeine (BC FAST PAIN RELIEF ARTHRITIS) 1000-65 MG PACK Take 1 Package by mouth in the morning, at noon, and at bedtime.    . ferrous sulfate 325 (65 FE) MG tablet Take 325 mg by mouth daily with breakfast.    . folic acid (FOLVITE) 1 MG tablet Take 1 mg by mouth daily.    Marland Kitchen gabapentin (NEURONTIN) 300 MG capsule Take 300 mg by mouth 3 (three) times daily.    Marland Kitchen METHADONE HCL PO Take 120 mg by mouth daily.    . Multiple Vitamin (MULTIVITAMIN WITH MINERALS) TABS Take 1 tablet by mouth daily. Reported on 05/02/2015    . naproxen (NAPROSYN) 500 MG tablet Take 500 mg by mouth 2 (two) times daily with a meal. As needed     No current facility-administered medications for this visit.    Allergies as of 10/20/2019 - Review Complete 10/20/2019  Allergen Reaction  Noted  . Voltaren [diclofenac sodium] Nausea And Vomiting 12/30/2014  . Codeine Itching and Rash 03/01/2012    Past Medical History:  Diagnosis Date  . Arthritis, rheumatoid ([place])   . Chronic anemia   . Chronic pain   . COPD (chronic obstructive pulmonary disease) ([place])   . Depression   . Hypertension   . Incidental lung nodule   . Osteoporosis   . Rheumatoid arthritis(714.0)   . Trichimoniasis     Past Surgical History:  Procedure Laterality Date  . BREAST CYST EXCISION  12/31/2010   Procedure: CYST EXCISION BREAST;  Surgeon: Donato Heinz, MD;  Location: [facility];  Service: General;  Laterality: Right;  Excision  Sebaceous Cyst Right Breast  . ENDOMETRIAL ABLATION    . TUBAL LIGATION      Family History  Problem Relation Age of Onset  . Cancer Father        lung, adrenal gland  . Hypertension Mother   . CVA Mother   . Colon polyps Mother   . Other Mother        female issues but not clear if cancer  . Hypertension Daughter   . Anesthesia problems Neg Hx   . Colon cancer Neg Hx     Social History   Socioeconomic History  . Marital status: Single    Spouse name: Not on file  . Number of children: Not on file  . Years of education: Not on file  . Highest education level: Not on file  Occupational History  . Not on file  Tobacco Use  . Smoking status: Current Every Day Smoker    Packs/day: 0.25    Years: 30.00    Pack years: 7.50    Types: Cigarettes  . Smokeless tobacco: Never Used  Vaping Use  . Vaping Use: Never used  Substance and Sexual Activity  . Alcohol use: No    Alcohol/week: 0.0 standard drinks  . 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 Sexual activity: Not Currently    Birth control/protection: Surgical    Comment: tubal and ablation  Other Topics Concern  . Not on file  Social History Narrative  . Not on file   Social Determinants of Health   Financial Resource Strain:   . Difficulty of Paying Living Expenses: Not on file  Food Insecurity:   . Worried About Charity fundraiser in the Last Year: Not on file  . Ran Out of Food in the Last Year: Not on file  Transportation Needs:   . Lack of Transportation (Medical): Not on file  . Lack of Transportation (Non-Medical): Not on file  Physical Activity:   . Days of Exercise per Week: Not on file  . Minutes of Exercise per Session: Not on file  Stress:   . Feeling of Stress : Not on file  Social Connections:   . Frequency of Communication with Friends and Family: Not on file  . Frequency of Social Gatherings with Friends and Family: Not on file  . Attends Religious Services: Not on file   . Active Member of Clubs or Organizations: Not on file  . Attends Archivist Meetings: Not on file  . Marital Status: Not on file  Intimate Partner Violence:   . Fear of Current or Ex-Partner: Not on file  . Emotionally Abused: Not on file  . Physically Abused: Not on file  . Sexually Abused: Not on file  ROS:  General: Negative for anorexia, weight loss, fever, chills, fatigue, weakness. Eyes: Negative for vision changes.  ENT: Negative for hoarseness, difficulty swallowing , nasal congestion. CV: Negative for chest pain, angina, palpitations, dyspnea on exertion, peripheral edema.  Respiratory: Negative for dyspnea at rest, dyspnea on exertion, cough, sputum, wheezing.  GI: See history of present illness. GU:  Negative for dysuria, hematuria, urinary incontinence, urinary frequency, nocturnal urination.  MS: Positive for joint pain, low back pain.  Derm: Negative for rash or itching.  Neuro: Negative for weakness, abnormal sensation, seizure, frequent headaches, memory loss, confusion.  Psych: Negative for  suicidal ideation, hallucinations.  Positive for anxiety and depression Endo: Negative for unusual weight change.  Heme: Negative for bruising or bleeding. Allergy: Negative for rash or hives.    Physical Examination:  BP 126/79   Pulse 81   Temp (!) 97.1 F (36.2 C) (Oral)   Ht $R'5\' 6"'qJ$  (1.676 m)   Wt 155 lb 12.8 oz (70.7 kg)   BMI 25.15 kg/m    General: Appears older than stated age, very pleasant but had to redirect her multiple times during the visit with lack of focus.  No acute distress.   Head: Normocephalic, atraumatic.   Eyes: Conjunctiva pink, no icterus. Mouth: masked. Neck: Supple without thyromegaly, masses, or lymphadenopathy.  Lungs: Clear to auscultation bilaterally.  Heart: Regular rate and rhythm, no murmurs rubs or gallops.  Abdomen: Bowel sounds are normal, nontender, nondistended, no hepatosplenomegaly or masses, no abdominal  bruits or    hernia , no rebound or guarding.   Rectal: not performed Extremities: No lower extremity edema. No clubbing or deformities.  Neuro: Alert and oriented x 4 , grossly normal neurologically.  Skin: Warm and dry, no rash or jaundice.   Psych: Alert and cooperative, normal mood and affect.  Labs: See hpi  Imaging Studies: No results found.  Impression/Plan:  54 year old female presenting to schedule colonoscopy.  She has had some recent change in bowel habits, towards constipation.  Possibly dietary changes contributing as outlined above.  Family history, mother with colon polyps.  Patient noted to have microcytic anemia on labs last year.  She states she has had anemia for a while, require blood transfusion several years ago but appears to have been at least in part due to menstrual losses.  She has been on chronic oral iron.  Update labs today. Plan for colonoscopy in the near future with propofol. ASA III.  I have discussed the risks, alternatives, benefits with regards to but not limited to the risk of reaction to medication, bleeding, infection, perforation and the patient is agreeable to proceed. Written consent to be obtained.  GERD typically well controlled on medication.  Currently out of omeprazole.  Prescription for omeprazole 40 mg daily before breakfast sent to her pharmacy.  She is also on NSAIDs, given history of anemia, would recommend concomitant PPI therapy.  Constipation, possibly related to diet.  She will cut back on nut consumption.  Trial of Amitiza 24 mcg 1-2 times daily as needed for constipation.  Address need to have regular bowel movements preceding bowel preparation for colonoscopy to ensure bowel prep is adequate.  Patient voiced understanding.  Hepatitis C antibody positive. She has multiple risk factors. Suspect chronic Hep C. Will check HCVRNA, c-Met. She is immune to Hep B and A based on serologies.

## 2019-10-21 LAB — HCV RNA QUANT: HCV Quantitative: NOT DETECTED IU/mL (ref >50)

## 2019-10-23 ENCOUNTER — Encounter: Payer: Self-pay | Admitting: *Deleted

## 2019-12-04 NOTE — Patient Instructions (Addendum)
[name]  12/04/2019     @PREFPERIOPPHARMACY @   Your procedure is scheduled on  12/12/2019.  Report to 12/14/2019 at  0915  A.M.  Call this number if you have problems the morning of surgery:  [phone]   Remember:  Follow the diet and prep instructions given to you by the office.                      Take these medicines the morning of surgery with A SIP OF WATER  Gabapentin,  prilosec, rinvoq.    Do not wear jewelry, make-up or nail polish.  Do not wear lotions, powders, or perfumes. Please wear deodorant and brush your teeth.  Do not shave 48 hours prior to surgery.  Men may shave face and neck.  Do not bring valuables to the hospital.  [facility] is not responsible for any belongings or valuables.  Contacts, dentures or bridgework may not be worn into surgery.  Leave your suitcase in the car.  After surgery it may be brought to your room.  For patients admitted to the hospital, discharge time will be determined by your treatment team.  Patients discharged the day of surgery will not be allowed to drive home.   Name and phone number of your driver:   family Special instructions:  DO NOT smoke the morning of your procedure.  Please read over the following fact sheets that you were given. Anesthesia Post-op Instructions and Care and Recovery After Surgery       Colonoscopy, Adult, Care After This sheet gives you information about how to care for yourself after your procedure. Your health care provider may also give you more specific instructions. If you have problems or questions, contact your health care provider. What can I expect after the procedure? After the procedure, it is common to have:  A small amount of blood in your stool for 24 hours after the procedure.  Some gas.  Mild cramping or bloating of your abdomen. Follow these instructions at home: Eating and drinking   Drink enough fluid to keep your urine pale yellow.  Follow  instructions from your health care provider about eating or drinking restrictions.  Resume your normal diet as instructed by your health care provider. Avoid heavy or fried foods that are hard to digest. Activity  Rest as told by your health care provider.  Avoid sitting for a long time without moving. Get up to take short walks every 1-2 hours. This is important to improve blood flow and breathing. Ask for help if you feel weak or unsteady.  Return to your normal activities as told by your health care provider. Ask your health care provider what activities are safe for you. Managing cramping and bloating   Try walking around when you have cramps or feel bloated.  Apply heat to your abdomen as told by your health care provider. Use the heat source that your health care provider recommends, such as a moist heat pack or a heating pad. ? Place a towel between your skin and the heat source. ? Leave the heat on for 20-30 minutes. ? Remove the heat if your skin turns bright red. This is especially important if you are unable to feel pain, heat, or cold. You may have a greater risk of getting burned. General instructions  For the first 24 hours after the procedure: ? Do not drive or use machinery. ? Do not  sign important documents. ? Do not drink alcohol. ? Do your regular daily activities at a slower pace than normal. ? Eat soft foods that are easy to digest.  Take over-the-counter and prescription medicines only as told by your health care provider.  Keep all follow-up visits as told by your health care provider. This is important. Contact a health care provider if:  You have blood in your stool 2-3 days after the procedure. Get help right away if you have:  More than a small spotting of blood in your stool.  Large blood clots in your stool.  Swelling of your abdomen.  Nausea or vomiting.  A fever.  Increasing pain in your abdomen that is not relieved with  medicine. Summary  After the procedure, it is common to have a small amount of blood in your stool. You may also have mild cramping and bloating of your abdomen.  For the first 24 hours after the procedure, do not drive or use machinery, sign important documents, or drink alcohol.  Get help right away if you have a lot of blood in your stool, nausea or vomiting, a fever, or increased pain in your abdomen. This information is not intended to replace advice given to you by your health care provider. Make sure you discuss any questions you have with your health care provider. Document Revised: 07/25/2018 Document Reviewed: 07/25/2018 Elsevier Patient Education  [place] After These instructions provide you with information about caring for yourself after your procedure. Your health care provider may also give you more specific instructions. Your treatment has been planned according to current medical practices, but problems sometimes occur. Call your health care provider if you have any problems or questions after your procedure. What can I expect after the procedure? After your procedure, you may:  Feel sleepy for several hours.  Feel clumsy and have poor balance for several hours.  Feel forgetful about what happened after the procedure.  Have poor judgment for several hours.  Feel nauseous or vomit.  Have a sore throat if you had a breathing tube during the procedure. Follow these instructions at home: For at least 24 hours after the procedure:      Have a responsible adult stay with you. It is important to have someone help care for you until you are awake and alert.  Rest as needed.  Do not: ? Participate in activities in which you could fall or become injured. ? Drive. ? Use heavy machinery. ? Drink alcohol. ? Take sleeping pills or medicines that cause drowsiness. ? Make important decisions or sign legal documents. ? Take care  of children on your own. Eating and drinking  Follow the diet that is recommended by your health care provider.  If you vomit, drink water, juice, or soup when you can drink without vomiting.  Make sure you have little or no nausea before eating solid foods. General instructions  Take over-the-counter and prescription medicines only as told by your health care provider.  If you have sleep apnea, surgery and certain medicines can increase your risk for breathing problems. Follow instructions from your health care provider about wearing your sleep device: ? Anytime you are sleeping, including during daytime naps. ? While taking prescription pain medicines, sleeping medicines, or medicines that make you drowsy.  If you smoke, do not smoke without supervision.  Keep all follow-up visits as told by your health care provider. This is important. Contact a health care provider  if:  You keep feeling nauseous or you keep vomiting.  You feel light-headed.  You develop a rash.  You have a fever. Get help right away if:  You have trouble breathing. Summary  For several hours after your procedure, you may feel sleepy and have poor judgment.  Have a responsible adult stay with you for at least 24 hours or until you are awake and alert. This information is not intended to replace advice given to you by your health care provider. Make sure you discuss any questions you have with your health care provider. Document Revised: 03/29/2017 Document Reviewed: 04/21/2015 Elsevier Patient Education  [place].

## 2019-12-06 ENCOUNTER — Other Ambulatory Visit: Payer: Self-pay

## 2019-12-06 ENCOUNTER — Encounter (HOSPITAL_COMMUNITY)
Admission: RE | Admit: 2019-12-06 | Discharge: 2019-12-06 | Disposition: A | Discharge status: Home / Self Care | Payer: Medicaid Other | Source: Ambulatory Visit | Attending: Internal Medicine | Admitting: Internal Medicine

## 2019-12-06 ENCOUNTER — Encounter (HOSPITAL_COMMUNITY): Payer: Self-pay

## 2019-12-06 DIAGNOSIS — Z01810 Encounter for preprocedural cardiovascular examination: Secondary | ICD-10-CM | POA: Insufficient documentation

## 2019-12-11 ENCOUNTER — Other Ambulatory Visit: Payer: Self-pay

## 2019-12-11 ENCOUNTER — Other Ambulatory Visit (HOSPITAL_COMMUNITY)
Admission: RE | Admit: 2019-12-11 | Discharge: 2019-12-11 | Disposition: A | Discharge status: Home / Self Care | Payer: Medicaid Other | Source: Ambulatory Visit | Attending: Internal Medicine | Admitting: Internal Medicine

## 2019-12-11 DIAGNOSIS — Z01818 Encounter for other preprocedural examination: Secondary | ICD-10-CM | POA: Insufficient documentation

## 2019-12-11 DIAGNOSIS — Z20822 Contact with and (suspected) exposure to covid-19: Secondary | ICD-10-CM | POA: Diagnosis not present

## 2019-12-11 LAB — SARS CORONAVIRUS 2 (TAT 6-24 HRS): SARS Coronavirus 2: NEGATIVE

## 2019-12-12 ENCOUNTER — Ambulatory Visit (HOSPITAL_COMMUNITY)
Admission: RE | Admit: 2019-12-12 | Discharge: 2019-12-12 | Disposition: A | Discharge status: Home / Self Care | Payer: Medicaid Other | Attending: Internal Medicine | Admitting: Internal Medicine

## 2019-12-12 ENCOUNTER — Ambulatory Visit (HOSPITAL_COMMUNITY): Payer: Medicaid Other

## 2019-12-12 ENCOUNTER — Encounter (HOSPITAL_COMMUNITY)
Admission: RE | Disposition: A | Discharge status: Home / Self Care | Payer: Self-pay | Source: Home / Self Care | Attending: Internal Medicine

## 2019-12-12 ENCOUNTER — Encounter (HOSPITAL_COMMUNITY): Payer: Self-pay

## 2019-12-12 DIAGNOSIS — I10 Essential (primary) hypertension: Secondary | ICD-10-CM | POA: Insufficient documentation

## 2019-12-12 DIAGNOSIS — Z885 Allergy status to narcotic agent status: Secondary | ICD-10-CM | POA: Diagnosis not present

## 2019-12-12 DIAGNOSIS — D509 Iron deficiency anemia, unspecified: Secondary | ICD-10-CM | POA: Diagnosis not present

## 2019-12-12 DIAGNOSIS — Z888 Allergy status to other drugs, medicaments and biological substances status: Secondary | ICD-10-CM | POA: Insufficient documentation

## 2019-12-12 DIAGNOSIS — K648 Other hemorrhoids: Secondary | ICD-10-CM | POA: Insufficient documentation

## 2019-12-12 DIAGNOSIS — Z8371 Family history of colonic polyps: Secondary | ICD-10-CM | POA: Diagnosis not present

## 2019-12-12 DIAGNOSIS — Z8249 Family history of ischemic heart disease and other diseases of the circulatory system: Secondary | ICD-10-CM | POA: Diagnosis not present

## 2019-12-12 DIAGNOSIS — F17200 Nicotine dependence, unspecified, uncomplicated: Secondary | ICD-10-CM | POA: Insufficient documentation

## 2019-12-12 DIAGNOSIS — Z801 Family history of malignant neoplasm of trachea, bronchus and lung: Secondary | ICD-10-CM | POA: Insufficient documentation

## 2019-12-12 DIAGNOSIS — Z79899 Other long term (current) drug therapy: Secondary | ICD-10-CM | POA: Diagnosis not present

## 2019-12-12 DIAGNOSIS — M069 Rheumatoid arthritis, unspecified: Secondary | ICD-10-CM | POA: Insufficient documentation

## 2019-12-12 DIAGNOSIS — E0590 Thyrotoxicosis, unspecified without thyrotoxic crisis or storm: Secondary | ICD-10-CM | POA: Diagnosis not present

## 2019-12-12 DIAGNOSIS — J449 Chronic obstructive pulmonary disease, unspecified: Secondary | ICD-10-CM | POA: Insufficient documentation

## 2019-12-12 DIAGNOSIS — F17210 Nicotine dependence, cigarettes, uncomplicated: Secondary | ICD-10-CM | POA: Diagnosis not present

## 2019-12-12 DIAGNOSIS — Z823 Family history of stroke: Secondary | ICD-10-CM | POA: Diagnosis not present

## 2019-12-12 DIAGNOSIS — K219 Gastro-esophageal reflux disease without esophagitis: Secondary | ICD-10-CM | POA: Insufficient documentation

## 2019-12-12 DIAGNOSIS — Z791 Long term (current) use of non-steroidal anti-inflammatories (NSAID): Secondary | ICD-10-CM | POA: Diagnosis not present

## 2019-12-12 DIAGNOSIS — Z7982 Long term (current) use of aspirin: Secondary | ICD-10-CM | POA: Diagnosis not present

## 2019-12-12 DIAGNOSIS — K5730 Diverticulosis of large intestine without perforation or abscess without bleeding: Secondary | ICD-10-CM | POA: Insufficient documentation

## 2019-12-12 HISTORY — PX: COLONOSCOPY WITH PROPOFOL: SHX5780

## 2019-12-12 SURGERY — COLONOSCOPY WITH PROPOFOL
Anesthesia: General

## 2019-12-12 MED ORDER — PROPOFOL 10 MG/ML IV BOLUS
INTRAVENOUS | Status: DC | PRN
  Administered 2019-12-12: 50 mg via INTRAVENOUS
  Administered 2019-12-12: 30 mg via INTRAVENOUS
  Administered 2019-12-12: 100 mg via INTRAVENOUS
  Administered 2019-12-12: 20 mg via INTRAVENOUS

## 2019-12-12 MED ORDER — LACTATED RINGERS IV SOLN
INTRAVENOUS | Status: DC
  Administered 2019-12-12: 1000 mL via INTRAVENOUS

## 2019-12-12 MED ORDER — LIDOCAINE HCL (CARDIAC) PF 100 MG/5ML IV SOSY
PREFILLED_SYRINGE | INTRAVENOUS | Status: DC | PRN
  Administered 2019-12-12: 50 mg via INTRAVENOUS

## 2019-12-12 MED ORDER — CHLORHEXIDINE GLUCONATE CLOTH 2 % EX PADS: 6.0000 | MEDICATED_PAD | Freq: Once | CUTANEOUS | Status: DC

## 2019-12-12 MED ORDER — PROPOFOL 500 MG/50ML IV EMUL
INTRAVENOUS | Status: DC | PRN
  Administered 2019-12-12: 150 ug/kg/min via INTRAVENOUS

## 2019-12-12 NOTE — H&P (Addendum)
Primary Care Physician:  Tanna Furry, MD Primary Gastroenterologist:  Dr. Marletta Lor  Pre-Procedure History & Physical: HPI:  [name] is a 54 y.o. female is here for a colonoscopy for anemia.  Patient denies any family history of colorectal cancer.  No melena or hematochezia.  No abdominal pain or unintentional weight loss.  No change in bowel habits.  Overall feels well from a GI standpoint.  Past Medical History:  Diagnosis Date  . Arthritis, rheumatoid (HCC)   . Chronic anemia   . Chronic pain   . COPD (chronic obstructive pulmonary disease) (HCC)   . Depression   . Hypertension   . Incidental lung nodule   . Osteoporosis   . PTSD (post-traumatic stress disorder)   . Rheumatoid arthritis(714.0)   . Trichimoniasis     Past Surgical History:  Procedure Laterality Date  . BREAST CYST EXCISION  12/31/2010   Procedure: CYST EXCISION BREAST;  Surgeon: Fabio Bering, MD;  Location: [facility];  Service: General;  Laterality: Right;  Excision Sebaceous Cyst Right Breast  . ENDOMETRIAL ABLATION    . TUBAL LIGATION    . WISDOM TOOTH EXTRACTION      Prior to Admission medications   Medication Sig Start Date End Date Taking? Authorizing Provider  Aspirin-Caffeine (BC FAST PAIN RELIEF ARTHRITIS) 1000-65 MG PACK Take 1 Package by mouth 2 (two) times daily.    Yes [provider]  ferrous sulfate 325 (65 FE) MG tablet Take 325 mg by mouth daily with breakfast.   Yes [provider]  folic acid (FOLVITE) 1 MG tablet Take 1 mg by mouth daily.   Yes [provider]  gabapentin (NEURONTIN) 300 MG capsule Take 300 mg by mouth 3 (three) times daily as needed (pain).    Yes [provider]  lubiprostone (AMITIZA) 24 MCG capsule Take 1 capsule (24 mcg total) by mouth 2 (two) times daily with a meal. 10/20/19  Yes Tiffany Kocher, PA-C  METHADONE HCL PO Take 120 mg by mouth daily.   Yes [provider]  methotrexate (RHEUMATREX) 2.5 MG tablet  Take 15 mg by mouth every Monday. Caution:Chemotherapy. Protect from light.   Yes [provider]  Multiple Vitamin (MULTIVITAMIN WITH MINERALS) TABS Take 1 tablet by mouth daily.    Yes [provider]  omeprazole (PRILOSEC) 40 MG capsule Take 1 capsule (40 mg total) by mouth daily before breakfast. 10/20/19  Yes Tiffany Kocher, PA-C  Upadacitinib ER (RINVOQ) 15 MG TB24 Take 15 mg by mouth daily.   Yes [provider]    Allergies as of 10/20/2019 - Review Complete 10/20/2019  Allergen Reaction Noted  . Voltaren [diclofenac sodium] Nausea And Vomiting 12/30/2014  . Codeine Itching and Rash 03/01/2012    Family History  Problem Relation Age of Onset  . Cancer Father        lung, adrenal gland  . Hypertension Mother   . CVA Mother   . Colon polyps Mother   . Other Mother        female issues but not clear if cancer  . Hypertension Daughter   . Anesthesia problems Neg Hx   . Colon cancer Neg Hx     Social History   Socioeconomic History  . Marital status: Single    Spouse name: Not on file  . Number of children: Not on file  . Years of education: Not on file  . Highest education level: Not on file  Occupational History  .  Not on file  Tobacco Use  . Smoking status: Current Every Day Smoker    Packs/day: 0.25    Years: 30.00    Pack years: 7.50    Types: Cigarettes  . Smokeless tobacco: Never Used  Vaping Use  . Vaping Use: Never used  Substance and Sexual Activity  . Alcohol use: No    Alcohol/week: 0.0 standard drinks  . 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 Sexual activity: Not Currently    Birth control/protection: Surgical    Comment: tubal and ablation  Other Topics Concern  . Not on file  Social History Narrative  . Not on file   Social Determinants of Health   Financial Resource Strain:   . Difficulty of Paying Living Expenses: Not on file  Food Insecurity:   . Worried About Programme researcher, broadcasting/film/video in the  Last Year: Not on file  . Ran Out of Food in the Last Year: Not on file  Transportation Needs:   . Lack of Transportation (Medical): Not on file  . Lack of Transportation (Non-Medical): Not on file  Physical Activity:   . Days of Exercise per Week: Not on file  . Minutes of Exercise per Session: Not on file  Stress:   . Feeling of Stress : Not on file  Social Connections:   . Frequency of Communication with Friends and Family: Not on file  . Frequency of Social Gatherings with Friends and Family: Not on file  . Attends Religious Services: Not on file  . Active Member of Clubs or Organizations: Not on file  . Attends Banker Meetings: Not on file  . Marital Status: Not on file  Intimate Partner Violence:   . Fear of Current or Ex-Partner: Not on file  . Emotionally Abused: Not on file  . Physically Abused: Not on file  . Sexually Abused: Not on file    Review of Systems: See HPI, otherwise negative ROS  Impression/Plan: [name] is here for a colonoscopy to be performed for anemia  The risks of the procedure including infection, bleed, or perforation as well as benefits, limitations, alternatives and imponderables have been reviewed with the patient. Questions have been answered. All parties agreeable.

## 2019-12-12 NOTE — Op Note (Signed)
[facility] Patient Name: [name] Procedure Date: 12/12/2019 10:20 AM MRN: [id number] Date of Birth: 26-Dec-1965 Attending MD: Elon Alas. Abbey Chatters DO CSN: [id number] Age: 54 Admit Type: Outpatient Procedure:                Colonoscopy Indications:              Iron deficiency anemia Providers:                Elon Alas. Saud Bail, DO, Otis Peak B. Sharon Seller, RN,                            Lambert Mody, Nelma Rothman, Technician Referring MD:              Medicines:                See the Anesthesia note for documentation of the                            administered medications Complications:            No immediate complications. Estimated Blood Loss:     Estimated blood loss: none. Procedure:                Pre-Anesthesia Assessment:                           - The anesthesia plan was to use monitored                            anesthesia care (MAC).                           After obtaining informed consent, the colonoscope                            was passed under direct vision. Throughout the                            procedure, the patient's blood pressure, pulse, and                            oxygen saturations were monitored continuously. The                            [id number]-[id number] ([id number]) scope was introduced through                            the anus and advanced to the the cecum, identified                            by appendiceal orifice and ileocecal valve. The                            colonoscopy was performed without difficulty. The                            patient tolerated the procedure well.  The quality                            of the bowel preparation was evaluated using the                            BBPS [facility] Bowel Preparation Scale) with scores                            of: Right Colon = 2 (minor amount of residual                            staining, small fragments of stool and/or opaque                            liquid, but mucosa seen  well), Transverse Colon = 2                            (minor amount of residual staining, small fragments                            of stool and/or opaque liquid, but mucosa seen                            well) and Left Colon = 2 (minor amount of residual                            staining, small fragments of stool and/or opaque                            liquid, but mucosa seen well). The total BBPS score                            equals 6. The quality of the bowel preparation was                            fair. Scope In: 10:37:33 AM Scope Out: 10:53:44 AM Scope Withdrawal Time: 0 hours 9 minutes 53 seconds  Total Procedure Duration: 0 hours 16 minutes 11 seconds  Findings:      The perianal and digital rectal examinations were normal.      Non-bleeding internal hemorrhoids were found during retroflexion.      Multiple small-mouthed diverticula were found in the sigmoid colon and       descending colon.      A moderate amount of stool was found in the entire colon, making       visualization difficult. Lavage of the area was performed using copious       amounts of sterile water, resulting in clearance with fair visualization. Impression:               - Preparation of the colon was fair.                           - Non-bleeding internal hemorrhoids.                           -  Diverticulosis in the sigmoid colon and in the                            descending colon.                           - Stool in the entire examined colon.                           - No specimens collected. Moderate Sedation:      Per Anesthesia Care Recommendation:           - Patient has a contact number available for                            emergencies. The signs and symptoms of potential                            delayed complications were discussed with the                            patient. Return to normal activities tomorrow.                            Written discharge instructions were  provided to the                            patient.                           - Resume previous diet.                           - Continue present medications.                           - Repeat colonoscopy in 10 years for screening                            purposes.                           - Return to GI clinic in 3 months. Procedure Code(s):        --- Professional ---                           [phone], Colonoscopy, flexible; diagnostic, including                            collection of specimen(s) by brushing or washing,                            when performed (separate procedure) Diagnosis Code(s):        --- Professional ---                           Y65.0, Other hemorrhoids  D50.9, Iron deficiency anemia, unspecified                           K57.30, Diverticulosis of large intestine without                            perforation or abscess without bleeding CPT copyright 2019 American Medical Association. All rights reserved. The codes documented in this report are preliminary and upon coder review may  be revised to meet current compliance requirements. Elon Alas. Abbey Chatters, DO [place] Oskar Cretella, DO 12/12/2019 11:00:36 AM This report has been signed electronically. Number of Addenda: 0

## 2019-12-12 NOTE — Transfer of Care (Signed)
Immediate Anesthesia Transfer of Care Note  Patient: [name]  Procedure(s) Performed: COLONOSCOPY WITH PROPOFOL (N/A )  Patient Location: PACU  Anesthesia Type:MAC  Level of Consciousness: drowsy  Airway & Oxygen Therapy: Patient Spontanous Breathing  Post-op Assessment: Report given to RN and Post -op Vital signs reviewed and stable  Post vital signs: Reviewed and stable  Last Vitals:  Vitals Value Taken Time  BP 88/53 12/12/19 1100  Temp 97.6   Pulse 66 12/12/19 1100  Resp 14 12/12/19 1100  SpO2 92 % 12/12/19 1100  Vitals shown include unvalidated device data.  Last Pain:  Vitals:   12/12/19 1034  TempSrc:   PainSc: 0-No pain      Patients Stated Pain Goal: 8 (03/40/35 2481)  Complications: No complications documented.

## 2019-12-12 NOTE — Anesthesia Postprocedure Evaluation (Signed)
Anesthesia Post Note  Patient: [name]  Procedure(s) Performed: COLONOSCOPY WITH PROPOFOL (N/A )  Anesthesia Type: General Level of consciousness: awake, oriented and awake and alert Pain management: pain level controlled Vital Signs Assessment: post-procedure vital signs reviewed and stable Respiratory status: spontaneous breathing, respiratory function stable and nonlabored ventilation Cardiovascular status: blood pressure returned to baseline and stable Postop Assessment: no apparent nausea or vomiting Anesthetic complications: no   No complications documented.   Last Vitals:  Vitals:   12/12/19 0947  BP: (!) 143/86  Resp: 11  Temp: 36.7 C  SpO2: 95%    Last Pain:  Vitals:   12/12/19 1034  TempSrc:   PainSc: 0-No pain                 Lorin Glass

## 2019-12-12 NOTE — Discharge Instructions (Addendum)
Hemorrhoids Hemorrhoids are swollen veins that may develop:  In the butt (rectum). These are called internal hemorrhoids.  Around the opening of the butt (anus). These are called external hemorrhoids. Hemorrhoids can cause pain, itching, or bleeding. Most of the time, they do not cause serious problems. They usually get better with diet changes, lifestyle changes, and other home treatments. What are the causes? This condition may be caused by:  Having trouble pooping (constipation).  Pushing hard (straining) to poop.  Watery poop (diarrhea).  Pregnancy.  Being very overweight (obese).  Sitting for long periods of time.  Heavy lifting or other activity that causes you to strain.  Anal sex.  Riding a bike for a long period of time. What are the signs or symptoms? Symptoms of this condition include:  Pain.  Itching or soreness in the butt.  Bleeding from the butt.  Leaking poop.  Swelling in the area.  One or more lumps around the opening of your butt. How is this diagnosed? A doctor can often diagnose this condition by looking at the affected area. The doctor may also:  Do an exam that involves feeling the area with a gloved hand (digital rectal exam).  Examine the area inside your butt using a small tube (anoscope).  Order blood tests. This may be done if you have lost a lot of blood.  Have you get a test that involves looking inside the colon using a flexible tube with a camera on the end (sigmoidoscopy or colonoscopy). How is this treated? This condition can usually be treated at home. Your doctor may tell you to change what you eat, make lifestyle changes, or try home treatments. If these do not help, procedures can be done to remove the hemorrhoids or make them smaller. These may involve:  Placing rubber bands at the base of the hemorrhoids to cut off their blood supply.  Injecting medicine into the hemorrhoids to shrink them.  Shining a type of light  energy onto the hemorrhoids to cause them to fall off.  Doing surgery to remove the hemorrhoids or cut off their blood supply. Follow these instructions at home: Eating and drinking   Eat foods that have a lot of fiber in them. These include whole grains, beans, nuts, fruits, and vegetables.  Ask your doctor about taking products that have added fiber (fibersupplements).  Reduce the amount of fat in your diet. You can do this by: ? Eating low-fat dairy products. ? Eating less red meat. ? Avoiding processed foods.  Drink enough fluid to keep your pee (urine) pale yellow. Managing pain and swelling   Take a warm-water bath (sitz bath) for 20 minutes to ease pain. Do this 3-4 times a day. You may do this in a bathtub or using a portable sitz bath that fits over the toilet.  If told, put ice on the painful area. It may be helpful to use ice between your warm baths. ? Put ice in a plastic bag. ? Place a towel between your skin and the bag. ? Leave the ice on for 20 minutes, 2-3 times a day. General instructions  Take over-the-counter and prescription medicines only as told by your doctor. ? Medicated creams and medicines may be used as told.  Exercise often. Ask your doctor how much and what kind of exercise is best for you.  Go to the bathroom when you have the urge to poop. Do not wait.  Avoid pushing too hard when you poop.  Keep your   butt dry and clean. Use wet toilet paper or moist towelettes after pooping.  Do not sit on the toilet for a long time.  Keep all follow-up visits as told by your doctor. This is important. Contact a doctor if you:  Have pain and swelling that do not get better with treatment or medicine.  Have trouble pooping.  Cannot poop.  Have pain or swelling outside the area of the hemorrhoids. Get help right away if you have:  Bleeding that will not stop. Summary  Hemorrhoids are swollen veins in the butt or around the opening of the  butt.  They can cause pain, itching, or bleeding.  Eat foods that have a lot of fiber in them. These include whole grains, beans, nuts, fruits, and vegetables.  Take a warm-water bath (sitz bath) for 20 minutes to ease pain. Do this 3-4 times a day. This information is not intended to replace advice given to you by your health care provider. Make sure you discuss any questions you have with your health care provider. Document Revised: 01/06/2018 Document Reviewed: 05/20/2017 Elsevier Patient Education  [place]. Diverticulosis  Diverticulosis is a condition that develops when small pouches (diverticula) form in the wall of the large intestine (colon). The colon is where water is absorbed and stool (feces) is formed. The pouches form when the inside layer of the colon pushes through weak spots in the outer layers of the colon. You may have a few pouches or many of them. The pouches usually do not cause problems unless they become inflamed or infected. When this happens, the condition is called diverticulitis. What are the causes? The cause of this condition is not known. What increases the risk? The following factors may make you more likely to develop this condition:  Being older than age 55. Your risk for this condition increases with age. Diverticulosis is rare among people younger than age 102. By age 20, many people have it.  Eating a low-fiber diet.  Having frequent constipation.  Being overweight.  Not getting enough exercise.  Smoking.  Taking over-the-counter pain medicines, like aspirin and ibuprofen.  Having a family history of diverticulosis. What are the signs or symptoms? In most people, there are no symptoms of this condition. If you do have symptoms, they may include:  Bloating.  Cramps in the abdomen.  Constipation or diarrhea.  Pain in the lower left side of the abdomen. How is this diagnosed? Because diverticulosis usually has no symptoms, it is  most often diagnosed during an exam for other colon problems. The condition may be diagnosed by:  Using a flexible scope to examine the colon (colonoscopy).  Taking an X-ray of the colon after dye has been put into the colon (barium enema).  Having a CT scan. How is this treated? You may not need treatment for this condition. Your health care provider may recommend treatment to prevent problems. You may need treatment if you have symptoms or if you previously had diverticulitis. Treatment may include:  Eating a high-fiber diet.  Taking a fiber supplement.  Taking a live bacteria supplement (probiotic).  Taking medicine to relax your colon. Follow these instructions at home: Medicines  Take over-the-counter and prescription medicines only as told by your health care provider.  If told by your health care provider, take a fiber supplement or probiotic. Constipation prevention Your condition may cause constipation. To prevent or treat constipation, you may need to:  Drink enough fluid to keep your urine pale yellow.  Take over-the-counter or prescription medicines.  Eat foods that are high in fiber, such as beans, whole grains, and fresh fruits and vegetables.  Limit foods that are high in fat and processed sugars, such as fried or sweet foods.  General instructions  Try not to strain when you have a bowel movement.  Keep all follow-up visits as told by your health care provider. This is important. Contact a health care provider if you:  Have pain in your abdomen.  Have bloating.  Have cramps.  Have not had a bowel movement in 3 days. Get help right away if:  Your pain gets worse.  Your bloating becomes very bad.  You have a fever or chills, and your symptoms suddenly get worse.  You vomit.  You have bowel movements that are bloody or black.  You have bleeding from your rectum. Summary  Diverticulosis is a condition that develops when small pouches  (diverticula) form in the wall of the large intestine (colon).  You may have a few pouches or many of them.  This condition is most often diagnosed during an exam for other colon problems.  Treatment may include increasing the fiber in your diet, taking supplements, or taking medicines. This information is not intended to replace advice given to you by your health care provider. Make sure you discuss any questions you have with your health care provider. Document Revised: 07/28/2018 Document Reviewed: 07/28/2018 Elsevier Patient Education  [place].  Colonoscopy Discharge Instructions  Read the instructions outlined below and refer to this sheet in the next few weeks. These discharge instructions provide you with general information on caring for yourself after you leave the hospital. Your doctor may also give you specific instructions. While your treatment has been planned according to the most current medical practices available, unavoidable complications occasionally occur.   ACTIVITY  You may resume your regular activity, but move at a slower pace for the next 24 hours.   Take frequent rest periods for the next 24 hours.   Walking will help get rid of the air and reduce the bloated feeling in your belly (abdomen).   No driving for 24 hours (because of the medicine (anesthesia) used during the test).    Do not sign any important legal documents or operate any machinery for 24 hours (because of the anesthesia used during the test).  NUTRITION  Drink plenty of fluids.   You may resume your normal diet as instructed by your doctor.   Begin with a light meal and progress to your normal diet. Heavy or fried foods are harder to digest and may make you feel sick to your stomach (nauseated).   Avoid alcoholic beverages for 24 hours or as instructed.  MEDICATIONS  You may resume your normal medications unless your doctor tells you otherwise.  WHAT YOU CAN EXPECT  TODAY  Some feelings of bloating in the abdomen.   Passage of more gas than usual.   Spotting of blood in your stool or on the toilet paper.  IF YOU HAD POLYPS REMOVED DURING THE COLONOSCOPY:  No aspirin products for 7 days or as instructed.   No alcohol for 7 days or as instructed.   Eat a soft diet for the next 24 hours.  FINDING OUT THE RESULTS OF YOUR TEST Not all test results are available during your visit. If your test results are not back during the visit, make an appointment with your caregiver to find out the results. Do not assume  everything is normal if you have not heard from your caregiver or the medical facility. It is important for you to follow up on all of your test results.  SEEK IMMEDIATE MEDICAL ATTENTION IF:  You have more than a spotting of blood in your stool.   Your belly is swollen (abdominal distention).   You are nauseated or vomiting.   You have a temperature over 101.   You have abdominal pain or discomfort that is severe or gets worse throughout the day.   Your colonoscopy was relatively unremarkable.  I did not find any evidence of colon cancer or colon polyps. You do have diverticulosis and internal hemorrhoids. I would recommend increasing fiber in your diet or adding OTC Benefiber/Metamucil. Be sure to drink at least 4 to 6 glasses of water daily.  Repeat colonoscopy for screening purposes in 10 years.  Follow-up with GI in 2 to 3 months.   I hope you have a great rest of your week!  Hennie Duos. Marletta Lor, D.O. Gastroenterology and Hepatology [facility]

## 2019-12-12 NOTE — Anesthesia Preprocedure Evaluation (Signed)
Anesthesia Evaluation  Patient identified by MRN, date of birth, ID band Patient awake    Reviewed: Allergy & Precautions, H&P , NPO status , Patient's Chart, lab work & pertinent test results, reviewed documented beta blocker date and time   Airway Mallampati: II  TM Distance: >3 FB Neck ROM: full    Dental no notable dental hx. (+) Teeth Intact   Pulmonary COPD, Current Smoker and Patient abstained from smoking.,    Pulmonary exam normal breath sounds clear to auscultation       Cardiovascular Exercise Tolerance: Good hypertension, negative cardio ROS   Rhythm:regular Rate:Normal     Neuro/Psych PSYCHIATRIC DISORDERS Anxiety Depression negative neurological ROS     GI/Hepatic Neg liver ROS, GERD  Medicated,  Endo/Other  Hyperthyroidism   Renal/GU negative Renal ROS  negative genitourinary   Musculoskeletal   Abdominal   Peds  Hematology  (+) Blood dyscrasia, anemia ,   Anesthesia Other Findings   Reproductive/Obstetrics negative OB ROS                             Anesthesia Physical Anesthesia Plan  ASA: II  Anesthesia Plan: General   Post-op Pain Management:    Induction:   PONV Risk Score and Plan: Propofol infusion  Airway Management Planned:   Additional Equipment:   Intra-op Plan:   Post-operative Plan:   Informed Consent: I have reviewed the patients History and Physical, chart, labs and discussed the procedure including the risks, benefits and alternatives for the proposed anesthesia with the patient or authorized representative who has indicated his/her understanding and acceptance.     Dental Advisory Given  Plan Discussed with: CRNA  Anesthesia Plan Comments:         Anesthesia Quick Evaluation

## 2019-12-14 ENCOUNTER — Encounter (HOSPITAL_COMMUNITY): Payer: Self-pay | Admitting: Internal Medicine

## 2019-12-15 ENCOUNTER — Other Ambulatory Visit (HOSPITAL_COMMUNITY): Payer: Self-pay | Admitting: Family Medicine

## 2019-12-15 DIAGNOSIS — Z13820 Encounter for screening for osteoporosis: Secondary | ICD-10-CM

## 2019-12-23 ENCOUNTER — Other Ambulatory Visit: Payer: Self-pay | Admitting: Gastroenterology

## 2020-02-09 ENCOUNTER — Encounter: Payer: Self-pay | Admitting: Internal Medicine

## 2020-02-09 ENCOUNTER — Ambulatory Visit: Payer: Medicaid Other | Admitting: Gastroenterology

## 2020-02-21 ENCOUNTER — Other Ambulatory Visit
Admission: RE | Admit: 2020-02-21 | Discharge: 2020-02-21 | Disposition: A | Discharge status: Home / Self Care | Payer: Medicaid Other | Source: Ambulatory Visit | Attending: Rheumatology | Admitting: Rheumatology

## 2020-02-21 DIAGNOSIS — R0602 Shortness of breath: Secondary | ICD-10-CM | POA: Insufficient documentation

## 2020-02-21 DIAGNOSIS — M0579 Rheumatoid arthritis with rheumatoid factor of multiple sites without organ or systems involvement: Secondary | ICD-10-CM | POA: Diagnosis not present

## 2020-02-21 LAB — FIBRIN DERIVATIVES D-DIMER (ARMC ONLY): Fibrin derivatives D-dimer (ARMC): 625.44 ng/mL (FEU) — ABNORMAL HIGH (ref 0.00–499.00)

## 2020-02-23 ENCOUNTER — Other Ambulatory Visit (HOSPITAL_COMMUNITY): Payer: Self-pay | Admitting: Rheumatology

## 2020-02-23 ENCOUNTER — Other Ambulatory Visit: Payer: Self-pay | Admitting: Rheumatology

## 2020-02-23 ENCOUNTER — Encounter (HOSPITAL_COMMUNITY): Payer: Self-pay

## 2020-02-23 ENCOUNTER — Ambulatory Visit (HOSPITAL_COMMUNITY): Payer: Medicaid Other

## 2020-02-23 DIAGNOSIS — R7989 Other specified abnormal findings of blood chemistry: Secondary | ICD-10-CM

## 2020-02-23 DIAGNOSIS — R0600 Dyspnea, unspecified: Secondary | ICD-10-CM

## 2020-02-28 ENCOUNTER — Other Ambulatory Visit: Payer: Self-pay

## 2020-02-28 ENCOUNTER — Encounter (HOSPITAL_COMMUNITY): Payer: Self-pay

## 2020-02-28 ENCOUNTER — Ambulatory Visit (HOSPITAL_COMMUNITY)
Admission: RE | Admit: 2020-02-28 | Discharge: 2020-02-28 | Disposition: A | Discharge status: Home / Self Care | Payer: Medicaid Other | Source: Ambulatory Visit | Attending: Rheumatology | Admitting: Rheumatology

## 2020-02-28 DIAGNOSIS — R7989 Other specified abnormal findings of blood chemistry: Secondary | ICD-10-CM | POA: Diagnosis present

## 2020-02-28 DIAGNOSIS — R0600 Dyspnea, unspecified: Secondary | ICD-10-CM | POA: Diagnosis present

## 2020-02-28 MED ORDER — IOHEXOL 350 MG/ML SOLN
100.0000 mL | Freq: Once | INTRAVENOUS | Status: AC | PRN
  Administered 2020-02-28: 100 mL via INTRAVENOUS

## 2020-03-15 ENCOUNTER — Other Ambulatory Visit (HOSPITAL_COMMUNITY): Payer: Self-pay | Admitting: Family Medicine

## 2020-03-15 DIAGNOSIS — Z1231 Encounter for screening mammogram for malignant neoplasm of breast: Secondary | ICD-10-CM

## 2020-04-12 ENCOUNTER — Ambulatory Visit (HOSPITAL_COMMUNITY)
Admission: RE | Admit: 2020-04-12 | Discharge: 2020-04-12 | Disposition: A | Discharge status: Home / Self Care | Payer: Medicaid Other | Source: Ambulatory Visit | Attending: Family Medicine | Admitting: Family Medicine

## 2020-04-12 ENCOUNTER — Other Ambulatory Visit: Payer: Self-pay

## 2020-04-12 DIAGNOSIS — Z1231 Encounter for screening mammogram for malignant neoplasm of breast: Secondary | ICD-10-CM | POA: Insufficient documentation

## 2020-04-12 DIAGNOSIS — Z13820 Encounter for screening for osteoporosis: Secondary | ICD-10-CM | POA: Diagnosis not present

## 2022-03-12 IMAGING — CT CT ANGIO CHEST
2 of 6 series · 18 of 46 positions shown · IV contrast (Omnipaque or Isovue)
Comparison: CT 04/13/2015.

CLINICAL DATA: Shortness of breath for 2 weeks. Elevated D-dimer
levels.

EXAM:
CT ANGIOGRAPHY CHEST WITH CONTRAST
TECHNIQUE: Multidetector CT imaging of the chest was performed using the
standard protocol during bolus administration of intravenous
contrast. Multiplanar CT image reconstructions and MIPs were
obtained to evaluate the vascular anatomy.
CONTRAST:  100mL OMNIPAQUE IOHEXOL 350 MG/ML SOLN

[Series 5: pe axial thins · axial · 0.66mm/px · z∈[+1092,+1354]mm · 15 of 288 slices shown]
[im 13/288  lung]
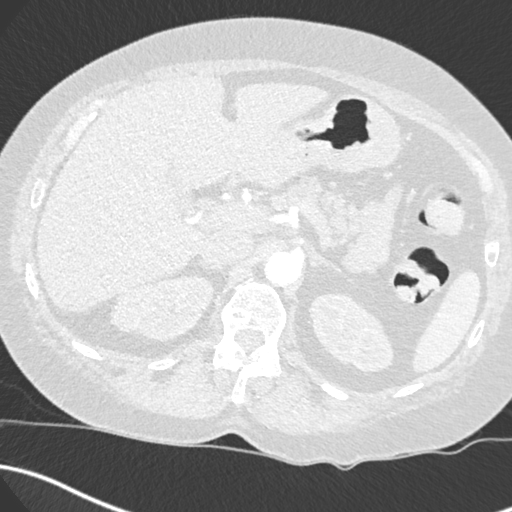
[im 38/288  soft-tissue]
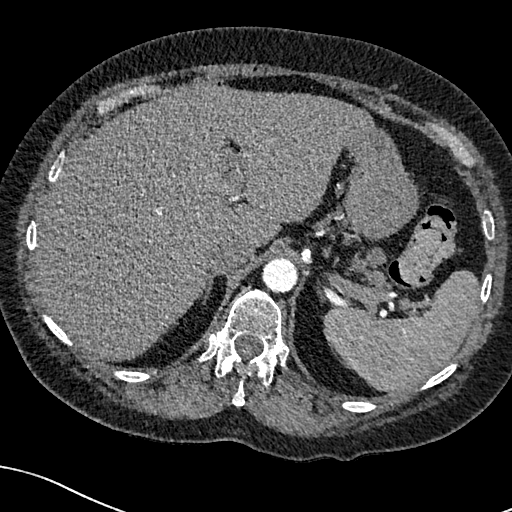
[im 50/288  lung]
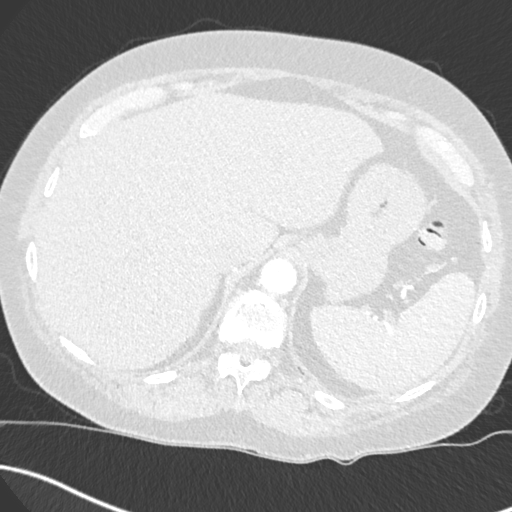
[im 75/288  soft-tissue]
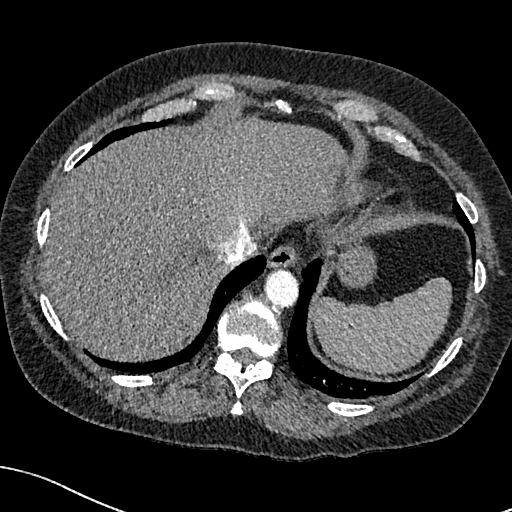
[im 88/288  lung]
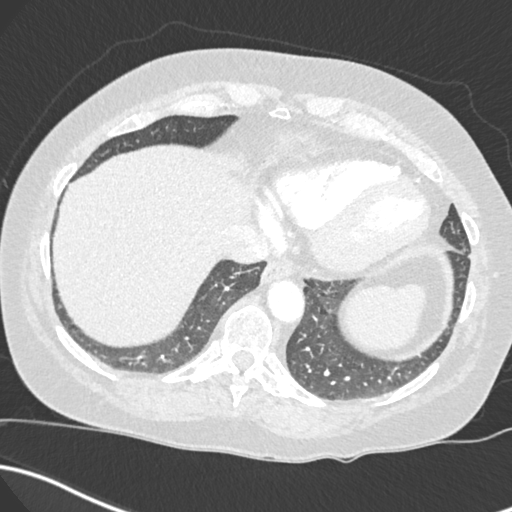
[im 113/288  soft-tissue]
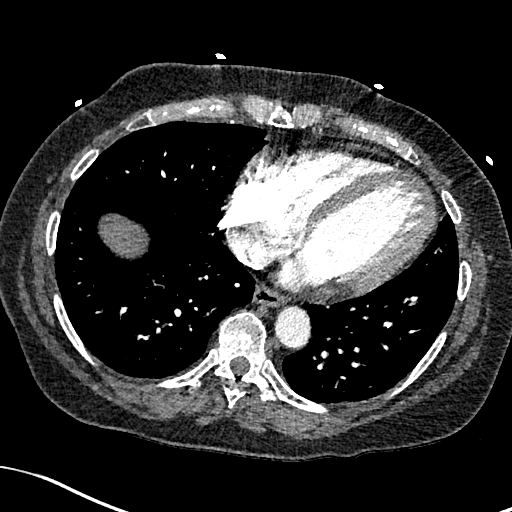
[im 125/288  lung]
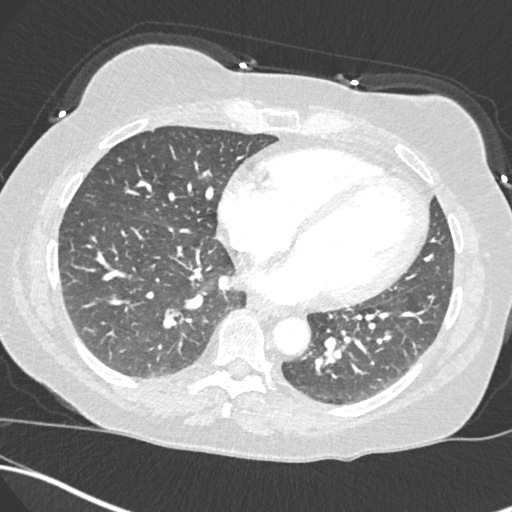
[im 150/288  soft-tissue]
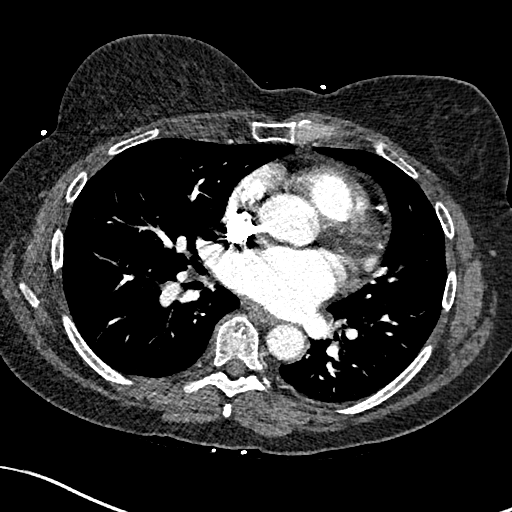
[im 163/288  lung]
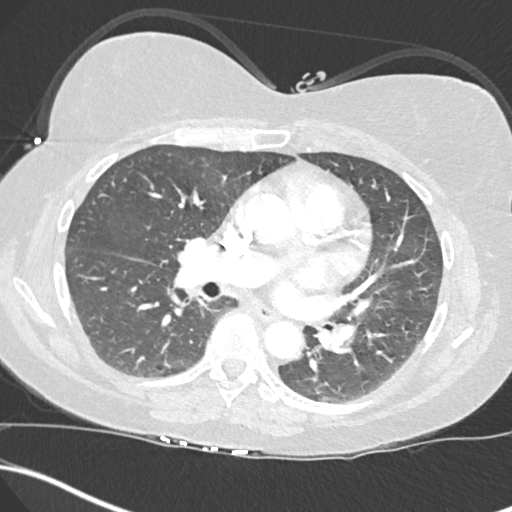
[im 175/288  soft-tissue]
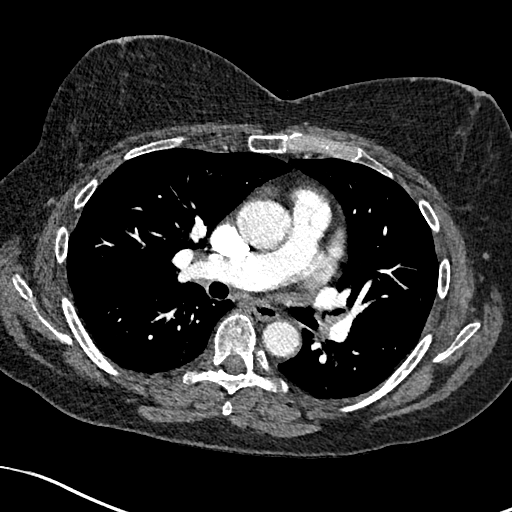
[im 200/288  lung]
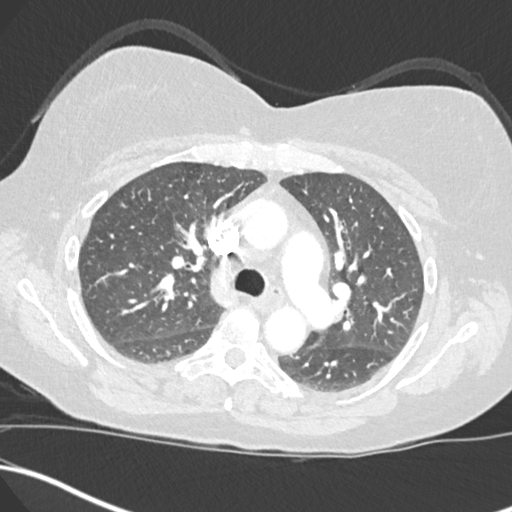
[im 213/288  soft-tissue]
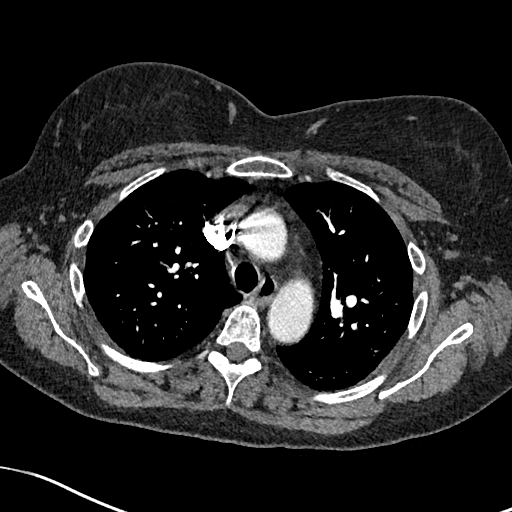
[im 238/288  lung]
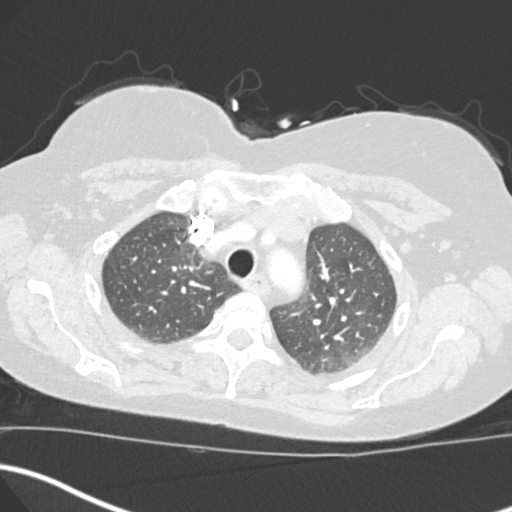
[im 250/288  soft-tissue]
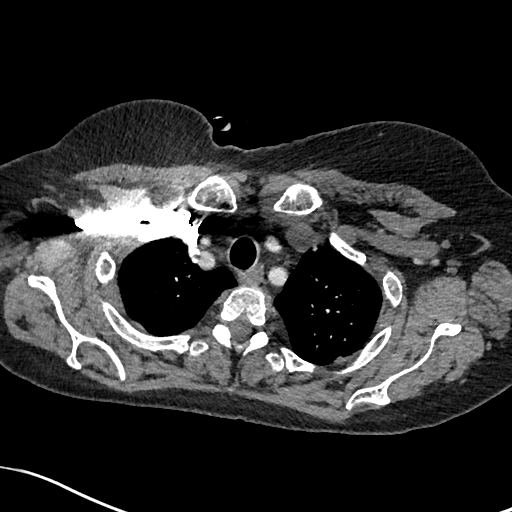
[im 275/288  lung]
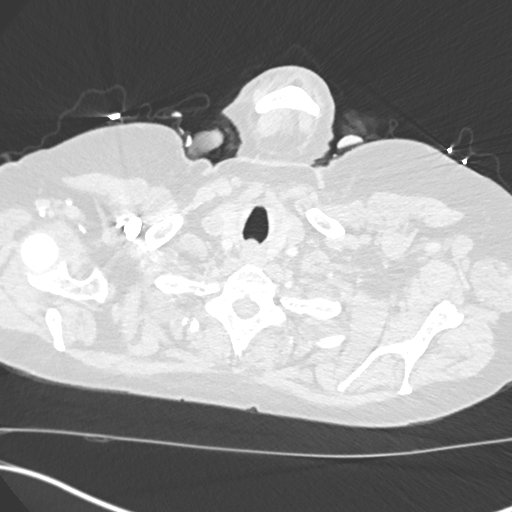

[Series 7: cor soft · coronal · 0.58mm/px · 3 of 151 slices shown]
[im 38/151  soft-tissue]
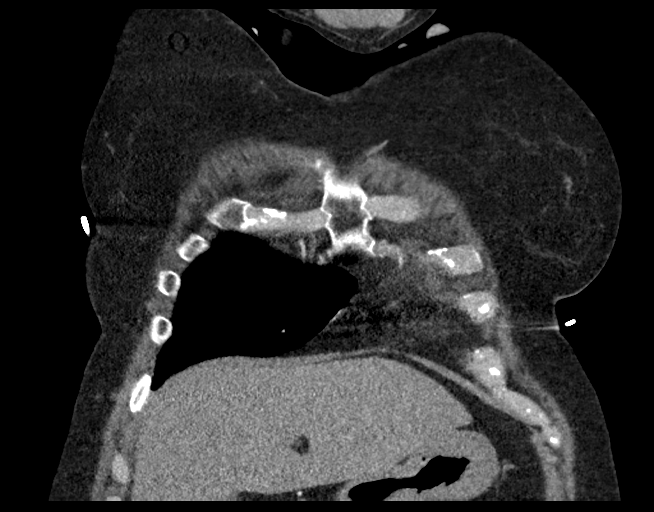
[im 76/151  soft-tissue]
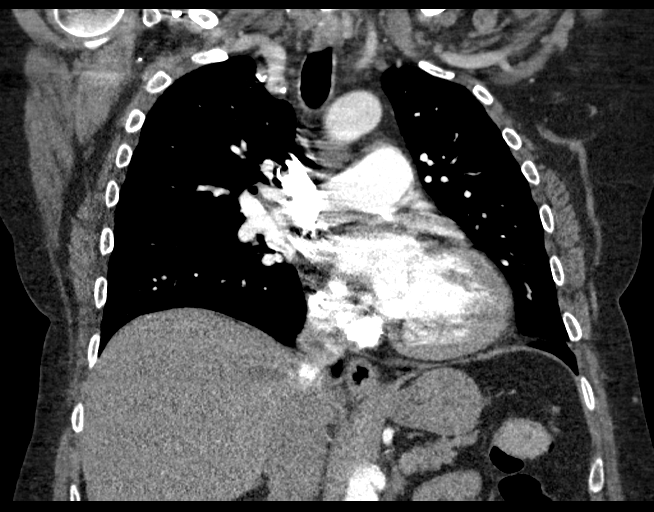
[im 113/151  soft-tissue]
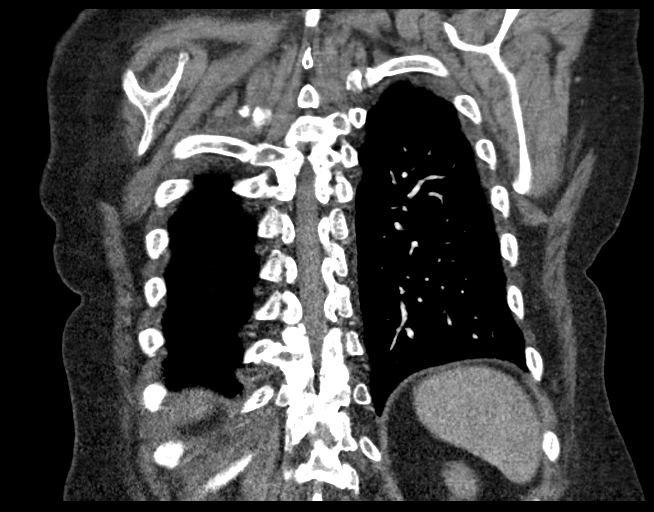

[18 of 46 positions shown; findings below may reference images not displayed]

FINDINGS: Cardiovascular: The pulmonary arteries are well opacified with
contrast to the level of the subsegmental branches. There is no
evidence of acute pulmonary embolism. Diffuse three-vessel coronary
artery atherosclerosis with lesser involvement of the aorta and
great vessels. No acute vascular findings. The heart is mildly
enlarged. No significant pericardial fluid.

Mediastinum/Nodes: There are no enlarged mediastinal, hilar or
axillary lymph nodes. The thyroid gland, trachea and esophagus
demonstrate no significant findings.

Lungs/Pleura: No pleural effusion or pneumothorax. There is mild
central airway thickening. Scattered tiny subpleural nodules in both
lungs are stable. No airspace disease or suspicious pulmonary
nodule.

Upper abdomen: The visualized upper abdomen appears stable without
significant findings.

Musculoskeletal/Chest wall: There is no chest wall mass or
suspicious osseous finding. Old bilateral rib fractures. Mild
thoracic spondylosis.

Review of the MIP images confirms the above findings.
IMPRESSION: 1. No evidence of acute pulmonary embolism or other acute chest
process.
2. Mild central airway thickening in both lungs without focal
airspace disease or hyperinflation.
3. Diffuse three-vessel coronary artery atherosclerosis.
4. Aortic Atherosclerosis ([id number].G).

## 2022-04-25 IMAGING — MG MM DIGITAL SCREENING BILAT W/ TOMO AND CAD
8 of 14 series · 8 of 40 positions shown · non-contrast
Comparison: Previous exam(s).

CLINICAL DATA: Screening.

EXAM:
DIGITAL SCREENING BILATERAL MAMMOGRAM WITH TOMOSYNTHESIS AND CAD
TECHNIQUE: Bilateral screening digital craniocaudal and mediolateral oblique
mammograms were obtained. Bilateral screening digital breast
tomosynthesis was performed. The images were evaluated with
computer-aided detection.

[R CC synth-2D (1 of 2)]
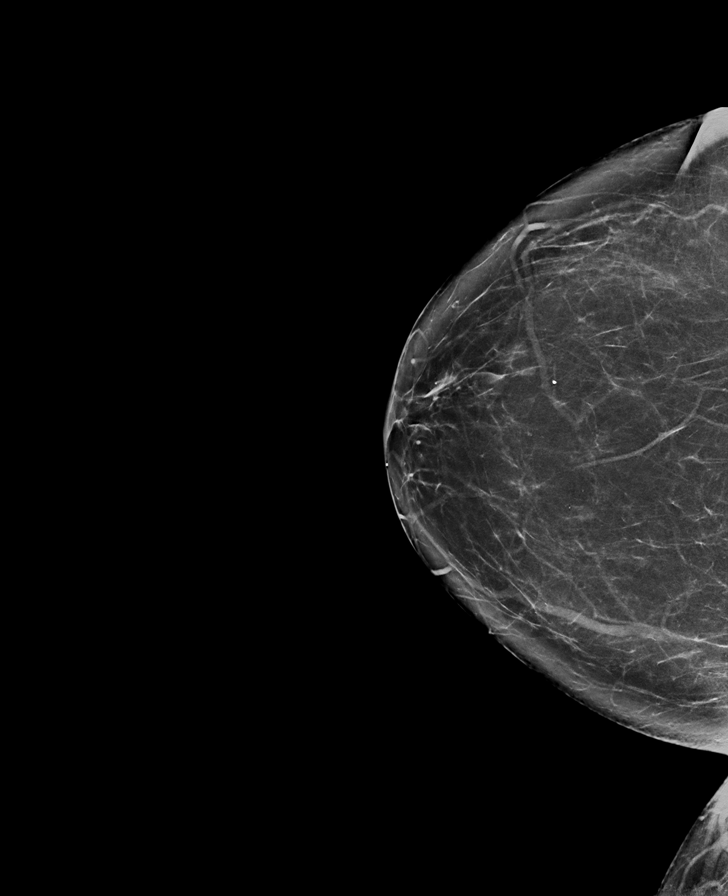

[R CC synth-2D (2 of 2)]
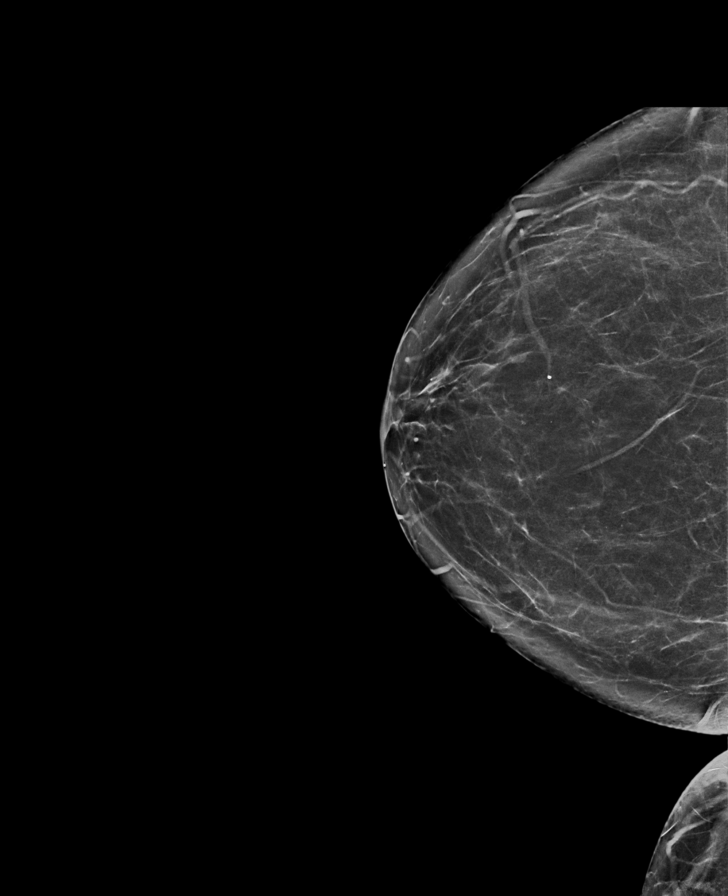

[R MLO synth-2D (1 of 2)]
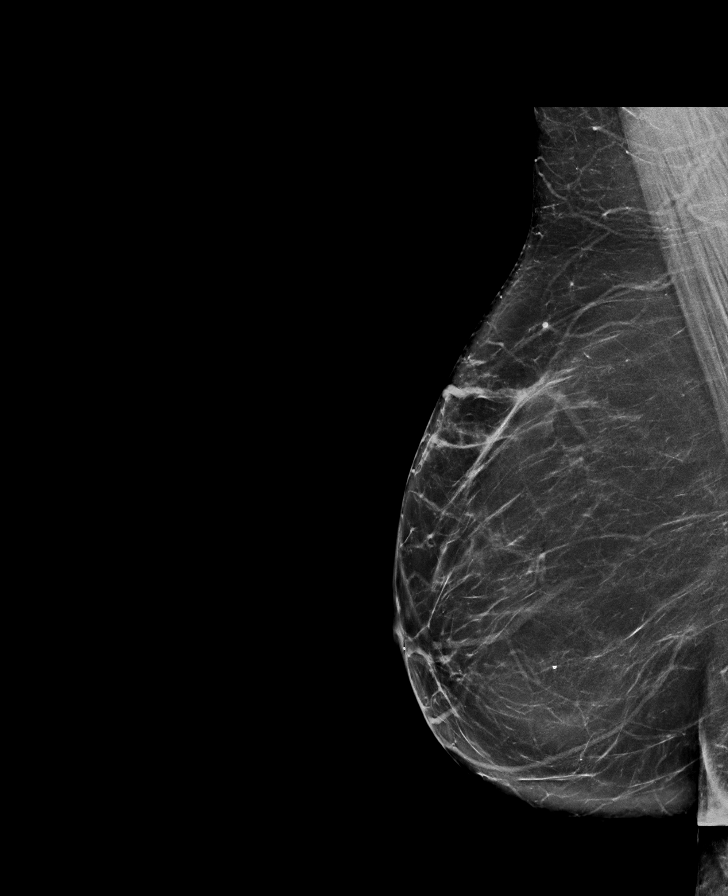

[L MLO synth-2D (1 of 2)]
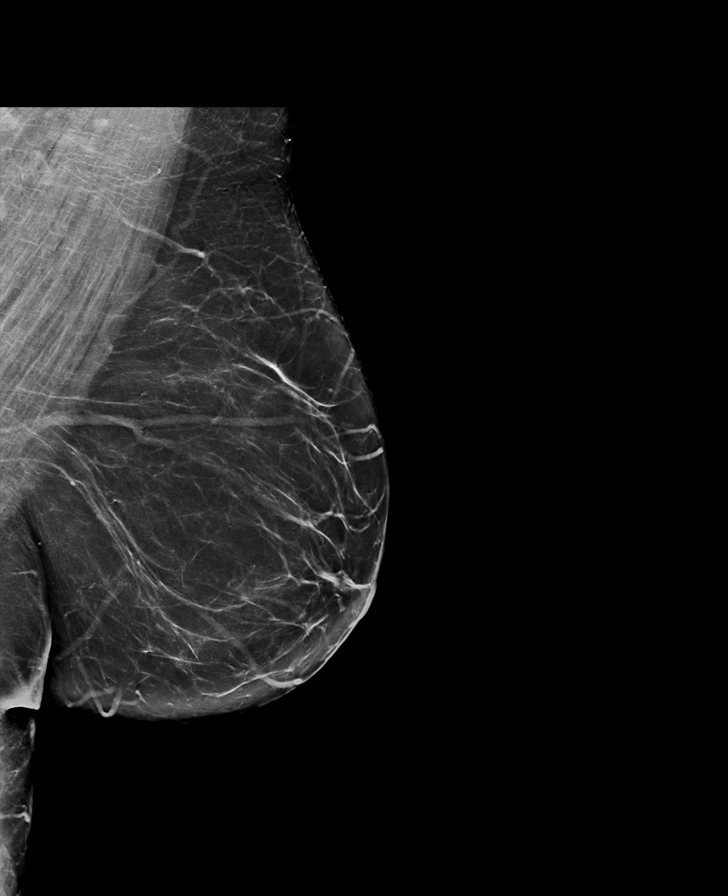

[R MLO synth-2D (2 of 2)]
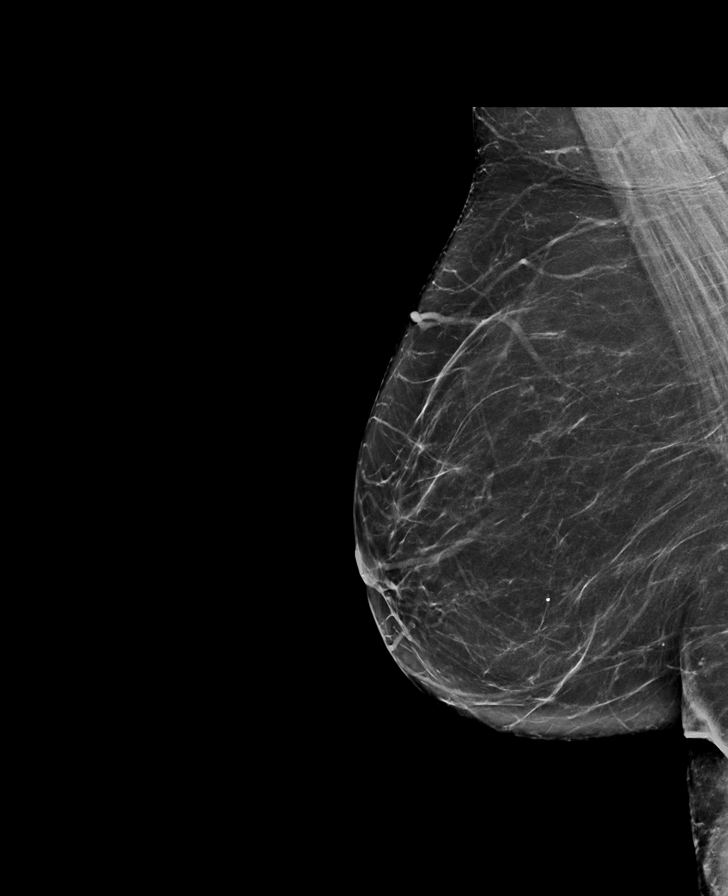

[L MLO synth-2D (2 of 2)]
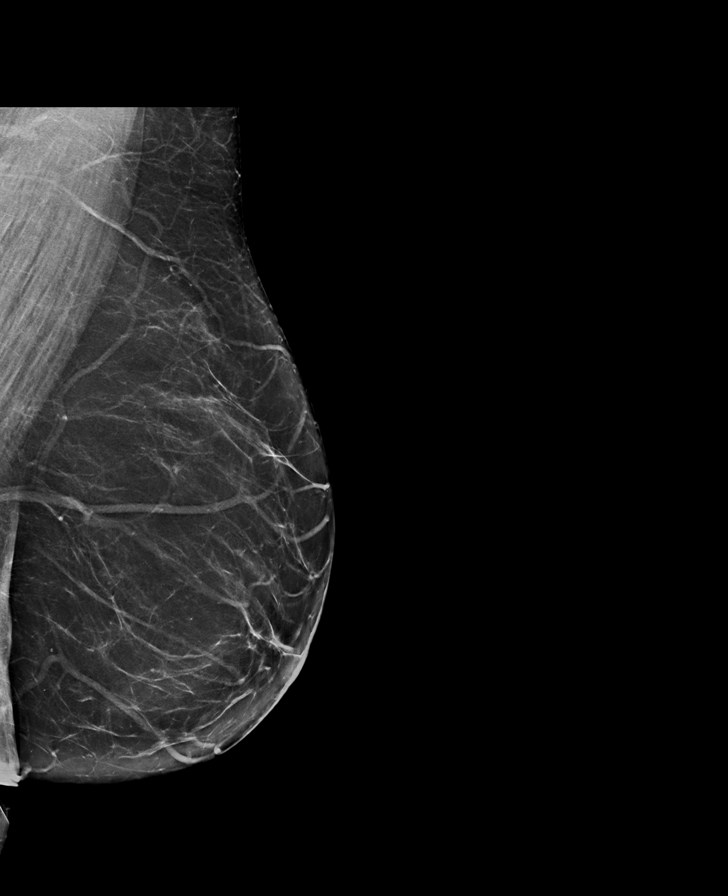

[L CC synth-2D]
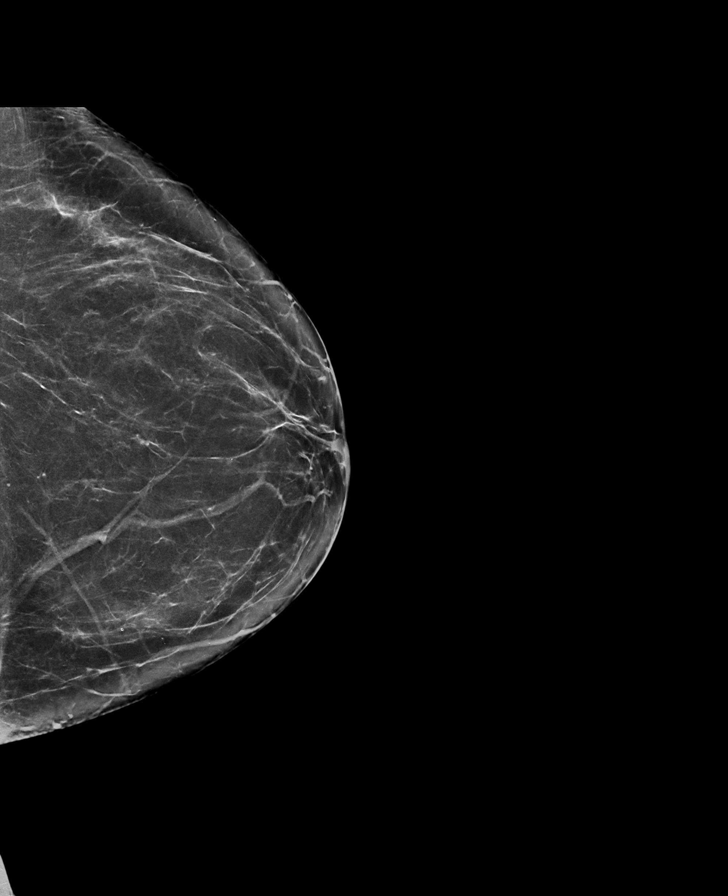

[R MLO tomo · tomo slice 37/73.0]
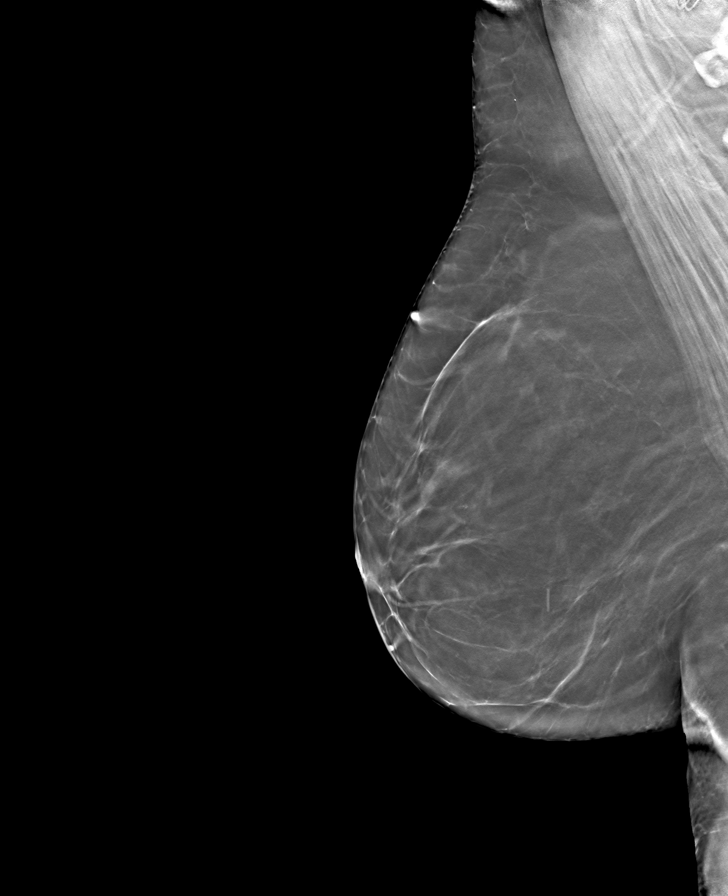

[8 of 40 positions shown; findings below may reference images not displayed]

ACR Breast Density Category b: There are scattered areas of
fibroglandular density.
FINDINGS: There are no findings suspicious for malignancy. The images were
evaluated with computer-aided detection.
IMPRESSION: No mammographic evidence of malignancy. A result letter of this
screening mammogram will be mailed directly to the patient.

RECOMMENDATION:
Screening mammogram in one year. (Code:WJ-[id number])

BI-RADS CATEGORY  1: Negative.
